# Patient Record
Sex: Female | Born: 1935
Health system: Southern US, Community
[De-identification: ages and names within clinical notes are randomized; demographics above are authoritative.]

## PROBLEM LIST (undated history)

## (undated) DIAGNOSIS — J479 Bronchiectasis, uncomplicated: Secondary | ICD-10-CM

## (undated) DIAGNOSIS — Z923 Personal history of irradiation: Secondary | ICD-10-CM

## (undated) DIAGNOSIS — D3A090 Benign carcinoid tumor of the bronchus and lung: Secondary | ICD-10-CM

## (undated) DIAGNOSIS — C50919 Malignant neoplasm of unspecified site of unspecified female breast: Secondary | ICD-10-CM

## (undated) DIAGNOSIS — R569 Unspecified convulsions: Secondary | ICD-10-CM

## (undated) DIAGNOSIS — K635 Polyp of colon: Secondary | ICD-10-CM

## (undated) DIAGNOSIS — K279 Peptic ulcer, site unspecified, unspecified as acute or chronic, without hemorrhage or perforation: Secondary | ICD-10-CM

## (undated) DIAGNOSIS — K649 Unspecified hemorrhoids: Secondary | ICD-10-CM

## (undated) DIAGNOSIS — Z9221 Personal history of antineoplastic chemotherapy: Secondary | ICD-10-CM

## (undated) DIAGNOSIS — J45909 Unspecified asthma, uncomplicated: Secondary | ICD-10-CM

## (undated) DIAGNOSIS — K219 Gastro-esophageal reflux disease without esophagitis: Secondary | ICD-10-CM

## (undated) DIAGNOSIS — I10 Essential (primary) hypertension: Secondary | ICD-10-CM

## (undated) HISTORY — DX: Unspecified asthma, uncomplicated: J45.909

## (undated) HISTORY — DX: Polyp of colon: K63.5

## (undated) HISTORY — DX: Peptic ulcer, site unspecified, unspecified as acute or chronic, without hemorrhage or perforation: K27.9

## (undated) HISTORY — PX: TOTAL VAGINAL HYSTERECTOMY: SHX2548

## (undated) HISTORY — DX: Unspecified convulsions: R56.9

## (undated) HISTORY — PX: VARICOSE VEIN SURGERY: SHX832

## (undated) HISTORY — DX: Unspecified hemorrhoids: K64.9

## (undated) HISTORY — DX: Gastro-esophageal reflux disease without esophagitis: K21.9

## (undated) HISTORY — DX: Essential (primary) hypertension: I10

## (undated) HISTORY — DX: Benign carcinoid tumor of the bronchus and lung: D3A.090

## (undated) HISTORY — DX: Malignant neoplasm of unspecified site of unspecified female breast: C50.919

## (undated) HISTORY — PX: HEMICOLECTOMY: SHX854

## (undated) HISTORY — PX: HEMORRHOID SURGERY: SHX153

---

## 1997-07-01 HISTORY — PX: THORACOTOMY/LOBECTOMY: SHX6116

## 1999-07-02 DIAGNOSIS — D3A090 Benign carcinoid tumor of the bronchus and lung: Secondary | ICD-10-CM

## 1999-07-02 HISTORY — DX: Benign carcinoid tumor of the bronchus and lung: D3A.090

## 2000-07-01 DIAGNOSIS — K635 Polyp of colon: Secondary | ICD-10-CM

## 2000-07-01 HISTORY — DX: Polyp of colon: K63.5

## 2001-07-01 DIAGNOSIS — Z923 Personal history of irradiation: Secondary | ICD-10-CM

## 2001-07-01 DIAGNOSIS — C50919 Malignant neoplasm of unspecified site of unspecified female breast: Secondary | ICD-10-CM

## 2001-07-01 HISTORY — PX: BREAST EXCISIONAL BIOPSY: SUR124

## 2001-07-01 HISTORY — PX: BREAST LUMPECTOMY: SHX2

## 2001-07-01 HISTORY — DX: Personal history of irradiation: Z92.3

## 2001-07-01 HISTORY — DX: Malignant neoplasm of unspecified site of unspecified female breast: C50.919

## 2002-07-01 DIAGNOSIS — Z9221 Personal history of antineoplastic chemotherapy: Secondary | ICD-10-CM

## 2002-07-01 HISTORY — PX: BREAST EXCISIONAL BIOPSY: SUR124

## 2002-07-01 HISTORY — PX: BREAST LUMPECTOMY: SHX2

## 2002-07-01 HISTORY — DX: Personal history of antineoplastic chemotherapy: Z92.21

## 2003-07-03 LAB — HM COLONOSCOPY: HM Colonoscopy: NORMAL

## 2013-04-11 ENCOUNTER — Ambulatory Visit: Payer: Self-pay | Admitting: Internal Medicine

## 2013-04-19 ENCOUNTER — Ambulatory Visit: Payer: Self-pay | Admitting: Physician Assistant

## 2013-04-19 LAB — URINALYSIS, COMPLETE
Bilirubin,UR: NEGATIVE
Glucose,UR: NEGATIVE mg/dL (ref 0–75)
Ketone: NEGATIVE
Ph: 6.5 (ref 4.5–8.0)
WBC UR: >30 /HPF (ref 0–5)

## 2013-05-10 ENCOUNTER — Ambulatory Visit: Payer: Self-pay | Admitting: Internal Medicine

## 2013-06-30 ENCOUNTER — Ambulatory Visit: Payer: Self-pay | Admitting: Hematology and Oncology

## 2013-07-05 ENCOUNTER — Ambulatory Visit: Payer: Self-pay | Admitting: Hematology and Oncology

## 2013-07-05 LAB — COMPREHENSIVE METABOLIC PANEL
Albumin: 3.7 g/dL (ref 3.4–5.0)
Alkaline Phosphatase: 97 U/L
Anion Gap: 9 (ref 7–16)
BUN: 15 mg/dL (ref 7–18)
Bilirubin,Total: 0.5 mg/dL (ref 0.2–1.0)
CHLORIDE: 100 mmol/L (ref 98–107)
CREATININE: 1.14 mg/dL (ref 0.60–1.30)
Calcium, Total: 9.9 mg/dL (ref 8.5–10.1)
Co2: 30 mmol/L (ref 21–32)
GFR CALC AF AMER: 50 — AB
GFR CALC NON AF AMER: 44 — AB
GLUCOSE: 118 mg/dL — AB (ref 65–99)
Osmolality: 279 (ref 275–301)
Potassium: 4.2 mmol/L (ref 3.5–5.1)
SGOT(AST): 31 U/L (ref 15–37)
SGPT (ALT): 39 U/L (ref 12–78)
SODIUM: 139 mmol/L (ref 136–145)
Total Protein: 8.1 g/dL (ref 6.4–8.2)

## 2013-07-05 LAB — CBC CANCER CENTER
BASOS ABS: 0.1 x10 3/mm (ref 0.0–0.1)
BASOS PCT: 0.9 %
Eosinophil #: 0.3 x10 3/mm (ref 0.0–0.7)
Eosinophil %: 5.9 %
HCT: 39.7 % (ref 35.0–47.0)
HGB: 12.8 g/dL (ref 12.0–16.0)
LYMPHS PCT: 31.5 %
Lymphocyte #: 1.7 x10 3/mm (ref 1.0–3.6)
MCH: 25 pg — AB (ref 26.0–34.0)
MCHC: 32.3 g/dL (ref 32.0–36.0)
MCV: 77 fL — AB (ref 80–100)
Monocyte #: 0.4 x10 3/mm (ref 0.2–0.9)
Monocyte %: 7.8 %
NEUTROS PCT: 53.9 %
Neutrophil #: 2.9 x10 3/mm (ref 1.4–6.5)
Platelet: 216 x10 3/mm (ref 150–440)
RBC: 5.15 10*6/uL (ref 3.80–5.20)
RDW: 15.1 % — AB (ref 11.5–14.5)
WBC: 5.4 x10 3/mm (ref 3.6–11.0)

## 2013-07-06 LAB — CANCER ANTIGEN 27.29: CA 27.29: 19 U/mL (ref 0.0–38.6)

## 2013-08-01 ENCOUNTER — Ambulatory Visit: Payer: Self-pay | Admitting: Hematology and Oncology

## 2013-08-09 ENCOUNTER — Ambulatory Visit: Payer: Self-pay | Admitting: Hematology and Oncology

## 2013-08-09 LAB — HM MAMMOGRAPHY: HM Mammogram: NEGATIVE

## 2013-08-29 ENCOUNTER — Ambulatory Visit: Payer: Self-pay | Admitting: Hematology and Oncology

## 2013-09-03 ENCOUNTER — Ambulatory Visit: Payer: Self-pay | Admitting: Family Medicine

## 2013-10-12 DIAGNOSIS — H1045 Other chronic allergic conjunctivitis: Secondary | ICD-10-CM | POA: Diagnosis not present

## 2013-10-13 DIAGNOSIS — K649 Unspecified hemorrhoids: Secondary | ICD-10-CM | POA: Diagnosis not present

## 2013-10-18 DIAGNOSIS — Z5181 Encounter for therapeutic drug level monitoring: Secondary | ICD-10-CM | POA: Diagnosis not present

## 2013-10-19 DIAGNOSIS — R569 Unspecified convulsions: Secondary | ICD-10-CM | POA: Diagnosis not present

## 2013-11-04 DIAGNOSIS — Z5181 Encounter for therapeutic drug level monitoring: Secondary | ICD-10-CM | POA: Diagnosis not present

## 2013-11-04 DIAGNOSIS — K644 Residual hemorrhoidal skin tags: Secondary | ICD-10-CM | POA: Diagnosis not present

## 2013-11-04 DIAGNOSIS — J45909 Unspecified asthma, uncomplicated: Secondary | ICD-10-CM | POA: Diagnosis not present

## 2013-11-04 DIAGNOSIS — Z7901 Long term (current) use of anticoagulants: Secondary | ICD-10-CM | POA: Diagnosis not present

## 2013-11-11 DIAGNOSIS — J45901 Unspecified asthma with (acute) exacerbation: Secondary | ICD-10-CM | POA: Diagnosis not present

## 2013-12-14 DIAGNOSIS — Z5181 Encounter for therapeutic drug level monitoring: Secondary | ICD-10-CM | POA: Diagnosis not present

## 2013-12-15 DIAGNOSIS — H04129 Dry eye syndrome of unspecified lacrimal gland: Secondary | ICD-10-CM | POA: Diagnosis not present

## 2013-12-23 DIAGNOSIS — J45901 Unspecified asthma with (acute) exacerbation: Secondary | ICD-10-CM | POA: Diagnosis not present

## 2013-12-23 DIAGNOSIS — J40 Bronchitis, not specified as acute or chronic: Secondary | ICD-10-CM | POA: Diagnosis not present

## 2013-12-23 DIAGNOSIS — H04129 Dry eye syndrome of unspecified lacrimal gland: Secondary | ICD-10-CM | POA: Diagnosis not present

## 2013-12-23 DIAGNOSIS — I2699 Other pulmonary embolism without acute cor pulmonale: Secondary | ICD-10-CM | POA: Diagnosis not present

## 2013-12-23 DIAGNOSIS — R059 Cough, unspecified: Secondary | ICD-10-CM | POA: Diagnosis not present

## 2013-12-23 DIAGNOSIS — J449 Chronic obstructive pulmonary disease, unspecified: Secondary | ICD-10-CM | POA: Diagnosis not present

## 2013-12-23 DIAGNOSIS — R05 Cough: Secondary | ICD-10-CM | POA: Diagnosis not present

## 2013-12-28 ENCOUNTER — Encounter: Payer: Self-pay | Admitting: Neurology

## 2013-12-28 DIAGNOSIS — M6281 Muscle weakness (generalized): Secondary | ICD-10-CM | POA: Diagnosis not present

## 2013-12-28 DIAGNOSIS — R293 Abnormal posture: Secondary | ICD-10-CM | POA: Diagnosis not present

## 2013-12-28 DIAGNOSIS — R262 Difficulty in walking, not elsewhere classified: Secondary | ICD-10-CM | POA: Diagnosis not present

## 2013-12-29 ENCOUNTER — Encounter: Payer: Self-pay | Admitting: Neurology

## 2014-01-05 DIAGNOSIS — Z5181 Encounter for therapeutic drug level monitoring: Secondary | ICD-10-CM | POA: Diagnosis not present

## 2014-01-11 DIAGNOSIS — I2699 Other pulmonary embolism without acute cor pulmonale: Secondary | ICD-10-CM | POA: Diagnosis not present

## 2014-01-11 DIAGNOSIS — I10 Essential (primary) hypertension: Secondary | ICD-10-CM | POA: Diagnosis not present

## 2014-01-11 LAB — BASIC METABOLIC PANEL
BUN: 12 mg/dL (ref 4–21)
Creatinine: 0.7 mg/dL (ref <=1.1)

## 2014-02-01 DIAGNOSIS — R05 Cough: Secondary | ICD-10-CM | POA: Diagnosis not present

## 2014-02-01 DIAGNOSIS — R9389 Abnormal findings on diagnostic imaging of other specified body structures: Secondary | ICD-10-CM | POA: Diagnosis not present

## 2014-02-01 DIAGNOSIS — J449 Chronic obstructive pulmonary disease, unspecified: Secondary | ICD-10-CM | POA: Diagnosis not present

## 2014-02-01 DIAGNOSIS — R059 Cough, unspecified: Secondary | ICD-10-CM | POA: Diagnosis not present

## 2014-02-01 DIAGNOSIS — J45909 Unspecified asthma, uncomplicated: Secondary | ICD-10-CM | POA: Diagnosis not present

## 2014-02-01 DIAGNOSIS — J310 Chronic rhinitis: Secondary | ICD-10-CM | POA: Diagnosis not present

## 2014-02-15 DIAGNOSIS — L820 Inflamed seborrheic keratosis: Secondary | ICD-10-CM | POA: Diagnosis not present

## 2014-02-22 DIAGNOSIS — H832X9 Labyrinthine dysfunction, unspecified ear: Secondary | ICD-10-CM | POA: Diagnosis not present

## 2014-03-01 DIAGNOSIS — I8390 Asymptomatic varicose veins of unspecified lower extremity: Secondary | ICD-10-CM | POA: Diagnosis not present

## 2014-03-02 DIAGNOSIS — L820 Inflamed seborrheic keratosis: Secondary | ICD-10-CM | POA: Diagnosis not present

## 2014-03-09 DIAGNOSIS — B351 Tinea unguium: Secondary | ICD-10-CM | POA: Diagnosis not present

## 2014-03-21 DIAGNOSIS — H6120 Impacted cerumen, unspecified ear: Secondary | ICD-10-CM | POA: Diagnosis not present

## 2014-03-21 DIAGNOSIS — H903 Sensorineural hearing loss, bilateral: Secondary | ICD-10-CM | POA: Diagnosis not present

## 2014-03-29 DIAGNOSIS — L820 Inflamed seborrheic keratosis: Secondary | ICD-10-CM | POA: Diagnosis not present

## 2014-03-30 ENCOUNTER — Ambulatory Visit: Payer: Self-pay

## 2014-03-30 DIAGNOSIS — I10 Essential (primary) hypertension: Secondary | ICD-10-CM | POA: Diagnosis not present

## 2014-03-30 DIAGNOSIS — Z86718 Personal history of other venous thrombosis and embolism: Secondary | ICD-10-CM | POA: Diagnosis not present

## 2014-03-30 DIAGNOSIS — J45909 Unspecified asthma, uncomplicated: Secondary | ICD-10-CM | POA: Diagnosis not present

## 2014-03-30 DIAGNOSIS — Z79899 Other long term (current) drug therapy: Secondary | ICD-10-CM | POA: Diagnosis not present

## 2014-03-30 DIAGNOSIS — R51 Headache: Secondary | ICD-10-CM | POA: Diagnosis not present

## 2014-03-30 DIAGNOSIS — R569 Unspecified convulsions: Secondary | ICD-10-CM | POA: Diagnosis not present

## 2014-03-30 DIAGNOSIS — Z7901 Long term (current) use of anticoagulants: Secondary | ICD-10-CM | POA: Diagnosis not present

## 2014-03-30 LAB — COMPREHENSIVE METABOLIC PANEL
ALBUMIN: 3.5 g/dL (ref 3.4–5.0)
ALK PHOS: 94 U/L
ANION GAP: 9 (ref 7–16)
BUN: 19 mg/dL — AB (ref 7–18)
Bilirubin,Total: 0.3 mg/dL (ref 0.2–1.0)
CALCIUM: 9.4 mg/dL (ref 8.5–10.1)
CHLORIDE: 99 mmol/L (ref 98–107)
Co2: 29 mmol/L (ref 21–32)
Creatinine: 1.26 mg/dL (ref 0.60–1.30)
EGFR (African American): 52 — ABNORMAL LOW
EGFR (Non-African Amer.): 43 — ABNORMAL LOW
GLUCOSE: 113 mg/dL — AB (ref 65–99)
Osmolality: 277 (ref 275–301)
POTASSIUM: 3.5 mmol/L (ref 3.5–5.1)
SGOT(AST): 24 U/L (ref 15–37)
SGPT (ALT): 30 U/L
SODIUM: 137 mmol/L (ref 136–145)
TOTAL PROTEIN: 8.1 g/dL (ref 6.4–8.2)

## 2014-03-30 LAB — SEDIMENTATION RATE: Erythrocyte Sed Rate: 55 mm/hr — ABNORMAL HIGH (ref 0–30)

## 2014-03-30 LAB — CBC WITH DIFFERENTIAL/PLATELET
BASOS ABS: 0 10*3/uL (ref 0.0–0.1)
BASOS PCT: 0.9 %
EOS ABS: 0.3 10*3/uL (ref 0.0–0.7)
Eosinophil %: 5.5 %
HCT: 39.7 % (ref 35.0–47.0)
HGB: 12.8 g/dL (ref 12.0–16.0)
LYMPHS ABS: 1.8 10*3/uL (ref 1.0–3.6)
Lymphocyte %: 36.7 %
MCH: 25.4 pg — AB (ref 26.0–34.0)
MCHC: 32.3 g/dL (ref 32.0–36.0)
MCV: 79 fL — ABNORMAL LOW (ref 80–100)
MONOS PCT: 8 %
Monocyte #: 0.4 x10 3/mm (ref 0.2–0.9)
Neutrophil #: 2.4 10*3/uL (ref 1.4–6.5)
Neutrophil %: 48.9 %
PLATELETS: 221 10*3/uL (ref 150–440)
RBC: 5.06 10*6/uL (ref 3.80–5.20)
RDW: 15 % — ABNORMAL HIGH (ref 11.5–14.5)
WBC: 4.9 10*3/uL (ref 3.6–11.0)

## 2014-03-31 ENCOUNTER — Ambulatory Visit: Payer: Self-pay

## 2014-03-31 DIAGNOSIS — R51 Headache: Secondary | ICD-10-CM | POA: Diagnosis not present

## 2014-03-31 DIAGNOSIS — G319 Degenerative disease of nervous system, unspecified: Secondary | ICD-10-CM | POA: Diagnosis not present

## 2014-04-12 DIAGNOSIS — G40209 Localization-related (focal) (partial) symptomatic epilepsy and epileptic syndromes with complex partial seizures, not intractable, without status epilepticus: Secondary | ICD-10-CM | POA: Diagnosis not present

## 2014-04-12 DIAGNOSIS — R262 Difficulty in walking, not elsewhere classified: Secondary | ICD-10-CM | POA: Diagnosis not present

## 2014-05-06 DIAGNOSIS — J40 Bronchitis, not specified as acute or chronic: Secondary | ICD-10-CM | POA: Diagnosis not present

## 2014-05-24 DIAGNOSIS — R05 Cough: Secondary | ICD-10-CM | POA: Diagnosis not present

## 2014-05-24 DIAGNOSIS — E6609 Other obesity due to excess calories: Secondary | ICD-10-CM | POA: Diagnosis not present

## 2014-05-24 DIAGNOSIS — J4520 Mild intermittent asthma, uncomplicated: Secondary | ICD-10-CM | POA: Diagnosis not present

## 2014-05-24 DIAGNOSIS — J8410 Pulmonary fibrosis, unspecified: Secondary | ICD-10-CM | POA: Diagnosis not present

## 2014-05-24 DIAGNOSIS — R0781 Pleurodynia: Secondary | ICD-10-CM | POA: Diagnosis not present

## 2014-06-02 DIAGNOSIS — Z23 Encounter for immunization: Secondary | ICD-10-CM | POA: Diagnosis not present

## 2014-06-08 ENCOUNTER — Encounter: Payer: Self-pay | Admitting: Internal Medicine

## 2014-06-08 DIAGNOSIS — H832X3 Labyrinthine dysfunction, bilateral: Secondary | ICD-10-CM | POA: Diagnosis not present

## 2014-06-08 DIAGNOSIS — R269 Unspecified abnormalities of gait and mobility: Secondary | ICD-10-CM | POA: Diagnosis not present

## 2014-06-08 DIAGNOSIS — R262 Difficulty in walking, not elsewhere classified: Secondary | ICD-10-CM | POA: Diagnosis not present

## 2014-06-08 DIAGNOSIS — Z9181 History of falling: Secondary | ICD-10-CM | POA: Diagnosis not present

## 2014-07-01 ENCOUNTER — Encounter: Payer: Self-pay | Admitting: Internal Medicine

## 2014-07-22 ENCOUNTER — Emergency Department: Payer: Self-pay | Admitting: Emergency Medicine

## 2014-07-22 DIAGNOSIS — M25561 Pain in right knee: Secondary | ICD-10-CM | POA: Diagnosis not present

## 2014-07-22 DIAGNOSIS — M79661 Pain in right lower leg: Secondary | ICD-10-CM | POA: Diagnosis not present

## 2014-07-22 DIAGNOSIS — M170 Bilateral primary osteoarthritis of knee: Secondary | ICD-10-CM | POA: Diagnosis not present

## 2014-07-22 DIAGNOSIS — S8001XA Contusion of right knee, initial encounter: Secondary | ICD-10-CM | POA: Diagnosis not present

## 2014-07-22 DIAGNOSIS — M1711 Unilateral primary osteoarthritis, right knee: Secondary | ICD-10-CM | POA: Diagnosis not present

## 2014-07-22 DIAGNOSIS — S8991XA Unspecified injury of right lower leg, initial encounter: Secondary | ICD-10-CM | POA: Diagnosis not present

## 2014-07-27 DIAGNOSIS — L728 Other follicular cysts of the skin and subcutaneous tissue: Secondary | ICD-10-CM | POA: Diagnosis not present

## 2014-07-27 DIAGNOSIS — L723 Sebaceous cyst: Secondary | ICD-10-CM | POA: Diagnosis not present

## 2014-07-27 DIAGNOSIS — D485 Neoplasm of uncertain behavior of skin: Secondary | ICD-10-CM | POA: Diagnosis not present

## 2014-07-27 DIAGNOSIS — L821 Other seborrheic keratosis: Secondary | ICD-10-CM | POA: Diagnosis not present

## 2014-08-11 DIAGNOSIS — G40209 Localization-related (focal) (partial) symptomatic epilepsy and epileptic syndromes with complex partial seizures, not intractable, without status epilepticus: Secondary | ICD-10-CM | POA: Diagnosis not present

## 2014-08-17 ENCOUNTER — Ambulatory Visit: Payer: Self-pay | Admitting: Hematology and Oncology

## 2014-08-17 DIAGNOSIS — G40909 Epilepsy, unspecified, not intractable, without status epilepticus: Secondary | ICD-10-CM | POA: Diagnosis not present

## 2014-08-17 DIAGNOSIS — I10 Essential (primary) hypertension: Secondary | ICD-10-CM | POA: Diagnosis not present

## 2014-08-17 DIAGNOSIS — Z86718 Personal history of other venous thrombosis and embolism: Secondary | ICD-10-CM | POA: Diagnosis not present

## 2014-08-17 DIAGNOSIS — R5383 Other fatigue: Secondary | ICD-10-CM | POA: Diagnosis not present

## 2014-08-17 DIAGNOSIS — G629 Polyneuropathy, unspecified: Secondary | ICD-10-CM | POA: Diagnosis not present

## 2014-08-17 DIAGNOSIS — M4800 Spinal stenosis, site unspecified: Secondary | ICD-10-CM | POA: Diagnosis not present

## 2014-08-17 DIAGNOSIS — Z9223 Personal history of estrogen therapy: Secondary | ICD-10-CM | POA: Diagnosis not present

## 2014-08-17 DIAGNOSIS — Z853 Personal history of malignant neoplasm of breast: Secondary | ICD-10-CM | POA: Diagnosis not present

## 2014-08-17 DIAGNOSIS — R05 Cough: Secondary | ICD-10-CM | POA: Diagnosis not present

## 2014-08-17 DIAGNOSIS — R531 Weakness: Secondary | ICD-10-CM | POA: Diagnosis not present

## 2014-08-17 DIAGNOSIS — Z85110 Personal history of malignant carcinoid tumor of bronchus and lung: Secondary | ICD-10-CM | POA: Diagnosis not present

## 2014-08-17 DIAGNOSIS — Z923 Personal history of irradiation: Secondary | ICD-10-CM | POA: Diagnosis not present

## 2014-08-17 DIAGNOSIS — Z170 Estrogen receptor positive status [ER+]: Secondary | ICD-10-CM | POA: Diagnosis not present

## 2014-08-17 DIAGNOSIS — Z87891 Personal history of nicotine dependence: Secondary | ICD-10-CM | POA: Diagnosis not present

## 2014-08-17 DIAGNOSIS — Z7901 Long term (current) use of anticoagulants: Secondary | ICD-10-CM | POA: Diagnosis not present

## 2014-08-17 DIAGNOSIS — Z86711 Personal history of pulmonary embolism: Secondary | ICD-10-CM | POA: Diagnosis not present

## 2014-08-17 DIAGNOSIS — Z79899 Other long term (current) drug therapy: Secondary | ICD-10-CM | POA: Diagnosis not present

## 2014-08-20 ENCOUNTER — Ambulatory Visit: Payer: Self-pay

## 2014-08-20 DIAGNOSIS — G40909 Epilepsy, unspecified, not intractable, without status epilepticus: Secondary | ICD-10-CM | POA: Diagnosis not present

## 2014-08-20 DIAGNOSIS — R319 Hematuria, unspecified: Secondary | ICD-10-CM | POA: Diagnosis not present

## 2014-08-20 DIAGNOSIS — N390 Urinary tract infection, site not specified: Secondary | ICD-10-CM | POA: Diagnosis not present

## 2014-08-20 DIAGNOSIS — Z79899 Other long term (current) drug therapy: Secondary | ICD-10-CM | POA: Diagnosis not present

## 2014-08-20 DIAGNOSIS — I10 Essential (primary) hypertension: Secondary | ICD-10-CM | POA: Diagnosis not present

## 2014-08-20 DIAGNOSIS — K279 Peptic ulcer, site unspecified, unspecified as acute or chronic, without hemorrhage or perforation: Secondary | ICD-10-CM | POA: Diagnosis not present

## 2014-08-30 ENCOUNTER — Ambulatory Visit: Payer: Self-pay | Admitting: Hematology and Oncology

## 2014-08-30 ENCOUNTER — Ambulatory Visit
Admit: 2014-08-30 | Disposition: A | Discharge status: Home / Self Care | Payer: Self-pay | Attending: Hematology and Oncology | Admitting: Hematology and Oncology

## 2014-08-30 DIAGNOSIS — Z1231 Encounter for screening mammogram for malignant neoplasm of breast: Secondary | ICD-10-CM | POA: Diagnosis not present

## 2014-09-08 ENCOUNTER — Ambulatory Visit: Payer: Self-pay | Admitting: Internal Medicine

## 2014-09-08 DIAGNOSIS — M25561 Pain in right knee: Secondary | ICD-10-CM | POA: Diagnosis not present

## 2014-09-08 DIAGNOSIS — M25461 Effusion, right knee: Secondary | ICD-10-CM | POA: Diagnosis not present

## 2014-09-08 DIAGNOSIS — M1711 Unilateral primary osteoarthritis, right knee: Secondary | ICD-10-CM | POA: Diagnosis not present

## 2014-10-11 ENCOUNTER — Ambulatory Visit
Admit: 2014-10-11 | Disposition: A | Discharge status: Home / Self Care | Payer: Self-pay | Attending: Family Medicine | Admitting: Family Medicine

## 2014-10-11 DIAGNOSIS — K529 Noninfective gastroenteritis and colitis, unspecified: Secondary | ICD-10-CM | POA: Diagnosis not present

## 2014-10-11 DIAGNOSIS — G40909 Epilepsy, unspecified, not intractable, without status epilepticus: Secondary | ICD-10-CM | POA: Diagnosis not present

## 2014-10-11 DIAGNOSIS — I10 Essential (primary) hypertension: Secondary | ICD-10-CM | POA: Diagnosis not present

## 2014-10-11 LAB — BASIC METABOLIC PANEL
Anion Gap: 10 (ref 7–16)
BUN: 26 mg/dL — AB
CHLORIDE: 104 mmol/L
Calcium, Total: 9.5 mg/dL
Co2: 24 mmol/L
Creatinine: 1.13 mg/dL — ABNORMAL HIGH
EGFR (Non-African Amer.): 43 — ABNORMAL LOW
GFR CALC AF AMER: 50 — AB
GLUCOSE: 145 mg/dL — AB
Potassium: 4.3 mmol/L
Sodium: 138 mmol/L

## 2014-11-08 ENCOUNTER — Encounter: Payer: Self-pay | Admitting: Internal Medicine

## 2014-11-08 DIAGNOSIS — I6789 Other cerebrovascular disease: Secondary | ICD-10-CM

## 2014-11-08 DIAGNOSIS — G40909 Epilepsy, unspecified, not intractable, without status epilepticus: Secondary | ICD-10-CM | POA: Insufficient documentation

## 2014-11-08 DIAGNOSIS — J4520 Mild intermittent asthma, uncomplicated: Secondary | ICD-10-CM | POA: Insufficient documentation

## 2014-11-08 DIAGNOSIS — I8390 Asymptomatic varicose veins of unspecified lower extremity: Secondary | ICD-10-CM | POA: Insufficient documentation

## 2014-11-08 DIAGNOSIS — M25569 Pain in unspecified knee: Secondary | ICD-10-CM | POA: Insufficient documentation

## 2014-11-08 DIAGNOSIS — Z853 Personal history of malignant neoplasm of breast: Secondary | ICD-10-CM | POA: Insufficient documentation

## 2014-11-08 DIAGNOSIS — I10 Essential (primary) hypertension: Secondary | ICD-10-CM | POA: Insufficient documentation

## 2014-11-08 DIAGNOSIS — J479 Bronchiectasis, uncomplicated: Secondary | ICD-10-CM | POA: Insufficient documentation

## 2014-11-08 DIAGNOSIS — I2699 Other pulmonary embolism without acute cor pulmonale: Secondary | ICD-10-CM | POA: Insufficient documentation

## 2014-11-08 DIAGNOSIS — R2689 Other abnormalities of gait and mobility: Secondary | ICD-10-CM | POA: Insufficient documentation

## 2014-11-08 DIAGNOSIS — Z85110 Personal history of malignant carcinoid tumor of bronchus and lung: Secondary | ICD-10-CM | POA: Insufficient documentation

## 2014-11-08 DIAGNOSIS — Z8711 Personal history of peptic ulcer disease: Secondary | ICD-10-CM | POA: Insufficient documentation

## 2014-11-11 DIAGNOSIS — J4530 Mild persistent asthma, uncomplicated: Secondary | ICD-10-CM | POA: Diagnosis not present

## 2014-11-11 DIAGNOSIS — R918 Other nonspecific abnormal finding of lung field: Secondary | ICD-10-CM | POA: Diagnosis not present

## 2014-11-11 DIAGNOSIS — K219 Gastro-esophageal reflux disease without esophagitis: Secondary | ICD-10-CM | POA: Diagnosis not present

## 2014-11-11 DIAGNOSIS — J310 Chronic rhinitis: Secondary | ICD-10-CM | POA: Diagnosis not present

## 2014-12-12 ENCOUNTER — Ambulatory Visit: Payer: Self-pay | Admitting: Internal Medicine

## 2014-12-13 ENCOUNTER — Telehealth: Payer: Self-pay

## 2014-12-13 MED ORDER — HYDROCORTISONE 1 % EX CREA: 1.0000 "application " | TOPICAL_CREAM | Freq: Two times a day (BID) | CUTANEOUS | Status: DC

## 2014-12-13 NOTE — Telephone Encounter (Signed)
Rx sent to pharmacy   

## 2014-12-13 NOTE — Telephone Encounter (Signed)
Patient needs cortisone cream for hemorrhoid to pharmacy

## 2015-01-10 DIAGNOSIS — R32 Unspecified urinary incontinence: Secondary | ICD-10-CM | POA: Diagnosis not present

## 2015-01-10 DIAGNOSIS — K649 Unspecified hemorrhoids: Secondary | ICD-10-CM | POA: Diagnosis not present

## 2015-01-10 DIAGNOSIS — R159 Full incontinence of feces: Secondary | ICD-10-CM | POA: Diagnosis not present

## 2015-01-16 ENCOUNTER — Ambulatory Visit (INDEPENDENT_AMBULATORY_CARE_PROVIDER_SITE_OTHER): Payer: Medicare Other | Admitting: Internal Medicine

## 2015-01-16 ENCOUNTER — Encounter: Payer: Self-pay | Admitting: Internal Medicine

## 2015-01-16 VITALS — BP 120/64 | HR 76 | Ht 63.5 in | Wt 175.6 lb

## 2015-01-16 DIAGNOSIS — R300 Dysuria: Secondary | ICD-10-CM | POA: Diagnosis not present

## 2015-01-16 LAB — POC URINALYSIS WITH MICROSCOPIC (NON AUTO)MANUAL RESULT
Bilirubin, UA: NEGATIVE
Blood, UA: NEGATIVE
Crystals: 0
Epithelial cells, urine per micros: 0
Glucose, UA: NEGATIVE
KETONES UA: NEGATIVE
Mucus, UA: 0
Nitrite, UA: NEGATIVE
Protein, UA: NEGATIVE
Spec Grav, UA: 1.01
UROBILINOGEN UA: 0.2
WBC CASTS UA: 15
pH, UA: 7

## 2015-01-16 MED ORDER — CEFUROXIME AXETIL 250 MG PO TABS: 250.0000 mg | ORAL_TABLET | Freq: Two times a day (BID) | ORAL | Status: DC

## 2015-01-16 NOTE — Progress Notes (Signed)
Date:  01/16/2015   Name:  Rebecca Wolf   DOB:  06-21-1925   MRN:  [id number]   Chief Complaint: Urinary Tract Infection Urinary Tract Infection  This is a new problem. The current episode started in the past 7 days. The problem occurs every urination. The problem has been unchanged. The pain is mild. There has been no fever. Associated symptoms include frequency, nausea, sweats, urgency and vomiting. Pertinent negatives include no chills, discharge, flank pain or hematuria. Treatments tried: given macrodantin by GI but it caused diarrhea. The treatment provided no relief.     Review of Systems:  Review of Systems  Constitutional: Negative for fever, chills and fatigue.  Gastrointestinal: Positive for nausea and vomiting. Negative for abdominal pain and abdominal distention.  Genitourinary: Positive for dysuria, urgency and frequency. Negative for hematuria and flank pain.    Patient Active Problem List   Diagnosis Date Noted  . Imbalance 11/08/2014  . Bronchiectasis 11/08/2014  . Essential (primary) hypertension 11/08/2014  . Personal history of malignant neoplasm of breast 11/08/2014  . History of malignant neoplasm of thoracic cavity structure 11/08/2014  . H/O peptic ulcer 11/08/2014  . Asthma, mild intermittent 11/08/2014  . PE (pulmonary embolism) 11/08/2014  . Gonalgia 11/08/2014  . Cerebral seizure 11/08/2014  . Leg varices 11/08/2014  . Complex partial epilepsy 04/12/2014    Prior to Admission medications   Medication Sig Start Date End Date Taking? Authorizing Provider  albuterol (ACCUNEB) 1.25 MG/3ML nebulizer solution Inhale 1 ampule into the lungs 4 (four) times daily as needed. 05/06/14  Yes Historical Provider, MD  albuterol (PROVENTIL HFA;VENTOLIN HFA) 108 (90 BASE) MCG/ACT inhaler Inhale 1 puff into the lungs 4 (four) times daily.   Yes Historical Provider, MD  amLODipine (NORVASC) 5 MG tablet Take 1 tablet by mouth daily. 04/04/14  Yes Historical Provider, MD   apixaban (ELIQUIS) 2.5 MG TABS tablet Take 1 tablet by mouth 2 (two) times daily. 07/18/14  Yes Historical Provider, MD  fluticasone (FLONASE) 50 MCG/ACT nasal spray Place 2 sprays into the nose daily.   Yes Historical Provider, MD  Fluticasone-Salmeterol (ADVAIR) 500-50 MCG/DOSE AEPB Inhale 1 puff into the lungs 2 (two) times daily.   Yes Historical Provider, MD  hydrocortisone cream 1 % Apply 1 application topically 2 (two) times daily. 12/13/14  Yes Glean Hess, MD  levETIRAcetam (KEPPRA) 750 MG tablet Take 1 tablet by mouth 2 (two) times daily. 06/29/13  Yes Historical Provider, MD  montelukast (SINGULAIR) 10 MG tablet Take 1 tablet by mouth daily.   Yes Historical Provider, MD  pantoprazole (PROTONIX) 40 MG tablet Take 1 tablet by mouth daily. 07/17/14  Yes Historical Provider, MD  spironolactone-hydrochlorothiazide (ALDACTAZIDE) 25-25 MG per tablet Take 1 tablet by mouth daily. 08/04/14  Yes Historical Provider, MD  Olopatadine HCl 0.6 % SOLN Place 1 spray into the nose daily.    Historical Provider, MD    Allergies  Allergen Reactions  . Levofloxacin   . Sulfa Antibiotics     Past Surgical History  Procedure Laterality Date  . Thoracotomy/lobectomy  1999    carcinoid tumor  . Hemicolectomy Right     bowel obstruction  . Total vaginal hysterectomy    . Varicose vein surgery    . Hemorrhoid surgery    . Breast lumpectomy Left 2004/2005    cancer    History  Substance Use Topics  . Smoking status: Never Smoker   . Smokeless tobacco: Not on file  . Alcohol Use: Not  on file     Medication list has been reviewed and updated.  Physical Examination:  Physical Exam  Constitutional: She appears well-developed and well-nourished. No distress.  Cardiovascular: Normal rate and regular rhythm.   Pulmonary/Chest: Effort normal and breath sounds normal. She has no wheezes.  Abdominal: Soft. There is no tenderness. There is no rebound and no guarding.    BP 120/64 mmHg   Pulse 76  Ht 5' 3.5" (1.613 m)  Wt 175 lb 9.6 oz (79.652 kg)  BMI 30.61 kg/m2  Assessment and Plan: 1. Dysuria Continue increased fluid intake Begin ceftine Await Culture for guidance if needed - POC urinalysis w microscopic (non auto) - Urine Culture - cefUROXime (CEFTIN) 250 MG tablet; Take 1 tablet (250 mg total) by mouth 2 (two) times daily with a meal.  Dispense: 14 tablet; Refill: 0   Halina Maidens, MD [facility]  01/16/2015

## 2015-01-18 ENCOUNTER — Other Ambulatory Visit: Payer: Self-pay | Admitting: Internal Medicine

## 2015-01-18 LAB — URINE CULTURE

## 2015-01-18 MED ORDER — AMPICILLIN 500 MG PO CAPS: 500.0000 mg | ORAL_CAPSULE | Freq: Four times a day (QID) | ORAL | Status: DC

## 2015-01-26 ENCOUNTER — Other Ambulatory Visit: Payer: Self-pay | Admitting: Internal Medicine

## 2015-01-30 DIAGNOSIS — R159 Full incontinence of feces: Secondary | ICD-10-CM | POA: Diagnosis not present

## 2015-02-06 ENCOUNTER — Other Ambulatory Visit: Payer: Self-pay

## 2015-02-06 MED ORDER — SPIRONOLACTONE-HCTZ 25-25 MG PO TABS: 1.0000 | ORAL_TABLET | Freq: Every day | ORAL | Status: DC

## 2015-02-13 ENCOUNTER — Telehealth: Payer: Self-pay

## 2015-02-13 NOTE — Telephone Encounter (Signed)
[name] thinks Aniesa has thrush, does not rinse after advair, can you send her something or does she need to be seen.dr

## 2015-02-14 ENCOUNTER — Other Ambulatory Visit: Payer: Self-pay | Admitting: Internal Medicine

## 2015-02-14 MED ORDER — NYSTATIN 100000 UNIT/ML MT SUSP: 5.0000 mL | Freq: Four times a day (QID) | OROMUCOSAL | Status: DC

## 2015-02-14 NOTE — Telephone Encounter (Signed)
Daughter informed.dr

## 2015-02-14 NOTE — Telephone Encounter (Signed)
I will send Rx for nystatin suspension.

## 2015-02-16 DIAGNOSIS — R159 Full incontinence of feces: Secondary | ICD-10-CM | POA: Diagnosis not present

## 2015-02-17 ENCOUNTER — Encounter: Payer: Self-pay | Admitting: Family Medicine

## 2015-02-17 ENCOUNTER — Ambulatory Visit (INDEPENDENT_AMBULATORY_CARE_PROVIDER_SITE_OTHER): Payer: Medicare Other | Admitting: Family Medicine

## 2015-02-17 ENCOUNTER — Other Ambulatory Visit: Payer: Self-pay | Admitting: Family Medicine

## 2015-02-17 ENCOUNTER — Ambulatory Visit: Payer: Self-pay

## 2015-02-17 ENCOUNTER — Ambulatory Visit
Admission: RE | Admit: 2015-02-17 | Discharge: 2015-02-17 | Disposition: A | Discharge status: Home / Self Care | Payer: Medicare Other | Source: Ambulatory Visit | Attending: Family Medicine | Admitting: Family Medicine

## 2015-02-17 VITALS — BP 130/78 | HR 80 | Ht 63.5 in | Wt 176.0 lb

## 2015-02-17 DIAGNOSIS — R600 Localized edema: Secondary | ICD-10-CM | POA: Diagnosis not present

## 2015-02-17 DIAGNOSIS — I10 Essential (primary) hypertension: Secondary | ICD-10-CM | POA: Diagnosis not present

## 2015-02-17 DIAGNOSIS — R2242 Localized swelling, mass and lump, left lower limb: Secondary | ICD-10-CM

## 2015-02-17 NOTE — Progress Notes (Signed)
Date:  02/17/2015   Name:  Rebecca Wolf   DOB:  Mar 31, 1925   MRN:  [id number]  PCP:  Halina Maidens, MD    Chief Complaint: Leg Swelling   History of Present Illness:  This is a 81 y.o. female with increased LLE edema past 2d, hx of LLE DVT in 2000 and 2002, switched from warfarin to Eliquis one year ago. Some chronic DOE, no CP, no hemoptysis.  Review of Systems:  Review of Systems  Patient Active Problem List   Diagnosis Date Noted  . Imbalance 11/08/2014  . Bronchiectasis 11/08/2014  . Essential (primary) hypertension 11/08/2014  . Personal history of malignant neoplasm of breast 11/08/2014  . History of malignant neoplasm of thoracic cavity structure 11/08/2014  . H/O peptic ulcer 11/08/2014  . Asthma, mild intermittent 11/08/2014  . PE (pulmonary embolism) 11/08/2014  . Gonalgia 11/08/2014  . Cerebral seizure 11/08/2014  . Leg varices 11/08/2014  . Complex partial epilepsy 04/12/2014    Prior to Admission medications   Medication Sig Start Date End Date Taking? Authorizing Provider  albuterol (ACCUNEB) 1.25 MG/3ML nebulizer solution Inhale 1 ampule into the lungs 4 (four) times daily as needed. 05/06/14  Yes Historical Provider, MD  albuterol (PROVENTIL HFA;VENTOLIN HFA) 108 (90 BASE) MCG/ACT inhaler Inhale 1 puff into the lungs 4 (four) times daily.   Yes Historical Provider, MD  amLODipine (NORVASC) 5 MG tablet Take 1 tablet by mouth daily. 04/04/14  Yes Historical Provider, MD  ELIQUIS 2.5 MG TABS tablet TAKE 1 TABLET BY MOUTH TWICE DAILY 01/27/15  Yes Glean Hess, MD  fluticasone [facility]) 50 MCG/ACT nasal spray Place 2 sprays into the nose daily.   Yes Historical Provider, MD  Fluticasone-Salmeterol (ADVAIR) 500-50 MCG/DOSE AEPB Inhale 1 puff into the lungs 2 (two) times daily.   Yes Historical Provider, MD  hydrocortisone cream 1 % Apply 1 application topically 2 (two) times daily. 12/13/14  Yes Glean Hess, MD  levETIRAcetam (KEPPRA) 750 MG tablet Take 1  tablet by mouth 2 (two) times daily. 06/29/13  Yes Historical Provider, MD  montelukast (SINGULAIR) 10 MG tablet Take 1 tablet by mouth daily.   Yes Historical Provider, MD  nystatin (MYCOSTATIN) 100000 UNIT/ML suspension Take 5 mLs (500,000 Units total) by mouth 4 (four) times daily. Swish, gargle and spit 4 times per day for one week. 02/14/15  Yes Glean Hess, MD  Olopatadine HCl 0.6 % SOLN Place 1 spray into the nose daily.   Yes Historical Provider, MD  pantoprazole (PROTONIX) 40 MG tablet Take 1 tablet by mouth daily. 07/17/14  Yes Historical Provider, MD  spironolactone-hydrochlorothiazide (ALDACTAZIDE) 25-25 MG per tablet Take 1 tablet by mouth daily. 02/06/15  Yes Glean Hess, MD    Allergies  Allergen Reactions  . Levofloxacin   . Sulfa Antibiotics     Past Surgical History  Procedure Laterality Date  . Thoracotomy/lobectomy  1999    carcinoid tumor  . Hemicolectomy Right     bowel obstruction  . Total vaginal hysterectomy    . Varicose vein surgery    . Hemorrhoid surgery    . Breast lumpectomy Left 2004/2005    cancer    Social History  Substance Use Topics  . Smoking status: Never Smoker   . Smokeless tobacco: None  . Alcohol Use: None    Family History  Problem Relation Age of Onset  . Diabetes Sister   . Alcoholism Father     Medication list has been reviewed and  updated.  Physical Examination: BP 130/78 mmHg  Pulse 80  Ht 5' 3.5" (1.613 m)  Wt 176 lb (79.833 kg)  BMI 30.68 kg/m2  SpO2 94%  Physical Exam  Cardiovascular: Normal rate, regular rhythm and normal heart sounds.   Pulmonary/Chest: Effort normal and breath sounds normal.  Musculoskeletal:  LLE 2+ edema with mild calf tenderness  RLE 1+ edema, no calf tenderness    Assessment and Plan:  1. Leg edema, left Doppler today given hx recurrent DVT - US Venous Img Lower Unilateral Left; Future  2. Essential (primary) hypertension Well controlled, continue current  regimen   Return if symptoms worsen or fail to improve.  Satira Anis. [facility]  02/17/2015

## 2015-04-17 ENCOUNTER — Other Ambulatory Visit: Payer: Self-pay | Admitting: Internal Medicine

## 2015-04-18 ENCOUNTER — Ambulatory Visit: Payer: Self-pay | Admitting: Internal Medicine

## 2015-04-25 ENCOUNTER — Other Ambulatory Visit: Payer: Self-pay | Admitting: Internal Medicine

## 2015-04-25 ENCOUNTER — Ambulatory Visit: Payer: Self-pay | Admitting: Internal Medicine

## 2015-05-17 DIAGNOSIS — J449 Chronic obstructive pulmonary disease, unspecified: Secondary | ICD-10-CM | POA: Diagnosis not present

## 2015-05-17 DIAGNOSIS — R05 Cough: Secondary | ICD-10-CM | POA: Diagnosis not present

## 2015-05-17 DIAGNOSIS — J45909 Unspecified asthma, uncomplicated: Secondary | ICD-10-CM | POA: Diagnosis not present

## 2015-05-17 DIAGNOSIS — J310 Chronic rhinitis: Secondary | ICD-10-CM | POA: Diagnosis not present

## 2015-06-05 ENCOUNTER — Ambulatory Visit: Payer: Self-pay | Admitting: Internal Medicine

## 2015-06-05 DIAGNOSIS — J4530 Mild persistent asthma, uncomplicated: Secondary | ICD-10-CM | POA: Diagnosis not present

## 2015-06-22 DIAGNOSIS — J4520 Mild intermittent asthma, uncomplicated: Secondary | ICD-10-CM | POA: Diagnosis not present

## 2015-06-30 ENCOUNTER — Encounter: Payer: Self-pay | Admitting: Internal Medicine

## 2015-06-30 ENCOUNTER — Ambulatory Visit (INDEPENDENT_AMBULATORY_CARE_PROVIDER_SITE_OTHER): Payer: Medicare Other | Admitting: Internal Medicine

## 2015-06-30 VITALS — BP 110/62 | HR 72 | Ht 63.5 in | Wt 173.0 lb

## 2015-06-30 DIAGNOSIS — G40209 Localization-related (focal) (partial) symptomatic epilepsy and epileptic syndromes with complex partial seizures, not intractable, without status epilepticus: Secondary | ICD-10-CM | POA: Insufficient documentation

## 2015-06-30 DIAGNOSIS — Z23 Encounter for immunization: Secondary | ICD-10-CM

## 2015-06-30 DIAGNOSIS — I10 Essential (primary) hypertension: Secondary | ICD-10-CM

## 2015-06-30 DIAGNOSIS — J479 Bronchiectasis, uncomplicated: Secondary | ICD-10-CM

## 2015-06-30 DIAGNOSIS — R21 Rash and other nonspecific skin eruption: Secondary | ICD-10-CM | POA: Diagnosis not present

## 2015-06-30 DIAGNOSIS — I2699 Other pulmonary embolism without acute cor pulmonale: Secondary | ICD-10-CM | POA: Diagnosis not present

## 2015-06-30 MED ORDER — SPIRONOLACTONE-HCTZ 25-25 MG PO TABS: 1.0000 | ORAL_TABLET | Freq: Every day | ORAL | Status: DC

## 2015-06-30 MED ORDER — NYSTATIN 100000 UNIT/GM EX CREA: 1.0000 "application " | TOPICAL_CREAM | Freq: Two times a day (BID) | CUTANEOUS | Status: DC

## 2015-06-30 NOTE — Progress Notes (Signed)
Date:  06/30/2015   Name:  Rebecca Wolf   DOB:  June 17, 1925   MRN:  [id number]   Chief Complaint: Rash; Hypertension; and Seizures Rash This is a new problem. The current episode started in the past 7 days. The problem has been gradually improving since onset. The affected locations include the torso. The rash is characterized by itchiness and redness. Pertinent negatives include no cough, fatigue or fever. Past treatments include moisturizer. The treatment provided mild relief.  Hypertension This is a chronic problem. The current episode started more than 1 year ago. The problem is unchanged. The problem is controlled. Pertinent negatives include no chest pain or palpitations.  Seizures  This is a chronic problem. The problem has been resolved (none in at least 10 years). There were more than 10 seizures. Pertinent negatives include no chest pain and no cough.  Bronchiectasis - patient is followed by Dr. Raul Del pulmonary. She recently had a pulmonary infection and a chest x-ray which was borderline abnormal. She's completed her antibiotics and feels well today. She would like to get a flu vaccine.   Review of Systems  Constitutional: Negative for fever, chills and fatigue.  Respiratory: Negative for cough, chest tightness and wheezing.   Cardiovascular: Positive for leg swelling. Negative for chest pain and palpitations.  Gastrointestinal: Positive for rectal pain (large hemorrhoid). Negative for constipation and blood in stool.  Genitourinary: Negative for hematuria.  Skin: Positive for color change and rash.  Neurological: Negative for tremors, seizures and syncope.  Psychiatric/Behavioral: Negative for dysphoric mood.    Patient Active Problem List   Diagnosis Date Noted  . Imbalance 11/08/2014  . Bronchiectasis ([place]) 11/08/2014  . Essential (primary) hypertension 11/08/2014  . Personal history of malignant neoplasm of breast 11/08/2014  . History of malignant carcinoid tumor  of bronchus and lung 11/08/2014  . H/O peptic ulcer 11/08/2014  . Asthma, mild intermittent 11/08/2014  . PE (pulmonary embolism) 11/08/2014  . Gonalgia 11/08/2014  . Leg varices 11/08/2014  . Complex partial epilepsy ([place]) 04/12/2014    Prior to Admission medications   Medication Sig Start Date End Date Taking? Authorizing Provider  albuterol (ACCUNEB) 1.25 MG/3ML nebulizer solution Inhale 1 ampule into the lungs 4 (four) times daily as needed. 05/06/14  Yes Historical Provider, MD  albuterol (PROVENTIL HFA;VENTOLIN HFA) 108 (90 BASE) MCG/ACT inhaler Inhale 1 puff into the lungs 4 (four) times daily.   Yes Historical Provider, MD  amLODipine (NORVASC) 5 MG tablet TAKE 1 TABLET BY MOUTH EVERY DAY 04/17/15  Yes Glean Hess, MD  ELIQUIS 2.5 MG TABS tablet TAKE 1 TABLET BY MOUTH TWICE DAILY 01/27/15  Yes Glean Hess, MD  fluticasone [facility]) 50 MCG/ACT nasal spray Place 2 sprays into the nose daily.   Yes Historical Provider, MD  Fluticasone-Salmeterol (ADVAIR) 500-50 MCG/DOSE AEPB Inhale 1 puff into the lungs 2 (two) times daily.   Yes Historical Provider, MD  hydrocortisone cream 1 % Apply 1 application topically 2 (two) times daily. 12/13/14  Yes Glean Hess, MD  levETIRAcetam (KEPPRA) 750 MG tablet Take 1 tablet by mouth 2 (two) times daily. 06/29/13  Yes Historical Provider, MD  montelukast (SINGULAIR) 10 MG tablet Take 1 tablet by mouth daily.   Yes Historical Provider, MD  Olopatadine HCl 0.6 % SOLN Place 1 spray into the nose daily.   Yes Historical Provider, MD  pantoprazole (PROTONIX) 40 MG tablet Take 1 tablet by mouth daily. 07/17/14  Yes Historical Provider, MD  spironolactone-hydrochlorothiazide (  ALDACTAZIDE) 25-25 MG per tablet Take 1 tablet by mouth daily. 02/06/15  Yes Glean Hess, MD    Allergies  Allergen Reactions  . Levofloxacin   . Sulfa Antibiotics     Past Surgical History  Procedure Laterality Date  . Thoracotomy/lobectomy  1999    carcinoid  tumor  . Hemicolectomy Right     bowel obstruction  . Total vaginal hysterectomy    . Varicose vein surgery    . Hemorrhoid surgery    . Breast lumpectomy Left 2004/2005    cancer    Social History  Substance Use Topics  . Smoking status: Never Smoker   . Smokeless tobacco: None  . Alcohol Use: None    Medication list has been reviewed and updated.  Physical Exam  Constitutional: She is oriented to person, place, and time. She appears well-developed and well-nourished.  Neck: Normal range of motion. Neck supple. No thyromegaly present.  Cardiovascular: Normal rate, regular rhythm and normal heart sounds.   Pulmonary/Chest: Effort normal. She has decreased breath sounds. She has no wheezes. She has no rhonchi.  Musculoskeletal: She exhibits edema. She exhibits no tenderness.  Neurological: She is alert and oriented to person, place, and time.  Skin: Skin is warm and dry. There is erythema (under right breast).  Nursing note and vitals reviewed.   BP 110/62 mmHg  Pulse 72  Ht 5' 3.5" (1.613 m)  Wt 173 lb (78.472 kg)  BMI 30.16 kg/m2  Assessment and Plan: 1. Rash Consistent with fungal infection Keep area dry and dust with powder - nystatin cream (MYCOSTATIN); Apply 1 application topically 2 (two) times daily.  Dispense: 30 g; Refill: 0  2. Essential (primary) hypertension controlled - spironolactone-hydrochlorothiazide (ALDACTAZIDE) 25-25 MG tablet; Take 1 tablet by mouth daily.  Dispense: 30 tablet; Refill: 5  3. Partial symptomatic epilepsy with complex partial seizures, not intractable, without status epilepticus ([place]) No seizures in many years Followed by Neurology  4. Bronchiectasis without complication ([place]) Recent course of antibiotics - now back to baseline  5. Need for influenza vaccination - Flu Vaccine QUAD 36+ mos IM   Halina Maidens, MD [facility]  06/30/2015

## 2015-07-17 ENCOUNTER — Ambulatory Visit: Payer: Self-pay | Admitting: Internal Medicine

## 2015-07-20 ENCOUNTER — Ambulatory Visit (INDEPENDENT_AMBULATORY_CARE_PROVIDER_SITE_OTHER): Payer: Medicare Other | Admitting: Internal Medicine

## 2015-07-20 ENCOUNTER — Encounter: Payer: Self-pay | Admitting: Internal Medicine

## 2015-07-20 VITALS — BP 124/62 | HR 72 | Ht 63.5 in | Wt 171.6 lb

## 2015-07-20 DIAGNOSIS — B359 Dermatophytosis, unspecified: Secondary | ICD-10-CM

## 2015-07-20 DIAGNOSIS — I10 Essential (primary) hypertension: Secondary | ICD-10-CM | POA: Diagnosis not present

## 2015-07-20 MED ORDER — CEPHALEXIN 500 MG PO CAPS: 500.0000 mg | ORAL_CAPSULE | Freq: Four times a day (QID) | ORAL | Status: DC

## 2015-07-20 MED ORDER — CLOTRIMAZOLE 1 % EX CREA: 1.0000 "application " | TOPICAL_CREAM | Freq: Two times a day (BID) | CUTANEOUS | Status: DC

## 2015-07-20 NOTE — Progress Notes (Signed)
Date:  07/20/2015   Name:  Rebecca Wolf   DOB:  10/27/1924   MRN:  [id number]   Chief Complaint: Rash Rash This is a chronic problem. The problem is unchanged. The affected locations include the chest (under right breast). The rash is characterized by dryness and redness. Pertinent negatives include no cough, fatigue or fever. (No itching, burning or pain) Treatments tried: nystatin. The treatment provided no relief.  Hypertension This is a chronic problem. The current episode started more than 1 year ago. The problem is unchanged. The problem is controlled. Pertinent negatives include no palpitations. Past treatments include calcium channel blockers and diuretics.     Review of Systems  Constitutional: Negative for fever and fatigue.  Respiratory: Positive for wheezing. Negative for cough.   Cardiovascular: Negative for palpitations.  Gastrointestinal: Negative for blood in stool.  Genitourinary: Positive for dysuria (briefly earlier in the week). Negative for urgency, frequency and hematuria.  Skin: Positive for color change and rash.  Neurological: Negative for dizziness, seizures and syncope.    Patient Active Problem List   Diagnosis Date Noted  . Partial symptomatic epilepsy with complex partial seizures, not intractable, without status epilepticus ([place]) 06/30/2015  . Bronchiectasis without complication ([place]) [id number]  . Imbalance 11/08/2014  . Essential (primary) hypertension 11/08/2014  . Personal history of malignant neoplasm of breast 11/08/2014  . History of malignant carcinoid tumor of bronchus and lung 11/08/2014  . H/O peptic ulcer 11/08/2014  . Asthma, mild intermittent 11/08/2014  . PE (pulmonary embolism) 11/08/2014  . Gonalgia 11/08/2014  . Leg varices 11/08/2014    Prior to Admission medications   Medication Sig Start Date End Date Taking? Authorizing Provider  albuterol (ACCUNEB) 1.25 MG/3ML nebulizer solution Inhale 1 ampule into the lungs 4 (four)  times daily as needed. 05/06/14  Yes Historical Provider, MD  albuterol (PROVENTIL HFA;VENTOLIN HFA) 108 (90 BASE) MCG/ACT inhaler Inhale 1 puff into the lungs 4 (four) times daily.   Yes Historical Provider, MD  amLODipine (NORVASC) 5 MG tablet TAKE 1 TABLET BY MOUTH EVERY DAY 04/17/15  Yes Glean Hess, MD  ELIQUIS 2.5 MG TABS tablet TAKE 1 TABLET BY MOUTH TWICE DAILY 01/27/15  Yes Glean Hess, MD  fluticasone [facility]) 50 MCG/ACT nasal spray Place 2 sprays into the nose daily.   Yes Historical Provider, MD  Fluticasone-Salmeterol (ADVAIR) 500-50 MCG/DOSE AEPB Inhale 1 puff into the lungs 2 (two) times daily.   Yes Historical Provider, MD  hydrocortisone cream 1 % Apply 1 application topically 2 (two) times daily. 12/13/14  Yes Glean Hess, MD  levETIRAcetam (KEPPRA) 750 MG tablet Take 1 tablet by mouth 2 (two) times daily. 06/29/13  Yes Historical Provider, MD  montelukast (SINGULAIR) 10 MG tablet Take 1 tablet by mouth daily.   Yes Historical Provider, MD  nystatin cream (MYCOSTATIN) Apply 1 application topically 2 (two) times daily. 06/30/15  Yes Glean Hess, MD  Olopatadine HCl 0.6 % SOLN Place 1 spray into the nose daily.   Yes Historical Provider, MD  pantoprazole (PROTONIX) 40 MG tablet Take 1 tablet by mouth daily. 07/17/14  Yes Historical Provider, MD  spironolactone-hydrochlorothiazide (ALDACTAZIDE) 25-25 MG tablet Take 1 tablet by mouth daily. 06/30/15  Yes Glean Hess, MD    Allergies  Allergen Reactions  . Levofloxacin   . Sulfa Antibiotics     Past Surgical History  Procedure Laterality Date  . Thoracotomy/lobectomy  1999    carcinoid tumor  . Hemicolectomy Right  bowel obstruction  . Total vaginal hysterectomy    . Varicose vein surgery    . Hemorrhoid surgery    . Breast lumpectomy Left 2004/2005    cancer    Social History  Substance Use Topics  . Smoking status: Never Smoker   . Smokeless tobacco: None  . Alcohol Use: None      Medication list has been reviewed and updated.   Physical Exam  Constitutional: She is oriented to person, place, and time. She appears well-developed. No distress.  HENT:  Head: Normocephalic and atraumatic.  Cardiovascular: Normal rate, regular rhythm and normal heart sounds.   Pulmonary/Chest: Effort normal. She has wheezes.  Neurological: She is alert and oriented to person, place, and time.  Skin: Skin is warm and dry. There is erythema (under right breast).    BP 124/62 mmHg  Pulse 72  Ht 5' 3.5" (1.613 m)  Wt 171 lb 9.6 oz (77.837 kg)  BMI 29.92 kg/m2  Assessment and Plan: 1. Tinea Change topical to lotrimin Keflex 500 mg qid for 7 days - clotrimazole (LOTRIMIN) 1 % cream; Apply 1 application topically 2 (two) times daily.  Dispense: 30 g; Refill: 2  2. Essential (primary) hypertension controlled   Halina Maidens, MD [facility]  07/20/2015

## 2015-07-24 ENCOUNTER — Other Ambulatory Visit: Payer: Self-pay | Admitting: Internal Medicine

## 2015-07-25 DIAGNOSIS — G40209 Localization-related (focal) (partial) symptomatic epilepsy and epileptic syndromes with complex partial seizures, not intractable, without status epilepticus: Secondary | ICD-10-CM | POA: Diagnosis not present

## 2015-08-11 ENCOUNTER — Other Ambulatory Visit: Payer: Self-pay | Admitting: Internal Medicine

## 2015-08-18 ENCOUNTER — Ambulatory Visit (INDEPENDENT_AMBULATORY_CARE_PROVIDER_SITE_OTHER): Payer: Medicare Other | Admitting: Internal Medicine

## 2015-08-18 ENCOUNTER — Encounter: Payer: Self-pay | Admitting: Internal Medicine

## 2015-08-18 VITALS — BP 122/70 | HR 64 | Ht 63.5 in | Wt 171.6 lb

## 2015-08-18 DIAGNOSIS — I8393 Asymptomatic varicose veins of bilateral lower extremities: Secondary | ICD-10-CM

## 2015-08-18 DIAGNOSIS — I10 Essential (primary) hypertension: Secondary | ICD-10-CM | POA: Diagnosis not present

## 2015-08-18 DIAGNOSIS — J479 Bronchiectasis, uncomplicated: Secondary | ICD-10-CM

## 2015-08-18 DIAGNOSIS — M25561 Pain in right knee: Secondary | ICD-10-CM

## 2015-08-18 DIAGNOSIS — M25562 Pain in left knee: Secondary | ICD-10-CM | POA: Diagnosis not present

## 2015-08-18 DIAGNOSIS — I8390 Asymptomatic varicose veins of unspecified lower extremity: Secondary | ICD-10-CM

## 2015-08-18 NOTE — Progress Notes (Signed)
Date:  08/18/2015   Name:  Rebecca Wolf   DOB:  Apr 23, 1925   MRN:  [id number]   Chief Complaint: Leg Swelling  Patient presents complaining of some swelling in her left lower leg. She says it's been there for about a day. 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 She denies it being red or hot. She also complains of buckling of the left knee when walking but is a new problem over the past several days. She has not fallen she has known osteoarthritis. When she lived in [place] she got frequent cortisone injections but has not established with orthopedics here in [place]. She is compliant with Eliquis for anti-coagulation. She has chronic bronchiectasis but no recent flares. Increase in shortness of breath or difficulty sleeping.    Review of Systems  Constitutional: Negative for fever and chills.  Respiratory: Negative for cough, chest tightness and shortness of breath.   Cardiovascular: Positive for leg swelling. Negative for chest pain and palpitations.  Musculoskeletal: Positive for myalgias, arthralgias and gait problem.  Skin: Negative for color change and wound.  Neurological: Positive for weakness. Negative for dizziness, syncope and headaches.    Patient Active Problem List   Diagnosis Date Noted  . Partial symptomatic epilepsy with complex partial seizures, not intractable, without status epilepticus ([place]) 06/30/2015  . Bronchiectasis without complication ([place]) [id number]  . Imbalance 11/08/2014  . Essential (primary) hypertension 11/08/2014  . Personal history of malignant neoplasm of breast 11/08/2014  . History of malignant carcinoid tumor of bronchus and lung 11/08/2014  . H/O peptic ulcer 11/08/2014  . Asthma, mild intermittent 11/08/2014  . PE (pulmonary embolism) 11/08/2014  . Gonalgia 11/08/2014  . Leg varices 11/08/2014    Prior to Admission medications   Medication Sig Start Date End Date Taking? Authorizing Provider  albuterol (ACCUNEB) 1.25 MG/3ML  nebulizer solution Inhale 1 ampule into the lungs 4 (four) times daily as needed. 05/06/14  Yes Historical Provider, MD  albuterol (PROVENTIL HFA;VENTOLIN HFA) 108 (90 BASE) MCG/ACT inhaler Inhale 1 puff into the lungs 4 (four) times daily.   Yes Historical Provider, MD  amLODipine (NORVASC) 5 MG tablet TAKE 1 TABLET BY MOUTH EVERY DAY 07/24/15  Yes Glean Hess, MD  cephALEXin (KEFLEX) 500 MG capsule Take 1 capsule (500 mg total) by mouth 4 (four) times daily. 07/20/15  Yes Glean Hess, MD  clotrimazole (LOTRIMIN) 1 % cream Apply 1 application topically 2 (two) times daily. 07/20/15  Yes Glean Hess, MD  ELIQUIS 2.5 MG TABS tablet TAKE 1 TABLET BY MOUTH TWICE DAILY 01/27/15  Yes Glean Hess, MD  fluticasone [facility]) 50 MCG/ACT nasal spray Place 2 sprays into the nose daily.   Yes Historical Provider, MD  Fluticasone-Salmeterol (ADVAIR) 500-50 MCG/DOSE AEPB Inhale 1 puff into the lungs 2 (two) times daily.   Yes Historical Provider, MD  hydrocortisone cream 1 % Apply 1 application topically 2 (two) times daily. 12/13/14  Yes Glean Hess, MD  levETIRAcetam (KEPPRA) 750 MG tablet Take 1 tablet by mouth 2 (two) times daily. 06/29/13  Yes Historical Provider, MD  montelukast (SINGULAIR) 10 MG tablet Take 1 tablet by mouth daily.   Yes Historical Provider, MD  nystatin cream (MYCOSTATIN) Apply 1 application topically 2 (two) times daily. 06/30/15  Yes Glean Hess, MD  Olopatadine HCl 0.6 % SOLN Place 1 spray into the nose daily.   Yes Historical Provider, MD  pantoprazole (PROTONIX) 40 MG tablet TAKE 1 TABLET  BY MOUTH TWICE DAILY 08/11/15  Yes Glean Hess, MD  spironolactone-hydrochlorothiazide (ALDACTAZIDE) 25-25 MG tablet Take 1 tablet by mouth daily. 06/30/15  Yes Glean Hess, MD    Allergies  Allergen Reactions  . Levofloxacin   . Sulfa Antibiotics     Past Surgical History  Procedure Laterality Date  . Thoracotomy/lobectomy  1999    carcinoid tumor  .  Hemicolectomy Right     bowel obstruction  . Total vaginal hysterectomy    . Varicose vein surgery    . Hemorrhoid surgery    . Breast lumpectomy Left 2004/2005    cancer    Social History  Substance Use Topics  . Smoking status: Never Smoker   . Smokeless tobacco: None  . Alcohol Use: No     Medication list has been reviewed and updated.   Physical Exam  Constitutional: She is oriented to person, place, and time. She appears well-developed and well-nourished.  Neck: Normal range of motion. Neck supple. No thyromegaly present.  Cardiovascular: Normal rate and regular rhythm.   Pulmonary/Chest: Effort normal and breath sounds normal.  Musculoskeletal: She exhibits edema (both ankles equally) and tenderness.       Right knee: She exhibits decreased range of motion. She exhibits no swelling. Tenderness found. Medial joint line and lateral joint line tenderness noted.       Left knee: She exhibits decreased range of motion and swelling. She exhibits no effusion, no ecchymosis, no deformity and no erythema. Tenderness found. Medial joint line and lateral joint line tenderness noted.  Neurological: She is alert and oriented to person, place, and time.  Nursing note and vitals reviewed.   BP 122/70 mmHg  Pulse 64  Ht 5' 3.5" (1.613 m)  Wt 171 lb 9.6 oz (77.837 kg)  BMI 29.92 kg/m2  Assessment and Plan: 1. Arthralgia of both knees Continue walker and stand by assistance Avoid brace or wrap - this would worsen edema - Ambulatory referral to Orthopedic Surgery  2. Essential (primary) hypertension controlled  3. Bronchiectasis without complication ([place]) stable  4. Leg varices No change is edema by exam; no findings to suggest DVT Continue Eliquis  Rebecca Maidens, MD [facility]  08/18/2015

## 2015-08-23 ENCOUNTER — Other Ambulatory Visit: Payer: Medicare Other

## 2015-08-23 ENCOUNTER — Ambulatory Visit: Payer: Medicare Other | Admitting: Hematology and Oncology

## 2015-08-28 DIAGNOSIS — M25562 Pain in left knee: Secondary | ICD-10-CM | POA: Diagnosis not present

## 2015-08-28 DIAGNOSIS — M25561 Pain in right knee: Secondary | ICD-10-CM | POA: Diagnosis not present

## 2015-08-28 DIAGNOSIS — M87051 Idiopathic aseptic necrosis of right femur: Secondary | ICD-10-CM | POA: Diagnosis not present

## 2015-08-28 DIAGNOSIS — M170 Bilateral primary osteoarthritis of knee: Secondary | ICD-10-CM | POA: Diagnosis not present

## 2015-08-29 ENCOUNTER — Other Ambulatory Visit: Payer: Self-pay

## 2015-08-29 ENCOUNTER — Other Ambulatory Visit: Payer: Self-pay | Admitting: Orthopedic Surgery

## 2015-08-29 DIAGNOSIS — M87051 Idiopathic aseptic necrosis of right femur: Secondary | ICD-10-CM

## 2015-09-08 ENCOUNTER — Ambulatory Visit: Payer: Self-pay | Admitting: Internal Medicine

## 2015-09-11 ENCOUNTER — Inpatient Hospital Stay: Payer: Medicare Other

## 2015-09-11 ENCOUNTER — Inpatient Hospital Stay: Payer: Medicare Other | Admitting: Hematology and Oncology

## 2015-09-11 ENCOUNTER — Other Ambulatory Visit: Payer: Medicare Other

## 2015-09-19 ENCOUNTER — Ambulatory Visit: Payer: Medicare Other

## 2015-09-22 ENCOUNTER — Other Ambulatory Visit: Payer: Self-pay

## 2015-09-22 DIAGNOSIS — C50919 Malignant neoplasm of unspecified site of unspecified female breast: Secondary | ICD-10-CM

## 2015-09-25 ENCOUNTER — Encounter: Payer: Self-pay | Admitting: Hematology and Oncology

## 2015-09-25 ENCOUNTER — Inpatient Hospital Stay: Payer: Medicare Other | Attending: Hematology and Oncology

## 2015-09-25 ENCOUNTER — Inpatient Hospital Stay (HOSPITAL_BASED_OUTPATIENT_CLINIC_OR_DEPARTMENT_OTHER): Payer: Medicare Other | Admitting: Hematology and Oncology

## 2015-09-25 ENCOUNTER — Telehealth: Payer: Self-pay | Admitting: Hematology and Oncology

## 2015-09-25 ENCOUNTER — Other Ambulatory Visit: Payer: Self-pay | Admitting: Internal Medicine

## 2015-09-25 VITALS — BP 148/85 | HR 83 | Temp 96.7°F | Resp 20 | Wt 172.0 lb

## 2015-09-25 DIAGNOSIS — Z85110 Personal history of malignant carcinoid tumor of bronchus and lung: Secondary | ICD-10-CM

## 2015-09-25 DIAGNOSIS — Z86711 Personal history of pulmonary embolism: Secondary | ICD-10-CM

## 2015-09-25 DIAGNOSIS — Z79899 Other long term (current) drug therapy: Secondary | ICD-10-CM | POA: Insufficient documentation

## 2015-09-25 DIAGNOSIS — I10 Essential (primary) hypertension: Secondary | ICD-10-CM | POA: Insufficient documentation

## 2015-09-25 DIAGNOSIS — Z86718 Personal history of other venous thrombosis and embolism: Secondary | ICD-10-CM

## 2015-09-25 DIAGNOSIS — Z8711 Personal history of peptic ulcer disease: Secondary | ICD-10-CM | POA: Diagnosis not present

## 2015-09-25 DIAGNOSIS — Z923 Personal history of irradiation: Secondary | ICD-10-CM

## 2015-09-25 DIAGNOSIS — Z7901 Long term (current) use of anticoagulants: Secondary | ICD-10-CM | POA: Diagnosis not present

## 2015-09-25 DIAGNOSIS — K219 Gastro-esophageal reflux disease without esophagitis: Secondary | ICD-10-CM | POA: Insufficient documentation

## 2015-09-25 DIAGNOSIS — C50919 Malignant neoplasm of unspecified site of unspecified female breast: Secondary | ICD-10-CM

## 2015-09-25 DIAGNOSIS — Z9223 Personal history of estrogen therapy: Secondary | ICD-10-CM | POA: Diagnosis not present

## 2015-09-25 DIAGNOSIS — D0592 Unspecified type of carcinoma in situ of left breast: Secondary | ICD-10-CM

## 2015-09-25 DIAGNOSIS — Z170 Estrogen receptor positive status [ER+]: Secondary | ICD-10-CM | POA: Insufficient documentation

## 2015-09-25 DIAGNOSIS — B369 Superficial mycosis, unspecified: Secondary | ICD-10-CM

## 2015-09-25 DIAGNOSIS — Z853 Personal history of malignant neoplasm of breast: Secondary | ICD-10-CM | POA: Diagnosis not present

## 2015-09-25 DIAGNOSIS — B372 Candidiasis of skin and nail: Secondary | ICD-10-CM

## 2015-09-25 DIAGNOSIS — I2699 Other pulmonary embolism without acute cor pulmonale: Secondary | ICD-10-CM

## 2015-09-25 DIAGNOSIS — Z780 Asymptomatic menopausal state: Secondary | ICD-10-CM

## 2015-09-25 LAB — CBC WITH DIFFERENTIAL/PLATELET
Basophils Absolute: 0.1 10*3/uL (ref 0–0.1)
Basophils Relative: 1 %
Eosinophils Absolute: 0.1 10*3/uL (ref 0–0.7)
Eosinophils Relative: 2 %
HCT: 37.8 % (ref 35.0–47.0)
Hemoglobin: 12.5 g/dL (ref 12.0–16.0)
Lymphocytes Relative: 29 %
Lymphs Abs: 2 10*3/uL (ref 1.0–3.6)
MCH: 25.7 pg — ABNORMAL LOW (ref 26.0–34.0)
MCHC: 33.2 g/dL (ref 32.0–36.0)
MCV: 77.5 fL — ABNORMAL LOW (ref 80.0–100.0)
Monocytes Absolute: 0.4 10*3/uL (ref 0.2–0.9)
Monocytes Relative: 6 %
Neutro Abs: 4.5 10*3/uL (ref 1.4–6.5)
Neutrophils Relative %: 62 %
Platelets: 221 10*3/uL (ref 150–440)
RBC: 4.87 MIL/uL (ref 3.80–5.20)
RDW: 14.7 % — ABNORMAL HIGH (ref 11.5–14.5)
WBC: 7.1 10*3/uL (ref 3.6–11.0)

## 2015-09-25 LAB — COMPREHENSIVE METABOLIC PANEL
ALT: 20 U/L (ref 14–54)
AST: 25 U/L (ref 15–41)
Albumin: 3.9 g/dL (ref 3.5–5.0)
Alkaline Phosphatase: 96 U/L (ref 38–126)
Anion gap: 7 (ref 5–15)
BUN: 19 mg/dL (ref 6–20)
CO2: 25 mmol/L (ref 22–32)
Calcium: 8.9 mg/dL (ref 8.9–10.3)
Chloride: 103 mmol/L (ref 101–111)
Creatinine, Ser: 1.14 mg/dL — ABNORMAL HIGH (ref 0.44–1.00)
GFR calc Af Amer: 48 mL/min — ABNORMAL LOW (ref >60)
GFR calc non Af Amer: 41 mL/min — ABNORMAL LOW (ref >60)
Glucose, Bld: 190 mg/dL — ABNORMAL HIGH (ref 65–99)
Potassium: 3.5 mmol/L (ref 3.5–5.1)
Sodium: 135 mmol/L (ref 135–145)
Total Bilirubin: 0.5 mg/dL (ref 0.3–1.2)
Total Protein: 8 g/dL (ref 6.5–8.1)

## 2015-09-25 MED ORDER — NYSTATIN 100000 UNIT/GM EX POWD: Freq: Three times a day (TID) | CUTANEOUS | Status: DC

## 2015-09-25 NOTE — Telephone Encounter (Signed)
The schedulers here in [place] keep a reminder folder because this is not the first time that we have been told about mammograms can't be made 1 year in advance.  The scheduler wait for a few months and then schedule it and send the sch, to the pt.  Can you do this?

## 2015-09-25 NOTE — Telephone Encounter (Signed)
  Thanks  M 

## 2015-09-25 NOTE — Progress Notes (Signed)
Patient here today for routine follow up regarding breast cancer. Patient reports rash under her breast, has tried creams but they are not working.

## 2015-09-25 NOTE — Telephone Encounter (Signed)
She scheduled a mammo for Rebecca Wolf for next week but said they can not schedule her next one that far out. A scheduling order will need to be made again in a few months. Thanks.

## 2015-09-25 NOTE — Progress Notes (Signed)
[facility] day:  09/25/2015  Chief Complaint: Rebecca Wolf is a 82 y.o. female with a history of breast cancer, carcinoid tumor, and DVT/PE who is seen for 1 year assessment.  HPI:  The patient was last seen in the medical oncology clinic on 08/17/2014.  At that time, she was seen for initial assessment by me.  Symptomatically, she had issues secondary to stenosis in her back.  She had a neuropathy in her hands and feet.  Weight was stable.  CA27.29 was 21.6 (normal).  During the interim, she has done well.  She voices no concerns.  She has had a yeast infection under her breast which hasn't cleared with Nystatin cream.  She has not had her mammogram.  Past Medical History  Diagnosis Date  . 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  . Polyp of colon 2002    bleeding polyp of right ascending colon  . Hypertension   . PUD (peptic ulcer disease)   . Asthma   . Seizures ([place])     epilepsy well controlled  . GERD (gastroesophageal reflux disease)   . Hemorrhoids   . Breast cancer ([place])     2007, ER -pos, PR- pos., Her 2 -neg.    Past Surgical History  Procedure Laterality Date  . Thoracotomy/lobectomy  1999    carcinoid tumor  . Hemicolectomy Right     bowel obstruction  . Total vaginal hysterectomy    . Varicose vein surgery    . Hemorrhoid surgery    . Breast lumpectomy Left 2004/2005    cancer    Family History  Problem Relation Age of Onset  . Diabetes Sister   . Alcoholism Father     Social History:  reports that she has never smoked. She does not have any smokeless tobacco history on file. She reports that she does not drink alcohol or use illicit drugs.  She moved from [place] to [place].  The patient is alone today.  Allergies:  Allergies  Allergen Reactions  . Levofloxacin   . Sulfa Antibiotics     Current Medications: Current Outpatient Prescriptions  Medication Sig Dispense Refill  . albuterol  (ACCUNEB) 1.25 MG/3ML nebulizer solution Inhale 1 ampule into the lungs 4 (four) times daily as needed.    Marland Kitchen albuterol (PROVENTIL HFA;VENTOLIN HFA) 108 (90 BASE) MCG/ACT inhaler Inhale 1 puff into the lungs 4 (four) times daily.    Marland Kitchen amLODipine (NORVASC) 5 MG tablet TAKE 1 TABLET BY MOUTH EVERY DAY 90 tablet 3  . clotrimazole (LOTRIMIN) 1 % cream Apply 1 application topically 2 (two) times daily. 30 g 2  . ELIQUIS 2.5 MG TABS tablet TAKE 1 TABLET BY MOUTH TWICE DAILY 60 tablet 12  . fluticasone (FLONASE) 50 MCG/ACT nasal spray Place 2 sprays into the nose daily.    . Fluticasone-Salmeterol (ADVAIR) 500-50 MCG/DOSE AEPB Inhale 1 puff into the lungs 2 (two) times daily.    . hydrocortisone cream 1 % Apply 1 application topically 2 (two) times daily. 30 g 0  . levETIRAcetam (KEPPRA) 750 MG tablet Take 1 tablet by mouth 2 (two) times daily.    . montelukast (SINGULAIR) 10 MG tablet Take 1 tablet by mouth daily.    Marland Kitchen nystatin cream (MYCOSTATIN) Apply 1 application topically 2 (two) times daily. 30 g 0  . Olopatadine HCl 0.6 % SOLN Place 1 spray into the nose daily.    . pantoprazole (PROTONIX) 40 MG tablet TAKE  1 TABLET BY MOUTH TWICE DAILY 60 tablet 5  . spironolactone-hydrochlorothiazide (ALDACTAZIDE) 25-25 MG tablet Take 1 tablet by mouth daily. 30 tablet 5  . nystatin (MYCOSTATIN) powder Apply topically 3 (three) times daily. 15 g 0   No current facility-administered medications for this visit.    Review of Systems:  GENERAL:  Feels good.  No fevers, sweats or weight loss. PERFORMANCE STATUS (ECOG):  2 HEENT:  No visual changes, runny nose, sore throat, mouth sores or tenderness. Lungs: No shortness of breath or cough.  No hemoptysis. Cardiac:  No chest pain, palpitations, orthopnea, or PND. GI:  No nausea, vomiting, diarrhea, constipation, melena or hematochezia. GU:  No urgency, frequency, dysuria, or hematuria. Musculoskeletal:  No back pain.  No joint pain.  No muscle  tenderness. Extremities:  No pain or swelling. Skin:  Rash beneath breast.  No skin changes. Neuro:  Neuropathy in hands and feet.  No headache, focal weakness, balance or coordination issues. Endocrine:  No diabetes, thyroid issues, hot flashes or night sweats. Psych:  No mood changes, depression or anxiety. Pain:  No focal pain. Review of systems:  All other systems reviewed and found to be negative.  Physical Exam: Blood pressure 148/85, pulse 83, temperature 96.7 F (35.9 C), temperature source Tympanic, resp. rate 20, weight 172 lb (78.019 kg). GENERAL:  Well developed, well nourished, sitting comfortably in the exam room in no acute distress. MENTAL STATUS:  Alert and oriented to person, place and time. HEAD:  Pearline Cables hair.  Normocephalic, atraumatic, face symmetric, no Cushingoid features. EYES:  Blue eyes.  Pupils equal round and reactive to light and accomodation.  No conjunctivitis or scleral icterus. ENT:  Oropharynx clear without lesion.  Tongue normal. Mucous membranes moist.  RESPIRATORY:  Clear to auscultation without rales, wheezes or rhonchi. CARDIOVASCULAR:  Regular rate and rhythm without murmur, rub or gallop. BREAST:  Right breast without masses, skin changes or nipple discharge.  Left breast upper inner quadrant with post operative and post radiation changes.  No skin changes or nipple discharge.  ABDOMEN:  Soft, non-tender, with active bowel sounds, and no hepatosplenomegaly.  No masses. SKIN:  Candidal yeast infection beneath breasts. EXTREMITIES: No edema, no skin discoloration or tenderness.  No palpable cords. LYMPH NODES: No palpable cervical, supraclavicular, axillary or inguinal adenopathy  NEUROLOGICAL: Unremarkable. PSYCH:  Appropriate.  Appointment on 09/25/2015  Component Date Value Ref Range Status  . WBC 09/25/2015 7.1  3.6 - 11.0 K/uL Final  . RBC 09/25/2015 4.87  3.80 - 5.20 MIL/uL Final  . Hemoglobin 09/25/2015 12.5  12.0 - 16.0 g/dL Final  . HCT  09/25/2015 37.8  35.0 - 47.0 % Final  . MCV 09/25/2015 77.5* 80.0 - 100.0 fL Final  . MCH 09/25/2015 25.7* 26.0 - 34.0 pg Final  . MCHC 09/25/2015 33.2  32.0 - 36.0 g/dL Final  . RDW 09/25/2015 14.7* 11.5 - 14.5 % Final  . Platelets 09/25/2015 221  150 - 440 K/uL Final  . Neutrophils Relative % 09/25/2015 62   Final  . Neutro Abs 09/25/2015 4.5  1.4 - 6.5 K/uL Final  . Lymphocytes Relative 09/25/2015 29   Final  . Lymphs Abs 09/25/2015 2.0  1.0 - 3.6 K/uL Final  . Monocytes Relative 09/25/2015 6   Final  . Monocytes Absolute 09/25/2015 0.4  0.2 - 0.9 K/uL Final  . Eosinophils Relative 09/25/2015 2   Final  . Eosinophils Absolute 09/25/2015 0.1  0 - 0.7 K/uL Final  . Basophils Relative 09/25/2015 1  Final  . Basophils Absolute 09/25/2015 0.1  0 - 0.1 K/uL Final  . Sodium 09/25/2015 135  135 - 145 mmol/L Final  . Potassium 09/25/2015 3.5  3.5 - 5.1 mmol/L Final  . Chloride 09/25/2015 103  101 - 111 mmol/L Final  . CO2 09/25/2015 25  22 - 32 mmol/L Final  . Glucose, Bld 09/25/2015 190* 65 - 99 mg/dL Final  . BUN 09/25/2015 19  6 - 20 mg/dL Final  . Creatinine, Ser 09/25/2015 1.14* 0.44 - 1.00 mg/dL Final  . Calcium 09/25/2015 8.9  8.9 - 10.3 mg/dL Final  . Total Protein 09/25/2015 8.0  6.5 - 8.1 g/dL Final  . Albumin 09/25/2015 3.9  3.5 - 5.0 g/dL Final  . AST 09/25/2015 25  15 - 41 U/L Final  . ALT 09/25/2015 20  14 - 54 U/L Final  . Alkaline Phosphatase 09/25/2015 96  38 - 126 U/L Final  . Total Bilirubin 09/25/2015 0.5  0.3 - 1.2 mg/dL Final  . GFR calc non Af Amer 09/25/2015 41* >60 mL/min Final  . GFR calc Af Amer 09/25/2015 48* >60 mL/min Final   Comment: (NOTE) The eGFR has been calculated using the CKD EPI equation. This calculation has not been validated in all clinical situations. eGFR's persistently <60 mL/min signify possible Chronic Kidney Disease.   . Anion gap 09/25/2015 7  5 - 15 Final  . CA 27.29 09/25/2015 25.8  0.0 - 38.6 U/mL Final   Comment: (NOTE) [facility]/ACS methodology Performed At: [facility] [address] [place], [place] [id number] Lindon Romp MD [id number]     Assessment:  Rebecca Wolf is a 82 y.o. female with a history of breast cancer, carcinoid tumor, and DVT/PE.  She was diagnosed with precancerous breast lesion (? DCIS) in 2003.  She underwent lumpectomy.  She developed invasive lobular carcinoma disease in 2007. She underwent segmental mastectomy and sentinel lymph node biopsy followed by radiation.  No lymph nodes were involved.  Tumor was ER/PR positive.  She completed 5 years of Arimidex in 08/2011. Mammogram on 08/09/2013 was negative.  CA27.29 was 25.8 (normal) on 09/25/2015.  She was diagnosed with pulmonary carcinoid in 2001 after presenting with an abnormality on chest CT.  She was asymptomatic.  She underwent resection.  She has had no evidence of recurrent disease.  She has a history of DVT x 2 and pulmonary embolism x 2.  She has been on anticoagulation for 8-10 years.  She was initially on Coumadin, but switched to Eliquis in 2016.  She is unaware of any hypercoagulable work-up.   She has a history of GI bleeding in 2002.  She is s/p partial right ascending hemicolectomy for a bleeding polyp.  She has a history of reflux and peptic ulcer disease.  Hematocrit is normal (37.8).  MCV is microcytic.  While in [place], a hemoglobin electrophoresis was performed (no result available).  She was started on oral on 08/31/2012.  She is currently not taking iron.  Symptomatically, she voices no concerns.  Exam reveals a mild fungal rash beneath her breasts.    Plan: 1. Labs today: CBC with diff, CMP, CA27.29.  2. Review records from [facility] [place], [place]). 3. Reschedule mammogram. 4. Order next year's mammogram after results back from this year's mammogram. 5. Rx:  Nystatin powder topically TID to fungal rash beneath breasts. 6. Continue Eliquis. 7. RTC in 1 year for MD  assessment, labs (CBC with diff, CMP, CA27.29), and review  of mammogram.   Lequita Asal, MD  09/25/2015, 11:45 AM

## 2015-09-26 LAB — CANCER ANTIGEN 27.29: CA 27.29: 25.8 U/mL (ref 0.0–38.6)

## 2015-09-29 DIAGNOSIS — J4520 Mild intermittent asthma, uncomplicated: Secondary | ICD-10-CM | POA: Diagnosis not present

## 2015-09-29 DIAGNOSIS — R938 Abnormal findings on diagnostic imaging of other specified body structures: Secondary | ICD-10-CM | POA: Diagnosis not present

## 2015-09-29 DIAGNOSIS — R05 Cough: Secondary | ICD-10-CM | POA: Diagnosis not present

## 2015-09-29 DIAGNOSIS — J310 Chronic rhinitis: Secondary | ICD-10-CM | POA: Diagnosis not present

## 2015-10-01 ENCOUNTER — Encounter: Payer: Self-pay | Admitting: Hematology and Oncology

## 2015-10-03 ENCOUNTER — Ambulatory Visit
Admission: RE | Admit: 2015-10-03 | Discharge: 2015-10-03 | Disposition: A | Discharge status: Home / Self Care | Payer: Medicare Other | Source: Ambulatory Visit | Attending: Hematology and Oncology | Admitting: Hematology and Oncology

## 2015-10-03 ENCOUNTER — Other Ambulatory Visit: Payer: Self-pay | Admitting: Hematology and Oncology

## 2015-10-03 DIAGNOSIS — D0592 Unspecified type of carcinoma in situ of left breast: Secondary | ICD-10-CM

## 2015-10-03 DIAGNOSIS — Z1231 Encounter for screening mammogram for malignant neoplasm of breast: Secondary | ICD-10-CM

## 2015-10-03 DIAGNOSIS — Z780 Asymptomatic menopausal state: Secondary | ICD-10-CM

## 2015-10-11 ENCOUNTER — Ambulatory Visit: Payer: Medicare Other

## 2015-10-17 DIAGNOSIS — L304 Erythema intertrigo: Secondary | ICD-10-CM | POA: Diagnosis not present

## 2015-10-17 DIAGNOSIS — L821 Other seborrheic keratosis: Secondary | ICD-10-CM | POA: Diagnosis not present

## 2015-10-19 DIAGNOSIS — H43313 Vitreous membranes and strands, bilateral: Secondary | ICD-10-CM | POA: Diagnosis not present

## 2015-10-19 DIAGNOSIS — H25013 Cortical age-related cataract, bilateral: Secondary | ICD-10-CM | POA: Diagnosis not present

## 2015-10-19 DIAGNOSIS — H2513 Age-related nuclear cataract, bilateral: Secondary | ICD-10-CM | POA: Diagnosis not present

## 2015-10-23 ENCOUNTER — Ambulatory Visit
Admission: RE | Admit: 2015-10-23 | Discharge: 2015-10-23 | Disposition: A | Discharge status: Home / Self Care | Payer: Medicare Other | Source: Ambulatory Visit | Attending: Orthopedic Surgery | Admitting: Orthopedic Surgery

## 2015-10-23 DIAGNOSIS — M25561 Pain in right knee: Secondary | ICD-10-CM | POA: Diagnosis not present

## 2015-10-23 DIAGNOSIS — M87051 Idiopathic aseptic necrosis of right femur: Secondary | ICD-10-CM

## 2015-10-23 DIAGNOSIS — S83271A Complex tear of lateral meniscus, current injury, right knee, initial encounter: Secondary | ICD-10-CM | POA: Diagnosis not present

## 2015-10-23 DIAGNOSIS — M23351 Other meniscus derangements, posterior horn of lateral meniscus, right knee: Secondary | ICD-10-CM | POA: Diagnosis not present

## 2015-10-23 DIAGNOSIS — M948X6 Other specified disorders of cartilage, lower leg: Secondary | ICD-10-CM | POA: Diagnosis not present

## 2015-10-25 ENCOUNTER — Ambulatory Visit (INDEPENDENT_AMBULATORY_CARE_PROVIDER_SITE_OTHER): Payer: Medicare Other | Admitting: Internal Medicine

## 2015-10-25 ENCOUNTER — Encounter: Payer: Self-pay | Admitting: Internal Medicine

## 2015-10-25 VITALS — BP 128/82 | HR 80 | Ht 63.5 in | Wt 176.0 lb

## 2015-10-25 DIAGNOSIS — I10 Essential (primary) hypertension: Secondary | ICD-10-CM | POA: Diagnosis not present

## 2015-10-25 DIAGNOSIS — Z87440 Personal history of urinary (tract) infections: Secondary | ICD-10-CM | POA: Diagnosis not present

## 2015-10-25 DIAGNOSIS — B351 Tinea unguium: Secondary | ICD-10-CM

## 2015-10-25 DIAGNOSIS — M25561 Pain in right knee: Secondary | ICD-10-CM | POA: Diagnosis not present

## 2015-10-25 LAB — POC URINALYSIS WITH MICROSCOPIC (NON AUTO)MANUAL RESULT
BILIRUBIN UA: NEGATIVE
Blood, UA: NEGATIVE
Crystals: 0
EPITHELIAL CELLS, URINE PER MICROSCOPY: 15
Glucose, UA: NEGATIVE
KETONES UA: NEGATIVE
Mucus, UA: 0
NITRITE UA: NEGATIVE
PH UA: 6
Protein, UA: NEGATIVE
RBC: 1 M/uL — AB (ref 4.04–5.48)
Spec Grav, UA: 1.015
UROBILINOGEN UA: 0.2
WBC CASTS UA: 2

## 2015-10-25 NOTE — Progress Notes (Signed)
Date:  10/25/2015   Name:  Rebecca Wolf   DOB:  08-03-1924   MRN:  [id number]   Chief Complaint: Urinary Tract Infection and Foot Pain Urinary Tract Infection  This is a recurrent problem. The current episode started in the past 7 days. The problem occurs intermittently. The patient is experiencing no pain. Associated symptoms include frequency. Pertinent negatives include no chills, discharge, flank pain, hematuria or urgency. She has tried increased fluids for the symptoms. The treatment provided significant (she thinks her frequency was from drinking fluids in the evening) relief.  Foot Pain This is a new problem. The current episode started in the past 7 days. Progression since onset: no pain now - part of the toenail came off the great toe. Pertinent negatives include no abdominal pain, arthralgias (no toe pain), chills or fever.  OA knee - seen by Ortho.  Had MRI of right knee.  Apparently bone on bone with possible ligament injury.  She improved after a cortisone injection.  The PA mentioned knee replacement surgery.  She is not inclined to surgery and I agree given her age.    Review of Systems  Constitutional: Negative for fever and chills.  Gastrointestinal: Negative for abdominal pain and constipation.  Genitourinary: Positive for frequency. Negative for dysuria, urgency, hematuria, flank pain and pelvic pain.  Musculoskeletal: Negative for arthralgias (no toe pain).    Patient Active Problem List   Diagnosis Date Noted  . Partial symptomatic epilepsy with complex partial seizures, not intractable, without status epilepticus ([place]) 06/30/2015  . Bronchiectasis without complication ([place]) Q000111Q  . Imbalance 11/08/2014  . Essential (primary) hypertension 11/08/2014  . Personal history of malignant neoplasm of breast 11/08/2014  . History of malignant carcinoid tumor of bronchus and lung 11/08/2014  . H/O peptic ulcer 11/08/2014  . Asthma, mild intermittent 11/08/2014  .  PE (pulmonary embolism) 11/08/2014  . Gonalgia 11/08/2014  . Leg varices 11/08/2014    Prior to Admission medications   Medication Sig Start Date End Date Taking? Authorizing Provider  albuterol (ACCUNEB) 1.25 MG/3ML nebulizer solution Inhale 1 ampule into the lungs 4 (four) times daily as needed. 05/06/14  Yes Historical Provider, MD  albuterol (PROVENTIL HFA;VENTOLIN HFA) 108 (90 BASE) MCG/ACT inhaler Inhale 1 puff into the lungs 4 (four) times daily.   Yes Historical Provider, MD  amLODipine (NORVASC) 5 MG tablet TAKE 1 TABLET BY MOUTH EVERY DAY 07/24/15  Yes Glean Hess, MD  clotrimazole (LOTRIMIN) 1 % cream Apply 1 application topically 2 (two) times daily. 07/20/15  Yes Glean Hess, MD  ELIQUIS 2.5 MG TABS tablet TAKE 1 TABLET BY MOUTH TWICE DAILY 09/25/15  Yes Glean Hess, MD  fluticasone [facility]) 50 MCG/ACT nasal spray Place 2 sprays into the nose daily.   Yes Historical Provider, MD  Fluticasone-Salmeterol (ADVAIR) 500-50 MCG/DOSE AEPB Inhale 1 puff into the lungs 2 (two) times daily.   Yes Historical Provider, MD  hydrocortisone cream 1 % Apply 1 application topically 2 (two) times daily. 12/13/14  Yes Glean Hess, MD  levETIRAcetam (KEPPRA) 750 MG tablet Take 1 tablet by mouth 2 (two) times daily. 06/29/13  Yes Historical Provider, MD  montelukast (SINGULAIR) 10 MG tablet Take 1 tablet by mouth daily.   Yes Historical Provider, MD  nystatin (MYCOSTATIN) powder Apply topically 3 (three) times daily. 09/25/15  Yes Lequita Asal, MD  nystatin cream (MYCOSTATIN) Apply 1 application topically 2 (two) times daily. 06/30/15  Yes Glean Hess, MD  Olopatadine HCl 0.6 % SOLN Place 1 spray into the nose daily.   Yes Historical Provider, MD  pantoprazole (PROTONIX) 40 MG tablet TAKE 1 TABLET BY MOUTH TWICE DAILY 08/11/15  Yes Glean Hess, MD  spironolactone-hydrochlorothiazide (ALDACTAZIDE) 25-25 MG tablet Take 1 tablet by mouth daily. 06/30/15  Yes Glean Hess,  MD    Allergies  Allergen Reactions  . Levofloxacin   . Sulfa Antibiotics     Past Surgical History  Procedure Laterality Date  . Thoracotomy/lobectomy  1999    carcinoid tumor  . Hemicolectomy Right     bowel obstruction  . Total vaginal hysterectomy    . Varicose vein surgery    . Hemorrhoid surgery    . Breast lumpectomy Left 2004/2005    cancer  . Breast excisional biopsy Left 2003    positive  . Breast excisional biopsy Left 2004    positive    Social History  Substance Use Topics  . Smoking status: Never Smoker   . Smokeless tobacco: None  . Alcohol Use: No     Medication list has been reviewed and updated.   Physical Exam  Constitutional: She is oriented to person, place, and time. She appears well-developed and well-nourished. No distress.  Cardiovascular: Normal rate, regular rhythm and normal heart sounds.   Pulmonary/Chest: Effort normal and breath sounds normal.  Abdominal: Soft. Bowel sounds are normal. There is tenderness (minmal suprapubic discomfort on the left).  Neurological: She is alert and oriented to person, place, and time.  Skin: Skin is warm and dry.     Nursing note and vitals reviewed.   BP 128/82 mmHg  Pulse 80  Ht 5' 3.5" (1.613 m)  Wt 176 lb (79.833 kg)  BMI 30.68 kg/m2  Assessment and Plan: 1. Hx: UTI (urinary tract infection) UA essentially negative Patient is no longer symptomatic so will monitor for worsening without treatment - POC urinalysis w microscopic (non auto)  2. Essential (primary) hypertension controlled  3. Onychomycosis of left great toe Pt reassured - should gently file down surface of nail to avoid snagging  4. Right knee pain I did not encourage her to consider surgery  Halina Maidens, MD [facility]  10/25/2015

## 2015-11-03 ENCOUNTER — Telehealth: Payer: Self-pay

## 2015-11-03 ENCOUNTER — Ambulatory Visit: Payer: Self-pay | Admitting: Internal Medicine

## 2015-11-03 NOTE — Telephone Encounter (Signed)
Patient daughter [name] called asking if Benifiber and Magn Cal B complex are safe with all other meds she is on. I instructed her to call pharmasist about that and advised I will let PCP know that she is taking this if ok with Pharmasist.

## 2015-11-03 NOTE — Telephone Encounter (Signed)
There should not be any problem with these supplements and her current medications.  However, I do recommend taking the supplements separate from the prescriptions.

## 2015-11-14 ENCOUNTER — Encounter: Payer: Self-pay | Admitting: Podiatry

## 2015-11-14 ENCOUNTER — Ambulatory Visit (INDEPENDENT_AMBULATORY_CARE_PROVIDER_SITE_OTHER): Payer: Medicare Other | Admitting: Podiatry

## 2015-11-14 VITALS — BP 147/91 | HR 80 | Resp 18

## 2015-11-14 DIAGNOSIS — M79674 Pain in right toe(s): Secondary | ICD-10-CM

## 2015-11-14 DIAGNOSIS — B351 Tinea unguium: Secondary | ICD-10-CM

## 2015-11-14 DIAGNOSIS — M79675 Pain in left toe(s): Secondary | ICD-10-CM | POA: Diagnosis not present

## 2015-11-14 DIAGNOSIS — L601 Onycholysis: Secondary | ICD-10-CM | POA: Diagnosis not present

## 2015-11-14 NOTE — Progress Notes (Signed)
   Subjective:    Patient ID: Rebecca Wolf, female    DOB: 1924-12-07, 82 y.o.   MRN: [id number]  HPI  82 year old female presents the office today with concerns of thick, painful, elongated toenails that she cannot trim herself trigger her left big toenail. She states a month ago she stubbed her toe and part of the nail came off however part of it has remained in his thick and painful with shoe gear and pressure. Denies any redness or drainage from the sites. No other complaints.   Review of Systems  All other systems reviewed and are negative.      Objective:   Physical Exam General: AAO x3, NAD  Dermatological: Nails are hypertrophic, dystrophic, brittle, discolored, elongated 10. Particular left hallux toenail is brittle and loose. There is tenderness nails 1-5 bilaterally. No swelling redness or drainage. No open lesions identified.  Vascular: DP/PT pulse 1/4  There is no pain with calf compression, swelling, warmth, erythema.   Neruologic: Sensation mildly decreased with Sims once the monofilament, decreased vibratory sensation.   Musculoskeletahammertoes are present. There is no pain, crepitus, or limitation noted with foot and ankle range of motion bilateral. Muscular strength 5/5 in all groups tested bilateral.  Gait: Unassisted, Nonantalgic.      Assessment & Plan:   82 year old female with symptomatic onychomycosis, left hallux onycholysis  -Treatment options discussed including all alternatives, risks, and complications -Etiology of symptoms were discussed -Nails debrided 10 without complications or bleeding. Left hallux toenails debrided of all loose nail. No bleeding occurred from the debridement.  -Daily foot inspection  -Follow up in 3 months or sooner  if any issues are to arise.   Celesta Gentile, DPM

## 2015-11-18 ENCOUNTER — Other Ambulatory Visit: Payer: Self-pay | Admitting: Internal Medicine

## 2015-12-29 ENCOUNTER — Other Ambulatory Visit: Payer: Self-pay | Admitting: Internal Medicine

## 2016-01-03 ENCOUNTER — Encounter: Payer: Self-pay | Admitting: Internal Medicine

## 2016-01-03 ENCOUNTER — Ambulatory Visit (INDEPENDENT_AMBULATORY_CARE_PROVIDER_SITE_OTHER): Payer: Medicare Other | Admitting: Internal Medicine

## 2016-01-03 VITALS — BP 130/76 | HR 68 | Ht 63.5 in | Wt 179.0 lb

## 2016-01-03 DIAGNOSIS — I8393 Asymptomatic varicose veins of bilateral lower extremities: Secondary | ICD-10-CM

## 2016-01-03 DIAGNOSIS — R140 Abdominal distension (gaseous): Secondary | ICD-10-CM | POA: Diagnosis not present

## 2016-01-03 DIAGNOSIS — I10 Essential (primary) hypertension: Secondary | ICD-10-CM

## 2016-01-03 DIAGNOSIS — I8390 Asymptomatic varicose veins of unspecified lower extremity: Secondary | ICD-10-CM

## 2016-01-03 DIAGNOSIS — N3000 Acute cystitis without hematuria: Secondary | ICD-10-CM

## 2016-01-03 NOTE — Progress Notes (Signed)
Date:  01/03/2016   Name:  Rebecca Wolf   DOB:  April 03, 1925   MRN:  [id number]   Chief Complaint: Urinary Tract Infection Urinary Tract Infection  This is a new problem. The current episode started in the past 7 days. The patient is experiencing no pain. Associated symptoms include frequency (initially at night 3-4 times, now normalized). Pertinent negatives include no chills, hematuria, nausea or vomiting.  Hypertension This is a chronic problem. The current episode started more than 1 year ago. The problem is unchanged. The problem is controlled. Pertinent negatives include no chest pain, palpitations or shortness of breath. Past treatments include calcium channel blockers and diuretics.   Post prandial bloating - not associated with nausea or diarrhea.  Eating the same except for more salads in attempt to lose weight. Has not lost weight.  Also taking a fiber supplement.  Varicose veins/edema - intermittent issues.  Left leg most recently swollen but improved overnight.  She denies increase in sodium.  Drinking water rather than soda.  No calf pain, redness or tenderness.  Review of Systems  Constitutional: Negative for fever, chills and fatigue.  Respiratory: Negative for chest tightness and shortness of breath.   Cardiovascular: Positive for leg swelling. Negative for chest pain and palpitations.  Gastrointestinal: Positive for abdominal distention. Negative for nausea, vomiting, abdominal pain, diarrhea, constipation and blood in stool.  Genitourinary: Positive for frequency (initially at night 3-4 times, now normalized). Negative for hematuria, decreased urine volume, vaginal discharge and vaginal pain.  Skin: Positive for color change (lesion on chest - tender).  Psychiatric/Behavioral: Negative for dysphoric mood. The patient is not nervous/anxious.     Patient Active Problem List   Diagnosis Date Noted  . Partial symptomatic epilepsy with complex partial seizures, not  intractable, without status epilepticus ([place]) 06/30/2015  . Bronchiectasis without complication ([place]) [id number]  . Imbalance 11/08/2014  . Essential (primary) hypertension 11/08/2014  . Personal history of malignant neoplasm of breast 11/08/2014  . History of malignant carcinoid tumor of bronchus and lung 11/08/2014  . H/O peptic ulcer 11/08/2014  . Asthma, mild intermittent 11/08/2014  . PE (pulmonary embolism) 11/08/2014  . Gonalgia 11/08/2014  . Leg varices 11/08/2014    Prior to Admission medications   Medication Sig Start Date End Date Taking? Authorizing Provider  albuterol (ACCUNEB) 1.25 MG/3ML nebulizer solution Inhale 1 ampule into the lungs 4 (four) times daily as needed. 05/06/14  Yes Historical Provider, MD  albuterol (PROVENTIL HFA;VENTOLIN HFA) 108 (90 BASE) MCG/ACT inhaler Inhale 1 puff into the lungs 4 (four) times daily.   Yes Historical Provider, MD  amLODipine (NORVASC) 5 MG tablet TAKE 1 TABLET BY MOUTH EVERY DAY 07/24/15  Yes Glean Hess, MD  ELIQUIS 2.5 MG TABS tablet TAKE 1 TABLET BY MOUTH TWICE DAILY 09/25/15  Yes Glean Hess, MD  fluticasone [facility]) 50 MCG/ACT nasal spray Place 2 sprays into the nose daily.   Yes Historical Provider, MD  Fluticasone-Salmeterol (ADVAIR) 500-50 MCG/DOSE AEPB Inhale 1 puff into the lungs 2 (two) times daily.   Yes Historical Provider, MD  hydrocortisone cream 1 % Apply 1 application topically 2 (two) times daily. 12/13/14  Yes Glean Hess, MD  levETIRAcetam (KEPPRA) 750 MG tablet Take 1 tablet by mouth 2 (two) times daily. 06/29/13  Yes Historical Provider, MD  montelukast (SINGULAIR) 10 MG tablet Take 1 tablet by mouth daily.   Yes Historical Provider, MD  nystatin (MYCOSTATIN) powder Apply topically 3 (three) times daily. 09/25/15  Yes Lequita Asal, MD  Olopatadine HCl 0.6 % SOLN Place 1 spray into the nose daily.   Yes Historical Provider, MD  pantoprazole (PROTONIX) 40 MG tablet TAKE 1 TABLET BY MOUTH TWICE DAILY  11/18/15  Yes Glean Hess, MD  spironolactone-hydrochlorothiazide (ALDACTAZIDE) 25-25 MG tablet Take 1 tablet by mouth daily. 06/30/15  Yes Glean Hess, MD    Allergies  Allergen Reactions  . Levofloxacin   . Sulfa Antibiotics     Past Surgical History  Procedure Laterality Date  . Thoracotomy/lobectomy  1999    carcinoid tumor  . Hemicolectomy Right     bowel obstruction  . Total vaginal hysterectomy    . Varicose vein surgery    . Hemorrhoid surgery    . Breast lumpectomy Left 2004/2005    cancer  . Breast excisional biopsy Left 2003    positive  . Breast excisional biopsy Left 2004    positive    Social History  Substance Use Topics  . Smoking status: Former Research scientist (life sciences)  . Smokeless tobacco: None  . Alcohol Use: No     Medication list has been reviewed and updated.   Physical Exam  Constitutional: She is oriented to person, place, and time. She appears well-developed. No distress.  HENT:  Head: Normocephalic and atraumatic.  Cardiovascular: Normal rate, regular rhythm and normal heart sounds.   Pulmonary/Chest: Effort normal and breath sounds normal. No respiratory distress. She has no wheezes.  Abdominal: Soft. There is no tenderness.  Musculoskeletal: Normal range of motion. Edema: trace edema both anterior shins.       Right lower leg: She exhibits no bony tenderness and no swelling.       Left lower leg: She exhibits no tenderness and no swelling.  Neurological: She is alert and oriented to person, place, and time.  Skin: Skin is warm and dry. Rash noted. Rash is papular.     Psychiatric: She has a normal mood and affect. Her behavior is normal. Thought content normal.  Nursing note and vitals reviewed.   BP 130/76 mmHg  Pulse 68  Ht 5' 3.5" (1.613 m)  Wt 179 lb (81.194 kg)  BMI 31.21 kg/m2  Assessment and Plan: 1. Acute cystitis without hematuria UA negative - trace WBC due to vaginal contamination Antibiotics not indicated - POC  urinalysis w microscopic (non auto)  2. Leg varices Stable; continue anticoagulation for recurrent DVT No evidence of DVT today  3. Essential (primary) hypertension controlled  4. Postprandial bloating Likely due to change in diet with increased fiber - recommend stopping fiber supplement   Halina Maidens, MD [facility]  01/03/2016

## 2016-02-06 DIAGNOSIS — L538 Other specified erythematous conditions: Secondary | ICD-10-CM | POA: Diagnosis not present

## 2016-02-06 DIAGNOSIS — L821 Other seborrheic keratosis: Secondary | ICD-10-CM | POA: Diagnosis not present

## 2016-02-06 DIAGNOSIS — S20309A Unspecified superficial injuries of unspecified front wall of thorax, initial encounter: Secondary | ICD-10-CM | POA: Diagnosis not present

## 2016-02-06 DIAGNOSIS — L820 Inflamed seborrheic keratosis: Secondary | ICD-10-CM | POA: Diagnosis not present

## 2016-02-06 DIAGNOSIS — L298 Other pruritus: Secondary | ICD-10-CM | POA: Diagnosis not present

## 2016-02-15 ENCOUNTER — Ambulatory Visit: Payer: Medicare Other | Admitting: Podiatry

## 2016-02-19 ENCOUNTER — Telehealth: Payer: Self-pay | Admitting: Hematology and Oncology

## 2016-02-19 NOTE — Telephone Encounter (Signed)
  We see patient once a year. At last visit, had no issues.  M

## 2016-02-19 NOTE — Telephone Encounter (Signed)
Called [name] to schedule mammo for pt. Was told they need to confirm that she is not having new breast issues before they schedule. Rebecca Wolf said the patient can call her directly to schedule. I lvm for pt and provided phone number and reminder that she will need mammo before appt in March, 2018. Asked her to call [facility] so that they can confirm whether or not she has had any new breast problems, and to set up appt. Thanks.

## 2016-02-20 NOTE — Telephone Encounter (Signed)
I called the pt's home.  Spoke to daughter [name] and pt did have mammogram 10/03/2015, she is due to have one next year after 10/02/16 and at the time of appt this year it was too far out to have mammogram scheduled.  Then while I was on phone with [facility] from mammography she states that the mammogram in system will have expired before the test would be done.  I went in and expanded the date of service and the date of expiration.  Melissa thinks that will fix everything and she will speak to Roselyn Reef first and then call pt with date for next year mammogram.

## 2016-02-28 ENCOUNTER — Other Ambulatory Visit: Payer: Self-pay | Admitting: Internal Medicine

## 2016-03-12 DIAGNOSIS — R05 Cough: Secondary | ICD-10-CM | POA: Diagnosis not present

## 2016-03-13 DIAGNOSIS — J4520 Mild intermittent asthma, uncomplicated: Secondary | ICD-10-CM | POA: Diagnosis not present

## 2016-03-13 DIAGNOSIS — R05 Cough: Secondary | ICD-10-CM | POA: Diagnosis not present

## 2016-04-05 ENCOUNTER — Ambulatory Visit (INDEPENDENT_AMBULATORY_CARE_PROVIDER_SITE_OTHER): Payer: Medicare Other | Admitting: Podiatry

## 2016-04-05 ENCOUNTER — Encounter: Payer: Self-pay | Admitting: Podiatry

## 2016-04-05 DIAGNOSIS — M79676 Pain in unspecified toe(s): Secondary | ICD-10-CM | POA: Diagnosis not present

## 2016-04-05 DIAGNOSIS — M4800 Spinal stenosis, site unspecified: Secondary | ICD-10-CM | POA: Diagnosis not present

## 2016-04-05 DIAGNOSIS — B351 Tinea unguium: Secondary | ICD-10-CM | POA: Diagnosis not present

## 2016-04-05 DIAGNOSIS — M79609 Pain in unspecified limb: Principal | ICD-10-CM

## 2016-04-05 DIAGNOSIS — L603 Nail dystrophy: Secondary | ICD-10-CM

## 2016-04-05 DIAGNOSIS — L608 Other nail disorders: Secondary | ICD-10-CM

## 2016-04-05 DIAGNOSIS — G609 Hereditary and idiopathic neuropathy, unspecified: Secondary | ICD-10-CM

## 2016-04-05 NOTE — Patient Instructions (Signed)
Neuropathic Pain Neuropathic pain is pain caused by damage to the nerves that are responsible for certain sensations in your body (sensory nerves). The pain can be caused by damage to:   The sensory nerves that send signals to your spinal cord and brain (peripheral nervous system).  The sensory nerves in your brain or spinal cord (central nervous system). Neuropathic pain can make you more sensitive to pain. What would be a minor sensation for most people may feel very painful if you have neuropathic pain. This is usually a long-term condition that can be difficult to treat. The type of pain can differ from person to person. It may start suddenly (acute), or it may develop slowly and last for a long time (chronic). Neuropathic pain may come and go as damaged nerves heal or may stay at the same level for years. It often causes emotional distress, loss of sleep, and a lower quality of life. CAUSES  The most common cause of damage to a sensory nerve is diabetes. Many other diseases and conditions can also cause neuropathic pain. Causes of neuropathic pain can be classified as:  Toxic. Many drugs and chemicals can cause toxic damage. The most common cause of toxic neuropathic pain is damage from drug treatment for cancer (chemotherapy).  Metabolic. This type of pain can happen when a disease causes imbalances that damage nerves. Diabetes is the most common of these diseases. Vitamin B deficiency caused by long-term alcohol abuse is another common cause.  Traumatic. Any injury that cuts, crushes, or stretches a nerve can cause damage and pain. A common example is feeling pain after losing an arm or leg (phantom limb pain).  Compression-related. If a sensory nerve gets trapped or compressed for a long period of time, the blood supply to the nerve can be cut off.  Vascular. Many blood vessel diseases can cause neuropathic pain by decreasing blood supply and oxygen to nerves.  Autoimmune. This type of  pain results from diseases in which the body's defense system mistakenly attacks sensory nerves. Examples of autoimmune diseases that can cause neuropathic pain include lupus and multiple sclerosis.  Infectious. Many types of viral infections can damage sensory nerves and cause pain. Shingles infection is a common cause of this type of pain.  Inherited. Neuropathic pain can be a symptom of many diseases that are passed down through families (genetic). SIGNS AND SYMPTOMS  The main symptom is pain. Neuropathic pain is often described as:  Burning.  Shock-like.  Stinging.  Hot or cold.  Itching. DIAGNOSIS  No single test can diagnose neuropathic pain. Your health care provider will do a physical exam and ask you about your pain. You may use a pain scale to describe how bad your pain is. You may also have tests to see if you have a high sensitivity to pain and to help find the cause and location of any sensory nerve damage. These tests may include:  Imaging studies, such as:  X-rays.  CT scan.  MRI.  Nerve conduction studies to test how well nerve signals travel through your sensory nerves (electrodiagnostic testing).  Stimulating your sensory nerves through electrodes on your skin and measuring the response in your spinal cord and brain (somatosensory evoked potentials). TREATMENT  Treatment for neuropathic pain may change over time. You may need to try different treatment options or a combination of treatments. Some options include:  Over-the-counter pain relievers.  Prescription medicines. Some medicines used to treat other conditions may also help neuropathic pain. These   include medicines to:  Control seizures (anticonvulsants).  Relieve depression (antidepressants).  Prescription-strength pain relievers (narcotics). These are usually used when other pain relievers do not help.  Transcutaneous nerve stimulation (TENS). This uses electrical currents to block painful nerve  signals. The treatment is painless.  Topical and local anesthetics. These are medicines that numb the nerves. They can be injected as a nerve block or applied to the skin.  Alternative treatments, such as:  Acupuncture.  Meditation.  Massage.  Physical therapy.  Pain management programs.  Counseling. HOME CARE INSTRUCTIONS  Learn as much as you can about your condition.  Take medicines only as directed by your health care provider.  Work closely with all your health care providers to find what works best for you.  Have a good support system at home.  Consider joining a chronic pain support group. SEEK MEDICAL CARE IF:  Your pain treatments are not helping.  You are having side effects from your medicines.  You are struggling with fatigue, mood changes, depression, or anxiety.   This information is not intended to replace advice given to you by your health care provider. Make sure you discuss any questions you have with your health care provider.   Document Released: 03/14/2004 Document Revised: 07/08/2014 Document Reviewed: 11/25/2013 Elsevier Interactive Patient Education 2016 [employer]vier Inc[employer]  

## 2016-04-06 NOTE — Progress Notes (Signed)
SUBJECTIVE Patient  presents to office today complaining of elongated, thickened nails. Pain while ambulating in shoes. Patient is unable to trim their own nails.  Patient also complains of a history of spinal stenosis with peripheral neuropathy to the bilateral lower extremities.  OBJECTIVE General Patient is awake, alert, and oriented x 3 and in no acute distress. Derm Skin is dry and supple bilateral. Negative open lesions or macerations. Remaining integument unremarkable. Nails are tender, long, thickened and dystrophic with subungual debris, consistent with onychomycosis, 1-5 bilateral. No signs of infection noted. Vasc  DP and PT pedal pulses palpable bilaterally. Temperature gradient within normal limits.  Neuro Epicritic and protective threshold sensation diminished bilaterally.  Musculoskeletal Exam No symptomatic pedal deformities noted bilateral. Muscular strength within normal limits.  ASSESSMENT 1. Onychodystrophic nails 1-5 bilateral with hyperkeratosis of nails.  2. Onychomycosis of nail due to dermatophyte bilateral 3. Pain in foot bilateral 4. Neuropathy secondary to spinal stenosis bilateral lower extremities. 5. Spinal stenosis  PLAN OF CARE 1. Patient evaluated today.  2. Instructed to maintain good pedal hygiene and foot care.  3. Mechanical debridement of nails 1-5 bilaterally performed using a nail nipper. Filed with dremel without incident.  4. Today prescription for diabetic neuropathic pain cream was dispensed through [facility] 5. Return to clinic in 3 mos.    Edrick Kins, DPM

## 2016-04-14 ENCOUNTER — Emergency Department
Admission: EM | Admit: 2016-04-14 | Discharge: 2016-04-14 | Disposition: A | Discharge status: Home / Self Care | Payer: Medicare Other | Attending: Emergency Medicine | Admitting: Emergency Medicine

## 2016-04-14 ENCOUNTER — Emergency Department: Payer: Medicare Other

## 2016-04-14 ENCOUNTER — Encounter: Payer: Self-pay | Admitting: Emergency Medicine

## 2016-04-14 DIAGNOSIS — R918 Other nonspecific abnormal finding of lung field: Secondary | ICD-10-CM

## 2016-04-14 DIAGNOSIS — R42 Dizziness and giddiness: Secondary | ICD-10-CM | POA: Diagnosis present

## 2016-04-14 DIAGNOSIS — Z79899 Other long term (current) drug therapy: Secondary | ICD-10-CM | POA: Diagnosis not present

## 2016-04-14 DIAGNOSIS — R079 Chest pain, unspecified: Secondary | ICD-10-CM | POA: Diagnosis not present

## 2016-04-14 DIAGNOSIS — S299XXA Unspecified injury of thorax, initial encounter: Secondary | ICD-10-CM | POA: Diagnosis not present

## 2016-04-14 DIAGNOSIS — R911 Solitary pulmonary nodule: Secondary | ICD-10-CM | POA: Insufficient documentation

## 2016-04-14 DIAGNOSIS — S199XXA Unspecified injury of neck, initial encounter: Secondary | ICD-10-CM | POA: Diagnosis not present

## 2016-04-14 DIAGNOSIS — I10 Essential (primary) hypertension: Secondary | ICD-10-CM | POA: Diagnosis not present

## 2016-04-14 DIAGNOSIS — J4520 Mild intermittent asthma, uncomplicated: Secondary | ICD-10-CM | POA: Insufficient documentation

## 2016-04-14 DIAGNOSIS — Z85118 Personal history of other malignant neoplasm of bronchus and lung: Secondary | ICD-10-CM | POA: Insufficient documentation

## 2016-04-14 DIAGNOSIS — Z853 Personal history of malignant neoplasm of breast: Secondary | ICD-10-CM | POA: Diagnosis not present

## 2016-04-14 DIAGNOSIS — Z87891 Personal history of nicotine dependence: Secondary | ICD-10-CM | POA: Diagnosis not present

## 2016-04-14 DIAGNOSIS — S0990XA Unspecified injury of head, initial encounter: Secondary | ICD-10-CM | POA: Diagnosis not present

## 2016-04-14 DIAGNOSIS — I2699 Other pulmonary embolism without acute cor pulmonale: Secondary | ICD-10-CM | POA: Diagnosis not present

## 2016-04-14 LAB — BASIC METABOLIC PANEL
ANION GAP: 8 (ref 5–15)
BUN: 24 mg/dL — ABNORMAL HIGH (ref 6–20)
CHLORIDE: 105 mmol/L (ref 101–111)
CO2: 25 mmol/L (ref 22–32)
Calcium: 9.3 mg/dL (ref 8.9–10.3)
Creatinine, Ser: 1.01 mg/dL — ABNORMAL HIGH (ref 0.44–1.00)
GFR calc Af Amer: 55 mL/min — ABNORMAL LOW (ref >60)
GFR, EST NON AFRICAN AMERICAN: 48 mL/min — AB (ref >60)
Glucose, Bld: 139 mg/dL — ABNORMAL HIGH (ref 65–99)
POTASSIUM: 3.7 mmol/L (ref 3.5–5.1)
SODIUM: 138 mmol/L (ref 135–145)

## 2016-04-14 LAB — CBC
HEMATOCRIT: 38 % (ref 35.0–47.0)
HEMOGLOBIN: 12.4 g/dL (ref 12.0–16.0)
MCH: 25.4 pg — ABNORMAL LOW (ref 26.0–34.0)
MCHC: 32.5 g/dL (ref 32.0–36.0)
MCV: 78.2 fL — ABNORMAL LOW (ref 80.0–100.0)
Platelets: 207 10*3/uL (ref 150–440)
RBC: 4.86 MIL/uL (ref 3.80–5.20)
RDW: 14.6 % — ABNORMAL HIGH (ref 11.5–14.5)
WBC: 6.5 10*3/uL (ref 3.6–11.0)

## 2016-04-14 LAB — TROPONIN I: Troponin I: <0.03 ng/mL (ref <0.03)

## 2016-04-14 MED ORDER — ASPIRIN 81 MG PO CHEW
324.0000 mg | CHEWABLE_TABLET | Freq: Once | ORAL | Status: AC
  Administered 2016-04-14: 324 mg via ORAL
  Filled 2016-04-14: qty 4

## 2016-04-14 MED ORDER — SODIUM CHLORIDE 0.9 % IV BOLUS (SEPSIS)
500.0000 mL | Freq: Once | INTRAVENOUS | Status: AC
  Administered 2016-04-14: 500 mL via INTRAVENOUS

## 2016-04-14 MED ORDER — IOPAMIDOL (ISOVUE-370) INJECTION 76%
75.0000 mL | Freq: Once | INTRAVENOUS | Status: AC | PRN
  Administered 2016-04-14: 75 mL via INTRAVENOUS

## 2016-04-14 NOTE — ED Notes (Signed)
Patient transported to CT 

## 2016-04-14 NOTE — Discharge Instructions (Signed)
Your CT scan showed multiple lung nodules and enlarged lymph nodes which could be consistent with cancer. Therefore it is imperative that you follow up with your pulmonologist as soon as you can for further evaluation of these findings. Make sure to call Dr. Etta Quill office first thing in the morning for a close follow up. In the meantime return to the emergency room if you have any new episodes of chest pain, palpitations, shortness of breath, or if you feel like her going to pass out.

## 2016-04-14 NOTE — ED Triage Notes (Signed)
Patient to ER for c/o chest pain. Patient states approx 45 mins ago, she began having left arm pain that then radiated to left chest. Patient reports having diaphoresis, nausea, and dizziness at time of incident. Patient states chest pain has resolved but still "feels woozy". Patient reports fall a couple of days ago as well as fall today. Did hit head today. No visual injury noted.

## 2016-04-14 NOTE — ED Notes (Signed)
RN went in to start IV for CT angio, patient daughter request to speak with MD before RN starts IV, daughter has concerns about patient getting IV dye and why she needs to have the scan done. RN informed patient and daughter that she would have MD come back in and speak with them about the test.  Dr. Alfred Levins informed and at bedside with patient and daughter.

## 2016-04-14 NOTE — ED Provider Notes (Signed)
[facility] Emergency Department Provider Note  ____________________________________________  Time seen: Approximately 4:45 PM  I have reviewed the triage vital signs and the nursing notes.   HISTORY  Chief Complaint Chest Pain   HPI Rebecca Wolf is a 82 y.o. female with a history of seizures, breast cancer status post lumpectomy and radiation, carcinoid tumor status post resection, GERD, hypertension, and peptic ulcer disease who presents for evaluation of chest pain. Patient reports sudden onset of left arm achiness at 1PM that then moved moved on to her chest associted with the dizziness and nausea. Patient reports that she felt awful for about 20 minutes and symptoms resolved. Never had anything like this before. No personal or family history of ischemic heart disease. Former smoker. Patient compliant with blood thinners for prior history of PE and DVT. Patient has a history of breast cancer status post lumpectomy and radiation in 2003 and 2004 and also carcinoid tumor of the lung status post resection in 2001. She has no symptoms at this time. No fever or chills, no coughing. Patient also reports that she hit her head today while coming out of the bathroom. She was trying to get her walker when she turned her head and hit it on the wall. No LOC, no fall.  Past Medical History:  Diagnosis Date  . Asthma   . Breast cancer ([place])    2003 and 2004, radiation and chemo for 2004 breast cancer  . 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  . GERD (gastroesophageal reflux disease)   . Hemorrhoids   . Hypertension   . Polyp of colon 2002   bleeding polyp of right ascending colon  . PUD (peptic ulcer disease)   . Seizures ([place])    epilepsy well controlled    Patient Active Problem List   Diagnosis Date Noted  . Partial symptomatic epilepsy with complex partial seizures, not intractable, without status epilepticus ([place]) 06/30/2015  . Bronchiectasis  without complication ([place]) [id number]  . Imbalance 11/08/2014  . Essential (primary) hypertension 11/08/2014  . Personal history of malignant neoplasm of breast 11/08/2014  . History of malignant carcinoid tumor of bronchus and lung 11/08/2014  . H/O peptic ulcer 11/08/2014  . Asthma, mild intermittent 11/08/2014  . PE (pulmonary embolism) 11/08/2014  . Gonalgia 11/08/2014  . Leg varices 11/08/2014    Past Surgical History:  Procedure Laterality Date  . BREAST EXCISIONAL BIOPSY Left 2003   positive  . BREAST EXCISIONAL BIOPSY Left 2004   positive  . BREAST LUMPECTOMY Left 2004/2005   cancer  . HEMICOLECTOMY Right    bowel obstruction  . HEMORRHOID SURGERY    . THORACOTOMY/LOBECTOMY  1999   carcinoid tumor  . TOTAL VAGINAL HYSTERECTOMY    . VARICOSE VEIN SURGERY      Prior to Admission medications   Medication Sig Start Date End Date Taking? Authorizing Provider  albuterol (ACCUNEB) 1.25 MG/3ML nebulizer solution Inhale 1 ampule into the lungs 4 (four) times daily as needed. 05/06/14   Historical Provider, MD  albuterol (PROVENTIL HFA;VENTOLIN HFA) 108 (90 BASE) MCG/ACT inhaler Inhale 1 puff into the lungs 4 (four) times daily.    Historical Provider, MD  amLODipine (NORVASC) 5 MG tablet TAKE 1 TABLET BY MOUTH EVERY DAY 07/24/15   Glean Hess, MD  ELIQUIS 2.5 MG TABS tablet TAKE 1 TABLET BY MOUTH TWICE DAILY 09/25/15   Glean Hess, MD  fluticasone [facility]) 50 MCG/ACT nasal spray Place 2 sprays into the  nose daily.    Historical Provider, MD  Fluticasone-Salmeterol (ADVAIR) 500-50 MCG/DOSE AEPB Inhale 1 puff into the lungs 2 (two) times daily.    Historical Provider, MD  hydrocortisone cream 1 % Apply 1 application topically 2 (two) times daily. 12/13/14   Glean Hess, MD  levETIRAcetam (KEPPRA) 750 MG tablet Take 1 tablet by mouth 2 (two) times daily. 06/29/13   Historical Provider, MD  montelukast (SINGULAIR) 10 MG tablet Take 1 tablet by mouth daily.    Historical  Provider, MD  nystatin (MYCOSTATIN) powder Apply topically 3 (three) times daily. 09/25/15   Lequita Asal, MD  Olopatadine HCl 0.6 % SOLN Place 1 spray into the nose daily.    Historical Provider, MD  pantoprazole (PROTONIX) 40 MG tablet TAKE 1 TABLET BY MOUTH TWICE DAILY 11/18/15   Glean Hess, MD  spironolactone-hydrochlorothiazide (ALDACTAZIDE) 25-25 MG tablet Take 1 tablet by mouth daily. 06/30/15   Glean Hess, MD  spironolactone-hydrochlorothiazide (ALDACTAZIDE) 25-25 MG tablet TAKE 1 TABLET BY MOUTH DAILY 02/28/16   Glean Hess, MD    Allergies Levofloxacin and Sulfa antibiotics  Family History  Problem Relation Age of Onset  . Alcoholism Father   . Diabetes Sister     Social History Social History  Substance Use Topics  . Smoking status: Former Research scientist (life sciences)  . Smokeless tobacco: Never Used  . Alcohol use No    Review of Systems  Constitutional: Negative for fever.+ Dizziness Eyes: Negative for visual changes. ENT: Negative for sore throat. Cardiovascular: + chest pain. Respiratory: Negative for shortness of breath. Gastrointestinal: Negative for abdominal pain, vomiting or diarrhea. + nausea Genitourinary: Negative for dysuria. Musculoskeletal: Negative for back pain. Skin: Negative for rash. Neurological: Negative for headaches, weakness or numbness.  ____________________________________________   PHYSICAL EXAM:  VITAL SIGNS: ED Triage Vitals  Enc Vitals Group     BP 04/14/16 1329 129/63     Pulse Rate 04/14/16 1329 67     Resp 04/14/16 1329 18     Temp 04/14/16 1329 97.6 F (36.4 C)     Temp Source 04/14/16 1329 Oral     SpO2 04/14/16 1329 96 %     Weight 04/14/16 1330 182 lb (82.6 kg)     Height 04/14/16 1330 5\' 3"  (1.6 m)     Head Circumference --      Peak Flow --      Pain Score --      Pain Loc --      Pain Edu? --      Excl. in [place]? --     Constitutional: Alert and oriented. Well appearing and in no apparent distress. HEENT:       Head: Normocephalic and atraumatic.         Eyes: Conjunctivae are normal. Sclera is non-icteric. EOMI. PERRL      Mouth/Throat: Mucous membranes are moist.       Neck: Supple with no signs of meningismus. Cardiovascular: Regular rate and rhythm. No murmurs, gallops, or rubs. 2+ symmetrical distal pulses are present in all extremities. No JVD. BP L arm 117/64 and R arm 131/73 Respiratory: Normal respiratory effort. Lungs are clear to auscultation bilaterally. No wheezes, crackles, or rhonchi.  Gastrointestinal: Soft, non tender, and non distended with positive bowel sounds. No rebound or guarding. Musculoskeletal: Nontender with normal range of motion in all extremities. No edema, cyanosis, or erythema of extremities. Neurologic: Normal speech and language. Face is symmetric. Moving all extremities. No gross focal neurologic deficits  are appreciated. Skin: Skin is warm, dry and intact. No rash noted. Psychiatric: Mood and affect are normal. Speech and behavior are normal.  ____________________________________________   LABS (all labs ordered are listed, but only abnormal results are displayed)  Labs Reviewed  BASIC METABOLIC PANEL - Abnormal; Notable for the following:       Result Value   Glucose, Bld 139 (*)    BUN 24 (*)    Creatinine, Ser 1.01 (*)    GFR calc non Af Amer 48 (*)    GFR calc Af Amer 55 (*)    All other components within normal limits  CBC - Abnormal; Notable for the following:    MCV 78.2 (*)    MCH 25.4 (*)    RDW 14.6 (*)    All other components within normal limits  TROPONIN I  TROPONIN I   ____________________________________________  EKG  ED ECG REPORT I, Rudene Re, the attending physician, personally viewed and interpreted this ECG.  Normal sinus rhythm, first-degree AV block, rate of 66, normal QRS and QTc intervals, left axis deviation, no ST elevations or depressions. No prior for  comparison. ____________________________________________  RADIOLOGY  CXR:  1. Ill-defined right middle lobe airspace disease raise concern for early infection. 2. Emphysema. 3. Atherosclerosis of the thoracic aorta  CTA chest: Multiple bilateral pulmonary nodules the largest is in the right upper lobe measuring approximately 0.8 x 0.5 cm with associated mediastinal and hilar lymphadenopathy, the largest lymph node approximately 3.2 x 2 x 3.1 cm in the right upper peritracheal region.  Bilateral upper lobe and right middle lobe subpleural areas of scarring with left upper lobe bronchiectasis. Minimal airspace disease inferomedially in the right lower lobe.  No large central pulmonary embolus.  Coronary arteriosclerosis.  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 ____________________________________________   PROCEDURES  Procedure(s) performed: None Procedures Critical Care performed:  None ____________________________________________   INITIAL IMPRESSION / ASSESSMENT AND PLAN / ED COURSE   82 y.o. female with a history of seizures, breast cancer status post lumpectomy and radiation, carcinoid tumor status post resection, GERD, hypertension, and peptic ulcer disease who presents for evaluation of short lived episode of left arm pain radiating to the chest associated with dizziness and nausea. Patient is pain-free at this time. History is concerning for ACS. Initial EKG with no deficits of ischemia. Patient does have a symmetric upper extremity bilateral blood pressure and also history of PE. We'll pursue CT of the chest to rule out dissection and PE. First troponin is negative. We'll watch patient for cardiac telemetry.  Clinical Course  Comment By Time  CT showing multiple pulmonary nodules and lymphadenopathy. Discussed these findings with patient and her daughter and recommended close f/u with patient pulmonologist for further evaluation. I discussed  with them that these could consistent with malignancy and further work up needs to be done. No evidence of PE or dissection or PNA. First troponin negative. I discussed with patient that I was really concerned that her presentation was due to ACS and recommended that she be admitted for further evaluation including a stress test, echocardiogram, and or cardiology consult. Patient is adamant that she does not want to stay. She is a patient of Dr. Clayborn Bigness and wishes to go home and have the evaluation done as outpatient. Her daughter is comfortable with the plan and she lives with the patient and will bring patient back if she develops new chest pain. I explained to patient  and her daughter the risks of going home including heart attack and death and they both understand the risks. 2nd troponin is due (6 hours from when patient was chest pain free). I spoke with Dr. Saralyn Pilar on call for Dr. Clayborn Bigness who is okay with patient going home and recommended her calling the office Monday morning for close follow up. Patient and daughter are comfortable with this plan. Will dc if 2nd troponin is negative. No further episodes of CP here. Rudene Re, MD 10/15 1922    Pertinent labs & imaging results that were available during my care of the patient were reviewed by me and considered in my medical decision making (see chart for details).    ____________________________________________   FINAL CLINICAL IMPRESSION(S) / ED DIAGNOSES  Final diagnoses:  Chest pain, unspecified type  Pulmonary nodules      NEW MEDICATIONS STARTED DURING THIS VISIT:  New Prescriptions   No medications on file     Note:  This document was prepared using Dragon voice recognition software and may include unintentional dictation errors.    Rudene Re, MD 04/14/16 2008

## 2016-04-16 DIAGNOSIS — Z86718 Personal history of other venous thrombosis and embolism: Secondary | ICD-10-CM | POA: Diagnosis not present

## 2016-04-16 DIAGNOSIS — R0602 Shortness of breath: Secondary | ICD-10-CM | POA: Diagnosis not present

## 2016-04-16 DIAGNOSIS — K219 Gastro-esophageal reflux disease without esophagitis: Secondary | ICD-10-CM | POA: Diagnosis not present

## 2016-04-16 DIAGNOSIS — R911 Solitary pulmonary nodule: Secondary | ICD-10-CM | POA: Diagnosis not present

## 2016-04-16 DIAGNOSIS — R011 Cardiac murmur, unspecified: Secondary | ICD-10-CM | POA: Diagnosis not present

## 2016-04-16 DIAGNOSIS — E669 Obesity, unspecified: Secondary | ICD-10-CM | POA: Diagnosis not present

## 2016-04-16 DIAGNOSIS — I208 Other forms of angina pectoris: Secondary | ICD-10-CM | POA: Diagnosis not present

## 2016-04-16 DIAGNOSIS — R601 Generalized edema: Secondary | ICD-10-CM | POA: Diagnosis not present

## 2016-04-16 DIAGNOSIS — G40309 Generalized idiopathic epilepsy and epileptic syndromes, not intractable, without status epilepticus: Secondary | ICD-10-CM | POA: Diagnosis not present

## 2016-04-16 DIAGNOSIS — R9431 Abnormal electrocardiogram [ECG] [EKG]: Secondary | ICD-10-CM | POA: Diagnosis not present

## 2016-04-17 ENCOUNTER — Emergency Department: Payer: Medicare Other

## 2016-04-17 ENCOUNTER — Encounter: Payer: Self-pay | Admitting: *Deleted

## 2016-04-17 ENCOUNTER — Observation Stay
Admission: EM | Admit: 2016-04-17 | Discharge: 2016-04-18 | Disposition: A | Discharge status: Home / Self Care | Payer: Medicare Other | Attending: Internal Medicine | Admitting: Internal Medicine

## 2016-04-17 DIAGNOSIS — M79602 Pain in left arm: Secondary | ICD-10-CM | POA: Insufficient documentation

## 2016-04-17 DIAGNOSIS — Z86711 Personal history of pulmonary embolism: Secondary | ICD-10-CM | POA: Insufficient documentation

## 2016-04-17 DIAGNOSIS — Z7951 Long term (current) use of inhaled steroids: Secondary | ICD-10-CM | POA: Diagnosis not present

## 2016-04-17 DIAGNOSIS — Z85110 Personal history of malignant carcinoid tumor of bronchus and lung: Secondary | ICD-10-CM | POA: Diagnosis not present

## 2016-04-17 DIAGNOSIS — Z923 Personal history of irradiation: Secondary | ICD-10-CM | POA: Insufficient documentation

## 2016-04-17 DIAGNOSIS — E860 Dehydration: Secondary | ICD-10-CM | POA: Insufficient documentation

## 2016-04-17 DIAGNOSIS — Z79899 Other long term (current) drug therapy: Secondary | ICD-10-CM | POA: Insufficient documentation

## 2016-04-17 DIAGNOSIS — Z8711 Personal history of peptic ulcer disease: Secondary | ICD-10-CM | POA: Diagnosis not present

## 2016-04-17 DIAGNOSIS — I200 Unstable angina: Secondary | ICD-10-CM | POA: Diagnosis not present

## 2016-04-17 DIAGNOSIS — M25512 Pain in left shoulder: Secondary | ICD-10-CM | POA: Diagnosis not present

## 2016-04-17 DIAGNOSIS — R0789 Other chest pain: Secondary | ICD-10-CM

## 2016-04-17 DIAGNOSIS — Z853 Personal history of malignant neoplasm of breast: Secondary | ICD-10-CM | POA: Diagnosis not present

## 2016-04-17 DIAGNOSIS — J4520 Mild intermittent asthma, uncomplicated: Secondary | ICD-10-CM | POA: Insufficient documentation

## 2016-04-17 DIAGNOSIS — J984 Other disorders of lung: Secondary | ICD-10-CM | POA: Diagnosis not present

## 2016-04-17 DIAGNOSIS — I10 Essential (primary) hypertension: Secondary | ICD-10-CM | POA: Insufficient documentation

## 2016-04-17 DIAGNOSIS — G40209 Localization-related (focal) (partial) symptomatic epilepsy and epileptic syndromes with complex partial seizures, not intractable, without status epilepticus: Secondary | ICD-10-CM | POA: Insufficient documentation

## 2016-04-17 DIAGNOSIS — R079 Chest pain, unspecified: Principal | ICD-10-CM | POA: Diagnosis present

## 2016-04-17 DIAGNOSIS — Z7901 Long term (current) use of anticoagulants: Secondary | ICD-10-CM | POA: Insufficient documentation

## 2016-04-17 DIAGNOSIS — Z87891 Personal history of nicotine dependence: Secondary | ICD-10-CM | POA: Diagnosis not present

## 2016-04-17 DIAGNOSIS — K219 Gastro-esophageal reflux disease without esophagitis: Secondary | ICD-10-CM | POA: Insufficient documentation

## 2016-04-17 LAB — BASIC METABOLIC PANEL
Anion gap: 7 (ref 5–15)
BUN: 23 mg/dL — AB (ref 6–20)
CO2: 28 mmol/L (ref 22–32)
CREATININE: 1.05 mg/dL — AB (ref 0.44–1.00)
Calcium: 9.5 mg/dL (ref 8.9–10.3)
Chloride: 104 mmol/L (ref 101–111)
GFR calc Af Amer: 53 mL/min — ABNORMAL LOW (ref >60)
GFR, EST NON AFRICAN AMERICAN: 46 mL/min — AB (ref >60)
GLUCOSE: 135 mg/dL — AB (ref 65–99)
POTASSIUM: 4.4 mmol/L (ref 3.5–5.1)
Sodium: 139 mmol/L (ref 135–145)

## 2016-04-17 LAB — CBC
HCT: 39 % (ref 35.0–47.0)
Hemoglobin: 12.6 g/dL (ref 12.0–16.0)
MCH: 25.4 pg — ABNORMAL LOW (ref 26.0–34.0)
MCHC: 32.4 g/dL (ref 32.0–36.0)
MCV: 78.4 fL — AB (ref 80.0–100.0)
PLATELETS: 223 10*3/uL (ref 150–440)
RBC: 4.98 MIL/uL (ref 3.80–5.20)
RDW: 14.8 % — ABNORMAL HIGH (ref 11.5–14.5)
WBC: 7 10*3/uL (ref 3.6–11.0)

## 2016-04-17 LAB — TROPONIN I: Troponin I: <0.03 ng/mL (ref <0.03)

## 2016-04-17 MED ORDER — APIXABAN 2.5 MG PO TABS
2.5000 mg | ORAL_TABLET | Freq: Two times a day (BID) | ORAL | Status: DC
  Administered 2016-04-17: 2.5 mg via ORAL
  Filled 2016-04-17: qty 1

## 2016-04-17 MED ORDER — LEVETIRACETAM 750 MG PO TABS
750.0000 mg | ORAL_TABLET | Freq: Two times a day (BID) | ORAL | Status: DC
  Administered 2016-04-17 – 2016-04-18 (×2): 750 mg via ORAL
  Filled 2016-04-17 (×3): qty 1

## 2016-04-17 MED ORDER — MOMETASONE FURO-FORMOTEROL FUM 200-5 MCG/ACT IN AERO
2.0000 | INHALATION_SPRAY | Freq: Two times a day (BID) | RESPIRATORY_TRACT | Status: DC
  Administered 2016-04-17: 2 via RESPIRATORY_TRACT
  Filled 2016-04-17: qty 8.8

## 2016-04-17 MED ORDER — ASPIRIN EC 325 MG PO TBEC
325.0000 mg | DELAYED_RELEASE_TABLET | Freq: Every day | ORAL | Status: DC
  Administered 2016-04-18: 325 mg via ORAL
  Filled 2016-04-17: qty 1

## 2016-04-17 MED ORDER — ALBUTEROL SULFATE (2.5 MG/3ML) 0.083% IN NEBU
2.5000 mg | INHALATION_SOLUTION | Freq: Four times a day (QID) | RESPIRATORY_TRACT | Status: DC
  Filled 2016-04-17: qty 3

## 2016-04-17 MED ORDER — FLUTICASONE PROPIONATE 50 MCG/ACT NA SUSP
2.0000 | Freq: Every day | NASAL | Status: DC
  Administered 2016-04-18: 2 via NASAL
  Filled 2016-04-17: qty 16

## 2016-04-17 MED ORDER — ACETAMINOPHEN 325 MG PO TABS
650.0000 mg | ORAL_TABLET | ORAL | Status: DC | PRN
  Administered 2016-04-18: 650 mg via ORAL
  Filled 2016-04-17: qty 2

## 2016-04-17 MED ORDER — ALBUTEROL SULFATE (2.5 MG/3ML) 0.083% IN NEBU: 2.5000 mg | INHALATION_SOLUTION | Freq: Four times a day (QID) | RESPIRATORY_TRACT | Status: DC

## 2016-04-17 MED ORDER — GI COCKTAIL ~~LOC~~: 30.0000 mL | Freq: Four times a day (QID) | ORAL | Status: DC | PRN

## 2016-04-17 MED ORDER — MORPHINE SULFATE (PF) 2 MG/ML IV SOLN: 2.0000 mg | INTRAVENOUS | Status: DC | PRN

## 2016-04-17 MED ORDER — ONDANSETRON HCL 4 MG/2ML IJ SOLN: 4.0000 mg | Freq: Four times a day (QID) | INTRAMUSCULAR | Status: DC | PRN

## 2016-04-17 MED ORDER — ASPIRIN 81 MG PO CHEW
324.0000 mg | CHEWABLE_TABLET | Freq: Once | ORAL | Status: AC
  Administered 2016-04-17: 324 mg via ORAL
  Filled 2016-04-17: qty 4

## 2016-04-17 MED ORDER — MONTELUKAST SODIUM 10 MG PO TABS
10.0000 mg | ORAL_TABLET | Freq: Every day | ORAL | Status: DC
  Administered 2016-04-18: 10 mg via ORAL
  Filled 2016-04-17: qty 1

## 2016-04-17 MED ORDER — PANTOPRAZOLE SODIUM 40 MG PO TBEC
40.0000 mg | DELAYED_RELEASE_TABLET | Freq: Two times a day (BID) | ORAL | Status: DC
  Administered 2016-04-17 – 2016-04-18 (×2): 40 mg via ORAL
  Filled 2016-04-17 (×2): qty 1

## 2016-04-17 MED ORDER — METOPROLOL TARTRATE 25 MG PO TABS
12.5000 mg | ORAL_TABLET | Freq: Two times a day (BID) | ORAL | Status: DC
  Administered 2016-04-18: 12.5 mg via ORAL
  Filled 2016-04-17 (×2): qty 1

## 2016-04-17 MED ORDER — ALPRAZOLAM 0.5 MG PO TABS: 0.2500 mg | ORAL_TABLET | Freq: Two times a day (BID) | ORAL | Status: DC | PRN

## 2016-04-17 NOTE — ED Triage Notes (Signed)
States left arm pain this AM when she woke up this AM, states she felt dizzy and nauseas, states she was seen here Saturday with same symptoms, pt awake and alert in no acute distress

## 2016-04-17 NOTE — H&P (Signed)
[facility] [place] NAME: Rebecca Wolf    MR#:  [id number]  DATE OF BIRTH:  05-Jan-1925  DATE OF ADMISSION:  04/17/2016  PRIMARY CARE PHYSICIAN: Halina Maidens, MD   REQUESTING/REFERRING PHYSICIAN: Rudene Re, MD  CHIEF COMPLAINT:   Chief Complaint  Patient presents with  . Arm Pain  . Nausea   Left shoulder pain and nausea. HISTORY OF PRESENT ILLNESS:  Rebecca Wolf  is a 82 y.o. female with a known history of Hypertension, PE, PUD, GERD, breast cancer and seizure disorder. The patient to present to the ED with the left shoulder pain, which happened at 10:30 AM and is much worse then 3 days ago. The patient came to the ED for the similar symptoms 3 days ago. She was recommended to get a cardiac workup but the patient refused. She got CAT scan of chest which was negative. As per Dr. Alfred Levins,  Dr. Henreitta Cea saw the patient has unstable angina and scheduled for stress test as outpatient. The patient also complains of nausea but no vomiting, palpitation or diaphoresis. She was treated with 1 dose of aspirin in the ED.   PAST MEDICAL HISTORY:   Past Medical History:  Diagnosis Date  . Asthma   . Breast cancer ([place])    2003 and 2004, radiation and chemo for 2004 breast cancer  . 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  . GERD (gastroesophageal reflux disease)   . Hemorrhoids   . Hypertension   . Polyp of colon 2002   bleeding polyp of right ascending colon  . PUD (peptic ulcer disease)   . Seizures ([place])    epilepsy well controlled    PAST SURGICAL HISTORY:   Past Surgical History:  Procedure Laterality Date  . BREAST EXCISIONAL BIOPSY Left 2003   positive  . BREAST EXCISIONAL BIOPSY Left 2004   positive  . BREAST LUMPECTOMY Left 2004/2005   cancer  . HEMICOLECTOMY Right    bowel obstruction  . HEMORRHOID SURGERY    . THORACOTOMY/LOBECTOMY  1999   carcinoid tumor  . TOTAL VAGINAL HYSTERECTOMY    . VARICOSE  VEIN SURGERY      SOCIAL HISTORY:   Social History  Substance Use Topics  . Smoking status: Former Research scientist (life sciences)  . Smokeless tobacco: Never Used  . Alcohol use No    FAMILY HISTORY:   Family History  Problem Relation Age of Onset  . Alcoholism Father   . Diabetes Sister     DRUG ALLERGIES:   Allergies  Allergen Reactions  . Levofloxacin   . Sulfa Antibiotics     REVIEW OF SYSTEMS:   Review of Systems  Constitutional: Negative for chills, fever and malaise/fatigue.  HENT: Negative for sore throat.   Eyes: Negative for blurred vision and double vision.  Respiratory: Negative for cough, shortness of breath, wheezing and stridor.   Cardiovascular: Positive for leg swelling. Negative for chest pain and palpitations.  Gastrointestinal: Positive for nausea. Negative for abdominal pain, blood in stool, melena and vomiting.  Genitourinary: Negative for dysuria, frequency, hematuria and urgency.  Musculoskeletal: Positive for joint pain.       Left shoulder pain.  Neurological: Negative for dizziness, focal weakness, seizures, loss of consciousness and headaches.  Psychiatric/Behavioral: Negative for depression. The patient is not nervous/anxious.     MEDICATIONS AT HOME:   Prior to Admission medications   Medication Sig Start Date End Date Taking? Authorizing Provider  albuterol (ACCUNEB) 1.25 MG/3ML  nebulizer solution Inhale 1 ampule into the lungs 4 (four) times daily as needed. 05/06/14  Yes Historical Provider, MD  albuterol (PROVENTIL HFA;VENTOLIN HFA) 108 (90 BASE) MCG/ACT inhaler Inhale 1 puff into the lungs 4 (four) times daily.   Yes Historical Provider, MD  amLODipine (NORVASC) 5 MG tablet TAKE 1 TABLET BY MOUTH EVERY DAY 07/24/15  Yes Glean Hess, MD  ELIQUIS 2.5 MG TABS tablet TAKE 1 TABLET BY MOUTH TWICE DAILY 09/25/15  Yes Glean Hess, MD  fluticasone [facility]) 50 MCG/ACT nasal spray Place 2 sprays into the nose daily.   Yes Historical Provider, MD    Fluticasone-Salmeterol (ADVAIR) 500-50 MCG/DOSE AEPB Inhale 1 puff into the lungs 2 (two) times daily.   Yes Historical Provider, MD  levETIRAcetam (KEPPRA) 750 MG tablet Take 1 tablet by mouth 2 (two) times daily. 06/29/13  Yes Historical Provider, MD  montelukast (SINGULAIR) 10 MG tablet Take 1 tablet by mouth daily.   Yes Historical Provider, MD  nystatin (MYCOSTATIN) powder Apply topically 3 (three) times daily. 09/25/15  Yes Lequita Asal, MD  pantoprazole (PROTONIX) 40 MG tablet TAKE 1 TABLET BY MOUTH TWICE DAILY 11/18/15  Yes Glean Hess, MD  spironolactone-hydrochlorothiazide (ALDACTAZIDE) 25-25 MG tablet Take 1 tablet by mouth daily. 06/30/15  Yes Glean Hess, MD  hydrocortisone cream 1 % Apply 1 application topically 2 (two) times daily. Patient not taking: Reported on 04/17/2016 12/13/14   Glean Hess, MD      VITAL SIGNS:  Blood pressure (!) 124/56, pulse 64, temperature 98.1 F (36.7 C), temperature source Oral, resp. rate 17, height 5\' 3"  (1.6 m), weight 182 lb (82.6 kg), SpO2 96 %.  PHYSICAL EXAMINATION:  Physical Exam  GENERAL:  82 y.o.-year-old patient lying in the bed with no acute distress.  EYES: Pupils equal, round, reactive to light and accommodation. No scleral icterus. Extraocular muscles intact.  HEENT: Head atraumatic, normocephalic. Oropharynx and nasopharynx clear.  NECK:  Supple, no jugular venous distention. No thyroid enlargement, no tenderness.  LUNGS: Normal breath sounds bilaterally, no wheezing, rales,rhonchi or crepitation. No use of accessory muscles of respiration.  CARDIOVASCULAR: S1, S2 normal. No murmurs, rubs, or gallops.  ABDOMEN: Soft, nontender, nondistended. Bowel sounds present. No organomegaly or mass.  EXTREMITIES:  Bilateral lower extremity edema, no cyanosis, or clubbing.  no tenderness around the left shoulder.  NEUROLOGIC: Cranial nerves II through XII are intact. Muscle strength 5/5 in all extremities. Sensation  intact. Gait not checked.  PSYCHIATRIC: The patient is alert and oriented x 3.  SKIN: No obvious rash, lesion, or ulcer.   LABORATORY PANEL:   CBC  Recent Labs Lab 04/17/16 1219  WBC 7.0  HGB 12.6  HCT 39.0  PLT 223   ------------------------------------------------------------------------------------------------------------------  Chemistries   Recent Labs Lab 04/17/16 1219  NA 139  K 4.4  CL 104  CO2 28  GLUCOSE 135*  BUN 23*  CREATININE 1.05*  CALCIUM 9.5   ------------------------------------------------------------------------------------------------------------------  Cardiac Enzymes  Recent Labs Lab 04/17/16 1219  TROPONINI <0.03   ------------------------------------------------------------------------------------------------------------------  RADIOLOGY:  Dg Chest 2 View  Result Date: 04/17/2016 CLINICAL DATA:  Dizziness and nausea.  Former smoker. EXAM: CHEST  2 VIEW COMPARISON:  04/14/2016 chest CT FINDINGS: Right upper paramediastinal density along the wedge resection and postoperative findings in the right upper lobe. Stable blunting of the right lateral costophrenic angle with adjacent scarring. Scarring in the left upper lobe as on recent chest CT. The small pulmonary nodules shown on chest CT  are not well seen on conventional radiography. Atherosclerotic aortic arch. IMPRESSION: 1. Similar appearance of pleural thickening at the right lateral costophrenic angle along with right paramediastinal density along with postoperative findings, not appreciably changed from prior chest CT of 04/14/2016. There is some continued scarring in the left upper lobe and atherosclerotic calcification of the aortic arch. Electronically Signed   By: Van Clines M.D.   On: 04/17/2016 12:52      IMPRESSION AND PLAN:   Left shoulder pain, With possible angina. The patient will be placed for observation. Continued telemetry monitor, follow-up troponin level,  continue aspirin and eliquis, and stress test tomorrow and cardiology consult. Start low-dose Lopressor.  Dehydration. Hold diuretics and the follow-up BMP.   History of PE. Continue Eliquis.   hypertension. Hold hypertension medication except and Lopressor due to low side blood pressure.   Seizure disorder. Continue home medication.  All the records are reviewed and case discussed with ED provider. Management plans discussed with the patient, family and they are in agreement.  CODE STATUS: Full code  TOTAL TIME TAKING CARE OF THIS PATIENT: 50 minutes.    Demetrios Loll M.D on 04/17/2016 at 4:18 PM  Between 7am to 6pm - Pager - [phone]  After 6pm go to [email or web address] - Proofreader  [employer] [facility]  Office  [phone]  CC: Primary care physician; Halina Maidens, MD   Note: This dictation was prepared with Dragon dictation along with smaller phrase technology. Any transcriptional errors that result from this process are unintentional.

## 2016-04-17 NOTE — ED Provider Notes (Signed)
[facility] Provider Note  ____________________________________________  Time seen: Approximately 3:03 PM  I have reviewed the triage vital signs and the nursing notes.   HISTORY  Chief Complaint Arm Pain and Nausea   HPI Rebecca Wolf is a 82 y.o. female with a history of seizures, breast cancer status post lumpectomy and radiation, carcinoid tumor status post resection, GERD, hypertension, and peptic ulcer disease who presents for evaluation of left arm pain. Patient seen here 3 days ago with similar complaints at that time she was seen by me and I recommended admission for cardiac workup but patient refused. She underwent a CTA of her chest and 2 troponins that were negative. This morning at 10:30 she was getting up from her bed and she developed severe L arm dull pain associated with dizziness and nausea. Patient reports that she walked to the bathroom and then to the living room. She sat on the couch and then call her daughter for help. She reports that the pain lasted 15-20 minutes and he was much worse than the episode she had 3 days ago. She denies syncope, shortness of breath, vomiting, abdominal pain, back pain. She denies any further episodes of arm or chest pain.  Past Medical History:  Diagnosis Date  . Asthma   . Breast cancer ([facility])    2003 and 2004, radiation and chemo for 2004 breast cancer  . 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  . GERD (gastroesophageal reflux disease)   . Hemorrhoids   . Hypertension   . Polyp of colon 2002   bleeding polyp of right ascending colon  . PUD (peptic ulcer disease)   . Seizures ([place])    epilepsy well controlled    Patient Active Problem List   Diagnosis Date Noted  . Partial symptomatic epilepsy with complex partial seizures, not intractable, without status epilepticus ([facility]) 06/30/2015  . Bronchiectasis without complication ([place]) [id number]  . Imbalance 11/08/2014  .  Essential (primary) hypertension 11/08/2014  . Personal history of malignant neoplasm of breast 11/08/2014  . History of malignant carcinoid tumor of bronchus and lung 11/08/2014  . H/O peptic ulcer 11/08/2014  . Asthma, mild intermittent 11/08/2014  . PE (pulmonary embolism) 11/08/2014  . Gonalgia 11/08/2014  . Leg varices 11/08/2014    Past Surgical History:  Procedure Laterality Date  . BREAST EXCISIONAL BIOPSY Left 2003   positive  . BREAST EXCISIONAL BIOPSY Left 2004   positive  . BREAST LUMPECTOMY Left 2004/2005   cancer  . HEMICOLECTOMY Right    bowel obstruction  . HEMORRHOID SURGERY    . THORACOTOMY/LOBECTOMY  1999   carcinoid tumor  . TOTAL VAGINAL HYSTERECTOMY    . VARICOSE VEIN SURGERY      Prior to Admission medications   Medication Sig Start Date End Date Taking? Authorizing Provider  albuterol (ACCUNEB) 1.25 MG/3ML nebulizer solution Inhale 1 ampule into the lungs 4 (four) times daily as needed. 05/06/14   Historical Provider, MD  albuterol (PROVENTIL HFA;VENTOLIN HFA) 108 (90 BASE) MCG/ACT inhaler Inhale 1 puff into the lungs 4 (four) times daily.    Historical Provider, MD  amLODipine (NORVASC) 5 MG tablet TAKE 1 TABLET BY MOUTH EVERY DAY 07/24/15   Glean Hess, MD  ELIQUIS 2.5 MG TABS tablet TAKE 1 TABLET BY MOUTH TWICE DAILY 09/25/15   Glean Hess, MD  fluticasone [facility]) 50 MCG/ACT nasal spray Place 2 sprays into the nose daily.    Historical Provider, MD  Fluticasone-Salmeterol (ADVAIR) 500-50 MCG/DOSE AEPB Inhale 1 puff into the lungs 2 (two) times daily.    Historical Provider, MD  hydrocortisone cream 1 % Apply 1 application topically 2 (two) times daily. 12/13/14   Glean Hess, MD  levETIRAcetam (KEPPRA) 750 MG tablet Take 1 tablet by mouth 2 (two) times daily. 06/29/13   Historical Provider, MD  montelukast (SINGULAIR) 10 MG tablet Take 1 tablet by mouth daily.    Historical Provider, MD  nystatin (MYCOSTATIN) powder Apply topically 3  (three) times daily. 09/25/15   Lequita Asal, MD  Olopatadine HCl 0.6 % SOLN Place 1 spray into the nose daily.    Historical Provider, MD  pantoprazole (PROTONIX) 40 MG tablet TAKE 1 TABLET BY MOUTH TWICE DAILY 11/18/15   Glean Hess, MD  spironolactone-hydrochlorothiazide (ALDACTAZIDE) 25-25 MG tablet Take 1 tablet by mouth daily. 06/30/15   Glean Hess, MD  spironolactone-hydrochlorothiazide (ALDACTAZIDE) 25-25 MG tablet TAKE 1 TABLET BY MOUTH DAILY 02/28/16   Glean Hess, MD    Allergies Levofloxacin and Sulfa antibiotics  Family History  Problem Relation Age of Onset  . Alcoholism Father   . Diabetes Sister     Social History Social History  Substance Use Topics  . Smoking status: Former Research scientist (life sciences)  . Smokeless tobacco: Never Used  . Alcohol use No    Review of Systems  Constitutional: Negative for fever. + dizziness Eyes: Negative for visual changes. ENT: Negative for sore throat. Cardiovascular: Negative for chest pain. Respiratory: Negative for shortness of breath. Gastrointestinal: Negative for abdominal pain, vomiting or diarrhea. + nausea Genitourinary: Negative for dysuria. Musculoskeletal: Negative for back pain. + L arm pain Skin: Negative for rash. Neurological: Negative for headaches, weakness or numbness.  ____________________________________________   PHYSICAL EXAM:  VITAL SIGNS: ED Triage Vitals  Enc Vitals Group     BP 04/17/16 1216 118/60     Pulse Rate 04/17/16 1216 68     Resp 04/17/16 1216 18     Temp 04/17/16 1216 98.1 F (36.7 C)     Temp Source 04/17/16 1216 Oral     SpO2 04/17/16 1216 96 %     Weight 04/17/16 1217 182 lb (82.6 kg)     Height 04/17/16 1217 5\' 3"  (1.6 m)     Head Circumference --      Peak Flow --      Pain Score 04/17/16 1217 3     Pain Loc --      Pain Edu? --      Excl. in [place]? --     Constitutional: Alert and oriented. Well appearing and in no apparent distress. HEENT:      Head: Normocephalic  and atraumatic.         Eyes: Conjunctivae are normal. Sclera is non-icteric. EOMI. PERRL      Mouth/Throat: Mucous membranes are moist.       Neck: Supple with no signs of meningismus. Cardiovascular: Regular rate and rhythm. No murmurs, gallops, or rubs. 2+ symmetrical distal pulses are present in all extremities. No JVD. Respiratory: Normal respiratory effort. Lungs are clear to auscultation bilaterally. No wheezes, crackles, or rhonchi.  Gastrointestinal: Soft, non tender, and non distended with positive bowel sounds. No rebound or guarding. Musculoskeletal: Nontender with normal range of motion in all extremities. No edema, cyanosis, or erythema of extremities. Neurologic: Normal speech and language. Face is symmetric. Moving all extremities. No gross focal neurologic deficits are appreciated. Skin: Skin is warm, dry and intact. No  rash noted. Psychiatric: Mood and affect are normal. Speech and behavior are normal.  ____________________________________________   LABS (all labs ordered are listed, but only abnormal results are displayed)  Labs Reviewed  BASIC METABOLIC PANEL - Abnormal; Notable for the following:       Result Value   Glucose, Bld 135 (*)    BUN 23 (*)    Creatinine, Ser 1.05 (*)    GFR calc non Af Amer 46 (*)    GFR calc Af Amer 53 (*)    All other components within normal limits  CBC - Abnormal; Notable for the following:    MCV 78.4 (*)    MCH 25.4 (*)    RDW 14.8 (*)    All other components within normal limits  TROPONIN I   ____________________________________________  EKG  ED ECG REPORT I, Rudene Re, the attending physician, personally viewed and interpreted this ECG.  Normal sinus rhythm, rate of 60, first-degree AV block, normal QRS and QTc intervals, left axis deviation, no ST elevations or depressions. Unchanged from prior. ____________________________________________  RADIOLOGY  CXR:  Similar appearance of pleural thickening at the  right lateral costophrenic angle along with right paramediastinal density along with postoperative findings, not appreciably changed from prior chest CT of 04/14/2016. There is some continued scarring in the left upper lobe and atherosclerotic calcification of the aortic arch. ____________________________________________   PROCEDURES  Procedure(s) performed: None Procedures Critical Care performed:  None ____________________________________________   INITIAL IMPRESSION / ASSESSMENT AND PLAN / ED COURSE  82 y.o. female with a history of seizures, breast cancer status post lumpectomy and radiation, carcinoid tumor status post resection, GERD, hypertension, and peptic ulcer disease who presents for evaluation of one episode of left arm dull pain associated with dizziness and nausea. Patient seen here 3 days ago with similar symptoms. Had 2 troponins that were negative and a CT of the chest. I recommend admission at that time for cardiac evaluation however patient refused. She returns again with a more severe episode. Her EKG is nonischemic. Her first troponin is negative. She is currently pain-free. We'll discuss with the hospitalist for admission.  Clinical Course    Pertinent labs & imaging results that were available during my care of the patient were reviewed by me and considered in my medical decision making (see chart for details).    ____________________________________________   FINAL CLINICAL IMPRESSION(S) / ED DIAGNOSES  Final diagnoses:  Unstable angina (HCC)      NEW MEDICATIONS STARTED DURING THIS VISIT:  New Prescriptions   No medications on file     Note:  This document was prepared using Dragon voice recognition software and may include unintentional dictation errors.    Rudene Re, MD 04/17/16 [phone]

## 2016-04-17 NOTE — ED Notes (Signed)
Dr. Bridgett Larsson in room to assess patient.  Will continue to monitor.

## 2016-04-18 DIAGNOSIS — I208 Other forms of angina pectoris: Secondary | ICD-10-CM | POA: Diagnosis not present

## 2016-04-18 DIAGNOSIS — M25512 Pain in left shoulder: Secondary | ICD-10-CM | POA: Diagnosis not present

## 2016-04-18 DIAGNOSIS — R079 Chest pain, unspecified: Secondary | ICD-10-CM | POA: Diagnosis not present

## 2016-04-18 DIAGNOSIS — E860 Dehydration: Secondary | ICD-10-CM | POA: Diagnosis not present

## 2016-04-18 DIAGNOSIS — I10 Essential (primary) hypertension: Secondary | ICD-10-CM | POA: Diagnosis not present

## 2016-04-18 LAB — NM MYOCAR MULTI W/SPECT W/WALL MOTION / EF
CHL CUP RESTING HR STRESS: 66 {beats}/min
CSEPED: 1 min
CSEPEW: 1 METS
CSEPHR: 71 %
Exercise duration (sec): 0 s
LV sys vol: 17 mL
LVDIAVOL: 50 mL (ref 46–106)
MPHR: 131 {beats}/min
Peak HR: 94 {beats}/min
SDS: 2
SRS: 4
SSS: 4
TID: 0.89

## 2016-04-18 MED ORDER — REGADENOSON 0.4 MG/5ML IV SOLN
0.4000 mg | Freq: Once | INTRAVENOUS | Status: AC
  Administered 2016-04-18: 0.4 mg via INTRAVENOUS

## 2016-04-18 MED ORDER — ALBUTEROL SULFATE (2.5 MG/3ML) 0.083% IN NEBU: 2.5000 mg | INHALATION_SOLUTION | RESPIRATORY_TRACT | Status: DC | PRN

## 2016-04-18 MED ORDER — APIXABAN 2.5 MG PO TABS: 2.5000 mg | ORAL_TABLET | Freq: Two times a day (BID) | ORAL | Status: DC

## 2016-04-18 MED ORDER — TECHNETIUM TC 99M TETROFOSMIN IV KIT
13.5980 | PACK | Freq: Once | INTRAVENOUS | Status: AC | PRN
  Administered 2016-04-18: 13.598 via INTRAVENOUS

## 2016-04-18 MED ORDER — TECHNETIUM TC 99M TETROFOSMIN IV KIT
33.0000 | PACK | Freq: Once | INTRAVENOUS | Status: AC | PRN
  Administered 2016-04-18: 28.933 via INTRAVENOUS

## 2016-04-18 NOTE — Progress Notes (Signed)
Educated pt on agrastat infusion; call light and phone within reach

## 2016-04-18 NOTE — Discharge Summary (Signed)
[facility] [place] NAME: Rebecca Wolf    MR#:  [id number]  DATE OF BIRTH:  Mar 08, 1925  DATE OF ADMISSION:  04/17/2016 ADMITTING PHYSICIAN: Demetrios Loll, MD  DATE OF DISCHARGE: 04/18/2016  PRIMARY CARE PHYSICIAN: Halina Maidens, MD    ADMISSION DIAGNOSIS:  Unstable angina ([place]) [I20.0] Chest pain [R07.9]  DISCHARGE DIAGNOSIS:  Active Problems:   Chest pain   NM stress test is negative.  SECONDARY DIAGNOSIS:   Past Medical History:  Diagnosis Date  . Asthma   . Breast cancer ([facility])    2003 and 2004, radiation and chemo for 2004 breast cancer  . 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  . GERD (gastroesophageal reflux disease)   . Hemorrhoids   . Hypertension   . Polyp of colon 2002   bleeding polyp of right ascending colon  . PUD (peptic ulcer disease)   . Seizures ([place])    epilepsy well controlled    HOSPITAL COURSE:   Came to hospital with second time in last 1 week for having chest and left arm pain. She was already scheduled to have stress test as outpatient since last evening to 4 days ago. Seen by cardiologist and nuclear stress test done in the hospital which came out negative for any ischemic changes so cardiologist suggested to discharge without any medication changes. Otherwise patient remained stable.  DISCHARGE CONDITIONS:   Stable  CONSULTS OBTAINED:  Treatment Team:  Teodoro Spray, MD Yolonda Kida, MD  DRUG ALLERGIES:   Allergies  Allergen Reactions  . Levofloxacin   . Sulfa Antibiotics     DISCHARGE MEDICATIONS:   Current Discharge Medication List    CONTINUE these medications which have NOT CHANGED   Details  albuterol (ACCUNEB) 1.25 MG/3ML nebulizer solution Inhale 1 ampule into the lungs 4 (four) times daily as needed.    albuterol (PROVENTIL HFA;VENTOLIN HFA) 108 (90 BASE) MCG/ACT inhaler Inhale 1 puff into the lungs 4 (four) times daily.    amLODipine (NORVASC) 5 MG  tablet TAKE 1 TABLET BY MOUTH EVERY DAY Qty: 90 tablet, Refills: 3    ELIQUIS 2.5 MG TABS tablet TAKE 1 TABLET BY MOUTH TWICE DAILY Qty: 60 tablet, Refills: 12    fluticasone (FLONASE) 50 MCG/ACT nasal spray Place 2 sprays into the nose daily.    Fluticasone-Salmeterol (ADVAIR) 500-50 MCG/DOSE AEPB Inhale 1 puff into the lungs 2 (two) times daily.    levETIRAcetam (KEPPRA) 750 MG tablet Take 1 tablet by mouth 2 (two) times daily.    montelukast (SINGULAIR) 10 MG tablet Take 1 tablet by mouth daily.    nystatin (MYCOSTATIN) powder Apply topically 3 (three) times daily. Qty: 15 g, Refills: 0   Associated Diagnoses: Personal history of malignant neoplasm of breast    pantoprazole (PROTONIX) 40 MG tablet TAKE 1 TABLET BY MOUTH TWICE DAILY Qty: 60 tablet, Refills: 12    spironolactone-hydrochlorothiazide (ALDACTAZIDE) 25-25 MG tablet Take 1 tablet by mouth daily. Qty: 30 tablet, Refills: 5   Associated Diagnoses: Essential (primary) hypertension    hydrocortisone cream 1 % Apply 1 application topically 2 (two) times daily. Qty: 30 g, Refills: 0         DISCHARGE INSTRUCTIONS:    Follow with PMD in 2 weeks.  If you experience worsening of your admission symptoms, develop shortness of breath, life threatening emergency, suicidal or homicidal thoughts you must seek medical attention immediately by calling 911 or calling your MD immediately  if symptoms less severe.  You Must read complete instructions/literature along with all the possible adverse reactions/side effects for all the Medicines you take and that have been prescribed to you. Take any new Medicines after you have completely understood and accept all the possible adverse reactions/side effects.   Please note  You were cared for by a hospitalist during your hospital stay. If you have any questions about your discharge medications or the care you received while you were in the hospital after you are discharged, you can  call the unit and asked to speak with the hospitalist on call if the hospitalist that took care of you is not available. Once you are discharged, your primary care physician will handle any further medical issues. Please note that NO REFILLS for any discharge medications will be authorized once you are discharged, as it is imperative that you return to your primary care physician (or establish a relationship with a primary care physician if you do not have one) for your aftercare needs so that they can reassess your need for medications and monitor your lab values.    Today   CHIEF COMPLAINT:   Chief Complaint  Patient presents with  . Arm Pain  . Nausea    HISTORY OF PRESENT ILLNESS:  Rebecca Wolf  is a 82 y.o. female with a known history of Hypertension, PE, PUD, GERD, breast cancer and seizure disorder. The patient to present to the ED with the left shoulder pain, which happened at 10:30 AM and is much worse then 3 days ago. The patient came to the ED for the similar symptoms 3 days ago. She was recommended to get a cardiac workup but the patient refused. She got CAT scan of chest which was negative. As per Dr. Alfred Levins,  Dr. Henreitta Cea saw the patient has unstable angina and scheduled for stress test as outpatient. The patient also complains of nausea but no vomiting, palpitation or diaphoresis. She was treated with 1 dose of aspirin in the ED.   VITAL SIGNS:  Blood pressure 136/67, pulse 66, temperature 97.3 F (36.3 C), resp. rate 17, height 5\' 3"  (1.6 m), weight 83.6 kg (184 lb 4.8 oz), SpO2 100 %.  I/O:   Intake/Output Summary (Last 24 hours) at 04/18/16 1700 Last data filed at 04/18/16 1400  Gross per 24 hour  Intake              360 ml  Output                0 ml  Net              360 ml    PHYSICAL EXAMINATION:  GENERAL:  82 y.o.-year-old patient lying in the bed with no acute distress.  EYES: Pupils equal, round, reactive to light and accommodation. No scleral icterus.  Extraocular muscles intact.  HEENT: Head atraumatic, normocephalic. Oropharynx and nasopharynx clear.  NECK:  Supple, no jugular venous distention. No thyroid enlargement, no tenderness.  LUNGS: Normal breath sounds bilaterally, no wheezing, rales,rhonchi or crepitation. No use of accessory muscles of respiration.  CARDIOVASCULAR: S1, S2 normal. No murmurs, rubs, or gallops.  ABDOMEN: Soft, non-tender, non-distended. Bowel sounds present. No organomegaly or mass.  EXTREMITIES: No pedal edema, cyanosis, or clubbing.  NEUROLOGIC: Cranial nerves II through XII are intact. Muscle strength 5/5 in all extremities. Sensation intact. Gait not checked.  PSYCHIATRIC: The patient is alert and oriented x 3.  SKIN: No obvious rash, lesion, or ulcer.   DATA  REVIEW:   CBC  Recent Labs Lab 04/17/16 1219  WBC 7.0  HGB 12.6  HCT 39.0  PLT 223    Chemistries   Recent Labs Lab 04/17/16 1219  NA 139  K 4.4  CL 104  CO2 28  GLUCOSE 135*  BUN 23*  CREATININE 1.05*  CALCIUM 9.5    Cardiac Enzymes  Recent Labs Lab 04/17/16 1729  TROPONINI <0.03    Microbiology Results  Results for orders placed or performed in visit on 01/16/15  Urine Culture     Status: Abnormal   Collection Time: 01/16/15 12:00 AM  Result Value Ref Range Status   Urine Culture, Routine Final report (A)  Final   Urine Culture result 1 Proteus mirabilis (A)  Final    Comment: Greater than 100,000 colony forming units per mL   ANTIMICROBIAL SUSCEPTIBILITY Comment  Final    Comment:       ** S = Susceptible; I = Intermediate; R = Resistant **                    P = Positive; N = Negative             MICS are expressed in micrograms per mL    Antibiotic                 RSLT#1    RSLT#2    RSLT#3    RSLT#4 Amoxicillin/Clavulanic Acid    S Ampicillin                     S Cefepime                       S Ceftriaxone                    S Cefuroxime                     S Cephalothin                     S Ciprofloxacin                  S Ertapenem                      S Gentamicin                     S Levofloxacin                   S Nitrofurantoin                 R Piperacillin                   S Tetracycline                   R Tobramycin                     S Trimethoprim/Sulfa             S     RADIOLOGY:  Dg Chest 2 View  Result Date: 04/17/2016 CLINICAL DATA:  Dizziness and nausea.  Former smoker. EXAM: CHEST  2 VIEW COMPARISON:  04/14/2016 chest CT FINDINGS: Right upper paramediastinal density along the wedge resection and postoperative findings in the right upper lobe. Stable blunting of the right lateral costophrenic angle  with adjacent scarring. Scarring in the left upper lobe as on recent chest CT. The small pulmonary nodules shown on chest CT are not well seen on conventional radiography. Atherosclerotic aortic arch. IMPRESSION: 1. Similar appearance of pleural thickening at the right lateral costophrenic angle along with right paramediastinal density along with postoperative findings, not appreciably changed from prior chest CT of 04/14/2016. There is some continued scarring in the left upper lobe and atherosclerotic calcification of the aortic arch. Electronically Signed   By: Van Clines M.D.   On: 04/17/2016 12:52    EKG:   Orders placed or performed during the hospital encounter of 04/17/16  . ED EKG within 10 minutes  . ED EKG within 10 minutes  . EKG 12-Lead  . EKG 12-Lead      Management plans discussed with the patient, family and they are in agreement.  CODE STATUS: full.    Code Status Orders        Start     Ordered   04/17/16 1701  Full code  Continuous     04/17/16 1700    Code Status History    Date Active Date Inactive Code Status Order ID Comments User Context   This patient has a current code status but no historical code status.      TOTAL TIME TAKING CARE OF THIS PATIENT: 35 minutes.    Vaughan Basta M.D on  04/18/2016 at 5:00 PM  Between 7am to 6pm - Pager - [phone]  After 6pm go to [email or web address] - password [email or web address] [facility]  Office  [phone]  CC: Primary care physician; Halina Maidens, MD   Note: This dictation was prepared with Dragon dictation along with smaller phrase technology. Any transcriptional errors that result from this process are unintentional.

## 2016-04-18 NOTE — Care Management Obs Status (Signed)
MEDICARE OBSERVATION STATUS NOTIFICATION   Patient Details  Name: Rebecca Wolf MRN: [id number] Date of Birth: 04/03/1925   Medicare Observation Status Notification Given:  Yes    Beau Fanny, RN 04/18/2016, 8:44 AM

## 2016-04-18 NOTE — Progress Notes (Signed)
Pt. Has refused all nebulizer treatments. She states she rarely uses her albuterol inhaler at home. I suggested to her that the treatments could be changed to prn and if she were to feel SOB or wheezy she could have a treatment. She agreed to this.

## 2016-04-18 NOTE — Progress Notes (Signed)
Functional study showed noed ischemia. Noevidence of acs. WIll resume eliquis. OK for discharge from cardiac standpoint.

## 2016-04-18 NOTE — Progress Notes (Signed)
Back from nuclear medicine 

## 2016-04-18 NOTE — Progress Notes (Signed)
Pt discharged to home via wc.  Instructions  given to pt.  Questions answered.  No distress.  

## 2016-04-18 NOTE — Progress Notes (Signed)
To nuclear medicine via bed 

## 2016-04-18 NOTE — Progress Notes (Signed)
Consent for stress test signed by patient's daughter and placed on chart.

## 2016-04-18 NOTE — Consult Note (Signed)
Rebecca Wolf  CARDIOLOGY CONSULT NOTE  Patient ID: CHAPEL POKORNY MRN: [id number] DOB/AGE: 1925-04-25 82 y.o.  Admit date: 04/17/2016 Referring Physician Dr. Anselm Jungling Primary Physician Dr. Army Melia Primary Cardiologist Dr. Clayborn Bigness Reason for Consultation chest pain  HPI: 82 year old female with no cardiac cardiac history who was admitted with chest pain. Patient has a history of seizure disorder, breast carcinoma status post lumpectomy and radiation, carcinoid tumor status post resection, hypertension, history of pe on apixaban, peptic ulcer disease who began developing episodes of left arm aching and chest pain associated with dizziness and nausea and sensation of do She never had this previously. She was present for approximately 20 minutes and she presented to the emergency room where evaluation was unremarkable.EKG at that time was normal sinus rhythm with no ischemia. She also suffered a mechanical fall in her bathroom causing mild head trauma. She had a head ct at that time which was unremarkable. There was a 1.9 x 3.3 cm soft tissue mass in the medial pleural in the right apex of the lung. CT angio of the chest showed no pulmonary embolus with miltiple pulmonary nodules with associated mediastinal and hilar lymphadenopathy, largest of which was 3.2 x 2 x 3.1 cm in the right upper peritracheal region. There was a 2 cm x 0.5 cm aortic arch aneurysm. She deferred consideration for admission at that time and presented to Dr. Marianna Payment office in follow up ans was scheduled for nuclear stress test. She had recurrent pain in her left shoulder without chest pain yesterday and returned to the er. She again has ruled out for an mi and ekg was unchanged. She has been doing well since admission but had a brief episode of pain early this am. She has had no history of exertional pain and has not had a seizure like activity.  Review of Systems  Constitutional: Negative.    HENT: Negative.   Eyes: Negative.   Respiratory: Negative.   Cardiovascular: Positive for chest pain.  Gastrointestinal: Positive for nausea.  Genitourinary: Negative.   Musculoskeletal: Positive for joint pain.  Skin: Negative.   Neurological: Positive for dizziness.  Endo/Heme/Allergies: Negative.   Psychiatric/Behavioral: Negative.     Past Medical History:  Diagnosis Date  . Asthma   . Breast cancer ([place])    2003 and 2004, radiation and chemo for 2004 breast cancer  . 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  . GERD (gastroesophageal reflux disease)   . Hemorrhoids   . Hypertension   . Polyp of colon 2002   bleeding polyp of right ascending colon  . PUD (peptic ulcer disease)   . Seizures ([place])    epilepsy well controlled    Family History  Problem Relation Age of Onset  . Alcoholism Father   . Diabetes Sister     Social History   Social History  . Marital status: Married    Spouse name: N/A  . Number of children: N/A  . Years of education: N/A   Occupational History  . Not on file.   Social History Main Topics  . Smoking status: Former Research scientist (life sciences)  . Smokeless tobacco: Never Used  . Alcohol use No  . Drug use: No  . Sexual activity: Not on file   Other Topics Concern  . Not on file   Social History Narrative  . No narrative on file    Past Surgical History:  Procedure Laterality Date  . BREAST EXCISIONAL BIOPSY Left 2003  positive  . BREAST EXCISIONAL BIOPSY Left 2004   positive  . BREAST LUMPECTOMY Left 2004/2005   cancer  . HEMICOLECTOMY Right    bowel obstruction  . HEMORRHOID SURGERY    . THORACOTOMY/LOBECTOMY  1999   carcinoid tumor  . TOTAL VAGINAL HYSTERECTOMY    . VARICOSE VEIN SURGERY       Prescriptions Prior to Admission  Medication Sig Dispense Refill Last Dose  . albuterol (ACCUNEB) 1.25 MG/3ML nebulizer solution Inhale 1 ampule into the lungs 4 (four) times daily as needed.   prn at prn  . albuterol (PROVENTIL  HFA;VENTOLIN HFA) 108 (90 BASE) MCG/ACT inhaler Inhale 1 puff into the lungs 4 (four) times daily.   prn at prn  . amLODipine (NORVASC) 5 MG tablet TAKE 1 TABLET BY MOUTH EVERY DAY 90 tablet 3 04/16/2016 at Unknown time  . ELIQUIS 2.5 MG TABS tablet TAKE 1 TABLET BY MOUTH TWICE DAILY 60 tablet 12 04/16/2016 at 2000  . fluticasone (FLONASE) 50 MCG/ACT nasal spray Place 2 sprays into the nose daily.   prn at prn  . Fluticasone-Salmeterol (ADVAIR) 500-50 MCG/DOSE AEPB Inhale 1 puff into the lungs 2 (two) times daily.   04/16/2016 at 2000  . levETIRAcetam (KEPPRA) 750 MG tablet Take 1 tablet by mouth 2 (two) times daily.   04/16/2016 at 2000  . montelukast (SINGULAIR) 10 MG tablet Take 1 tablet by mouth daily.   04/16/2016 at Unknown time  . nystatin (MYCOSTATIN) powder Apply topically 3 (three) times daily. 15 g 0 prn at prn  . pantoprazole (PROTONIX) 40 MG tablet TAKE 1 TABLET BY MOUTH TWICE DAILY 60 tablet 12 04/16/2016 at Unknown time  . spironolactone-hydrochlorothiazide (ALDACTAZIDE) 25-25 MG tablet Take 1 tablet by mouth daily. 30 tablet 5 04/16/2016 at Unknown time  . hydrocortisone cream 1 % Apply 1 application topically 2 (two) times daily. (Patient not taking: Reported on 04/17/2016) 30 g 0 Not Taking at Unknown time    Physical Exam: Blood pressure (!) 115/57, pulse 67, temperature 97.5 F (36.4 C), temperature source Oral, resp. rate 14, height 5\' 3"  (1.6 m), weight 83.6 kg (184 lb 4.8 oz), SpO2 97 %.   Wt Readings from Last 1 Encounters:  04/17/16 83.6 kg (184 lb 4.8 oz)     General appearance: alert and cooperative Head: Normocephalic, without obvious abnormality, atraumatic Resp: clear to auscultation bilaterally Chest wall: no tenderness Cardio: regular rate and rhythm GI: soft, non-tender; bowel sounds normal; no masses,  no organomegaly Extremities: extremities normal, atraumatic, no cyanosis or edema Pulses: 2+ and symmetric Neurologic: Grossly normal  Labs:   Lab  Results  Component Value Date   WBC 7.0 04/17/2016   HGB 12.6 04/17/2016   HCT 39.0 04/17/2016   MCV 78.4 (L) 04/17/2016   PLT 223 04/17/2016    Recent Labs Lab 04/17/16 1219  NA 139  K 4.4  CL 104  CO2 28  BUN 23*  CREATININE 1.05*  CALCIUM 9.5  GLUCOSE 135*   Lab Results  Component Value Date   TROPONINI <0.03 04/17/2016       EKG: nsr with no ischemia  ASSESSMENT AND PLAN:  Pt with history of seiizure disorder and hypertension who was admitted with recurrent chest and left arm pain. Has ruled out for an mi. EKG unremarkable. Pain has typical and atypical features.  Also has nodularity in her right lung. Etiology unclear. Will proceed with functional study to determine if ischemia may be playing a role. Further recs after functional  study. Continue with asa, metoprolol 12.5 bid. Will hold apixaban for now until functional study is completed to determine if invasive cardiac evaluation may be necessary. Will add lovenox if functional study is abnormal.  Signed: Teodoro Spray MD, [facility] 04/18/2016, 7:35 AM

## 2016-04-18 NOTE — Progress Notes (Signed)
Pt called out to complain of recurrence of left arm pain radiating to the chest area; given 650 mg acetaminophen 1 hour prior; refused ordered PRN morphine; repositioned pt and gave her two extra pillows. Pt stated pain was at worst a 5 or 6 and subsides.

## 2016-04-18 NOTE — Discharge Instructions (Signed)
Patient Discharge   Rebecca Wolf / [id number] DOB: 07/17/1924   Admitted 04/17/2016 Discharged: 04/18/2016    Scheduled Meds:  albuterol  2.5 mg Inhalation QID   apixaban  2.5 mg Oral BID   aspirin EC  325 mg Oral Daily   fluticasone  2 spray Each Nare Daily   levETIRAcetam  750 mg Oral BID   metoprolol tartrate  12.5 mg Oral BID   mometasone-formoterol  2 puff Inhalation BID   montelukast  10 mg Oral Daily   pantoprazole  40 mg Oral BID   Continuous Infusions:  PRN Meds:acetaminophen, ALPRAZolam, gi cocktail, morphine injection, ondansetron (ZOFRAN) IV Personal Items   Please collect all clothing which belongs to you from your nurse. Please collect any valuables you stored during your stay from the front desk, and please remember all of your personal items, such as dentures, canes, and eyeglasses.

## 2016-04-19 ENCOUNTER — Other Ambulatory Visit: Payer: Self-pay | Admitting: Specialist

## 2016-04-19 DIAGNOSIS — J479 Bronchiectasis, uncomplicated: Secondary | ICD-10-CM | POA: Diagnosis not present

## 2016-04-19 DIAGNOSIS — R918 Other nonspecific abnormal finding of lung field: Secondary | ICD-10-CM

## 2016-04-19 DIAGNOSIS — J4520 Mild intermittent asthma, uncomplicated: Secondary | ICD-10-CM | POA: Diagnosis not present

## 2016-04-26 DIAGNOSIS — E669 Obesity, unspecified: Secondary | ICD-10-CM | POA: Diagnosis not present

## 2016-04-26 DIAGNOSIS — S4992XA Unspecified injury of left shoulder and upper arm, initial encounter: Secondary | ICD-10-CM | POA: Diagnosis not present

## 2016-04-26 DIAGNOSIS — M79602 Pain in left arm: Secondary | ICD-10-CM | POA: Diagnosis not present

## 2016-04-26 DIAGNOSIS — G40209 Localization-related (focal) (partial) symptomatic epilepsy and epileptic syndromes with complex partial seizures, not intractable, without status epilepticus: Secondary | ICD-10-CM | POA: Diagnosis not present

## 2016-05-01 ENCOUNTER — Ambulatory Visit: Payer: Self-pay | Admitting: Internal Medicine

## 2016-05-03 ENCOUNTER — Encounter: Payer: Self-pay | Admitting: Internal Medicine

## 2016-05-03 ENCOUNTER — Ambulatory Visit (INDEPENDENT_AMBULATORY_CARE_PROVIDER_SITE_OTHER): Payer: Medicare Other | Admitting: Internal Medicine

## 2016-05-03 VITALS — BP 120/78 | HR 64 | Resp 16 | Ht 63.0 in | Wt 184.0 lb

## 2016-05-03 DIAGNOSIS — R2689 Other abnormalities of gait and mobility: Secondary | ICD-10-CM | POA: Diagnosis not present

## 2016-05-03 DIAGNOSIS — Z23 Encounter for immunization: Secondary | ICD-10-CM | POA: Diagnosis not present

## 2016-05-03 DIAGNOSIS — I200 Unstable angina: Secondary | ICD-10-CM | POA: Diagnosis not present

## 2016-05-03 DIAGNOSIS — I10 Essential (primary) hypertension: Secondary | ICD-10-CM | POA: Diagnosis not present

## 2016-05-03 DIAGNOSIS — M79602 Pain in left arm: Secondary | ICD-10-CM

## 2016-05-03 DIAGNOSIS — R918 Other nonspecific abnormal finding of lung field: Secondary | ICD-10-CM | POA: Diagnosis not present

## 2016-05-03 NOTE — Progress Notes (Signed)
Date:  05/03/2016   Name:  Rebecca Wolf   DOB:  1925-05-25   MRN:  [id number]   Chief Complaint: Fall (Pain all over- had fall 3 weeks ago and has still had pain. See notes from [facility]. Neurology visit Tue.  )  Patient is here with her daughter to discuss left arm pain. About 3 weeks ago she slid down from the side of her bed and caught herself with her arms on the floor. She did not hit her head or have any obvious injury at that time. The next day she was walking and lost her balance at the door frame striking the side of her head. She went to the emergency room and had a CT scan which was normal and she was sent home. Several days later she began to have left arm pain that was not particularly associated with movement breathing or exertion. She was seen in the ER and admitted to rule out cardiac source. Myoview stress test was reported normal. CT scans were done to rule out aneurysm and pulmonary embolism. Plain film of her left arm was normal. The daughter reports ibuprofen heat and Tylenol all relieve her arm pain temporarily. Patient states is no particular pattern to the pain in her arm, she points to the muscles of the left mid arm both the biceps muscle and triceps muscle. She does depend on her arms for most of her mobility getting up from the toilet or chair and to ambulate with a Rollator. Daughter is concerned that there might be something missed by the [facility] and wonders whether she should take the risk and having a cardiac catheterization. She is also concerned about nausea that accompanies the arm pain but according to the patient resolves once the arm pain has responded to treatment. In addition to her hospital stay, the patient has been seen in follow-up by her pulmonologist and has a follow-up CT of the chest scheduled to follow nodules. Next also been seen by her neurologist who did not feel that this was a neurological condition. She has an upcoming appointment with  physiatry.  Review of Systems  Constitutional: Negative for chills, fatigue and fever.  Respiratory: Negative for chest tightness and shortness of breath.   Cardiovascular: Positive for leg swelling. Negative for chest pain.  Gastrointestinal: Positive for nausea. Negative for abdominal pain, diarrhea and vomiting.  Musculoskeletal: Positive for arthralgias, gait problem and myalgias.    Patient Active Problem List   Diagnosis Date Noted  . Obesity (BMI 30-39.9) 04/26/2016  . Multiple lung nodules 04/19/2016  . Chest pain 04/17/2016  . Partial symptomatic epilepsy with complex partial seizures, not intractable, without status epilepticus ([place]) 06/30/2015  . Bronchiectasis without complication ([place]) [id number]  . Imbalance 11/08/2014  . Essential (primary) hypertension 11/08/2014  . Personal history of malignant neoplasm of breast 11/08/2014  . History of malignant carcinoid tumor of bronchus and lung 11/08/2014  . H/O peptic ulcer 11/08/2014  . Asthma, mild intermittent 11/08/2014  . PE (pulmonary embolism) 11/08/2014  . Gonalgia 11/08/2014  . Leg varices 11/08/2014    Prior to Admission medications   Medication Sig Start Date End Date Taking? Authorizing Provider  albuterol (ACCUNEB) 1.25 MG/3ML nebulizer solution Inhale 1 ampule into the lungs 4 (four) times daily as needed. 05/06/14  Yes Historical Provider, MD  albuterol (PROVENTIL HFA;VENTOLIN HFA) 108 (90 BASE) MCG/ACT inhaler Inhale 1 puff into the lungs 4 (four) times daily.   Yes Historical Provider, MD  amLODipine (NORVASC)  5 MG tablet TAKE 1 TABLET BY MOUTH EVERY DAY 07/24/15  Yes Glean Hess, MD  ELIQUIS 2.5 MG TABS tablet TAKE 1 TABLET BY MOUTH TWICE DAILY 09/25/15  Yes Glean Hess, MD  fluticasone [facility]) 50 MCG/ACT nasal spray Place 2 sprays into the nose daily.   Yes Historical Provider, MD  Fluticasone-Salmeterol (ADVAIR) 500-50 MCG/DOSE AEPB Inhale 1 puff into the lungs 2 (two) times daily.   Yes  Historical Provider, MD  hydrocortisone cream 1 % Apply 1 application topically 2 (two) times daily. 12/13/14  Yes Glean Hess, MD  Ibuprofen 200 MG CAPS Take by mouth.   Yes Historical Provider, MD  levETIRAcetam (KEPPRA) 750 MG tablet Take 1 tablet by mouth 2 (two) times daily. 06/29/13  Yes Historical Provider, MD  montelukast (SINGULAIR) 10 MG tablet Take 1 tablet by mouth daily.   Yes Historical Provider, MD  NONFORMULARY OR COMPOUNDED ITEM Peripheral Neuropathy Cream-[facility] 2 refills   Yes Historical Provider, MD  nystatin (MYCOSTATIN) powder Apply topically 3 (three) times daily. 09/25/15  Yes Lequita Asal, MD  pantoprazole (PROTONIX) 40 MG tablet TAKE 1 TABLET BY MOUTH TWICE DAILY 11/18/15  Yes Glean Hess, MD  spironolactone-hydrochlorothiazide (ALDACTAZIDE) 25-25 MG tablet Take 1 tablet by mouth daily. 06/30/15  Yes Glean Hess, MD    Allergies  Allergen Reactions  . Levofloxacin   . Sulfa Antibiotics     Past Surgical History:  Procedure Laterality Date  . BREAST EXCISIONAL BIOPSY Left 2003   positive  . BREAST EXCISIONAL BIOPSY Left 2004   positive  . BREAST LUMPECTOMY Left 2004/2005   cancer  . HEMICOLECTOMY Right    bowel obstruction  . HEMORRHOID SURGERY    . THORACOTOMY/LOBECTOMY  1999   carcinoid tumor  . TOTAL VAGINAL HYSTERECTOMY    . VARICOSE VEIN SURGERY      Social History  Substance Use Topics  . Smoking status: Former Research scientist (life sciences)  . Smokeless tobacco: Never Used  . Alcohol use No     Medication list has been reviewed and updated.   Physical Exam  Constitutional: She is oriented to person, place, and time. She appears well-developed. No distress.  HENT:  Head: Normocephalic and atraumatic.  Neck: No thyromegaly present.  Cardiovascular: Normal rate, regular rhythm and normal heart sounds.   Pulmonary/Chest: Effort normal and breath sounds normal. No respiratory distress. She has no wheezes.  Musculoskeletal: Normal  range of motion.       Left shoulder: She exhibits normal range of motion (pain in triceps muscle with internal rotation) and no tenderness.       Left elbow: Normal.       Left wrist: Normal.  Tender to palpitation over left biceps and triceps muscles mid portion.  Lymphadenopathy:    She has no cervical adenopathy.  Neurological: She is alert and oriented to person, place, and time.  Skin: Skin is warm and dry. No rash noted.  Psychiatric: She has a normal mood and affect. Her behavior is normal. Thought content normal.  Nursing note and vitals reviewed.   BP 120/78   Pulse 64   Resp 16   Ht 5\' 3"  (1.6 m)   Wt 184 lb (83.5 kg) Comment: last weight noted  SpO2 98%   BMI 32.59 kg/m   Assessment and Plan: 1. Pain of left upper extremity Appears to be muscular - continue advil, tylenol and heat  2. Need for influenza vaccination - Flu Vaccine QUAD 36+ mos IM  3. Multiple lung nodules Followed by Pulmonary and recently stable  4. Essential (primary) hypertension controlled  5. Imbalance Continue assistance at home; rollator for ambulation   Halina Maidens, MD [facility]  05/03/2016

## 2016-05-07 ENCOUNTER — Telehealth: Payer: Self-pay

## 2016-05-07 ENCOUNTER — Other Ambulatory Visit: Payer: Self-pay | Admitting: Internal Medicine

## 2016-05-07 DIAGNOSIS — R2689 Other abnormalities of gait and mobility: Secondary | ICD-10-CM

## 2016-05-07 NOTE — Telephone Encounter (Signed)
Would like referral to PT in [place] area.

## 2016-05-07 NOTE — Telephone Encounter (Signed)
Referral sent to PT for scheduling.

## 2016-05-17 ENCOUNTER — Other Ambulatory Visit: Payer: Self-pay | Admitting: Internal Medicine

## 2016-05-17 ENCOUNTER — Telehealth: Payer: Self-pay

## 2016-05-17 ENCOUNTER — Encounter: Payer: Self-pay | Admitting: Internal Medicine

## 2016-05-17 DIAGNOSIS — K649 Unspecified hemorrhoids: Secondary | ICD-10-CM | POA: Insufficient documentation

## 2016-05-17 MED ORDER — HYDROCORTISONE 1 % EX CREA: 1.0000 "application " | TOPICAL_CREAM | Freq: Two times a day (BID) | CUTANEOUS | 0 refills | Status: DC

## 2016-05-17 NOTE — Telephone Encounter (Signed)
Sounds like she has hemorrhoids and can use the hydrocortisone cream.  If they are still a problem, she would need to see General Surgery, not GI.

## 2016-05-17 NOTE — Telephone Encounter (Signed)
Spoke to daughter who says Dr.Oh retired and all the docs there are NP or PA she wants  New ref to Dr. Allen Norris if possible

## 2016-05-17 NOTE — Telephone Encounter (Signed)
Can patient use Hydrocortizone for Anal Prolapse?  Advise to call GI Dr Candace Cruise at [facility].

## 2016-05-31 DIAGNOSIS — I208 Other forms of angina pectoris: Secondary | ICD-10-CM | POA: Diagnosis not present

## 2016-05-31 DIAGNOSIS — R601 Generalized edema: Secondary | ICD-10-CM | POA: Diagnosis not present

## 2016-05-31 DIAGNOSIS — R0602 Shortness of breath: Secondary | ICD-10-CM | POA: Diagnosis not present

## 2016-05-31 DIAGNOSIS — R011 Cardiac murmur, unspecified: Secondary | ICD-10-CM | POA: Diagnosis not present

## 2016-06-05 ENCOUNTER — Ambulatory Visit: Payer: Medicare Other | Admitting: Physical Therapy

## 2016-06-10 ENCOUNTER — Ambulatory Visit: Payer: Medicare Other | Admitting: Physical Therapy

## 2016-06-11 DIAGNOSIS — M4802 Spinal stenosis, cervical region: Secondary | ICD-10-CM | POA: Diagnosis not present

## 2016-06-11 DIAGNOSIS — M5030 Other cervical disc degeneration, unspecified cervical region: Secondary | ICD-10-CM | POA: Diagnosis not present

## 2016-06-11 DIAGNOSIS — M5412 Radiculopathy, cervical region: Secondary | ICD-10-CM | POA: Diagnosis not present

## 2016-06-17 ENCOUNTER — Encounter: Payer: Medicare Other | Admitting: Physical Therapy

## 2016-06-17 ENCOUNTER — Ambulatory Visit: Payer: Medicare Other | Attending: Internal Medicine

## 2016-06-17 DIAGNOSIS — Z9181 History of falling: Secondary | ICD-10-CM | POA: Diagnosis not present

## 2016-06-17 DIAGNOSIS — M6281 Muscle weakness (generalized): Secondary | ICD-10-CM | POA: Diagnosis not present

## 2016-06-17 DIAGNOSIS — R2681 Unsteadiness on feet: Secondary | ICD-10-CM

## 2016-06-17 NOTE — Therapy (Signed)
[facility] [address], [place], [address] Phone: [phone]   Fax:  [phone]  Physical Therapy Evaluation  Patient Details  Name: Rebecca Wolf MRN: [id number] Date of Birth: December 31, 1924 Referring Provider: Oneita Kras  Encounter Date: 06/17/2016      PT End of Session - 06/17/16 1421    Visit Number 1   Number of Visits 17   Date for PT Re-Evaluation Aug 30, 2016   Authorization Type g codes 1/10   PT Start Time 1300   PT Stop Time 1400   PT Time Calculation (min) 60 min   Equipment Utilized During Treatment Gait belt   Activity Tolerance Patient tolerated treatment well   Behavior During Therapy [facility] for tasks assessed/performed      Past Medical History:  Diagnosis Date  . Asthma   . Breast cancer ([place])    2003 and 2004, radiation and chemo for 2004 breast cancer  . 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  . GERD (gastroesophageal reflux disease)   . Hemorrhoids   . Hypertension   . Polyp of colon 2002   bleeding polyp of right ascending colon  . PUD (peptic ulcer disease)   . Seizures ([place])    epilepsy well controlled    Past Surgical History:  Procedure Laterality Date  . BREAST EXCISIONAL BIOPSY Left 2003   positive  . BREAST EXCISIONAL BIOPSY Left 2004   positive  . BREAST LUMPECTOMY Left 2004/2005   cancer  . HEMICOLECTOMY Right    bowel obstruction  . HEMORRHOID SURGERY    . THORACOTOMY/LOBECTOMY  1999   carcinoid tumor  . TOTAL VAGINAL HYSTERECTOMY    . VARICOSE VEIN SURGERY      There were no vitals filed for this visit.       Subjective Assessment - 06/17/16 1302    Subjective "I have a hard time getting up from chairs and my balance is bad."   Patient is accompained by: Family member  Daughter   Pertinent History Pt reports that she has difficulty performing sit to stand from chairs and trouble with her balance. She also reports that her knees have  been "locking" up on her over the last couple weeks. She has been using a rollator for approximately 10 years to help with her balance. She has had approximatlely 3 falls over the last 12 months. All of the falls have occurred when she was not using her walker.    Limitations Walking   Patient Stated Goals Improve balance and strength with transfers   Currently in Pain? Yes  Not related to current episode of care   Pain Score 8    Pain Location Knee   Pain Orientation Right;Left   Pain Descriptors / Indicators Sore   Pain Type Chronic pain   Pain Onset More than a month ago   Pain Frequency Constant            [facility] PT Assessment - 06/17/16 1308      Assessment   Medical Diagnosis Imbalance   Referring Provider Oneita Kras   Onset Date/Surgical Date 06/17/14  Approximate   Hand Dominance Right   Next MD Visit 06/21/16 with Dr. Clayborn Bigness   Prior Therapy Yes, in [place] approximately 1.5 years ago per family     Precautions   Precautions Fall     Restrictions   Weight Bearing Restrictions No     Balance Screen   Has the patient  fallen in the past 6 months Yes   How many times? 3  over the last 12 months   Has the patient had a decrease in activity level because of a fear of falling?  Yes   Is the patient reluctant to leave their home because of a fear of falling?  Yes     [place] Private residence   Living Arrangements Children   Available Help at Discharge Family   Type of [place] to enter   Entrance Stairs-Number of Steps [place] One level   [place] - 4 wheels;Bedside commode;Other (comment)  tub shower   Additional Comments Bathes at sink     Prior Function   Level of Independence Independent with basic ADLs   Vocation Retired   Leisure Likes to watch Netflix     Cognition   Overall Cognitive Status Within Functional Limits for tasks assessed      Observation/Other Assessments   Other Surveys  Other Surveys   Activities of Balance Confidence Scale (ABC Scale)  43.75%   Lower Extremity Functional Scale  15/80     Sensation   Additional Comments Pt reports history of neuropathy in bilateral feet and hands     Posture/Postural Control   Posture Comments Slumped posture with rounded shoulders and forward head     ROM / Strength   AROM / PROM / Strength Strength     Strength   Overall Strength Comments General deconditioning with MMT of bilateral UE/LE of grossly 4- to 4/5 for hip flexion, knee flex/ext, ankle DF, shoulder flexion, elbow flex/ext and grip strength. Pt reports pain with knee extension strength testing. Functional weakness noted with inability to perform sit to stand from chair without heavy UE support and considerable imalance     Ambulation/Gait   Gait Comments Pt ambulates with rollator and wide BOS. Decreased gait speed     Standardized Balance Assessment   Standardized Balance Assessment Five Times Sit to Stand;Timed Up and Go Test;10 meter walk test;Berg Balance Test   Five times sit to stand comments  40 seconds  with hands. Unable to perform sit to stand without UE suppor   10 Meter Walk 14.77 seconds=0.68 m/s  with rollator     Berg Balance Test   Sit to Stand Needs minimal aid to stand or to stabilize   Standing Unsupported Able to stand 30 seconds unsupported   Sitting with Back Unsupported but Feet Supported on Floor or Stool Able to sit safely and securely 2 minutes   Stand to Sit Uses backs of legs against chair to control descent   Transfers Needs one person to assist   Standing Unsupported with Eyes Closed Able to stand 3 seconds   Standing Ubsupported with Feet Together Needs help to attain position but able to stand for 30 seconds with feet together   From Standing, Reach Forward with Outstretched Arm Reaches forward but needs supervision   From Standing Position, Pick up Object from Floor Able  to pick up shoe, needs supervision   From Standing Position, Turn to Look Behind Over each Shoulder Needs assist to keep from losing balance and falling   Turn 360 Degrees Needs assistance while turning   Standing Unsupported, Alternately Place Feet on Step/Stool Needs assistance to keep from falling or unable to try   Standing Unsupported, One Foot in [employer] balance while stepping  or standing   Standing on One Leg Unable to try or needs assist to prevent fall   Total Score 17     Timed Up and Go Test   TUG Normal TUG   Normal TUG (seconds) 22.7       THER-EX Pt instructed in HEP and perform glut sets, hip flexion marches, seated adduction pillow squeezes, and seated hip abduction/clams with therapist. Yellow and red therabands provided. Pt instructed to perform each exercise 3 x 10, three times a day. Encouraged to taper reps/sets according to tolerance and fatigue. Prefer increased frequency throughout the day over intensity with less frequency.                      PT Education - 06/17/16 1420    Education provided Yes   Education Details HEP and plan of care   Person(s) Educated Patient   Methods Explanation;Demonstration;Verbal cues;Handout   Comprehension Verbalized understanding;Returned demonstration;Verbal cues required             PT Long Term Goals - 06/17/16 1429      PT LONG TERM GOAL #1   Title Pt will be independent with HEP in order to improve strength and balance in order to decrease fall risk and improve function at home and work   Time 8   Period Weeks   Status New     PT LONG TERM GOAL #2   Title Pt will improve ABC by at least 13% in order to demonstrate clinically significant improvement in balance confidence.    Baseline 06/17/16: 43.75%   Time 8   Period Weeks   Status New     PT LONG TERM GOAL #3   Title Pt will improve BERG by at least 3 points in order to demonstrate clinically significant improvement in balance.      Baseline 06/17/16: 17/56   Time 8   Status New     PT LONG TERM GOAL #4   Title Pt will decrease TUG by at least seconds in order to demonstrate decreased fall risk    Baseline 06/17/16: 22.7 seconds   Time 8   Period Weeks     PT LONG TERM GOAL #5   Title Pt will decrease 5TSTS by at least 3 seconds in order to demonstrate clinically significant improvement in LE strength   Baseline 06/17/16: 40 seconds   Time 8   Period Weeks   Status New               Plan - 06/17/16 1421    Clinical Impression Statement Pt is a pleasant 82 yo female referred for imbalance. PT evaluation reveals decreased LE strength and impaired balance. Pt unable to perform sit to stand without heavy UE assist. Imbalance and high fall risk indicated by BERG: 17/56, TUG: 22.7 seconds, Five Time Sit to Stand: 40 seconds, and 23m gait speed of 0.68 m/s. Pt with low balance confidence as indicated by 43.75% on ABC Scale. Decreased LE function noted by LEFS of 15/80. Pt will benefit from skilled PT services to address deficits in LE strength and balance in order to improve function at decrease risk for future falls.    Rehab Potential Fair   Clinical Impairments Affecting Rehab Potential Positive: motivation; Negative: age, chronicity, bilateral knee pain   PT Frequency 2x / week   PT Duration 8 weeks   PT Treatment/Interventions ADLs/Self Care Home Management;Aquatic Therapy;Canalith Repostioning;Cryotherapy;Electrical Stimulation;Iontophoresis 4mg /ml Dexamethasone;Moist Heat;Traction;Ultrasound;DME Instruction;Gait training;Stair training;Functional mobility  training;Therapeutic activities;Therapeutic exercise;Balance training;Neuromuscular re-education;Patient/family education;Wheelchair mobility training;Manual techniques;Passive range of motion;Vestibular   PT Next Visit Plan Progress balance and strengthening. Plan for transition to main clinic in order to utilize parallel bars   PT Home Exercise Plan  Seated/supine glut sets, seated adduction squeezes, seated resisted abduction and/or clams, seated/supine hip flexion marching   Consulted and Agree with Plan of Care Patient;Family member/caregiver   Family Member Consulted Daughter      Patient will benefit from skilled therapeutic intervention in order to improve the following deficits and impairments:  Abnormal gait, Decreased balance, Decreased strength, Difficulty walking, Obesity  Visit Diagnosis: Unsteadiness on feet - Plan: PT plan of care cert/re-cert  Muscle weakness (generalized) - Plan: PT plan of care cert/re-cert  History of falling - Plan: PT plan of care cert/re-cert      G-Codes - [id number]    Functional Assessment Tool Used clinical judgement, 2m gait speed, 5TSTS, BERG, TUG, ABC, LEFS   Functional Limitation Mobility: Walking and moving around   Mobility: Walking and Moving Around Current Status [id number]) At least 60 percent but less than 80 percent impaired, limited or restricted   Mobility: Walking and Moving Around Goal Status [phone]) At least 40 percent but less than 60 percent impaired, limited or restricted       Problem List Patient Active Problem List   Diagnosis Date Noted  . Hemorrhoids 05/17/2016  . Obesity (BMI 30-39.9) 04/26/2016  . Multiple lung nodules 04/19/2016  . Chest pain 04/17/2016  . Partial symptomatic epilepsy with complex partial seizures, not intractable, without status epilepticus ([place]) 06/30/2015  . Bronchiectasis without complication ([place]) [id number]  . Imbalance 11/08/2014  . Essential (primary) hypertension 11/08/2014  . Personal history of malignant neoplasm of breast 11/08/2014  . History of malignant carcinoid tumor of bronchus and lung 11/08/2014  . H/O peptic ulcer 11/08/2014  . Asthma, mild intermittent 11/08/2014  . PE (pulmonary embolism) 11/08/2014  . Gonalgia 11/08/2014  . Leg varices 11/08/2014   Phillips Grout PT, DPT   Huprich,Jason 06/17/2016, 4:54  PM  [facility] [address], [place], [address] Phone: [phone]   Fax:  [phone]  Name: TORRY DONA MRN: [id number] Date of Birth: 07/10/1924

## 2016-06-17 NOTE — Patient Instructions (Signed)
Gluteal Sets    Squeeze pelvic floor and hold. Tighten bottom. Hold for _3__ seconds. Relax for _3__seconds. Repeat _10__ times. Relax 1 minute and then perform 2 more sets Do _3__ times a day.  Hip (Front)    Begin sitting tall, both feet flat on floor. Inhale, then exhale while lifting knee as high as is comfortable, keeping upper body straight and still. Slowly return to starting position. Repeat __10__ times each leg. Do __3__ sets per session. Do _3___ sessions per day.  Adduction: Hip - Knees Together (Sitting)    Sit with towel roll between knees. Push knees together. Hold for _3__ seconds. Rest for _3__ seconds. Repeat _10__ times. Relax and then repeat 2 more times (3 sets/session). Do _3__ times a day.  ABDUCTION: Sitting - Resistance Band (Active)    Sit with feet flat. Lift right leg slightly and, against resistance band, draw it out to side. Bring it back to the middle and repeat with the L side. Perform 3 sets of 10 repetitions. Perform _3__ sessions per day.

## 2016-06-20 ENCOUNTER — Ambulatory Visit: Payer: Medicare Other | Admitting: Physical Therapy

## 2016-06-20 ENCOUNTER — Other Ambulatory Visit: Payer: Self-pay | Admitting: Internal Medicine

## 2016-06-20 DIAGNOSIS — R197 Diarrhea, unspecified: Secondary | ICD-10-CM

## 2016-06-20 DIAGNOSIS — M6281 Muscle weakness (generalized): Secondary | ICD-10-CM

## 2016-06-20 DIAGNOSIS — R2681 Unsteadiness on feet: Secondary | ICD-10-CM | POA: Diagnosis not present

## 2016-06-20 DIAGNOSIS — Z9181 History of falling: Secondary | ICD-10-CM | POA: Diagnosis not present

## 2016-06-20 NOTE — Therapy (Signed)
[facility] [address] [place], [address] Phone: [phone]   Fax:  [phone]  Physical Therapy Treatment  Patient Details  Name: Rebecca Wolf MRN: [id number] Date of Birth: February 13, 1925 Referring Provider: Oneita Kras  Encounter Date: 06/20/2016      PT End of Session - 06/20/16 1356    Visit Number 2   Number of Visits 17   Date for PT Re-Evaluation 09/10/16   Authorization Type g codes 1/10   PT Start Time [id number]   PT Stop Time 1421   PT Time Calculation (min) 27 min   Equipment Utilized During Treatment Gait belt   Activity Tolerance Patient tolerated treatment well   Behavior During Therapy [facility] for tasks assessed/performed      Past Medical History:  Diagnosis Date  . Asthma   . Breast cancer ([place])    2003 and 2004, radiation and chemo for 2004 breast cancer  . 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  . GERD (gastroesophageal reflux disease)   . Hemorrhoids   . Hypertension   . Polyp of colon 2002   bleeding polyp of right ascending colon  . PUD (peptic ulcer disease)   . Seizures ([place])    epilepsy well controlled    Past Surgical History:  Procedure Laterality Date  . BREAST EXCISIONAL BIOPSY Left 2003   positive  . BREAST EXCISIONAL BIOPSY Left 2004   positive  . BREAST LUMPECTOMY Left 2004/2005   cancer  . HEMICOLECTOMY Right    bowel obstruction  . HEMORRHOID SURGERY    . THORACOTOMY/LOBECTOMY  1999   carcinoid tumor  . TOTAL VAGINAL HYSTERECTOMY    . VARICOSE VEIN SURGERY      There were no vitals filed for this visit.      Subjective Assessment - 06/20/16 1356    Subjective Pt reports no falls since previous session.   Patient is accompained by: Family member  Daughter   Pertinent History Pt reports that she has difficulty performing sit to stand from chairs and trouble with her balance. She also reports that her knees have been "locking" up on her  over the last couple weeks. She has been using a rollator for approximately 10 years to help with her balance. She has had approximatlely 3 falls over the last 12 months. All of the falls have occurred when she was not using her walker.    Limitations Walking   Patient Stated Goals Improve balance and strength with transfers   Currently in Pain? Yes   Pain Score 5    Pain Location Knee   Pain Orientation Right;Left   Pain Onset More than a month ago              Objective: Seated marching 3x10 each side, noted SOB with this so began monitoring O2. With this exercise O2 improved from 92 to 97%. Pt reported fatigue.  Following rest break performed 3x10 seated abduction with RTB, with cuing for 3 sec. Holds.   Ball squeezes for adductor activation 3x10 with 3 sec. Holds.  Following this exercise pt requested short rest break.  Amb in clinic 5x20'. Pt reported mild dizziness following this. Discontinued PT at this time as dizziness did not abate. O2 maintained at appropriate level throughout session.                        PT Long Term Goals - 06/17/16 1429  PT LONG TERM GOAL #1   Title Pt will be independent with HEP in order to improve strength and balance in order to decrease fall risk and improve function at home and work   Time 8   Period Weeks   Status New     PT LONG TERM GOAL #2   Title Pt will improve ABC by at least 13% in order to demonstrate clinically significant improvement in balance confidence.    Baseline 06/17/16: 43.75%   Time 8   Period Weeks   Status New     PT LONG TERM GOAL #3   Title Pt will improve BERG by at least 3 points in order to demonstrate clinically significant improvement in balance.     Baseline 06/17/16: 17/56   Time 8   Status New     PT LONG TERM GOAL #4   Title Pt will decrease TUG by at least seconds in order to demonstrate decreased fall risk    Baseline 06/17/16: 22.7 seconds   Time 8   Period Weeks      PT LONG TERM GOAL #5   Title Pt will decrease 5TSTS by at least 3 seconds in order to demonstrate clinically significant improvement in LE strength   Baseline 06/17/16: 40 seconds   Time 8   Period Weeks   Status New               Plan - 06/20/16 1421    Clinical Impression Statement Pt had difficulty tolerating routine with notable SOB. Monitored O2 throughout session which initially was at 92, with all activities other than gait maintained at appropriate 93-97, with gait dropped to 90 but no lower. pt demonstrated extremely poor endurance/conditioning.   Rehab Potential Fair   Clinical Impairments Affecting Rehab Potential Positive: motivation; Negative: age, chronicity, bilateral knee pain   PT Frequency 2x / week   PT Duration 8 weeks   PT Treatment/Interventions ADLs/Self Care Home Management;Aquatic Therapy;Canalith Repostioning;Cryotherapy;Electrical Stimulation;Iontophoresis 4mg /ml Dexamethasone;Moist Heat;Traction;Ultrasound;DME Instruction;Gait training;Stair training;Functional mobility training;Therapeutic activities;Therapeutic exercise;Balance training;Neuromuscular re-education;Patient/family education;Wheelchair mobility training;Manual techniques;Passive range of motion;Vestibular   PT Next Visit Plan Progress balance and strengthening. Plan for transition to main clinic in order to utilize parallel bars   PT Home Exercise Plan Seated/supine glut sets, seated adduction squeezes, seated resisted abduction and/or clams, seated/supine hip flexion marching   Consulted and Agree with Plan of Care Patient;Family member/caregiver   Family Member Consulted Daughter      Patient will benefit from skilled therapeutic intervention in order to improve the following deficits and impairments:  Abnormal gait, Decreased balance, Decreased strength, Difficulty walking, Obesity  Visit Diagnosis: Unsteadiness on feet  Muscle weakness (generalized)     Problem List Patient  Active Problem List   Diagnosis Date Noted  . Hemorrhoids 05/17/2016  . Obesity (BMI 30-39.9) 04/26/2016  . Multiple lung nodules 04/19/2016  . Chest pain 04/17/2016  . Partial symptomatic epilepsy with complex partial seizures, not intractable, without status epilepticus ([place]) 06/30/2015  . Bronchiectasis without complication ([place]) [id number]  . Imbalance 11/08/2014  . Essential (primary) hypertension 11/08/2014  . Personal history of malignant neoplasm of breast 11/08/2014  . History of malignant carcinoid tumor of bronchus and lung 11/08/2014  . H/O peptic ulcer 11/08/2014  . Asthma, mild intermittent 11/08/2014  . PE (pulmonary embolism) 11/08/2014  . Gonalgia 11/08/2014  . Leg varices 11/08/2014    Byren Pankow PT DPT 06/20/2016, 4:06 PM  [facility] [address] [place], [address] Phone: [phone]   Fax:  [phone]  Name: TIYANNA ALERS MRN: [id number] Date of Birth: 1924/07/21

## 2016-06-21 DIAGNOSIS — I208 Other forms of angina pectoris: Secondary | ICD-10-CM | POA: Diagnosis not present

## 2016-06-21 DIAGNOSIS — K219 Gastro-esophageal reflux disease without esophagitis: Secondary | ICD-10-CM | POA: Diagnosis not present

## 2016-06-21 DIAGNOSIS — Z86718 Personal history of other venous thrombosis and embolism: Secondary | ICD-10-CM | POA: Diagnosis not present

## 2016-06-21 DIAGNOSIS — R0602 Shortness of breath: Secondary | ICD-10-CM | POA: Diagnosis not present

## 2016-06-21 DIAGNOSIS — R9431 Abnormal electrocardiogram [ECG] [EKG]: Secondary | ICD-10-CM | POA: Diagnosis not present

## 2016-06-21 DIAGNOSIS — R601 Generalized edema: Secondary | ICD-10-CM | POA: Diagnosis not present

## 2016-06-21 DIAGNOSIS — R531 Weakness: Secondary | ICD-10-CM | POA: Diagnosis not present

## 2016-06-21 DIAGNOSIS — R011 Cardiac murmur, unspecified: Secondary | ICD-10-CM | POA: Diagnosis not present

## 2016-06-21 DIAGNOSIS — G40309 Generalized idiopathic epilepsy and epileptic syndromes, not intractable, without status epilepticus: Secondary | ICD-10-CM | POA: Diagnosis not present

## 2016-06-21 DIAGNOSIS — E669 Obesity, unspecified: Secondary | ICD-10-CM | POA: Diagnosis not present

## 2016-06-25 ENCOUNTER — Other Ambulatory Visit: Payer: Self-pay | Admitting: Internal Medicine

## 2016-06-25 ENCOUNTER — Emergency Department: Payer: Medicare Other

## 2016-06-25 ENCOUNTER — Encounter: Payer: Self-pay | Admitting: Emergency Medicine

## 2016-06-25 ENCOUNTER — Emergency Department
Admission: EM | Admit: 2016-06-25 | Discharge: 2016-06-25 | Disposition: A | Discharge status: Home / Self Care | Payer: Medicare Other | Attending: Student in an Organized Health Care Education/Training Program | Admitting: Student in an Organized Health Care Education/Training Program

## 2016-06-25 DIAGNOSIS — Z87891 Personal history of nicotine dependence: Secondary | ICD-10-CM | POA: Diagnosis not present

## 2016-06-25 DIAGNOSIS — J09X2 Influenza due to identified novel influenza A virus with other respiratory manifestations: Secondary | ICD-10-CM | POA: Diagnosis not present

## 2016-06-25 DIAGNOSIS — R531 Weakness: Secondary | ICD-10-CM | POA: Diagnosis not present

## 2016-06-25 DIAGNOSIS — Z79899 Other long term (current) drug therapy: Secondary | ICD-10-CM | POA: Diagnosis not present

## 2016-06-25 DIAGNOSIS — J45909 Unspecified asthma, uncomplicated: Secondary | ICD-10-CM | POA: Insufficient documentation

## 2016-06-25 DIAGNOSIS — Z853 Personal history of malignant neoplasm of breast: Secondary | ICD-10-CM | POA: Insufficient documentation

## 2016-06-25 DIAGNOSIS — R6889 Other general symptoms and signs: Secondary | ICD-10-CM | POA: Diagnosis not present

## 2016-06-25 DIAGNOSIS — J101 Influenza due to other identified influenza virus with other respiratory manifestations: Secondary | ICD-10-CM

## 2016-06-25 DIAGNOSIS — R05 Cough: Secondary | ICD-10-CM | POA: Diagnosis not present

## 2016-06-25 DIAGNOSIS — I10 Essential (primary) hypertension: Secondary | ICD-10-CM | POA: Insufficient documentation

## 2016-06-25 LAB — INFLUENZA PANEL BY PCR (TYPE A & B)
Influenza A By PCR: POSITIVE — AB
Influenza B By PCR: NEGATIVE

## 2016-06-25 MED ORDER — HYDROCOD POLST-CPM POLST ER 10-8 MG/5ML PO SUER: 5.0000 mL | Freq: Every evening | ORAL | 0 refills | Status: DC | PRN

## 2016-06-25 MED ORDER — BENZONATATE 100 MG PO CAPS: 100.0000 mg | ORAL_CAPSULE | Freq: Three times a day (TID) | ORAL | 0 refills | Status: DC | PRN

## 2016-06-25 MED ORDER — OSELTAMIVIR PHOSPHATE 75 MG PO CAPS: 75.0000 mg | ORAL_CAPSULE | Freq: Two times a day (BID) | ORAL | 0 refills | Status: AC

## 2016-06-25 NOTE — ED Notes (Signed)
Pt has been coughing overnight, family members have had the flu per daugther, pt denies fever

## 2016-06-25 NOTE — ED Provider Notes (Signed)
[facility] Emergency Department Provider Note   ____________________________________________   First MD Initiated Contact with Patient 06/25/16 1417     (approximate)  I have reviewed the triage vital signs and the nursing notes.   HISTORY  Chief Complaint Cough    HPI Rebecca Wolf is a 82 y.o. female patient complaining of cough, fever, and chills for 2 days. Daughter stated multiple. Were in the household have tested positive for fluid in the past week. Patient denies nausea vomiting or diarrhea. Patient has taken a flu shot this season. Patient denies pain. No palliative measures for her complaint.   Past Medical History:  Diagnosis Date  . Asthma   . Breast cancer ([place])    2003 and 2004, radiation and chemo for 2004 breast cancer  . 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  . GERD (gastroesophageal reflux disease)   . Hemorrhoids   . Hypertension   . Polyp of colon 2002   bleeding polyp of right ascending colon  . PUD (peptic ulcer disease)   . Seizures ([place])    epilepsy well controlled    Patient Active Problem List   Diagnosis Date Noted  . Hemorrhoids 05/17/2016  . Obesity (BMI 30-39.9) 04/26/2016  . Multiple lung nodules 04/19/2016  . Chest pain 04/17/2016  . Partial symptomatic epilepsy with complex partial seizures, not intractable, without status epilepticus ([place]) 06/30/2015  . Bronchiectasis without complication ([place]) [id number]  . Imbalance 11/08/2014  . Essential (primary) hypertension 11/08/2014  . Personal history of malignant neoplasm of breast 11/08/2014  . History of malignant carcinoid tumor of bronchus and lung 11/08/2014  . H/O peptic ulcer 11/08/2014  . Asthma, mild intermittent 11/08/2014  . PE (pulmonary embolism) 11/08/2014  . Gonalgia 11/08/2014  . Leg varices 11/08/2014    Past Surgical History:  Procedure Laterality Date  . BREAST EXCISIONAL BIOPSY Left 2003   positive  . BREAST EXCISIONAL  BIOPSY Left 2004   positive  . BREAST LUMPECTOMY Left 2004/2005   cancer  . HEMICOLECTOMY Right    bowel obstruction  . HEMORRHOID SURGERY    . THORACOTOMY/LOBECTOMY  1999   carcinoid tumor  . TOTAL VAGINAL HYSTERECTOMY    . VARICOSE VEIN SURGERY      Prior to Admission medications   Medication Sig Start Date End Date Taking? Authorizing Provider  acetaminophen (TYLENOL) 500 MG tablet Take 500 mg by mouth every 6 (six) hours as needed.    Historical Provider, MD  albuterol (ACCUNEB) 1.25 MG/3ML nebulizer solution Inhale 1 ampule into the lungs 4 (four) times daily as needed. 05/06/14   Historical Provider, MD  albuterol (PROVENTIL HFA;VENTOLIN HFA) 108 (90 BASE) MCG/ACT inhaler Inhale 1 puff into the lungs 4 (four) times daily.    Historical Provider, MD  amLODipine (NORVASC) 5 MG tablet TAKE 1 TABLET BY MOUTH EVERY DAY 07/24/15   Glean Hess, MD  benzonatate (TESSALON PERLES) 100 MG capsule Take 1 capsule (100 mg total) by mouth 3 (three) times daily as needed for cough. 06/25/16   Sable Feil, PA-C  chlorpheniramine-HYDROcodone [facility] ER) 10-8 MG/5ML SUER Take 5 mLs by mouth at bedtime as needed for cough. 06/25/16   Sable Feil, PA-C  ELIQUIS 2.5 MG TABS tablet TAKE 1 TABLET BY MOUTH TWICE DAILY 09/25/15   Glean Hess, MD  fluticasone [facility]) 50 MCG/ACT nasal spray Place 2 sprays into the nose daily.    Historical Provider, MD  Fluticasone-Salmeterol (ADVAIR) 500-50 MCG/DOSE AEPB  Inhale 1 puff into the lungs 2 (two) times daily.    Historical Provider, MD  hydrocortisone cream 1 % Apply 1 application topically 2 (two) times daily. 05/17/16   Glean Hess, MD  Ibuprofen 200 MG CAPS Take by mouth.    Historical Provider, MD  levETIRAcetam (KEPPRA) 750 MG tablet Take 1 tablet by mouth 2 (two) times daily. 06/29/13   Historical Provider, MD  montelukast (SINGULAIR) 10 MG tablet Take 1 tablet by mouth daily.    Historical Provider, MD  NONFORMULARY OR  COMPOUNDED ITEM Peripheral Neuropathy [facility] 2 refills    Historical Provider, MD  nystatin (MYCOSTATIN) powder Apply topically 3 (three) times daily. 09/25/15   Lequita Asal, MD  oseltamivir (TAMIFLU) 75 MG capsule Take 1 capsule (75 mg total) by mouth 2 (two) times daily. 06/25/16 06/30/16  Sable Feil, PA-C  pantoprazole (PROTONIX) 40 MG tablet TAKE 1 TABLET BY MOUTH TWICE DAILY 11/18/15   Glean Hess, MD  spironolactone-hydrochlorothiazide (ALDACTAZIDE) 25-25 MG tablet Take 1 tablet by mouth daily. 06/30/15   Glean Hess, MD    Allergies Levofloxacin and Sulfa antibiotics  Family History  Problem Relation Age of Onset  . Alcoholism Father   . Diabetes Sister     Social History Social History  Substance Use Topics  . Smoking status: Former Research scientist (life sciences)  . Smokeless tobacco: Never Used  . Alcohol use No    Review of Systems Constitutional: No fever/chills Eyes: No visual changes. ENT: No sore throat. Cardiovascular: Denies chest pain. Respiratory: Denies shortness of breath. Gastrointestinal: No abdominal pain.  No nausea, no vomiting.  No diarrhea.  No constipation. Genitourinary: Negative for dysuria. Musculoskeletal: Negative for back pain. Skin: Negative for rash. Neurological: Negative for headaches, focal weakness or numbness. Endocrine:Hypertension  ____________________________________________   PHYSICAL EXAM:  VITAL SIGNS: ED Triage Vitals  Enc Vitals Group     BP 06/25/16 1336 125/65     Pulse Rate 06/25/16 1336 (!) 112     Resp 06/25/16 1336 17     Temp 06/25/16 1336 100.1 F (37.8 C)     Temp Source 06/25/16 1336 Oral     SpO2 06/25/16 1336 97 %     Weight 06/25/16 1335 185 lb (83.9 kg)     Height 06/25/16 1335 5\' 4"  (1.626 m)     Head Circumference --      Peak Flow --      Pain Score 06/25/16 1335 0     Pain Loc --      Pain Edu? --      Excl. in [place]? --     Constitutional: Alert and oriented. Well appearing and  in no acute distress. Eyes: Conjunctivae are normal. PERRL. EOMI. Head: Atraumatic. Nose: No congestion/rhinnorhea. Mouth/Throat: Mucous membranes are moist.  Oropharynx non-erythematous. Neck: No stridor.  No cervical spine tenderness to palpation. Hematological/Lymphatic/Immunilogical: No cervical lymphadenopathy. Cardiovascular: Normal rate, regular rhythm. Grossly normal heart sounds.  Good peripheral circulation. tachycardic  Respiratory: Normal respiratory effort.  No retractions. Lungs CTAB. Gastrointestinal: Soft and nontender. No distention. No abdominal bruits. No CVA tenderness. Musculoskeletal: No lower extremity tenderness nor edema.  No joint effusions. Neurologic:  Normal speech and language. No gross focal neurologic deficits are appreciated. No gait instability. Skin:  Skin is warm, dry and intact. No rash noted. Psychiatric: Mood and affect are normal. Speech and behavior are normal.  ____________________________________________   LABS (all labs ordered are listed, but only abnormal results are displayed)  Labs  Reviewed  INFLUENZA PANEL BY PCR (TYPE A & B, H1N1) - Abnormal; Notable for the following:       Result Value   Influenza A By PCR POSITIVE (*)    All other components within normal limits   __Positive influenza A __________________________________________  EKG   ____________________________________________  RADIOLOGYno acute findings on chest x-ray_________________________________________   PROCEDURES  Procedure(s) performed: None  Procedures  Critical Care performed: No  ____________________________________________   INITIAL IMPRESSION / ASSESSMENT AND PLAN / ED COURSE  Pertinent labs & imaging results that were available during my care of the patient were reviewed by me and considered in my medical decision making (see chart for details). Influenza. Patient given discharge Instructions. Patient given a prescription for Tamiflu, Tussionex  to take only at night, and Tessalon Perles. Advised Tylenol for fever. Clinical Course    discussed positive influenza A results with patient and family member. Discussed negative chest x-ray with patient and family members.    ____________________________________________   FINAL CLINICAL IMPRESSION(S) / ED DIAGNOSES  Final diagnoses:  Influenza A      NEW MEDICATIONS STARTED DURING THIS VISIT:  New Prescriptions   BENZONATATE (TESSALON PERLES) 100 MG CAPSULE    Take 1 capsule (100 mg total) by mouth 3 (three) times daily as needed for cough.   CHLORPHENIRAMINE-HYDROCODONE (TUSSIONEX PENNKINETIC ER) 10-8 MG/5ML SUER    Take 5 mLs by mouth at bedtime as needed for cough.   OSELTAMIVIR (TAMIFLU) 75 MG CAPSULE    Take 1 capsule (75 mg total) by mouth 2 (two) times daily.     Note:  This document was prepared using Dragon voice recognition software and may include unintentional dictation errors.    Sable Feil, PA-C 06/25/16 1517    Lisa Roca, MD 06/25/16 [phone]

## 2016-06-25 NOTE — ED Triage Notes (Signed)
Pt here with cough and chills, reports multiple people in household have tested positive for the flu.

## 2016-06-27 ENCOUNTER — Encounter: Payer: Medicare Other | Admitting: Physical Therapy

## 2016-06-28 ENCOUNTER — Encounter: Payer: Medicare Other | Admitting: Physical Therapy

## 2016-07-02 ENCOUNTER — Other Ambulatory Visit: Payer: Self-pay | Admitting: Internal Medicine

## 2016-07-03 ENCOUNTER — Encounter: Payer: Medicare Other | Admitting: Physical Therapy

## 2016-07-03 ENCOUNTER — Ambulatory Visit (INDEPENDENT_AMBULATORY_CARE_PROVIDER_SITE_OTHER): Payer: Medicare Other | Admitting: Gastroenterology

## 2016-07-03 ENCOUNTER — Other Ambulatory Visit: Payer: Self-pay | Admitting: *Deleted

## 2016-07-03 ENCOUNTER — Ambulatory Visit: Payer: Medicare Other

## 2016-07-03 ENCOUNTER — Encounter: Payer: Self-pay | Admitting: Gastroenterology

## 2016-07-03 VITALS — BP 134/83 | HR 85 | Temp 97.5°F | Resp 16 | Ht 63.0 in | Wt 178.6 lb

## 2016-07-03 DIAGNOSIS — R109 Unspecified abdominal pain: Secondary | ICD-10-CM

## 2016-07-03 DIAGNOSIS — R197 Diarrhea, unspecified: Secondary | ICD-10-CM | POA: Diagnosis not present

## 2016-07-03 MED ORDER — SUCRALFATE 1 G PO TABS: 1.0000 g | ORAL_TABLET | Freq: Three times a day (TID) | ORAL | 1 refills | Status: DC

## 2016-07-03 NOTE — Progress Notes (Signed)
Gastroenterology Consultation  Referring Provider:     Glean Hess, MD Primary Care Physician:  Halina Maidens, MD Primary Gastroenterologist:  Dr. Jonathon Bellows  Reason for Consultation:     Diarrhea         HPI:   Rebecca Wolf is a 83 y.o. y/o female referred for consultation & management  by Dr. Halina Maidens, MD.   She has been referred for diarrhea. She was recently seen at the ER on 06/25/16 for cough, fever,chills of 2 days. She tested positive for influenza A, given a script for tamiflu. She was seen by Dr Candace Cruise previously in 2016 for hemorroids and anal pressure.  She has had a carcinoid tumor excised from her lung in 1998 .   Diarrhea :  Onset: few months    Number of bowel movements a day : every 3 hours, not progressed , every time she eats    Color : brown  Consistency:  Mashed potato Present status: ongoing    Shape of stool:  No shape    Weight loss:  Stable  Prior colonoscopy:  Many years back  In [place]- recalls was normal but in year 2000 had a polyp   Artificial sugars/sodas/chewing gum:  Rarely splenda,    Bloating:  No   Gas:  Some gas Antibiotic use:  1 month used z pack .   Abdominal pain: Onset: since last week on and off  Site :middle of her abdomen ,  Radiation: localized, on and off , occurs 2 -3 times , each episode lasts 10 minutes  Nature of pain: unable to describe  Aggravating factors: nothing  Relieving factors :better with ginger ale  Weight loss: no  NSAID use: ibuprofen - 2-3 weeks back - 1 dose  PPI use : protonix- daily twice  Gall bladder surgery: intact    Past Medical History:  Diagnosis Date  . Asthma   . Breast cancer ([place])    2003 and 2004, radiation and chemo for 2004 breast cancer  . 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  . GERD (gastroesophageal reflux disease)   . Hemorrhoids   . Hypertension   . Polyp of colon 2002   bleeding polyp of right ascending colon  . PUD (peptic ulcer disease)   .  Seizures ([place])    epilepsy well controlled    Past Surgical History:  Procedure Laterality Date  . BREAST EXCISIONAL BIOPSY Left 2003   positive  . BREAST EXCISIONAL BIOPSY Left 2004   positive  . BREAST LUMPECTOMY Left 2004/2005   cancer  . HEMICOLECTOMY Right    bowel obstruction  . HEMORRHOID SURGERY    . THORACOTOMY/LOBECTOMY  1999   carcinoid tumor  . TOTAL VAGINAL HYSTERECTOMY    . VARICOSE VEIN SURGERY      Prior to Admission medications   Medication Sig Start Date End Date Taking? Authorizing Provider  acetaminophen (TYLENOL) 500 MG tablet Take 500 mg by mouth every 6 (six) hours as needed.   Yes Historical Provider, MD  albuterol (ACCUNEB) 1.25 MG/3ML nebulizer solution Inhale 1 ampule into the lungs 4 (four) times daily as needed. 05/06/14  Yes Historical Provider, MD  albuterol (PROVENTIL HFA;VENTOLIN HFA) 108 (90 BASE) MCG/ACT inhaler Inhale 1 puff into the lungs 4 (four) times daily.   Yes Historical Provider, MD  albuterol (PROVENTIL) (2.5 MG/3ML) 0.083% nebulizer solution USE 1 VIAL VIA NEBULIZER EVERY 6 HOURS AS NEEDED FOR WHEEZING 06/26/16  Yes Glean Hess,  MD  amLODipine (NORVASC) 5 MG tablet TAKE 1 TABLET BY MOUTH EVERY DAY 07/24/15  Yes Glean Hess, MD  ELIQUIS 2.5 MG TABS tablet TAKE 1 TABLET BY MOUTH TWICE DAILY 09/25/15  Yes Glean Hess, MD  fluticasone [facility]) 50 MCG/ACT nasal spray Place 2 sprays into the nose daily.   Yes Historical Provider, MD  Fluticasone-Salmeterol (ADVAIR) 500-50 MCG/DOSE AEPB Inhale 1 puff into the lungs 2 (two) times daily.   Yes Historical Provider, MD  hydrocortisone cream 1 % Apply 1 application topically 2 (two) times daily. 05/17/16  Yes Glean Hess, MD  Ibuprofen 200 MG CAPS Take by mouth.   Yes Historical Provider, MD  levETIRAcetam (KEPPRA) 750 MG tablet Take 1 tablet by mouth 2 (two) times daily. 06/29/13  Yes Historical Provider, MD  montelukast (SINGULAIR) 10 MG tablet Take 1 tablet by mouth daily.   Yes  Historical Provider, MD  NONFORMULARY OR COMPOUNDED ITEM Peripheral Neuropathy [facility] 2 refills   Yes Historical Provider, MD  nystatin (MYCOSTATIN) powder Apply topically 3 (three) times daily. 09/25/15  Yes Lequita Asal, MD  pantoprazole (PROTONIX) 40 MG tablet TAKE 1 TABLET BY MOUTH TWICE DAILY 11/18/15  Yes Glean Hess, MD  spironolactone-hydrochlorothiazide (ALDACTAZIDE) 25-25 MG tablet TAKE 1 TABLET BY MOUTH DAILY 07/02/16  Yes Glean Hess, MD    Family History  Problem Relation Age of Onset  . Alcoholism Father   . Diabetes Sister      Social History  Substance Use Topics  . Smoking status: Former Research scientist (life sciences)  . Smokeless tobacco: Never Used  . Alcohol use No    Allergies as of 07/03/2016 - Review Complete 07/03/2016  Allergen Reaction Noted  . Levofloxacin  11/08/2014  . Sulfa antibiotics  11/08/2014    Review of Systems:    All systems reviewed and negative except where noted in HPI.   Physical Exam:  BP 134/83   Pulse 85   Temp 97.5 F (36.4 C)   Resp 16   Ht 5\' 3"  (1.6 m)   Wt 178 lb 9.6 oz (81 kg)   SpO2 96%   BMI 31.64 kg/m  No LMP recorded. Patient has had a hysterectomy. Psych:  Alert and cooperative. Normal mood and affect. General:   Alert,  Well-developed, well-nourished, pleasant and cooperative in NAD Head:  Normocephalic and atraumatic. Nose:  No deformity, discharge, or lesions. Mouth:  No deformity or lesions,oropharynx pink & moist. Neck:  Supple; no masses or thyromegaly. Lungs:  Respirations even and unlabored.  Clear throughout to auscultation.   No wheezes, crackles, or rhonchi. No acute distress. Heart:  Regular rate and rhythm; no murmurs, clicks, rubs, or gallops. Abdomen:  Normal bowel sounds.  No bruits.  Soft, non-tender and non-distended without masses, hepatosplenomegaly or hernias noted.  No guarding or rebound tenderness.    Psych:  Alert and cooperative. Normal mood and affect.  Imaging Studies: Dg  Chest 2 View  Result Date: 06/25/2016 CLINICAL DATA:  Cough and chills.  History of breast carcinoma EXAM: CHEST  2 VIEW COMPARISON:  April 17, 2016 FINDINGS: Scarring in the medial left upper lobe is stable. Postoperative change in the right perihilar and right paratracheal regions is stable. There is no edema or consolidation. Heart size and pulmonary vascularity are normal. There is atherosclerotic calcification in the aorta. There is no evident adenopathy. No blastic or lytic bone lesions. IMPRESSION: Postoperative change on the right. Scarring left upper lobe. No edema or consolidation. Stable cardiac  silhouette. There is aortic atherosclerosis. Electronically Signed   By: Lowella Grip III M.D.   On: 06/25/2016 15:10    Assessment and Plan:   Rebecca Wolf is a 83 y.o. y/o female has been referred for diarrhea that is long standing . I will rule  Infection with stool tests. Since the abdominal pain began after recent flu like illness, it may represent a post infectious IBS- will wait and see how she does for 2 week .if worsens will need abdominal imaging. I did briefly talk about a colonoscopy which is is very clear that she does not wish to have at this time.   Follow up in 2 weeks.   Dr Jonathon Bellows MD

## 2016-07-05 ENCOUNTER — Other Ambulatory Visit
Admission: RE | Admit: 2016-07-05 | Discharge: 2016-07-05 | Disposition: A | Discharge status: Home / Self Care | Payer: Medicare Other | Source: Ambulatory Visit | Attending: Gastroenterology | Admitting: Gastroenterology

## 2016-07-05 DIAGNOSIS — J4520 Mild intermittent asthma, uncomplicated: Secondary | ICD-10-CM | POA: Diagnosis not present

## 2016-07-05 DIAGNOSIS — R918 Other nonspecific abnormal finding of lung field: Secondary | ICD-10-CM | POA: Diagnosis not present

## 2016-07-05 DIAGNOSIS — R195 Other fecal abnormalities: Secondary | ICD-10-CM | POA: Diagnosis not present

## 2016-07-05 DIAGNOSIS — R197 Diarrhea, unspecified: Secondary | ICD-10-CM | POA: Diagnosis not present

## 2016-07-05 DIAGNOSIS — E669 Obesity, unspecified: Secondary | ICD-10-CM | POA: Diagnosis not present

## 2016-07-05 LAB — GASTROINTESTINAL PANEL BY PCR, STOOL (REPLACES STOOL CULTURE)

## 2016-07-05 LAB — C DIFFICILE QUICK SCREEN W PCR REFLEX
C DIFFICILE (CDIFF) INTERP: NOT DETECTED
C Diff antigen: NEGATIVE
C Diff toxin: NEGATIVE

## 2016-07-08 ENCOUNTER — Ambulatory Visit: Payer: Medicare Other | Attending: Internal Medicine

## 2016-07-08 ENCOUNTER — Encounter: Payer: Medicare Other | Admitting: Physical Therapy

## 2016-07-08 DIAGNOSIS — M6281 Muscle weakness (generalized): Secondary | ICD-10-CM | POA: Diagnosis not present

## 2016-07-08 DIAGNOSIS — R2681 Unsteadiness on feet: Secondary | ICD-10-CM | POA: Diagnosis not present

## 2016-07-08 DIAGNOSIS — Z9181 History of falling: Secondary | ICD-10-CM | POA: Diagnosis not present

## 2016-07-08 DIAGNOSIS — R262 Difficulty in walking, not elsewhere classified: Secondary | ICD-10-CM | POA: Diagnosis not present

## 2016-07-08 NOTE — Therapy (Signed)
[place] MAIN [facility] SERVICES [address] [place], [place], [address] Phone: [phone]   Fax:  [phone]  Physical Therapy Treatment  Patient Details  Name: Rebecca Wolf MRN: [id number] Date of Birth: Nov 14, 1924 Referring Provider: Oneita Kras  Encounter Date: 07/08/2016      PT End of Session - 07/08/16 [id number]    Visit Number 3   Number of Visits 17   Date for PT Re-Evaluation 08/18/16   Authorization Type g codes 2022-09-14   PT Start Time [id number]   PT Stop Time 1730   PT Time Calculation (min) 45 min   Equipment Utilized During Treatment Gait belt   Activity Tolerance Patient tolerated treatment well   Behavior During Therapy [facility] for tasks assessed/performed      Past Medical History:  Diagnosis Date  . Asthma   . Breast cancer ([place])    2003 and 2004, radiation and chemo for 2004 breast cancer  . 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  . GERD (gastroesophageal reflux disease)   . Hemorrhoids   . Hypertension   . Polyp of colon 2002   bleeding polyp of right ascending colon  . PUD (peptic ulcer disease)   . Seizures ([place])    epilepsy well controlled    Past Surgical History:  Procedure Laterality Date  . BREAST EXCISIONAL BIOPSY Left 2003   positive  . BREAST EXCISIONAL BIOPSY Left 2004   positive  . BREAST LUMPECTOMY Left 2004/2005   cancer  . HEMICOLECTOMY Right    bowel obstruction  . HEMORRHOID SURGERY    . THORACOTOMY/LOBECTOMY  1999   carcinoid tumor  . TOTAL VAGINAL HYSTERECTOMY    . VARICOSE VEIN SURGERY      There were no vitals filed for this visit.      Subjective Assessment - 07/08/16 1653    Subjective Patient reports she feels more weak after having the flu. States no falls since the previous visit.    Patient is accompained by: Family member  Daughter   Pertinent History Pt reports that she has difficulty performing sit to stand from chairs and trouble with her balance. She also reports  that her knees have been "locking" up on her over the last couple weeks. She has been using a rollator for approximately 10 years to help with her balance. She has had approximatlely 3 falls over the last 12 months. All of the falls have occurred when she was not using her walker.    Limitations Walking   Patient Stated Goals Improve balance and strength with transfers   Currently in Pain? No/denies   Pain Onset More than a month ago      TREATMENT:  Sit to stands - 4 x 5 with cueing on speed and joint positioning  Seated ball squeezes/glute squeezes in sitting - 2 x 10 4#  Seated marches with GTB - 2 x 10 B  Seated abduction with GTB - 2 x 10 Manual leg press in sitting - 2 x 10 B Marching in standing - 2 x 10 B  Ball roll outs in sitting - x 1.5 min each       PT Education - 07/08/16 1737    Education provided Yes   Education Details Form and technique throughout session   Person(s) Educated Patient   Methods Explanation;Demonstration   Comprehension Verbalized understanding;Returned demonstration             PT Long Term Goals - 06/17/16  [place] #1   Title Pt will be independent with HEP in order to improve strength and balance in order to decrease fall risk and improve function at home and work   Time 8   Period Weeks   Status New     PT LONG TERM GOAL #2   Title Pt will improve ABC by at least 13% in order to demonstrate clinically significant improvement in balance confidence.    Baseline 06/17/16: 43.75%   Time 8   Period Weeks   Status New     PT LONG TERM GOAL #3   Title Pt will improve BERG by at least 3 points in order to demonstrate clinically significant improvement in balance.     Baseline 06/17/16: 17/56   Time 8   Status New     PT LONG TERM GOAL #4   Title Pt will decrease TUG by at least seconds in order to demonstrate decreased fall risk    Baseline 06/17/16: 22.7 seconds   Time 8   Period Weeks     PT LONG TERM GOAL  #5   Title Pt will decrease 5TSTS by at least 3 seconds in order to demonstrate clinically significant improvement in LE strength   Baseline 06/17/16: 40 seconds   Time 8   Period Weeks   Status New               Plan - 07/08/16 1739    Clinical Impression Statement Pt tolerated treatment session with no increase in symptoms throughout. Patient reports increased fatigue at end of session indicating decreased muscular endurance and strength and patient will benefit from further skilled therapy to return to prior level of function.    Rehab Potential Fair   Clinical Impairments Affecting Rehab Potential Positive: motivation; Negative: age, chronicity, bilateral knee pain   PT Frequency 2x / week   PT Duration 8 weeks   PT Treatment/Interventions ADLs/Self Care Home Management;Aquatic Therapy;Canalith Repostioning;Cryotherapy;Electrical Stimulation;Iontophoresis 4mg /ml Dexamethasone;Moist Heat;Traction;Ultrasound;DME Instruction;Gait training;Stair training;Functional mobility training;Therapeutic activities;Therapeutic exercise;Balance training;Neuromuscular re-education;Patient/family education;Wheelchair mobility training;Manual techniques;Passive range of motion;Vestibular   PT Next Visit Plan Progress balance and strengthening. Plan for transition to main clinic in order to utilize parallel bars   PT Home Exercise Plan Seated/supine glut sets, seated adduction squeezes, seated resisted abduction and/or clams, seated/supine hip flexion marching   Consulted and Agree with Plan of Care Patient;Family member/caregiver   Family Member Consulted Daughter      Patient will benefit from skilled therapeutic intervention in order to improve the following deficits and impairments:  Abnormal gait, Decreased balance, Decreased strength, Difficulty walking, Obesity  Visit Diagnosis: Unsteadiness on feet  Muscle weakness (generalized)  History of falling     Problem List Patient Active  Problem List   Diagnosis Date Noted  . Hemorrhoids 05/17/2016  . Obesity (BMI 30-39.9) 04/26/2016  . Multiple lung nodules 04/19/2016  . Chest pain 04/17/2016  . Partial symptomatic epilepsy with complex partial seizures, not intractable, without status epilepticus ([place]) 06/30/2015  . Bronchiectasis without complication ([place]) [id number]  . Imbalance 11/08/2014  . Essential (primary) hypertension 11/08/2014  . Personal history of malignant neoplasm of breast 11/08/2014  . History of malignant carcinoid tumor of bronchus and lung 11/08/2014  . H/O peptic ulcer 11/08/2014  . Asthma, mild intermittent 11/08/2014  . PE (pulmonary embolism) 11/08/2014  . Gonalgia 11/08/2014  . Leg varices 11/08/2014    Blythe Stanford, PT DPT 07/08/2016, 5:44 PM  [facility]  [place] [facility] SERVICES [address] [place], [place], [address] Phone: [phone]   Fax:  [phone]  Name: REYA SCHWEDER MRN: [id number] Date of Birth: Mar 16, 1925

## 2016-07-09 ENCOUNTER — Encounter: Payer: Medicare Other | Admitting: Physical Therapy

## 2016-07-09 ENCOUNTER — Ambulatory Visit: Payer: Medicare Other | Admitting: Podiatry

## 2016-07-10 ENCOUNTER — Ambulatory Visit: Payer: Medicare Other

## 2016-07-11 ENCOUNTER — Encounter: Payer: Medicare Other | Admitting: Physical Therapy

## 2016-07-15 ENCOUNTER — Encounter: Payer: Medicare Other | Admitting: Physical Therapy

## 2016-07-15 ENCOUNTER — Other Ambulatory Visit: Payer: Self-pay | Admitting: *Deleted

## 2016-07-15 ENCOUNTER — Ambulatory Visit: Payer: Medicare Other

## 2016-07-15 DIAGNOSIS — Z9181 History of falling: Secondary | ICD-10-CM

## 2016-07-15 DIAGNOSIS — R262 Difficulty in walking, not elsewhere classified: Secondary | ICD-10-CM | POA: Diagnosis not present

## 2016-07-15 DIAGNOSIS — R2681 Unsteadiness on feet: Secondary | ICD-10-CM

## 2016-07-15 DIAGNOSIS — M6281 Muscle weakness (generalized): Secondary | ICD-10-CM | POA: Diagnosis not present

## 2016-07-15 NOTE — Therapy (Signed)
[place] MAIN [facility] SERVICES [address] [place], [place], [address] Phone: [phone]   Fax:  [phone]  Physical Therapy Treatment  Patient Details  Name: Rebecca Wolf MRN: [id number] Date of Birth: 29-Apr-1925 Referring Provider: Oneita Kras  Encounter Date: 07/15/2016      PT End of Session - 07/15/16 1455    Visit Number 4   Number of Visits 17   Date for PT Re-Evaluation 08/28/2016   Authorization Type g codes 10/25/22   PT Start Time 1440   PT Stop Time 1515   PT Time Calculation (min) 35 min   Equipment Utilized During Treatment Gait belt   Activity Tolerance Patient tolerated treatment well   Behavior During Therapy [facility] for tasks assessed/performed      Past Medical History:  Diagnosis Date  . Asthma   . Breast cancer ([place])    2003 and 2004, radiation and chemo for 2004 breast cancer  . 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  . GERD (gastroesophageal reflux disease)   . Hemorrhoids   . Hypertension   . Polyp of colon 2002   bleeding polyp of right ascending colon  . PUD (peptic ulcer disease)   . Seizures ([place])    epilepsy well controlled    Past Surgical History:  Procedure Laterality Date  . BREAST EXCISIONAL BIOPSY Left 2003   positive  . BREAST EXCISIONAL BIOPSY Left 2004   positive  . BREAST LUMPECTOMY Left 2004/2005   cancer  . HEMICOLECTOMY Right    bowel obstruction  . HEMORRHOID SURGERY    . THORACOTOMY/LOBECTOMY  1999   carcinoid tumor  . TOTAL VAGINAL HYSTERECTOMY    . VARICOSE VEIN SURGERY      There were no vitals filed for this visit.      Subjective Assessment - 07/15/16 1448    Subjective Patients reports she's beginning to feel stronger since the previous visit. Patient no falls since the previous visit.    Patient is accompained by: --  Daughter   Pertinent History Pt reports that she has difficulty performing sit to stand from chairs and trouble with her balance. She  also reports that her knees have been "locking" up on her over the last couple weeks. She has been using a rollator for approximately 10 years to help with her balance. She has had approximatlely 3 falls over the last 12 months. All of the falls have occurred when she was not using her walker.    Limitations Walking   Patient Stated Goals Improve balance and strength with transfers   Currently in Pain? Yes   Pain Location Knee   Pain Orientation Right;Left   Pain Descriptors / Indicators Sore   Pain Type Chronic pain   Pain Onset More than a month ago      TREATMENT:   Nustep - resistance level: 0 for 6 min cueing on speed and performance Ball roll outs in sitting - x 1.5 min each Seated ball squeezes/glute squeezes in sitting - 2 x 10 4#  Manual leg press in sitting - 2 x 10 B Seated marches with GTB - 2 x 10 B  Seated abduction with GTB - 2 x 10 Sit to stands - 2 x 5 with cueing on speed and joint positioning         PT Education - 07/15/16 1455    Education provided Yes   Education Details Reviewed HEP for home   Person(s) Educated Patient  Methods Explanation;Demonstration   Comprehension Verbalized understanding;Returned demonstration             PT Long Term Goals - 06/17/16 1429      PT LONG TERM GOAL #1   Title Pt will be independent with HEP in order to improve strength and balance in order to decrease fall risk and improve function at home and work   Time 8   Period Weeks   Status New     PT LONG TERM GOAL #2   Title Pt will improve ABC by at least 13% in order to demonstrate clinically significant improvement in balance confidence.    Baseline 06/17/16: 43.75%   Time 8   Period Weeks   Status New     PT LONG TERM GOAL #3   Title Pt will improve BERG by at least 3 points in order to demonstrate clinically significant improvement in balance.     Baseline 06/17/16: 17/56   Time 8   Status New     PT LONG TERM GOAL #4   Title Pt will decrease TUG  by at least seconds in order to demonstrate decreased fall risk    Baseline 06/17/16: 22.7 seconds   Time 8   Period Weeks     PT LONG TERM GOAL #5   Title Pt will decrease 5TSTS by at least 3 seconds in order to demonstrate clinically significant improvement in LE strength   Baseline 06/17/16: 40 seconds   Time 8   Period Weeks   Status New               Plan - 07/15/16 1516    Clinical Impression Statement Continued to focus on performing strengthening and endurance based exercises to return to prior level of function. Patient demonstrates decrease in strength and endurance requiring frequent sitting rest breaks throughout treatment session and patient will benefit from further skilled therapy to return to prior level of function.    Rehab Potential Fair   Clinical Impairments Affecting Rehab Potential Positive: motivation; Negative: age, chronicity, bilateral knee pain   PT Frequency 2x / week   PT Duration 8 weeks   PT Treatment/Interventions ADLs/Self Care Home Management;Aquatic Therapy;Canalith Repostioning;Cryotherapy;Electrical Stimulation;Iontophoresis 4mg /ml Dexamethasone;Moist Heat;Traction;Ultrasound;DME Instruction;Gait training;Stair training;Functional mobility training;Therapeutic activities;Therapeutic exercise;Balance training;Neuromuscular re-education;Patient/family education;Wheelchair mobility training;Manual techniques;Passive range of motion;Vestibular   PT Next Visit Plan Progress balance and strengthening. Plan for transition to main clinic in order to utilize parallel bars   PT Home Exercise Plan Seated/supine glut sets, seated adduction squeezes, seated resisted abduction and/or clams, seated/supine hip flexion marching   Consulted and Agree with Plan of Care Patient;Family member/caregiver   Family Member Consulted Daughter      Patient will benefit from skilled therapeutic intervention in order to improve the following deficits and impairments:   Abnormal gait, Decreased balance, Decreased strength, Difficulty walking, Obesity  Visit Diagnosis: Unsteadiness on feet  Muscle weakness (generalized)  History of falling     Problem List Patient Active Problem List   Diagnosis Date Noted  . Hemorrhoids 05/17/2016  . Obesity (BMI 30-39.9) 04/26/2016  . Multiple lung nodules 04/19/2016  . Chest pain 04/17/2016  . Partial symptomatic epilepsy with complex partial seizures, not intractable, without status epilepticus ([place]) 06/30/2015  . Bronchiectasis without complication ([place]) [id number]  . Imbalance 11/08/2014  . Essential (primary) hypertension 11/08/2014  . Personal history of malignant neoplasm of breast 11/08/2014  . History of malignant carcinoid tumor of bronchus and lung 11/08/2014  . H/O peptic ulcer  11/08/2014  . Asthma, mild intermittent 11/08/2014  . PE (pulmonary embolism) 11/08/2014  . Gonalgia 11/08/2014  . Leg varices 11/08/2014    Blythe Stanford, PT DPT 07/15/2016, 3:22 PM  [place] MAIN [facility] SERVICES [address] [place], [place], [address] Phone: [phone]   Fax:  [phone]  Name: Rebecca Wolf MRN: [id number] Date of Birth: March 02, 1925

## 2016-07-17 ENCOUNTER — Ambulatory Visit: Payer: Medicare Other

## 2016-07-18 ENCOUNTER — Ambulatory Visit: Payer: Medicare Other

## 2016-07-18 ENCOUNTER — Encounter: Payer: Medicare Other | Admitting: Physical Therapy

## 2016-07-19 ENCOUNTER — Ambulatory Visit: Payer: Medicare Other | Admitting: Podiatry

## 2016-07-22 ENCOUNTER — Ambulatory Visit (INDEPENDENT_AMBULATORY_CARE_PROVIDER_SITE_OTHER): Payer: Medicare Other | Admitting: Gastroenterology

## 2016-07-22 ENCOUNTER — Encounter: Payer: Medicare Other | Admitting: Physical Therapy

## 2016-07-22 ENCOUNTER — Ambulatory Visit: Payer: Medicare Other

## 2016-07-22 ENCOUNTER — Encounter: Payer: Self-pay | Admitting: Gastroenterology

## 2016-07-22 VITALS — BP 122/82 | HR 80 | Temp 98.0°F | Ht 63.0 in | Wt 182.0 lb

## 2016-07-22 DIAGNOSIS — R2681 Unsteadiness on feet: Secondary | ICD-10-CM | POA: Diagnosis not present

## 2016-07-22 DIAGNOSIS — R109 Unspecified abdominal pain: Secondary | ICD-10-CM

## 2016-07-22 DIAGNOSIS — R262 Difficulty in walking, not elsewhere classified: Secondary | ICD-10-CM | POA: Diagnosis not present

## 2016-07-22 DIAGNOSIS — Z9181 History of falling: Secondary | ICD-10-CM | POA: Diagnosis not present

## 2016-07-22 DIAGNOSIS — M6281 Muscle weakness (generalized): Secondary | ICD-10-CM

## 2016-07-22 NOTE — Patient Instructions (Signed)
Follow up as Needed if symptoms return or worsen.

## 2016-07-22 NOTE — Progress Notes (Signed)
Primary Care Physician: Halina Maidens, MD  Primary Gastroenterologist:  Dr. Jonathon Bellows   Chief Complaint  Patient presents with  . Follow-up   She is here today to follow up to her initial visit on 07/03/16 when she was seen for diarrhea since a few months and abdominal pain of 1 week duration. I felt that the abdominal pain was a post infectious IBS like syndrome.   Interval history 07/03/16-07/22/16  Stool testing for C diff and GI PCR was negative.   No abdominal pain or diarrhea. All resolved.  Current Outpatient Prescriptions  Medication Sig Dispense Refill  . acetaminophen (TYLENOL) 500 MG tablet Take 500 mg by mouth every 6 (six) hours as needed.    Marland Kitchen albuterol (ACCUNEB) 1.25 MG/3ML nebulizer solution Inhale 1 ampule into the lungs 4 (four) times daily as needed.    Marland Kitchen albuterol (PROVENTIL HFA;VENTOLIN HFA) 108 (90 BASE) MCG/ACT inhaler Inhale 1 puff into the lungs 4 (four) times daily.    Marland Kitchen albuterol (PROVENTIL) (2.5 MG/3ML) 0.083% nebulizer solution USE 1 VIAL VIA NEBULIZER EVERY 6 HOURS AS NEEDED FOR WHEEZING 75 mL 5  . amLODipine (NORVASC) 5 MG tablet TAKE 1 TABLET BY MOUTH EVERY DAY 90 tablet 3  . ELIQUIS 2.5 MG TABS tablet TAKE 1 TABLET BY MOUTH TWICE DAILY 60 tablet 12  . fluticasone (FLONASE) 50 MCG/ACT nasal spray Place 2 sprays into the nose daily.    . Fluticasone-Salmeterol (ADVAIR) 500-50 MCG/DOSE AEPB Inhale 1 puff into the lungs 2 (two) times daily.    . hydrocortisone cream 1 % Apply 1 application topically 2 (two) times daily. 30 g 0  . Ibuprofen 200 MG CAPS Take by mouth.    . levETIRAcetam (KEPPRA) 750 MG tablet Take 1 tablet by mouth 2 (two) times daily.    . montelukast (SINGULAIR) 10 MG tablet Take 1 tablet by mouth daily.    . NONFORMULARY OR COMPOUNDED ITEM Peripheral Neuropathy Cream-[facility] 2 refills    . nystatin (MYCOSTATIN) powder Apply topically 3 (three) times daily. 15 g 0  . pantoprazole (PROTONIX) 40 MG tablet TAKE 1 TABLET BY MOUTH  TWICE DAILY 60 tablet 12  . spironolactone-hydrochlorothiazide (ALDACTAZIDE) 25-25 MG tablet TAKE 1 TABLET BY MOUTH DAILY 90 tablet 1  . sucralfate (CARAFATE) 1 g tablet Take 1 tablet (1 g total) by mouth 4 (four) times daily -  with meals and at bedtime. 90 tablet 1  . sucralfate (CARAFATE) 1 g tablet TK 1 T PO QID WITH MEALS AND HS     No current facility-administered medications for this visit.     Allergies as of 07/22/2016 - Review Complete 07/22/2016  Allergen Reaction Noted  . Levofloxacin  11/08/2014  . Sulfa antibiotics  11/08/2014    ROS:  General: Negative for anorexia, weight loss, fever, chills, fatigue, weakness. ENT: Negative for hoarseness, difficulty swallowing , nasal congestion. CV: Negative for chest pain, angina, palpitations, dyspnea on exertion, peripheral edema.  Respiratory: Negative for dyspnea at rest, dyspnea on exertion, cough, sputum, wheezing.  GI: See history of present illness. GU:  Negative for dysuria, hematuria, urinary incontinence, urinary frequency, nocturnal urination.  Endo: Negative for unusual weight change.    Physical Examination:   Pulse 80   Temp 98 F (36.7 C)   Ht 5\' 3"  (1.6 m)   Wt 182 lb (82.6 kg)   BMI 32.24 kg/m   General: Well-nourished, well-developed in no acute distress.  Eyes: No icterus. Conjunctivae pink. Mouth: Oropharyngeal mucosa moist and  pink , no lesions erythema or exudate. Lungs: Clear to auscultation bilaterally. Non-labored. Heart: Regular rate and rhythm, no murmurs rubs or gallops.  Abdomen: Bowel sounds are normal, nontender, nondistended, no hepatosplenomegaly or masses, no abdominal bruits or hernia , no rebound or guarding.   Extremities: No lower extremity edema. No clubbing or deformities. Neuro: Alert and oriented x 3.  Grossly intact. Skin: Warm and dry, no jaundice.   Psych: Alert and cooperative, normal mood and affect.   Imaging Studies: Dg Chest 2 View  Result Date:  06/25/2016 CLINICAL DATA:  Cough and chills.  History of breast carcinoma EXAM: CHEST  2 VIEW COMPARISON:  April 17, 2016 FINDINGS: Scarring in the medial left upper lobe is stable. Postoperative change in the right perihilar and right paratracheal regions is stable. There is no edema or consolidation. Heart size and pulmonary vascularity are normal. There is atherosclerotic calcification in the aorta. There is no evident adenopathy. No blastic or lytic bone lesions. IMPRESSION: Postoperative change on the right. Scarring left upper lobe. No edema or consolidation. Stable cardiac silhouette. There is aortic atherosclerosis. Electronically Signed   By: Lowella Grip III M.D.   On: 06/25/2016 15:10    Assessment and Plan:   Rebecca Wolf 83 y.o. female here today to follow up for abdominal pain and diarrhea. Since last visit it has all resolved and it appears her symptoms were consistent with a post infectious IBS.   F/u as needed.    Dr Jonathon Bellows  Gastroenterology/Hepatology Pager: [phone]

## 2016-07-22 NOTE — Therapy (Signed)
[place] MAIN [facility] SERVICES [address], [place], [address] Phone: [phone]   Fax:  [phone]  Physical Therapy Treatment  Patient Details  Name: Rebecca Wolf MRN: [id number] Date of Birth: Dec 02, 1924 Referring Provider: Oneita Kras  Encounter Date: 07/22/2016      PT End of Session - 07/22/16 1441    Visit Number 5   Number of Visits 17   Date for PT Re-Evaluation 08-25-2016   Authorization Type g codes 5/10   PT Start Time 1430   PT Stop Time 1515   PT Time Calculation (min) 45 min   Equipment Utilized During Treatment Gait belt   Activity Tolerance Patient tolerated treatment well   Behavior During Therapy [facility] for tasks assessed/performed      Past Medical History:  Diagnosis Date  . Asthma   . Breast cancer ([place])    2003 and 2004, radiation and chemo for 2004 breast cancer  . 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  . GERD (gastroesophageal reflux disease)   . Hemorrhoids   . Hypertension   . Polyp of colon 2002   bleeding polyp of right ascending colon  . PUD (peptic ulcer disease)   . Seizures ([place])    epilepsy well controlled    Past Surgical History:  Procedure Laterality Date  . BREAST EXCISIONAL BIOPSY Left 2003   positive  . BREAST EXCISIONAL BIOPSY Left 2004   positive  . BREAST LUMPECTOMY Left 2004/2005   cancer  . HEMICOLECTOMY Right    bowel obstruction  . HEMORRHOID SURGERY    . THORACOTOMY/LOBECTOMY  1999   carcinoid tumor  . TOTAL VAGINAL HYSTERECTOMY    . VARICOSE VEIN SURGERY      There were no vitals filed for this visit.      Subjective Assessment - 07/22/16 1438    Subjective Patient reports minimal soreness after the previous visit which lasted ~24hours. Patient denies any falls since the previous visit.    Patient is accompained by: --  Daughter   Pertinent History Pt reports that she has difficulty performing sit to stand from chairs and trouble with her  balance. She also reports that her knees have been "locking" up on her over the last couple weeks. She has been using a rollator for approximately 10 years to help with her balance. She has had approximatlely 3 falls over the last 12 months. All of the falls have occurred when she was not using her walker.    Limitations Walking   Patient Stated Goals Improve balance and strength with transfers   Currently in Pain? Yes   Pain Score 6    Pain Location Knee   Pain Orientation Right;Left   Pain Descriptors / Indicators Sore   Pain Type Chronic pain   Pain Onset More than a month ago       TREATMENT: Nustep - resistance level: 2 for 6 min cueing on speed and performance Ball roll outs in sitting - x 1.5 min each Seated ball squeezes/glute squeezes in sitting - 2 x 10 4#  Manual leg press in sitting - 3 x 10 B Sit to stands - 2 x 5 with cueing on speed and joint positioning Seated marches with GTB - 2 x 20 B  Seated abduction with GTB - 2 x 30 Standing forward step ups onto airex - 2 x 8 B       PT Education - 07/22/16 1441    Education  provided Yes   Education Details form and technique througout session   Person(s) Educated Patient   Methods Explanation;Demonstration   Comprehension Verbalized understanding;Returned demonstration             PT Long Term Goals - 06/17/16 1429      PT LONG TERM GOAL #1   Title Pt will be independent with HEP in order to improve strength and balance in order to decrease fall risk and improve function at home and work   Time 8   Period Weeks   Status New     PT LONG TERM GOAL #2   Title Pt will improve ABC by at least 13% in order to demonstrate clinically significant improvement in balance confidence.    Baseline 06/17/16: 43.75%   Time 8   Period Weeks   Status New     PT LONG TERM GOAL #3   Title Pt will improve BERG by at least 3 points in order to demonstrate clinically significant improvement in balance.     Baseline 06/17/16:  17/56   Time 8   Status New     PT LONG TERM GOAL #4   Title Pt will decrease TUG by at least seconds in order to demonstrate decreased fall risk    Baseline 06/17/16: 22.7 seconds   Time 8   Period Weeks     PT LONG TERM GOAL #5   Title Pt will decrease 5TSTS by at least 3 seconds in order to demonstrate clinically significant improvement in LE strength   Baseline 06/17/16: 40 seconds   Time 8   Period Weeks   Status New               Plan - 07/22/16 1502    Clinical Impression Statement Patient demonstrates improved hip/LE strength compared to previous visits with ability to perform more repetitions before fatigue. Although patient is improving she  continues to demonstrate difficulty walking and difficulty with balance; patient will benefit from further skilled therapy to return to prior level of function.    Rehab Potential Fair   Clinical Impairments Affecting Rehab Potential Positive: motivation; Negative: age, chronicity, bilateral knee pain   PT Frequency 2x / week   PT Duration 8 weeks   PT Treatment/Interventions ADLs/Self Care Home Management;Aquatic Therapy;Canalith Repostioning;Cryotherapy;Electrical Stimulation;Iontophoresis 4mg /ml Dexamethasone;Moist Heat;Traction;Ultrasound;DME Instruction;Gait training;Stair training;Functional mobility training;Therapeutic activities;Therapeutic exercise;Balance training;Neuromuscular re-education;Patient/family education;Wheelchair mobility training;Manual techniques;Passive range of motion;Vestibular   PT Next Visit Plan Progress balance and strengthening. Plan for transition to main clinic in order to utilize parallel bars   PT Home Exercise Plan Seated/supine glut sets, seated adduction squeezes, seated resisted abduction and/or clams, seated/supine hip flexion marching   Consulted and Agree with Plan of Care Patient;Family member/caregiver   Family Member Consulted Daughter      Patient will benefit from skilled  therapeutic intervention in order to improve the following deficits and impairments:  Abnormal gait, Decreased balance, Decreased strength, Difficulty walking, Obesity  Visit Diagnosis: Unsteadiness on feet  Muscle weakness (generalized)  Difficulty in walking, not elsewhere classified  History of falling     Problem List Patient Active Problem List   Diagnosis Date Noted  . Hemorrhoids 05/17/2016  . Obesity (BMI 30-39.9) 04/26/2016  . Multiple lung nodules 04/19/2016  . Chest pain 04/17/2016  . Partial symptomatic epilepsy with complex partial seizures, not intractable, without status epilepticus ([place]) 06/30/2015  . Bronchiectasis without complication ([place]) [id number]  . Imbalance 11/08/2014  . Essential (primary) hypertension 11/08/2014  . Personal  history of malignant neoplasm of breast 11/08/2014  . History of malignant carcinoid tumor of bronchus and lung 11/08/2014  . H/O peptic ulcer 11/08/2014  . Asthma, mild intermittent 11/08/2014  . PE (pulmonary embolism) 11/08/2014  . Gonalgia 11/08/2014  . Leg varices 11/08/2014    Blythe Stanford, PT DPT 07/22/2016, 3:21 PM  [place] MAIN [facility] SERVICES [address] [place], [place], [address] Phone: [phone]   Fax:  [phone]  Name: Rebecca Wolf MRN: [id number] Date of Birth: 1924-07-08

## 2016-07-23 ENCOUNTER — Ambulatory Visit
Admission: RE | Admit: 2016-07-23 | Discharge: 2016-07-23 | Disposition: A | Discharge status: Home / Self Care | Payer: Medicare Other | Source: Ambulatory Visit | Attending: Specialist | Admitting: Specialist

## 2016-07-23 DIAGNOSIS — J479 Bronchiectasis, uncomplicated: Secondary | ICD-10-CM | POA: Insufficient documentation

## 2016-07-23 DIAGNOSIS — R918 Other nonspecific abnormal finding of lung field: Secondary | ICD-10-CM | POA: Insufficient documentation

## 2016-07-24 ENCOUNTER — Other Ambulatory Visit: Payer: Self-pay

## 2016-07-25 ENCOUNTER — Ambulatory Visit: Payer: Medicare Other

## 2016-07-25 ENCOUNTER — Encounter: Payer: Medicare Other | Admitting: Physical Therapy

## 2016-07-25 DIAGNOSIS — Z9181 History of falling: Secondary | ICD-10-CM

## 2016-07-25 DIAGNOSIS — M6281 Muscle weakness (generalized): Secondary | ICD-10-CM

## 2016-07-25 DIAGNOSIS — R2681 Unsteadiness on feet: Secondary | ICD-10-CM

## 2016-07-25 DIAGNOSIS — R262 Difficulty in walking, not elsewhere classified: Secondary | ICD-10-CM

## 2016-07-25 NOTE — Therapy (Signed)
[place] MAIN [facility] SERVICES [address] [place], [place], [address] Phone: [phone]   Fax:  [phone]  Physical Therapy Treatment  Patient Details  Name: Rebecca Wolf MRN: [id number] Date of Birth: 18-Apr-1925 Referring Provider: Oneita Kras  Encounter Date: 07/25/2016      PT End of Session - 07/25/16 1412    Visit Number 6   Number of Visits 17   Date for PT Re-Evaluation Aug 23, 2016   Authorization Type g codes 6/10   PT Start Time [id number]   PT Stop Time 1430   PT Time Calculation (min) 45 min   Equipment Utilized During Treatment Gait belt   Activity Tolerance Patient tolerated treatment well   Behavior During Therapy [facility] for tasks assessed/performed      Past Medical History:  Diagnosis Date  . Asthma   . Breast cancer ([place])    2003 and 2004, radiation and chemo for 2004 breast cancer  . 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  . GERD (gastroesophageal reflux disease)   . Hemorrhoids   . Hypertension   . Polyp of colon 2002   bleeding polyp of right ascending colon  . PUD (peptic ulcer disease)   . Seizures ([place])    epilepsy well controlled    Past Surgical History:  Procedure Laterality Date  . BREAST EXCISIONAL BIOPSY Left 2003   positive  . BREAST EXCISIONAL BIOPSY Left 2004   positive  . BREAST LUMPECTOMY Left 2004/2005   cancer  . HEMICOLECTOMY Right    bowel obstruction  . HEMORRHOID SURGERY    . THORACOTOMY/LOBECTOMY  1999   carcinoid tumor  . TOTAL VAGINAL HYSTERECTOMY    . VARICOSE VEIN SURGERY      There were no vitals filed for this visit.      Subjective Assessment - 07/25/16 1355    Subjective Patient reports singificant soreness after the previous visit. Patient denies any falls; patient's daughter reports pt is getting much stronger.    Patient is accompained by: --  Daughter   Pertinent History Pt reports that she has difficulty performing sit to stand from chairs and trouble  with her balance. She also reports that her knees have been "locking" up on her over the last couple weeks. She has been using a rollator for approximately 10 years to help with her balance. She has had approximatlely 3 falls over the last 12 months. All of the falls have occurred when she was not using her walker.    Limitations Walking   Patient Stated Goals Improve balance and strength with transfers   Currently in Pain? Yes   Pain Score 6    Pain Location Knee   Pain Orientation Right;Left   Pain Descriptors / Indicators Sore   Pain Onset More than a month ago   Pain Frequency Constant      TREATMENT: Nustep - resistance level: 2 for 6 min cueing on speed and performance LAQ in sitting - x 20 with cueing on speed and performing throughout full AROM Sitting hamstring curls against GTB - 2 x 10 B Seated plantarflexion against GTB - 2 x 20 B  Seated marches with GTB - 2 x 20 B  Seated abduction with GTB - 2 x 30 Seated ball squeezes/glute squeezes in sitting - x20 4#  Manual leg press in sitting - 3 x 10 B Supine bridges throughout shorted AROM - 3 x 10 with VC on activation of gluteal musculature  Hip  abduction in supine - x 10        PT Education - 07/25/16 1357    Education provided Yes   Education Details Educated on exercise intensity and moderating home program to soreness intensity   Person(s) Educated Patient   Methods Explanation;Demonstration   Comprehension Verbalized understanding;Returned demonstration             PT Long Term Goals - 06/17/16 1429      PT LONG TERM GOAL #1   Title Pt will be independent with HEP in order to improve strength and balance in order to decrease fall risk and improve function at home and work   Time 8   Period Weeks   Status New     PT LONG TERM GOAL #2   Title Pt will improve ABC by at least 13% in order to demonstrate clinically significant improvement in balance confidence.    Baseline 06/17/16: 43.75%   Time 8    Period Weeks   Status New     PT LONG TERM GOAL #3   Title Pt will improve BERG by at least 3 points in order to demonstrate clinically significant improvement in balance.     Baseline 06/17/16: 17/56   Time 8   Status New     PT LONG TERM GOAL #4   Title Pt will decrease TUG by at least seconds in order to demonstrate decreased fall risk    Baseline 06/17/16: 22.7 seconds   Time 8   Period Weeks     PT LONG TERM GOAL #5   Title Pt will decrease 5TSTS by at least 3 seconds in order to demonstrate clinically significant improvement in LE strength   Baseline 06/17/16: 40 seconds   Time 8   Period Weeks   Status New               Plan - 07/25/16 1413    Clinical Impression Statement Performed more sitting and supine exercises today secondary to patient reporting increased soreness after previous treatment session. Will progress to further standing exercises in future treatment sessions. Patient continues to demonstrate decreased strength and endurance and will benefit from further skilled therapy to return to prior level of function.    Rehab Potential Fair   Clinical Impairments Affecting Rehab Potential Positive: motivation; Negative: age, chronicity, bilateral knee pain   PT Frequency 2x / week   PT Duration 8 weeks   PT Treatment/Interventions ADLs/Self Care Home Management;Aquatic Therapy;Canalith Repostioning;Cryotherapy;Electrical Stimulation;Iontophoresis 4mg /ml Dexamethasone;Moist Heat;Traction;Ultrasound;DME Instruction;Gait training;Stair training;Functional mobility training;Therapeutic activities;Therapeutic exercise;Balance training;Neuromuscular re-education;Patient/family education;Wheelchair mobility training;Manual techniques;Passive range of motion;Vestibular   PT Next Visit Plan Progress balance and strengthening. Plan for transition to main clinic in order to utilize parallel bars   PT Home Exercise Plan Seated/supine glut sets, seated adduction squeezes,  seated resisted abduction and/or clams, seated/supine hip flexion marching   Consulted and Agree with Plan of Care Patient;Family member/caregiver   Family Member Consulted Daughter      Patient will benefit from skilled therapeutic intervention in order to improve the following deficits and impairments:  Abnormal gait, Decreased balance, Decreased strength, Difficulty walking, Obesity  Visit Diagnosis: Muscle weakness (generalized)  Difficulty in walking, not elsewhere classified  Unsteadiness on feet  History of falling     Problem List Patient Active Problem List   Diagnosis Date Noted  . Hemorrhoids 05/17/2016  . Obesity (BMI 30-39.9) 04/26/2016  . Multiple lung nodules 04/19/2016  . Chest pain 04/17/2016  . Partial symptomatic epilepsy  with complex partial seizures, not intractable, without status epilepticus ([place]) 06/30/2015  . Bronchiectasis without complication ([place]) [id number]  . Imbalance 11/08/2014  . Essential (primary) hypertension 11/08/2014  . Personal history of malignant neoplasm of breast 11/08/2014  . History of malignant carcinoid tumor of bronchus and lung 11/08/2014  . H/O peptic ulcer 11/08/2014  . Asthma, mild intermittent 11/08/2014  . PE (pulmonary embolism) 11/08/2014  . Gonalgia 11/08/2014  . Leg varices 11/08/2014    Blythe Stanford, PT DPT 07/25/2016, 2:29 PM  [place] MAIN [facility] SERVICES [address] [place], [place], [address] Phone: [phone]   Fax:  [phone]  Name: CHINMAYI MULLIS MRN: [id number] Date of Birth: 06/25/1925

## 2016-07-26 ENCOUNTER — Ambulatory Visit (INDEPENDENT_AMBULATORY_CARE_PROVIDER_SITE_OTHER): Payer: Medicare Other | Admitting: Podiatry

## 2016-07-26 DIAGNOSIS — B351 Tinea unguium: Secondary | ICD-10-CM

## 2016-07-26 DIAGNOSIS — L603 Nail dystrophy: Secondary | ICD-10-CM

## 2016-07-26 DIAGNOSIS — L608 Other nail disorders: Secondary | ICD-10-CM

## 2016-07-26 DIAGNOSIS — M79609 Pain in unspecified limb: Secondary | ICD-10-CM

## 2016-07-29 ENCOUNTER — Ambulatory Visit: Payer: Medicare Other

## 2016-07-29 DIAGNOSIS — R262 Difficulty in walking, not elsewhere classified: Secondary | ICD-10-CM

## 2016-07-29 DIAGNOSIS — M6281 Muscle weakness (generalized): Secondary | ICD-10-CM

## 2016-07-29 DIAGNOSIS — Z9181 History of falling: Secondary | ICD-10-CM

## 2016-07-29 DIAGNOSIS — R2681 Unsteadiness on feet: Secondary | ICD-10-CM | POA: Diagnosis not present

## 2016-07-29 NOTE — Therapy (Signed)
[place] MAIN [facility] SERVICES [address] [place], [place], [address] Phone: [phone]   Fax:  [phone]  Physical Therapy Treatment  Patient Details  Name: Rebecca Wolf MRN: [id number] Date of Birth: 1925-04-15 Referring Provider: Oneita Kras  Encounter Date: 07/29/2016      PT End of Session - 07/29/16 1458    Visit Number 7   Number of Visits 17   Date for PT Re-Evaluation Aug 24, 2016   Authorization Type g codes 7/10   PT Start Time [id number]   PT Stop Time 1515   PT Time Calculation (min) 30 min   Equipment Utilized During Treatment Gait belt   Activity Tolerance Patient tolerated treatment well   Behavior During Therapy [facility] for tasks assessed/performed      Past Medical History:  Diagnosis Date  . Asthma   . Breast cancer ([place])    2003 and 2004, radiation and chemo for 2004 breast cancer  . 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  . GERD (gastroesophageal reflux disease)   . Hemorrhoids   . Hypertension   . Polyp of colon 2002   bleeding polyp of right ascending colon  . PUD (peptic ulcer disease)   . Seizures ([place])    epilepsy well controlled    Past Surgical History:  Procedure Laterality Date  . BREAST EXCISIONAL BIOPSY Left 2003   positive  . BREAST EXCISIONAL BIOPSY Left 2004   positive  . BREAST LUMPECTOMY Left 2004/2005   cancer  . HEMICOLECTOMY Right    bowel obstruction  . HEMORRHOID SURGERY    . THORACOTOMY/LOBECTOMY  1999   carcinoid tumor  . TOTAL VAGINAL HYSTERECTOMY    . VARICOSE VEIN SURGERY      There were no vitals filed for this visit.      Subjective Assessment - 07/29/16 1448    Subjective Patient reports her legs are feeling stronger reporting she can perform sit to stand with less effort.    Patient is accompained by: --  Daughter   Pertinent History Pt reports that she has difficulty performing sit to stand from chairs and trouble with her balance. She also reports that  her knees have been "locking" up on her over the last couple weeks. She has been using a rollator for approximately 10 years to help with her balance. She has had approximatlely 3 falls over the last 12 months. All of the falls have occurred when she was not using her walker.    Limitations Walking   Patient Stated Goals Improve balance and strength with transfers   Currently in Pain? Yes   Pain Score 2    Pain Location Knee   Pain Orientation Right;Left   Pain Descriptors / Indicators Sore   Pain Type Chronic pain   Pain Onset More than a month ago   Pain Frequency Constant        TREATMENT: Nustep - resistance level: 2 for 6 min cueing on speed and performance Sit to stands - 4 x 5  Step ups onto 6" step - x10  Hip adductions with glute activation with 4# ball - 2 x 10  Side stepping onto blue airex pad - 2 x5       PT Education - 07/29/16 1456    Education provided Yes   Education Details form/technique with exercise    Person(s) Educated Patient   Methods Explanation;Demonstration   Comprehension Verbalized understanding;Returned demonstration  PT Long Term Goals - 06/17/16 1429      PT LONG TERM GOAL #1   Title Pt will be independent with HEP in order to improve strength and balance in order to decrease fall risk and improve function at home and work   Time 8   Period Weeks   Status New     PT LONG TERM GOAL #2   Title Pt will improve ABC by at least 13% in order to demonstrate clinically significant improvement in balance confidence.    Baseline 06/17/16: 43.75%   Time 8   Period Weeks   Status New     PT LONG TERM GOAL #3   Title Pt will improve BERG by at least 3 points in order to demonstrate clinically significant improvement in balance.     Baseline 06/17/16: 17/56   Time 8   Status New     PT LONG TERM GOAL #4   Title Pt will decrease TUG by at least seconds in order to demonstrate decreased fall risk    Baseline 06/17/16: 22.7  seconds   Time 8   Period Weeks     PT LONG TERM GOAL #5   Title Pt will decrease 5TSTS by at least 3 seconds in order to demonstrate clinically significant improvement in LE strength   Baseline 06/17/16: 40 seconds   Time 8   Period Weeks   Status New               Plan - 07/29/16 1631    Clinical Impression Statement Performed shortened session today secondary to patient arriving 15 min late for her appointment. Focused on performing quadriceps and gluteal strengthening to improve ability to transfer from sitting to standing today and patient demonstrates less use of UE to perform exercises and will benefit from further skilled therapy to return to prior level of function.    Rehab Potential Fair   Clinical Impairments Affecting Rehab Potential Positive: motivation; Negative: age, chronicity, bilateral knee pain   PT Frequency 2x / week   PT Duration 8 weeks   PT Treatment/Interventions ADLs/Self Care Home Management;Aquatic Therapy;Canalith Repostioning;Cryotherapy;Electrical Stimulation;Iontophoresis 4mg /ml Dexamethasone;Moist Heat;Traction;Ultrasound;DME Instruction;Gait training;Stair training;Functional mobility training;Therapeutic activities;Therapeutic exercise;Balance training;Neuromuscular re-education;Patient/family education;Wheelchair mobility training;Manual techniques;Passive range of motion;Vestibular   PT Next Visit Plan Progress balance and strengthening. Plan for transition to main clinic in order to utilize parallel bars   PT Home Exercise Plan Seated/supine glut sets, seated adduction squeezes, seated resisted abduction and/or clams, seated/supine hip flexion marching   Consulted and Agree with Plan of Care Patient;Family member/caregiver   Family Member Consulted Daughter      Patient will benefit from skilled therapeutic intervention in order to improve the following deficits and impairments:  Abnormal gait, Decreased balance, Decreased strength, Difficulty  walking, Obesity  Visit Diagnosis: Muscle weakness (generalized)  Difficulty in walking, not elsewhere classified  Unsteadiness on feet  History of falling     Problem List Patient Active Problem List   Diagnosis Date Noted  . Hemorrhoids 05/17/2016  . Obesity (BMI 30-39.9) 04/26/2016  . Multiple lung nodules 04/19/2016  . Chest pain 04/17/2016  . Partial symptomatic epilepsy with complex partial seizures, not intractable, without status epilepticus ([place]) 06/30/2015  . Bronchiectasis without complication ([place]) [id number]  . Imbalance 11/08/2014  . Essential (primary) hypertension 11/08/2014  . Personal history of malignant neoplasm of breast 11/08/2014  . History of malignant carcinoid tumor of bronchus and lung 11/08/2014  . H/O peptic ulcer 11/08/2014  . Asthma, mild  intermittent 11/08/2014  . PE (pulmonary embolism) 11/08/2014  . Gonalgia 11/08/2014  . Leg varices 11/08/2014    Blythe Stanford, PT DPT 07/29/2016, 4:41 PM  [place] MAIN [facility] SERVICES [address] [place], [place], [address] Phone: [phone]   Fax:  [phone]  Name: Rebecca Wolf MRN: [id number] Date of Birth: [birth year]

## 2016-07-31 ENCOUNTER — Other Ambulatory Visit: Payer: Self-pay | Admitting: Specialist

## 2016-07-31 ENCOUNTER — Ambulatory Visit: Payer: Medicare Other

## 2016-07-31 ENCOUNTER — Other Ambulatory Visit: Payer: Self-pay | Admitting: Internal Medicine

## 2016-07-31 DIAGNOSIS — R918 Other nonspecific abnormal finding of lung field: Secondary | ICD-10-CM

## 2016-08-01 ENCOUNTER — Ambulatory Visit: Payer: Medicare Other | Attending: Internal Medicine

## 2016-08-01 DIAGNOSIS — R2681 Unsteadiness on feet: Secondary | ICD-10-CM

## 2016-08-01 DIAGNOSIS — R262 Difficulty in walking, not elsewhere classified: Secondary | ICD-10-CM | POA: Diagnosis not present

## 2016-08-01 DIAGNOSIS — Z9181 History of falling: Secondary | ICD-10-CM | POA: Diagnosis not present

## 2016-08-01 DIAGNOSIS — M6281 Muscle weakness (generalized): Secondary | ICD-10-CM | POA: Diagnosis not present

## 2016-08-01 NOTE — Therapy (Signed)
[place] MAIN [facility] SERVICES [address] [place], [place], [address] Phone: [phone]   Fax:  [phone]  Physical Therapy Treatment  Patient Details  Name: Rebecca Wolf MRN: [id number] Date of Birth: 1924/08/17 Referring Provider: Oneita Kras  Encounter Date: 08/01/2016      PT End of Session - 08/01/16 1339    Visit Number 8   Number of Visits 17   Date for PT Re-Evaluation 08/24/2016   Authorization Type g codes 8/10   PT Start Time 1302   PT Stop Time 1345   PT Time Calculation (min) 43 min   Equipment Utilized During Treatment Gait belt   Activity Tolerance Patient tolerated treatment well   Behavior During Therapy [facility] for tasks assessed/performed      Past Medical History:  Diagnosis Date  . Asthma   . Breast cancer ([place])    2003 and 2004, radiation and chemo for 2004 breast cancer  . 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  . GERD (gastroesophageal reflux disease)   . Hemorrhoids   . Hypertension   . Polyp of colon 2002   bleeding polyp of right ascending colon  . PUD (peptic ulcer disease)   . Seizures ([place])    epilepsy well controlled    Past Surgical History:  Procedure Laterality Date  . BREAST EXCISIONAL BIOPSY Left 2003   positive  . BREAST EXCISIONAL BIOPSY Left 2004   positive  . BREAST LUMPECTOMY Left 2004/2005   cancer  . HEMICOLECTOMY Right    bowel obstruction  . HEMORRHOID SURGERY    . THORACOTOMY/LOBECTOMY  1999   carcinoid tumor  . TOTAL VAGINAL HYSTERECTOMY    . VARICOSE VEIN SURGERY      There were no vitals filed for this visit.      Subjective Assessment - 08/01/16 1318    Subjective Patient reports increased knee soreness today. States she is doing alright otherwise.    Patient is accompained by: --  Daughter   Pertinent History Pt reports that she has difficulty performing sit to stand from chairs and trouble with her balance. She also reports that her knees have been  "locking" up on her over the last couple weeks. She has been using a rollator for approximately 10 years to help with her balance. She has had approximatlely 3 falls over the last 12 months. All of the falls have occurred when she was not using her walker.    Limitations Walking   Patient Stated Goals Improve balance and strength with transfers   Currently in Pain? Yes   Pain Score 7    Pain Location Knee   Pain Orientation Right;Left   Pain Descriptors / Indicators Sore   Pain Type Chronic pain   Pain Onset More than a month ago         TREATMENT: Nustep - resistance level: 5 for 6 min cueing on speed and performance Sit to stands - 2 x 5  Hip adduction/glute squeezes against yellow ball (4# ball) - 2 x 20  Stepping over half foam with UE support -  x 10 B  Side stepping over half foam with UE support - x 10 B Hip abduction while standing on airex with UE support - x10 Marching on airex blue pad - x 10 Feet together & feet apart balancing on blue airex pad - 10sec x 3 Feet apart with widened tandem forward/backward weight shifts - x10  PT Education - 08/01/16 1334    Education provided Yes   Education Details reviewed form/technique throughout treatment session   Person(s) Educated Patient   Methods Explanation;Demonstration   Comprehension Verbalized understanding;Returned demonstration             PT Long Term Goals - 06/17/16 1429      PT LONG TERM GOAL #1   Title Pt will be independent with HEP in order to improve strength and balance in order to decrease fall risk and improve function at home and work   Time 8   Period Weeks   Status New     PT LONG TERM GOAL #2   Title Pt will improve ABC by at least 13% in order to demonstrate clinically significant improvement in balance confidence.    Baseline 06/17/16: 43.75%   Time 8   Period Weeks   Status New     PT LONG TERM GOAL #3   Title Pt will improve BERG by at least 3 points in order to  demonstrate clinically significant improvement in balance.     Baseline 06/17/16: 17/56   Time 8   Status New     PT LONG TERM GOAL #4   Title Pt will decrease TUG by at least seconds in order to demonstrate decreased fall risk    Baseline 06/17/16: 22.7 seconds   Time 8   Period Weeks     PT LONG TERM GOAL #5   Title Pt will decrease 5TSTS by at least 3 seconds in order to demonstrate clinically significant improvement in LE strength   Baseline 06/17/16: 40 seconds   Time 8   Period Weeks   Status New               Plan - 08/01/16 1340    Clinical Impression Statement Performed more weight bearing exercises today to improve muscular strength and endurance in the lower extremities. Patient requires frequent sitting rest breaks throughout the session secondary to weakness. Patient will benefit from further skilled therapy focused on improving limitations to decrease fall risk.    Rehab Potential Fair   Clinical Impairments Affecting Rehab Potential Positive: motivation; Negative: age, chronicity, bilateral knee pain   PT Frequency 2x / week   PT Duration 8 weeks   PT Treatment/Interventions ADLs/Self Care Home Management;Aquatic Therapy;Canalith Repostioning;Cryotherapy;Electrical Stimulation;Iontophoresis 4mg /ml Dexamethasone;Moist Heat;Traction;Ultrasound;DME Instruction;Gait training;Stair training;Functional mobility training;Therapeutic activities;Therapeutic exercise;Balance training;Neuromuscular re-education;Patient/family education;Wheelchair mobility training;Manual techniques;Passive range of motion;Vestibular   PT Next Visit Plan Progress balance and strengthening. Plan for transition to main clinic in order to utilize parallel bars   PT Home Exercise Plan Seated/supine glut sets, seated adduction squeezes, seated resisted abduction and/or clams, seated/supine hip flexion marching   Consulted and Agree with Plan of Care Patient;Family member/caregiver   Family Member  Consulted Daughter      Patient will benefit from skilled therapeutic intervention in order to improve the following deficits and impairments:  Abnormal gait, Decreased balance, Decreased strength, Difficulty walking, Obesity  Visit Diagnosis: Muscle weakness (generalized)  Difficulty in walking, not elsewhere classified  Unsteadiness on feet  History of falling     Problem List Patient Active Problem List   Diagnosis Date Noted  . Hemorrhoids 05/17/2016  . Obesity (BMI 30-39.9) 04/26/2016  . Multiple lung nodules 04/19/2016  . Chest pain 04/17/2016  . Partial symptomatic epilepsy with complex partial seizures, not intractable, without status epilepticus ([place]) 06/30/2015  . Bronchiectasis without complication ([place]) [id number]  . Imbalance 11/08/2014  . Essential (  primary) hypertension 11/08/2014  . Personal history of malignant neoplasm of breast 11/08/2014  . History of malignant carcinoid tumor of bronchus and lung 11/08/2014  . H/O peptic ulcer 11/08/2014  . Asthma, mild intermittent 11/08/2014  . PE (pulmonary embolism) 11/08/2014  . Gonalgia 11/08/2014  . Leg varices 11/08/2014    Blythe Stanford, PT DPT 08/01/2016, 1:46 PM  [place] MAIN [facility] SERVICES [address] [place], [place], [address] Phone: [phone]   Fax:  [phone]  Name: VIENNA FILIPOVIC MRN: [id number] Date of Birth: 1925/03/03

## 2016-08-04 NOTE — Progress Notes (Signed)
   SUBJECTIVE Patient  presents to office today complaining of elongated, thickened nails. Pain while ambulating in shoes. Patient is unable to trim their own nails.   OBJECTIVE General Patient is awake, alert, and oriented x 3 and in no acute distress. Derm Skin is dry and supple bilateral. Negative open lesions or macerations. Remaining integument unremarkable. Nails are tender, long, thickened and dystrophic with subungual debris, consistent with onychomycosis, 1-5 bilateral. No signs of infection noted. Vasc  DP and PT pedal pulses palpable bilaterally. Temperature gradient within normal limits.  Neuro Epicritic and protective threshold sensation diminished bilaterally.  Musculoskeletal Exam No symptomatic pedal deformities noted bilateral. Muscular strength within normal limits.  ASSESSMENT 1. Onychodystrophic nails 1-5 bilateral with hyperkeratosis of nails.  2. Onychomycosis of nail due to dermatophyte bilateral 3. Pain in foot bilateral  PLAN OF CARE 1. Patient evaluated today.  2. Instructed to maintain good pedal hygiene and foot care.  3. Mechanical debridement of nails 1-5 bilaterally performed using a nail nipper. Filed with dremel without incident.  4. Return to clinic in 3 mos.    Inika Bellanger M. Shanan Fitzpatrick, DPM [facility]  Dr. Syana Degraffenreid M. Delani Kohli, DPM    [address]                                        [place], [place] [address]                Office [phone]  Fax [phone]      

## 2016-08-06 ENCOUNTER — Ambulatory Visit: Payer: Medicare Other

## 2016-08-06 DIAGNOSIS — R2681 Unsteadiness on feet: Secondary | ICD-10-CM

## 2016-08-06 DIAGNOSIS — R262 Difficulty in walking, not elsewhere classified: Secondary | ICD-10-CM | POA: Diagnosis not present

## 2016-08-06 DIAGNOSIS — M6281 Muscle weakness (generalized): Secondary | ICD-10-CM | POA: Diagnosis not present

## 2016-08-06 DIAGNOSIS — Z9181 History of falling: Secondary | ICD-10-CM

## 2016-08-06 NOTE — Therapy (Signed)
[place] MAIN [facility] SERVICES [address] [place], [place], [address] Phone: [phone]   Fax:  [phone]  Physical Therapy Treatment  Patient Details  Name: Rebecca Wolf MRN: [id number] Date of Birth: 10-Oct-1924 Referring Provider: Oneita Kras  Encounter Date: 08/06/2016      PT End of Session - 08/06/16 1322    Visit Number 9   Number of Visits 17   Date for PT Re-Evaluation 09-03-2016   Authorization Type g codes 2023/04/02   PT Start Time 21-Oct-1314   PT Stop Time 1355   PT Time Calculation (min) 39 min   Equipment Utilized During Treatment Gait belt   Activity Tolerance Patient tolerated treatment well   Behavior During Therapy [facility] for tasks assessed/performed      Past Medical History:  Diagnosis Date  . Asthma   . Breast cancer ([place])    October 20, 2001 and October 21, 2002, radiation and chemo for 10/21/02 breast cancer  . Carcinoid tumor determined by biopsy of lung 1999-10-21  . GERD (gastroesophageal reflux disease)   . Hemorrhoids   . Hypertension   . Polyp of colon Oct 20, 2000   bleeding polyp of right ascending colon  . PUD (peptic ulcer disease)   . Seizures ([place])    epilepsy well controlled    Past Surgical History:  Procedure Laterality Date  . BREAST EXCISIONAL BIOPSY Left 10/20/2001   positive  . BREAST EXCISIONAL BIOPSY Left 10-21-2002   positive  . BREAST LUMPECTOMY Left 2004/2005   cancer  . HEMICOLECTOMY Right    bowel obstruction  . HEMORRHOID SURGERY    . THORACOTOMY/LOBECTOMY  1999   carcinoid tumor  . TOTAL VAGINAL HYSTERECTOMY    . VARICOSE VEIN SURGERY      There were no vitals filed for this visit.      Subjective Assessment - 08/06/16 1320    Subjective Patient reports her legs feel sore in the morning. Patient states no changes otherwise.    Patient is accompained by: --  Daughter   Pertinent History Pt reports that she has difficulty performing sit to stand from chairs and trouble with her balance. She also reports that her knees have  been "locking" up on her over the last couple weeks. She has been using a rollator for approximately 10 years to help with her balance. She has had approximatlely 3 falls over the last 12 months. All of the falls have occurred when she was not using her walker.    Limitations Walking   Patient Stated Goals Improve balance and strength with transfers   Currently in Pain? Yes   Pain Score 7    Pain Location Knee   Pain Orientation Right;Left   Pain Descriptors / Indicators Sore   Pain Type Chronic pain   Pain Onset More than a month ago      TREATMENT: Nustep - resistance level: 4 for 5.5 min cueing on speed and performance Sit to stands - 2 x 7 with sitting on an airex Ambulation with SPC for 61ft; ambulation with quad cane for 19ft; patient required cueing on stride length and speed of movement Seated hip abduction with GTB - x 25 Hip adduction/glute squeezes against yellow ball (4# ball) - 2 x 20  Seated ball roll outs with 4# ball -  2 min B        PT Education - 08/06/16 1321    Education provided Yes   Education Details Reviewed balancing techniques throughout session   Person(s)  Educated Patient   Methods Explanation;Demonstration   Comprehension Verbalized understanding;Returned demonstration             PT Long Term Goals - 06/17/16 1429      PT LONG TERM GOAL #1   Title Pt will be independent with HEP in order to improve strength and balance in order to decrease fall risk and improve function at home and work   Time 8   Period Weeks   Status New     PT LONG TERM GOAL #2   Title Pt will improve ABC by at least 13% in order to demonstrate clinically significant improvement in balance confidence.    Baseline 06/17/16: 43.75%   Time 8   Period Weeks   Status New     PT LONG TERM GOAL #3   Title Pt will improve BERG by at least 3 points in order to demonstrate clinically significant improvement in balance.     Baseline 06/17/16: 17/56   Time 8   Status New      PT LONG TERM GOAL #4   Title Pt will decrease TUG by at least seconds in order to demonstrate decreased fall risk    Baseline 06/17/16: 22.7 seconds   Time 8   Period Weeks     PT LONG TERM GOAL #5   Title Pt will decrease 5TSTS by at least 3 seconds in order to demonstrate clinically significant improvement in LE strength   Baseline 06/17/16: 40 seconds   Time 8   Period Weeks   Status New               Plan - 08/06/16 1406    Clinical Impression Statement Patient c/o increased B knee pain today which is exaccerbated with standing. Performed exercises to help decrease knee pain and improve LE strength. Patient continues to demonstrate decreased LE strength and patient will benefit from further skilled therapy to return to prior level of function.    Rehab Potential Fair   Clinical Impairments Affecting Rehab Potential Positive: motivation; Negative: age, chronicity, bilateral knee pain   PT Frequency 2x / week   PT Duration 8 weeks   PT Treatment/Interventions ADLs/Self Care Home Management;Aquatic Therapy;Canalith Repostioning;Cryotherapy;Electrical Stimulation;Iontophoresis 4mg /ml Dexamethasone;Moist Heat;Traction;Ultrasound;DME Instruction;Gait training;Stair training;Functional mobility training;Therapeutic activities;Therapeutic exercise;Balance training;Neuromuscular re-education;Patient/family education;Wheelchair mobility training;Manual techniques;Passive range of motion;Vestibular   PT Next Visit Plan Progress balance and strengthening. Plan for transition to main clinic in order to utilize parallel bars   PT Home Exercise Plan Seated/supine glut sets, seated adduction squeezes, seated resisted abduction and/or clams, seated/supine hip flexion marching   Consulted and Agree with Plan of Care Patient;Family member/caregiver   Family Member Consulted Daughter      Patient will benefit from skilled therapeutic intervention in order to improve the following deficits  and impairments:  Abnormal gait, Decreased balance, Decreased strength, Difficulty walking, Obesity  Visit Diagnosis: Muscle weakness (generalized)  Difficulty in walking, not elsewhere classified  Unsteadiness on feet  History of falling     Problem List Patient Active Problem List   Diagnosis Date Noted  . Hemorrhoids 05/17/2016  . Obesity (BMI 30-39.9) 04/26/2016  . Multiple lung nodules 04/19/2016  . Chest pain 04/17/2016  . Partial symptomatic epilepsy with complex partial seizures, not intractable, without status epilepticus ([place]) 06/30/2015  . Bronchiectasis without complication ([place]) [id number]  . Imbalance 11/08/2014  . Essential (primary) hypertension 11/08/2014  . Personal history of malignant neoplasm of breast 11/08/2014  . History of malignant carcinoid tumor of  bronchus and lung 11/08/2014  . H/O peptic ulcer 11/08/2014  . Asthma, mild intermittent 11/08/2014  . PE (pulmonary embolism) 11/08/2014  . Gonalgia 11/08/2014  . Leg varices 11/08/2014    Blythe Stanford, PT DPT 08/06/2016, 2:23 PM  [place] MAIN [facility] SERVICES [address] [place], [place], [address] Phone: [phone]   Fax:  [phone]  Name: KAYLANII FAHERTY MRN: [id number] Date of Birth: [birth year]

## 2016-08-12 ENCOUNTER — Ambulatory Visit: Payer: Medicare Other

## 2016-08-14 DIAGNOSIS — H1033 Unspecified acute conjunctivitis, bilateral: Secondary | ICD-10-CM | POA: Diagnosis not present

## 2016-08-15 ENCOUNTER — Ambulatory Visit: Payer: Medicare Other

## 2016-08-15 DIAGNOSIS — R2681 Unsteadiness on feet: Secondary | ICD-10-CM

## 2016-08-15 DIAGNOSIS — R262 Difficulty in walking, not elsewhere classified: Secondary | ICD-10-CM

## 2016-08-15 DIAGNOSIS — Z9181 History of falling: Secondary | ICD-10-CM | POA: Diagnosis not present

## 2016-08-15 DIAGNOSIS — M6281 Muscle weakness (generalized): Secondary | ICD-10-CM | POA: Diagnosis not present

## 2016-08-15 NOTE — Therapy (Signed)
[place] MAIN [facility] SERVICES [address] [place], [place], [address] Phone: [phone]   Fax:  [phone]  Physical Therapy Treatment  Patient Details  Name: Rebecca Wolf MRN: [id number] Date of Birth: 1925/01/20 Referring Provider: Oneita Kras  Encounter Date: 08/15/2016      PT End of Session - 08/15/16 1450    Visit Number 10   Number of Visits 17   Date for PT Re-Evaluation 10-19-2016   Authorization Type g codes 10/10   PT Start Time 1440   PT Stop Time 1515   PT Time Calculation (min) 35 min   Equipment Utilized During Treatment Gait belt   Activity Tolerance Patient tolerated treatment well   Behavior During Therapy [facility] for tasks assessed/performed      Past Medical History:  Diagnosis Date  . Asthma   . Breast cancer ([place])    2003 and 2004, radiation and chemo for 2004 breast cancer  . 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  . GERD (gastroesophageal reflux disease)   . Hemorrhoids   . Hypertension   . Polyp of colon 2002   bleeding polyp of right ascending colon  . PUD (peptic ulcer disease)   . Seizures ([place])    epilepsy well controlled    Past Surgical History:  Procedure Laterality Date  . BREAST EXCISIONAL BIOPSY Left 2003   positive  . BREAST EXCISIONAL BIOPSY Left 2004   positive  . BREAST LUMPECTOMY Left 2004/2005   cancer  . HEMICOLECTOMY Right    bowel obstruction  . HEMORRHOID SURGERY    . THORACOTOMY/LOBECTOMY  1999   carcinoid tumor  . TOTAL VAGINAL HYSTERECTOMY    . VARICOSE VEIN SURGERY      There were no vitals filed for this visit.      Subjective Assessment - 08/15/16 1446    Subjective Patient reports her legs feel sore stating they feel strong but very sore.   Patient is accompained by: --  Daughter   Pertinent History Pt reports that she has difficulty performing sit to stand from chairs and trouble with her balance. She also reports that her knees have been  "locking" up on her over the last couple weeks. She has been using a rollator for approximately 10 years to help with her balance. She has had approximatlely 3 falls over the last 12 months. All of the falls have occurred when she was not using her walker.    Limitations Walking   Patient Stated Goals Improve balance and strength with transfers   Currently in Pain? Yes   Pain Score 9    Pain Location Leg   Pain Orientation Right;Left   Pain Descriptors / Indicators Sore   Pain Type Chronic pain   Pain Onset More than a month ago   Pain Frequency Constant            [facility] PT Assessment - 08/15/16 1459      Observation/Other Assessments   Activities of Balance Confidence Scale (ABC Scale)  55%     Standardized Balance Assessment   Five times sit to stand comments  18sec   10 Meter Walk .71sec     Berg Balance Test   Sit to Stand Able to stand using hands after several tries   Standing Unsupported Able to stand 2 minutes with supervision   Sitting with Back Unsupported but Feet Supported on Floor or Stool Able to sit safely and securely 2 minutes  Stand to Sit Controls descent by using hands   Transfers Able to transfer with verbal cueing and /or supervision   Standing Unsupported with Eyes Closed Able to stand 3 seconds   Standing Ubsupported with Feet Together Needs help to attain position but able to stand for 30 seconds with feet together   From Standing, Reach Forward with Outstretched Arm Reaches forward but needs supervision   From Standing Position, Pick up Object from Floor Able to pick up shoe, needs supervision   From Standing Position, Turn to Look Behind Over each Shoulder Needs supervision when turning   Turn 360 Degrees Able to turn 360 degrees safely but slowly   Standing Unsupported, Alternately Place Feet on Step/Stool Needs assistance to keep from falling or unable to try   Standing Unsupported, One Foot in [employer] balance while stepping or standing    Standing on One Leg Unable to try or needs assist to prevent fall   Total Score 24     Timed Up and Go Test   Normal TUG (seconds) 20.2        TREATMENT:  Sit to stands - 2 x 10 with cueing on foot positioning and speed Nustep level 3 for 6 min for cueing on speed of movement Standing Hip Abduction with focus on maintaining proper trunk positioning - x 15 B LEs Ambulation - 4 x 23ft with cueing on speed and performance of exercise Feet together balance with EO/EC - 30sec x 2 with patient requiring intermittent HHA when performing EC 360 turns in standing for balance        PT Education - 08/15/16 1449    Education provided Yes   Education Details Form/technique with exercise performance   Person(s) Educated Patient   Methods Explanation;Demonstration   Comprehension Verbalized understanding;Returned demonstration             PT Long Term Goals - 08/15/16 1531      PT LONG TERM GOAL #1   Title Pt will be independent with HEP in order to improve strength and balance in order to decrease fall risk and improve function at home and work   Baseline 08/15/16: Moderate cueing on form/technique   Time 6   Period Weeks   Status On-going     PT LONG TERM GOAL #2   Title Pt will improve ABC by at least 13% in order to demonstrate clinically significant improvement in balance confidence.    Baseline 06/17/16: 43.75% 08/15/16: 55%   Time 6   Period Weeks   Status On-going     PT LONG TERM GOAL #3   Title Pt will improve BERG to >30 points in order to demonstrate clinically significant improvement in balance.     Baseline 06/17/16: 17/56; 08/15/16: 24/56   Time 6   Period Weeks   Status On-going     PT LONG TERM GOAL #4   Title Pt will decrease TUG by at least seconds in order to demonstrate decreased fall risk    Baseline 06/17/16: 22.7 seconds; 08/15/16: 20.3sec   Time 6   Period Weeks   Status On-going     PT LONG TERM GOAL #5   Title Pt will decrease 5TSTS to <16  seconds in a standard chair in order to demonstrate clinically significant improvement in LE strength   Baseline 06/17/16: 40 seconds; 08/15/2016: 18sec from raised table   Time 8   Period Weeks   Status On-going  Plan - 08/20/2016 1527    Clinical Impression Statement Patient demonstrates significant improvement in 5xsts, ABC and BERG outcome measures indicating improvement in fall risk and LE functional strength. Although patient is improving, he continues to demonstrate decreased LE strength/endurance and dynamic/static balance as indicated by decreased  scores on functional outcomes and patient will benefit from further skilled therapy to return to prior level of function.    Rehab Potential Fair   Clinical Impairments Affecting Rehab Potential Positive: motivation; Negative: age, chronicity, bilateral knee pain   PT Frequency 2x / week   PT Duration 8 weeks   PT Treatment/Interventions ADLs/Self Care Home Management;Aquatic Therapy;Canalith Repostioning;Cryotherapy;Electrical Stimulation;Iontophoresis 4mg /ml Dexamethasone;Moist Heat;Traction;Ultrasound;DME Instruction;Gait training;Stair training;Functional mobility training;Therapeutic activities;Therapeutic exercise;Balance training;Neuromuscular re-education;Patient/family education;Wheelchair mobility training;Manual techniques;Passive range of motion;Vestibular   PT Next Visit Plan Progress balance and strengthening. Plan for transition to main clinic in order to utilize parallel bars   PT Home Exercise Plan Seated/supine glut sets, seated adduction squeezes, seated resisted abduction and/or clams, seated/supine hip flexion marching   Consulted and Agree with Plan of Care Patient;Family member/caregiver   Family Member Consulted Daughter      Patient will benefit from skilled therapeutic intervention in order to improve the following deficits and impairments:  Abnormal gait, Decreased balance, Decreased strength,  Difficulty walking, Obesity  Visit Diagnosis: Muscle weakness (generalized)  Difficulty in walking, not elsewhere classified  Unsteadiness on feet       G-Codes - Aug 20, 2016 1536    Functional Assessment Tool Used clinical judgement, 75m gait speed, 5TSTS, BERG, TUG, ABC, LEFS   Functional Limitation Mobility: Walking and moving around   Mobility: Walking and Moving Around Current Status [phone]) At least 40 percent but less than 60 percent impaired, limited or restricted   Mobility: Walking and Moving Around Goal Status [phone]) At least 40 percent but less than 60 percent impaired, limited or restricted      Problem List Patient Active Problem List   Diagnosis Date Noted  . Hemorrhoids 05/17/2016  . Obesity (BMI 30-39.9) 04/26/2016  . Multiple lung nodules 04/19/2016  . Chest pain 04/17/2016  . Partial symptomatic epilepsy with complex partial seizures, not intractable, without status epilepticus ([place]) 06/30/2015  . Bronchiectasis without complication ([place]) [id number]  . Imbalance 11/08/2014  . Essential (primary) hypertension 11/08/2014  . Personal history of malignant neoplasm of breast 11/08/2014  . History of malignant carcinoid tumor of bronchus and lung 11/08/2014  . H/O peptic ulcer 11/08/2014  . Asthma, mild intermittent 11/08/2014  . PE (pulmonary embolism) 11/08/2014  . Gonalgia 11/08/2014  . Leg varices 11/08/2014    Blythe Stanford, PT DPT 2016/08/20, 3:42 PM  [place] MAIN [facility] SERVICES [address] [place], [place], [address] Phone: [phone]   Fax:  [phone]  Name: Rebecca Wolf MRN: [id number] Date of Birth: 1924-07-07

## 2016-08-20 ENCOUNTER — Other Ambulatory Visit: Payer: Self-pay | Admitting: Gastroenterology

## 2016-08-20 ENCOUNTER — Ambulatory Visit: Payer: Medicare Other

## 2016-08-20 ENCOUNTER — Other Ambulatory Visit: Payer: Self-pay

## 2016-08-20 MED ORDER — SUCRALFATE 1 G PO TABS: 1.0000 g | ORAL_TABLET | Freq: Three times a day (TID) | ORAL | 2 refills | Status: DC

## 2016-08-27 ENCOUNTER — Ambulatory Visit: Payer: Medicare Other

## 2016-08-27 DIAGNOSIS — R2681 Unsteadiness on feet: Secondary | ICD-10-CM | POA: Diagnosis not present

## 2016-08-27 DIAGNOSIS — M6281 Muscle weakness (generalized): Secondary | ICD-10-CM | POA: Diagnosis not present

## 2016-08-27 DIAGNOSIS — R262 Difficulty in walking, not elsewhere classified: Secondary | ICD-10-CM

## 2016-08-27 DIAGNOSIS — Z9181 History of falling: Secondary | ICD-10-CM | POA: Diagnosis not present

## 2016-08-27 NOTE — Therapy (Signed)
[place] MAIN [facility] SERVICES [address] [place], [place], [address] Phone: [phone]   Fax:  [phone]  Physical Therapy Treatment  Patient Details  Name: Rebecca Wolf MRN: [id number] Date of Birth: [birth year] Referring Provider: Oneita Kras  Encounter Date: 08/27/2016      PT End of Session - 08/27/16 1551    Visit Number 11   Number of Visits 17   Date for PT Re-Evaluation 09/30/16   Authorization Type g codes 1/10   PT Start Time 1515   PT Stop Time 1600   PT Time Calculation (min) 45 min   Equipment Utilized During Treatment Gait belt   Activity Tolerance Patient tolerated treatment well   Behavior During Therapy [facility] for tasks assessed/performed      Past Medical History:  Diagnosis Date  . Asthma   . Breast cancer ([place])    2003 and 2004, radiation and chemo for 2004 breast cancer  . 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  . GERD (gastroesophageal reflux disease)   . Hemorrhoids   . Hypertension   . Polyp of colon 2002   bleeding polyp of right ascending colon  . PUD (peptic ulcer disease)   . Seizures ([place])    epilepsy well controlled    Past Surgical History:  Procedure Laterality Date  . BREAST EXCISIONAL BIOPSY Left 2003   positive  . BREAST EXCISIONAL BIOPSY Left 2004   positive  . BREAST LUMPECTOMY Left 2004/2005   cancer  . HEMICOLECTOMY Right    bowel obstruction  . HEMORRHOID SURGERY    . THORACOTOMY/LOBECTOMY  1999   carcinoid tumor  . TOTAL VAGINAL HYSTERECTOMY    . VARICOSE VEIN SURGERY      There were no vitals filed for this visit.      Subjective Assessment - 08/27/16 1541    Subjective Patient reports her legs are less sore today states she is feeling stronger since her last visit.    Patient is accompained by: --  Daughter   Pertinent History Pt reports that she has difficulty performing sit to stand from chairs and trouble with her balance. She also reports that her  knees have been "locking" up on her over the last couple weeks. She has been using a rollator for approximately 10 years to help with her balance. She has had approximatlely 3 falls over the last 12 months. All of the falls have occurred when she was not using her walker.    Limitations Walking   Patient Stated Goals Improve balance and strength with transfers   Currently in Pain? Yes   Pain Score 3    Pain Location Leg   Pain Orientation Right;Left   Pain Descriptors / Indicators Sore   Pain Type Chronic pain   Pain Onset More than a month ago       TREATMENT:  Leg press 105# --3 x 10 with cueing on speed and joint positioning Nustep level 3 for 6 min for cueing on speed of movement Side stepping up and over blue airex - x 8 with UE use for support Forward step ups onto airex pad - x10 B Standing marches with UE support - 2 x 15 Standing Hip Abduction with focus on maintaining proper trunk positioning -2 x 15 B LEs Feet apart balance with EO on blue half foam roller - 1 min       PT Education - 08/27/16 1550    Education provided Yes  Education Details form/technique with exercises   Person(s) Educated Patient   Methods Explanation;Demonstration   Comprehension Verbalized understanding;Returned demonstration             PT Long Term Goals - 08/15/16 1531      PT LONG TERM GOAL #1   Title Pt will be independent with HEP in order to improve strength and balance in order to decrease fall risk and improve function at home and work   Baseline 08/15/16: Moderate cueing on form/technique   Time 6   Period Weeks   Status On-going     PT LONG TERM GOAL #2   Title Pt will improve ABC by at least 13% in order to demonstrate clinically significant improvement in balance confidence.    Baseline 06/17/16: 43.75% 08/15/16: 55%   Time 6   Period Weeks   Status On-going     PT LONG TERM GOAL #3   Title Pt will improve BERG to >30 points in order to demonstrate clinically  significant improvement in balance.     Baseline 06/17/16: 17/56; 08/15/16: 24/56   Time 6   Period Weeks   Status On-going     PT LONG TERM GOAL #4   Title Pt will decrease TUG by at least seconds in order to demonstrate decreased fall risk    Baseline 06/17/16: 22.7 seconds; 08/15/16: 20.3sec   Time 6   Period Weeks   Status On-going     PT LONG TERM GOAL #5   Title Pt will decrease 5TSTS to <16 seconds in a standard chair in order to demonstrate clinically significant improvement in LE strength   Baseline 06/17/16: 40 seconds; 08/15/2016: 18sec from raised table   Time 8   Period Weeks   Status On-going               Plan - 08/27/16 1554    Clinical Impression Statement Patient demonstrates improvement in LE strength today requiring less sitting rest breaks throughout session. Patient continues to demonstrate increased postural sway with exercise and patient will benefit from further skilled therapy to return to prior level of function to return to prior level of function.     Rehab Potential Fair   Clinical Impairments Affecting Rehab Potential Positive: motivation; Negative: age, chronicity, bilateral knee pain   PT Frequency 2x / week   PT Duration 8 weeks   PT Treatment/Interventions ADLs/Self Care Home Management;Aquatic Therapy;Canalith Repostioning;Cryotherapy;Electrical Stimulation;Iontophoresis 4mg /ml Dexamethasone;Moist Heat;Traction;Ultrasound;DME Instruction;Gait training;Stair training;Functional mobility training;Therapeutic activities;Therapeutic exercise;Balance training;Neuromuscular re-education;Patient/family education;Wheelchair mobility training;Manual techniques;Passive range of motion;Vestibular   PT Next Visit Plan Progress balance and strengthening. Plan for transition to main clinic in order to utilize parallel bars   PT Home Exercise Plan Seated/supine glut sets, seated adduction squeezes, seated resisted abduction and/or clams, seated/supine hip  flexion marching   Consulted and Agree with Plan of Care Patient;Family member/caregiver   Family Member Consulted Daughter      Patient will benefit from skilled therapeutic intervention in order to improve the following deficits and impairments:  Abnormal gait, Decreased balance, Decreased strength, Difficulty walking, Obesity  Visit Diagnosis: Difficulty in walking, not elsewhere classified  Muscle weakness (generalized)  Unsteadiness on feet  History of falling     Problem List Patient Active Problem List   Diagnosis Date Noted  . Hemorrhoids 05/17/2016  . Obesity (BMI 30-39.9) 04/26/2016  . Multiple lung nodules 04/19/2016  . Chest pain 04/17/2016  . Partial symptomatic epilepsy with complex partial seizures, not intractable, without status epilepticus ([place])  06/30/2015  . Bronchiectasis without complication ([place]) [id number]  . Imbalance 11/08/2014  . Essential (primary) hypertension 11/08/2014  . Personal history of malignant neoplasm of breast 11/08/2014  . History of malignant carcinoid tumor of bronchus and lung 11/08/2014  . H/O peptic ulcer 11/08/2014  . Asthma, mild intermittent 11/08/2014  . PE (pulmonary embolism) 11/08/2014  . Gonalgia 11/08/2014  . Leg varices 11/08/2014    Blythe Stanford, PT DPT 08/27/2016, 4:02 PM  [place] MAIN [facility] SERVICES [address] [place], [place], [address] Phone: [phone]   Fax:  [phone]  Name: AZAYLEE KIJEK MRN: [id number] Date of Birth: Mar 08, 1925

## 2016-08-29 ENCOUNTER — Ambulatory Visit: Payer: Medicare Other | Attending: Internal Medicine

## 2016-08-29 DIAGNOSIS — R262 Difficulty in walking, not elsewhere classified: Secondary | ICD-10-CM | POA: Diagnosis not present

## 2016-08-29 DIAGNOSIS — Z9181 History of falling: Secondary | ICD-10-CM | POA: Insufficient documentation

## 2016-08-29 DIAGNOSIS — R2681 Unsteadiness on feet: Secondary | ICD-10-CM | POA: Diagnosis not present

## 2016-08-29 DIAGNOSIS — M6281 Muscle weakness (generalized): Secondary | ICD-10-CM | POA: Insufficient documentation

## 2016-08-29 NOTE — Therapy (Signed)
[place] MAIN [facility] SERVICES [address] [place], [place], [address] Phone: [phone]   Fax:  [phone]  Physical Therapy Treatment  Patient Details  Name: Rebecca Wolf MRN: [id number] Date of Birth: 1925-03-15 Referring Provider: Oneita Kras  Encounter Date: 08/29/2016      PT End of Session - 08/29/16 1643    Visit Number 12   Number of Visits 17   Date for PT Re-Evaluation 2016-10-04   Authorization Type g codes Aug 18, 2022   PT Start Time 1529   PT Stop Time 1600   PT Time Calculation (min) 31 min   Activity Tolerance Patient tolerated treatment well   Behavior During Therapy [facility] for tasks assessed/performed      Past Medical History:  Diagnosis Date  . Asthma   . Breast cancer ([place])    2003 and 2004, radiation and chemo for 2004 breast cancer  . 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  . GERD (gastroesophageal reflux disease)   . Hemorrhoids   . Hypertension   . Polyp of colon 2002   bleeding polyp of right ascending colon  . PUD (peptic ulcer disease)   . Seizures ([place])    epilepsy well controlled    Past Surgical History:  Procedure Laterality Date  . BREAST EXCISIONAL BIOPSY Left 2003   positive  . BREAST EXCISIONAL BIOPSY Left 2004   positive  . BREAST LUMPECTOMY Left 2004/2005   cancer  . HEMICOLECTOMY Right    bowel obstruction  . HEMORRHOID SURGERY    . THORACOTOMY/LOBECTOMY  1999   carcinoid tumor  . TOTAL VAGINAL HYSTERECTOMY    . VARICOSE VEIN SURGERY      There were no vitals filed for this visit.      Subjective Assessment - 08/29/16 1633    Subjective Patient returns and reports increased L LE pain extending from her anterior knee down into her medial aspect of her ankle. Patient reports insidious onset when waking on Wednesday morning.   Patient is accompained by: --  Daughter   Pertinent History Pt reports that she has difficulty performing sit to stand from chairs and trouble with  her balance. She also reports that her knees have been "locking" up on her over the last couple weeks. She has been using a rollator for approximately 10 years to help with her balance. She has had approximatlely 3 falls over the last 12 months. All of the falls have occurred when she was not using her walker.    Limitations Walking   Patient Stated Goals Improve balance and strength with transfers   Currently in Pain? Yes   Pain Score 8    Pain Location Leg  Anterior knee extending into medial ankle   Pain Orientation Left   Pain Descriptors / Indicators Sore   Pain Type Acute pain   Pain Onset More than a month ago      TREATMENT: Manual therapy: Light STM along medial aspect of the L knee, lower leg, and ankle to decrease increased spasms and decrease pain. Effleurage performed on the L lower extremity to decrease pain and spasms extending into the knee.  Therapeutic Exercise: Inversion/eversion on the left LE -- 3 x 20 reppetions to improve mobility and decrease pain LAQ in sitting on the left LE -- x20 to improve LE mobility and decrease pain Ankle pumps in sitting -- x20 in sitting with cueing on performing throughout full ROM Resisted Inv on L LE -- 3  sec hold x 5 -- increased pain noted during motion  Modalities: With patient positioned in sitting 1 large ice pack placed along medial aspect of the knee and onto the L lower leg for 6 min. Checked skin and no adverse signs noted after ice pack is removed. Patient reports decreased pain after ice is placed.          PT Education - 08/29/16 1642    Education provided Yes   Education Details Educated to use ice at home to improve increased pain   Person(s) Educated Patient   Methods Explanation;Demonstration   Comprehension Verbalized understanding;Returned demonstration             PT Long Term Goals - 08/15/16 1531      PT LONG TERM GOAL #1   Title Pt will be independent with HEP in order to improve strength and  balance in order to decrease fall risk and improve function at home and work   Baseline 08/15/16: Moderate cueing on form/technique   Time 6   Period Weeks   Status On-going     PT LONG TERM GOAL #2   Title Pt will improve ABC by at least 13% in order to demonstrate clinically significant improvement in balance confidence.    Baseline 06/17/16: 43.75% 08/15/16: 55%   Time 6   Period Weeks   Status On-going     PT LONG TERM GOAL #3   Title Pt will improve BERG to >30 points in order to demonstrate clinically significant improvement in balance.     Baseline 06/17/16: 17/56; 08/15/16: 24/56   Time 6   Period Weeks   Status On-going     PT LONG TERM GOAL #4   Title Pt will decrease TUG by at least seconds in order to demonstrate decreased fall risk    Baseline 06/17/16: 22.7 seconds; 08/15/16: 20.3sec   Time 6   Period Weeks   Status On-going     PT LONG TERM GOAL #5   Title Pt will decrease 5TSTS to <16 seconds in a standard chair in order to demonstrate clinically significant improvement in LE strength   Baseline 06/17/16: 40 seconds; 08/15/2016: 18sec from raised table   Time 8   Period Weeks   Status On-going               Plan - 08/29/16 1643    Clinical Impression Statement Patient demonstrates increased L LE pain along anterior aspect of the knee extending down into the medial ankle. Patient describes pain as a "soreness" and denies after sharp pain; there is no tenderness along the calf and no noteable swelling. Patient's L LE is tender to touch around the knee and along the medial aspect of the lower leg. Patient describes increased pain when performing ankle inversion and denies pain with any knee extension movement. Educated patient to ice her L LE around the areas which are sore and to monitor symptoms over the weekend.     Rehab Potential Fair   Clinical Impairments Affecting Rehab Potential Positive: motivation; Negative: age, chronicity, bilateral knee pain   PT  Frequency 2x / week   PT Duration 8 weeks   PT Treatment/Interventions ADLs/Self Care Home Management;Aquatic Therapy;Canalith Repostioning;Cryotherapy;Electrical Stimulation;Iontophoresis 4mg /ml Dexamethasone;Moist Heat;Traction;Ultrasound;DME Instruction;Gait training;Stair training;Functional mobility training;Therapeutic activities;Therapeutic exercise;Balance training;Neuromuscular re-education;Patient/family education;Wheelchair mobility training;Manual techniques;Passive range of motion;Vestibular   PT Next Visit Plan Progress balance and strengthening. Plan for transition to main clinic in order to utilize parallel bars   PT Home Exercise  Plan Seated/supine glut sets, seated adduction squeezes, seated resisted abduction and/or clams, seated/supine hip flexion marching   Consulted and Agree with Plan of Care Patient;Family member/caregiver   Family Member Consulted Daughter      Patient will benefit from skilled therapeutic intervention in order to improve the following deficits and impairments:  Abnormal gait, Decreased balance, Decreased strength, Difficulty walking, Obesity  Visit Diagnosis: Difficulty in walking, not elsewhere classified  Muscle weakness (generalized)  Unsteadiness on feet     Problem List Patient Active Problem List   Diagnosis Date Noted  . Hemorrhoids 05/17/2016  . Obesity (BMI 30-39.9) 04/26/2016  . Multiple lung nodules 04/19/2016  . Chest pain 04/17/2016  . Partial symptomatic epilepsy with complex partial seizures, not intractable, without status epilepticus ([place]) 06/30/2015  . Bronchiectasis without complication ([place]) [id number]  . Imbalance 11/08/2014  . Essential (primary) hypertension 11/08/2014  . Personal history of malignant neoplasm of breast 11/08/2014  . History of malignant carcinoid tumor of bronchus and lung 11/08/2014  . H/O peptic ulcer 11/08/2014  . Asthma, mild intermittent 11/08/2014  . PE (pulmonary embolism) 11/08/2014  .  Gonalgia 11/08/2014  . Leg varices 11/08/2014    Blythe Stanford, PT DPT 08/29/2016, 4:52 PM  [place] MAIN [facility] SERVICES [address] [place], [place], [address] Phone: [phone]   Fax:  [phone]  Name: MARQUELLE AMON MRN: [id number] Date of Birth: Feb 06, 1925

## 2016-09-03 ENCOUNTER — Ambulatory Visit: Payer: Medicare Other

## 2016-09-03 DIAGNOSIS — R2681 Unsteadiness on feet: Secondary | ICD-10-CM

## 2016-09-03 DIAGNOSIS — R262 Difficulty in walking, not elsewhere classified: Secondary | ICD-10-CM

## 2016-09-03 DIAGNOSIS — M6281 Muscle weakness (generalized): Secondary | ICD-10-CM

## 2016-09-03 DIAGNOSIS — Z9181 History of falling: Secondary | ICD-10-CM | POA: Diagnosis not present

## 2016-09-03 NOTE — Therapy (Signed)
[place] MAIN [facility] SERVICES [address] [place], [place], [address] Phone: [phone]   Fax:  [phone]  Physical Therapy Treatment  Patient Details  Name: Rebecca Wolf MRN: [id number] Date of Birth: 05-26-1925 Referring Provider: Oneita Kras  Encounter Date: 09/03/2016      PT End of Session - 09/03/16 1412    Visit Number 13   Number of Visits 17   Date for PT Re-Evaluation 10-25-16   Authorization Type g codes 07-Oct-2022   PT Start Time 1305/10/27   PT Stop Time 1346   PT Time Calculation (min) 39 min   Activity Tolerance Patient tolerated treatment well   Behavior During Therapy [facility] for tasks assessed/performed      Past Medical History:  Diagnosis Date  . Asthma   . Breast cancer ([place])    2001-10-27 and 2002/10/28, radiation and chemo for October 28, 2002 breast cancer  . Carcinoid tumor determined by biopsy of lung 10-28-1999  . GERD (gastroesophageal reflux disease)   . Hemorrhoids   . Hypertension   . Polyp of colon 27-Oct-2000   bleeding polyp of right ascending colon  . PUD (peptic ulcer disease)   . Seizures ([place])    epilepsy well controlled    Past Surgical History:  Procedure Laterality Date  . BREAST EXCISIONAL BIOPSY Left October 27, 2001   positive  . BREAST EXCISIONAL BIOPSY Left 2002-10-28   positive  . BREAST LUMPECTOMY Left 2004/2005   cancer  . HEMICOLECTOMY Right    bowel obstruction  . HEMORRHOID SURGERY    . THORACOTOMY/LOBECTOMY  1999   carcinoid tumor  . TOTAL VAGINAL HYSTERECTOMY    . VARICOSE VEIN SURGERY      There were no vitals filed for this visit.      Subjective Assessment - 09/03/16 1351    Subjective Patient reports her leg pain as much improved over the past two days.    Patient is accompained by: --  Daughter   Pertinent History Pt reports that she has difficulty performing sit to stand from chairs and trouble with her balance. She also reports that her knees have been "locking" up on her over the last couple weeks. She has been  using a rollator for approximately 10 years to help with her balance. She has had approximatlely 3 falls over the last 12 months. All of the falls have occurred when she was not using her walker.    Limitations Walking   Patient Stated Goals Improve balance and strength with transfers   Currently in Pain? Yes   Pain Score 2    Pain Location Leg   Pain Orientation Left   Pain Descriptors / Indicators Sore   Pain Type Acute pain   Pain Onset More than a month ago   Pain Frequency Constant         TREATMENT:  Sit to stand - x 5 with mina from PT for forward trunk lean (Performed from higher height table) Hip adduction with 4# weight - x20 Nustep with cueing on speed and to perform throughout full AROM - 5min to decrease knee pain LAQ with SLR at end range in sitting - 2 x 15 B to improve LE strength Hamstring Curls in sitting with GTB - 2 x 10 B  Patient demonstrates less knee pain at the end of therapy.        PT Education - 09/03/16 1412    Education provided Yes   Education Details Educated to continue ambulating at  home    Person(s) Educated Patient   Methods Explanation;Demonstration   Comprehension Verbalized understanding;Returned demonstration             PT Long Term Goals - 08/15/16 1531      PT LONG TERM GOAL #1   Title Pt will be independent with HEP in order to improve strength and balance in order to decrease fall risk and improve function at home and work   Baseline 08/15/16: Moderate cueing on form/technique   Time 6   Period Weeks   Status On-going     PT LONG TERM GOAL #2   Title Pt will improve ABC by at least 13% in order to demonstrate clinically significant improvement in balance confidence.    Baseline 06/17/16: 43.75% 08/15/16: 55%   Time 6   Period Weeks   Status On-going     PT LONG TERM GOAL #3   Title Pt will improve BERG to >30 points in order to demonstrate clinically significant improvement in balance.     Baseline 06/17/16: 17/56;  08/15/16: 24/56   Time 6   Period Weeks   Status On-going     PT LONG TERM GOAL #4   Title Pt will decrease TUG by at least seconds in order to demonstrate decreased fall risk    Baseline 06/17/16: 22.7 seconds; 08/15/16: 20.3sec   Time 6   Period Weeks   Status On-going     PT LONG TERM GOAL #5   Title Pt will decrease 5TSTS to <16 seconds in a standard chair in order to demonstrate clinically significant improvement in LE strength   Baseline 06/17/16: 40 seconds; 08/15/2016: 18sec from raised table   Time 8   Period Weeks   Status On-going               Plan - 09/03/16 1414    Clinical Impression Statement Patient demonstrates decreased pain in the L LE today and performed more strengthening exercise secondary to improvement in LE pain. Patient demonstrates increased B knee pain upon returning to the clinic with improved after performed knee ROM exercises indicating improved motor coordination. Patient will benefit from further skillled therapy to return to prior level of function.    Rehab Potential Fair   Clinical Impairments Affecting Rehab Potential Positive: motivation; Negative: age, chronicity, bilateral knee pain   PT Frequency 2x / week   PT Duration 8 weeks   PT Treatment/Interventions ADLs/Self Care Home Management;Aquatic Therapy;Canalith Repostioning;Cryotherapy;Electrical Stimulation;Iontophoresis 4mg /ml Dexamethasone;Moist Heat;Traction;Ultrasound;DME Instruction;Gait training;Stair training;Functional mobility training;Therapeutic activities;Therapeutic exercise;Balance training;Neuromuscular re-education;Patient/family education;Wheelchair mobility training;Manual techniques;Passive range of motion;Vestibular   PT Next Visit Plan Progress balance and strengthening. Plan for transition to main clinic in order to utilize parallel bars   PT Home Exercise Plan Seated/supine glut sets, seated adduction squeezes, seated resisted abduction and/or clams, seated/supine hip  flexion marching   Consulted and Agree with Plan of Care Patient;Family member/caregiver   Family Member Consulted Daughter      Patient will benefit from skilled therapeutic intervention in order to improve the following deficits and impairments:  Abnormal gait, Decreased balance, Decreased strength, Difficulty walking, Obesity  Visit Diagnosis: Difficulty in walking, not elsewhere classified  Muscle weakness (generalized)  Unsteadiness on feet     Problem List Patient Active Problem List   Diagnosis Date Noted  . Hemorrhoids 05/17/2016  . Obesity (BMI 30-39.9) 04/26/2016  . Multiple lung nodules 04/19/2016  . Chest pain 04/17/2016  . Partial symptomatic epilepsy with complex partial seizures, not intractable, without  status epilepticus ([place]) 06/30/2015  . Bronchiectasis without complication ([place]) [id number]  . Imbalance 11/08/2014  . Essential (primary) hypertension 11/08/2014  . Personal history of malignant neoplasm of breast 11/08/2014  . History of malignant carcinoid tumor of bronchus and lung 11/08/2014  . H/O peptic ulcer 11/08/2014  . Asthma, mild intermittent 11/08/2014  . PE (pulmonary embolism) 11/08/2014  . Gonalgia 11/08/2014  . Leg varices 11/08/2014    Blythe Stanford, PT DPT 09/03/2016, 2:19 PM  [place] MAIN [facility] SERVICES [address] [place], [place], [address] Phone: [phone]   Fax:  [phone]  Name: Rebecca Wolf MRN: [id number] Date of Birth: 1924/11/20

## 2016-09-10 ENCOUNTER — Other Ambulatory Visit: Payer: Self-pay | Admitting: Hematology and Oncology

## 2016-09-10 ENCOUNTER — Ambulatory Visit: Payer: Medicare Other

## 2016-09-10 DIAGNOSIS — R2681 Unsteadiness on feet: Secondary | ICD-10-CM | POA: Diagnosis not present

## 2016-09-10 DIAGNOSIS — Z1231 Encounter for screening mammogram for malignant neoplasm of breast: Secondary | ICD-10-CM

## 2016-09-10 DIAGNOSIS — R262 Difficulty in walking, not elsewhere classified: Secondary | ICD-10-CM | POA: Diagnosis not present

## 2016-09-10 DIAGNOSIS — M6281 Muscle weakness (generalized): Secondary | ICD-10-CM | POA: Diagnosis not present

## 2016-09-10 DIAGNOSIS — Z9181 History of falling: Secondary | ICD-10-CM | POA: Diagnosis not present

## 2016-09-10 NOTE — Therapy (Signed)
[place] MAIN [facility] SERVICES [address] [place], [place], [address] Phone: [phone]   Fax:  [phone]  Physical Therapy Treatment  Patient Details  Name: SONIYA ASHRAF MRN: [id number] Date of Birth: 1925-05-13 Referring Provider: Oneita Kras  Encounter Date: 09/10/2016      PT End of Session - 09/10/16 1110    Visit Number 14   Number of Visits 17   Date for PT Re-Evaluation 10/24/2016   Authorization Type g codes 11/06/2022   PT Start Time Oct 26, 1039   PT Stop Time 1115   PT Time Calculation (min) 34 min   Activity Tolerance Patient tolerated treatment well   Behavior During Therapy [facility] for tasks assessed/performed      Past Medical History:  Diagnosis Date  . Asthma   . Breast cancer ([place])    October 25, 2001 and 10-26-02, radiation and chemo for 10-26-02 breast cancer  . Carcinoid tumor determined by biopsy of lung 10-26-99  . GERD (gastroesophageal reflux disease)   . Hemorrhoids   . Hypertension   . Polyp of colon 2000/10/25   bleeding polyp of right ascending colon  . PUD (peptic ulcer disease)   . Seizures ([place])    epilepsy well controlled    Past Surgical History:  Procedure Laterality Date  . BREAST EXCISIONAL BIOPSY Left 2001/10/25   positive  . BREAST EXCISIONAL BIOPSY Left October 26, 2002   positive  . BREAST LUMPECTOMY Left 2004/2005   cancer  . HEMICOLECTOMY Right    bowel obstruction  . HEMORRHOID SURGERY    . THORACOTOMY/LOBECTOMY  1999   carcinoid tumor  . TOTAL VAGINAL HYSTERECTOMY    . VARICOSE VEIN SURGERY      There were no vitals filed for this visit.      Subjective Assessment - 09/10/16 1059    Subjective Patient reports decreased leg and knee pain today upon entering the clinic. States she has not been walking at home.    Patient is accompained by: --  Daughter   Pertinent History Pt reports that she has difficulty performing sit to stand from chairs and trouble with her balance. She also reports that her knees have been "locking" up on  her over the last couple weeks. She has been using a rollator for approximately 10 years to help with her balance. She has had approximatlely 3 falls over the last 12 months. All of the falls have occurred when she was not using her walker.    Limitations Walking   Patient Stated Goals Improve balance and strength with transfers   Currently in Pain? No/denies   Pain Onset More than a month ago      TREATMENT:  Nustep with cueing on speed and to perform throughout full AROM - 91min to decrease knee pain level 3 Sit to stand - x 10 with mina from PT for forward trunk lean (Performed from higher height table after 4 reps secondary to increased knee) Ball roll outs in sitting for 4# ball under foot - 2 x 20 B Amb forward with walker - 14ft with cueing on speed and improving heel strike Hip adduction with 4# weight - x20 *Patient demonstrates no pain by the end of therapy      PT Education - 09/10/16 1101    Education provided Yes   Education Details Form/technique with exercise   Person(s) Educated Patient   Methods Explanation;Demonstration   Comprehension Verbalized understanding;Returned demonstration  PT Long Term Goals - 08/15/16 1531      PT LONG TERM GOAL #1   Title Pt will be independent with HEP in order to improve strength and balance in order to decrease fall risk and improve function at home and work   Baseline 08/15/16: Moderate cueing on form/technique   Time 6   Period Weeks   Status On-going     PT LONG TERM GOAL #2   Title Pt will improve ABC by at least 13% in order to demonstrate clinically significant improvement in balance confidence.    Baseline 06/17/16: 43.75% 08/15/16: 55%   Time 6   Period Weeks   Status On-going     PT LONG TERM GOAL #3   Title Pt will improve BERG to >30 points in order to demonstrate clinically significant improvement in balance.     Baseline 06/17/16: 17/56; 08/15/16: 24/56   Time 6   Period Weeks   Status  On-going     PT LONG TERM GOAL #4   Title Pt will decrease TUG by at least seconds in order to demonstrate decreased fall risk    Baseline 06/17/16: 22.7 seconds; 08/15/16: 20.3sec   Time 6   Period Weeks   Status On-going     PT LONG TERM GOAL #5   Title Pt will decrease 5TSTS to <16 seconds in a standard chair in order to demonstrate clinically significant improvement in LE strength   Baseline 06/17/16: 40 seconds; 08/15/2016: 18sec from raised table   Time 8   Period Weeks   Status On-going               Plan - 09/10/16 1151    Clinical Impression Statement Patient demonstrates decreased leg/knee pain today thus we addressed more standing exercises to improve LE strengthening. Patient required frequent sitting rest breaks to perform exercises and patient will benefit from further skilled therapy to return to prior level of function.    Rehab Potential Fair   Clinical Impairments Affecting Rehab Potential Positive: motivation; Negative: age, chronicity, bilateral knee pain   PT Frequency 2x / week   PT Duration 8 weeks   PT Treatment/Interventions ADLs/Self Care Home Management;Aquatic Therapy;Canalith Repostioning;Cryotherapy;Electrical Stimulation;Iontophoresis 4mg /ml Dexamethasone;Moist Heat;Traction;Ultrasound;DME Instruction;Gait training;Stair training;Functional mobility training;Therapeutic activities;Therapeutic exercise;Balance training;Neuromuscular re-education;Patient/family education;Wheelchair mobility training;Manual techniques;Passive range of motion;Vestibular   PT Next Visit Plan Progress balance and strengthening. Plan for transition to main clinic in order to utilize parallel bars   PT Home Exercise Plan Seated/supine glut sets, seated adduction squeezes, seated resisted abduction and/or clams, seated/supine hip flexion marching   Consulted and Agree with Plan of Care Patient;Family member/caregiver   Family Member Consulted Daughter      Patient will  benefit from skilled therapeutic intervention in order to improve the following deficits and impairments:  Abnormal gait, Decreased balance, Decreased strength, Difficulty walking, Obesity  Visit Diagnosis: Difficulty in walking, not elsewhere classified  Muscle weakness (generalized)  Unsteadiness on feet     Problem List Patient Active Problem List   Diagnosis Date Noted  . Hemorrhoids 05/17/2016  . Obesity (BMI 30-39.9) 04/26/2016  . Multiple lung nodules 04/19/2016  . Chest pain 04/17/2016  . Partial symptomatic epilepsy with complex partial seizures, not intractable, without status epilepticus ([place]) 06/30/2015  . Bronchiectasis without complication ([place]) 78/46/9629  . Imbalance 11/08/2014  . Essential (primary) hypertension 11/08/2014  . Personal history of malignant neoplasm of breast 11/08/2014  . History of malignant carcinoid tumor of bronchus and lung 11/08/2014  .  H/O peptic ulcer 11/08/2014  . Asthma, mild intermittent 11/08/2014  . PE (pulmonary embolism) 11/08/2014  . Gonalgia 11/08/2014  . Leg varices 11/08/2014    Blythe Stanford, PT DPT 09/10/2016, 12:08 PM  [place] MAIN [facility] SERVICES [address] [place], [place], [address] Phone: [phone]   Fax:  [phone]  Name: WENONAH MILO MRN: [id number] Date of Birth: 09-May-1925

## 2016-09-11 DIAGNOSIS — J449 Chronic obstructive pulmonary disease, unspecified: Secondary | ICD-10-CM | POA: Diagnosis not present

## 2016-09-11 DIAGNOSIS — R918 Other nonspecific abnormal finding of lung field: Secondary | ICD-10-CM | POA: Diagnosis not present

## 2016-09-11 DIAGNOSIS — E669 Obesity, unspecified: Secondary | ICD-10-CM | POA: Diagnosis not present

## 2016-09-13 ENCOUNTER — Ambulatory Visit: Payer: Medicare Other

## 2016-09-13 DIAGNOSIS — Z9181 History of falling: Secondary | ICD-10-CM

## 2016-09-13 DIAGNOSIS — R2681 Unsteadiness on feet: Secondary | ICD-10-CM | POA: Diagnosis not present

## 2016-09-13 DIAGNOSIS — R262 Difficulty in walking, not elsewhere classified: Secondary | ICD-10-CM | POA: Diagnosis not present

## 2016-09-13 DIAGNOSIS — M6281 Muscle weakness (generalized): Secondary | ICD-10-CM | POA: Diagnosis not present

## 2016-09-13 NOTE — Therapy (Signed)
[place] MAIN [facility] SERVICES [address] [place], [place], [address] Phone: [phone]   Fax:  [phone]  Physical Therapy Treatment  Patient Details  Name: Rebecca Wolf MRN: [id number] Date of Birth: 02/16/1925 Referring Provider: Oneita Kras  Encounter Date: 09/13/2016      PT End of Session - 09/13/16 1014    Visit Number 15   Number of Visits 17   Date for PT Re-Evaluation 10/26/2016   Authorization Type g codes 5/10   PT Start Time 0945   PT Stop Time 1015   PT Time Calculation (min) 30 min   Activity Tolerance Patient tolerated treatment well   Behavior During Therapy [facility] for tasks assessed/performed      Past Medical History:  Diagnosis Date  . Asthma   . Breast cancer ([place])    2003 and 2004, radiation and chemo for 2004 breast cancer  . 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  . GERD (gastroesophageal reflux disease)   . Hemorrhoids   . Hypertension   . Polyp of colon 2002   bleeding polyp of right ascending colon  . PUD (peptic ulcer disease)   . Seizures ([place])    epilepsy well controlled    Past Surgical History:  Procedure Laterality Date  . BREAST EXCISIONAL BIOPSY Left 2003   positive  . BREAST EXCISIONAL BIOPSY Left 2004   positive  . BREAST LUMPECTOMY Left 2004/2005   cancer  . HEMICOLECTOMY Right    bowel obstruction  . HEMORRHOID SURGERY    . THORACOTOMY/LOBECTOMY  1999   carcinoid tumor  . TOTAL VAGINAL HYSTERECTOMY    . VARICOSE VEIN SURGERY      There were no vitals filed for this visit.      Subjective Assessment - 09/13/16 1000    Subjective Patient reports no increased knee pain upon returning to the clinic today. Patient reports no major changes since the previous visit.    Patient is accompained by: --  Daughter   Pertinent History Pt reports that she has difficulty performing sit to stand from chairs and trouble with her balance. She also reports that her knees have  been "locking" up on her over the last couple weeks. She has been using a rollator for approximately 10 years to help with her balance. She has had approximatlely 3 falls over the last 12 months. All of the falls have occurred when she was not using her walker.    Limitations Walking   Patient Stated Goals Improve balance and strength with transfers   Currently in Pain? No/denies   Pain Onset More than a month ago      TREATMENT:  Nustep with cueing on speed and to perform throughout full AROM - 69min to decrease knee pain level 3 Step ups onto half foam roller - x10 B  Step up and over half foam roller - x10 bilaterally  Hip abduction in standing with UE support - x 10 B Hip adduction/glute squeeze with 4# weight - x20 Side Stepping with UE support - X5 in  bars Standing marches on airex pad - x20 B  *Patient demonstrates no pain by the end of therapy      PT Education - 09/13/16 1014    Education provided Yes   Education Details Form/technique with exercise   Person(s) Educated Patient   Methods Explanation;Demonstration   Comprehension Verbalized understanding;Returned demonstration             PT  Long Term Goals - 08/15/16 1531      PT LONG TERM GOAL #1   Title Pt will be independent with HEP in order to improve strength and balance in order to decrease fall risk and improve function at home and work   Baseline 08/15/16: Moderate cueing on form/technique   Time 6   Period Weeks   Status On-going     PT LONG TERM GOAL #2   Title Pt will improve ABC by at least 13% in order to demonstrate clinically significant improvement in balance confidence.    Baseline 06/17/16: 43.75% 08/15/16: 55%   Time 6   Period Weeks   Status On-going     PT LONG TERM GOAL #3   Title Pt will improve BERG to >30 points in order to demonstrate clinically significant improvement in balance.     Baseline 06/17/16: 17/56; 08/15/16: 24/56   Time 6   Period Weeks   Status On-going     PT  LONG TERM GOAL #4   Title Pt will decrease TUG by at least seconds in order to demonstrate decreased fall risk    Baseline 06/17/16: 22.7 seconds; 08/15/16: 20.3sec   Time 6   Period Weeks   Status On-going     PT LONG TERM GOAL #5   Title Pt will decrease 5TSTS to <16 seconds in a standard chair in order to demonstrate clinically significant improvement in LE strength   Baseline 06/17/16: 40 seconds; 08/15/2016: 18sec from raised table   Time 8   Period Weeks   Status On-going               Plan - 09/13/16 1015    Clinical Impression Statement Performed increased standing balance exercises today to improve standing tolerance and LE strength. Patient demonstrates increased LE weakness today as indicated by increased LE fatigue at end of therapy and patient will benefit from further skilled therapy focused on improving LE strength and standing tolerance. Patient will benefit from further skilled therapy to decrease fall risk .   Rehab Potential Fair   Clinical Impairments Affecting Rehab Potential Positive: motivation; Negative: age, chronicity, bilateral knee pain   PT Frequency 2x / week   PT Duration 8 weeks   PT Treatment/Interventions ADLs/Self Care Home Management;Aquatic Therapy;Canalith Repostioning;Cryotherapy;Electrical Stimulation;Iontophoresis 4mg /ml Dexamethasone;Moist Heat;Traction;Ultrasound;DME Instruction;Gait training;Stair training;Functional mobility training;Therapeutic activities;Therapeutic exercise;Balance training;Neuromuscular re-education;Patient/family education;Wheelchair mobility training;Manual techniques;Passive range of motion;Vestibular   PT Next Visit Plan Progress balance and strengthening. Plan for transition to main clinic in order to utilize parallel bars   PT Home Exercise Plan Seated/supine glut sets, seated adduction squeezes, seated resisted abduction and/or clams, seated/supine hip flexion marching   Consulted and Agree with Plan of Care  Patient;Family member/caregiver   Family Member Consulted Daughter      Patient will benefit from skilled therapeutic intervention in order to improve the following deficits and impairments:  Abnormal gait, Decreased balance, Decreased strength, Difficulty walking, Obesity  Visit Diagnosis: Difficulty in walking, not elsewhere classified  Muscle weakness (generalized)  Unsteadiness on feet  History of falling     Problem List Patient Active Problem List   Diagnosis Date Noted  . Hemorrhoids 05/17/2016  . Obesity (BMI 30-39.9) 04/26/2016  . Multiple lung nodules 04/19/2016  . Chest pain 04/17/2016  . Partial symptomatic epilepsy with complex partial seizures, not intractable, without status epilepticus ([place]) 06/30/2015  . Bronchiectasis without complication ([place]) 73/53/2992  . Imbalance 11/08/2014  . Essential (primary) hypertension 11/08/2014  . Personal history of  malignant neoplasm of breast 11/08/2014  . History of malignant carcinoid tumor of bronchus and lung 11/08/2014  . H/O peptic ulcer 11/08/2014  . Asthma, mild intermittent 11/08/2014  . PE (pulmonary embolism) 11/08/2014  . Gonalgia 11/08/2014  . Leg varices 11/08/2014    Blythe Stanford, PT DPT 09/13/2016, 10:20 AM  [place] MAIN [facility] SERVICES [address] [place], [place], [address] Phone: [phone]   Fax:  [phone]  Name: Rebecca Wolf MRN: [id number] Date of Birth: 31-Oct-1924

## 2016-09-16 ENCOUNTER — Ambulatory Visit: Payer: Medicare Other

## 2016-09-16 DIAGNOSIS — Z9181 History of falling: Secondary | ICD-10-CM | POA: Diagnosis not present

## 2016-09-16 DIAGNOSIS — M6281 Muscle weakness (generalized): Secondary | ICD-10-CM | POA: Diagnosis not present

## 2016-09-16 DIAGNOSIS — R262 Difficulty in walking, not elsewhere classified: Secondary | ICD-10-CM | POA: Diagnosis not present

## 2016-09-16 DIAGNOSIS — R2681 Unsteadiness on feet: Secondary | ICD-10-CM

## 2016-09-16 NOTE — Therapy (Signed)
[place] MAIN [facility] SERVICES [address] [place], [place], [address] Phone: [phone]   Fax:  [phone]  Physical Therapy Treatment  Patient Details  Name: Rebecca Wolf MRN: [id number] Date of Birth: [birth year] Referring Provider: Oneita Kras  Encounter Date: 09/16/2016      PT End of Session - 09/16/16 1318    Visit Number 16   Number of Visits 17   Date for PT Re-Evaluation 2016-10-10   Authorization Type g codes 6/10   PT Start Time 1305/10/11   PT Stop Time 1345   PT Time Calculation (min) 38 min   Activity Tolerance Patient tolerated treatment well   Behavior During Therapy [facility] for tasks assessed/performed      Past Medical History:  Diagnosis Date  . Asthma   . Breast cancer ([place])    2001/10/11 and 2002/10/12, radiation and chemo for 10/12/2002 breast cancer  . Carcinoid tumor determined by biopsy of lung October 12, 1999  . GERD (gastroesophageal reflux disease)   . Hemorrhoids   . Hypertension   . Polyp of colon 10/11/2000   bleeding polyp of right ascending colon  . PUD (peptic ulcer disease)   . Seizures ([place])    epilepsy well controlled    Past Surgical History:  Procedure Laterality Date  . BREAST EXCISIONAL BIOPSY Left 2001-10-11   positive  . BREAST EXCISIONAL BIOPSY Left October 12, 2002   positive  . BREAST LUMPECTOMY Left 2004/2005   cancer  . HEMICOLECTOMY Right    bowel obstruction  . HEMORRHOID SURGERY    . THORACOTOMY/LOBECTOMY  1999   carcinoid tumor  . TOTAL VAGINAL HYSTERECTOMY    . VARICOSE VEIN SURGERY      There were no vitals filed for this visit.      Subjective Assessment - 09/16/16 1313    Subjective Patient reports minimal sorness in her knees and legs today.    Patient is accompained by: --  Daughter   Pertinent History Pt reports that she has difficulty performing sit to stand from chairs and trouble with her balance. She also reports that her knees have been "locking" up on her over the last couple weeks. She has been using a  rollator for approximately 10 years to help with her balance. She has had approximatlely 3 falls over the last 12 months. All of the falls have occurred when she was not using her walker.    Limitations Walking   Patient Stated Goals Improve balance and strength with transfers   Currently in Pain? No/denies   Pain Onset More than a month ago      TREATMENT:  Hip adduction/glute squeeze with 4# weight - x20 Feet apart balance on purple airex pad - 6 x 10 sec with intermittent UE support Side Stepping with intermittent UE support with purple airex - X7 up and over in  bars Nustep with cueing on speed and to perform throughout full AROM - 71min; level 4; 88min: level 3 Step ups onto 6" step with UE support - x5 B  Walking ambulation with cueing on taking larger steps - 74ft, x 129ft        PT Education - 09/16/16 1317    Education provided Yes   Education Details form/technique with exercise performance   Person(s) Educated Patient   Methods Explanation;Demonstration   Comprehension Verbalized understanding;Returned demonstration             PT Long Term Goals - 08/15/16 1531      PT  LONG TERM GOAL #1   Title Pt will be independent with HEP in order to improve strength and balance in order to decrease fall risk and improve function at home and work   Baseline 08/15/16: Moderate cueing on form/technique   Time 6   Period Weeks   Status On-going     PT LONG TERM GOAL #2   Title Pt will improve ABC by at least 13% in order to demonstrate clinically significant improvement in balance confidence.    Baseline 06/17/16: 43.75% 08/15/16: 55%   Time 6   Period Weeks   Status On-going     PT LONG TERM GOAL #3   Title Pt will improve BERG to >30 points in order to demonstrate clinically significant improvement in balance.     Baseline 06/17/16: 17/56; 08/15/16: 24/56   Time 6   Period Weeks   Status On-going     PT LONG TERM GOAL #4   Title Pt will decrease TUG by at least  seconds in order to demonstrate decreased fall risk    Baseline 06/17/16: 22.7 seconds; 08/15/16: 20.3sec   Time 6   Period Weeks   Status On-going     PT LONG TERM GOAL #5   Title Pt will decrease 5TSTS to <16 seconds in a standard chair in order to demonstrate clinically significant improvement in LE strength   Baseline 06/17/16: 40 seconds; 08/15/2016: 18sec from raised table   Time 8   Period Weeks   Status On-going               Plan - 09/16/16 1327    Clinical Impression Statement Patient demonstrates decreased standing tolerance and balance today requiring frequent sitting breaks throughout session and greater UE support to perform. This is most likely due to increased delayed onset muscle soreness and patient will benefit from further skilled therapy focused on improving LE strength and balance to return to prior level of function.    Rehab Potential Fair   Clinical Impairments Affecting Rehab Potential Positive: motivation; Negative: age, chronicity, bilateral knee pain   PT Frequency 2x / week   PT Duration 8 weeks   PT Treatment/Interventions ADLs/Self Care Home Management;Aquatic Therapy;Canalith Repostioning;Cryotherapy;Electrical Stimulation;Iontophoresis 4mg /ml Dexamethasone;Moist Heat;Traction;Ultrasound;DME Instruction;Gait training;Stair training;Functional mobility training;Therapeutic activities;Therapeutic exercise;Balance training;Neuromuscular re-education;Patient/family education;Wheelchair mobility training;Manual techniques;Passive range of motion;Vestibular   PT Next Visit Plan Progress balance and strengthening. Plan for transition to main clinic in order to utilize parallel bars   PT Home Exercise Plan Seated/supine glut sets, seated adduction squeezes, seated resisted abduction and/or clams, seated/supine hip flexion marching   Consulted and Agree with Plan of Care Patient;Family member/caregiver   Family Member Consulted Daughter      Patient will  benefit from skilled therapeutic intervention in order to improve the following deficits and impairments:  Abnormal gait, Decreased balance, Decreased strength, Difficulty walking, Obesity  Visit Diagnosis: Difficulty in walking, not elsewhere classified  Muscle weakness (generalized)  Unsteadiness on feet  History of falling     Problem List Patient Active Problem List   Diagnosis Date Noted  . Hemorrhoids 05/17/2016  . Obesity (BMI 30-39.9) 04/26/2016  . Multiple lung nodules 04/19/2016  . Chest pain 04/17/2016  . Partial symptomatic epilepsy with complex partial seizures, not intractable, without status epilepticus ([place]) 06/30/2015  . Bronchiectasis without complication ([place]) 40/34/7425  . Imbalance 11/08/2014  . Essential (primary) hypertension 11/08/2014  . Personal history of malignant neoplasm of breast 11/08/2014  . History of malignant carcinoid tumor of bronchus and  lung 11/08/2014  . H/O peptic ulcer 11/08/2014  . Asthma, mild intermittent 11/08/2014  . PE (pulmonary embolism) 11/08/2014  . Gonalgia 11/08/2014  . Leg varices 11/08/2014    Blythe Stanford, PT DPT 09/16/2016, 1:39 PM  [place] MAIN [facility] SERVICES [address] [place], [place], [address] Phone: [phone]   Fax:  [phone]  Name: KATHIE POSA MRN: [id number] Date of Birth: Jun 18, 1925

## 2016-09-18 ENCOUNTER — Ambulatory Visit: Payer: Medicare Other | Attending: Internal Medicine

## 2016-09-18 DIAGNOSIS — R2681 Unsteadiness on feet: Secondary | ICD-10-CM | POA: Diagnosis not present

## 2016-09-18 DIAGNOSIS — M6281 Muscle weakness (generalized): Secondary | ICD-10-CM | POA: Diagnosis not present

## 2016-09-18 DIAGNOSIS — R262 Difficulty in walking, not elsewhere classified: Secondary | ICD-10-CM | POA: Diagnosis not present

## 2016-09-18 NOTE — Therapy (Signed)
[place] MAIN [facility] SERVICES [address] [place], [place], [address] Phone: [phone]   Fax:  [phone]  Physical Therapy Treatment  Patient Details  Name: Rebecca Wolf MRN: [id number] Date of Birth: [birth year] Referring Provider: Oneita Kras  Encounter Date: 09/18/2016      PT End of Session - 09/18/16 1332    Visit Number 17   Number of Visits 24   Date for PT Re-Evaluation 15-Oct-2016   Authorization Type g codes 7/10   PT Start Time 1311   PT Stop Time 1345   PT Time Calculation (min) 34 min   Activity Tolerance Patient tolerated treatment well   Behavior During Therapy [facility] for tasks assessed/performed      Past Medical History:  Diagnosis Date  . Asthma   . Breast cancer ([place])    2003 and 2004, radiation and chemo for 2004 breast cancer  . 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  . GERD (gastroesophageal reflux disease)   . Hemorrhoids   . Hypertension   . Polyp of colon 2002   bleeding polyp of right ascending colon  . PUD (peptic ulcer disease)   . Seizures ([place])    epilepsy well controlled    Past Surgical History:  Procedure Laterality Date  . BREAST EXCISIONAL BIOPSY Left 2003   positive  . BREAST EXCISIONAL BIOPSY Left 2004   positive  . BREAST LUMPECTOMY Left 2004/2005   cancer  . HEMICOLECTOMY Right    bowel obstruction  . HEMORRHOID SURGERY    . THORACOTOMY/LOBECTOMY  1999   carcinoid tumor  . TOTAL VAGINAL HYSTERECTOMY    . VARICOSE VEIN SURGERY      There were no vitals filed for this visit.      Subjective Assessment - 09/18/16 1331    Subjective Patient reports no major changes since the last visit and reports she doing well today.    Patient is accompained by: --  Daughter   Pertinent History Pt reports that she has difficulty performing sit to stand from chairs and trouble with her balance. She also reports that her knees have been "locking" up on her over the last couple  weeks. She has been using a rollator for approximately 10 years to help with her balance. She has had approximatlely 3 falls over the last 12 months. All of the falls have occurred when she was not using her walker.    Limitations Walking   Patient Stated Goals Improve balance and strength with transfers   Currently in Pain? No/denies   Pain Onset More than a month ago      TREATMENT:  Hip adduction, abduction against manual resistance - 3 sec hold x 10 each direction Nustep with cueing on speed and to perform throughout full AROM - 43min; level 4;  Sit to stands from raised table - 2 x 10  Hamstring curls with GTB - x15 in sitting Weaving in and out of cones - x20 feet (6 cones) x 2  Walking ambulation with cueing on taking larger steps - 50ft       PT Education - 09/18/16 1332    Education provided Yes   Education Details form/technique with exercises   Person(s) Educated Patient   Methods Explanation;Demonstration   Comprehension Verbalized understanding;Returned demonstration             PT Long Term Goals - 08/15/16 1531      PT LONG TERM GOAL #1   Title  Pt will be independent with HEP in order to improve strength and balance in order to decrease fall risk and improve function at home and work   Baseline 08/15/16: Moderate cueing on form/technique   Time 6   Period Weeks   Status On-going     PT LONG TERM GOAL #2   Title Pt will improve ABC by at least 13% in order to demonstrate clinically significant improvement in balance confidence.    Baseline 06/17/16: 43.75% 08/15/16: 55%   Time 6   Period Weeks   Status On-going     PT LONG TERM GOAL #3   Title Pt will improve BERG to >30 points in order to demonstrate clinically significant improvement in balance.     Baseline 06/17/16: 17/56; 08/15/16: 24/56   Time 6   Period Weeks   Status On-going     PT LONG TERM GOAL #4   Title Pt will decrease TUG by at least seconds in order to demonstrate decreased fall risk     Baseline 06/17/16: 22.7 seconds; 08/15/16: 20.3sec   Time 6   Period Weeks   Status On-going     PT LONG TERM GOAL #5   Title Pt will decrease 5TSTS to <16 seconds in a standard chair in order to demonstrate clinically significant improvement in LE strength   Baseline 06/17/16: 40 seconds; 08/15/2016: 18sec from raised table   Time 8   Period Weeks   Status On-going               Plan - 09/18/16 1404    Clinical Impression Statement Patient demonstrates decreased standing tolerance but is able to perform greater amount of time on her feet compared to previous visits indicating functional carryover over visits. Although patient is improving, she continues to demonstrate decreased tolerance and balance and will benefit from further skilled therapy to return to prior level of function.    Rehab Potential Fair   Clinical Impairments Affecting Rehab Potential Positive: motivation; Negative: age, chronicity, bilateral knee pain   PT Frequency 2x / week   PT Duration 8 weeks   PT Treatment/Interventions ADLs/Self Care Home Management;Aquatic Therapy;Canalith Repostioning;Cryotherapy;Electrical Stimulation;Iontophoresis 4mg /ml Dexamethasone;Moist Heat;Traction;Ultrasound;DME Instruction;Gait training;Stair training;Functional mobility training;Therapeutic activities;Therapeutic exercise;Balance training;Neuromuscular re-education;Patient/family education;Wheelchair mobility training;Manual techniques;Passive range of motion;Vestibular   PT Next Visit Plan Progress balance and strengthening. Plan for transition to main clinic in order to utilize parallel bars   PT Home Exercise Plan Seated/supine glut sets, seated adduction squeezes, seated resisted abduction and/or clams, seated/supine hip flexion marching   Consulted and Agree with Plan of Care Patient;Family member/caregiver   Family Member Consulted Daughter      Patient will benefit from skilled therapeutic intervention in order to  improve the following deficits and impairments:  Abnormal gait, Decreased balance, Decreased strength, Difficulty walking, Obesity  Visit Diagnosis: Difficulty in walking, not elsewhere classified  Muscle weakness (generalized)  Unsteadiness on feet     Problem List Patient Active Problem List   Diagnosis Date Noted  . Hemorrhoids 05/17/2016  . Obesity (BMI 30-39.9) 04/26/2016  . Multiple lung nodules 04/19/2016  . Chest pain 04/17/2016  . Partial symptomatic epilepsy with complex partial seizures, not intractable, without status epilepticus ([place]) 06/30/2015  . Bronchiectasis without complication ([place]) 14/43/1540  . Imbalance 11/08/2014  . Essential (primary) hypertension 11/08/2014  . Personal history of malignant neoplasm of breast 11/08/2014  . History of malignant carcinoid tumor of bronchus and lung 11/08/2014  . H/O peptic ulcer 11/08/2014  . Asthma, mild  intermittent 11/08/2014  . PE (pulmonary embolism) 11/08/2014  . Gonalgia 11/08/2014  . Leg varices 11/08/2014    Blythe Stanford, PT DPT 09/18/2016, 2:07 PM  [place] MAIN [facility] SERVICES [address] [place], [place], [address] Phone: [phone]   Fax:  [phone]  Name: Rebecca Wolf MRN: [id number] Date of Birth: 02/05/1925

## 2016-09-23 ENCOUNTER — Ambulatory Visit: Payer: Medicare Other

## 2016-09-24 ENCOUNTER — Inpatient Hospital Stay (HOSPITAL_BASED_OUTPATIENT_CLINIC_OR_DEPARTMENT_OTHER): Payer: Medicare Other | Admitting: Hematology and Oncology

## 2016-09-24 ENCOUNTER — Inpatient Hospital Stay: Payer: Medicare Other | Attending: Hematology and Oncology

## 2016-09-24 ENCOUNTER — Encounter: Payer: Self-pay | Admitting: Hematology and Oncology

## 2016-09-24 VITALS — BP 142/83 | HR 77 | Temp 94.9°F | Resp 18 | Wt 181.3 lb

## 2016-09-24 DIAGNOSIS — I10 Essential (primary) hypertension: Secondary | ICD-10-CM | POA: Diagnosis not present

## 2016-09-24 DIAGNOSIS — M4800 Spinal stenosis, site unspecified: Secondary | ICD-10-CM | POA: Insufficient documentation

## 2016-09-24 DIAGNOSIS — Z85110 Personal history of malignant carcinoid tumor of bronchus and lung: Secondary | ICD-10-CM

## 2016-09-24 DIAGNOSIS — Z170 Estrogen receptor positive status [ER+]: Secondary | ICD-10-CM

## 2016-09-24 DIAGNOSIS — Z7901 Long term (current) use of anticoagulants: Secondary | ICD-10-CM | POA: Diagnosis not present

## 2016-09-24 DIAGNOSIS — Z86711 Personal history of pulmonary embolism: Secondary | ICD-10-CM

## 2016-09-24 DIAGNOSIS — G629 Polyneuropathy, unspecified: Secondary | ICD-10-CM | POA: Diagnosis not present

## 2016-09-24 DIAGNOSIS — Z86718 Personal history of other venous thrombosis and embolism: Secondary | ICD-10-CM | POA: Insufficient documentation

## 2016-09-24 DIAGNOSIS — R718 Other abnormality of red blood cells: Secondary | ICD-10-CM

## 2016-09-24 DIAGNOSIS — L905 Scar conditions and fibrosis of skin: Secondary | ICD-10-CM | POA: Insufficient documentation

## 2016-09-24 DIAGNOSIS — Z9223 Personal history of estrogen therapy: Secondary | ICD-10-CM

## 2016-09-24 DIAGNOSIS — Z87891 Personal history of nicotine dependence: Secondary | ICD-10-CM

## 2016-09-24 DIAGNOSIS — Z8719 Personal history of other diseases of the digestive system: Secondary | ICD-10-CM

## 2016-09-24 DIAGNOSIS — Z780 Asymptomatic menopausal state: Secondary | ICD-10-CM

## 2016-09-24 DIAGNOSIS — Z853 Personal history of malignant neoplasm of breast: Secondary | ICD-10-CM | POA: Insufficient documentation

## 2016-09-24 DIAGNOSIS — Z8711 Personal history of peptic ulcer disease: Secondary | ICD-10-CM | POA: Diagnosis not present

## 2016-09-24 DIAGNOSIS — Z86010 Personal history of colonic polyps: Secondary | ICD-10-CM | POA: Diagnosis not present

## 2016-09-24 DIAGNOSIS — Z923 Personal history of irradiation: Secondary | ICD-10-CM

## 2016-09-24 DIAGNOSIS — K219 Gastro-esophageal reflux disease without esophagitis: Secondary | ICD-10-CM | POA: Diagnosis not present

## 2016-09-24 DIAGNOSIS — Z902 Acquired absence of lung [part of]: Secondary | ICD-10-CM | POA: Insufficient documentation

## 2016-09-24 DIAGNOSIS — Z79899 Other long term (current) drug therapy: Secondary | ICD-10-CM | POA: Diagnosis not present

## 2016-09-24 DIAGNOSIS — D0592 Unspecified type of carcinoma in situ of left breast: Secondary | ICD-10-CM

## 2016-09-24 DIAGNOSIS — C7A090 Malignant carcinoid tumor of the bronchus and lung: Secondary | ICD-10-CM

## 2016-09-24 DIAGNOSIS — Y842 Radiological procedure and radiotherapy as the cause of abnormal reaction of the patient, or of later complication, without mention of misadventure at the time of the procedure: Secondary | ICD-10-CM | POA: Insufficient documentation

## 2016-09-24 LAB — COMPREHENSIVE METABOLIC PANEL
ALT: 24 U/L (ref 14–54)
AST: 30 U/L (ref 15–41)
Albumin: 3.8 g/dL (ref 3.5–5.0)
Alkaline Phosphatase: 77 U/L (ref 38–126)
Anion gap: 7 (ref 5–15)
BUN: 22 mg/dL — ABNORMAL HIGH (ref 6–20)
CO2: 27 mmol/L (ref 22–32)
Calcium: 9.5 mg/dL (ref 8.9–10.3)
Chloride: 102 mmol/L (ref 101–111)
Creatinine, Ser: 1.13 mg/dL — ABNORMAL HIGH (ref 0.44–1.00)
GFR calc Af Amer: 48 mL/min — ABNORMAL LOW (ref >60)
GFR calc non Af Amer: 41 mL/min — ABNORMAL LOW (ref >60)
Glucose, Bld: 155 mg/dL — ABNORMAL HIGH (ref 65–99)
Potassium: 3.8 mmol/L (ref 3.5–5.1)
Sodium: 136 mmol/L (ref 135–145)
Total Bilirubin: 0.5 mg/dL (ref 0.3–1.2)
Total Protein: 8.3 g/dL — ABNORMAL HIGH (ref 6.5–8.1)

## 2016-09-24 LAB — IRON AND TIBC
Iron: 48 ug/dL (ref 28–170)
Saturation Ratios: 12 % (ref 10.4–31.8)
TIBC: 394 ug/dL (ref 250–450)
UIBC: 346 ug/dL

## 2016-09-24 LAB — CBC WITH DIFFERENTIAL/PLATELET
Basophils Absolute: 0 10*3/uL (ref 0–0.1)
Basophils Relative: 0 %
Eosinophils Absolute: 0.4 10*3/uL (ref 0–0.7)
Eosinophils Relative: 5 %
HCT: 36.6 % (ref 35.0–47.0)
Hemoglobin: 12.2 g/dL (ref 12.0–16.0)
Lymphocytes Relative: 33 %
Lymphs Abs: 2.6 10*3/uL (ref 1.0–3.6)
MCH: 25.3 pg — ABNORMAL LOW (ref 26.0–34.0)
MCHC: 33.2 g/dL (ref 32.0–36.0)
MCV: 76.1 fL — ABNORMAL LOW (ref 80.0–100.0)
Monocytes Absolute: 0.5 10*3/uL (ref 0.2–0.9)
Monocytes Relative: 7 %
Neutro Abs: 4.3 10*3/uL (ref 1.4–6.5)
Neutrophils Relative %: 55 %
Platelets: 220 10*3/uL (ref 150–440)
RBC: 4.81 MIL/uL (ref 3.80–5.20)
RDW: 14.9 % — ABNORMAL HIGH (ref 11.5–14.5)
WBC: 7.8 10*3/uL (ref 3.6–11.0)

## 2016-09-24 LAB — FERRITIN: Ferritin: 43 ng/mL (ref 11–307)

## 2016-09-24 NOTE — Progress Notes (Signed)
[facility] day:  09/24/2016   Chief Complaint: Rebecca Wolf is a 83 y.o. female with a history of breast cancer, carcinoid tumor, and DVT/PE who is seen for 1 year assessment.  HPI:  The patient was last seen in the medical oncology clinic on 09/25/2015.  At that time, she was seen for initial assessment by me.  Symptomatically, she had issues secondary to spinal stenosis in her back.  She had a neuropathy in her hands and feet.  Weight was stable.  CA27.29 was 21.6 (normal).  During the interim, she has been "okay".  She has some chronic shortness of breath for which she is followed by Dr. Raul Del. She has gained 9 pounds.  She is eating well. She is going to physical therapy for her spinal stenosis.  She describes her legs being sore after exercise.  She denies any breast concerns.  She denies any hot flashes, flushing or diarrhea.   Past Medical History:  Diagnosis Date  . Asthma   . Breast cancer ([place])    2003 and 2004, radiation and chemo for 2004 breast cancer  . 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  . GERD (gastroesophageal reflux disease)   . Hemorrhoids   . Hypertension   . Polyp of colon 2002   bleeding polyp of right ascending colon  . PUD (peptic ulcer disease)   . Seizures ([place])    epilepsy well controlled    Past Surgical History:  Procedure Laterality Date  . BREAST EXCISIONAL BIOPSY Left 2003   positive  . BREAST EXCISIONAL BIOPSY Left 2004   positive  . BREAST LUMPECTOMY Left 2004/2005   cancer  . HEMICOLECTOMY Right    bowel obstruction  . HEMORRHOID SURGERY    . THORACOTOMY/LOBECTOMY  1999   carcinoid tumor  . TOTAL VAGINAL HYSTERECTOMY    . VARICOSE VEIN SURGERY      Family History  Problem Relation Age of Onset  . Alcoholism Father   . Diabetes Sister     Social History:  reports that she has quit smoking. She has never used smokeless tobacco. She reports that she does not drink alcohol  or use drugs.  She moved from [place] to [place].  The patient is accompanied by her daughter, [name], today.  Allergies:  Allergies  Allergen Reactions  . Levofloxacin   . Sulfa Antibiotics     Current Medications: Current Outpatient Prescriptions  Medication Sig Dispense Refill  . acetaminophen (TYLENOL) 500 MG tablet Take 500 mg by mouth every 6 (six) hours as needed.    Marland Kitchen albuterol (ACCUNEB) 1.25 MG/3ML nebulizer solution Inhale 1 ampule into the lungs 4 (four) times daily as needed.    Marland Kitchen albuterol (PROVENTIL HFA;VENTOLIN HFA) 108 (90 BASE) MCG/ACT inhaler Inhale 1 puff into the lungs 4 (four) times daily.    Marland Kitchen albuterol (PROVENTIL) (2.5 MG/3ML) 0.083% nebulizer solution USE 1 VIAL VIA NEBULIZER EVERY 6 HOURS AS NEEDED FOR WHEEZING 75 mL 5  . amLODipine (NORVASC) 5 MG tablet TAKE 1 TABLET BY MOUTH EVERY DAY 90 tablet 0  . ELIQUIS 2.5 MG TABS tablet TAKE 1 TABLET BY MOUTH TWICE DAILY 60 tablet 12  . fluticasone (FLONASE) 50 MCG/ACT nasal spray Place 2 sprays into the nose daily.    . Fluticasone-Salmeterol (ADVAIR) 500-50 MCG/DOSE AEPB Inhale 1 puff into the lungs 2 (two) times daily.    . hydrocortisone cream 1 % Apply 1 application topically 2 (two) times daily.  30 g 0  . Ibuprofen 200 MG CAPS Take by mouth.    . levETIRAcetam (KEPPRA) 750 MG tablet Take 1 tablet by mouth 2 (two) times daily.    . montelukast (SINGULAIR) 10 MG tablet Take 1 tablet by mouth daily.    Marland Kitchen nystatin (MYCOSTATIN) powder Apply topically 3 (three) times daily. 15 g 0  . pantoprazole (PROTONIX) 40 MG tablet TAKE 1 TABLET BY MOUTH TWICE DAILY 60 tablet 12  . spironolactone-hydrochlorothiazide (ALDACTAZIDE) 25-25 MG tablet TAKE 1 TABLET BY MOUTH DAILY 90 tablet 1  . sucralfate (CARAFATE) 1 g tablet Take 1 tablet (1 g total) by mouth 4 (four) times daily -  with meals and at bedtime. 90 tablet 2  . NONFORMULARY OR COMPOUNDED ITEM Peripheral Neuropathy [facility] 2 refills    . sucralfate  (CARAFATE) 1 g tablet TK 1 T PO QID WITH MEALS AND HS     No current facility-administered medications for this visit.     Review of Systems:  GENERAL:  Feels "ok".  No fevers or sweats.  Weight up 8 pounds. PERFORMANCE STATUS (ECOG):  2 HEENT:  No visual changes, runny nose, sore throat, mouth sores or tenderness. Lungs: Chronic shortness of breath (followed by pulmonary).  No cough.  No hemoptysis. Cardiac:  No chest pain, palpitations, orthopnea, or PND. GI:  Eating well.  No nausea, vomiting, diarrhea, constipation, melena or hematochezia. GU:  No urgency, frequency, dysuria, or hematuria. Musculoskeletal:  No back pain.  No joint pain.  No muscle tenderness. Extremities:  No pain or swelling. Skin:  No skin changes. Neuro:  Neuropathy in hands and feet.  No headache, focal weakness, balance or coordination issues. Endocrine:  No diabetes, thyroid issues, hot flashes or night sweats. Psych:  No mood changes, depression or anxiety. Pain:  No focal pain. Review of systems:  All other systems reviewed and found to be negative.  Physical Exam: Blood pressure (!) 142/83, pulse 77, temperature (!) 94.9 F (34.9 C), temperature source Tympanic, resp. rate 18, weight 181 lb 5 oz (82.2 kg). GENERAL:  Well developed, well nourished, woman sitting comfortably in a wheelchair in the exam room in no acute distress. MENTAL STATUS:  Alert and oriented to person, place and time. HEAD:  Pearline Cables hair.  Normocephalic, atraumatic, face symmetric, no Cushingoid features. EYES:  Blue eyes.  Pupils equal round and reactive to light and accomodation.  No conjunctivitis or scleral icterus. ENT:  Oropharynx clear without lesion.  Tongue normal. Mucous membranes moist.  RESPIRATORY:  Clear to auscultation without rales, wheezes or rhonchi. CARDIOVASCULAR:  Regular rate and rhythm without murmur, rub or gallop. BREAST:  Right breast without masses, skin changes or nipple discharge.  Left breast upper inner  quadrant with post operative scarring with post radiation changes.  No skin changes or nipple discharge.  ABDOMEN:  Soft, non-tender, with active bowel sounds, and no hepatosplenomegaly.  No masses. SKIN:  No rashes or skin changes. EXTREMITIES: No edema, no skin discoloration or tenderness.  No palpable cords. LYMPH NODES: No palpable cervical, supraclavicular, axillary or inguinal adenopathy  NEUROLOGICAL: Unremarkable. PSYCH:  Appropriate.   Appointment on 09/24/2016  Component Date Value Ref Range Status  . WBC 09/24/2016 7.8  3.6 - 11.0 K/uL Final  . RBC 09/24/2016 4.81  3.80 - 5.20 MIL/uL Final  . Hemoglobin 09/24/2016 12.2  12.0 - 16.0 g/dL Final  . HCT 09/24/2016 36.6  35.0 - 47.0 % Final  . MCV 09/24/2016 76.1* 80.0 - 100.0 fL Final  .  MCH 09/24/2016 25.3* 26.0 - 34.0 pg Final  . MCHC 09/24/2016 33.2  32.0 - 36.0 g/dL Final  . RDW 09/24/2016 14.9* 11.5 - 14.5 % Final  . Platelets 09/24/2016 220  150 - 440 K/uL Final  . Neutrophils Relative % 09/24/2016 55  % Final  . Neutro Abs 09/24/2016 4.3  1.4 - 6.5 K/uL Final  . Lymphocytes Relative 09/24/2016 33  % Final  . Lymphs Abs 09/24/2016 2.6  1.0 - 3.6 K/uL Final  . Monocytes Relative 09/24/2016 7  % Final  . Monocytes Absolute 09/24/2016 0.5  0.2 - 0.9 K/uL Final  . Eosinophils Relative 09/24/2016 5  % Final  . Eosinophils Absolute 09/24/2016 0.4  0 - 0.7 K/uL Final  . Basophils Relative 09/24/2016 0  % Final  . Basophils Absolute 09/24/2016 0.0  0 - 0.1 K/uL Final    Assessment:  Rebecca Wolf is a 83 y.o. female with a history of breast cancer, carcinoid tumor, and DVT/PE.  She was diagnosed with precancerous breast lesion (? DCIS) in 2003.  She underwent lumpectomy.  She developed invasive lobular carcinoma in 2007. She underwent segmental mastectomy and sentinel lymph node biopsy followed by radiation.  No lymph nodes were involved.  Tumor was ER/PR positive.  She completed 5 years of Arimidex in 08/2011. Mammogram on  08/09/2013 was negative.  CA27.29 was 25.8 (normal) on 09/25/2015.  She was diagnosed with pulmonary carcinoid in 2001 after presenting with an abnormality on chest CT.  She was asymptomatic.  She underwent resection.  She has had no evidence of recurrent disease.  She has a history of DVT x 2 and pulmonary embolism x 2.  She has been on anticoagulation for 8-10 years.  She was initially on Coumadin, but switched to Eliquis in 2016.  She is unaware of any hypercoagulable work-up.   She has a history of GI bleeding in 2002.  She is s/p partial right ascending hemicolectomy for a bleeding polyp.  She has a history of reflux and peptic ulcer disease.  Hematocrit is normal (37.8).  MCV is microcytic.  While in [place], a hemoglobin electrophoresis was performed (no result available).  She was started on oral on 08/31/2012.  She is currently not taking iron.  Symptomatically, she is doing well.  Exam is stable.  Hematocrit is normal.  MVC is slightly low.  Plan: 1. Labs today: CBC with diff, CMP, CA27.29. 2. Mammogram on 10/16/2016. 3. Order next year's mammogram after results back from this year's mammogram. 4. Continue Eliquis. 5. RTC in 1 year for MD assessment, labs (CBC with diff, CMP, CA27.29), and review of mammogram.   Lequita Asal, MD  09/24/2016, 11:58 AM

## 2016-09-24 NOTE — Progress Notes (Signed)
Patient offers no complaints today. Patient has been taking PT for her legs.  States she has a f/u with her PCP tomorrow.

## 2016-09-25 DIAGNOSIS — M5136 Other intervertebral disc degeneration, lumbar region: Secondary | ICD-10-CM | POA: Diagnosis not present

## 2016-09-25 DIAGNOSIS — M170 Bilateral primary osteoarthritis of knee: Secondary | ICD-10-CM | POA: Diagnosis not present

## 2016-09-25 DIAGNOSIS — M5416 Radiculopathy, lumbar region: Secondary | ICD-10-CM | POA: Diagnosis not present

## 2016-09-25 LAB — CANCER ANTIGEN 27.29: CA 27.29: 20.7 U/mL (ref 0.0–38.6)

## 2016-10-01 ENCOUNTER — Ambulatory Visit: Payer: Medicare Other | Attending: Internal Medicine

## 2016-10-01 DIAGNOSIS — M6281 Muscle weakness (generalized): Secondary | ICD-10-CM

## 2016-10-01 DIAGNOSIS — Z9181 History of falling: Secondary | ICD-10-CM | POA: Diagnosis not present

## 2016-10-01 DIAGNOSIS — R262 Difficulty in walking, not elsewhere classified: Secondary | ICD-10-CM | POA: Diagnosis not present

## 2016-10-01 DIAGNOSIS — R2681 Unsteadiness on feet: Secondary | ICD-10-CM

## 2016-10-01 NOTE — Therapy (Signed)
[place] MAIN [facility] SERVICES [address] [place], [place], [address] Phone: [phone]   Fax:  [phone]  Physical Therapy Treatment  Patient Details  Name: Rebecca Wolf MRN: [id number] Date of Birth: 04/24/1925 Referring Provider: Oneita Kras  Encounter Date: 10/01/2016      PT End of Session - 10/01/16 1113    Visit Number 18   Number of Visits 30   Date for PT Re-Evaluation 2016-11-17   Authorization Type g codes 8/10   PT Start Time 1103-10-17   PT Stop Time 1145   PT Time Calculation (min) 40 min   Equipment Utilized During Treatment Gait belt   Activity Tolerance Patient tolerated treatment well   Behavior During Therapy [facility] for tasks assessed/performed      Past Medical History:  Diagnosis Date  . Asthma   . Breast cancer ([place])    10/16/01 and October 17, 2002, radiation and chemo for Oct 17, 2002 breast cancer  . Carcinoid tumor determined by biopsy of lung 10-17-99  . GERD (gastroesophageal reflux disease)   . Hemorrhoids   . Hypertension   . Polyp of colon 10-16-2000   bleeding polyp of right ascending colon  . PUD (peptic ulcer disease)   . Seizures ([place])    epilepsy well controlled    Past Surgical History:  Procedure Laterality Date  . BREAST EXCISIONAL BIOPSY Left 10-16-2001   positive  . BREAST EXCISIONAL BIOPSY Left 17-Oct-2002   positive  . BREAST LUMPECTOMY Left 2004/2005   cancer  . HEMICOLECTOMY Right    bowel obstruction  . HEMORRHOID SURGERY    . THORACOTOMY/LOBECTOMY  1999   carcinoid tumor  . TOTAL VAGINAL HYSTERECTOMY    . VARICOSE VEIN SURGERY      There were no vitals filed for this visit.      Subjective Assessment - 10/01/16 1112    Subjective Patient reports she received 2 cortizone injections in both her knees and states she would like to perform exercises in the  bars.    Patient is accompained by: --  Daughter   Pertinent History Pt reports that she has difficulty performing sit to stand from chairs and trouble with her  balance. She also reports that her knees have been "locking" up on her over the last couple weeks. She has been using a rollator for approximately 10 years to help with her balance. She has had approximatlely 3 falls over the last 12 months. All of the falls have occurred when she was not using her walker.    Limitations Walking   Patient Stated Goals Improve balance and strength with transfers   Currently in Pain? No/denies   Pain Onset More than a month ago      TREATMENT:   Nustep with cueing on speed and to perform throughout full AROM - 60min; level 2  Feet together balance on airex pad - 30 sec x 4  Step taps from purple airex pad to 8" step with UE support - x20 Hip abduction in standing - x 10 Hip extension in standing - x 10 Step ups from  airex pad onto 6" step - x 10 B Marches in standing on purple airex pad - x10 B Seated ball/glue squeezes - x 10  Seated plantar flexion - x 10  LAQ in sitting - x 10  Bilateral dorsiflexion in sitting - x10  Seated hip abduction without resistance - x 10 Seated marches - x 20  Seated ball roll outs in sitting -  x10  Resisted Leg press in sitting -x 10        PT Education - 10/01/16 1113    Education provided Yes   Education Details form technique with exercise   Person(s) Educated Patient   Methods Explanation;Demonstration   Comprehension Verbalized understanding;Returned demonstration             PT Long Term Goals - 10/01/16 1154      PT LONG TERM GOAL #1   Title Pt will be independent with HEP in order to improve strength and balance in order to decrease fall risk and improve function at home and work   Baseline 08/15/16: Moderate cueing on form/technique   Time 6   Period Weeks   Status On-going     PT LONG TERM GOAL #2   Title Pt will improve ABC by at least 13% in order to demonstrate clinically significant improvement in balance confidence.    Baseline 06/17/16: 43.75% 08/15/16: 55%   Time 6   Period Weeks    Status On-going     PT LONG TERM GOAL #3   Title Pt will improve BERG to >30 points in order to demonstrate clinically significant improvement in balance.     Baseline 06/17/16: 17/56; 08/15/16: 24/56   Time 6   Period Weeks   Status On-going     PT LONG TERM GOAL #4   Title Pt will decrease TUG by at least seconds in order to demonstrate decreased fall risk    Baseline 06/17/16: 22.7 seconds; 08/15/16: 20.3sec   Time 6   Period Weeks   Status On-going     PT LONG TERM GOAL #5   Title Pt will decrease 5TSTS to <16 seconds in a standard chair in order to demonstrate clinically significant improvement in LE strength   Baseline 06/17/16: 40 seconds; 08/15/2016: 18sec from raised table   Time 8   Period Weeks   Status On-going               Plan - 10/01/16 1151    Clinical Impression Statement Focused on improving LE strength and balance today as patient demonstrates difficulties to standing tolerance and walking ability. Focused on improving LE strength without increasing pain and patient demonstrates decreased soreness at end of session compared to previous visits. Did not update goals today secondary to patient perference to perform exercises in  bars today, educated patient on performing update of goals on next visit. Patient will benefit from further skilled therapy focused on improving strength and balance to return to prior level of function.    Rehab Potential Fair   Clinical Impairments Affecting Rehab Potential Positive: motivation; Negative: age, chronicity, bilateral knee pain   PT Frequency 2x / week   PT Duration 8 weeks   PT Treatment/Interventions ADLs/Self Care Home Management;Aquatic Therapy;Canalith Repostioning;Cryotherapy;Electrical Stimulation;Iontophoresis 4mg /ml Dexamethasone;Moist Heat;Traction;Ultrasound;DME Instruction;Gait training;Stair training;Functional mobility training;Therapeutic activities;Therapeutic exercise;Balance training;Neuromuscular  re-education;Patient/family education;Wheelchair mobility training;Manual techniques;Passive range of motion;Vestibular   PT Next Visit Plan Progress balance and strengthening. Plan for transition to main clinic in order to utilize parallel bars   PT Home Exercise Plan Seated/supine glut sets, seated adduction squeezes, seated resisted abduction and/or clams, seated/supine hip flexion marching   Consulted and Agree with Plan of Care Patient;Family member/caregiver   Family Member Consulted Daughter      Patient will benefit from skilled therapeutic intervention in order to improve the following deficits and impairments:  Abnormal gait, Decreased balance, Decreased strength, Difficulty walking, Obesity  Visit Diagnosis:  Difficulty in walking, not elsewhere classified  Muscle weakness (generalized)  Unsteadiness on feet     Problem List Patient Active Problem List   Diagnosis Date Noted  . Hemorrhoids 05/17/2016  . Obesity (BMI 30-39.9) 04/26/2016  . Multiple lung nodules 04/19/2016  . Chest pain 04/17/2016  . Partial symptomatic epilepsy with complex partial seizures, not intractable, without status epilepticus ([place]) 06/30/2015  . Bronchiectasis without complication ([place]) 88/75/7972  . Imbalance 11/08/2014  . Essential (primary) hypertension 11/08/2014  . Personal history of malignant neoplasm of breast 11/08/2014  . History of malignant carcinoid tumor of bronchus and lung 11/08/2014  . H/O peptic ulcer 11/08/2014  . Asthma, mild intermittent 11/08/2014  . PE (pulmonary embolism) 11/08/2014  . Gonalgia 11/08/2014  . Leg varices 11/08/2014    Blythe Stanford, PT DPT  10/01/2016, 11:55 AM  [place] MAIN [facility] SERVICES [address] [place], [place], [address] Phone: [phone]   Fax:  [phone]  Name: LIBBEY DUCE MRN: [id number] Date of Birth: 1924-10-13

## 2016-10-03 ENCOUNTER — Ambulatory Visit: Payer: Medicare Other

## 2016-10-03 DIAGNOSIS — Z9181 History of falling: Secondary | ICD-10-CM | POA: Diagnosis not present

## 2016-10-03 DIAGNOSIS — R2681 Unsteadiness on feet: Secondary | ICD-10-CM | POA: Diagnosis not present

## 2016-10-03 DIAGNOSIS — R262 Difficulty in walking, not elsewhere classified: Secondary | ICD-10-CM | POA: Diagnosis not present

## 2016-10-03 DIAGNOSIS — M6281 Muscle weakness (generalized): Secondary | ICD-10-CM | POA: Diagnosis not present

## 2016-10-03 NOTE — Therapy (Signed)
[place] MAIN [facility] SERVICES [address] [place], [place], [address] Phone: [phone]   Fax:  [phone]  Physical Therapy Treatment  Patient Details  Name: Rebecca Wolf MRN: [id number] Date of Birth: 1925/03/17 Referring Provider: Oneita Kras  Encounter Date: 10/03/2016      PT End of Session - 10/03/16 1154    Visit Number 19   Number of Visits 30   Date for PT Re-Evaluation 11-13-2016   Authorization Type g codes Mar 26, 2023   PT Start Time 1110   PT Stop Time 1143   PT Time Calculation (min) 33 min   Equipment Utilized During Treatment Gait belt   Activity Tolerance Patient tolerated treatment well   Behavior During Therapy [facility] for tasks assessed/performed      Past Medical History:  Diagnosis Date  . Asthma   . Breast cancer ([place])    2003 and 2004, radiation and chemo for 2004 breast cancer  . 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  . GERD (gastroesophageal reflux disease)   . Hemorrhoids   . Hypertension   . Polyp of colon 2002   bleeding polyp of right ascending colon  . PUD (peptic ulcer disease)   . Seizures ([place])    epilepsy well controlled    Past Surgical History:  Procedure Laterality Date  . BREAST EXCISIONAL BIOPSY Left 2003   positive  . BREAST EXCISIONAL BIOPSY Left 2004   positive  . BREAST LUMPECTOMY Left 2004/2005   cancer  . HEMICOLECTOMY Right    bowel obstruction  . HEMORRHOID SURGERY    . THORACOTOMY/LOBECTOMY  1999   carcinoid tumor  . TOTAL VAGINAL HYSTERECTOMY    . VARICOSE VEIN SURGERY      There were no vitals filed for this visit.      Subjective Assessment - 10/03/16 1114    Subjective Patient reports she is doing well and has not had any major changes.   Patient is accompained by: --  Daughter   Pertinent History Pt reports that she has difficulty performing sit to stand from chairs and trouble with her balance. She also reports that her knees have been "locking" up on  her over the last couple weeks. She has been using a rollator for approximately 10 years to help with her balance. She has had approximatlely 3 falls over the last 12 months. All of the falls have occurred when she was not using her walker.    Limitations Walking   Patient Stated Goals Improve balance and strength with transfers   Currently in Pain? No/denies   Pain Onset More than a month ago      TREATMENT:    Nustep with cueing on speed and to perform throughout full AROM - 32min; level 3 Forward/Backward stepping in the  bars - x10 with focus on heel strike Side stepping in  bars - x 7 with UE support Feet together balance on airex pad - 45 sec x 2 Seated marches -  x 10; x10 2.5# on B ankles LAQ in sitting - x10 ; x 10 with 2.5# on B ankles Seated plantar flexion - 2x 10 with 2.5# on B ankles Bilateral dorsiflexion in sitting - 2x10 with 2.5# on B ankles Seated ball roll outs in sitting - x10  with 4# ball with 2.5# on B ankles Seated hip adduction with yellow ball 4# -- x 10  Standing heel raises in  bars - 2x 10 Marches in standing- 2x10  B Resisted Leg press in sitting -x 10  Hip abduction in standing - x 10       PT Education - 10/03/16 1155    Education provided Yes   Education Details Educated on rest and performance on Exercises at home   Person(s) Educated Patient   Methods Explanation;Demonstration   Comprehension Verbalized understanding;Returned demonstration             PT Long Term Goals - 10/01/16 1154      PT LONG TERM GOAL #1   Title Pt will be independent with HEP in order to improve strength and balance in order to decrease fall risk and improve function at home and work   Baseline 08/15/16: Moderate cueing on form/technique   Time 6   Period Weeks   Status On-going     PT LONG TERM GOAL #2   Title Pt will improve ABC by at least 13% in order to demonstrate clinically significant improvement in balance confidence.    Baseline 06/17/16:  43.75% 08/15/16: 55%   Time 6   Period Weeks   Status On-going     PT LONG TERM GOAL #3   Title Pt will improve BERG to >30 points in order to demonstrate clinically significant improvement in balance.     Baseline 06/17/16: 17/56; 08/15/16: 24/56   Time 6   Period Weeks   Status On-going     PT LONG TERM GOAL #4   Title Pt will decrease TUG by at least seconds in order to demonstrate decreased fall risk    Baseline 06/17/16: 22.7 seconds; 08/15/16: 20.3sec   Time 6   Period Weeks   Status On-going     PT LONG TERM GOAL #5   Title Pt will decrease 5TSTS to <16 seconds in a standard chair in order to demonstrate clinically significant improvement in LE strength   Baseline 06/17/16: 40 seconds; 08/15/2016: 18sec from raised table   Time 8   Period Weeks   Status On-going               Plan - 10/03/16 1155    Clinical Impression Statement Patient performed greater repetitions of exercises today without increase in pain indicating functional carryover between visits. Patient continues to demonstrate decreased LE strength and balance and will benefit from further skilled therapy to return to prior level of function.    Rehab Potential Fair   Clinical Impairments Affecting Rehab Potential Positive: motivation; Negative: age, chronicity, bilateral knee pain   PT Frequency 2x / week   PT Duration 8 weeks   PT Treatment/Interventions ADLs/Self Care Home Management;Aquatic Therapy;Canalith Repostioning;Cryotherapy;Electrical Stimulation;Iontophoresis 4mg /ml Dexamethasone;Moist Heat;Traction;Ultrasound;DME Instruction;Gait training;Stair training;Functional mobility training;Therapeutic activities;Therapeutic exercise;Balance training;Neuromuscular re-education;Patient/family education;Wheelchair mobility training;Manual techniques;Passive range of motion;Vestibular   PT Next Visit Plan Progress balance and strengthening. Plan for transition to main clinic in order to utilize parallel bars    PT Home Exercise Plan Seated/supine glut sets, seated adduction squeezes, seated resisted abduction and/or clams, seated/supine hip flexion marching   Consulted and Agree with Plan of Care Patient;Family member/caregiver   Family Member Consulted Daughter      Patient will benefit from skilled therapeutic intervention in order to improve the following deficits and impairments:  Abnormal gait, Decreased balance, Decreased strength, Difficulty walking, Obesity  Visit Diagnosis: Difficulty in walking, not elsewhere classified  Muscle weakness (generalized)  Unsteadiness on feet     Problem List Patient Active Problem List   Diagnosis Date Noted  . Hemorrhoids 05/17/2016  .  Obesity (BMI 30-39.9) 04/26/2016  . Multiple lung nodules 04/19/2016  . Chest pain 04/17/2016  . Partial symptomatic epilepsy with complex partial seizures, not intractable, without status epilepticus ([place]) 06/30/2015  . Bronchiectasis without complication ([place]) 09/38/1829  . Imbalance 11/08/2014  . Essential (primary) hypertension 11/08/2014  . Personal history of malignant neoplasm of breast 11/08/2014  . History of malignant carcinoid tumor of bronchus and lung 11/08/2014  . H/O peptic ulcer 11/08/2014  . Asthma, mild intermittent 11/08/2014  . PE (pulmonary embolism) 11/08/2014  . Gonalgia 11/08/2014  . Leg varices 11/08/2014    Blythe Stanford, PT DPT 10/03/2016, 12:06 PM  [place] MAIN [facility] SERVICES [address] [place], [place], [address] Phone: [phone]   Fax:  [phone]  Name: Rebecca Wolf MRN: [id number] Date of Birth: 1925/02/06

## 2016-10-07 ENCOUNTER — Ambulatory Visit: Payer: Medicare Other

## 2016-10-07 DIAGNOSIS — R262 Difficulty in walking, not elsewhere classified: Secondary | ICD-10-CM | POA: Diagnosis not present

## 2016-10-07 DIAGNOSIS — R2681 Unsteadiness on feet: Secondary | ICD-10-CM | POA: Diagnosis not present

## 2016-10-07 DIAGNOSIS — Z9181 History of falling: Secondary | ICD-10-CM

## 2016-10-07 DIAGNOSIS — M6281 Muscle weakness (generalized): Secondary | ICD-10-CM | POA: Diagnosis not present

## 2016-10-07 NOTE — Therapy (Signed)
[place] MAIN [facility] SERVICES [address] [place], [place], [address] Phone: [phone]   Fax:  [phone]  Physical Therapy Treatment  Patient Details  Name: Rebecca Wolf MRN: [id number] Date of Birth: 04-05-1925 Referring Provider: Oneita Kras  Encounter Date: 10/07/2016      PT End of Session - 10/07/16 1136    Visit Number 20   Number of Visits 30   Date for PT Re-Evaluation 2016-11-11   Authorization Type g codes 10/10   PT Start Time 1106   PT Stop Time 1145   PT Time Calculation (min) 39 min   Equipment Utilized During Treatment Gait belt   Activity Tolerance Patient tolerated treatment well   Behavior During Therapy [facility] for tasks assessed/performed      Past Medical History:  Diagnosis Date  . Asthma   . Breast cancer ([place])    2003 and 2004, radiation and chemo for 2004 breast cancer  . 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  . GERD (gastroesophageal reflux disease)   . Hemorrhoids   . Hypertension   . Polyp of colon 2002   bleeding polyp of right ascending colon  . PUD (peptic ulcer disease)   . Seizures ([place])    epilepsy well controlled    Past Surgical History:  Procedure Laterality Date  . BREAST EXCISIONAL BIOPSY Left 2003   positive  . BREAST EXCISIONAL BIOPSY Left 2004   positive  . BREAST LUMPECTOMY Left 2004/2005   cancer  . HEMICOLECTOMY Right    bowel obstruction  . HEMORRHOID SURGERY    . THORACOTOMY/LOBECTOMY  1999   carcinoid tumor  . TOTAL VAGINAL HYSTERECTOMY    . VARICOSE VEIN SURGERY      There were no vitals filed for this visit.      Subjective Assessment - 10/07/16 1133    Subjective Patient reports shes able to walk around her house wihtout use of an AD and states she feels a lot stonger since the beginning of therapy. Patient states she continues to have blance difficulties and is unable to stand for long periods of time.    Patient is accompained by: --  Daughter   Pertinent History Pt reports that she has difficulty performing sit to stand from chairs and trouble with her balance. She also reports that her knees have been "locking" up on her over the last couple weeks. She has been using a rollator for approximately 10 years to help with her balance. She has had approximatlely 3 falls over the last 12 months. All of the falls have occurred when she was not using her walker.    Limitations Walking   Patient Stated Goals Improve balance and strength with transfers   Currently in Pain? No/denies   Pain Onset More than a month ago            [facility] PT Assessment - 10/07/16 0001      Standardized Balance Assessment   Five times sit to stand comments  12sec  From raised table     Berg Balance Test   Sit to Stand Able to stand  independently using hands   Standing Unsupported Able to stand 2 minutes with supervision   Sitting with Back Unsupported but Feet Supported on Floor or Stool Able to sit safely and securely 2 minutes   Stand to Sit Controls descent by using hands   Transfers Able to transfer safely, definite need of hands   Standing Unsupported  with Eyes Closed Able to stand 10 seconds with supervision   Standing Ubsupported with Feet Together Able to place feet together independently and stand for 1 minute with supervision   From Standing, Reach Forward with Outstretched Arm Can reach forward >5 cm safely (2")   From Standing Position, Pick up Object from [place] to pick up shoe, needs supervision   From Standing Position, Turn to Look Behind Over each Shoulder Turn sideways only but maintains balance   Turn 360 Degrees Able to turn 360 degrees safely but slowly   Standing Unsupported, Alternately Place Feet on Step/Stool Needs assistance to keep from falling or unable to try   Standing Unsupported, One Foot in [employer] balance while stepping or standing   Standing on One Leg Unable to try or needs assist to prevent fall   Total Score 31      Timed Up and Go Test   Normal TUG (seconds) 17.83  w/o use of FWW        TREATMENT:    Nustep with cueing on speed and to perform throughout full AROM - 15min; level 4 LAQ in sitting -x 10 with 5# on B ankles Resisted Leg press in sitting -x 10  Standing heel raises in  bars - x 10 Standing Marches with hand held support from PT - x 15 B Standing turns for balance and support without UE support - x 10 B Standing leaning forward for distance - 5 x 5 sec holds   Observation: TUG: 17.83; ABC: 56.9%; 5xSTS from raised table: 12 sec        PT Education - 10/07/16 1136    Education provided Yes   Education Details Form and technique with exercise   Person(s) Educated Patient   Methods Explanation;Demonstration   Comprehension Verbalized understanding;Returned demonstration             PT Long Term Goals - 10/07/16 1117      PT LONG TERM GOAL #1   Title Pt will be independent with HEP in order to improve strength and balance in order to decrease fall risk and improve function at home and work   Baseline 08/15/16: Moderate cueing on form/technique 10/07/16: Requires min cueing on technique and form   Time 6   Period Weeks   Status On-going     PT LONG TERM GOAL #2   Title Pt will improve ABC by at least 13% in order to demonstrate clinically significant improvement in balance confidence.    Baseline 06/17/16: 43.75% 08/15/16: 55% 10/07/16: 56.9%   Time 6   Period Weeks   Status Achieved     PT LONG TERM GOAL #3   Title Pt will improve BERG to >30 points in order to demonstrate clinically significant improvement in balance.     Baseline 06/17/16: 17/56; 08/15/16: 24/56   Time 6   Period Weeks   Status On-going     PT LONG TERM GOAL #4   Title Pt will decrease TUG by at least 10 seconds in order to demonstrate decreased fall risk    Baseline 06/17/16: 22.7 seconds; 08/15/16: 20.3sec   Time 6   Period Weeks   Status On-going     PT LONG TERM GOAL #5   Title Pt  will decrease 5TSTS to <16 seconds in a standard chair in order to demonstrate clinically significant improvement in LE strength   Baseline 06/17/16: 40 seconds; 08/15/2016: 18sec from raised table 10/07/16: 12 sec   Time 8  Period Weeks   Status On-going               Plan - 10/23/2016 1257    Clinical Impression Statement Patient demonstrates improvement in all long terms goals indicating functional improvement in standing balance and walking. Although patient is improving, she continues to demonstrate decreased balance as indicated by decreased BERG, TUG, and ABC. Patient will benefit from further skilled therapy to return to prior level of function.    Rehab Potential Fair   Clinical Impairments Affecting Rehab Potential Positive: motivation; Negative: age, chronicity, bilateral knee pain   PT Frequency 2x / week   PT Duration 8 weeks   PT Treatment/Interventions ADLs/Self Care Home Management;Aquatic Therapy;Canalith Repostioning;Cryotherapy;Electrical Stimulation;Iontophoresis 4mg /ml Dexamethasone;Moist Heat;Traction;Ultrasound;DME Instruction;Gait training;Stair training;Functional mobility training;Therapeutic activities;Therapeutic exercise;Balance training;Neuromuscular re-education;Patient/family education;Wheelchair mobility training;Manual techniques;Passive range of motion;Vestibular   PT Next Visit Plan Progress balance and strengthening. Plan for transition to main clinic in order to utilize parallel bars   PT Home Exercise Plan Seated/supine glut sets, seated adduction squeezes, seated resisted abduction and/or clams, seated/supine hip flexion marching   Consulted and Agree with Plan of Care Patient;Family member/caregiver   Family Member Consulted Daughter      Patient will benefit from skilled therapeutic intervention in order to improve the following deficits and impairments:  Abnormal gait, Decreased balance, Decreased strength, Difficulty walking, Obesity  Visit  Diagnosis: Difficulty in walking, not elsewhere classified  Muscle weakness (generalized)  Unsteadiness on feet  History of falling       G-Codes - 10/23/2016 1259    Functional Assessment Tool Used (Outpatient Only) clinical judgement, 2m gait speed, 5TSTS, BERG, TUG, ABC, LEFS   Functional Limitation Mobility: Walking and moving around   Mobility: Walking and Moving Around Current Status [phone]) At least 40 percent but less than 60 percent impaired, limited or restricted   Mobility: Walking and Moving Around Goal Status [phone]) At least 40 percent but less than 60 percent impaired, limited or restricted      Problem List Patient Active Problem List   Diagnosis Date Noted  . Hemorrhoids 05/17/2016  . Obesity (BMI 30-39.9) 04/26/2016  . Multiple lung nodules 04/19/2016  . Chest pain 04/17/2016  . Partial symptomatic epilepsy with complex partial seizures, not intractable, without status epilepticus ([place]) 06/30/2015  . Bronchiectasis without complication ([place]) 41/42/3953  . Imbalance 11/08/2014  . Essential (primary) hypertension 11/08/2014  . Personal history of malignant neoplasm of breast 11/08/2014  . History of malignant carcinoid tumor of bronchus and lung 11/08/2014  . H/O peptic ulcer 11/08/2014  . Asthma, mild intermittent 11/08/2014  . PE (pulmonary embolism) 11/08/2014  . Gonalgia 11/08/2014  . Leg varices 11/08/2014    Blythe Stanford, PT DPT Oct 23, 2016, 1:00 PM  [place] MAIN [facility] SERVICES [address] [place], [place], [address] Phone: [phone]   Fax:  [phone]  Name: ARMETTA HENRI MRN: [id number] Date of Birth: 1924/12/31

## 2016-10-10 ENCOUNTER — Ambulatory Visit: Payer: Medicare Other

## 2016-10-10 DIAGNOSIS — R262 Difficulty in walking, not elsewhere classified: Secondary | ICD-10-CM | POA: Diagnosis not present

## 2016-10-10 DIAGNOSIS — Z9181 History of falling: Secondary | ICD-10-CM | POA: Diagnosis not present

## 2016-10-10 DIAGNOSIS — M6281 Muscle weakness (generalized): Secondary | ICD-10-CM | POA: Diagnosis not present

## 2016-10-10 DIAGNOSIS — R2681 Unsteadiness on feet: Secondary | ICD-10-CM

## 2016-10-10 NOTE — Therapy (Signed)
[place] MAIN [facility] SERVICES [address] [place], [place], [address] Phone: [phone]   Fax:  [phone]  Physical Therapy Treatment  Patient Details  Name: Rebecca Wolf MRN: [id number] Date of Birth: June 10, 1925 Referring Provider: Oneita Kras  Encounter Date: 10/10/2016      PT End of Session - 10/10/16 1211    Visit Number 21   Number of Visits 30   Date for PT Re-Evaluation 07-Nov-2016   Authorization Type g codes 1/10   PT Start Time 10-07-06   PT Stop Time 1146   PT Time Calculation (min) 38 min   Equipment Utilized During Treatment Gait belt   Activity Tolerance Patient tolerated treatment well   Behavior During Therapy [facility] for tasks assessed/performed      Past Medical History:  Diagnosis Date  . Asthma   . Breast cancer ([place])    06-Oct-2001 and 10-07-2002, radiation and chemo for 10-07-2002 breast cancer  . Carcinoid tumor determined by biopsy of lung October 07, 1999  . GERD (gastroesophageal reflux disease)   . Hemorrhoids   . Hypertension   . Polyp of colon 10/06/00   bleeding polyp of right ascending colon  . PUD (peptic ulcer disease)   . Seizures ([place])    epilepsy well controlled    Past Surgical History:  Procedure Laterality Date  . BREAST EXCISIONAL BIOPSY Left 2001-10-06   positive  . BREAST EXCISIONAL BIOPSY Left 10-07-02   positive  . BREAST LUMPECTOMY Left 2004/2005   cancer  . HEMICOLECTOMY Right    bowel obstruction  . HEMORRHOID SURGERY    . THORACOTOMY/LOBECTOMY  1999   carcinoid tumor  . TOTAL VAGINAL HYSTERECTOMY    . VARICOSE VEIN SURGERY      There were no vitals filed for this visit.      Subjective Assessment - 10/10/16 1136    Subjective Patient reports no major changes since the previous visit and states her legs are feeling tired and would like to work on walking.    Patient is accompained by: --  Daughter   Pertinent History Pt reports that she has difficulty performing sit to stand from chairs and trouble with her  balance. She also reports that her knees have been "locking" up on her over the last couple weeks. She has been using a rollator for approximately 10 years to help with her balance. She has had approximatlely 3 falls over the last 12 months. All of the falls have occurred when she was not using her walker.    Limitations Walking   Patient Stated Goals Improve balance and strength with transfers   Currently in Pain? No/denies   Pain Onset More than a month ago      TREATMENT:  Nustep with cueing on speed and to perform throughout full AROM - 45min; level 4 LAQ in sitting -2 x 10 with 5# on B ankles Standing Marches with hand held support from PT - x 15 B Ambulation around gym -- ~73ft with cueing to increase step length and heel strike Standing and stepping over half foam rollers - 2 x 5 Walking and kicking balance stones to address decreased step length and Single limb support - 2x 74ft Seated marches with 5# weights around ankles - 2 x10 Serpentine pattern around cones - x 69ft Seated SLR's B - 2 x 10  Seated heel/toe raises - 2 x 10  Seated hip abduction with legs straight - 2 x 20 Resisted Leg press in sitting -  x 10 B        PT Education - 10/10/16 1142    Education provided Yes   Education Details Form and technique with exercise   Person(s) Educated Patient   Methods Explanation;Demonstration   Comprehension Verbalized understanding;Returned demonstration             PT Long Term Goals - 10/07/16 1117      PT LONG TERM GOAL #1   Title Pt will be independent with HEP in order to improve strength and balance in order to decrease fall risk and improve function at home and work   Baseline 08/15/16: Moderate cueing on form/technique 10/07/16: Requires min cueing on technique and form   Time 6   Period Weeks   Status On-going     PT LONG TERM GOAL #2   Title Pt will improve ABC by at least 13% in order to demonstrate clinically significant improvement in balance  confidence.    Baseline 06/17/16: 43.75% 08/15/16: 55% 10/07/16: 56.9%   Time 6   Period Weeks   Status Achieved     PT LONG TERM GOAL #3   Title Pt will improve BERG to >30 points in order to demonstrate clinically significant improvement in balance.     Baseline 06/17/16: 17/56; 08/15/16: 24/56   Time 6   Period Weeks   Status On-going     PT LONG TERM GOAL #4   Title Pt will decrease TUG by at least 10 seconds in order to demonstrate decreased fall risk    Baseline 06/17/16: 22.7 seconds; 08/15/16: 20.3sec   Time 6   Period Weeks   Status On-going     PT LONG TERM GOAL #5   Title Pt will decrease 5TSTS to <16 seconds in a standard chair in order to demonstrate clinically significant improvement in LE strength   Baseline 06/17/16: 40 seconds; 08/15/2016: 18sec from raised table 10/07/16: 12 sec   Time 8   Period Weeks   Status On-going               Plan - 10/10/16 1251    Clinical Impression Statement Patient demonstrates improvement in walking ability with improved foot clearance, heel strike, and speed with gait today indicating functional carryover. Although patient is improving, she continues to demonstrates decreased ability to lift foot over objects in standing and will benefit from further skilled therapy to return to prior level of function.    Rehab Potential Fair   Clinical Impairments Affecting Rehab Potential Positive: motivation; Negative: age, chronicity, bilateral knee pain   PT Frequency 2x / week   PT Duration 8 weeks   PT Treatment/Interventions ADLs/Self Care Home Management;Aquatic Therapy;Canalith Repostioning;Cryotherapy;Electrical Stimulation;Iontophoresis 4mg /ml Dexamethasone;Moist Heat;Traction;Ultrasound;DME Instruction;Gait training;Stair training;Functional mobility training;Therapeutic activities;Therapeutic exercise;Balance training;Neuromuscular re-education;Patient/family education;Wheelchair mobility training;Manual techniques;Passive range of  motion;Vestibular   PT Next Visit Plan Progress balance and strengthening. Plan for transition to main clinic in order to utilize parallel bars   PT Home Exercise Plan Seated/supine glut sets, seated adduction squeezes, seated resisted abduction and/or clams, seated/supine hip flexion marching   Consulted and Agree with Plan of Care Patient;Family member/caregiver   Family Member Consulted Daughter      Patient will benefit from skilled therapeutic intervention in order to improve the following deficits and impairments:  Abnormal gait, Decreased balance, Decreased strength, Difficulty walking, Obesity  Visit Diagnosis: Difficulty in walking, not elsewhere classified  Muscle weakness (generalized)  Unsteadiness on feet     Problem List Patient Active Problem List  Diagnosis Date Noted  . Hemorrhoids 05/17/2016  . Obesity (BMI 30-39.9) 04/26/2016  . Multiple lung nodules 04/19/2016  . Chest pain 04/17/2016  . Partial symptomatic epilepsy with complex partial seizures, not intractable, without status epilepticus ([place]) 06/30/2015  . Bronchiectasis without complication ([place]) 06/15/2445  . Imbalance 11/08/2014  . Essential (primary) hypertension 11/08/2014  . Personal history of malignant neoplasm of breast 11/08/2014  . History of malignant carcinoid tumor of bronchus and lung 11/08/2014  . H/O peptic ulcer 11/08/2014  . Asthma, mild intermittent 11/08/2014  . PE (pulmonary embolism) 11/08/2014  . Gonalgia 11/08/2014  . Leg varices 11/08/2014    Blythe Stanford, PT DPT 10/10/2016, 12:54 PM  [place] MAIN [facility] SERVICES [address] [place], [place], [address] Phone: [phone]   Fax:  [phone]  Name: REIKO VINJE MRN: [id number] Date of Birth: [birth year]

## 2016-10-15 ENCOUNTER — Ambulatory Visit: Payer: Medicare Other

## 2016-10-15 DIAGNOSIS — R2681 Unsteadiness on feet: Secondary | ICD-10-CM | POA: Diagnosis not present

## 2016-10-15 DIAGNOSIS — M6281 Muscle weakness (generalized): Secondary | ICD-10-CM | POA: Diagnosis not present

## 2016-10-15 DIAGNOSIS — R262 Difficulty in walking, not elsewhere classified: Secondary | ICD-10-CM

## 2016-10-15 DIAGNOSIS — Z9181 History of falling: Secondary | ICD-10-CM | POA: Diagnosis not present

## 2016-10-15 NOTE — Therapy (Signed)
[place] MAIN [facility] SERVICES [address] [place], [place], [address] Phone: [phone]   Fax:  [phone]  Physical Therapy Treatment  Patient Details  Name: Rebecca Wolf MRN: [id number] Date of Birth: Jul 15, 1924 Referring Provider: Oneita Kras  Encounter Date: 10/15/2016      PT End of Session - 10/15/16 1350    Visit Number 22   Number of Visits 30   Date for PT Re-Evaluation Nov 24, 2016   Authorization Type g codes 09-05-22   PT Start Time 1300   PT Stop Time 1342   PT Time Calculation (min) 42 min   Equipment Utilized During Treatment Gait belt   Activity Tolerance Patient tolerated treatment well   Behavior During Therapy [facility] for tasks assessed/performed      Past Medical History:  Diagnosis Date  . Asthma   . Breast cancer ([place])    2003 and 2004, radiation and chemo for 2004 breast cancer  . 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  . GERD (gastroesophageal reflux disease)   . Hemorrhoids   . Hypertension   . Polyp of colon 2002   bleeding polyp of right ascending colon  . PUD (peptic ulcer disease)   . Seizures ([place])    epilepsy well controlled    Past Surgical History:  Procedure Laterality Date  . BREAST EXCISIONAL BIOPSY Left 2003   positive  . BREAST EXCISIONAL BIOPSY Left 2004   positive  . BREAST LUMPECTOMY Left 2004/2005   cancer  . HEMICOLECTOMY Right    bowel obstruction  . HEMORRHOID SURGERY    . THORACOTOMY/LOBECTOMY  1999   carcinoid tumor  . TOTAL VAGINAL HYSTERECTOMY    . VARICOSE VEIN SURGERY      There were no vitals filed for this visit.      Subjective Assessment - 10/15/16 1316    Subjective Patient reports she was able to walk [place] over the weekend and was able to amb .64miles before requiring a sitting rest break.    Patient is accompained by: --  Daughter   Pertinent History Pt reports that she has difficulty performing sit to stand from chairs and trouble  with her balance. She also reports that her knees have been "locking" up on her over the last couple weeks. She has been using a rollator for approximately 10 years to help with her balance. She has had approximatlely 3 falls over the last 12 months. All of the falls have occurred when she was not using her walker.    Limitations Walking   Patient Stated Goals Improve balance and strength with transfers   Currently in Pain? No/denies   Pain Onset More than a month ago      TREATMENT:  Nustep with cueing on speed and to perform throughout full AROM - 52min; level 4 Side stepping across airex beam - x5 down and back  Forward/backward stepping over half foam rollers - 2 x 10 B  Ball kicks in standing - 2 x 10 with B LE's Balloon hits in standing with wide BOS --  3 x 10  Seated marches with 5# weights around ankles - 2 x10 LAQ in sitting -2 x 10 with 5# on B ankles Resisted Leg press in sitting -x 10 B        PT Education - 10/15/16 1340    Education provided Yes   Education Details Form and technique with exercise    Person(s) Educated Patient  Methods Explanation;Demonstration   Comprehension Verbalized understanding;Returned demonstration             PT Long Term Goals - 10/07/16 1117      PT LONG TERM GOAL #1   Title Pt will be independent with HEP in order to improve strength and balance in order to decrease fall risk and improve function at home and work   Baseline 08/15/16: Moderate cueing on form/technique 10/07/16: Requires min cueing on technique and form   Time 6   Period Weeks   Status On-going     PT LONG TERM GOAL #2   Title Pt will improve ABC by at least 13% in order to demonstrate clinically significant improvement in balance confidence.    Baseline 06/17/16: 43.75% 08/15/16: 55% 10/07/16: 56.9%   Time 6   Period Weeks   Status Achieved     PT LONG TERM GOAL #3   Title Pt will improve BERG to >30 points in order to demonstrate clinically significant  improvement in balance.     Baseline 06/17/16: 17/56; 08/15/16: 24/56   Time 6   Period Weeks   Status On-going     PT LONG TERM GOAL #4   Title Pt will decrease TUG by at least 10 seconds in order to demonstrate decreased fall risk    Baseline 06/17/16: 22.7 seconds; 08/15/16: 20.3sec   Time 6   Period Weeks   Status On-going     PT LONG TERM GOAL #5   Title Pt will decrease 5TSTS to <16 seconds in a standard chair in order to demonstrate clinically significant improvement in LE strength   Baseline 06/17/16: 40 seconds; 08/15/2016: 18sec from raised table 10/07/16: 12 sec   Time 8   Period Weeks   Status On-going               Plan - 10/15/16 1351    Clinical Impression Statement Patient demonstrates improved standing stabilization and balance today requiring fewer standing rest breaks throughout session. Although patient is improving, she continues to demonstrate decreased LE strength and endurance throughout session and will benefit from further skilled therapy to return to prior level of function.    Rehab Potential Fair   Clinical Impairments Affecting Rehab Potential Positive: motivation; Negative: age, chronicity, bilateral knee pain   PT Frequency 2x / week   PT Duration 8 weeks   PT Treatment/Interventions ADLs/Self Care Home Management;Aquatic Therapy;Canalith Repostioning;Cryotherapy;Electrical Stimulation;Iontophoresis 4mg /ml Dexamethasone;Moist Heat;Traction;Ultrasound;DME Instruction;Gait training;Stair training;Functional mobility training;Therapeutic activities;Therapeutic exercise;Balance training;Neuromuscular re-education;Patient/family education;Wheelchair mobility training;Manual techniques;Passive range of motion;Vestibular   PT Next Visit Plan Progress balance and strengthening. Plan for transition to main clinic in order to utilize parallel bars   PT Home Exercise Plan Seated/supine glut sets, seated adduction squeezes, seated resisted abduction and/or clams,  seated/supine hip flexion marching   Consulted and Agree with Plan of Care Patient;Family member/caregiver   Family Member Consulted Daughter      Patient will benefit from skilled therapeutic intervention in order to improve the following deficits and impairments:  Abnormal gait, Decreased balance, Decreased strength, Difficulty walking, Obesity  Visit Diagnosis: Difficulty in walking, not elsewhere classified  Muscle weakness (generalized)  Unsteadiness on feet     Problem List Patient Active Problem List   Diagnosis Date Noted  . Hemorrhoids 05/17/2016  . Obesity (BMI 30-39.9) 04/26/2016  . Multiple lung nodules 04/19/2016  . Chest pain 04/17/2016  . Partial symptomatic epilepsy with complex partial seizures, not intractable, without status epilepticus ([place]) 06/30/2015  . Bronchiectasis  without complication ([place]) 83/72/9021  . Imbalance 11/08/2014  . Essential (primary) hypertension 11/08/2014  . Personal history of malignant neoplasm of breast 11/08/2014  . History of malignant carcinoid tumor of bronchus and lung 11/08/2014  . H/O peptic ulcer 11/08/2014  . Asthma, mild intermittent 11/08/2014  . PE (pulmonary embolism) 11/08/2014  . Gonalgia 11/08/2014  . Leg varices 11/08/2014    Blythe Stanford, PT DPT 10/15/2016, 1:57 PM  [place] MAIN [facility] SERVICES [address] [place], [place], [address] Phone: [phone]   Fax:  [phone]  Name: TAMINA CYPHERS MRN: [id number] Date of Birth: August 18, 1924

## 2016-10-16 ENCOUNTER — Ambulatory Visit
Admission: RE | Admit: 2016-10-16 | Discharge: 2016-10-16 | Disposition: A | Discharge status: Home / Self Care | Payer: Medicare Other | Source: Ambulatory Visit | Attending: Hematology and Oncology | Admitting: Hematology and Oncology

## 2016-10-16 DIAGNOSIS — Z1231 Encounter for screening mammogram for malignant neoplasm of breast: Secondary | ICD-10-CM | POA: Diagnosis not present

## 2016-10-16 HISTORY — DX: Personal history of antineoplastic chemotherapy: Z92.21

## 2016-10-16 HISTORY — DX: Personal history of irradiation: Z92.3

## 2016-10-17 ENCOUNTER — Ambulatory Visit: Payer: Medicare Other

## 2016-10-17 DIAGNOSIS — M6281 Muscle weakness (generalized): Secondary | ICD-10-CM

## 2016-10-17 DIAGNOSIS — R262 Difficulty in walking, not elsewhere classified: Secondary | ICD-10-CM | POA: Diagnosis not present

## 2016-10-17 DIAGNOSIS — R2681 Unsteadiness on feet: Secondary | ICD-10-CM | POA: Diagnosis not present

## 2016-10-17 DIAGNOSIS — Z9181 History of falling: Secondary | ICD-10-CM | POA: Diagnosis not present

## 2016-10-17 NOTE — Therapy (Signed)
[place] MAIN [facility] SERVICES [address] [place], [place], [address] Phone: [phone]   Fax:  [phone]  Physical Therapy Treatment  Patient Details  Name: Rebecca Wolf MRN: [id number] Date of Birth: Jul 03, 1924 Referring Provider: Oneita Kras  Encounter Date: 10/17/2016      PT End of Session - 10/17/16 3754    Visit Number 23   Number of Visits 30   Date for PT Re-Evaluation Nov 23, 2016   Authorization Type g codes 10-03-22   PT Start Time 1100   PT Stop Time 1145   PT Time Calculation (min) 45 min   Equipment Utilized During Treatment Gait belt   Activity Tolerance Patient tolerated treatment well   Behavior During Therapy [facility] for tasks assessed/performed      Past Medical History:  Diagnosis Date  . Asthma   . Breast cancer ([place]) 2003   LT LUMPECTOMY  . Breast cancer ([place]) 2004   LT LUMPECTOMY  . 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  . GERD (gastroesophageal reflux disease)   . Hemorrhoids   . Hypertension   . Personal history of chemotherapy 2004   BREAST CA  . Personal history of radiation therapy 2003   BREAST CA  . Personal history of radiation therapy 2004   BREAST CA  . Polyp of colon 2002   bleeding polyp of right ascending colon  . PUD (peptic ulcer disease)   . Seizures ([place])    epilepsy well controlled    Past Surgical History:  Procedure Laterality Date  . BREAST EXCISIONAL BIOPSY Left 2003   positive  . BREAST EXCISIONAL BIOPSY Left 2004   positive  . BREAST LUMPECTOMY Left 2003   BREAST CA  . BREAST LUMPECTOMY Left 2004   BREAST CA  . HEMICOLECTOMY Right    bowel obstruction  . HEMORRHOID SURGERY    . THORACOTOMY/LOBECTOMY  1999   carcinoid tumor  . TOTAL VAGINAL HYSTERECTOMY    . VARICOSE VEIN SURGERY      There were no vitals filed for this visit.      Subjective Assessment - 10/17/16 1203    Subjective Patient reports no major changes since the previous visit. patient  rpeorts she would like to work on balancing while in standing.    Patient is accompained by: --  Daughter   Pertinent History Pt reports that she has difficulty performing sit to stand from chairs and trouble with her balance. She also reports that her knees have been "locking" up on her over the last couple weeks. She has been using a rollator for approximately 10 years to help with her balance. She has had approximatlely 3 falls over the last 12 months. All of the falls have occurred when she was not using her walker.    Limitations Walking   Patient Stated Goals Improve balance and strength with transfers   Currently in Pain? No/denies   Pain Onset More than a month ago      TREATMENT:  Nustep with cueing on speed and to perform throughout full AROM - 2min; level 4 Balloon hits in standing with wide BOS --  2 x 20 Forward and backward tandem amb on airex beam - x5 down and back Side stepping across airex beam - x5 down and back  Seated marches with 7# weights around ankles - 3 x10 LAQ in sitting -3 x 10 with 7# on B ankles Resisted Leg press in sitting - 2 x 10 B  PT Education - 10/17/16 1204    Education provided Yes   Education Details form/technique with exercise   Person(s) Educated Patient   Methods Explanation;Demonstration   Comprehension Verbalized understanding;Returned demonstration             PT Long Term Goals - 10/07/16 1117      PT LONG TERM GOAL #1   Title Pt will be independent with HEP in order to improve strength and balance in order to decrease fall risk and improve function at home and work   Baseline 08/15/16: Moderate cueing on form/technique 10/07/16: Requires min cueing on technique and form   Time 6   Period Weeks   Status On-going     PT LONG TERM GOAL #2   Title Pt will improve ABC by at least 13% in order to demonstrate clinically significant improvement in balance confidence.    Baseline 06/17/16: 43.75% 08/15/16: 55% 10/07/16: 56.9%    Time 6   Period Weeks   Status Achieved     PT LONG TERM GOAL #3   Title Pt will improve BERG to >30 points in order to demonstrate clinically significant improvement in balance.     Baseline 06/17/16: 17/56; 08/15/16: 24/56   Time 6   Period Weeks   Status On-going     PT LONG TERM GOAL #4   Title Pt will decrease TUG by at least 10 seconds in order to demonstrate decreased fall risk    Baseline 06/17/16: 22.7 seconds; 08/15/16: 20.3sec   Time 6   Period Weeks   Status On-going     PT LONG TERM GOAL #5   Title Pt will decrease 5TSTS to <16 seconds in a standard chair in order to demonstrate clinically significant improvement in LE strength   Baseline 06/17/16: 40 seconds; 08/15/2016: 18sec from raised table 10/07/16: 12 sec   Time 8   Period Weeks   Status On-going               Plan - 10/17/16 1246    Clinical Impression Statement Patient demonstrates decreased standing tolerance today as indicated by greater amount of sitting breaks throughout the session. Patient demonstrates increased postural sway with exercise indicating decreased static balance and patient will benefit from further skilled therapy to return to prior level of function.    Rehab Potential Fair   Clinical Impairments Affecting Rehab Potential Positive: motivation; Negative: age, chronicity, bilateral knee pain   PT Frequency 2x / week   PT Duration 8 weeks   PT Treatment/Interventions ADLs/Self Care Home Management;Aquatic Therapy;Canalith Repostioning;Cryotherapy;Electrical Stimulation;Iontophoresis 4mg /ml Dexamethasone;Moist Heat;Traction;Ultrasound;DME Instruction;Gait training;Stair training;Functional mobility training;Therapeutic activities;Therapeutic exercise;Balance training;Neuromuscular re-education;Patient/family education;Wheelchair mobility training;Manual techniques;Passive range of motion;Vestibular   PT Next Visit Plan Progress balance and strengthening. Plan for transition to main clinic in  order to utilize parallel bars   PT Home Exercise Plan Seated/supine glut sets, seated adduction squeezes, seated resisted abduction and/or clams, seated/supine hip flexion marching   Consulted and Agree with Plan of Care Patient;Family member/caregiver   Family Member Consulted Daughter      Patient will benefit from skilled therapeutic intervention in order to improve the following deficits and impairments:  Abnormal gait, Decreased balance, Decreased strength, Difficulty walking, Obesity  Visit Diagnosis: Difficulty in walking, not elsewhere classified  Muscle weakness (generalized)  Unsteadiness on feet  History of falling     Problem List Patient Active Problem List   Diagnosis Date Noted  . Hemorrhoids 05/17/2016  . Obesity (BMI 30-39.9) 04/26/2016  .  Multiple lung nodules 04/19/2016  . Chest pain 04/17/2016  . Partial symptomatic epilepsy with complex partial seizures, not intractable, without status epilepticus ([place]) 06/30/2015  . Bronchiectasis without complication ([place]) 01/74/9449  . Imbalance 11/08/2014  . Essential (primary) hypertension 11/08/2014  . Personal history of malignant neoplasm of breast 11/08/2014  . History of malignant carcinoid tumor of bronchus and lung 11/08/2014  . H/O peptic ulcer 11/08/2014  . Asthma, mild intermittent 11/08/2014  . PE (pulmonary embolism) 11/08/2014  . Gonalgia 11/08/2014  . Leg varices 11/08/2014    Blythe Stanford, PT DPT 10/17/2016, 12:56 PM  [place] MAIN [facility] SERVICES [address] [place], [place], [address] Phone: [phone]   Fax:  [phone]  Name: Rebecca Wolf MRN: [id number] Date of Birth: 05-12-1925

## 2016-10-18 ENCOUNTER — Other Ambulatory Visit: Payer: Self-pay | Admitting: Internal Medicine

## 2016-10-21 ENCOUNTER — Ambulatory Visit: Payer: Medicare Other | Admitting: Physical Therapy

## 2016-10-21 VITALS — BP 127/73 | HR 76

## 2016-10-21 DIAGNOSIS — R2681 Unsteadiness on feet: Secondary | ICD-10-CM

## 2016-10-21 DIAGNOSIS — M6281 Muscle weakness (generalized): Secondary | ICD-10-CM

## 2016-10-21 DIAGNOSIS — R262 Difficulty in walking, not elsewhere classified: Secondary | ICD-10-CM | POA: Diagnosis not present

## 2016-10-21 DIAGNOSIS — Z9181 History of falling: Secondary | ICD-10-CM

## 2016-10-21 NOTE — Therapy (Signed)
[place] MAIN [facility] SERVICES [address] [place], [place], [address] Phone: [phone]   Fax:  [phone]  Physical Therapy Treatment  Patient Details  Name: Rebecca Wolf MRN: [id number] Date of Birth: 11-Jul-1924 Referring Provider: Oneita Kras  Encounter Date: 10/21/2016      PT End of Session - 10/21/16 1310    Visit Number 24   Number of Visits 30   Date for PT Re-Evaluation 11/26/16   Authorization Type g codes 2022/11/06   PT Start Time 1308   PT Stop Time 1345   PT Time Calculation (min) 37 min   Equipment Utilized During Treatment Gait belt   Activity Tolerance Patient tolerated treatment well   Behavior During Therapy [facility] for tasks assessed/performed      Past Medical History:  Diagnosis Date  . Asthma   . Breast cancer ([place]) 2003   LT LUMPECTOMY  . Breast cancer ([place]) 2004   LT LUMPECTOMY  . 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  . GERD (gastroesophageal reflux disease)   . Hemorrhoids   . Hypertension   . Personal history of chemotherapy 2004   BREAST CA  . Personal history of radiation therapy 2003   BREAST CA  . Personal history of radiation therapy 2004   BREAST CA  . Polyp of colon 2002   bleeding polyp of right ascending colon  . PUD (peptic ulcer disease)   . Seizures ([place])    epilepsy well controlled    Past Surgical History:  Procedure Laterality Date  . BREAST EXCISIONAL BIOPSY Left 2003   positive  . BREAST EXCISIONAL BIOPSY Left 2004   positive  . BREAST LUMPECTOMY Left 2003   BREAST CA  . BREAST LUMPECTOMY Left 2004   BREAST CA  . HEMICOLECTOMY Right    bowel obstruction  . HEMORRHOID SURGERY    . THORACOTOMY/LOBECTOMY  1999   carcinoid tumor  . TOTAL VAGINAL HYSTERECTOMY    . VARICOSE VEIN SURGERY      Vitals:   10/21/16 1316  BP: 127/73  Pulse: 76        Subjective Assessment - 10/21/16 1314    Subjective Pt arrived late, limiting session.  Pt reports no new  changes since last session.  She denies pain and reports she is doing well this date.  Reports she is able to ambulate inside her home without AD.   Patient is accompained by: --  Daughter   Pertinent History Pt reports that she has difficulty performing sit to stand from chairs and trouble with her balance. She also reports that her knees have been "locking" up on her over the last couple weeks. She has been using a rollator for approximately 10 years to help with her balance. She has had approximatlely 3 falls over the last 12 months. All of the falls have occurred when she was not using her walker.    Limitations Walking   Patient Stated Goals Improve balance and strength with transfers   Currently in Pain? No/denies        TREATMENT:   Therapeutic Exercise:   Rhomberg stance on airex. Pt performs in 10 second bouts as she fatigues quickly and requires BUE support to rest between reps. x5  Forward and backward tandem amb on airex beam - x5 down and back  Side stepping across airex beam - x5 down and back  Seated marches with 7# weights around ankles - 3 x10  LAQ in sitting -  3 x 10 with 7# on B ankles  Holding onto // bar with LUE and taking a step forward with RLE and reaching RUE up with 1# weight x10. Repeated, taking step with LLE x10. This is challenging for the pt. Cues to take bigger steps and exaggerate reaching.  Marching in standing 2x10 each LE with 1 UE supported. Pt fatigues quickly and requires rest break between each set. Pt refuses to attempt without UE support despite education provided on the benefits of challenging her balance in a safe environment.           PT Education - 10/21/16 1310    Education provided Yes   Education Details Exercise technique   Person(s) Educated Patient   Methods Explanation;Demonstration   Comprehension Verbalized understanding;Returned demonstration;Need further instruction             PT Long Term Goals - 10/07/16 1117       PT LONG TERM GOAL #1   Title Pt will be independent with HEP in order to improve strength and balance in order to decrease fall risk and improve function at home and work   Baseline 08/15/16: Moderate cueing on form/technique 10/07/16: Requires min cueing on technique and form   Time 6   Period Weeks   Status On-going     PT LONG TERM GOAL #2   Title Pt will improve ABC by at least 13% in order to demonstrate clinically significant improvement in balance confidence.    Baseline 06/17/16: 43.75% 08/15/16: 55% 10/07/16: 56.9%   Time 6   Period Weeks   Status Achieved     PT LONG TERM GOAL #3   Title Pt will improve BERG to >30 points in order to demonstrate clinically significant improvement in balance.     Baseline 06/17/16: 17/56; 08/15/16: 24/56   Time 6   Period Weeks   Status On-going     PT LONG TERM GOAL #4   Title Pt will decrease TUG by at least 10 seconds in order to demonstrate decreased fall risk    Baseline 06/17/16: 22.7 seconds; 08/15/16: 20.3sec   Time 6   Period Weeks   Status On-going     PT LONG TERM GOAL #5   Title Pt will decrease 5TSTS to <16 seconds in a standard chair in order to demonstrate clinically significant improvement in LE strength   Baseline 06/17/16: 40 seconds; 08/15/2016: 18sec from raised table 10/07/16: 12 sec   Time 8   Period Weeks   Status On-going               Plan - 10/21/16 1327    Clinical Impression Statement Pt presents to PT session agitated and at times question her level of effort, but tolerated all interventions well.  She fatigues very quickly with all activities today, requiring frequent rest breaks. As pt reports her main goal "to walk better", exercises were focused on simulating ambulation in her home without AD.  Pt will benefit from continued skilled PT interventions for improved endurance, strength, gait mechanics, and QOL.   Rehab Potential Fair   Clinical Impairments Affecting Rehab Potential Positive: motivation;  Negative: age, chronicity, bilateral knee pain   PT Frequency 2x / week   PT Duration 8 weeks   PT Treatment/Interventions ADLs/Self Care Home Management;Aquatic Therapy;Canalith Repostioning;Cryotherapy;Electrical Stimulation;Iontophoresis 4mg /ml Dexamethasone;Moist Heat;Traction;Ultrasound;DME Instruction;Gait training;Stair training;Functional mobility training;Therapeutic activities;Therapeutic exercise;Balance training;Neuromuscular re-education;Patient/family education;Wheelchair mobility training;Manual techniques;Passive range of motion;Vestibular   PT Next Visit Plan Progress balance and strengthening. Plan  for transition to main clinic in order to utilize parallel bars   PT Home Exercise Plan Seated/supine glut sets, seated adduction squeezes, seated resisted abduction and/or clams, seated/supine hip flexion marching   Consulted and Agree with Plan of Care Patient;Family member/caregiver   Family Member Consulted Daughter      Patient will benefit from skilled therapeutic intervention in order to improve the following deficits and impairments:  Abnormal gait, Decreased balance, Decreased strength, Difficulty walking, Obesity  Visit Diagnosis: Difficulty in walking, not elsewhere classified  Muscle weakness (generalized)  Unsteadiness on feet  History of falling     Problem List Patient Active Problem List   Diagnosis Date Noted  . Hemorrhoids 05/17/2016  . Obesity (BMI 30-39.9) 04/26/2016  . Multiple lung nodules 04/19/2016  . Chest pain 04/17/2016  . Partial symptomatic epilepsy with complex partial seizures, not intractable, without status epilepticus ([place]) 06/30/2015  . Bronchiectasis without complication ([place]) 15/17/6160  . Imbalance 11/08/2014  . Essential (primary) hypertension 11/08/2014  . Personal history of malignant neoplasm of breast 11/08/2014  . History of malignant carcinoid tumor of bronchus and lung 11/08/2014  . H/O peptic ulcer 11/08/2014  . Asthma,  mild intermittent 11/08/2014  . PE (pulmonary embolism) 11/08/2014  . Gonalgia 11/08/2014  . Leg varices 11/08/2014     Collie Siad PT, DPT 10/21/2016, 1:46 PM  [place] MAIN [facility] SERVICES [address] [place], [place], [address] Phone: [phone]   Fax:  [phone]  Name: Rebecca Wolf MRN: [id number] Date of Birth: 1925/02/18

## 2016-10-24 DIAGNOSIS — G40209 Localization-related (focal) (partial) symptomatic epilepsy and epileptic syndromes with complex partial seizures, not intractable, without status epilepticus: Secondary | ICD-10-CM | POA: Diagnosis not present

## 2016-10-25 ENCOUNTER — Ambulatory Visit (INDEPENDENT_AMBULATORY_CARE_PROVIDER_SITE_OTHER): Payer: Medicare Other | Admitting: Podiatry

## 2016-10-25 ENCOUNTER — Encounter: Payer: Self-pay | Admitting: Podiatry

## 2016-10-25 DIAGNOSIS — M79672 Pain in left foot: Secondary | ICD-10-CM

## 2016-10-25 DIAGNOSIS — L851 Acquired keratosis [keratoderma] palmaris et plantaris: Secondary | ICD-10-CM | POA: Diagnosis not present

## 2016-10-25 DIAGNOSIS — L84 Corns and callosities: Secondary | ICD-10-CM | POA: Diagnosis not present

## 2016-10-25 DIAGNOSIS — M79671 Pain in right foot: Secondary | ICD-10-CM | POA: Diagnosis not present

## 2016-10-26 NOTE — Progress Notes (Signed)
   Subjective: Patient presents to the office today for chief complaint of a painful callus lesion of the lateral side of the left fifth toe that has been present for the past three weeks. Patient states that the pain is affecting her ability to ambulate without pain. She states physical therapy increases the pain. Patient presents today for further treatment and evaluation.  Objective:  Physical Exam General: Alert and oriented x3 in no acute distress  Dermatology: Hyperkeratotic lesion present on the lateral left fifth toe. Pain on palpation with a central nucleated core noted.  Skin is warm, dry and supple bilateral lower extremities. Negative for open lesions or macerations.  Vascular: Palpable pedal pulses bilaterally. No edema or erythema noted. Capillary refill within normal limits.  Neurological: Epicritic and protective threshold grossly intact bilaterally.   Musculoskeletal Exam: Pain on palpation at the keratotic lesion noted. Range of motion within normal limits bilateral. Muscle strength 5/5 in all groups bilateral.  Assessment: #1 callus left fifth toe   Plan of Care:  #1 Patient evaluated #2 Excisional debridement of keratoic lesion using a chisel blade was performed without incident.  #3 Treated area(s) with Salinocaine and dressed with light dressing. #4 Patient is to return to the clinic in 3 months.   Edrick Kins, DPM [facility]  Dr. Edrick Kins, [place]                                        [place], [place] [address]                Office [phone]  Fax [phone]     a

## 2016-10-27 ENCOUNTER — Other Ambulatory Visit: Payer: Self-pay | Admitting: Internal Medicine

## 2016-10-28 ENCOUNTER — Ambulatory Visit: Payer: Medicare Other

## 2016-10-28 DIAGNOSIS — R262 Difficulty in walking, not elsewhere classified: Secondary | ICD-10-CM

## 2016-10-28 DIAGNOSIS — M6281 Muscle weakness (generalized): Secondary | ICD-10-CM

## 2016-10-28 DIAGNOSIS — R2681 Unsteadiness on feet: Secondary | ICD-10-CM

## 2016-10-28 DIAGNOSIS — Z9181 History of falling: Secondary | ICD-10-CM | POA: Diagnosis not present

## 2016-10-28 NOTE — Therapy (Signed)
[place] [facility] [address] [place], [address] Phone: [phone]   Fax:  [phone]  Physical Therapy Treatment  Patient Details  Name: Rebecca Wolf MRN: [id number] Date of Birth: [birth year] Referring Provider: Oneita Kras  Encounter Date: 10/28/2016      PT End of Session - 10/28/16 1327    Visit Number 25   Number of Visits 30   Date for PT Re-Evaluation 06-Nov-2016   Authorization Type g codes 5/10   PT Start Time 1300   PT Stop Time 1345   PT Time Calculation (min) 45 min   Equipment Utilized During Treatment Gait belt   Activity Tolerance Patient tolerated treatment well   Behavior During Therapy [facility] for tasks assessed/performed      Past Medical History:  Diagnosis Date  . Asthma   . Breast cancer ([place]) 2003   LT LUMPECTOMY  . Breast cancer ([place]) 2004   LT LUMPECTOMY  . 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  . GERD (gastroesophageal reflux disease)   . Hemorrhoids   . Hypertension   . Personal history of chemotherapy 2004   BREAST CA  . Personal history of radiation therapy 2003   BREAST CA  . Personal history of radiation therapy 2004   BREAST CA  . Polyp of colon 2002   bleeding polyp of right ascending colon  . PUD (peptic ulcer disease)   . Seizures ([place])    epilepsy well controlled    Past Surgical History:  Procedure Laterality Date  . BREAST EXCISIONAL BIOPSY Left 2003   positive  . BREAST EXCISIONAL BIOPSY Left 2004   positive  . BREAST LUMPECTOMY Left 2003   BREAST CA  . BREAST LUMPECTOMY Left 2004   BREAST CA  . HEMICOLECTOMY Right    bowel obstruction  . HEMORRHOID SURGERY    . THORACOTOMY/LOBECTOMY  1999   carcinoid tumor  . TOTAL VAGINAL HYSTERECTOMY    . VARICOSE VEIN SURGERY      There were no vitals filed for this visit.      Subjective Assessment - 10/28/16 1318    Subjective Patinet report no major changes since the previous session.  Patient states she's been walking around [place] for exercise.    Patient is accompained by: --  Daughter   Pertinent History Pt reports that she has difficulty performing sit to stand from chairs and trouble with her balance. She also reports that her knees have been "locking" up on her over the last couple weeks. She has been using a rollator for approximately 10 years to help with her balance. She has had approximatlely 3 falls over the last 12 months. All of the falls have occurred when she was not using her walker.    Limitations Walking   Patient Stated Goals Improve balance and strength with transfers   Currently in Pain? No/denies      TREATMENT:  Nustep with cueing on speed and to perform throughout full AROM - 22min; level 4 One foot in front of the other walking across airex beam - x5 down and back Side stepping across airex beam - x5 down and back  Heel raises with B UE support - x30 Marches on airex pad - 2 x 10 Standing hip abduction with UE support-x15 Seated hip adduction/glute squeeze - 2 x 20  Standing mini squats - x20, x 10 with UE support - required cueing to perform throughout greater ROM throughout  LAQ in sitting - x 10 with 10# on B ankles       PT Education - 10/28/16 1327    Education provided Yes   Education Details educated to continue walking program   Person(s) Educated Patient   Methods Explanation;Demonstration   Comprehension Verbalized understanding;Returned demonstration             PT Long Term Goals - 10/07/16 1117      PT LONG TERM GOAL #1   Title Pt will be independent with HEP in order to improve strength and balance in order to decrease fall risk and improve function at home and work   Baseline 08/15/16: Moderate cueing on form/technique 10/07/16: Requires min cueing on technique and form   Time 6   Period Weeks   Status On-going     PT LONG TERM GOAL #2   Title Pt will improve ABC by at least 13% in order to demonstrate  clinically significant improvement in balance confidence.    Baseline 06/17/16: 43.75% 08/15/16: 55% 10/07/16: 56.9%   Time 6   Period Weeks   Status Achieved     PT LONG TERM GOAL #3   Title Pt will improve BERG to >30 points in order to demonstrate clinically significant improvement in balance.     Baseline 06/17/16: 17/56; 08/15/16: 24/56   Time 6   Period Weeks   Status On-going     PT LONG TERM GOAL #4   Title Pt will decrease TUG by at least 10 seconds in order to demonstrate decreased fall risk    Baseline 06/17/16: 22.7 seconds; 08/15/16: 20.3sec   Time 6   Period Weeks   Status On-going     PT LONG TERM GOAL #5   Title Pt will decrease 5TSTS to <16 seconds in a standard chair in order to demonstrate clinically significant improvement in LE strength   Baseline 06/17/16: 40 seconds; 08/15/2016: 18sec from raised table 10/07/16: 12 sec   Time 8   Period Weeks   Status On-going               Plan - 10/28/16 1332    Clinical Impression Statement Patient demonstrates increased fatigue when performing standing based exercise indicating decreased muscular endurance and strength. Although patient continues to demonstrate weakness she's able to perform greater amount of standing exercises indicating functional carryover between visits and patient will benefit from further skilled therapy to return to prior level of function.    Rehab Potential Fair   Clinical Impairments Affecting Rehab Potential Positive: motivation; Negative: age, chronicity, bilateral knee pain   PT Frequency 2x / week   PT Duration 8 weeks   PT Treatment/Interventions ADLs/Self Care Home Management;Aquatic Therapy;Canalith Repostioning;Cryotherapy;Electrical Stimulation;Iontophoresis 4mg /ml Dexamethasone;Moist Heat;Traction;Ultrasound;DME Instruction;Gait training;Stair training;Functional mobility training;Therapeutic activities;Therapeutic exercise;Balance training;Neuromuscular re-education;Patient/family  education;Wheelchair mobility training;Manual techniques;Passive range of motion;Vestibular   PT Next Visit Plan Progress balance and strengthening. Plan for transition to main clinic in order to utilize parallel bars   PT Home Exercise Plan Seated/supine glut sets, seated adduction squeezes, seated resisted abduction and/or clams, seated/supine hip flexion marching   Consulted and Agree with Plan of Care Patient;Family member/caregiver   Family Member Consulted Daughter      Patient will benefit from skilled therapeutic intervention in order to improve the following deficits and impairments:  Abnormal gait, Decreased balance, Decreased strength, Difficulty walking, Obesity  Visit Diagnosis: Muscle weakness (generalized)  Difficulty in walking, not elsewhere classified  Unsteadiness on feet     Problem  List Patient Active Problem List   Diagnosis Date Noted  . Hemorrhoids 05/17/2016  . Obesity (BMI 30-39.9) 04/26/2016  . Multiple lung nodules 04/19/2016  . Chest pain 04/17/2016  . Partial symptomatic epilepsy with complex partial seizures, not intractable, without status epilepticus ([place]) 06/30/2015  . Bronchiectasis without complication ([employer]) 65/53/7482  . Imbalance 11/08/2014  . Essential (primary) hypertension 11/08/2014  . Personal history of malignant neoplasm of breast 11/08/2014  . History of malignant carcinoid tumor of bronchus and lung 11/08/2014  . H/O peptic ulcer 11/08/2014  . Asthma, mild intermittent 11/08/2014  . PE (pulmonary embolism) 11/08/2014  . Gonalgia 11/08/2014  . Leg varices 11/08/2014    Blythe Stanford, PT DPT 10/28/2016, 1:41 PM  [place] [facility] [address] [place], [address] Phone: [phone]   Fax:  [phone]  Name: LIVVY SPILMAN MRN: [id number] Date of Birth: [birth year]

## 2016-10-31 ENCOUNTER — Ambulatory Visit: Payer: Medicare Other | Attending: Internal Medicine

## 2016-10-31 DIAGNOSIS — R2681 Unsteadiness on feet: Secondary | ICD-10-CM | POA: Insufficient documentation

## 2016-10-31 DIAGNOSIS — M6281 Muscle weakness (generalized): Secondary | ICD-10-CM | POA: Diagnosis not present

## 2016-10-31 DIAGNOSIS — R262 Difficulty in walking, not elsewhere classified: Secondary | ICD-10-CM | POA: Diagnosis not present

## 2016-10-31 NOTE — Therapy (Signed)
[facility] [address], [place], [address] Phone: [phone]   Fax:  [phone]  Physical Therapy Treatment  Patient Details  Name: Rebecca Wolf MRN: [id number] Date of Birth: 1924/09/16 Referring Provider: Oneita Kras  Encounter Date: 10/31/2016      PT End of Session - 10/31/16 1344    Visit Number 26   Number of Visits 30   Date for PT Re-Evaluation 2016/12/02   Authorization Type g codes 6/10   PT Start Time 1300   PT Stop Time 1345   PT Time Calculation (min) 45 min   Equipment Utilized During Treatment Gait belt   Activity Tolerance Patient tolerated treatment well   Behavior During Therapy [facility] for tasks assessed/performed      Past Medical History:  Diagnosis Date  . Asthma   . Breast cancer ([place]) 2003   LT LUMPECTOMY  . Breast cancer ([place]) 2004   LT LUMPECTOMY  . 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  . GERD (gastroesophageal reflux disease)   . Hemorrhoids   . Hypertension   . Personal history of chemotherapy 2004   BREAST CA  . Personal history of radiation therapy 2003   BREAST CA  . Personal history of radiation therapy 2004   BREAST CA  . Polyp of colon 2002   bleeding polyp of right ascending colon  . PUD (peptic ulcer disease)   . Seizures ([place])    epilepsy well controlled    Past Surgical History:  Procedure Laterality Date  . BREAST EXCISIONAL BIOPSY Left 2003   positive  . BREAST EXCISIONAL BIOPSY Left 2004   positive  . BREAST LUMPECTOMY Left 2003   BREAST CA  . BREAST LUMPECTOMY Left 2004   BREAST CA  . HEMICOLECTOMY Right    bowel obstruction  . HEMORRHOID SURGERY    . THORACOTOMY/LOBECTOMY  1999   carcinoid tumor  . TOTAL VAGINAL HYSTERECTOMY    . VARICOSE VEIN SURGERY      There were no vitals filed for this visit.      Subjective Assessment - 10/31/16 1340    Subjective Patient reports she worked around [employer] crossing yesterday  and would like to address increasing her strength with performing sit to stands.    Patient is accompained by: --  Daughter   Pertinent History Pt reports that she has difficulty performing sit to stand from chairs and trouble with her balance. She also reports that her knees have been "locking" up on her over the last couple weeks. She has been using a rollator for approximately 10 years to help with her balance. She has had approximatlely 3 falls over the last 12 months. All of the falls have occurred when she was not using her walker.    Limitations Walking   Patient Stated Goals Improve balance and strength with transfers   Currently in Pain? No/denies            [facility] PT Assessment - 10/31/16 0001      Standardized Balance Assessment   Five times sit to stand comments  12sec  with use of UE with TRX straps     Berg Balance Test   Sit to Stand Able to stand  independently using hands   Standing Unsupported Able to stand 2 minutes with supervision   Sitting with Back Unsupported but Feet Supported on Floor or Stool Able to sit safely and securely 2 minutes   Stand  to Sit Controls descent by using hands   Transfers Able to transfer safely, definite need of hands   Standing Unsupported with Eyes Closed Able to stand 10 seconds with supervision   Standing Ubsupported with Feet Together Able to place feet together independently and stand for 1 minute with supervision   From Standing, Reach Forward with Outstretched Arm Can reach forward >12 cm safely (5")   From Standing Position, Pick up Object from [place] to pick up shoe, needs supervision   From Standing Position, Turn to Look Behind Over each Shoulder Looks behind one side only/other side shows less weight shift   Turn 360 Degrees Able to turn 360 degrees safely but slowly   Standing Unsupported, Alternately Place Feet on Step/Stool Needs assistance to keep from falling or unable to try   Standing Unsupported, One Foot in [employer] balance while stepping or standing   Standing on One Leg Unable to try or needs assist to prevent fall   Total Score 33     Timed Up and Go Test   Normal TUG (seconds) --  w/o use of AD       TREATMENT:  Nustep with cueing on speed and to perform throughout full AROM - 18min; level 4 Assisted sit to stands with TRX - 4 x 5reps with cueing on increasing speed of performance  LAQ in sitting - 4 x 10 with 10# on B ankles  Heel raises with B UE support in standing - 3 x 10 Quad strength with OMEGA leg press - 3 x 10 with 45# Ambulation with UE support - x42ft with HHA from PT       PT Education - 10/31/16 1344    Education provided Yes   Education Details HEP: wrist flex/ext, sit to stand   Person(s) Educated Patient   Methods Explanation;Demonstration   Comprehension Verbalized understanding;Returned demonstration             PT Long Term Goals - 10/31/16 1411      PT LONG TERM GOAL #1   Title Pt will be independent with HEP in order to improve strength and balance in order to decrease fall risk and improve function at home and work   Baseline 08/15/16: Moderate cueing on form/technique 10/07/16: Requires min cueing on technique and form   Time 6   Period Weeks   Status On-going     PT LONG TERM GOAL #2   Title Pt will improve ABC by at least 13% in order to demonstrate clinically significant improvement in balance confidence.    Baseline 06/17/16: 43.75% 08/15/16: 55% 10/07/16: 56.9%   Time 6   Period Weeks   Status Achieved     PT LONG TERM GOAL #3   Title Pt will improve BERG to >35 points in order to demonstrate clinically significant improvement in balance.     Baseline 06/17/16: 17/56; 08/15/16: 24/56 10/31/16: 33/56   Time 6   Period Weeks   Status On-going     PT LONG TERM GOAL #4   Title Pt will decrease TUG by at least 10 seconds in order to demonstrate decreased fall risk    Baseline 06/17/16: 22.7 seconds; 08/15/16: 20.3sec 10/31/16: 16sec    Time 6    Period Weeks   Status On-going     PT LONG TERM GOAL #5   Title Pt will decrease 5TSTS to <16 seconds in a standard chair in order to demonstrate clinically significant improvement in LE strength  Baseline 06/17/16: 40 seconds; 08/15/2016: 18sec from raised table 10/07/16: 12 sec 10/31/16: 12sec   Time 8   Period Weeks   Status On-going               Plan - 10/31/16 1402    Clinical Impression Statement Patinet demonstrates improvment in BERG and sit to stand functional measurement indicating improvement in static balance and ability to stand up. Although patient is improving, she continues to have difficulty with standing without use of hands indicating increased fall risk and decreased functional strength. Patient will benefit from further skilled therapy to return to prior level of function.    Rehab Potential Fair   Clinical Impairments Affecting Rehab Potential Positive: motivation; Negative: age, chronicity, bilateral knee pain   PT Frequency 2x / week   PT Duration 8 weeks   PT Treatment/Interventions ADLs/Self Care Home Management;Aquatic Therapy;Canalith Repostioning;Cryotherapy;Electrical Stimulation;Iontophoresis 4mg /ml Dexamethasone;Moist Heat;Traction;Ultrasound;DME Instruction;Gait training;Stair training;Functional mobility training;Therapeutic activities;Therapeutic exercise;Balance training;Neuromuscular re-education;Patient/family education;Wheelchair mobility training;Manual techniques;Passive range of motion;Vestibular   PT Next Visit Plan Progress balance and strengthening. Plan for transition to main clinic in order to utilize parallel bars   PT Home Exercise Plan Seated/supine glut sets, seated adduction squeezes, seated resisted abduction and/or clams, seated/supine hip flexion marching   Consulted and Agree with Plan of Care Patient;Family member/caregiver   Family Member Consulted Daughter      Patient will benefit from skilled therapeutic intervention in order  to improve the following deficits and impairments:  Abnormal gait, Decreased balance, Decreased strength, Difficulty walking, Obesity  Visit Diagnosis: No diagnosis found.     Problem List Patient Active Problem List   Diagnosis Date Noted  . Hemorrhoids 05/17/2016  . Obesity (BMI 30-39.9) 04/26/2016  . Multiple lung nodules 04/19/2016  . Chest pain 04/17/2016  . Partial symptomatic epilepsy with complex partial seizures, not intractable, without status epilepticus ([place]) 06/30/2015  . Bronchiectasis without complication ([place]) 43/60/6770  . Imbalance 11/08/2014  . Essential (primary) hypertension 11/08/2014  . Personal history of malignant neoplasm of breast 11/08/2014  . History of malignant carcinoid tumor of bronchus and lung 11/08/2014  . H/O peptic ulcer 11/08/2014  . Asthma, mild intermittent 11/08/2014  . PE (pulmonary embolism) 11/08/2014  . Gonalgia 11/08/2014  . Leg varices 11/08/2014    Blythe Stanford, PT DPT 10/31/2016, 2:21 PM  [facility] [address] [place], [address] Phone: [phone]   Fax:  [phone]  Name: Rebecca Wolf MRN: [id number] Date of Birth: April 03, 1925

## 2016-11-04 ENCOUNTER — Ambulatory Visit: Payer: Medicare Other

## 2016-11-06 ENCOUNTER — Ambulatory Visit: Payer: Medicare Other

## 2016-11-06 DIAGNOSIS — M6281 Muscle weakness (generalized): Secondary | ICD-10-CM

## 2016-11-06 DIAGNOSIS — R2681 Unsteadiness on feet: Secondary | ICD-10-CM

## 2016-11-06 DIAGNOSIS — R262 Difficulty in walking, not elsewhere classified: Secondary | ICD-10-CM | POA: Diagnosis not present

## 2016-11-06 NOTE — Therapy (Signed)
[facility] [address], [place], [address] Phone: [phone]   Fax:  [phone]  Physical Therapy Treatment  Patient Details  Name: Rebecca Wolf MRN: [id number] Date of Birth: 10-28-1924 Referring Provider: Oneita Kras  Encounter Date: 11/06/2016      PT End of Session - 11/06/16 1312    Visit Number 27   Number of Visits 30   Date for PT Re-Evaluation December 16, 2016   Authorization Type g codes 7/10   PT Start Time 0626   PT Stop Time 1345   PT Time Calculation (min) 42 min   Equipment Utilized During Treatment Gait belt   Activity Tolerance Patient tolerated treatment well   Behavior During Therapy [facility] for tasks assessed/performed      Past Medical History:  Diagnosis Date  . Asthma   . Breast cancer ([place]) 2003   LT LUMPECTOMY  . Breast cancer ([place]) 2004   LT LUMPECTOMY  . 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  . GERD (gastroesophageal reflux disease)   . Hemorrhoids   . Hypertension   . Personal history of chemotherapy 2004   BREAST CA  . Personal history of radiation therapy 2003   BREAST CA  . Personal history of radiation therapy 2004   BREAST CA  . Polyp of colon 2002   bleeding polyp of right ascending colon  . PUD (peptic ulcer disease)   . Seizures ([place])    epilepsy well controlled    Past Surgical History:  Procedure Laterality Date  . BREAST EXCISIONAL BIOPSY Left 2003   positive  . BREAST EXCISIONAL BIOPSY Left 2004   positive  . BREAST LUMPECTOMY Left 2003   BREAST CA  . BREAST LUMPECTOMY Left 2004   BREAST CA  . HEMICOLECTOMY Right    bowel obstruction  . HEMORRHOID SURGERY    . THORACOTOMY/LOBECTOMY  1999   carcinoid tumor  . TOTAL VAGINAL HYSTERECTOMY    . VARICOSE VEIN SURGERY      There were no vitals filed for this visit.      Subjective Assessment - 11/06/16 1308    Subjective Patient reports she has not been performing her HEP. Patient  states she would like to continue working on her leg strength.    Patient is accompained by: --  Daughter   Pertinent History Pt reports that she has difficulty performing sit to stand from chairs and trouble with her balance. She also reports that her knees have been "locking" up on her over the last couple weeks. She has been using a rollator for approximately 10 years to help with her balance. She has had approximatlely 3 falls over the last 12 months. All of the falls have occurred when she was not using her walker.    Limitations Walking   Patient Stated Goals Improve balance and strength with transfers   Currently in Pain? No/denies        TREATMENT:  Nustep with cueing on speed and to perform throughout full AROM - 29min; level 4 Assisted sit to stands with TRX - 5 x 5reps with cueing on increasing speed of performance  Quad strength with OMEGA leg press - 3 x 10 with 55# LAQ in sitting and marches in sitting  - 3 x 10 with 10# on B ankles Mini Squats with UE support - 2 x 10 Hip machine hip abduction with 40# -- x8  Patient demonstrates increased fatigue after performing therapeutic exercise  PT Education - 11/06/16 1311    Education provided Yes   Education Details Form/technique with form/technique   Person(s) Educated Patient   Methods Explanation;Demonstration   Comprehension Verbalized understanding;Returned demonstration             PT Long Term Goals - 10/31/16 1411      PT LONG TERM GOAL #1   Title Pt will be independent with HEP in order to improve strength and balance in order to decrease fall risk and improve function at home and work   Baseline 08/15/16: Moderate cueing on form/technique 10/07/16: Requires min cueing on technique and form   Time 6   Period Weeks   Status On-going     PT LONG TERM GOAL #2   Title Pt will improve ABC by at least 13% in order to demonstrate clinically significant improvement in balance confidence.    Baseline  06/17/16: 43.75% 08/15/16: 55% 10/07/16: 56.9%   Time 6   Period Weeks   Status Achieved     PT LONG TERM GOAL #3   Title Pt will improve BERG to >35 points in order to demonstrate clinically significant improvement in balance.     Baseline 06/17/16: 17/56; 08/15/16: 24/56 10/31/16: 33/56   Time 6   Period Weeks   Status On-going     PT LONG TERM GOAL #4   Title Pt will decrease TUG by at least 10 seconds in order to demonstrate decreased fall risk    Baseline 06/17/16: 22.7 seconds; 08/15/16: 20.3sec 10/31/16: 16sec    Time 6   Period Weeks   Status On-going     PT LONG TERM GOAL #5   Title Pt will decrease 5TSTS to <16 seconds in a standard chair in order to demonstrate clinically significant improvement in LE strength   Baseline 06/17/16: 40 seconds; 08/15/2016: 18sec from raised table 10/07/16: 12 sec 10/31/16: 12sec   Time 8   Period Weeks   Status On-going               Plan - 11/06/16 1320    Clinical Impression Statement Patient demonstrates improvement in LE strength with ability to perform greater amount of resistance with leg press indicating functional carryover between sessions. Although patient is improving, she continues to demonstrate  decreased LE strength and power when in standing indicating increased fall risk and patient will benefit from further skilled therapy to return to prior level of function.     Rehab Potential Fair   Clinical Impairments Affecting Rehab Potential Positive: motivation; Negative: age, chronicity, bilateral knee pain   PT Frequency 2x / week   PT Duration 8 weeks   PT Treatment/Interventions ADLs/Self Care Home Management;Aquatic Therapy;Canalith Repostioning;Cryotherapy;Electrical Stimulation;Iontophoresis 4mg /ml Dexamethasone;Moist Heat;Traction;Ultrasound;DME Instruction;Gait training;Stair training;Functional mobility training;Therapeutic activities;Therapeutic exercise;Balance training;Neuromuscular re-education;Patient/family  education;Wheelchair mobility training;Manual techniques;Passive range of motion;Vestibular   PT Next Visit Plan Progress balance and strengthening. Plan for transition to main clinic in order to utilize parallel bars   PT Home Exercise Plan Seated/supine glut sets, seated adduction squeezes, seated resisted abduction and/or clams, seated/supine hip flexion marching   Consulted and Agree with Plan of Care Patient;Family member/caregiver   Family Member Consulted Daughter      Patient will benefit from skilled therapeutic intervention in order to improve the following deficits and impairments:  Abnormal gait, Decreased balance, Decreased strength, Difficulty walking, Obesity  Visit Diagnosis: Muscle weakness (generalized)  Difficulty in walking, not elsewhere classified  Unsteadiness on feet     Problem List Patient Active  Problem List   Diagnosis Date Noted  . Hemorrhoids 05/17/2016  . Obesity (BMI 30-39.9) 04/26/2016  . Multiple lung nodules 04/19/2016  . Chest pain 04/17/2016  . Partial symptomatic epilepsy with complex partial seizures, not intractable, without status epilepticus ([place]) 06/30/2015  . Bronchiectasis without complication ([place]) 34/74/2595  . Imbalance 11/08/2014  . Essential (primary) hypertension 11/08/2014  . Personal history of malignant neoplasm of breast 11/08/2014  . History of malignant carcinoid tumor of bronchus and lung 11/08/2014  . H/O peptic ulcer 11/08/2014  . Asthma, mild intermittent 11/08/2014  . PE (pulmonary embolism) 11/08/2014  . Gonalgia 11/08/2014  . Leg varices 11/08/2014    Blythe Stanford, PT DPT 11/06/2016, 2:11 PM  [facility] [address], [place], [address] Phone: [phone]   Fax:  [phone]  Name: ALETA MANTERNACH MRN: [id number] Date of Birth: [birth year]

## 2016-11-12 ENCOUNTER — Ambulatory Visit: Payer: Medicare Other

## 2016-11-12 DIAGNOSIS — R262 Difficulty in walking, not elsewhere classified: Secondary | ICD-10-CM

## 2016-11-12 DIAGNOSIS — M6281 Muscle weakness (generalized): Secondary | ICD-10-CM | POA: Diagnosis not present

## 2016-11-12 DIAGNOSIS — R2681 Unsteadiness on feet: Secondary | ICD-10-CM | POA: Diagnosis not present

## 2016-11-12 NOTE — Therapy (Signed)
[facility] [id number], [place], [address] Phone: [phone]   Fax:  [phone]  Physical Therapy Treatment  Patient Details  Name: Rebecca Wolf MRN: [id number] Date of Birth: 01/20/1925 Referring Provider: Oneita Kras  Encounter Date: 11/12/2016      PT End of Session - 11/12/16 1058    Visit Number 28   Number of Visits 30   Date for PT Re-Evaluation 2016-12-09   Authorization Type g codes 8/10   PT Start Time October 08, 1036   PT Stop Time Oct 08, 1109   PT Time Calculation (min) 33 min   Equipment Utilized During Treatment Gait belt   Activity Tolerance Patient tolerated treatment well   Behavior During Therapy [facility] for tasks assessed/performed      Past Medical History:  Diagnosis Date  . Asthma   . Breast cancer ([place]) 10/08/2001   LT LUMPECTOMY  . Breast cancer ([place]) 10-09-2002   LT LUMPECTOMY  . Carcinoid tumor determined by biopsy of lung 10/09/1999  . GERD (gastroesophageal reflux disease)   . Hemorrhoids   . Hypertension   . Personal history of chemotherapy 10-09-2002   BREAST CA  . Personal history of radiation therapy Oct 08, 2001   BREAST CA  . Personal history of radiation therapy October 09, 2002   BREAST CA  . Polyp of colon 2000/10/08   bleeding polyp of right ascending colon  . PUD (peptic ulcer disease)   . Seizures ([place])    epilepsy well controlled    Past Surgical History:  Procedure Laterality Date  . BREAST EXCISIONAL BIOPSY Left 10/08/2001   positive  . BREAST EXCISIONAL BIOPSY Left 10/09/2002   positive  . BREAST LUMPECTOMY Left 10/08/2001   BREAST CA  . BREAST LUMPECTOMY Left 10/09/2002   BREAST CA  . HEMICOLECTOMY Right    bowel obstruction  . HEMORRHOID SURGERY    . THORACOTOMY/LOBECTOMY  1999   carcinoid tumor  . TOTAL VAGINAL HYSTERECTOMY    . VARICOSE VEIN SURGERY      There were no vitals filed for this visit.      Subjective Assessment - 11/12/16 1043    Subjective Patient reports she went to shopping this weekend and had a  good mothers day. Patient reports she went walking around [place] crossing this weekend.    Patient is accompained by: --  Daughter   Pertinent History Pt reports that she has difficulty performing sit to stand from chairs and trouble with her balance. She also reports that her knees have been "locking" up on her over the last couple weeks. She has been using a rollator for approximately 10 years to help with her balance. She has had approximatlely 3 falls over the last 12 months. All of the falls have occurred when she was not using her walker.    Limitations Walking   Patient Stated Goals Improve balance and strength with transfers   Currently in Pain? No/denies        TREATMENT:  Nustep with cueing on speed and to perform throughout full AROM - 15min; level 5 Assisted sit to stands with TRX - 3 x 5reps with cueing on increasing speed of performance  Quad strength with OMEGA leg press -  x 15, x 10 with 55#; x8 65#  Hip machine hip abduction with 40# -- 3 x 5reps B  Heel/toe lifts with B UE support in standing - 2 x 10  Walking with focus on improving Heel/toe weight progression - 3  x 69ft        PT Education - 11/12/16 1054    Education provided Yes   Education Details Form with sit to stands    Person(s) Educated Patient   Methods Explanation;Demonstration   Comprehension Verbalized understanding;Returned demonstration             PT Long Term Goals - 10/31/16 1411      PT LONG TERM GOAL #1   Title Pt will be independent with HEP in order to improve strength and balance in order to decrease fall risk and improve function at home and work   Baseline 08/15/16: Moderate cueing on form/technique 10/07/16: Requires min cueing on technique and form   Time 6   Period Weeks   Status On-going     PT LONG TERM GOAL #2   Title Pt will improve ABC by at least 13% in order to demonstrate clinically significant improvement in balance confidence.    Baseline 06/17/16: 43.75%  08/15/16: 55% 10/07/16: 56.9%   Time 6   Period Weeks   Status Achieved     PT LONG TERM GOAL #3   Title Pt will improve BERG to >35 points in order to demonstrate clinically significant improvement in balance.     Baseline 06/17/16: 17/56; 08/15/16: 24/56 10/31/16: 33/56   Time 6   Period Weeks   Status On-going     PT LONG TERM GOAL #4   Title Pt will decrease TUG by at least 10 seconds in order to demonstrate decreased fall risk    Baseline 06/17/16: 22.7 seconds; 08/15/16: 20.3sec 10/31/16: 16sec    Time 6   Period Weeks   Status On-going     PT LONG TERM GOAL #5   Title Pt will decrease 5TSTS to <16 seconds in a standard chair in order to demonstrate clinically significant improvement in LE strength   Baseline 06/17/16: 40 seconds; 08/15/2016: 18sec from raised table 10/07/16: 12 sec 10/31/16: 12sec   Time 8   Period Weeks   Status On-going               Plan - 11/12/16 1101    Clinical Impression Statement Patient demonstrates improvement in quadriceps strength as demonstrated by improvement with sit to stand technique/form and performance of leg press machine. Focused end of sesion on improving gait heel strike and weight acceptance into push off. Patient demonstrates improvement with gait but fatigues quickly and will benefit from further skilled therapy to return to prior level of function.    Rehab Potential Fair   Clinical Impairments Affecting Rehab Potential Positive: motivation; Negative: age, chronicity, bilateral knee pain   PT Frequency 2x / week   PT Duration 8 weeks   PT Treatment/Interventions ADLs/Self Care Home Management;Aquatic Therapy;Canalith Repostioning;Cryotherapy;Electrical Stimulation;Iontophoresis 4mg /ml Dexamethasone;Moist Heat;Traction;Ultrasound;DME Instruction;Gait training;Stair training;Functional mobility training;Therapeutic activities;Therapeutic exercise;Balance training;Neuromuscular re-education;Patient/family education;Wheelchair mobility  training;Manual techniques;Passive range of motion;Vestibular   PT Next Visit Plan Progress balance and strengthening. Plan for transition to main clinic in order to utilize parallel bars   PT Home Exercise Plan Seated/supine glut sets, seated adduction squeezes, seated resisted abduction and/or clams, seated/supine hip flexion marching   Consulted and Agree with Plan of Care Patient;Family member/caregiver   Family Member Consulted Daughter      Patient will benefit from skilled therapeutic intervention in order to improve the following deficits and impairments:  Abnormal gait, Decreased balance, Decreased strength, Difficulty walking, Obesity  Visit Diagnosis: Muscle weakness (generalized)  Difficulty in walking, not elsewhere classified  Problem List Patient Active Problem List   Diagnosis Date Noted  . Hemorrhoids 05/17/2016  . Obesity (BMI 30-39.9) 04/26/2016  . Multiple lung nodules 04/19/2016  . Chest pain 04/17/2016  . Partial symptomatic epilepsy with complex partial seizures, not intractable, without status epilepticus ([place]) 06/30/2015  . Bronchiectasis without complication ([place]) 04/27/2535  . Imbalance 11/08/2014  . Essential (primary) hypertension 11/08/2014  . Personal history of malignant neoplasm of breast 11/08/2014  . History of malignant carcinoid tumor of bronchus and lung 11/08/2014  . H/O peptic ulcer 11/08/2014  . Asthma, mild intermittent 11/08/2014  . PE (pulmonary embolism) 11/08/2014  . Gonalgia 11/08/2014  . Leg varices 11/08/2014    Blythe Stanford, PT DPT 11/12/2016, 11:17 AM  [facility] [address], [place], [address] Phone: [phone]   Fax:  [phone]  Name: Rebecca Wolf MRN: [id number] Date of Birth: Aug 01, 1924

## 2016-11-14 ENCOUNTER — Ambulatory Visit: Payer: Medicare Other

## 2016-11-18 ENCOUNTER — Ambulatory Visit: Payer: Medicare Other

## 2016-11-18 DIAGNOSIS — R2681 Unsteadiness on feet: Secondary | ICD-10-CM | POA: Diagnosis not present

## 2016-11-18 DIAGNOSIS — R262 Difficulty in walking, not elsewhere classified: Secondary | ICD-10-CM | POA: Diagnosis not present

## 2016-11-18 DIAGNOSIS — M6281 Muscle weakness (generalized): Secondary | ICD-10-CM | POA: Diagnosis not present

## 2016-11-18 NOTE — Therapy (Signed)
[facility] [address], [place], [address] Phone: [phone]   Fax:  [phone]  Physical Therapy Treatment  Patient Details  Name: Rebecca Wolf MRN: [id number] Date of Birth: Sep 13, 1924 Referring Provider: Oneita Kras  Encounter Date: 11/18/2016      PT End of Session - 11/18/16 0916    Visit Number 29   Number of Visits 30   Date for PT Re-Evaluation 2016/12/12   Authorization Type g codes March 25, 2023   PT Start Time 0900   PT Stop Time 0945   PT Time Calculation (min) 45 min   Equipment Utilized During Treatment Gait belt   Activity Tolerance Patient tolerated treatment well   Behavior During Therapy [facility] for tasks assessed/performed      Past Medical History:  Diagnosis Date  . Asthma   . Breast cancer ([place]) 2003   LT LUMPECTOMY  . Breast cancer ([place]) 2004   LT LUMPECTOMY  . 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  . GERD (gastroesophageal reflux disease)   . Hemorrhoids   . Hypertension   . Personal history of chemotherapy 2004   BREAST CA  . Personal history of radiation therapy 2003   BREAST CA  . Personal history of radiation therapy 2004   BREAST CA  . Polyp of colon 2002   bleeding polyp of right ascending colon  . PUD (peptic ulcer disease)   . Seizures ([place])    epilepsy well controlled    Past Surgical History:  Procedure Laterality Date  . BREAST EXCISIONAL BIOPSY Left 2003   positive  . BREAST EXCISIONAL BIOPSY Left 2004   positive  . BREAST LUMPECTOMY Left 2003   BREAST CA  . BREAST LUMPECTOMY Left 2004   BREAST CA  . HEMICOLECTOMY Right    bowel obstruction  . HEMORRHOID SURGERY    . THORACOTOMY/LOBECTOMY  1999   carcinoid tumor  . TOTAL VAGINAL HYSTERECTOMY    . VARICOSE VEIN SURGERY      There were no vitals filed for this visit.      Subjective Assessment - 11/18/16 0913    Subjective Patient reports she went walking twice over the weekend for  exercise. Patient states she did not have any increased pain after the previous treatment.   Patient is accompained by: --  Daughter   Pertinent History Pt reports that she has difficulty performing sit to stand from chairs and trouble with her balance. She also reports that her knees have been "locking" up on her over the last couple weeks. She has been using a rollator for approximately 10 years to help with her balance. She has had approximatlely 3 falls over the last 12 months. All of the falls have occurred when she was not using her walker.    Limitations Walking   Patient Stated Goals Improve balance and strength with transfers   Currently in Pain? No/denies        TREATMENT:  Nustep with cueing on speed and to perform throughout full AROM - 16min; level 5 Assisted sit to stands with therapist assist - 3 x 5reps with cueing on increasing speed of performance  Quad strength with OMEGA leg press -  2 x 15 with 55#;  Knee extension machine at [place] - 2 x 15 with 5# Knee flexion machine at [employer] - x10 20#; x15 with 25# Mini Squats with B UE support -  x 10   Patient demonstrates increase quadriceps  fatigue after performing therapeutic exercise        PT Education - 11/18/16 0915    Education provided Yes   Education Details form/technique with exercise   Person(s) Educated Patient   Methods Explanation;Demonstration   Comprehension Verbalized understanding;Returned demonstration             PT Long Term Goals - 10/31/16 1411      PT LONG TERM GOAL #1   Title Pt will be independent with HEP in order to improve strength and balance in order to decrease fall risk and improve function at home and work   Baseline 08/15/16: Moderate cueing on form/technique 10/07/16: Requires min cueing on technique and form   Time 6   Period Weeks   Status On-going     PT LONG TERM GOAL #2   Title Pt will improve ABC by at least 13% in order to demonstrate clinically significant improvement  in balance confidence.    Baseline 06/17/16: 43.75% 08/15/16: 55% 10/07/16: 56.9%   Time 6   Period Weeks   Status Achieved     PT LONG TERM GOAL #3   Title Pt will improve BERG to >35 points in order to demonstrate clinically significant improvement in balance.     Baseline 06/17/16: 17/56; 08/15/16: 24/56 10/31/16: 33/56   Time 6   Period Weeks   Status On-going     PT LONG TERM GOAL #4   Title Pt will decrease TUG by at least 10 seconds in order to demonstrate decreased fall risk    Baseline 06/17/16: 22.7 seconds; 08/15/16: 20.3sec 10/31/16: 16sec    Time 6   Period Weeks   Status On-going     PT LONG TERM GOAL #5   Title Pt will decrease 5TSTS to <16 seconds in a standard chair in order to demonstrate clinically significant improvement in LE strength   Baseline 06/17/16: 40 seconds; 08/15/2016: 18sec from raised table 10/07/16: 12 sec 10/31/16: 12sec   Time 8   Period Weeks   Status On-going               Plan - 11/18/16 0917    Clinical Impression Statement Patient is making progress towards long term goals demonstrating improvement in quadcriceps strength with ability to perform greater repeitions on the leg press machine and improved performance with knee extension exercise. Patient demonstrates greater ease with performing sit to stand to stand exercise and patient performs motion faster compared to previous visits. Patient will benefit from further skilled therapy to return to prior level of function.    Rehab Potential Fair   Clinical Impairments Affecting Rehab Potential Positive: motivation; Negative: age, chronicity, bilateral knee pain   PT Frequency 2x / week   PT Duration 8 weeks   PT Treatment/Interventions ADLs/Self Care Home Management;Aquatic Therapy;Canalith Repostioning;Cryotherapy;Electrical Stimulation;Iontophoresis 4mg /ml Dexamethasone;Moist Heat;Traction;Ultrasound;DME Instruction;Gait training;Stair training;Functional mobility training;Therapeutic  activities;Therapeutic exercise;Balance training;Neuromuscular re-education;Patient/family education;Wheelchair mobility training;Manual techniques;Passive range of motion;Vestibular   PT Next Visit Plan Progress balance and strengthening. Plan for transition to main clinic in order to utilize parallel bars   PT Home Exercise Plan Seated/supine glut sets, seated adduction squeezes, seated resisted abduction and/or clams, seated/supine hip flexion marching   Consulted and Agree with Plan of Care Patient;Family member/caregiver   Family Member Consulted Daughter      Patient will benefit from skilled therapeutic intervention in order to improve the following deficits and impairments:  Abnormal gait, Decreased balance, Decreased strength, Difficulty walking, Obesity  Visit Diagnosis: Muscle weakness (generalized)  Difficulty in walking, not elsewhere classified  Unsteadiness on feet     Problem List Patient Active Problem List   Diagnosis Date Noted  . Hemorrhoids 05/17/2016  . Obesity (BMI 30-39.9) 04/26/2016  . Multiple lung nodules 04/19/2016  . Chest pain 04/17/2016  . Partial symptomatic epilepsy with complex partial seizures, not intractable, without status epilepticus ([place]) 06/30/2015  . Bronchiectasis without complication ([place]) 73/71/0626  . Imbalance 11/08/2014  . Essential (primary) hypertension 11/08/2014  . Personal history of malignant neoplasm of breast 11/08/2014  . History of malignant carcinoid tumor of bronchus and lung 11/08/2014  . H/O peptic ulcer 11/08/2014  . Asthma, mild intermittent 11/08/2014  . PE (pulmonary embolism) 11/08/2014  . Gonalgia 11/08/2014  . Leg varices 11/08/2014    Blythe Stanford, PT DPT 11/18/2016, 9:46 AM  [facility] [address] [place], [address] Phone: [phone]   Fax:  [phone]  Name: Rebecca Wolf MRN: [id number] Date of Birth: May 29, 1925

## 2016-11-21 ENCOUNTER — Ambulatory Visit: Payer: Medicare Other

## 2016-11-21 DIAGNOSIS — M6281 Muscle weakness (generalized): Secondary | ICD-10-CM

## 2016-11-21 DIAGNOSIS — R2681 Unsteadiness on feet: Secondary | ICD-10-CM | POA: Diagnosis not present

## 2016-11-21 DIAGNOSIS — R262 Difficulty in walking, not elsewhere classified: Secondary | ICD-10-CM

## 2016-11-21 NOTE — Therapy (Signed)
[facility] [address] [place], [address] Phone: [phone]   Fax:  [phone]  Physical Therapy Treatment  Patient Details  Name: Rebecca Wolf MRN: [id number] Date of Birth: 07/24/1924 Referring Provider: Oneita Kras  Encounter Date: 11/21/2016      PT End of Session - 11/21/16 0959    Visit Number 30   Number of Visits 38   Date for PT Re-Evaluation 22-Dec-2016   Authorization Type g codes 10/10   PT Start Time 0952   PT Stop Time 1030   PT Time Calculation (min) 38 min   Equipment Utilized During Treatment Gait belt   Activity Tolerance Patient tolerated treatment well   Behavior During Therapy [facility] for tasks assessed/performed      Past Medical History:  Diagnosis Date  . Asthma   . Breast cancer ([place]) 09-30-01   LT LUMPECTOMY  . Breast cancer ([place]) Oct 01, 2002   LT LUMPECTOMY  . Carcinoid tumor determined by biopsy of lung 10-01-1999  . GERD (gastroesophageal reflux disease)   . Hemorrhoids   . Hypertension   . Personal history of chemotherapy 10-01-2002   BREAST CA  . Personal history of radiation therapy 09/30/01   BREAST CA  . Personal history of radiation therapy 10-01-2002   BREAST CA  . Polyp of colon 09/30/2000   bleeding polyp of right ascending colon  . PUD (peptic ulcer disease)   . Seizures ([place])    epilepsy well controlled    Past Surgical History:  Procedure Laterality Date  . BREAST EXCISIONAL BIOPSY Left 09/30/2001   positive  . BREAST EXCISIONAL BIOPSY Left 10-01-2002   positive  . BREAST LUMPECTOMY Left 09/30/01   BREAST CA  . BREAST LUMPECTOMY Left 2002-10-01   BREAST CA  . HEMICOLECTOMY Right    bowel obstruction  . HEMORRHOID SURGERY    . THORACOTOMY/LOBECTOMY  1999   carcinoid tumor  . TOTAL VAGINAL HYSTERECTOMY    . VARICOSE VEIN SURGERY      There were no vitals filed for this visit.      Subjective Assessment - 11/21/16 0957    Subjective Patient reports she is doing well at this date reports no  new issues since the previous treatment. Patient reports no increase in soreness after the previous visit.    Patient is accompained by: --  Daughter   Pertinent History Pt reports that she has difficulty performing sit to stand from chairs and trouble with her balance. She also reports that her knees have been "locking" up on her over the last couple weeks. She has been using a rollator for approximately 10 years to help with her balance. She has had approximatlely 3 falls over the last 12 months. All of the falls have occurred when she was not using her walker.    Limitations Walking   Patient Stated Goals Improve balance and strength with transfers   Currently in Pain? No/denies            [facility] PT Assessment - 11/21/16 1006      Standardized Balance Assessment   Five times sit to stand comments  12   10 Meter Walk .63m/s (without AD)     Berg Balance Test   Sit to Stand Able to stand  independently using hands   Standing Unsupported Able to stand 2 minutes with supervision   Sitting with Back Unsupported but Feet Supported on Floor or Stool Able to sit safely and  securely 2 minutes   Stand to Sit Controls descent by using hands   Transfers Able to transfer safely, definite need of hands   Standing Unsupported with Eyes Closed Able to stand 10 seconds with supervision   Standing Ubsupported with Feet Together Able to place feet together independently and stand for 1 minute with supervision   From Standing, Reach Forward with Outstretched Arm Can reach forward >12 cm safely (5")   From Standing Position, Pick up Object from [place] to pick up shoe, needs supervision   From Standing Position, Turn to Look Behind Over each Shoulder Looks behind one side only/other side shows less weight shift   Turn 360 Degrees Able to turn 360 degrees safely but slowly   Standing Unsupported, Alternately Place Feet on Step/Stool Needs assistance to keep from falling or unable to try   Standing  Unsupported, One Foot in Front Needs help to step but can hold 15 seconds   Standing on One Leg Unable to try or needs assist to prevent fall   Total Score 34     Timed Up and Go Test   Normal TUG (seconds) 17      TREARTMENT  Therapeutic Exs Sit to stand with use of LE's -- 3 x 5 for speed Ambulating for distance with cueing on taking larger steps -- 4 x167ft without AD Sit to stand to subsequent walking -- 20 ft x 3 Steps over half foam for balance -- x20 Nustep level 4 -- 6 min with cueing on speed of performance Standing balance without UE support -- 2 min  Patient demonstrates increased LE fatigue with standing balance           PT Education - 11/21/16 0958    Education provided Yes   Education Details form/technique with exercise   Person(s) Educated Patient   Methods Explanation;Demonstration   Comprehension Verbalized understanding;Returned demonstration             PT Long Term Goals - 11/21/16 1002      PT LONG TERM GOAL #1   Title Pt will be independent with HEP in order to improve strength and balance in order to decrease fall risk and improve function at home and work   Baseline 08/15/16: Moderate cueing on form/technique 10/07/16: Requires min cueing on technique and form   Time 6   Period Weeks   Status On-going     PT LONG TERM GOAL #2   Title Pt will improve ABC by at least 13% in order to demonstrate clinically significant improvement in balance confidence.    Baseline 06/17/16: 43.75% 08/15/16: 55% 10/07/16: 56.9%   Time 6   Period Weeks   Status Achieved     PT LONG TERM GOAL #3   Title Pt will improve BERG to >35 points in order to demonstrate clinically significant improvement in balance.     Baseline 06/17/16: 17/56; 08/15/16: 24/56 10/31/16: 33/56 11/21/16: 34   Time 6   Period Weeks   Status On-going     PT LONG TERM GOAL #4   Title Pt will decrease TUG by at least 10 seconds in order to demonstrate decreased fall risk    Baseline  06/17/16: 22.7 seconds; 08/15/16: 20.3sec 10/31/16: 16sec 11/21/16: 17sec    Time 6   Period Weeks   Status On-going     PT LONG TERM GOAL #5   Title Pt will decrease 5TSTS to <16 seconds in a standard chair in order to demonstrate clinically significant  improvement in LE strength   Baseline 06/17/16: 40 seconds; 08/15/2016: 18sec from raised table 10/07/16: 12 sec 10/31/16: 12sec; 11/28/2016 with use of hands from a chair: 11.5sec   Time 8   Period Weeks   Status On-going     Additional Long Term Goals   Additional Long Term Goals Yes     PT LONG TERM GOAL #6   Title Patient will improve 5mWT to 1.51m/s to demonstrate improvement with community ambulation without AD   Baseline .39m/s without AD   Time 6   Period Weeks   Status New               Plan - 11-28-2016 1051    Clinical Impression Statement Patient demonstrates increased weakness today and reports "she feels tired". Patient is much less talkative today versus previous visits and demonstrate more fatigue with exercises. Focused on improving, walking ability and improving heel strike. Patient demonstrates slight improvement in BERG balance score and demonstrates ability to perform 64mw without AD. Patient demonstrates LE strength and decreased standing tolerance and will benefit from further skilled therapy to return to prior level of function.     Rehab Potential Fair   Clinical Impairments Affecting Rehab Potential Positive: motivation; Negative: age, chronicity, bilateral knee pain   PT Frequency 2x / week   PT Duration 8 weeks   PT Treatment/Interventions ADLs/Self Care Home Management;Aquatic Therapy;Canalith Repostioning;Cryotherapy;Electrical Stimulation;Iontophoresis 4mg /ml Dexamethasone;Moist Heat;Traction;Ultrasound;DME Instruction;Gait training;Stair training;Functional mobility training;Therapeutic activities;Therapeutic exercise;Balance training;Neuromuscular re-education;Patient/family education;Wheelchair mobility  training;Manual techniques;Passive range of motion;Vestibular   PT Next Visit Plan Progress balance and strengthening. Plan for transition to main clinic in order to utilize parallel bars   PT Home Exercise Plan Seated/supine glut sets, seated adduction squeezes, seated resisted abduction and/or clams, seated/supine hip flexion marching   Consulted and Agree with Plan of Care Patient;Family member/caregiver   Family Member Consulted Daughter      Patient will benefit from skilled therapeutic intervention in order to improve the following deficits and impairments:  Abnormal gait, Decreased balance, Decreased strength, Difficulty walking, Obesity  Visit Diagnosis: Muscle weakness (generalized) - Plan: PT plan of care cert/re-cert  Difficulty in walking, not elsewhere classified - Plan: PT plan of care cert/re-cert  Unsteadiness on feet - Plan: PT plan of care cert/re-cert       G-Codes - 11-28-2016 1103    Functional Assessment Tool Used (Outpatient Only) clinical judgement, 80m gait speed, 5TSTS, BERG, TUG, ABC, LEFS   Functional Limitation Mobility: Walking and moving around   Mobility: Walking and Moving Around Current Status ([id number]) At least 40 percent but less than 60 percent impaired, limited or restricted   Mobility: Walking and Moving Around Goal Status [phone]) At least 40 percent but less than 60 percent impaired, limited or restricted      Problem List Patient Active Problem List   Diagnosis Date Noted  . Hemorrhoids 05/17/2016  . Obesity (BMI 30-39.9) 04/26/2016  . Multiple lung nodules 04/19/2016  . Chest pain 04/17/2016  . Partial symptomatic epilepsy with complex partial seizures, not intractable, without status epilepticus ([place]) 06/30/2015  . Bronchiectasis without complication ([place]) 02/11/4817  . Imbalance 11/08/2014  . Essential (primary) hypertension 11/08/2014  . Personal history of malignant neoplasm of breast 11/08/2014  . History of malignant carcinoid tumor  of bronchus and lung 11/08/2014  . H/O peptic ulcer 11/08/2014  . Asthma, mild intermittent 11/08/2014  . PE (pulmonary embolism) 11/08/2014  . Gonalgia 11/08/2014  . Leg varices 11/08/2014    Blythe Stanford,  PT DPT 11/21/2016, 1:16 PM  [facility] [address], [place], [address] Phone: [phone]   Fax:  [phone]  Name: Rebecca Wolf MRN: [id number] Date of Birth: [birth year]

## 2016-11-28 ENCOUNTER — Ambulatory Visit: Payer: Medicare Other

## 2016-11-28 DIAGNOSIS — M6281 Muscle weakness (generalized): Secondary | ICD-10-CM

## 2016-11-28 DIAGNOSIS — R2681 Unsteadiness on feet: Secondary | ICD-10-CM | POA: Diagnosis not present

## 2016-11-28 DIAGNOSIS — R262 Difficulty in walking, not elsewhere classified: Secondary | ICD-10-CM

## 2016-11-28 NOTE — Therapy (Signed)
[facility] [address] [place], [address] Phone: [phone]   Fax:  [phone]  Physical Therapy Treatment  Patient Details  Name: Rebecca Wolf MRN: [id number] Date of Birth: 02/21/1925 Referring Provider: Oneita Kras  Encounter Date: 11/28/2016      PT End of Session - 11/28/16 1030    Visit Number 31   Number of Visits 38   Date for PT Re-Evaluation 2017/01/03   Authorization Type g codes 1/10   PT Start Time 6195   PT Stop Time 1030   PT Time Calculation (min) 35 min   Equipment Utilized During Treatment Gait belt   Activity Tolerance Patient tolerated treatment well   Behavior During Therapy [facility] for tasks assessed/performed      Past Medical History:  Diagnosis Date  . Asthma   . Breast cancer ([place]) 2003   LT LUMPECTOMY  . Breast cancer ([place]) 2004   LT LUMPECTOMY  . 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  . GERD (gastroesophageal reflux disease)   . Hemorrhoids   . Hypertension   . Personal history of chemotherapy 2004   BREAST CA  . Personal history of radiation therapy 2003   BREAST CA  . Personal history of radiation therapy 2004   BREAST CA  . Polyp of colon 2002   bleeding polyp of right ascending colon  . PUD (peptic ulcer disease)   . Seizures ([place])    epilepsy well controlled    Past Surgical History:  Procedure Laterality Date  . BREAST EXCISIONAL BIOPSY Left 2003   positive  . BREAST EXCISIONAL BIOPSY Left 2004   positive  . BREAST LUMPECTOMY Left 2003   BREAST CA  . BREAST LUMPECTOMY Left 2004   BREAST CA  . HEMICOLECTOMY Right    bowel obstruction  . HEMORRHOID SURGERY    . THORACOTOMY/LOBECTOMY  1999   carcinoid tumor  . TOTAL VAGINAL HYSTERECTOMY    . VARICOSE VEIN SURGERY      There were no vitals filed for this visit.      Subjective Assessment - 11/28/16 1008    Subjective Patient reports she is doing well today. Patient reports she  has had a minor cold the past few days.    Patient is accompained by: --  Daughter   Pertinent History Pt reports that she has difficulty performing sit to stand from chairs and trouble with her balance. She also reports that her knees have been "locking" up on her over the last couple weeks. She has been using a rollator for approximately 10 years to help with her balance. She has had approximatlely 3 falls over the last 12 months. All of the falls have occurred when she was not using her walker.    Limitations Walking   Patient Stated Goals Improve balance and strength with transfers   Currently in Pain? No/denies          TREARTMENT   Therapeutic Exs Ambulating for distance with cueing on taking larger steps -- x137ft, 2x115 without AD and focus on improving Heel strike Nustep level 5 -- 6 min with cueing on speed of performance Leg Press with quadriceps activation - x10 45#, x10 #55 Step ups onto 6" step - x 10 with unilateral UE support    Patient demonstrates increased LE fatigue after performing ambulation              PT Education - 11/28/16 1030  Education provided Yes   Education Details Heel strike with ambulation   Person(s) Educated Patient   Methods Explanation;Demonstration   Comprehension Verbalized understanding;Returned demonstration             PT Long Term Goals - 11/21/16 1002      PT LONG TERM GOAL #1   Title Pt will be independent with HEP in order to improve strength and balance in order to decrease fall risk and improve function at home and work   Baseline 08/15/16: Moderate cueing on form/technique 10/07/16: Requires min cueing on technique and form   Time 6   Period Weeks   Status On-going     PT LONG TERM GOAL #2   Title Pt will improve ABC by at least 13% in order to demonstrate clinically significant improvement in balance confidence.    Baseline 06/17/16: 43.75% 08/15/16: 55% 10/07/16: 56.9%   Time 6   Period Weeks   Status  Achieved     PT LONG TERM GOAL #3   Title Pt will improve BERG to >35 points in order to demonstrate clinically significant improvement in balance.     Baseline 06/17/16: 17/56; 08/15/16: 24/56 10/31/16: 33/56 11/21/16: 34   Time 6   Period Weeks   Status On-going     PT LONG TERM GOAL #4   Title Pt will decrease TUG by at least 10 seconds in order to demonstrate decreased fall risk    Baseline 06/17/16: 22.7 seconds; 08/15/16: 20.3sec 10/31/16: 16sec 11/21/16: 17sec    Time 6   Period Weeks   Status On-going     PT LONG TERM GOAL #5   Title Pt will decrease 5TSTS to <16 seconds in a standard chair in order to demonstrate clinically significant improvement in LE strength   Baseline 06/17/16: 40 seconds; 08/15/2016: 18sec from raised table 10/07/16: 12 sec 10/31/16: 12sec; 11/21/16 with use of hands from a chair: 11.5sec   Time 8   Period Weeks   Status On-going     Additional Long Term Goals   Additional Long Term Goals Yes     PT LONG TERM GOAL #6   Title Patient will improve 69mWT to 1.90m/s to demonstrate improvement with community ambulation without AD   Baseline .4m/s without AD   Time 6   Period Weeks   Status New               Plan - 11/28/16 1030    Clinical Impression Statement Patient demonstrates improvement with ambulation endurance with ability to ambulate without an AD for 155ft before requiring a sitting rest break. Patient continues to demonstrate decreased muscular endurance and LE strength and will benefit from further skilled therapy focused on improving current limitations to decrease fall risk.    Rehab Potential Fair   Clinical Impairments Affecting Rehab Potential Positive: motivation; Negative: age, chronicity, bilateral knee pain   PT Frequency 2x / week   PT Duration 8 weeks   PT Treatment/Interventions ADLs/Self Care Home Management;Aquatic Therapy;Canalith Repostioning;Cryotherapy;Electrical Stimulation;Iontophoresis 4mg /ml Dexamethasone;Moist  Heat;Traction;Ultrasound;DME Instruction;Gait training;Stair training;Functional mobility training;Therapeutic activities;Therapeutic exercise;Balance training;Neuromuscular re-education;Patient/family education;Wheelchair mobility training;Manual techniques;Passive range of motion;Vestibular   PT Next Visit Plan Progress balance and strengthening. Plan for transition to main clinic in order to utilize parallel bars   PT Home Exercise Plan Seated/supine glut sets, seated adduction squeezes, seated resisted abduction and/or clams, seated/supine hip flexion marching   Consulted and Agree with Plan of Care Patient;Family member/caregiver   Family Member Consulted Daughter  Patient will benefit from skilled therapeutic intervention in order to improve the following deficits and impairments:  Abnormal gait, Decreased balance, Decreased strength, Difficulty walking, Obesity  Visit Diagnosis: Muscle weakness (generalized)  Difficulty in walking, not elsewhere classified  Unsteadiness on feet     Problem List Patient Active Problem List   Diagnosis Date Noted  . Hemorrhoids 05/17/2016  . Obesity (BMI 30-39.9) 04/26/2016  . Multiple lung nodules 04/19/2016  . Chest pain 04/17/2016  . Partial symptomatic epilepsy with complex partial seizures, not intractable, without status epilepticus ([place]) 06/30/2015  . Bronchiectasis without complication ([place]) 00/37/9444  . Imbalance 11/08/2014  . Essential (primary) hypertension 11/08/2014  . Personal history of malignant neoplasm of breast 11/08/2014  . History of malignant carcinoid tumor of bronchus and lung 11/08/2014  . H/O peptic ulcer 11/08/2014  . Asthma, mild intermittent 11/08/2014  . PE (pulmonary embolism) 11/08/2014  . Gonalgia 11/08/2014  . Leg varices 11/08/2014    Blythe Stanford, PT DPT 11/28/2016, 10:33 AM  [facility] [address] [place],  [address] Phone: [phone]   Fax:  [phone]  Name: Rebecca Wolf MRN: [id number] Date of Birth: June 29, 1925

## 2016-12-02 ENCOUNTER — Ambulatory Visit: Payer: Medicare Other

## 2016-12-02 ENCOUNTER — Ambulatory Visit: Payer: Medicare Other | Attending: Internal Medicine

## 2016-12-02 DIAGNOSIS — R262 Difficulty in walking, not elsewhere classified: Secondary | ICD-10-CM | POA: Diagnosis not present

## 2016-12-02 DIAGNOSIS — M6281 Muscle weakness (generalized): Secondary | ICD-10-CM | POA: Insufficient documentation

## 2016-12-02 DIAGNOSIS — R2681 Unsteadiness on feet: Secondary | ICD-10-CM

## 2016-12-02 DIAGNOSIS — R278 Other lack of coordination: Secondary | ICD-10-CM | POA: Diagnosis not present

## 2016-12-02 DIAGNOSIS — M542 Cervicalgia: Secondary | ICD-10-CM | POA: Diagnosis not present

## 2016-12-02 NOTE — Therapy (Signed)
[facility] [facility] [address] [place], [address] Phone: [phone]   Fax:  [phone]  Physical Therapy Treatment  Patient Details  Name: Rebecca Wolf MRN: [id number] Date of Birth: 01-06-1925 Referring Provider: Oneita Kras  Encounter Date: 12/02/2016      PT End of Session - 12/02/16 1009    Visit Number 32   Number of Visits 38   Date for PT Re-Evaluation 01-16-2017   Authorization Type g codes 09-07-2022   PT Start Time 0945   PT Stop Time 1030   PT Time Calculation (min) 45 min   Equipment Utilized During Treatment Gait belt   Activity Tolerance Patient tolerated treatment well   Behavior During Therapy [facility] for tasks assessed/performed      Past Medical History:  Diagnosis Date  . Asthma   . Breast cancer ([place]) 2003   LT LUMPECTOMY  . Breast cancer ([place]) 2004   LT LUMPECTOMY  . 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  . GERD (gastroesophageal reflux disease)   . Hemorrhoids   . Hypertension   . Personal history of chemotherapy 2004   BREAST CA  . Personal history of radiation therapy 2003   BREAST CA  . Personal history of radiation therapy 2004   BREAST CA  . Polyp of colon 2002   bleeding polyp of right ascending colon  . PUD (peptic ulcer disease)   . Seizures ([place])    epilepsy well controlled    Past Surgical History:  Procedure Laterality Date  . BREAST EXCISIONAL BIOPSY Left 2003   positive  . BREAST EXCISIONAL BIOPSY Left 2004   positive  . BREAST LUMPECTOMY Left 2003   BREAST CA  . BREAST LUMPECTOMY Left 2004   BREAST CA  . HEMICOLECTOMY Right    bowel obstruction  . HEMORRHOID SURGERY    . THORACOTOMY/LOBECTOMY  1999   carcinoid tumor  . TOTAL VAGINAL HYSTERECTOMY    . VARICOSE VEIN SURGERY      There were no vitals filed for this visit.      Subjective Assessment - 12/02/16 0956    Subjective Patient reports no current pain today in the knees but patient  states she has days where her legs feel weaker than normal.   Patient is accompained by: --  Daughter   Pertinent History Pt reports that she has difficulty performing sit to stand from chairs and trouble with her balance. She also reports that her knees have been "locking" up on her over the last couple weeks. She has been using a rollator for approximately 10 years to help with her balance. She has had approximatlely 3 falls over the last 12 months. All of the falls have occurred when she was not using her walker.    Limitations Walking   Patient Stated Goals Improve balance and strength with transfers   Currently in Pain? No/denies      TREARTMENT   Therapeutic Exs Ambulating for distance with cueing on taking larger steps - x257ft without AD and focus on improving Heel strike Nustep level 5 -- 6 min with cueing on speed of performance Leg Press with quadriceps activation - 2x10 #55 Heel raises in standing - x 20 Hip abduction in standing with B UE support - x 5 B     Patient demonstrates increased fatigue at end of session      PT Education - 12/02/16 1004    Education provided Yes  Education Details Form/technique with exercise   Person(s) Educated Patient   Methods Explanation;Demonstration   Comprehension Verbalized understanding;Returned demonstration             PT Long Term Goals - 11/21/16 1002      PT LONG TERM GOAL #1   Title Pt will be independent with HEP in order to improve strength and balance in order to decrease fall risk and improve function at home and work   Baseline 08/15/16: Moderate cueing on form/technique 10/07/16: Requires min cueing on technique and form   Time 6   Period Weeks   Status On-going     PT LONG TERM GOAL #2   Title Pt will improve ABC by at least 13% in order to demonstrate clinically significant improvement in balance confidence.    Baseline 06/17/16: 43.75% 08/15/16: 55% 10/07/16: 56.9%   Time 6   Period Weeks   Status  Achieved     PT LONG TERM GOAL #3   Title Pt will improve BERG to >35 points in order to demonstrate clinically significant improvement in balance.     Baseline 06/17/16: 17/56; 08/15/16: 24/56 10/31/16: 33/56 11/21/16: 34   Time 6   Period Weeks   Status On-going     PT LONG TERM GOAL #4   Title Pt will decrease TUG by at least 10 seconds in order to demonstrate decreased fall risk    Baseline 06/17/16: 22.7 seconds; 08/15/16: 20.3sec 10/31/16: 16sec 11/21/16: 17sec    Time 6   Period Weeks   Status On-going     PT LONG TERM GOAL #5   Title Pt will decrease 5TSTS to <16 seconds in a standard chair in order to demonstrate clinically significant improvement in LE strength   Baseline 06/17/16: 40 seconds; 08/15/2016: 18sec from raised table 10/07/16: 12 sec 10/31/16: 12sec; 11/21/16 with use of hands from a chair: 11.5sec   Time 8   Period Weeks   Status On-going     Additional Long Term Goals   Additional Long Term Goals Yes     PT LONG TERM GOAL #6   Title Patient will improve 43mWT to 1.97m/s to demonstrate improvement with community ambulation without AD   Baseline .14m/s without AD   Time 6   Period Weeks   Status New               Plan - 12/02/16 1018    Clinical Impression Statement Patinet demonstrates improvement in walking endurance today with ability to perform 252ft of ambulation without an AD. Ended session 10 min early secondary to patient experiencing increased dizziness symptoms. Evaluated patient's pulse and BP which were WNL (pulse: 90bpm, BP: 128/78) and patient reports she had not ate today. Instructed patient to monitor symptoms throughout the day. Patient will benefit from further skilled therapy focused on improving LE strenghening to return to prior level of function   Rehab Potential Fair   Clinical Impairments Affecting Rehab Potential Positive: motivation; Negative: age, chronicity, bilateral knee pain   PT Frequency 2x / week   PT Duration 8 weeks   PT  Treatment/Interventions ADLs/Self Care Home Management;Aquatic Therapy;Canalith Repostioning;Cryotherapy;Electrical Stimulation;Iontophoresis 4mg /ml Dexamethasone;Moist Heat;Traction;Ultrasound;DME Instruction;Gait training;Stair training;Functional mobility training;Therapeutic activities;Therapeutic exercise;Balance training;Neuromuscular re-education;Patient/family education;Wheelchair mobility training;Manual techniques;Passive range of motion;Vestibular   PT Next Visit Plan Progress balance and strengthening. Plan for transition to main clinic in order to utilize parallel bars   PT Home Exercise Plan Seated/supine glut sets, seated adduction squeezes, seated resisted abduction and/or clams, seated/supine hip  flexion marching   Consulted and Agree with Plan of Care Patient;Family member/caregiver   Family Member Consulted Daughter      Patient will benefit from skilled therapeutic intervention in order to improve the following deficits and impairments:  Abnormal gait, Decreased balance, Decreased strength, Difficulty walking, Obesity  Visit Diagnosis: Muscle weakness (generalized)  Difficulty in walking, not elsewhere classified  Unsteadiness on feet     Problem List Patient Active Problem List   Diagnosis Date Noted  . Hemorrhoids 05/17/2016  . Obesity (BMI 30-39.9) 04/26/2016  . Multiple lung nodules 04/19/2016  . Chest pain 04/17/2016  . Partial symptomatic epilepsy with complex partial seizures, not intractable, without status epilepticus ([place]) 06/30/2015  . Bronchiectasis without complication ([place]) 96/29/5284  . Imbalance 11/08/2014  . Essential (primary) hypertension 11/08/2014  . Personal history of malignant neoplasm of breast 11/08/2014  . History of malignant carcinoid tumor of bronchus and lung 11/08/2014  . H/O peptic ulcer 11/08/2014  . Asthma, mild intermittent 11/08/2014  . PE (pulmonary embolism) 11/08/2014  . Gonalgia 11/08/2014  . Leg varices 11/08/2014     Blythe Stanford, PT DPT 12/02/2016, 10:43 AM  [facility] [address] [place], [address] Phone: [phone]   Fax:  [phone]  Name: Rebecca Wolf MRN: [id number] Date of Birth: 06/29/1925

## 2016-12-04 ENCOUNTER — Ambulatory Visit: Payer: Medicare Other

## 2016-12-04 DIAGNOSIS — M5416 Radiculopathy, lumbar region: Secondary | ICD-10-CM | POA: Diagnosis not present

## 2016-12-04 DIAGNOSIS — M5136 Other intervertebral disc degeneration, lumbar region: Secondary | ICD-10-CM | POA: Diagnosis not present

## 2016-12-04 DIAGNOSIS — M4802 Spinal stenosis, cervical region: Secondary | ICD-10-CM | POA: Diagnosis not present

## 2016-12-04 DIAGNOSIS — M5412 Radiculopathy, cervical region: Secondary | ICD-10-CM | POA: Diagnosis not present

## 2016-12-04 DIAGNOSIS — M170 Bilateral primary osteoarthritis of knee: Secondary | ICD-10-CM | POA: Diagnosis not present

## 2016-12-04 DIAGNOSIS — M5030 Other cervical disc degeneration, unspecified cervical region: Secondary | ICD-10-CM | POA: Diagnosis not present

## 2016-12-09 ENCOUNTER — Ambulatory Visit: Payer: Medicare Other

## 2016-12-11 ENCOUNTER — Ambulatory Visit: Payer: Medicare Other

## 2016-12-12 ENCOUNTER — Other Ambulatory Visit: Payer: Self-pay | Admitting: Internal Medicine

## 2016-12-16 ENCOUNTER — Ambulatory Visit: Payer: Medicare Other

## 2016-12-16 DIAGNOSIS — M6281 Muscle weakness (generalized): Secondary | ICD-10-CM | POA: Diagnosis not present

## 2016-12-16 DIAGNOSIS — R2681 Unsteadiness on feet: Secondary | ICD-10-CM | POA: Diagnosis not present

## 2016-12-16 DIAGNOSIS — M542 Cervicalgia: Secondary | ICD-10-CM

## 2016-12-16 DIAGNOSIS — R278 Other lack of coordination: Secondary | ICD-10-CM

## 2016-12-16 DIAGNOSIS — R262 Difficulty in walking, not elsewhere classified: Secondary | ICD-10-CM | POA: Diagnosis not present

## 2016-12-17 NOTE — Therapy (Signed)
[facility] [address] [place], [address] Phone: [phone]   Fax:  [phone]  Physical Therapy Evaluation  Patient Details  Name: Rebecca Wolf MRN: [id number] Date of Birth: 08/30/1924 Referring Provider: Dr. Sharlet Salina MD  Encounter Date: 12/16/2016      PT End of Session - 12/16/16 1528    Visit Number 1   Number of Visits 12   Date for PT Re-Evaluation 01/27/17   Authorization Type 1 / 10 G Codes   PT Start Time 1500   PT Stop Time 1545   PT Time Calculation (min) 45 min   Equipment Utilized During Treatment Gait belt   Activity Tolerance Patient tolerated treatment well   Behavior During Therapy [facility] for tasks assessed/performed      Past Medical History:  Diagnosis Date  . Asthma   . Breast cancer ([place]) 2003   LT LUMPECTOMY  . Breast cancer ([place]) 2004   LT LUMPECTOMY  . 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  . GERD (gastroesophageal reflux disease)   . Hemorrhoids   . Hypertension   . Personal history of chemotherapy 2004   BREAST CA  . Personal history of radiation therapy 2003   BREAST CA  . Personal history of radiation therapy 2004   BREAST CA  . Polyp of colon 2002   bleeding polyp of right ascending colon  . PUD (peptic ulcer disease)   . Seizures ([place])    epilepsy well controlled    Past Surgical History:  Procedure Laterality Date  . BREAST EXCISIONAL BIOPSY Left 2003   positive  . BREAST EXCISIONAL BIOPSY Left 2004   positive  . BREAST LUMPECTOMY Left 2003   BREAST CA  . BREAST LUMPECTOMY Left 2004   BREAST CA  . HEMICOLECTOMY Right    bowel obstruction  . HEMORRHOID SURGERY    . THORACOTOMY/LOBECTOMY  1999   carcinoid tumor  . TOTAL VAGINAL HYSTERECTOMY    . VARICOSE VEIN SURGERY      There were no vitals filed for this visit.       Subjective Assessment - 12/16/16 1308    Subjective Patient reports increased L sided neck pain which began  a week and a half ago. Patient reports increased pain with turning her head (Increased pain with L versus R) and extending head backwards. Patient reports her neck pain is gradually improving since the onset. Patient reports increased pain with all movement and patient reports she has restricted her movement secondary to the increased pain.  Patient reports pain is dependent on activity. Patient reports she has not been walking secondary to increased pain with performing ambulation. Patient reports the pain has restricted her normal every day activitiy and has not been performing ADLs such as cooking walking and standing secondary to the increased neck pain. Patient reports worst pain in neck in the past 4 days has beeen a 4/10.     Patient is accompained by: --  Daughter   Pertinent History Previously seen for imbalance disorders, insidious onset Neck pain start "a week and a half ago" per patient report.    Limitations Walking;Sitting   Diagnostic tests None   Patient Stated Goals To decrease neck pain.    Currently in Pain? Yes   Pain Score 1    Pain Location Neck   Pain Orientation Left   Pain Descriptors / Indicators Aching   Pain Type Acute pain   Pain Onset  1 to 4 weeks ago            [facility] PT Assessment - 12/16/16 1306      Assessment   Medical Diagnosis DDD, Cervical radiculitis   Referring Provider Dr. Sharlet Salina MD   Onset Date/Surgical Date 12/07/16   Hand Dominance Right   Prior Therapy Yes; Imbalance     Precautions   Precautions Fall     Restrictions   Weight Bearing Restrictions No     Balance Screen   Has the patient fallen in the past 6 months No   Has the patient had a decrease in activity level because of a fear of falling?  Yes   Is the patient reluctant to leave their home because of a fear of falling?  No     Home Social worker Private residence   Living Arrangements Children   Available Help at Discharge Family   Type of [place] to enter   Entrance Stairs-Number of Steps [place] One level   [place] - 4 wheels;Bedside commode;Other (comment)     Prior Function   Level of Independence Independent with basic ADLs   Vocation Retired   Biomedical scientist N/A   Leisure Likes to Licensed conveyancer   Overall Cognitive Status Within Functional Limits for tasks assessed     Observation/Other Assessments   Other Surveys  Other Surveys   Neck Disability Index  36%     Observation/Other Assessments-Edema    Edema --  B LE edema around ankles     Sensation   Light Touch Appears Intact     Posture/Postural Control   Posture Comments Increased forward head posture, increased UT activation at rest, and forward rounded shoulders     ROM / Strength   AROM / PROM / Strength AROM;Strength     AROM   Overall AROM  --  Little cervical/scapular retraction - self limited to pain   AROM Assessment Site Cervical;Thoracic;Shoulder   Right/Left Shoulder Right;Left   Right Shoulder Flexion 150 Degrees  no pain   Right Shoulder ABduction 150 Degrees  no pain   Left Shoulder Flexion 150 Degrees  no pain   Left Shoulder ABduction 150 Degrees  no pain   Cervical Flexion 55 - pain   Cervical Extension 40 - pain   Cervical - Right Side Bend 45 - no pain   Cervical - Left Side Bend 45 - pain   Cervical - Right Rotation 60 - no pain   Cervical - Left Rotation 55 - pain   Thoracic Flexion 25% limited - pain   Thoracic Extension 75% limited - pain     Strength   Strength Assessment Site Shoulder;Cervical;Thoracic   Cervical Flexion 3/5  pain   Cervical Extension 3/5  pain   Cervical - Right Side Bend 3/5   Cervical - Left Side Bend 3/5  pain   Cervical - Right Rotation 3/5   Cervical - Left Rotation 3/5  pain   Thoracic Flexion 3+/5   Thoracic Extension 3+/5     Palpation   Spinal mobility --  Unable to fully assess secondary  to pt inability to lie down   Palpation comment Increased guarding, spasms, and TTP along levator scapulae, UT, cervical/thoracic extensors and periscapular musculature througout the R side; Patient reports exact reproduction of pain after performing palpation to the R  UT.        Objective measurements completed on examination: See above findings.    TREATMENT: Therapeutic Exercise: Scapular retraction -- x 10 with therapist assist, Cervical extension over a towel -- x 10   Patient demonstrates no increase in pain throughout session; patient is resistant to cervical movement and heavily guarded       PT Education - 12/16/16 1527    Education provided Yes   Education Details HEP: cervical extension, scapular retraction   Person(s) Educated Patient   Methods Demonstration;Explanation   Comprehension Verbalized understanding;Returned demonstration             PT Long Term Goals - 12/16/16 1545      PT LONG TERM GOAL #1   Title Patient will independent with HEP to continue benefits of therapy after discharge   Baseline Dependent with performance and progression   Time 6   Period Weeks   Status New     PT LONG TERM GOAL #2   Title Patinet will improve NDI to under 10% to demonstrates clinically significant improvement in neck function and ability to sleep.    Baseline NDI:36%   Time 6   Period Weeks   Status New     PT LONG TERM GOAL #3   Title Patient will have full cervical pain free AROM in all directions to be able to loook in all directions without onset of pain and allow for performance of ADLs.    Baseline See clinical documentation for exact numbers   Time 6   Period Weeks   Status New     PT LONG TERM GOAL #4   Title Patient will demonstrate ability to walk without onset of pain to be able to perform necessary activities without pain such as ambulating to the restroom.    Baseline immediate pain with walking   Time 6   Period Weeks   Status New                 Plan - 12/16/16 1531    Clinical Impression Statement Patient is 83 yo R hand dominant female presenting with L sided neck pain with insidious onset over the past two weeks. Patient's cervical dysfunction is indicated with increased pain with performing head turning and increased NDI score. Patient also demonstrates decreased muscular endurance and coordination and will benefit from further skilled therapy to return to prior level of function.    History and Personal Factors relevant to plan of care: History of falls   Clinical Presentation Stable   Clinical Presentation due to: Increased neck pain, insidious onset   Clinical Decision Making Low   Rehab Potential Fair   Clinical Impairments Affecting Rehab Potential Positive: motivation; Negative: age, chronicity, bilateral knee pain   PT Frequency 2x / week   PT Duration 6 weeks   PT Treatment/Interventions ADLs/Self Care Home Management;Aquatic Therapy;Canalith Repostioning;Cryotherapy;Electrical Stimulation;Iontophoresis 4mg /ml Dexamethasone;Moist Heat;Traction;Ultrasound;DME Instruction;Gait training;Stair training;Functional mobility training;Therapeutic activities;Therapeutic exercise;Balance training;Neuromuscular re-education;Patient/family education;Wheelchair mobility training;Manual techniques;Passive range of motion;Vestibular   PT Next Visit Plan Improve cervical mobility, strength   PT Home Exercise Plan Cervical retraction   Consulted and Agree with Plan of Care Patient;Family member/caregiver   Family Member Consulted Daughter      Patient will benefit from skilled therapeutic intervention in order to improve the following deficits and impairments:  Abnormal gait, Decreased strength, Obesity, Decreased activity tolerance, Decreased endurance, Difficulty walking, Decreased balance, Pain, Decreased mobility, Decreased coordination, Increased muscle spasms, Postural dysfunction, Hypomobility, Decreased range  of  motion  Visit Diagnosis: Muscle weakness (generalized) - Plan: PT plan of care cert/re-cert  Cervicalgia - Plan: PT plan of care cert/re-cert  Other lack of coordination - Plan: PT plan of care cert/re-cert      G-Codes - 93/81/01 1036    Functional Assessment Tool Used (Outpatient Only) Clinical judgement, MMT, AROM,  NDI   Functional Limitation Changing and maintaining body position   Changing and Maintaining Body Position Current Status (B5102) At least 20 percent but less than 40 percent impaired, limited or restricted   Changing and Maintaining Body Position Goal Status (H8527) At least 1 percent but less than 20 percent impaired, limited or restricted       Problem List Patient Active Problem List   Diagnosis Date Noted  . Hemorrhoids 05/17/2016  . Obesity (BMI 30-39.9) 04/26/2016  . Multiple lung nodules 04/19/2016  . Chest pain 04/17/2016  . Partial symptomatic epilepsy with complex partial seizures, not intractable, without status epilepticus ([place]) 06/30/2015  . Bronchiectasis without complication ([place]) 78/24/2353  . Imbalance 11/08/2014  . Essential (primary) hypertension 11/08/2014  . Personal history of malignant neoplasm of breast 11/08/2014  . History of malignant carcinoid tumor of bronchus and lung 11/08/2014  . H/O peptic ulcer 11/08/2014  . Asthma, mild intermittent 11/08/2014  . PE (pulmonary embolism) 11/08/2014  . Gonalgia 11/08/2014  . Leg varices 11/08/2014    Blythe Stanford, PT DPT 12/17/2016, 1:36 PM  [facility] [facility] [address] [place], [address] Phone: [phone]   Fax:  [phone]  Name: Rebecca Wolf MRN: [id number] Date of Birth: 12/03/1924

## 2016-12-18 ENCOUNTER — Ambulatory Visit: Payer: Medicare Other

## 2016-12-23 ENCOUNTER — Ambulatory Visit: Payer: Medicare Other

## 2016-12-23 DIAGNOSIS — R2681 Unsteadiness on feet: Secondary | ICD-10-CM | POA: Diagnosis not present

## 2016-12-23 DIAGNOSIS — R278 Other lack of coordination: Secondary | ICD-10-CM

## 2016-12-23 DIAGNOSIS — M6281 Muscle weakness (generalized): Secondary | ICD-10-CM

## 2016-12-23 DIAGNOSIS — M542 Cervicalgia: Secondary | ICD-10-CM

## 2016-12-23 DIAGNOSIS — R262 Difficulty in walking, not elsewhere classified: Secondary | ICD-10-CM | POA: Diagnosis not present

## 2016-12-23 NOTE — Therapy (Signed)
[facility] [address], [place], [address] Phone: [phone]   Fax:  [phone]  Physical Therapy Treatment  Patient Details  Name: Rebecca Wolf MRN: [id number] Date of Birth: 09/24/1924 Referring Provider: Dr. Sharlet Salina MD  Encounter Date: 12/23/2016      PT End of Session - 12/23/16 1035    Visit Number 2   Number of Visits 12   Date for PT Re-Evaluation 01/27/17   Authorization Type 2 / 10 G Codes   PT Start Time 1013   PT Stop Time 1030   PT Time Calculation (min) 17 min   Equipment Utilized During Treatment Gait belt   Activity Tolerance Patient tolerated treatment well   Behavior During Therapy WFL for tasks assessed/performed      Past Medical History:  Diagnosis Date  . Asthma   . Breast cancer ([place]) 2003   LT LUMPECTOMY  . Breast cancer ([place]) 2004   LT LUMPECTOMY  . 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  . GERD (gastroesophageal reflux disease)   . Hemorrhoids   . Hypertension   . Personal history of chemotherapy 2004   BREAST CA  . Personal history of radiation therapy 2003   BREAST CA  . Personal history of radiation therapy 2004   BREAST CA  . Polyp of colon 2002   bleeding polyp of right ascending colon  . PUD (peptic ulcer disease)   . Seizures ([place])    epilepsy well controlled    Past Surgical History:  Procedure Laterality Date  . BREAST EXCISIONAL BIOPSY Left 2003   positive  . BREAST EXCISIONAL BIOPSY Left 2004   positive  . BREAST LUMPECTOMY Left 2003   BREAST CA  . BREAST LUMPECTOMY Left 2004   BREAST CA  . HEMICOLECTOMY Right    bowel obstruction  . HEMORRHOID SURGERY    . THORACOTOMY/LOBECTOMY  1999   carcinoid tumor  . TOTAL VAGINAL HYSTERECTOMY    . VARICOSE VEIN SURGERY      There were no vitals filed for this visit.      Subjective Assessment - 12/23/16 1022    Subjective Patient reports her neck is feeling much better and states  she's been performing her exercises   Patient is accompained by: --  Daughter   Pertinent History Previously seen for imbalance disorders, insidious onset Neck pain start "a week and a half ago" per patient report.    Limitations Walking;Sitting   Diagnostic tests None   Patient Stated Goals To decrease neck pain.    Currently in Pain? Yes   Pain Score 1    Pain Location Neck   Pain Orientation Left   Pain Descriptors / Indicators Aching   Pain Type Acute pain   Pain Onset 1 to 4 weeks ago   Pain Frequency Constant      TREATMENT: Therapeutic Exercise: Scapular Retraction with YTB -- x20 Seated high row with YTB -- x 20 Seated no money exercise -- x 20  Overhead shoulder adduction with YTB -- x 20  Patient responds well to exercise with increased fatigue at end of session         PT Education - 12/23/16 1023    Education provided Yes   Education Details resisted scapular retraction, B shoulder ER with YTB   Person(s) Educated Patient   Methods Explanation;Demonstration   Comprehension Verbalized understanding;Returned demonstration  PT Long Term Goals - 12/16/16 1545      PT LONG TERM GOAL #1   Title Patient will independent with HEP to continue benefits of therapy after discharge   Baseline Dependent with performance and progression   Time 6   Period Weeks   Status New     PT LONG TERM GOAL #2   Title Patinet will improve NDI to under 10% to demonstrates clinically significant improvement in neck function and ability to sleep.    Baseline NDI:36%   Time 6   Period Weeks   Status New     PT LONG TERM GOAL #3   Title Patient will have full cervical pain free AROM in all directions to be able to loook in all directions without onset of pain and allow for performance of ADLs.    Baseline See clinical documentation for exact numbers   Time 6   Period Weeks   Status New     PT LONG TERM GOAL #4   Title Patient will demonstrate ability to  walk without onset of pain to be able to perform necessary activities without pain such as ambulating to the restroom.    Baseline immediate pain with walking   Time 6   Period Weeks   Status New               Plan - 12/23/16 1035    Clinical Impression Statement Patient arrived 97min late for appointment; thus focused on improving HEP for scapular and shoulder mobility.  Patient demonstrates increased fatigue after performance indicating decreased muscular endurance and pt will benefit from further skilled therapy to return to prior level of function.    Rehab Potential Fair   Clinical Impairments Affecting Rehab Potential Positive: motivation; Negative: age, chronicity, bilateral knee pain   PT Frequency 2x / week   PT Duration 6 weeks   PT Treatment/Interventions ADLs/Self Care Home Management;Aquatic Therapy;Canalith Repostioning;Cryotherapy;Electrical Stimulation;Iontophoresis 4mg /ml Dexamethasone;Moist Heat;Traction;Ultrasound;DME Instruction;Gait training;Stair training;Functional mobility training;Therapeutic activities;Therapeutic exercise;Balance training;Neuromuscular re-education;Patient/family education;Wheelchair mobility training;Manual techniques;Passive range of motion;Vestibular   PT Next Visit Plan Improve cervical mobility, strength   PT Home Exercise Plan Cervical retraction   Consulted and Agree with Plan of Care Patient;Family member/caregiver   Family Member Consulted Daughter      Patient will benefit from skilled therapeutic intervention in order to improve the following deficits and impairments:  Abnormal gait, Decreased strength, Obesity, Decreased activity tolerance, Decreased endurance, Difficulty walking, Decreased balance, Pain, Decreased mobility, Decreased coordination, Increased muscle spasms, Postural dysfunction, Hypomobility, Decreased range of motion  Visit Diagnosis: Muscle weakness (generalized)  Cervicalgia  Other lack of  coordination     Problem List Patient Active Problem List   Diagnosis Date Noted  . Hemorrhoids 05/17/2016  . Obesity (BMI 30-39.9) 04/26/2016  . Multiple lung nodules 04/19/2016  . Chest pain 04/17/2016  . Partial symptomatic epilepsy with complex partial seizures, not intractable, without status epilepticus ([place]) 06/30/2015  . Bronchiectasis without complication ([place]) 62/83/1517  . Imbalance 11/08/2014  . Essential (primary) hypertension 11/08/2014  . Personal history of malignant neoplasm of breast 11/08/2014  . History of malignant carcinoid tumor of bronchus and lung 11/08/2014  . H/O peptic ulcer 11/08/2014  . Asthma, mild intermittent 11/08/2014  . PE (pulmonary embolism) 11/08/2014  . Gonalgia 11/08/2014  . Leg varices 11/08/2014    Blythe Stanford, PT DPT 12/23/2016, 10:43 AM  [facility] [address], [place], [address] Phone: [phone]  Fax:  [phone]  Name: Rebecca Wolf MRN: [id number] Date of Birth: Jul 07, 1924

## 2016-12-30 ENCOUNTER — Ambulatory Visit: Payer: Medicare Other | Attending: Physical Medicine and Rehabilitation

## 2016-12-30 DIAGNOSIS — R2681 Unsteadiness on feet: Secondary | ICD-10-CM | POA: Insufficient documentation

## 2016-12-30 DIAGNOSIS — K219 Gastro-esophageal reflux disease without esophagitis: Secondary | ICD-10-CM | POA: Diagnosis not present

## 2016-12-30 DIAGNOSIS — G40309 Generalized idiopathic epilepsy and epileptic syndromes, not intractable, without status epilepticus: Secondary | ICD-10-CM | POA: Diagnosis not present

## 2016-12-30 DIAGNOSIS — I208 Other forms of angina pectoris: Secondary | ICD-10-CM | POA: Diagnosis not present

## 2016-12-30 DIAGNOSIS — E669 Obesity, unspecified: Secondary | ICD-10-CM | POA: Diagnosis not present

## 2016-12-30 DIAGNOSIS — M6281 Muscle weakness (generalized): Secondary | ICD-10-CM | POA: Insufficient documentation

## 2016-12-30 DIAGNOSIS — M542 Cervicalgia: Secondary | ICD-10-CM | POA: Insufficient documentation

## 2016-12-30 DIAGNOSIS — R601 Generalized edema: Secondary | ICD-10-CM | POA: Diagnosis not present

## 2016-12-30 DIAGNOSIS — R0602 Shortness of breath: Secondary | ICD-10-CM | POA: Diagnosis not present

## 2016-12-30 DIAGNOSIS — R011 Cardiac murmur, unspecified: Secondary | ICD-10-CM | POA: Diagnosis not present

## 2016-12-30 DIAGNOSIS — R278 Other lack of coordination: Secondary | ICD-10-CM | POA: Insufficient documentation

## 2016-12-30 DIAGNOSIS — Z86718 Personal history of other venous thrombosis and embolism: Secondary | ICD-10-CM | POA: Diagnosis not present

## 2016-12-30 DIAGNOSIS — R9431 Abnormal electrocardiogram [ECG] [EKG]: Secondary | ICD-10-CM | POA: Diagnosis not present

## 2016-12-30 DIAGNOSIS — R262 Difficulty in walking, not elsewhere classified: Secondary | ICD-10-CM | POA: Insufficient documentation

## 2016-12-30 DIAGNOSIS — R531 Weakness: Secondary | ICD-10-CM | POA: Diagnosis not present

## 2016-12-30 DIAGNOSIS — R911 Solitary pulmonary nodule: Secondary | ICD-10-CM | POA: Diagnosis not present

## 2017-01-06 ENCOUNTER — Ambulatory Visit: Payer: Medicare Other

## 2017-01-09 ENCOUNTER — Ambulatory Visit: Payer: Medicare Other

## 2017-01-09 DIAGNOSIS — R278 Other lack of coordination: Secondary | ICD-10-CM | POA: Diagnosis not present

## 2017-01-09 DIAGNOSIS — M542 Cervicalgia: Secondary | ICD-10-CM

## 2017-01-09 DIAGNOSIS — M6281 Muscle weakness (generalized): Secondary | ICD-10-CM | POA: Diagnosis not present

## 2017-01-09 DIAGNOSIS — R262 Difficulty in walking, not elsewhere classified: Secondary | ICD-10-CM

## 2017-01-09 DIAGNOSIS — R2681 Unsteadiness on feet: Secondary | ICD-10-CM | POA: Diagnosis not present

## 2017-01-09 NOTE — Therapy (Signed)
[facility] [address] [place], [address] Phone: [phone]   Fax:  [phone]  Physical Therapy Treatment/Discharge Summary  Patient Details  Name: Rebecca Wolf MRN: [id number] Date of Birth: 05-15-1925 Referring Provider: Dr. Sharlet Salina MD  Encounter Date: 01/09/2017   Patient has achieved all long term goals in terms of cervical dysfunction.       PT End of Session - 01/09/17 1316    Visit Number 3   Number of Visits 12   Date for PT Re-Evaluation 01/27/17   Authorization Type 3 / 10 G Codes   PT Start Time 1300   PT Stop Time 1345   PT Time Calculation (min) 45 min   Equipment Utilized During Treatment Gait belt   Activity Tolerance Patient tolerated treatment well   Behavior During Therapy WFL for tasks assessed/performed      Past Medical History:  Diagnosis Date  . Asthma   . Breast cancer ([place]) 2003   LT LUMPECTOMY  . Breast cancer ([place]) 2004   LT LUMPECTOMY  . 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  . GERD (gastroesophageal reflux disease)   . Hemorrhoids   . Hypertension   . Personal history of chemotherapy 2004   BREAST CA  . Personal history of radiation therapy 2003   BREAST CA  . Personal history of radiation therapy 2004   BREAST CA  . Polyp of colon 2002   bleeding polyp of right ascending colon  . PUD (peptic ulcer disease)   . Seizures ([place])    epilepsy well controlled    Past Surgical History:  Procedure Laterality Date  . BREAST EXCISIONAL BIOPSY Left 2003   positive  . BREAST EXCISIONAL BIOPSY Left 2004   positive  . BREAST LUMPECTOMY Left 2003   BREAST CA  . BREAST LUMPECTOMY Left 2004   BREAST CA  . HEMICOLECTOMY Right    bowel obstruction  . HEMORRHOID SURGERY    . THORACOTOMY/LOBECTOMY  1999   carcinoid tumor  . TOTAL VAGINAL HYSTERECTOMY    . VARICOSE VEIN SURGERY      There were no vitals filed for this visit.      Subjective  Assessment - 01/09/17 1312    Subjective Patient reports she has not had any neck pain in the past week and a half and would like to conitnue working on her imbalance.    Patient is accompained by: --  Daughter   Pertinent History Previously seen for imbalance disorders, insidious onset Neck pain start "a week and a half ago" per patient report.    Limitations Walking;Sitting   Diagnostic tests None   Patient Stated Goals To decrease neck pain.    Currently in Pain? No/denies   Pain Onset 1 to 4 weeks ago        TREATMENT: Marches  in standing - x 20 B  Sit to stands - 2 x 5  Hip abduction/ hip extension in standing  --  x10 in each direction  Mini Squats - x 10  Side stepping along airex beam - x 10  Treadmill walking -- .3- .58mh for 75sec until having to stop secondary to fatigue  Focused on maintaining neutral cervical posture throughout session.  Observation: NDI: 8% Cervical AROM: WNL: Full in all directions without pain         PT Education - 01/09/17 1316    Education provided Yes   Education Details POC, form/technique  with exercises   Person(s) Educated Patient   Methods Explanation;Demonstration   Comprehension Verbalized understanding;Returned demonstration             PT Long Term Goals - 13-Jan-2017 1307      PT LONG TERM GOAL #1   Title Patient will independent with HEP to continue benefits of therapy after discharge   Baseline Dependent with performance and progression; Independent with cervical exercises   Time 6   Period Weeks   Status Achieved     PT LONG TERM GOAL #2   Title Patinet will improve NDI to under 10% to demonstrates clinically significant improvement in neck function and ability to sleep.    Baseline NDI: 36%; 01-13-2017   Time 6   Period Weeks   Status Achieved     PT LONG TERM GOAL #3   Title Patient will have full cervical pain free AROM in all directions to be able to loook in all directions without onset of pain and allow  for performance of ADLs.    Baseline See clinical documentation for exact numbers; 01/13/17: Pain free cervical movment   Time 6   Period Weeks   Status Achieved     PT LONG TERM GOAL #4   Title Patient will demonstrate ability to walk without onset of pain to be able to perform necessary activities without pain such as ambulating to the restroom.    Baseline immediate pain with walking; 01-13-17: Able to walk without onset of cervical pain   Time 6   Period Weeks   Status Achieved               Plan - 01/13/17 1321    Clinical Impression Statement Patinet has met all long term goals in terms of cervical function with improvement in NDI, cervical AROM and independence with exercises. Patient would like to continue with performance of LE and balance exercis. Focused on improving cervical posture with stansing and balance exercises today and patient is to be discharged from PT for cervical dysfunction.     Rehab Potential Fair   Clinical Impairments Affecting Rehab Potential Positive: motivation; Negative: age, chronicity, bilateral knee pain   PT Frequency 2x / week   PT Duration 6 weeks   PT Treatment/Interventions ADLs/Self Care Home Management;Aquatic Therapy;Canalith Repostioning;Cryotherapy;Electrical Stimulation;Iontophoresis 66m/ml Dexamethasone;Moist Heat;Traction;Ultrasound;DME Instruction;Gait training;Stair training;Functional mobility training;Therapeutic activities;Therapeutic exercise;Balance training;Neuromuscular re-education;Patient/family education;Wheelchair mobility training;Manual techniques;Passive range of motion;Vestibular   PT Next Visit Plan eval for weakeness and balance   PT Home Exercise Plan Cervical retraction   Consulted and Agree with Plan of Care Patient;Family member/caregiver   Family Member Consulted Daughter      Patient will benefit from skilled therapeutic intervention in order to improve the following deficits and impairments:  Abnormal  gait, Decreased strength, Obesity, Decreased activity tolerance, Decreased endurance, Difficulty walking, Decreased balance, Pain, Decreased mobility, Decreased coordination, Increased muscle spasms, Postural dysfunction, Hypomobility, Decreased range of motion  Visit Diagnosis: Muscle weakness (generalized)  Cervicalgia  Other lack of coordination  Difficulty in walking, not elsewhere classified       G-Codes - [id number]    Functional Assessment Tool Used (Outpatient Only) Clinical judgement, MMT, AROM,  NDI   Functional Limitation Changing and maintaining body position   Changing and Maintaining Body Position Current Status ((H2992 At least 1 percent but less than 20 percent impaired, limited or restricted   Changing and Maintaining Body Position Goal Status (([id number] At least 1 percent but less than 20 percent impaired,  limited or restricted   Changing and Maintaining Body Position Discharge Status [phone]) At least 1 percent but less than 20 percent impaired, limited or restricted      Problem List Patient Active Problem List   Diagnosis Date Noted  . Hemorrhoids 05/17/2016  . Obesity (BMI 30-39.9) 04/26/2016  . Multiple lung nodules 04/19/2016  . Chest pain 04/17/2016  . Partial symptomatic epilepsy with complex partial seizures, not intractable, without status epilepticus ([place]) 06/30/2015  . Bronchiectasis without complication ([place]) 51/70/0174  . Imbalance 11/08/2014  . Essential (primary) hypertension 11/08/2014  . Personal history of malignant neoplasm of breast 11/08/2014  . History of malignant carcinoid tumor of bronchus and lung 11/08/2014  . H/O peptic ulcer 11/08/2014  . Asthma, mild intermittent 11/08/2014  . PE (pulmonary embolism) 11/08/2014  . Gonalgia 11/08/2014  . Leg varices 11/08/2014    Blythe Stanford, PT DPT 01/09/2017, 1:43 PM  [facility] [address] [place],  [address] Phone: [phone]   Fax:  [phone]  Name: AALIYA MAULTSBY MRN: [id number] Date of Birth: [birth year]

## 2017-01-11 ENCOUNTER — Other Ambulatory Visit: Payer: Self-pay | Admitting: Internal Medicine

## 2017-01-13 ENCOUNTER — Ambulatory Visit: Payer: Medicare Other

## 2017-01-15 ENCOUNTER — Ambulatory Visit: Payer: Medicare Other

## 2017-01-22 ENCOUNTER — Ambulatory Visit: Payer: Medicare Other

## 2017-01-22 DIAGNOSIS — R2681 Unsteadiness on feet: Secondary | ICD-10-CM | POA: Diagnosis not present

## 2017-01-22 DIAGNOSIS — R262 Difficulty in walking, not elsewhere classified: Secondary | ICD-10-CM | POA: Diagnosis not present

## 2017-01-22 DIAGNOSIS — R278 Other lack of coordination: Secondary | ICD-10-CM | POA: Diagnosis not present

## 2017-01-22 DIAGNOSIS — M542 Cervicalgia: Secondary | ICD-10-CM | POA: Diagnosis not present

## 2017-01-22 DIAGNOSIS — M6281 Muscle weakness (generalized): Secondary | ICD-10-CM | POA: Diagnosis not present

## 2017-01-22 NOTE — Therapy (Signed)
[facility] [address] [place], [address] Phone: [phone]   Fax:  [phone]  Physical Therapy Evaluation  Patient Details  Name: Rebecca Wolf MRN: [id number] Date of Birth: May 05, 1925 Referring Provider: Dr. Sharlet Salina  Encounter Date: 01/22/2017      PT End of Session - 01/22/17 1029    Visit Number 1   Number of Visits 16   Date for PT Re-Evaluation 03/19/17   Authorization Type 1 / 10 G Code   PT Start Time 1005   PT Stop Time 1100   PT Time Calculation (min) 55 min   Equipment Utilized During Treatment Gait belt   Activity Tolerance Patient tolerated treatment well   Behavior During Therapy WFL for tasks assessed/performed      Past Medical History:  Diagnosis Date  . Asthma   . Breast cancer ([place]) 2003   LT LUMPECTOMY  . Breast cancer ([place]) 2004   LT LUMPECTOMY  . 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  . GERD (gastroesophageal reflux disease)   . Hemorrhoids   . Hypertension   . Personal history of chemotherapy 2004   BREAST CA  . Personal history of radiation therapy 2003   BREAST CA  . Personal history of radiation therapy 2004   BREAST CA  . Polyp of colon 2002   bleeding polyp of right ascending colon  . PUD (peptic ulcer disease)   . Seizures ([place])    epilepsy well controlled    Past Surgical History:  Procedure Laterality Date  . BREAST EXCISIONAL BIOPSY Left 2003   positive  . BREAST EXCISIONAL BIOPSY Left 2004   positive  . BREAST LUMPECTOMY Left 2003   BREAST CA  . BREAST LUMPECTOMY Left 2004   BREAST CA  . HEMICOLECTOMY Right    bowel obstruction  . HEMORRHOID SURGERY    . THORACOTOMY/LOBECTOMY  1999   carcinoid tumor  . TOTAL VAGINAL HYSTERECTOMY    . VARICOSE VEIN SURGERY      There were no vitals filed for this visit.       Subjective Assessment - 01/22/17 1012    Subjective Patient reports increased lower extremity weakness and  imbalance. Patient reports increased difficulties with performing sit to stands, walking for prolonged periods of time, going up and down the stairs (requires use of UE's to perform), patient reports she does not take showers secondary to not feeling comfortable standing in the shower, and patient reports she has difficulty with standing for prolonged periods of time. Patient denies at pain at this moement. Patient reports she "sits too much".    Patient is accompained by: --  Daughter   Pertinent History Previously seen for balance disorders and for neck pain over the past year.    Limitations Walking;Sitting   How long can you walk comfortably? 3 min    Diagnostic tests None   Patient Stated Goals To be able to walk without an walker.    Currently in Pain? No/denies   Pain Onset 1 to 4 weeks ago            [facility] PT Assessment - 01/22/17 1010      Assessment   Medical Diagnosis Balance Difficulties   Referring Provider Dr. Sharlet Salina   Onset Date/Surgical Date 05/31/17   Hand Dominance Right   Next MD Visit unknown   Prior Therapy yes     Precautions   Precautions Fall  Balance Screen   Has the patient fallen in the past 6 months No   Has the patient had a decrease in activity level because of a fear of falling?  No   Is the patient reluctant to leave their home because of a fear of falling?  No     Home Social worker Private residence   Living Arrangements Children   Available Help at Discharge Family   Type of [place] to enter   Entrance Stairs-Number of Steps [place] One level   [place] - 4 wheels;Bedside commode;Other (comment)     Prior Function   Level of Independence Independent with basic ADLs   Vocation Retired   Leisure Likes to Licensed conveyancer   Overall Cognitive Status Within Functional Limits for tasks assessed     Observation/Other Assessments    Observations Max Stance tiime: 50sec     Sensation   Light Touch Appears Intact     Functional Tests   Functional tests Sit to Stand     Sit to Stand   Comments Slow to perform, body begins to shake once in standing position     Posture/Postural Control   Posture Comments Forward rounded shoulder, Forward head posture     AROM   Overall AROM  Within functional limits for tasks performed     Strength   Strength Assessment Site Hip;Knee;Ankle   Right/Left Hip Right;Left   Right Hip Flexion 4-/5   Right Hip ABduction 4-/5   Right Hip ADduction 3+/5   Left Hip Flexion 4-/5   Left Hip ABduction 4-/5   Left Hip ADduction 4-/5   Right/Left Knee Right;Left   Right Knee Flexion 4/5   Right Knee Extension 3+/5   Left Knee Flexion 4/5   Left Knee Extension 3+/5   Right/Left Ankle Right;Left   Right Ankle Dorsiflexion 4-/5   Right Ankle Plantar Flexion 3/5   Left Ankle Dorsiflexion 4-/5   Left Ankle Plantar Flexion 3/5     Transfers   Five time sit to stand comments  14sec from 22" height     Ambulation/Gait   Assistive device Rollator   Gait Pattern Decreased step length - right;Decreased step length - left;Step-through pattern;Right foot flat;Left foot flat;Shuffle   Ambulation Surface Level   Gait velocity .77 m/s   Gait Comments Decreased heel strike bilaterally patient ambulates with rollator far from BOS     6 minute walk test results    Aerobic Endurance Distance Walked 485   Endurance additional comments Required 2 sitting rest breaks and 2 standing rest breaks throughout motion     Standardized Balance Assessment   Standardized Balance Assessment Five Times Sit to Stand;Timed Up and Go Test;10 meter walk test;Berg Balance Test   10 Meter Walk .33m/s     Timed Up and Go Test   Normal TUG (seconds) 24     Objective measurements completed on examination: See above findings.   TREATMENT Therapeutic Exercise: Sit to stands without use of UE's from raised  table -- 2 x 5 reps Ambulation with focus on heel strike -- 479ft   Patient demonstrates increased fatigue at end of session      PT Education - 01/22/17 1028    Education provided Yes   Education Details HEP: Walking program, Sit to stands   [employer]) Educated Patient   Methods Demonstration;Explanation  Comprehension Verbalized understanding;Returned demonstration             PT Long Term Goals - 01/22/17 1054      PT LONG TERM GOAL #1   Title Patient will be independent with HEP to continue benefits of therapy after discharge.   Baseline Requires moderate cueing with performance of exercise   Time 8   Period Weeks   Status New     PT LONG TERM GOAL #2   Title Patient will improve TUG to under 12 seconds to indicate signficant improvement in fall risk.    Baseline TUG: 24sec   Time 8   Period Weeks   Status New     PT LONG TERM GOAL #3   Title Patient will improve 53minWT to over 1049ft to indicate functional improvement in walking tolerance and greater ability to walk in stores before requiring a sitting rest break   Baseline 453ft   Time 8   Period Weeks   Status New     PT LONG TERM GOAL #4   Title Patient will demonstrate ability to stand for over 2 min to allow for improved ability to perform household chores at home.    Baseline able to stand for 50sec before requiring a sitting rest break   Time 8   Period Weeks   Status New     PT LONG TERM GOAL #5   Title Pt will decrease 5TSTS to <12 seconds from a chair at 19in in height in order to demonstrate clinically significant improvement in LE strength   Baseline 5xSTS: 14 sec from 22" height chair   Time 8   Period Weeks   Status New                Plan - 01/22/17 1049    Clinical Impression Statement Patient is a 83 yo right hand dominant female presenting with increased balance difficulties and generalized weakness. Patient's imbalance is indicated by decreased 2minWT, 5XSTS, 65mWT, and TUG  scores. Patient also demonstrates decreased functional endurance and strength as indicated by poor 5XSTS, MMT, and max stance time scores. Patient will benefit from further skillled therapy focused on improving current limitations to return to prior level of function.    History and Personal Factors relevant to plan of care: History of weakness and balance difficulties   Clinical Presentation Stable   Clinical Presentation due to: Increased unsteadiness of feet, difficulties with walking   Clinical Decision Making Low   Rehab Potential Fair   Clinical Impairments Affecting Rehab Potential Positive: motivation; Negative: age, chronicity, bilateral knee pain   PT Frequency 2x / week   PT Duration 6 weeks   PT Treatment/Interventions ADLs/Self Care Home Management;Aquatic Therapy;Canalith Repostioning;Cryotherapy;Electrical Stimulation;Iontophoresis 4mg /ml Dexamethasone;Moist Heat;Traction;Ultrasound;DME Instruction;Gait training;Stair training;Functional mobility training;Therapeutic activities;Therapeutic exercise;Balance training;Neuromuscular re-education;Patient/family education;Wheelchair mobility training;Manual techniques;Passive range of motion;Vestibular   PT Next Visit Plan Progress strengthening, reinforce HEP   PT Home Exercise Plan Cervical retraction   Consulted and Agree with Plan of Care Patient;Family member/caregiver   Family Member Consulted Daughter      Patient will benefit from skilled therapeutic intervention in order to improve the following deficits and impairments:  Abnormal gait, Decreased strength, Obesity, Decreased activity tolerance, Decreased endurance, Difficulty walking, Decreased balance, Decreased mobility, Decreased coordination, Increased muscle spasms, Postural dysfunction, Hypomobility, Decreased range of motion  Visit Diagnosis: Difficulty in walking, not elsewhere classified - Plan: PT plan of care cert/re-cert  Unsteadiness on feet - Plan: PT plan of  care cert/re-cert  Muscle weakness (generalized) - Plan: PT plan of care cert/re-cert      G-Codes - 19/62/22 1102    Functional Assessment Tool Used (Outpatient Only) Clinical judgement, MMT, TUG, 5XSTS, 17mwt, 73minwt   Functional Limitation Mobility: Walking and moving around   Mobility: Walking and Moving Around Current Status [phone]) At least 40 percent but less than 60 percent impaired, limited or restricted   Mobility: Walking and Moving Around Goal Status [phone]) At least 20 percent but less than 40 percent impaired, limited or restricted       Problem List Patient Active Problem List   Diagnosis Date Noted  . Hemorrhoids 05/17/2016  . Obesity (BMI 30-39.9) 04/26/2016  . Multiple lung nodules 04/19/2016  . Chest pain 04/17/2016  . Partial symptomatic epilepsy with complex partial seizures, not intractable, without status epilepticus ([place]) 06/30/2015  . Bronchiectasis without complication ([place]) 17/40/8144  . Imbalance 11/08/2014  . Essential (primary) hypertension 11/08/2014  . Personal history of malignant neoplasm of breast 11/08/2014  . History of malignant carcinoid tumor of bronchus and lung 11/08/2014  . H/O peptic ulcer 11/08/2014  . Asthma, mild intermittent 11/08/2014  . PE (pulmonary embolism) 11/08/2014  . Gonalgia 11/08/2014  . Leg varices 11/08/2014    Blythe Stanford, PT DPT 01/22/2017, 11:14 AM  [facility] [address], [place], [address] Phone: [phone]   Fax:  [phone]  Name: Rebecca Wolf MRN: [id number] Date of Birth: 06-16-1925

## 2017-01-23 ENCOUNTER — Ambulatory Visit
Admission: RE | Admit: 2017-01-23 | Discharge: 2017-01-23 | Disposition: A | Discharge status: Home / Self Care | Payer: Medicare Other | Source: Ambulatory Visit | Attending: Specialist | Admitting: Specialist

## 2017-01-23 DIAGNOSIS — J479 Bronchiectasis, uncomplicated: Secondary | ICD-10-CM | POA: Insufficient documentation

## 2017-01-23 DIAGNOSIS — R918 Other nonspecific abnormal finding of lung field: Secondary | ICD-10-CM | POA: Diagnosis not present

## 2017-01-23 DIAGNOSIS — I2510 Atherosclerotic heart disease of native coronary artery without angina pectoris: Secondary | ICD-10-CM | POA: Diagnosis not present

## 2017-01-23 DIAGNOSIS — I358 Other nonrheumatic aortic valve disorders: Secondary | ICD-10-CM | POA: Diagnosis not present

## 2017-01-23 DIAGNOSIS — I700 Atherosclerosis of aorta: Secondary | ICD-10-CM | POA: Insufficient documentation

## 2017-01-24 ENCOUNTER — Ambulatory Visit: Payer: Medicare Other | Admitting: Podiatry

## 2017-01-29 ENCOUNTER — Ambulatory Visit: Payer: Medicare Other | Attending: Physical Medicine and Rehabilitation

## 2017-01-29 DIAGNOSIS — M6281 Muscle weakness (generalized): Secondary | ICD-10-CM | POA: Diagnosis not present

## 2017-01-29 DIAGNOSIS — R2681 Unsteadiness on feet: Secondary | ICD-10-CM

## 2017-01-29 DIAGNOSIS — R262 Difficulty in walking, not elsewhere classified: Secondary | ICD-10-CM | POA: Diagnosis not present

## 2017-01-29 NOTE — Therapy (Signed)
[facility] [address] [place], [address] Phone: [phone]   Fax:  [phone]  Physical Therapy Treatment  Patient Details  Name: Rebecca Wolf MRN: [id number] Date of Birth: 1924/10/29 Referring Provider: Dr. Sharlet Salina  Encounter Date: 01/29/2017      PT End of Session - 01/29/17 1010    Visit Number 2   Number of Visits 16   Date for PT Re-Evaluation 03/19/17   Authorization Type 2 / 10 G Code   PT Start Time 1004   PT Stop Time 1045   PT Time Calculation (min) 41 min   Equipment Utilized During Treatment Gait belt   Activity Tolerance Patient tolerated treatment well   Behavior During Therapy WFL for tasks assessed/performed      Past Medical History:  Diagnosis Date  . Asthma   . Breast cancer ([facility]) 2003   LT LUMPECTOMY  . Breast cancer ([facility]) 2004   LT LUMPECTOMY  . 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  . GERD (gastroesophageal reflux disease)   . Hemorrhoids   . Hypertension   . Personal history of chemotherapy 2004   BREAST CA  . Personal history of radiation therapy 2003   BREAST CA  . Personal history of radiation therapy 2004   BREAST CA  . Polyp of colon 2002   bleeding polyp of right ascending colon  . PUD (peptic ulcer disease)   . Seizures ([facility])    epilepsy well controlled    Past Surgical History:  Procedure Laterality Date  . BREAST EXCISIONAL BIOPSY Left 2003   positive  . BREAST EXCISIONAL BIOPSY Left 2004   positive  . BREAST LUMPECTOMY Left 2003   BREAST CA  . BREAST LUMPECTOMY Left 2004   BREAST CA  . HEMICOLECTOMY Right    bowel obstruction  . HEMORRHOID SURGERY    . THORACOTOMY/LOBECTOMY  1999   carcinoid tumor  . TOTAL VAGINAL HYSTERECTOMY    . VARICOSE VEIN SURGERY      There were no vitals filed for this visit.      Subjective Assessment - 01/29/17 1008    Subjective Patient reports she would like to work on walking, performing  sit to stands, and standing balance. Patient reports she felt tired after previous visit.    Patient is accompained by: --  Daughter   Pertinent History Previously seen for balance disorders and for neck pain over the past year.    Limitations Walking;Sitting   How long can you walk comfortably? 3 min    Diagnostic tests None   Patient Stated Goals To be able to walk without an walker.    Currently in Pain? No/denies   Pain Onset 1 to 4 weeks ago        TREATMENT: Therapeutic Exercise: Sit to stands - x 10, 2 x 5 without use of UEs Standing time for LE endurance without use of UEs - 36min x 2  Walking with walker for endurance and focus on Heel strike around gym - 2x 248ft Marches in standing with UE support - x20  Side stepping down and back -- ~ 79ft   Patient demonstrates increased LE weakness at the end of treatment session        PT Education - 01/29/17 1009    Education provided Yes   Education Details Form/technique with exercise   Person(s) Educated Patient   Methods Explanation;Demonstration   Comprehension Verbalized understanding;Returned demonstration  PT Long Term Goals - 01/22/17 1054      PT LONG TERM GOAL #1   Title Patient will be independent with HEP to continue benefits of therapy after discharge.   Baseline Requires moderate cueing with performance of exercise   Time 8   Period Weeks   Status New     PT LONG TERM GOAL #2   Title Patient will improve TUG to under 12 seconds to indicate signficant improvement in fall risk.    Baseline TUG: 24sec   Time 8   Period Weeks   Status New     PT LONG TERM GOAL #3   Title Patient will improve 52minWT to over 1073ft to indicate functional improvement in walking tolerance and greater ability to walk in stores before requiring a sitting rest break   Baseline 48ft   Time 8   Period Weeks   Status New     PT LONG TERM GOAL #4   Title Patient will demonstrate ability to stand for over 2  min to allow for improved ability to perform household chores at home.    Baseline able to stand for 50sec before requiring a sitting rest break   Time 8   Period Weeks   Status New     PT LONG TERM GOAL #5   Title Pt will decrease 5TSTS to <12 seconds from a chair at 19in in height in order to demonstrate clinically significant improvement in LE strength   Baseline 5xSTS: 14 sec from 22" height chair   Time 8   Period Weeks   Status New               Plan - 01/29/17 1015    Clinical Impression Statement Patient demonstrates improvement with standing tolerance with ability to stand for 1 min versus the 50sec the previous treatment. Patient continues to demonstrate considerable LE weakness most noteably in weight bearing demonstrating increased LE fasiculations with exercise performance. Patient will benefit from further skilled therapy focused on improving balance and LE strength to decrease fall risk.    Rehab Potential Fair   Clinical Impairments Affecting Rehab Potential Positive: motivation; Negative: age, chronicity, bilateral knee pain   PT Frequency 2x / week   PT Duration 6 weeks   PT Treatment/Interventions ADLs/Self Care Home Management;Aquatic Therapy;Canalith Repostioning;Cryotherapy;Electrical Stimulation;Iontophoresis 4mg /ml Dexamethasone;Moist Heat;Traction;Ultrasound;DME Instruction;Gait training;Stair training;Functional mobility training;Therapeutic activities;Therapeutic exercise;Balance training;Neuromuscular re-education;Patient/family education;Wheelchair mobility training;Manual techniques;Passive range of motion;Vestibular   PT Next Visit Plan Progress strengthening, reinforce HEP   PT Home Exercise Plan Cervical retraction   Consulted and Agree with Plan of Care Patient;Family member/caregiver   Family Member Consulted Daughter      Patient will benefit from skilled therapeutic intervention in order to improve the following deficits and impairments:   Abnormal gait, Decreased strength, Obesity, Decreased activity tolerance, Decreased endurance, Difficulty walking, Decreased balance, Decreased mobility, Decreased coordination, Increased muscle spasms, Postural dysfunction, Hypomobility, Decreased range of motion  Visit Diagnosis: Difficulty in walking, not elsewhere classified  Unsteadiness on feet  Muscle weakness (generalized)     Problem List Patient Active Problem List   Diagnosis Date Noted  . Hemorrhoids 05/17/2016  . Obesity (BMI 30-39.9) 04/26/2016  . Multiple lung nodules 04/19/2016  . Chest pain 04/17/2016  . Partial symptomatic epilepsy with complex partial seizures, not intractable, without status epilepticus ([facility]) 06/30/2015  . Bronchiectasis without complication ([facility]) 76/28/3151  . Imbalance 11/08/2014  . Essential (primary) hypertension 11/08/2014  . Personal history of malignant neoplasm of breast 11/08/2014  .  History of malignant carcinoid tumor of bronchus and lung 11/08/2014  . H/O peptic ulcer 11/08/2014  . Asthma, mild intermittent 11/08/2014  . PE (pulmonary embolism) 11/08/2014  . Gonalgia 11/08/2014  . Leg varices 11/08/2014    Blythe Stanford, PT DPT 01/29/2017, 10:34 AM  [facility] [address], [place], [address] Phone: [phone]   Fax:  [phone]  Name: LANNIE HEAPS MRN: [id number] Date of Birth: October 05, 1924

## 2017-01-31 ENCOUNTER — Ambulatory Visit: Payer: Medicare Other | Admitting: Podiatry

## 2017-02-05 ENCOUNTER — Ambulatory Visit: Payer: Medicare Other

## 2017-02-05 DIAGNOSIS — M6281 Muscle weakness (generalized): Secondary | ICD-10-CM | POA: Diagnosis not present

## 2017-02-05 DIAGNOSIS — R262 Difficulty in walking, not elsewhere classified: Secondary | ICD-10-CM

## 2017-02-05 DIAGNOSIS — R2681 Unsteadiness on feet: Secondary | ICD-10-CM | POA: Diagnosis not present

## 2017-02-05 NOTE — Therapy (Addendum)
[facility] [address] [place], [address] Phone: [phone]   Fax:  [phone]  Physical Therapy Treatment  Patient Details  Name: Rebecca Wolf MRN: [id number] Date of Birth: 12-08-1924 Referring Provider: Dr. Sharlet Salina  Encounter Date: 02/05/2017      PT End of Session - 02/05/17 1019    Visit Number 3   Number of Visits 16   Date for PT Re-Evaluation 03/19/17   Authorization Type 3 / 10 G Code   PT Start Time 1004   PT Stop Time 1045   PT Time Calculation (min) 41 min   Equipment Utilized During Treatment Gait belt   Activity Tolerance Patient tolerated treatment well   Behavior During Therapy WFL for tasks assessed/performed      Past Medical History:  Diagnosis Date  . Asthma   . Breast cancer ([facility]) 2003   LT LUMPECTOMY  . Breast cancer ([facility]) 2004   LT LUMPECTOMY  . 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  . GERD (gastroesophageal reflux disease)   . Hemorrhoids   . Hypertension   . Personal history of chemotherapy 2004   BREAST CA  . Personal history of radiation therapy 2003   BREAST CA  . Personal history of radiation therapy 2004   BREAST CA  . Polyp of colon 2002   bleeding polyp of right ascending colon  . PUD (peptic ulcer disease)   . Seizures ([facility])    epilepsy well controlled    Past Surgical History:  Procedure Laterality Date  . BREAST EXCISIONAL BIOPSY Left 2003   positive  . BREAST EXCISIONAL BIOPSY Left 2004   positive  . BREAST LUMPECTOMY Left 2003   BREAST CA  . BREAST LUMPECTOMY Left 2004   BREAST CA  . HEMICOLECTOMY Right    bowel obstruction  . HEMORRHOID SURGERY    . THORACOTOMY/LOBECTOMY  1999   carcinoid tumor  . TOTAL VAGINAL HYSTERECTOMY    . VARICOSE VEIN SURGERY      There were no vitals filed for this visit.      Subjective Assessment - 02/05/17 1011    Subjective Patient reports she walked at the [facility] to improve LE  strength and improving walking/standing endurance.    Patient is accompained by: --  Daughter   Pertinent History Previously seen for balance disorders and for neck pain over the past year.    Limitations Walking;Sitting   How long can you walk comfortably? 3 min    Diagnostic tests None   Patient Stated Goals To be able to walk without an walker.    Currently in Pain? No/denies   Pain Onset 1 to 4 weeks ago       TREATMENT: Therapeutic Exercise: Nustep level 3 - with cueing on improving speed of performance - 7 min  Standing time for LE endurance without use of UEs - 73sec, 67sec Sit to stands - x 10, x 10 without use of UEs Walking with walker for endurance and focus on Heel strike around gym - 3x 169ft; x 223ft   Patient demonstrates increased LE fatigue at the end of treatment session        PT Education - 02/05/17 1013    Education provided Yes   Education Details form/technique with exercise   Person(s) Educated Patient   Methods Explanation;Demonstration   Comprehension Verbalized understanding;Returned demonstration             PT  Long Term Goals - 01/22/17 1054      PT LONG TERM GOAL #1   Title Patient will be independent with HEP to continue benefits of therapy after discharge.   Baseline Requires moderate cueing with performance of exercise   Time 8   Period Weeks   Status New     PT LONG TERM GOAL #2   Title Patient will improve TUG to under 12 seconds to indicate signficant improvement in fall risk.    Baseline TUG: 24sec   Time 8   Period Weeks   Status New     PT LONG TERM GOAL #3   Title Patient will improve 43minWT to over 1033ft to indicate functional improvement in walking tolerance and greater ability to walk in stores before requiring a sitting rest break   Baseline 432ft   Time 8   Period Weeks   Status New     PT LONG TERM GOAL #4   Title Patient will demonstrate ability to stand for over 2 min to allow for improved ability to  perform household chores at home.    Baseline able to stand for 50sec before requiring a sitting rest break   Time 8   Period Weeks   Status New     PT LONG TERM GOAL #5   Title Pt will decrease 5TSTS to <12 seconds from a chair at 19in in height in order to demonstrate clinically significant improvement in LE strength   Baseline 5xSTS: 14 sec from 22" height chair   Time 8   Period Weeks   Status New               Plan - 02/05/17 1022    Clinical Impression Statement Patient demonstrates improvement with standing tolerance with ability to stand for 10 sec longer on average than the previous treatment. Patient demonstrates increased fasciulations when standing indicating increased muscular fatigue and weakness. Patient also requires frequent sitting rest breaks throughout session secondary to fatigue. Patient will benefit from further skilled therapy to return to prior level of function.    Rehab Potential Fair   Clinical Impairments Affecting Rehab Potential Positive: motivation; Negative: age, chronicity, bilateral knee pain   PT Frequency 2x / week   PT Duration 6 weeks   PT Treatment/Interventions ADLs/Self Care Home Management;Aquatic Therapy;Canalith Repostioning;Cryotherapy;Electrical Stimulation;Iontophoresis 4mg /ml Dexamethasone;Moist Heat;Traction;Ultrasound;DME Instruction;Gait training;Stair training;Functional mobility training;Therapeutic activities;Therapeutic exercise;Balance training;Neuromuscular re-education;Patient/family education;Wheelchair mobility training;Manual techniques;Passive range of motion;Vestibular   PT Next Visit Plan Progress strengthening, reinforce HEP   PT Home Exercise Plan Cervical retraction   Consulted and Agree with Plan of Care Patient;Family member/caregiver   Family Member Consulted Daughter      Patient will benefit from skilled therapeutic intervention in order to improve the following deficits and impairments:  Abnormal gait,  Decreased strength, Obesity, Decreased activity tolerance, Decreased endurance, Difficulty walking, Decreased balance, Decreased mobility, Decreased coordination, Increased muscle spasms, Postural dysfunction, Hypomobility, Decreased range of motion  Visit Diagnosis: Difficulty in walking, not elsewhere classified  Unsteadiness on feet  Muscle weakness (generalized)     Problem List Patient Active Problem List   Diagnosis Date Noted  . Hemorrhoids 05/17/2016  . Obesity (BMI 30-39.9) 04/26/2016  . Multiple lung nodules 04/19/2016  . Chest pain 04/17/2016  . Partial symptomatic epilepsy with complex partial seizures, not intractable, without status epilepticus ([facility]) 06/30/2015  . Bronchiectasis without complication ([facility]) 07/62/2633  . Imbalance 11/08/2014  . Essential (primary) hypertension 11/08/2014  . Personal history of malignant neoplasm of breast  11/08/2014  . History of malignant carcinoid tumor of bronchus and lung 11/08/2014  . H/O peptic ulcer 11/08/2014  . Asthma, mild intermittent 11/08/2014  . PE (pulmonary embolism) 11/08/2014  . Gonalgia 11/08/2014  . Leg varices 11/08/2014    Blythe Stanford, PT DPT 02/05/2017, 10:28 AM  [facility] [address], [place], [address] Phone: [phone]   Fax:  [phone]  Name: Rebecca Wolf MRN: [id number] Date of Birth: 10/22/1924

## 2017-02-12 ENCOUNTER — Ambulatory Visit: Payer: Medicare Other

## 2017-02-12 DIAGNOSIS — R262 Difficulty in walking, not elsewhere classified: Secondary | ICD-10-CM | POA: Diagnosis not present

## 2017-02-12 DIAGNOSIS — R2681 Unsteadiness on feet: Secondary | ICD-10-CM

## 2017-02-12 DIAGNOSIS — M6281 Muscle weakness (generalized): Secondary | ICD-10-CM | POA: Diagnosis not present

## 2017-02-12 NOTE — Therapy (Signed)
[facility] [address] [place], [address] Phone: [phone]   Fax:  [phone]  Physical Therapy Treatment  Patient Details  Name: Rebecca Wolf MRN: [id number] Date of Birth: 1925/02/16 Referring Provider: Dr. Sharlet Salina  Encounter Date: 02/12/2017      PT End of Session - 02/12/17 1010    Visit Number 4   Number of Visits 16   Date for PT Re-Evaluation 03/19/17   Authorization Type 4 / 10 G Code   PT Start Time 2423   PT Stop Time 1045   PT Time Calculation (min) 43 min   Equipment Utilized During Treatment Gait belt   Activity Tolerance Patient tolerated treatment well   Behavior During Therapy WFL for tasks assessed/performed      Past Medical History:  Diagnosis Date  . Asthma   . Breast cancer ([facility]) 2003   LT LUMPECTOMY  . Breast cancer ([facility]) 2004   LT LUMPECTOMY  . 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  . GERD (gastroesophageal reflux disease)   . Hemorrhoids   . Hypertension   . Personal history of chemotherapy 2004   BREAST CA  . Personal history of radiation therapy 2003   BREAST CA  . Personal history of radiation therapy 2004   BREAST CA  . Polyp of colon 2002   bleeding polyp of right ascending colon  . PUD (peptic ulcer disease)   . Seizures ([facility])    epilepsy well controlled    Past Surgical History:  Procedure Laterality Date  . BREAST EXCISIONAL BIOPSY Left 2003   positive  . BREAST EXCISIONAL BIOPSY Left 2004   positive  . BREAST LUMPECTOMY Left 2003   BREAST CA  . BREAST LUMPECTOMY Left 2004   BREAST CA  . HEMICOLECTOMY Right    bowel obstruction  . HEMORRHOID SURGERY    . THORACOTOMY/LOBECTOMY  1999   carcinoid tumor  . TOTAL VAGINAL HYSTERECTOMY    . VARICOSE VEIN SURGERY      There were no vitals filed for this visit.      Subjective Assessment - 02/12/17 1008    Subjective Patient reports no new changes since the previous visit.  Patient reports she went walking around stores and states she's been "doing pretty good".   Patient is accompained by: --  Daughter   Pertinent History Previously seen for balance disorders and for neck pain over the past year.    Limitations Walking;Sitting   How long can you walk comfortably? 3 min    Diagnostic tests None   Patient Stated Goals To be able to walk without an walker.    Currently in Pain? No/denies   Pain Onset 1 to 4 weeks ago      TREATMENT: Therapeutic Exercise: Nustep level 3 - with cueing on improving speed of performance - 7 min  Standing heel/toe raises in standing for balance - 3 x 10 in each direction Standing marches in standing with intermittent UE support - 2 x 10  Squats in standing with UE support 2 x 10  Walking with walker for endurance and focus on Heel strike around gym - x363ft( Requires 4 standing rest breaks;  Standing time for LE endurance without use of UEs - 65sec   Patient demonstrates increased LE fatigue at the end of treatment session       PT Education - 02/12/17 1009    Education provided Yes   Education Details form/technique  with exercise   Person(s) Educated Patient   Methods Explanation;Demonstration   Comprehension Verbalized understanding;Returned demonstration             PT Long Term Goals - 01/22/17 1054      PT LONG TERM GOAL #1   Title Patient will be independent with HEP to continue benefits of therapy after discharge.   Baseline Requires moderate cueing with performance of exercise   Time 8   Period Weeks   Status New     PT LONG TERM GOAL #2   Title Patient will improve TUG to under 12 seconds to indicate signficant improvement in fall risk.    Baseline TUG: 24sec   Time 8   Period Weeks   Status New     PT LONG TERM GOAL #3   Title Patient will improve 2minWT to over 1040ft to indicate functional improvement in walking tolerance and greater ability to walk in stores before requiring a sitting rest  break   Baseline 49ft   Time 8   Period Weeks   Status New     PT LONG TERM GOAL #4   Title Patient will demonstrate ability to stand for over 2 min to allow for improved ability to perform household chores at home.    Baseline able to stand for 50sec before requiring a sitting rest break   Time 8   Period Weeks   Status New     PT LONG TERM GOAL #5   Title Pt will decrease 5TSTS to <12 seconds from a chair at 19in in height in order to demonstrate clinically significant improvement in LE strength   Baseline 5xSTS: 14 sec from 22" height chair   Time 8   Period Weeks   Status New               Plan - 02/12/17 1018    Clinical Impression Statement Patient requires frequent sitting rest breaks throughout session but is able to do greater amount of exercises compared to previous treatment sessions. Although patient is improving she continues to demonstrate increased difficulty with performing sit to stands and walking for a prolonged period of time. Patient will benefit from further skilled therapy focused on improving current limitations to return to prior level of function.    Rehab Potential Fair   Clinical Impairments Affecting Rehab Potential Positive: motivation; Negative: age, chronicity, bilateral knee pain   PT Frequency 2x / week   PT Duration 6 weeks   PT Treatment/Interventions ADLs/Self Care Home Management;Aquatic Therapy;Canalith Repostioning;Cryotherapy;Electrical Stimulation;Iontophoresis 4mg /ml Dexamethasone;Moist Heat;Traction;Ultrasound;DME Instruction;Gait training;Stair training;Functional mobility training;Therapeutic activities;Therapeutic exercise;Balance training;Neuromuscular re-education;Patient/family education;Wheelchair mobility training;Manual techniques;Passive range of motion;Vestibular   PT Next Visit Plan Progress strengthening, reinforce HEP   PT Home Exercise Plan Cervical retraction   Consulted and Agree with Plan of Care Patient;Family  member/caregiver   Family Member Consulted Daughter      Patient will benefit from skilled therapeutic intervention in order to improve the following deficits and impairments:  Abnormal gait, Decreased strength, Obesity, Decreased activity tolerance, Decreased endurance, Difficulty walking, Decreased balance, Decreased mobility, Decreased coordination, Increased muscle spasms, Postural dysfunction, Hypomobility, Decreased range of motion  Visit Diagnosis: Difficulty in walking, not elsewhere classified  Unsteadiness on feet  Muscle weakness (generalized)     Problem List Patient Active Problem List   Diagnosis Date Noted  . Hemorrhoids 05/17/2016  . Obesity (BMI 30-39.9) 04/26/2016  . Multiple lung nodules 04/19/2016  . Chest pain 04/17/2016  . Partial symptomatic epilepsy with  complex partial seizures, not intractable, without status epilepticus ([facility]) 06/30/2015  . Bronchiectasis without complication ([facility]) 25/10/3974  . Imbalance 11/08/2014  . Essential (primary) hypertension 11/08/2014  . Personal history of malignant neoplasm of breast 11/08/2014  . History of malignant carcinoid tumor of bronchus and lung 11/08/2014  . H/O peptic ulcer 11/08/2014  . Asthma, mild intermittent 11/08/2014  . PE (pulmonary embolism) 11/08/2014  . Gonalgia 11/08/2014  . Leg varices 11/08/2014    Blythe Stanford, PT DPT 02/12/2017, 10:45 AM  [facility] [address] [place], [address] Phone: [phone]   Fax:  [phone]  Name: Rebecca Wolf MRN: [id number] Date of Birth: [birth year]

## 2017-02-13 ENCOUNTER — Encounter: Payer: Self-pay | Admitting: Internal Medicine

## 2017-02-13 ENCOUNTER — Ambulatory Visit (INDEPENDENT_AMBULATORY_CARE_PROVIDER_SITE_OTHER): Payer: Medicare Other | Admitting: Internal Medicine

## 2017-02-13 VITALS — BP 120/74 | HR 72 | Ht 63.0 in | Wt 178.0 lb

## 2017-02-13 DIAGNOSIS — N3000 Acute cystitis without hematuria: Secondary | ICD-10-CM

## 2017-02-13 DIAGNOSIS — I8390 Asymptomatic varicose veins of unspecified lower extremity: Secondary | ICD-10-CM

## 2017-02-13 DIAGNOSIS — R600 Localized edema: Secondary | ICD-10-CM | POA: Insufficient documentation

## 2017-02-13 LAB — POC URINALYSIS WITH MICROSCOPIC (NON AUTO)MANUAL RESULT
Crystals: 0
EPITHELIAL CELLS, URINE PER MICROSCOPY: 2
Mucus, UA: 0
Nitrite, UA: NEGATIVE
RBC UA: NEGATIVE
RBC: 0 M/uL — AB (ref 4.04–5.48)
Spec Grav, UA: 1.01 (ref 1.010–1.025)
Urobilinogen, UA: 0.2 E.U./dL
WBC Casts, UA: 20
pH, UA: 5 (ref 5.0–8.0)

## 2017-02-13 MED ORDER — CEFUROXIME AXETIL 250 MG PO TABS: 250.0000 mg | ORAL_TABLET | Freq: Two times a day (BID) | ORAL | 0 refills | Status: AC

## 2017-02-13 NOTE — Progress Notes (Signed)
Date:  02/13/2017   Name:  Rebecca Wolf   DOB:  10-Dec-1924   MRN:  [id number]   Chief Complaint: Urinary Tract Infection (2 weeks. Strong odor in urine. Seems to urinate more frequently during the day not at night. ); Edema (Having Swelling for 2.5 weeks now in ankles and legs. See's PT once a week for 45 mins. States compression stockings and elevating legs do not help. ); and Diarrhea (Supposed to be taking fibercon mixed in with juice and pt is out of carafate medication. They believe this is the problem. Daughter states she will get her a probiotic and fibercon today. )  Urinary Tract Infection   This is a new problem. The current episode started in the past 7 days. The problem has been unchanged. The quality of the pain is described as burning. The pain is mild. There has been no fever. Pertinent negatives include no chills, hematuria or vomiting. She has tried nothing for the symptoms.   Edema - soft tissue swelling of both lower legs over the past couple of weeks. Skin his never been tight or split. Not tender red or warm. She denies any injury. No increase in sodium intake. She does continue on physical therapy and seems to be gaining strength. Compression stockings and elevation of bili not been beneficial to the swelling.   Review of Systems  Constitutional: Negative for chills, fatigue and fever.  Respiratory: Negative for chest tightness, shortness of breath and wheezing.   Cardiovascular: Positive for leg swelling. Negative for chest pain and palpitations.  Gastrointestinal: Positive for diarrhea. Negative for abdominal pain and vomiting.  Genitourinary: Positive for dysuria (and odor to urine). Negative for hematuria and vaginal pain.  Skin: Negative for color change and wound.  Psychiatric/Behavioral: Negative for sleep disturbance.    Patient Active Problem List   Diagnosis Date Noted  . Hemorrhoids 05/17/2016  . Obesity (BMI 30-39.9) 04/26/2016  . Multiple lung nodules  04/19/2016  . Chest pain 04/17/2016  . Partial symptomatic epilepsy with complex partial seizures, not intractable, without status epilepticus ([place]) 06/30/2015  . Bronchiectasis without complication ([place]) 83/38/2505  . Imbalance 11/08/2014  . Essential (primary) hypertension 11/08/2014  . Personal history of malignant neoplasm of breast 11/08/2014  . History of malignant carcinoid tumor of bronchus and lung 11/08/2014  . H/O peptic ulcer 11/08/2014  . Asthma, mild intermittent 11/08/2014  . PE (pulmonary embolism) 11/08/2014  . Gonalgia 11/08/2014  . Leg varices 11/08/2014    Prior to Admission medications   Medication Sig Start Date End Date Taking? Authorizing Provider  acetaminophen (TYLENOL) 500 MG tablet Take 500 mg by mouth every 6 (six) hours as needed.   Yes [provider]  albuterol (ACCUNEB) 1.25 MG/3ML nebulizer solution Inhale 1 ampule into the lungs 4 (four) times daily as needed. 05/06/14  Yes [provider]  albuterol (PROVENTIL HFA;VENTOLIN HFA) 108 (90 BASE) MCG/ACT inhaler Inhale 1 puff into the lungs 4 (four) times daily.   Yes [provider]  albuterol (PROVENTIL) (2.5 MG/3ML) 0.083% nebulizer solution USE 1 VIAL VIA NEBULIZER EVERY 6 HOURS AS NEEDED FOR WHEEZING 06/26/16  Yes Glean Hess, MD  amLODipine (NORVASC) 5 MG tablet TAKE 1 TABLET BY MOUTH EVERY DAY 10/27/16  Yes Glean Hess, MD  ELIQUIS 2.5 MG TABS tablet TAKE 1 TABLET BY MOUTH TWICE DAILY 09/25/15  Yes Glean Hess, MD  fluticasone [facility]) 50 MCG/ACT nasal spray Place 2 sprays into the nose daily.  Yes [provider]  Fluticasone-Salmeterol (ADVAIR) 500-50 MCG/DOSE AEPB Inhale 1 puff into the lungs 2 (two) times daily.   Yes [provider]  levETIRAcetam (KEPPRA) 750 MG tablet Take 1 tablet by mouth 2 (two) times daily. 06/29/13  Yes [provider]  montelukast (SINGULAIR) 10 MG tablet Take 1 tablet by mouth daily.   Yes [provider]  pantoprazole (PROTONIX) 40 MG tablet TAKE 1 TABLET BY MOUTH TWICE DAILY 12/12/16  Yes Glean Hess, MD  spironolactone-hydrochlorothiazide (ALDACTAZIDE) 25-25 MG tablet TAKE 1 TABLET BY MOUTH DAILY 01/12/17  Yes Glean Hess, MD  sucralfate (CARAFATE) 1 g tablet TK 1 T PO QID WITH MEALS AND HS 07/03/16  Yes [provider]  sucralfate (CARAFATE) 1 g tablet Take 1 tablet (1 g total) by mouth 4 (four) times daily -  with meals and at bedtime. Patient not taking: Reported on 02/13/2017 08/20/16   Jonathon Bellows, MD    Allergies  Allergen Reactions  . Levofloxacin   . Sulfa Antibiotics     Past Surgical History:  Procedure Laterality Date  . BREAST EXCISIONAL BIOPSY Left 2003   positive  . BREAST EXCISIONAL BIOPSY Left 2004   positive  . BREAST LUMPECTOMY Left 2003   BREAST CA  . BREAST LUMPECTOMY Left 2004   BREAST CA  . HEMICOLECTOMY Right    bowel obstruction  . HEMORRHOID SURGERY    . THORACOTOMY/LOBECTOMY  1999   carcinoid tumor  . TOTAL VAGINAL HYSTERECTOMY    . VARICOSE VEIN SURGERY      Social History  Substance Use Topics  . Smoking status: Former Research scientist (life sciences)  . Smokeless tobacco: Never Used  . Alcohol use No     Medication list has been reviewed and updated.   Physical Exam  Constitutional: She is oriented to person, place, and time. She appears well-developed and well-nourished. No distress.  HENT:  Head: Normocephalic and atraumatic.  Neck: Normal range of motion. No thyromegaly present.  Cardiovascular: Normal rate and normal heart sounds.   No murmur heard. Pulmonary/Chest: Effort normal and breath sounds normal. No respiratory distress. She has no wheezes. She has no rales.  Abdominal: Soft. There is no tenderness.  Musculoskeletal: She exhibits edema (1+ edema to mid tibia bilaterally).  Neurological: She is alert and oriented to person, place, and time.  Skin: Skin is warm and dry. No rash noted.  Psychiatric: She has a normal  mood and affect. Her behavior is normal. Thought content normal.  Nursing note and vitals reviewed.   BP 120/74   Pulse 72   Ht 5\' 3"  (1.6 m)   Wt 178 lb (80.7 kg)   SpO2 96%   BMI 31.53 kg/m   Assessment and Plan: 1. Acute cystitis without hematuria Increase fluids Return if sx persist for Cx - POC urinalysis w microscopic (non auto) - cefUROXime (CEFTIN) 250 MG tablet; Take 1 tablet (250 mg total) by mouth 2 (two) times daily with a meal.  Dispense: 14 tablet; Refill: 0  2. Leg varices stable  3. Localized edema Continue elevation and stockings Diuretics not indicated due to mild severity   Meds ordered this encounter  Medications  . cefUROXime (CEFTIN) 250 MG tablet    Sig: Take 1 tablet (250 mg total) by mouth 2 (two) times daily with a meal.    Dispense:  14 tablet    Refill:  0    Halina Maidens, MD [facility]  02/13/2017

## 2017-02-14 ENCOUNTER — Ambulatory Visit: Payer: Self-pay | Admitting: Internal Medicine

## 2017-02-17 ENCOUNTER — Ambulatory Visit: Payer: Self-pay | Admitting: Internal Medicine

## 2017-02-19 ENCOUNTER — Ambulatory Visit: Payer: Medicare Other

## 2017-02-19 DIAGNOSIS — R2681 Unsteadiness on feet: Secondary | ICD-10-CM

## 2017-02-19 DIAGNOSIS — M6281 Muscle weakness (generalized): Secondary | ICD-10-CM

## 2017-02-19 DIAGNOSIS — R262 Difficulty in walking, not elsewhere classified: Secondary | ICD-10-CM | POA: Diagnosis not present

## 2017-02-19 NOTE — Therapy (Signed)
[facility] [address], [place], [address] Phone: [phone]   Fax:  [phone]  Physical Therapy Treatment  Patient Details  Name: Rebecca Wolf MRN: [id number] Date of Birth: 01-18-1925 Referring Provider: Dr. Sharlet Salina  Encounter Date: 02/19/2017      PT End of Session - 02/19/17 1014    Visit Number 5   Number of Visits 16   Date for PT Re-Evaluation 03/19/17   Authorization Type 5 / 10 G Code   PT Start Time 1005   PT Stop Time 1045   PT Time Calculation (min) 40 min   Equipment Utilized During Treatment Gait belt   Activity Tolerance Patient tolerated treatment well   Behavior During Therapy [facility] for tasks assessed/performed      Past Medical History:  Diagnosis Date  . Asthma   . Breast cancer ([place]) 2003   LT LUMPECTOMY  . Breast cancer ([place]) 2004   LT LUMPECTOMY  . 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  . GERD (gastroesophageal reflux disease)   . Hemorrhoids   . Hypertension   . Personal history of chemotherapy 2004   BREAST CA  . Personal history of radiation therapy 2003   BREAST CA  . Personal history of radiation therapy 2004   BREAST CA  . Polyp of colon 2002   bleeding polyp of right ascending colon  . PUD (peptic ulcer disease)   . Seizures ([place])    epilepsy well controlled    Past Surgical History:  Procedure Laterality Date  . BREAST EXCISIONAL BIOPSY Left 2003   positive  . BREAST EXCISIONAL BIOPSY Left 2004   positive  . BREAST LUMPECTOMY Left 2003   BREAST CA  . BREAST LUMPECTOMY Left 2004   BREAST CA  . HEMICOLECTOMY Right    bowel obstruction  . HEMORRHOID SURGERY    . THORACOTOMY/LOBECTOMY  1999   carcinoid tumor  . TOTAL VAGINAL HYSTERECTOMY    . VARICOSE VEIN SURGERY      There were no vitals filed for this visit.      Subjective Assessment - 02/19/17 1010    Subjective Patient reports no major changes with her LE and weakness.  Patient states her husband fell over the weekend which drained a lot of her energy.   Patient is accompained by: --  Daughter   Pertinent History Previously seen for balance disorders and for neck pain over the past year.    Limitations Walking;Sitting   How long can you walk comfortably? 3 min    Diagnostic tests None   Patient Stated Goals To be able to walk without an walker.    Currently in Pain? No/denies   Pain Onset 1 to 4 weeks ago        TREATMENT: Therapeutic Exercise: Nustep level 4 - with cueing on improving speed of performance - 7 min  Walking with walker for endurance and focus on Heel strike around gym - x38ft( Requires 1 standing rest breaks, x262ft without standing rest breaks  Standing time for LE endurance without use of UEs - 68sec, 55sec, 51sec Side stepping at side of treadmill down and back with UE support - x5 laps (~74ft each way)  Patient demonstrates increased LE fatigue at the end of treatment session        PT Education - 02/19/17 1014    Education provided Yes   Education Details form/technique with exercise   Person(s) Educated Patient  Methods Explanation;Demonstration   Comprehension Verbalized understanding;Returned demonstration             PT Long Term Goals - 01/22/17 1054      PT LONG TERM GOAL #1   Title Patient will be independent with HEP to continue benefits of therapy after discharge.   Baseline Requires moderate cueing with performance of exercise   Time 8   Period Weeks   Status New     PT LONG TERM GOAL #2   Title Patient will improve TUG to under 12 seconds to indicate signficant improvement in fall risk.    Baseline TUG: 24sec   Time 8   Period Weeks   Status New     PT LONG TERM GOAL #3   Title Patient will improve 37minWT to over 1046ft to indicate functional improvement in walking tolerance and greater ability to walk in stores before requiring a sitting rest break   Baseline 428ft   Time 8   Period Weeks    Status New     PT LONG TERM GOAL #4   Title Patient will demonstrate ability to stand for over 2 min to allow for improved ability to perform household chores at home.    Baseline able to stand for 50sec before requiring a sitting rest break   Time 8   Period Weeks   Status New     PT LONG TERM GOAL #5   Title Pt will decrease 5TSTS to <12 seconds from a chair at 19in in height in order to demonstrate clinically significant improvement in LE strength   Baseline 5xSTS: 14 sec from 22" height chair   Time 8   Period Weeks   Status New               Plan - 02/19/17 1015    Clinical Impression Statement Patient continues to requires frequent rest breaks throuhgout session but is able to perform greater amount of standing for time before requiring a sitting rest break. Patient demosntrates improvement with exercise performance as indicated by increased repetitions of exercises performed. Patient will benefit from further skilled therapy to return to prior level of function.    Rehab Potential Fair   Clinical Impairments Affecting Rehab Potential Positive: motivation; Negative: age, chronicity, bilateral knee pain   PT Frequency 2x / week   PT Duration 6 weeks   PT Treatment/Interventions ADLs/Self Care Home Management;Aquatic Therapy;Canalith Repostioning;Cryotherapy;Electrical Stimulation;Iontophoresis 4mg /ml Dexamethasone;Moist Heat;Traction;Ultrasound;DME Instruction;Gait training;Stair training;Functional mobility training;Therapeutic activities;Therapeutic exercise;Balance training;Neuromuscular re-education;Patient/family education;Wheelchair mobility training;Manual techniques;Passive range of motion;Vestibular   PT Next Visit Plan Progress strengthening, reinforce HEP   PT Home Exercise Plan Cervical retraction   Consulted and Agree with Plan of Care Patient;Family member/caregiver   Family Member Consulted Daughter      Patient will benefit from skilled therapeutic  intervention in order to improve the following deficits and impairments:  Abnormal gait, Decreased strength, Obesity, Decreased activity tolerance, Decreased endurance, Difficulty walking, Decreased balance, Decreased mobility, Decreased coordination, Increased muscle spasms, Postural dysfunction, Hypomobility, Decreased range of motion  Visit Diagnosis: Difficulty in walking, not elsewhere classified  Unsteadiness on feet  Muscle weakness (generalized)     Problem List Patient Active Problem List   Diagnosis Date Noted  . Localized edema 02/13/2017  . Hemorrhoids 05/17/2016  . Obesity (BMI 30-39.9) 04/26/2016  . Multiple lung nodules 04/19/2016  . Chest pain 04/17/2016  . Partial symptomatic epilepsy with complex partial seizures, not intractable, without status epilepticus ([place]) 06/30/2015  . Bronchiectasis without  complication ([place]) 16/04/9603  . Imbalance 11/08/2014  . Essential (primary) hypertension 11/08/2014  . Personal history of malignant neoplasm of breast 11/08/2014  . History of malignant carcinoid tumor of bronchus and lung 11/08/2014  . H/O peptic ulcer 11/08/2014  . Asthma, mild intermittent 11/08/2014  . PE (pulmonary embolism) 11/08/2014  . Gonalgia 11/08/2014  . Leg varices 11/08/2014    Blythe Stanford, PT DPT 02/19/2017, 10:45 AM  [facility] [address], [place], [address] Phone: [phone]   Fax:  [phone]  Name: Rebecca Wolf MRN: [id number] Date of Birth: 1925-06-19

## 2017-02-25 ENCOUNTER — Ambulatory Visit: Payer: Medicare Other | Admitting: Podiatry

## 2017-02-25 DIAGNOSIS — R918 Other nonspecific abnormal finding of lung field: Secondary | ICD-10-CM | POA: Diagnosis not present

## 2017-02-25 DIAGNOSIS — J479 Bronchiectasis, uncomplicated: Secondary | ICD-10-CM | POA: Diagnosis not present

## 2017-02-26 ENCOUNTER — Ambulatory Visit: Payer: Medicare Other

## 2017-03-04 ENCOUNTER — Ambulatory Visit (INDEPENDENT_AMBULATORY_CARE_PROVIDER_SITE_OTHER): Payer: Medicare Other | Admitting: Podiatry

## 2017-03-04 DIAGNOSIS — M79676 Pain in unspecified toe(s): Secondary | ICD-10-CM

## 2017-03-04 DIAGNOSIS — B351 Tinea unguium: Secondary | ICD-10-CM

## 2017-03-06 NOTE — Progress Notes (Signed)
   SUBJECTIVE Patient  presents to office today complaining of elongated, thickened nails. Pain while ambulating in shoes. Patient is unable to trim their own nails.   Past Medical History:  Diagnosis Date  . Asthma   . Breast cancer ([place]) 2003   LT LUMPECTOMY  . Breast cancer ([place]) 2004   LT LUMPECTOMY  . 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  . GERD (gastroesophageal reflux disease)   . Hemorrhoids   . Hypertension   . Personal history of chemotherapy 2004   BREAST CA  . Personal history of radiation therapy 2003   BREAST CA  . Personal history of radiation therapy 2004   BREAST CA  . Polyp of colon 2002   bleeding polyp of right ascending colon  . PUD (peptic ulcer disease)   . Seizures ([place])    epilepsy well controlled    OBJECTIVE General Patient is awake, alert, and oriented x 3 and in no acute distress. Derm Skin is dry and supple bilateral. Negative open lesions or macerations. Remaining integument unremarkable. Nails are tender, long, thickened and dystrophic with subungual debris, consistent with onychomycosis, 1-5 bilateral. No signs of infection noted. Vasc  DP and PT pedal pulses palpable bilaterally. Temperature gradient within normal limits.  Neuro Epicritic and protective threshold sensation diminished bilaterally.  Musculoskeletal Exam No symptomatic pedal deformities noted bilateral. Muscular strength within normal limits.  ASSESSMENT 1. Onychodystrophic nails 1-5 bilateral with hyperkeratosis of nails.  2. Onychomycosis of nail due to dermatophyte bilateral 3. Pain in foot bilateral  PLAN OF CARE 1. Patient evaluated today.  2. Instructed to maintain good pedal hygiene and foot care.  3. Mechanical debridement of nails 1-5 bilaterally performed using a nail nipper. Filed with dremel without incident.  4. Return to clinic in 3 mos.    Edrick Kins, DPM [facility]  Dr. Edrick Kins, [place]                                         [place], [place] [address]                Office [phone]  Fax [phone]

## 2017-03-12 ENCOUNTER — Ambulatory Visit: Payer: Medicare Other | Attending: Physical Medicine and Rehabilitation

## 2017-03-12 DIAGNOSIS — R2681 Unsteadiness on feet: Secondary | ICD-10-CM

## 2017-03-12 DIAGNOSIS — R262 Difficulty in walking, not elsewhere classified: Secondary | ICD-10-CM | POA: Diagnosis not present

## 2017-03-12 DIAGNOSIS — M6281 Muscle weakness (generalized): Secondary | ICD-10-CM | POA: Diagnosis not present

## 2017-03-12 NOTE — Therapy (Signed)
[facility] [address] [place], [address] Phone: [phone]   Fax:  [phone]  Physical Therapy Treatment  Patient Details  Name: Rebecca Wolf MRN: [id number] Date of Birth: [birth year] Referring Provider: Dr. Sharlet Salina  Encounter Date: 03/12/2017      PT End of Session - 03/12/17 1043    Visit Number 6   Number of Visits 16   Date for PT Re-Evaluation 03/19/17   Authorization Type 6 / 10 G Code   PT Start Time 1030   PT Stop Time 1115   PT Time Calculation (min) 45 min   Equipment Utilized During Treatment Gait belt   Activity Tolerance Patient tolerated treatment well   Behavior During Therapy [facility] for tasks assessed/performed      Past Medical History:  Diagnosis Date  . Asthma   . Breast cancer ([place]) 2003   LT LUMPECTOMY  . Breast cancer ([place]) 2004   LT LUMPECTOMY  . 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  . GERD (gastroesophageal reflux disease)   . Hemorrhoids   . Hypertension   . Personal history of chemotherapy 2004   BREAST CA  . Personal history of radiation therapy 2003   BREAST CA  . Personal history of radiation therapy 2004   BREAST CA  . Polyp of colon 2002   bleeding polyp of right ascending colon  . PUD (peptic ulcer disease)   . Seizures ([place])    epilepsy well controlled    Past Surgical History:  Procedure Laterality Date  . BREAST EXCISIONAL BIOPSY Left 2003   positive  . BREAST EXCISIONAL BIOPSY Left 2004   positive  . BREAST LUMPECTOMY Left 2003   BREAST CA  . BREAST LUMPECTOMY Left 2004   BREAST CA  . HEMICOLECTOMY Right    bowel obstruction  . HEMORRHOID SURGERY    . THORACOTOMY/LOBECTOMY  1999   carcinoid tumor  . TOTAL VAGINAL HYSTERECTOMY    . VARICOSE VEIN SURGERY      There were no vitals filed for this visit.      Subjective Assessment - 03/12/17 1041    Subjective Patient reports she has not been performing her exercises and  feels like her legs have been feeling weak.    Patient is accompained by: --  Daughter   Pertinent History Previously seen for balance disorders and for neck pain over the past year.    Limitations Walking;Sitting   How long can you walk comfortably? 3 min    Diagnostic tests None   Patient Stated Goals To be able to walk without an walker.    Currently in Pain? No/denies   Pain Onset 1 to 4 weeks ago         TREATMENT: Therapeutic Exercise Sit to stands - 4 x 5 reps with use of UEs Nustep in sitting - x10 min with resistance at level 4 and cueing on improving speed of performance Ambulating around gym with [facility] and educating on proper UE/LE progression with movement. - 50ft x 2 Heel raises in standing with use of UEs - x 15  * Patient required frequent rest breaks throughout treatment session Other: Modified Rollator walker's brakes to improve contact with wheels. Unable to completely improve the wheel issue secondary to unavailability of tools. Patient demonstrates increased LE fatigue at end of session        PT Education - 03/12/17 1043    Education provided Yes  Education Details form/technique with exercise   Person(s) Educated Patient   Methods Explanation;Demonstration   Comprehension Verbalized understanding;Returned demonstration             PT Long Term Goals - 01/22/17 1054      PT LONG TERM GOAL #1   Title Patient will be independent with HEP to continue benefits of therapy after discharge.   Baseline Requires moderate cueing with performance of exercise   Time 8   Period Weeks   Status New     PT LONG TERM GOAL #2   Title Patient will improve TUG to under 12 seconds to indicate signficant improvement in fall risk.    Baseline TUG: 24sec   Time 8   Period Weeks   Status New     PT LONG TERM GOAL #3   Title Patient will improve 60minWT to over 1066ft to indicate functional improvement in walking tolerance and greater ability to walk in stores  before requiring a sitting rest break   Baseline 477ft   Time 8   Period Weeks   Status New     PT LONG TERM GOAL #4   Title Patient will demonstrate ability to stand for over 2 min to allow for improved ability to perform household chores at home.    Baseline able to stand for 50sec before requiring a sitting rest break   Time 8   Period Weeks   Status New     PT LONG TERM GOAL #5   Title Pt will decrease 5TSTS to <12 seconds from a chair at 19in in height in order to demonstrate clinically significant improvement in LE strength   Baseline 5xSTS: 14 sec from 22" height chair   Time 8   Period Weeks   Status New               Plan - 03/12/17 1108    Clinical Impression Statement Patient demonstrates decreased endurance today versus previous visits secondary to patient's inactivity at home and not coming into therapy for 3 weeks. Patient demonstrates decreased stance time and total walk time, (patient was able to walk for 159ft prior and now can only walk for 46ft before requiring a sitting rest break). Patient demonstrates decreased strength and endurance and will benefit from further skilled therapy focused on improving limitations to return to prior level of function.    Rehab Potential Fair   Clinical Impairments Affecting Rehab Potential Positive: motivation; Negative: age, chronicity, bilateral knee pain   PT Frequency 2x / week   PT Duration 6 weeks   PT Treatment/Interventions ADLs/Self Care Home Management;Aquatic Therapy;Canalith Repostioning;Cryotherapy;Electrical Stimulation;Iontophoresis 4mg /ml Dexamethasone;Moist Heat;Traction;Ultrasound;DME Instruction;Gait training;Stair training;Functional mobility training;Therapeutic activities;Therapeutic exercise;Balance training;Neuromuscular re-education;Patient/family education;Wheelchair mobility training;Manual techniques;Passive range of motion;Vestibular   PT Next Visit Plan Progress strengthening, reinforce HEP   PT  Home Exercise Plan Cervical retraction   Consulted and Agree with Plan of Care Patient;Family member/caregiver   Family Member Consulted Daughter      Patient will benefit from skilled therapeutic intervention in order to improve the following deficits and impairments:  Abnormal gait, Decreased strength, Obesity, Decreased activity tolerance, Decreased endurance, Difficulty walking, Decreased balance, Decreased mobility, Decreased coordination, Increased muscle spasms, Postural dysfunction, Hypomobility, Decreased range of motion  Visit Diagnosis: Difficulty in walking, not elsewhere classified  Muscle weakness (generalized)  Unsteadiness on feet     Problem List Patient Active Problem List   Diagnosis Date Noted  . Localized edema 02/13/2017  . Hemorrhoids 05/17/2016  . Obesity (  BMI 30-39.9) 04/26/2016  . Multiple lung nodules 04/19/2016  . Chest pain 04/17/2016  . Partial symptomatic epilepsy with complex partial seizures, not intractable, without status epilepticus ([place]) 06/30/2015  . Bronchiectasis without complication ([place]) 37/29/0211  . Imbalance 11/08/2014  . Essential (primary) hypertension 11/08/2014  . Personal history of malignant neoplasm of breast 11/08/2014  . History of malignant carcinoid tumor of bronchus and lung 11/08/2014  . H/O peptic ulcer 11/08/2014  . Asthma, mild intermittent 11/08/2014  . PE (pulmonary embolism) 11/08/2014  . Gonalgia 11/08/2014  . Leg varices 11/08/2014    Blythe Stanford, PT DPT 03/12/2017, 11:11 AM  [facility] [address] [place], [address] Phone: [phone]   Fax:  [phone]  Name: Rebecca Wolf MRN: [id number] Date of Birth: 08-29-1924

## 2017-03-19 ENCOUNTER — Ambulatory Visit: Payer: Medicare Other

## 2017-03-19 DIAGNOSIS — M6281 Muscle weakness (generalized): Secondary | ICD-10-CM

## 2017-03-19 DIAGNOSIS — R2681 Unsteadiness on feet: Secondary | ICD-10-CM

## 2017-03-19 DIAGNOSIS — R262 Difficulty in walking, not elsewhere classified: Secondary | ICD-10-CM

## 2017-03-19 NOTE — Therapy (Signed)
[facility] [address] [place], [address] Phone: [phone]   Fax:  [phone]  Physical Therapy Treatment  Patient Details  Name: Rebecca Wolf MRN: [id number] Date of Birth: September 13, 1924 Referring Provider: Dr. Sharlet Salina  Encounter Date: 03/19/2017      PT End of Session - 03/19/17 1035    Visit Number 7   Number of Visits 16   Date for PT Re-Evaluation 03/19/17   Authorization Type 7 / 10 G Code   PT Start Time 1005   PT Stop Time 1036   PT Time Calculation (min) 31 min   Equipment Utilized During Treatment Gait belt   Activity Tolerance Patient tolerated treatment well   Behavior During Therapy WFL for tasks assessed/performed      Past Medical History:  Diagnosis Date  . Asthma   . Breast cancer ([place]) 2003   LT LUMPECTOMY  . Breast cancer ([place]) 2004   LT LUMPECTOMY  . 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  . GERD (gastroesophageal reflux disease)   . Hemorrhoids   . Hypertension   . Personal history of chemotherapy 2004   BREAST CA  . Personal history of radiation therapy 2003   BREAST CA  . Personal history of radiation therapy 2004   BREAST CA  . Polyp of colon 2002   bleeding polyp of right ascending colon  . PUD (peptic ulcer disease)   . Seizures ([place])    epilepsy well controlled    Past Surgical History:  Procedure Laterality Date  . BREAST EXCISIONAL BIOPSY Left 2003   positive  . BREAST EXCISIONAL BIOPSY Left 2004   positive  . BREAST LUMPECTOMY Left 2003   BREAST CA  . BREAST LUMPECTOMY Left 2004   BREAST CA  . HEMICOLECTOMY Right    bowel obstruction  . HEMORRHOID SURGERY    . THORACOTOMY/LOBECTOMY  1999   carcinoid tumor  . TOTAL VAGINAL HYSTERECTOMY    . VARICOSE VEIN SURGERY      There were no vitals filed for this visit.      Subjective Assessment - 03/19/17 1032    Subjective Patient reports no major changes since the previous visitation  session. Patient reports she performs exercises sporadically but not often.    Patient is accompained by: --  Daughter   Pertinent History Previously seen for balance disorders and for neck pain over the past year.    Limitations Walking;Sitting   How long can you walk comfortably? 3 min    Diagnostic tests None   Patient Stated Goals To be able to walk without an walker.    Currently in Pain? No/denies   Pain Onset 1 to 4 weeks ago         TREATMENT: Therapeutic Exercise Sit to stands - x 5 reps with use of UEs Nustep in sitting - x5 min with resistance at level 3 and cueing on improving speed of performance Ambulating around gym with [facility] and educating on proper UE/LE progression with movement. - 7ft, (129ft, 172ft performed with Rollator walker) Standing time for balance and endurance - 46 sec, 63sec   * Patient required frequent rest breaks throughout treatment session Patient demonstrates increased LE fatigue at end of session        PT Education - 03/19/17 1034    Education provided Yes   Education Details form/technique with exercise   Person(s) Educated Patient   Methods Explanation;Demonstration   Comprehension Verbalized  understanding;Returned demonstration             PT Long Term Goals - 01/22/17 1054      PT LONG TERM GOAL #1   Title Patient will be independent with HEP to continue benefits of therapy after discharge.   Baseline Requires moderate cueing with performance of exercise   Time 8   Period Weeks   Status New     PT LONG TERM GOAL #2   Title Patient will improve TUG to under 12 seconds to indicate signficant improvement in fall risk.    Baseline TUG: 24sec   Time 8   Period Weeks   Status New     PT LONG TERM GOAL #3   Title Patient will improve 28minWT to over 1033ft to indicate functional improvement in walking tolerance and greater ability to walk in stores before requiring a sitting rest break   Baseline 469ft   Time 8   Period  Weeks   Status New     PT LONG TERM GOAL #4   Title Patient will demonstrate ability to stand for over 2 min to allow for improved ability to perform household chores at home.    Baseline able to stand for 50sec before requiring a sitting rest break   Time 8   Period Weeks   Status New     PT LONG TERM GOAL #5   Title Pt will decrease 5TSTS to <12 seconds from a chair at 19in in height in order to demonstrate clinically significant improvement in LE strength   Baseline 5xSTS: 14 sec from 22" height chair   Time 8   Period Weeks   Status New               Plan - 03/19/17 1037    Clinical Impression Statement Patient demonstrates decreased standing time for balance and endurance, simliarly to the prior week. Patient all demonstrates demonstrates decreased ambulation ability to [facility] indicating decrease in endurance and LE functional strength. WIll assist further on the next visit at time of reassessment. Patient will benefit from further skilled therapy focused on improving LE strength and endurance to improve balance and decrease fall risk.    Rehab Potential Fair   Clinical Impairments Affecting Rehab Potential Positive: motivation; Negative: age, chronicity, bilateral knee pain   PT Frequency 2x / week   PT Duration 6 weeks   PT Treatment/Interventions ADLs/Self Care Home Management;Aquatic Therapy;Canalith Repostioning;Cryotherapy;Electrical Stimulation;Iontophoresis 4mg /ml Dexamethasone;Moist Heat;Traction;Ultrasound;DME Instruction;Gait training;Stair training;Functional mobility training;Therapeutic activities;Therapeutic exercise;Balance training;Neuromuscular re-education;Patient/family education;Wheelchair mobility training;Manual techniques;Passive range of motion;Vestibular   PT Next Visit Plan Progress strengthening, reinforce HEP   PT Home Exercise Plan Cervical retraction   Consulted and Agree with Plan of Care Patient;Family member/caregiver   Family Member Consulted  Daughter      Patient will benefit from skilled therapeutic intervention in order to improve the following deficits and impairments:  Abnormal gait, Decreased strength, Obesity, Decreased activity tolerance, Decreased endurance, Difficulty walking, Decreased balance, Decreased mobility, Decreased coordination, Increased muscle spasms, Postural dysfunction, Hypomobility, Decreased range of motion  Visit Diagnosis: Difficulty in walking, not elsewhere classified  Muscle weakness (generalized)  Unsteadiness on feet     Problem List Patient Active Problem List   Diagnosis Date Noted  . Localized edema 02/13/2017  . Hemorrhoids 05/17/2016  . Obesity (BMI 30-39.9) 04/26/2016  . Multiple lung nodules 04/19/2016  . Chest pain 04/17/2016  . Partial symptomatic epilepsy with complex partial seizures, not intractable, without status epilepticus ([place]) 06/30/2015  .  Bronchiectasis without complication ([place]) 67/59/1638  . Imbalance 11/08/2014  . Essential (primary) hypertension 11/08/2014  . Personal history of malignant neoplasm of breast 11/08/2014  . History of malignant carcinoid tumor of bronchus and lung 11/08/2014  . H/O peptic ulcer 11/08/2014  . Asthma, mild intermittent 11/08/2014  . PE (pulmonary embolism) 11/08/2014  . Gonalgia 11/08/2014  . Leg varices 11/08/2014    Blythe Stanford, PT DPT 03/19/2017, 11:30 AM  [facility] [address] [place], [address] Phone: [phone]   Fax:  [phone]  Name: Rebecca Wolf MRN: [id number] Date of Birth: 1924-10-25

## 2017-03-20 ENCOUNTER — Ambulatory Visit: Payer: Self-pay | Admitting: Internal Medicine

## 2017-03-26 ENCOUNTER — Ambulatory Visit: Payer: Medicare Other

## 2017-03-26 DIAGNOSIS — R262 Difficulty in walking, not elsewhere classified: Secondary | ICD-10-CM

## 2017-03-26 DIAGNOSIS — M6281 Muscle weakness (generalized): Secondary | ICD-10-CM

## 2017-03-26 DIAGNOSIS — R2681 Unsteadiness on feet: Secondary | ICD-10-CM

## 2017-03-26 NOTE — Therapy (Signed)
[facility] [address] [place], [address] Phone: [phone]   Fax:  [phone]  Physical Therapy Treatment  Patient Details  Name: Rebecca Wolf MRN: [id number] Date of Birth: 10-07-1924 Referring Provider: Dr. Sharlet Salina  Encounter Date: 03/26/2017      PT End of Session - 03/26/17 1012    Visit Number 8   Number of Visits 16   Date for PT Re-Evaluation 05/07/17   Authorization Type 8 / 10 G Code   PT Start Time 1005   PT Stop Time 1045   PT Time Calculation (min) 40 min   Equipment Utilized During Treatment Gait belt   Activity Tolerance Patient tolerated treatment well   Behavior During Therapy [facility] for tasks assessed/performed      Past Medical History:  Diagnosis Date  . Asthma   . Breast cancer ([place]) 2003   LT LUMPECTOMY  . Breast cancer ([place]) 2004   LT LUMPECTOMY  . 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  . GERD (gastroesophageal reflux disease)   . Hemorrhoids   . Hypertension   . Personal history of chemotherapy 2004   BREAST CA  . Personal history of radiation therapy 2003   BREAST CA  . Personal history of radiation therapy 2004   BREAST CA  . Polyp of colon 2002   bleeding polyp of right ascending colon  . PUD (peptic ulcer disease)   . Seizures ([place])    epilepsy well controlled    Past Surgical History:  Procedure Laterality Date  . BREAST EXCISIONAL BIOPSY Left 2003   positive  . BREAST EXCISIONAL BIOPSY Left 2004   positive  . BREAST LUMPECTOMY Left 2003   BREAST CA  . BREAST LUMPECTOMY Left 2004   BREAST CA  . HEMICOLECTOMY Right    bowel obstruction  . HEMORRHOID SURGERY    . THORACOTOMY/LOBECTOMY  1999   carcinoid tumor  . TOTAL VAGINAL HYSTERECTOMY    . VARICOSE VEIN SURGERY      There were no vitals filed for this visit.      Subjective Assessment - 03/26/17 1009    Subjective Patient reports no major changes since the previous sesssion  and states she is prepared for the goal check today.    Patient is accompained by: --  Daughter   Pertinent History Previously seen for balance disorders and for neck pain over the past year.    Limitations Walking;Sitting   How long can you walk comfortably? 3 min    Diagnostic tests None   Patient Stated Goals To be able to walk without an walker.    Currently in Pain? No/denies   Pain Onset 1 to 4 weeks ago          TREATMENT: Therapeutic Exercise Sit to stands - 3 x 5 reps with use of UEs Standing trials for LE endurance and balance - 3 x 60secs Standing and ambulating then performing a turnaround to back to sit - x3 for 64m each lap Ambulating around gym with rollator walker for improved LE endurance - 561ft with rollator requiring a sitting rest break ~ 150ft    * Patient required frequent rest breaks throughout treatment session Patient demonstrates increased LE fatigue at end of session        PT Education - 03/26/17 1011    Education provided Yes   Education Details form/technique with exercise; POC   Person(s) Educated Patient   Methods Explanation;Demonstration  Comprehension Verbalized understanding;Returned demonstration             PT Long Term Goals - 03/26/17 1014      PT LONG TERM GOAL #1   Title Patient will be independent with HEP to continue benefits of therapy after discharge.   Baseline Requires moderate cueing with performance of exercise   Time 8   Period Weeks   Status On-going     PT LONG TERM GOAL #2   Title Patient will improve TUG to under 12 seconds to indicate signficant improvement in fall risk.    Baseline TUG: 24sec; 03/26/2017: TUG: 22sec   Time 8   Period Weeks   Status On-going     PT LONG TERM GOAL #3   Title Patient will improve 3minWT to over 1075ft to indicate functional improvement in walking tolerance and greater ability to walk in stores before requiring a sitting rest break   Baseline 445ft; 03/26/2017: 5108ft    Time 8   Period Weeks   Status On-going     PT LONG TERM GOAL #4   Title Patient will demonstrate ability to stand for over 2 min to allow for improved ability to perform household chores at home.    Baseline able to stand for 50sec before requiring a sitting rest break; 03/26/2017: 60 sec before requiring a break   Time 8   Period Weeks   Status On-going     PT LONG TERM GOAL #5   Title Pt will decrease 5TSTS to <12 seconds from a chair at 19in in height in order to demonstrate clinically significant improvement in LE strength   Baseline 5xSTS: 14 sec from 22" height chair; 03/26/2017: 12.5sec   Time 8   Period Weeks   Status On-going               Plan - 03/26/17 1038    Clinical Impression Statement Patient demonstrates improvement with all long term functional goals such as the TUG, 5xSTS, 44minWT, and standing time without support indicating functional carryover, improved strength in standing, and decreased fall risk. Although patient is improving, she conitnues to demonstrate an increase in fall risk and poor endurance asindicated by decreased 54minWT and TUG times to perform. Patient also demonstrates increased fall risk with performing turns in standing. Patient will benefit from further skilled therapy to decrease fall risk.    Rehab Potential Fair   Clinical Impairments Affecting Rehab Potential Positive: motivation; Negative: age, chronicity, bilateral knee pain   PT Frequency 2x / week   PT Duration 6 weeks   PT Treatment/Interventions ADLs/Self Care Home Management;Aquatic Therapy;Canalith Repostioning;Cryotherapy;Electrical Stimulation;Iontophoresis 4mg /ml Dexamethasone;Moist Heat;Traction;Ultrasound;DME Instruction;Gait training;Stair training;Functional mobility training;Therapeutic activities;Therapeutic exercise;Balance training;Neuromuscular re-education;Patient/family education;Wheelchair mobility training;Manual techniques;Passive range of motion;Vestibular   PT  Next Visit Plan Progress strengthening, reinforce HEP   PT Home Exercise Plan Cervical retraction   Consulted and Agree with Plan of Care Patient;Family member/caregiver   Family Member Consulted Daughter      Patient will benefit from skilled therapeutic intervention in order to improve the following deficits and impairments:  Abnormal gait, Decreased strength, Obesity, Decreased activity tolerance, Decreased endurance, Difficulty walking, Decreased balance, Decreased mobility, Decreased coordination, Increased muscle spasms, Postural dysfunction, Hypomobility, Decreased range of motion  Visit Diagnosis: Difficulty in walking, not elsewhere classified - Plan: PT plan of care cert/re-cert  Muscle weakness (generalized) - Plan: PT plan of care cert/re-cert  Unsteadiness on feet - Plan: PT plan of care cert/re-cert  G-Codes - 03/26/17 1043    Functional Assessment Tool Used (Outpatient Only) Clinical judgement, MMT, TUG, 5XSTS, 47mwt, 97minwt   Functional Limitation Mobility: Walking and moving around   Mobility: Walking and Moving Around Current Status [phone]) At least 40 percent but less than 60 percent impaired, limited or restricted   Mobility: Walking and Moving Around Goal Status [phone]) At least 20 percent but less than 40 percent impaired, limited or restricted      Problem List Patient Active Problem List   Diagnosis Date Noted  . Localized edema 02/13/2017  . Hemorrhoids 05/17/2016  . Obesity (BMI 30-39.9) 04/26/2016  . Multiple lung nodules 04/19/2016  . Chest pain 04/17/2016  . Partial symptomatic epilepsy with complex partial seizures, not intractable, without status epilepticus ([place]) 06/30/2015  . Bronchiectasis without complication ([place]) 04/20/1172  . Imbalance 11/08/2014  . Essential (primary) hypertension 11/08/2014  . Personal history of malignant neoplasm of breast 11/08/2014  . History of malignant carcinoid tumor of bronchus and lung 11/08/2014  . H/O  peptic ulcer 11/08/2014  . Asthma, mild intermittent 11/08/2014  . PE (pulmonary embolism) 11/08/2014  . Gonalgia 11/08/2014  . Leg varices 11/08/2014    Blythe Stanford, PT DPT 03/26/2017, 10:45 AM  [facility] [address], [place], [address] Phone: [phone]   Fax:  [phone]  Name: Rebecca Wolf MRN: [id number] Date of Birth: Jan 21, 1925

## 2017-04-02 ENCOUNTER — Ambulatory Visit: Payer: Medicare Other | Attending: Physical Medicine and Rehabilitation

## 2017-04-02 DIAGNOSIS — R262 Difficulty in walking, not elsewhere classified: Secondary | ICD-10-CM

## 2017-04-02 DIAGNOSIS — R2681 Unsteadiness on feet: Secondary | ICD-10-CM | POA: Diagnosis not present

## 2017-04-02 DIAGNOSIS — M6281 Muscle weakness (generalized): Secondary | ICD-10-CM | POA: Diagnosis not present

## 2017-04-02 NOTE — Therapy (Signed)
[facility] [address] [place], [address] Phone: [phone]   Fax:  [phone]  Physical Therapy Treatment  Patient Details  Name: Rebecca Wolf MRN: [id number] Date of Birth: [birth year] Referring Provider: Dr. Sharlet Salina  Encounter Date: 04/02/2017      PT End of Session - 04/02/17 1040    Visit Number 9   Number of Visits 16   Date for PT Re-Evaluation 05/07/17   Authorization Type 9 / 10 G Code   PT Start Time 1007   PT Stop Time 1045   PT Time Calculation (min) 38 min   Equipment Utilized During Treatment Gait belt   Activity Tolerance Patient tolerated treatment well   Behavior During Therapy WFL for tasks assessed/performed      Past Medical History:  Diagnosis Date  . Asthma   . Breast cancer ([place]) 2003   LT LUMPECTOMY  . Breast cancer ([place]) 2004   LT LUMPECTOMY  . 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  . GERD (gastroesophageal reflux disease)   . Hemorrhoids   . Hypertension   . Personal history of chemotherapy 2004   BREAST CA  . Personal history of radiation therapy 2003   BREAST CA  . Personal history of radiation therapy 2004   BREAST CA  . Polyp of colon 2002   bleeding polyp of right ascending colon  . PUD (peptic ulcer disease)   . Seizures ([place])    epilepsy well controlled    Past Surgical History:  Procedure Laterality Date  . BREAST EXCISIONAL BIOPSY Left 2003   positive  . BREAST EXCISIONAL BIOPSY Left 2004   positive  . BREAST LUMPECTOMY Left 2003   BREAST CA  . BREAST LUMPECTOMY Left 2004   BREAST CA  . HEMICOLECTOMY Right    bowel obstruction  . HEMORRHOID SURGERY    . THORACOTOMY/LOBECTOMY  1999   carcinoid tumor  . TOTAL VAGINAL HYSTERECTOMY    . VARICOSE VEIN SURGERY      There were no vitals filed for this visit.      Subjective Assessment - 04/02/17 1014    Subjective Patient reports no major changes and states increased tiredness  after the previous session.    Patient is accompained by: --  Daughter   Pertinent History Previously seen for balance disorders and for neck pain over the past year.    Limitations Walking;Sitting   How long can you walk comfortably? 3 min    Diagnostic tests None   Patient Stated Goals To be able to walk without an walker.    Currently in Pain? No/denies   Pain Onset 1 to 4 weeks ago      TREATMENT: Therapeutic Exercise Nustep with focus on improving speed of movement and cadence - 66min level 4 Ambulating around gym with rollator walker for improved LE endurance - 520ft with rollator requiring a sitting rest break ~ 133ft  Marches on balance stones with UE support - x 25 Step overs 4 point cane with balance stone to raise lower side -  2x 10 Standing squats with UE support - x 20 with performing throughout limited AROM   * Patient required frequent rest breaks throughout treatment session Patient demonstrates increased LE fatigue at end of session       PT Education - 04/02/17 1028    Education provided Yes   Education Details form/technique with exercise   Person(s) Educated Patient   Methods  Explanation;Demonstration   Comprehension Verbalized understanding;Returned demonstration             PT Long Term Goals - 03/26/17 1014      PT LONG TERM GOAL #1   Title Patient will be independent with HEP to continue benefits of therapy after discharge.   Baseline Requires moderate cueing with performance of exercise   Time 8   Period Weeks   Status On-going     PT LONG TERM GOAL #2   Title Patient will improve TUG to under 12 seconds to indicate signficant improvement in fall risk.    Baseline TUG: 24sec; 03/26/2017: TUG: 22sec   Time 8   Period Weeks   Status On-going     PT LONG TERM GOAL #3   Title Patient will improve 83minWT to over 1069ft to indicate functional improvement in walking tolerance and greater ability to walk in stores before requiring a sitting  rest break   Baseline 44ft; 03/26/2017: 589ft   Time 8   Period Weeks   Status On-going     PT LONG TERM GOAL #4   Title Patient will demonstrate ability to stand for over 2 min to allow for improved ability to perform household chores at home.    Baseline able to stand for 50sec before requiring a sitting rest break; 03/26/2017: 60 sec before requiring a break   Time 8   Period Weeks   Status On-going     PT LONG TERM GOAL #5   Title Pt will decrease 5TSTS to <12 seconds from a chair at 19in in height in order to demonstrate clinically significant improvement in LE strength   Baseline 5xSTS: 14 sec from 22" height chair; 03/26/2017: 12.5sec   Time 8   Period Weeks   Status On-going               Plan - 04/02/17 1106    Clinical Impression Statement Continued to focus on improving ambulation quality and endurance today with therapy. Patient demonstrates decreased foot clearance bilaterally and performing stepping over cane to help improve. Patient's step quality demonstrates improvement after performance and patient will benefit from further skilled therapy to return to prior level of function.     Rehab Potential Fair   Clinical Impairments Affecting Rehab Potential Positive: motivation; Negative: age, chronicity, bilateral knee pain   PT Frequency 2x / week   PT Duration 6 weeks   PT Treatment/Interventions ADLs/Self Care Home Management;Aquatic Therapy;Canalith Repostioning;Cryotherapy;Electrical Stimulation;Iontophoresis 4mg /ml Dexamethasone;Moist Heat;Traction;Ultrasound;DME Instruction;Gait training;Stair training;Functional mobility training;Therapeutic activities;Therapeutic exercise;Balance training;Neuromuscular re-education;Patient/family education;Wheelchair mobility training;Manual techniques;Passive range of motion;Vestibular   PT Next Visit Plan Progress strengthening, reinforce HEP   PT Home Exercise Plan Cervical retraction   Consulted and Agree with Plan of Care  Patient;Family member/caregiver   Family Member Consulted Daughter      Patient will benefit from skilled therapeutic intervention in order to improve the following deficits and impairments:  Abnormal gait, Decreased strength, Obesity, Decreased activity tolerance, Decreased endurance, Difficulty walking, Decreased balance, Decreased mobility, Decreased coordination, Increased muscle spasms, Postural dysfunction, Hypomobility, Decreased range of motion  Visit Diagnosis: Difficulty in walking, not elsewhere classified  Muscle weakness (generalized)  Unsteadiness on feet     Problem List Patient Active Problem List   Diagnosis Date Noted  . Localized edema 02/13/2017  . Hemorrhoids 05/17/2016  . Obesity (BMI 30-39.9) 04/26/2016  . Multiple lung nodules 04/19/2016  . Chest pain 04/17/2016  . Partial symptomatic epilepsy with complex partial seizures, not intractable, without  status epilepticus ([place]) 06/30/2015  . Bronchiectasis without complication ([place]) 45/80/9983  . Imbalance 11/08/2014  . Essential (primary) hypertension 11/08/2014  . Personal history of malignant neoplasm of breast 11/08/2014  . History of malignant carcinoid tumor of bronchus and lung 11/08/2014  . H/O peptic ulcer 11/08/2014  . Asthma, mild intermittent 11/08/2014  . PE (pulmonary embolism) 11/08/2014  . Gonalgia 11/08/2014  . Leg varices 11/08/2014    Blythe Stanford, PT DPT 04/02/2017, 11:09 AM  [facility] [address] [place], [address] Phone: [phone]   Fax:  [phone]  Name: ERMA RAICHE MRN: [id number] Date of Birth: 1925/01/18

## 2017-04-04 ENCOUNTER — Ambulatory Visit: Payer: Self-pay | Admitting: Internal Medicine

## 2017-04-09 ENCOUNTER — Ambulatory Visit: Payer: Medicare Other

## 2017-04-09 DIAGNOSIS — R262 Difficulty in walking, not elsewhere classified: Secondary | ICD-10-CM | POA: Diagnosis not present

## 2017-04-09 DIAGNOSIS — M6281 Muscle weakness (generalized): Secondary | ICD-10-CM

## 2017-04-09 DIAGNOSIS — R2681 Unsteadiness on feet: Secondary | ICD-10-CM

## 2017-04-09 NOTE — Therapy (Signed)
[facility] [address] [place], [address] Phone: [phone]   Fax:  [phone]  Physical Therapy Treatment  Patient Details  Name: Rebecca Wolf MRN: [id number] Date of Birth: 1924-09-07 Referring Provider: Dr. Sharlet Salina  Encounter Date: 04/09/2017      PT End of Session - 04/09/17 1141    Visit Number 10   Number of Visits 16   Date for PT Re-Evaluation 05/07/17   Authorization Type 10 / 10 G Code   PT Start Time 4627   PT Stop Time 1152   PT Time Calculation (min) 29 min   Equipment Utilized During Treatment Gait belt   Activity Tolerance Patient tolerated treatment well   Behavior During Therapy WFL for tasks assessed/performed      Past Medical History:  Diagnosis Date  . Asthma   . Breast cancer ([place]) 2003   LT LUMPECTOMY  . Breast cancer ([place]) 2004   LT LUMPECTOMY  . 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  . GERD (gastroesophageal reflux disease)   . Hemorrhoids   . Hypertension   . Personal history of chemotherapy 2004   BREAST CA  . Personal history of radiation therapy 2003   BREAST CA  . Personal history of radiation therapy 2004   BREAST CA  . Polyp of colon 2002   bleeding polyp of right ascending colon  . PUD (peptic ulcer disease)   . Seizures ([place])    epilepsy well controlled    Past Surgical History:  Procedure Laterality Date  . BREAST EXCISIONAL BIOPSY Left 2003   positive  . BREAST EXCISIONAL BIOPSY Left 2004   positive  . BREAST LUMPECTOMY Left 2003   BREAST CA  . BREAST LUMPECTOMY Left 2004   BREAST CA  . HEMICOLECTOMY Right    bowel obstruction  . HEMORRHOID SURGERY    . THORACOTOMY/LOBECTOMY  1999   carcinoid tumor  . TOTAL VAGINAL HYSTERECTOMY    . VARICOSE VEIN SURGERY      There were no vitals filed for this visit.      Subjective Assessment - 04/09/17 1127    Subjective Patient reports no major changes, patient reports she can  walk without walker in the house and patient states shes able to perform stairs.    Patient is accompained by: --  Daughter   Pertinent History Previously seen for balance disorders and for neck pain over the past year.    Limitations Walking;Sitting   How long can you walk comfortably? 3 min    Diagnostic tests None   Patient Stated Goals To be able to walk without an walker.    Currently in Pain? No/denies   Pain Onset 1 to 4 weeks ago      TREATMENT: Therapeutic Exercise Nustep with focus on improving speed of movement and cadence - 38min level 4 Ambulating around gym with rollator walker for improved LE endurance - 530ft with rollator without requiring a sitting rest break  Step overs full foam roller B LEs -  x 10 Standing squats with UE support - x 20 with performing throughout limited AROM Heel raises in standing - x 20  Side stepping up and over on airex - x 5 B    * Patient required frequent rest breaks throughout treatment session Patient demonstrates increased LE fatigue at end of session       PT Education - 04/09/17 1140    Education provided Yes  Education Details form/technique with exercise   Person(s) Educated Patient   Methods Explanation;Demonstration   Comprehension Verbalized understanding;Returned demonstration             PT Long Term Goals - 03/26/17 1014      PT LONG TERM GOAL #1   Title Patient will be independent with HEP to continue benefits of therapy after discharge.   Baseline Requires moderate cueing with performance of exercise   Time 8   Period Weeks   Status On-going     PT LONG TERM GOAL #2   Title Patient will improve TUG to under 12 seconds to indicate signficant improvement in fall risk.    Baseline TUG: 24sec; 03/26/2017: TUG: 22sec   Time 8   Period Weeks   Status On-going     PT LONG TERM GOAL #3   Title Patient will improve 91minWT to over 10100ft to indicate functional improvement in walking tolerance and greater  ability to walk in stores before requiring a sitting rest break   Baseline 463ft; 03/26/2017: 563ft   Time 8   Period Weeks   Status On-going     PT LONG TERM GOAL #4   Title Patient will demonstrate ability to stand for over 2 min to allow for improved ability to perform household chores at home.    Baseline able to stand for 50sec before requiring a sitting rest break; 03/26/2017: 60 sec before requiring a break   Time 8   Period Weeks   Status On-going     PT LONG TERM GOAL #5   Title Pt will decrease 5TSTS to <12 seconds from a chair at 19in in height in order to demonstrate clinically significant improvement in LE strength   Baseline 5xSTS: 14 sec from 22" height chair; 03/26/2017: 12.5sec   Time 8   Period Weeks   Status On-going               Plan - May 06, 2017 1158    Clinical Impression Statement Patient demonstrates improvement with walking endurance today with ability to perform 543ft of walking before onset of fatigue. Patient demonstrates ability to perform greater amount of exercises before onset of fatigue. Patient continues to demonstrate increased LE weakness and endurance and will benefit from further skilled therpay to return to prior level of function.    Rehab Potential Fair   Clinical Impairments Affecting Rehab Potential Positive: motivation; Negative: age, chronicity, bilateral knee pain   PT Frequency 2x / week   PT Duration 6 weeks   PT Treatment/Interventions ADLs/Self Care Home Management;Aquatic Therapy;Canalith Repostioning;Cryotherapy;Electrical Stimulation;Iontophoresis 4mg /ml Dexamethasone;Moist Heat;Traction;Ultrasound;DME Instruction;Gait training;Stair training;Functional mobility training;Therapeutic activities;Therapeutic exercise;Balance training;Neuromuscular re-education;Patient/family education;Wheelchair mobility training;Manual techniques;Passive range of motion;Vestibular   PT Next Visit Plan Progress strengthening, reinforce HEP   PT Home  Exercise Plan Cervical retraction   Consulted and Agree with Plan of Care Patient;Family member/caregiver   Family Member Consulted Daughter      Patient will benefit from skilled therapeutic intervention in order to improve the following deficits and impairments:  Abnormal gait, Decreased strength, Obesity, Decreased activity tolerance, Decreased endurance, Difficulty walking, Decreased balance, Decreased mobility, Decreased coordination, Increased muscle spasms, Postural dysfunction, Hypomobility, Decreased range of motion  Visit Diagnosis: Difficulty in walking, not elsewhere classified  Muscle weakness (generalized)  Unsteadiness on feet       G-Codes - 2017-05-06 1201    Functional Assessment Tool Used (Outpatient Only) Clinical judgement, MMT, TUG, 5XSTS, 87mwt, 77minwt   Functional Limitation Mobility: Walking and moving around  Mobility: Walking and Moving Around Current Status [phone]) At least 40 percent but less than 60 percent impaired, limited or restricted   Mobility: Walking and Moving Around Goal Status [phone]) At least 20 percent but less than 40 percent impaired, limited or restricted      Problem List Patient Active Problem List   Diagnosis Date Noted  . Localized edema 02/13/2017  . Hemorrhoids 05/17/2016  . Obesity (BMI 30-39.9) 04/26/2016  . Multiple lung nodules 04/19/2016  . Chest pain 04/17/2016  . Partial symptomatic epilepsy with complex partial seizures, not intractable, without status epilepticus ([place]) 06/30/2015  . Bronchiectasis without complication ([place]) 65/53/7482  . Imbalance 11/08/2014  . Essential (primary) hypertension 11/08/2014  . Personal history of malignant neoplasm of breast 11/08/2014  . History of malignant carcinoid tumor of bronchus and lung 11/08/2014  . H/O peptic ulcer 11/08/2014  . Asthma, mild intermittent 11/08/2014  . PE (pulmonary embolism) 11/08/2014  . Gonalgia 11/08/2014  . Leg varices 11/08/2014    Blythe Stanford,  PT DPT 04/09/2017, 12:04 PM  [facility] [address], [place], [address] Phone: [phone]   Fax:  [phone]  Name: CARROLL RANNEY MRN: [id number] Date of Birth: September 30, 1924

## 2017-04-17 ENCOUNTER — Ambulatory Visit (INDEPENDENT_AMBULATORY_CARE_PROVIDER_SITE_OTHER): Payer: Medicare Other | Admitting: Internal Medicine

## 2017-04-17 ENCOUNTER — Encounter: Payer: Self-pay | Admitting: Internal Medicine

## 2017-04-17 VITALS — BP 136/84 | HR 70 | Ht 63.0 in | Wt 178.0 lb

## 2017-04-17 DIAGNOSIS — S8001XA Contusion of right knee, initial encounter: Secondary | ICD-10-CM | POA: Diagnosis not present

## 2017-04-17 DIAGNOSIS — W19XXXA Unspecified fall, initial encounter: Secondary | ICD-10-CM | POA: Diagnosis not present

## 2017-04-17 DIAGNOSIS — Z23 Encounter for immunization: Secondary | ICD-10-CM

## 2017-04-17 NOTE — Patient Instructions (Signed)
Elevate right leg, use tylenol if needed for discomfort, warm compresses to ribs and knee

## 2017-04-17 NOTE — Progress Notes (Signed)
Date:  04/17/2017   Name:  Rebecca Wolf   DOB:  1924/11/29   MRN:  [id number]   Chief Complaint: Knee Pain (Patient fell last night around 11PM. Went to bathroom without walker. Gilford Rile will not fit in doorway. Spot in floor is lose and she tripped while in slippers and fell on carpet. Right knee is bruised and full right leg is sore. ) and Chest Pain (Under right breast is sore. And LEFT Arm is sore. ) The fall was last night at home onto carpet. Her daughter found her as soon as she fell. Her son was able to come over and get her up and into bed. She denies any loss of consciousness headache or confusion. She did not strike her head. She believes she fell onto her outstretched right arm and then onto her right knee.   Review of Systems  Constitutional: Negative for fever.  Respiratory: Negative for chest tightness and shortness of breath.   Cardiovascular: Negative for chest pain and palpitations.  Gastrointestinal: Negative for abdominal pain.  Musculoskeletal: Arthralgias: right knee, leg and right ribs.  Neurological: Negative for dizziness, light-headedness and headaches.    Patient Active Problem List   Diagnosis Date Noted  . Localized edema 02/13/2017  . Hemorrhoids 05/17/2016  . Obesity (BMI 30-39.9) 04/26/2016  . Multiple lung nodules 04/19/2016  . Chest pain 04/17/2016  . Partial symptomatic epilepsy with complex partial seizures, not intractable, without status epilepticus ([place]) 06/30/2015  . Bronchiectasis without complication ([place]) 97/08/6376  . Imbalance 11/08/2014  . Essential (primary) hypertension 11/08/2014  . Personal history of malignant neoplasm of breast 11/08/2014  . History of malignant carcinoid tumor of bronchus and lung 11/08/2014  . H/O peptic ulcer 11/08/2014  . Asthma, mild intermittent 11/08/2014  . PE (pulmonary embolism) 11/08/2014  . Gonalgia 11/08/2014  . Leg varices 11/08/2014    Prior to Admission medications   Medication Sig Start Date  End Date Taking? Authorizing Provider  acetaminophen (TYLENOL) 500 MG tablet Take 500 mg by mouth every 6 (six) hours as needed.   Yes [provider]  albuterol (ACCUNEB) 1.25 MG/3ML nebulizer solution Inhale 1 ampule into the lungs 4 (four) times daily as needed. 05/06/14  Yes [provider]  albuterol (PROVENTIL HFA;VENTOLIN HFA) 108 (90 BASE) MCG/ACT inhaler Inhale 1 puff into the lungs 4 (four) times daily.   Yes [provider]  albuterol (PROVENTIL) (2.5 MG/3ML) 0.083% nebulizer solution USE 1 VIAL VIA NEBULIZER EVERY 6 HOURS AS NEEDED FOR WHEEZING 06/26/16  Yes Glean Hess, MD  amLODipine (NORVASC) 5 MG tablet TAKE 1 TABLET BY MOUTH EVERY DAY 10/27/16  Yes Glean Hess, MD  ELIQUIS 2.5 MG TABS tablet TAKE 1 TABLET BY MOUTH TWICE DAILY 09/25/15  Yes Glean Hess, MD  fluticasone [facility]) 50 MCG/ACT nasal spray Place 2 sprays into the nose daily.   Yes [provider]  Fluticasone-Salmeterol (ADVAIR) 500-50 MCG/DOSE AEPB Inhale 1 puff into the lungs 2 (two) times daily.   Yes [provider]  levETIRAcetam (KEPPRA) 750 MG tablet Take 1 tablet by mouth 2 (two) times daily. 06/29/13  Yes [provider]  montelukast (SINGULAIR) 10 MG tablet Take 1 tablet by mouth daily.   Yes [provider]  pantoprazole (PROTONIX) 40 MG tablet TAKE 1 TABLET BY MOUTH TWICE DAILY 12/12/16  Yes Glean Hess, MD  spironolactone-hydrochlorothiazide (ALDACTAZIDE) 25-25 MG tablet TAKE 1 TABLET BY MOUTH DAILY 01/12/17  Yes Glean Hess, MD  sucralfate (CARAFATE) 1 g tablet Take 1 tablet (1 g total) by mouth 4 (four) times daily -  with meals and at bedtime. 08/20/16  Yes Jonathon Bellows, MD    Allergies  Allergen Reactions  . Levofloxacin Shortness Of Breath  . Sulfa Antibiotics Rash    Past Surgical History:  Procedure Laterality Date  . BREAST EXCISIONAL BIOPSY Left 2003   positive  . BREAST EXCISIONAL BIOPSY Left 2004    positive  . BREAST LUMPECTOMY Left 2003   BREAST CA  . BREAST LUMPECTOMY Left 2004   BREAST CA  . HEMICOLECTOMY Right    bowel obstruction  . HEMORRHOID SURGERY    . THORACOTOMY/LOBECTOMY  1999   carcinoid tumor  . TOTAL VAGINAL HYSTERECTOMY    . VARICOSE VEIN SURGERY      Social History  Substance Use Topics  . Smoking status: Former Research scientist (life sciences)  . Smokeless tobacco: Never Used  . Alcohol use No     Medication list has been reviewed and updated.  PHQ 2/9 Scores 05/03/2016 01/16/2015  PHQ - 2 Score 0 0    Physical Exam  Constitutional: She is oriented to person, place, and time. She appears well-developed. No distress.  HENT:  Head: Normocephalic and atraumatic.  Neck: Normal range of motion. Neck supple. No thyromegaly present.  Cardiovascular: Normal rate, regular rhythm and normal heart sounds.   Pulmonary/Chest: Effort normal. No respiratory distress. She has no wheezes. She has no rales.  Musculoskeletal: She exhibits edema.       Right knee: She exhibits swelling and ecchymosis. She exhibits normal range of motion. Tenderness (over patella) found.  Tender over right anterior and lateral chest - no focal tenderness  Neurological: She is alert and oriented to person, place, and time.  Skin: Skin is warm and dry. No rash noted.  Psychiatric: She has a normal mood and affect. Her behavior is normal. Thought content normal.  Nursing note and vitals reviewed.   BP 136/84   Pulse 70   Ht 5\' 3"  (1.6 m)   Wt 178 lb (80.7 kg)   SpO2 97%   BMI 31.53 kg/m   Assessment and Plan: 1. Fall, initial encounter Precautions and recommendations discussed  2. Contusion of right knee, initial encounter Should resolve without complications Tylenol, elevation and heat as needed  3. Need for influenza vaccination - Flu Vaccine QUAD 36+ mos IM   No orders of the defined types were placed in this encounter.   Partially dictated using Editor, commissioning. Any errors are  unintentional.  Halina Maidens, MD [facility]  04/17/2017

## 2017-04-19 ENCOUNTER — Other Ambulatory Visit: Payer: Self-pay | Admitting: Internal Medicine

## 2017-04-21 ENCOUNTER — Telehealth: Payer: Self-pay

## 2017-04-21 NOTE — Telephone Encounter (Signed)
Patient daughter called stating patient is still complaining of being very sore from the fall. Taking tylenol as directed. Wants to know if anything else can be done? Please Advise.

## 2017-04-21 NOTE — Telephone Encounter (Signed)
She will be sore for several weeks until the bruises resolve.  There is nothing more than tylenol and warm compresses to be done at this time.

## 2017-04-22 NOTE — Telephone Encounter (Signed)
Patient daughter informed via VM

## 2017-04-29 DIAGNOSIS — E669 Obesity, unspecified: Secondary | ICD-10-CM | POA: Diagnosis not present

## 2017-04-29 DIAGNOSIS — J4520 Mild intermittent asthma, uncomplicated: Secondary | ICD-10-CM | POA: Diagnosis not present

## 2017-04-29 DIAGNOSIS — R918 Other nonspecific abnormal finding of lung field: Secondary | ICD-10-CM | POA: Diagnosis not present

## 2017-04-29 DIAGNOSIS — J479 Bronchiectasis, uncomplicated: Secondary | ICD-10-CM | POA: Diagnosis not present

## 2017-04-30 ENCOUNTER — Ambulatory Visit: Payer: Medicare Other

## 2017-04-30 DIAGNOSIS — R262 Difficulty in walking, not elsewhere classified: Secondary | ICD-10-CM | POA: Diagnosis not present

## 2017-04-30 DIAGNOSIS — R2681 Unsteadiness on feet: Secondary | ICD-10-CM | POA: Diagnosis not present

## 2017-04-30 DIAGNOSIS — M6281 Muscle weakness (generalized): Secondary | ICD-10-CM | POA: Diagnosis not present

## 2017-04-30 NOTE — Therapy (Signed)
[facility] [address] [place], [address] Phone: [phone]   Fax:  [phone]  Physical Therapy Treatment  Patient Details  Name: Rebecca Wolf MRN: [id number] Date of Birth: 19-Jan-1925 Referring Provider: Dr. Sharlet Salina  Encounter Date: 04/30/2017      PT End of Session - 04/30/17 1337    Visit Number 11   Number of Visits 16   Date for PT Re-Evaluation 05/07/17   Authorization Type 10 / 10 G Code   PT Start Time 0175   PT Stop Time 1345   PT Time Calculation (min) 40 min   Equipment Utilized During Treatment Gait belt   Activity Tolerance Patient tolerated treatment well   Behavior During Therapy [facility] for tasks assessed/performed      Past Medical History:  Diagnosis Date  . Asthma   . Breast cancer ([place]) 2003   LT LUMPECTOMY  . Breast cancer ([place]) 2004   LT LUMPECTOMY  . 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  . GERD (gastroesophageal reflux disease)   . Hemorrhoids   . Hypertension   . Personal history of chemotherapy 2004   BREAST CA  . Personal history of radiation therapy 2003   BREAST CA  . Personal history of radiation therapy 2004   BREAST CA  . Polyp of colon 2002   bleeding polyp of right ascending colon  . PUD (peptic ulcer disease)   . Seizures ([place])    epilepsy well controlled    Past Surgical History:  Procedure Laterality Date  . BREAST EXCISIONAL BIOPSY Left 2003   positive  . BREAST EXCISIONAL BIOPSY Left 2004   positive  . BREAST LUMPECTOMY Left 2003   BREAST CA  . BREAST LUMPECTOMY Left 2004   BREAST CA  . HEMICOLECTOMY Right    bowel obstruction  . HEMORRHOID SURGERY    . THORACOTOMY/LOBECTOMY  1999   carcinoid tumor  . TOTAL VAGINAL HYSTERECTOMY    . VARICOSE VEIN SURGERY      There were no vitals filed for this visit.      Subjective Assessment - 04/30/17 1331    Subjective Patient reports increased pain after a fall on her R side.  Patient reports she throught she had a broken rib; but the physician ruled it out based on symptoms   Patient is accompained by: --  Daughter   Pertinent History Previously seen for balance disorders and for neck pain over the past year.    Limitations Walking;Sitting   How long can you walk comfortably? 3 min    Diagnostic tests None   Patient Stated Goals To be able to walk without an walker.    Currently in Pain? No/denies   Pain Onset 1 to 4 weeks ago      TREATMENT: Therapeutic Exercise Nustep with focus on improving speed of movement and cadence - 3min level 3 Resisted Plantarflexion with GTB - x 12  Hip abduction with GTB - x30 Hip adduction with ball - x20 Seated marches with GTB - x 20    * Patient required frequent rest breaks throughout treatment session Patient demonstrates increased LE fatigue at end of session       PT Education - 04/30/17 1337    Education provided Yes   Education Details form/technique with exercise    Person(s) Educated Patient   Methods Explanation;Demonstration   Comprehension Verbalized understanding;Returned demonstration  PT Long Term Goals - 03/26/17 1014      PT LONG TERM GOAL #1   Title Patient will be independent with HEP to continue benefits of therapy after discharge.   Baseline Requires moderate cueing with performance of exercise   Time 8   Period Weeks   Status On-going     PT LONG TERM GOAL #2   Title Patient will improve TUG to under 12 seconds to indicate signficant improvement in fall risk.    Baseline TUG: 24sec; 03/26/2017: TUG: 22sec   Time 8   Period Weeks   Status On-going     PT LONG TERM GOAL #3   Title Patient will improve 9minWT to over 104ft to indicate functional improvement in walking tolerance and greater ability to walk in stores before requiring a sitting rest break   Baseline 467ft; 03/26/2017: 559ft   Time 8   Period Weeks   Status On-going     PT LONG TERM GOAL #4   Title  Patient will demonstrate ability to stand for over 2 min to allow for improved ability to perform household chores at home.    Baseline able to stand for 50sec before requiring a sitting rest break; 03/26/2017: 60 sec before requiring a break   Time 8   Period Weeks   Status On-going     PT LONG TERM GOAL #5   Title Pt will decrease 5TSTS to <12 seconds from a chair at 19in in height in order to demonstrate clinically significant improvement in LE strength   Baseline 5xSTS: 14 sec from 22" height chair; 03/26/2017: 12.5sec   Time 8   Period Weeks   Status On-going               Plan - 04/30/17 1346    Clinical Impression Statement Patient demonstrates increased fatigue and soreness from the fall she had two weeks ago. Patient reports she wanted to perform seated exercises secondary to soreness. Patient demonstrates increased soreness with hip adduction but experiences no increased pain with any other movement. Will continue to monitor pain and patient will benefit from further skilled therapy to return to prior level of function..   Rehab Potential Fair   Clinical Impairments Affecting Rehab Potential Positive: motivation; Negative: age, chronicity, bilateral knee pain   PT Frequency 2x / week   PT Duration 6 weeks   PT Treatment/Interventions ADLs/Self Care Home Management;Aquatic Therapy;Canalith Repostioning;Cryotherapy;Electrical Stimulation;Iontophoresis 4mg /ml Dexamethasone;Moist Heat;Traction;Ultrasound;DME Instruction;Gait training;Stair training;Functional mobility training;Therapeutic activities;Therapeutic exercise;Balance training;Neuromuscular re-education;Patient/family education;Wheelchair mobility training;Manual techniques;Passive range of motion;Vestibular   PT Next Visit Plan Progress strengthening, reinforce HEP   PT Home Exercise Plan Cervical retraction   Consulted and Agree with Plan of Care Patient;Family member/caregiver   Family Member Consulted Daughter       Patient will benefit from skilled therapeutic intervention in order to improve the following deficits and impairments:  Abnormal gait, Decreased strength, Obesity, Decreased activity tolerance, Decreased endurance, Difficulty walking, Decreased balance, Decreased mobility, Decreased coordination, Increased muscle spasms, Postural dysfunction, Hypomobility, Decreased range of motion  Visit Diagnosis: Difficulty in walking, not elsewhere classified  Muscle weakness (generalized)  Unsteadiness on feet     Problem List Patient Active Problem List   Diagnosis Date Noted  . Localized edema 02/13/2017  . Hemorrhoids 05/17/2016  . Obesity (BMI 30-39.9) 04/26/2016  . Multiple lung nodules 04/19/2016  . Chest pain 04/17/2016  . Partial symptomatic epilepsy with complex partial seizures, not intractable, without status epilepticus ([place]) 06/30/2015  . Bronchiectasis without  complication ([place]) 11/55/2080  . Imbalance 11/08/2014  . Essential (primary) hypertension 11/08/2014  . Personal history of malignant neoplasm of breast 11/08/2014  . History of malignant carcinoid tumor of bronchus and lung 11/08/2014  . H/O peptic ulcer 11/08/2014  . Asthma, mild intermittent 11/08/2014  . PE (pulmonary embolism) 11/08/2014  . Gonalgia 11/08/2014  . Leg varices 11/08/2014    Blythe Stanford, PT DPT  04/30/2017, 1:49 PM  [facility] [address], [place], [address] Phone: [phone]   Fax:  [phone]  Name: Rebecca Wolf MRN: [id number] Date of Birth: [birth year]

## 2017-05-06 ENCOUNTER — Ambulatory Visit: Payer: Medicare Other | Attending: Physical Medicine and Rehabilitation

## 2017-05-06 DIAGNOSIS — R262 Difficulty in walking, not elsewhere classified: Secondary | ICD-10-CM | POA: Insufficient documentation

## 2017-05-06 DIAGNOSIS — M6281 Muscle weakness (generalized): Secondary | ICD-10-CM | POA: Diagnosis not present

## 2017-05-06 DIAGNOSIS — R2681 Unsteadiness on feet: Secondary | ICD-10-CM | POA: Insufficient documentation

## 2017-05-06 NOTE — Therapy (Signed)
[facility] [address], [place], [address] Phone: [phone]   Fax:  [phone]  Physical Therapy Treatment  Patient Details  Name: Rebecca Wolf MRN: [id number] Date of Birth: 24-Aug-1924 Referring Provider: Dr. Sharlet Salina   Encounter Date: 05/06/2017  PT End of Session - 05/06/17 1000    Visit Number  12    Number of Visits  16    Date for PT Re-Evaluation  05/07/17    Authorization Type  2 / 10 G Code    PT Start Time  0951    PT Stop Time  1030    PT Time Calculation (min)  39 min    Equipment Utilized During Treatment  Gait belt    Activity Tolerance  Patient tolerated treatment well    Behavior During Therapy  [facility] for tasks assessed/performed       Past Medical History:  Diagnosis Date  . Asthma   . Breast cancer ([place]) 2003   LT LUMPECTOMY  . Breast cancer ([place]) 2004   LT LUMPECTOMY  . 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  . GERD (gastroesophageal reflux disease)   . Hemorrhoids   . Hypertension   . Personal history of chemotherapy 2004   BREAST CA  . Personal history of radiation therapy 2003   BREAST CA  . Personal history of radiation therapy 2004   BREAST CA  . Polyp of colon 2002   bleeding polyp of right ascending colon  . PUD (peptic ulcer disease)   . Seizures ([place])    epilepsy well controlled    Past Surgical History:  Procedure Laterality Date  . BREAST EXCISIONAL BIOPSY Left 2003   positive  . BREAST EXCISIONAL BIOPSY Left 2004   positive  . BREAST LUMPECTOMY Left 2003   BREAST CA  . BREAST LUMPECTOMY Left 2004   BREAST CA  . HEMICOLECTOMY Right    bowel obstruction  . HEMORRHOID SURGERY    . THORACOTOMY/LOBECTOMY  1999   carcinoid tumor  . TOTAL VAGINAL HYSTERECTOMY    . VARICOSE VEIN SURGERY      There were no vitals filed for this visit.  Subjective Assessment - 05/06/17 0955    Subjective  Patient reports her R side continues to be sore and  reports her daughter is going to take her to have an x ray. Patient reports no major differences otherwise.     Patient is accompained by:  -- Daughter   Daughter   Pertinent History  Previously seen for balance disorders and for neck pain over the past year.     Limitations  Walking;Sitting    How long can you walk comfortably?  3 min     Diagnostic tests  None    Patient Stated Goals  To be able to walk without an walker.     Currently in Pain?  No/denies    Pain Onset  1 to 4 weeks ago         TREATMENT: Therapeutic Exercise Nustep with focus on improving speed of movement and cadence - 60min level 3 Ambulation gait with focus on improving stride length - 54ft, 167ft Side stepping in standing down and back - 52ft x 5  Standing for time without UE support - 25 sec x 3    * Patient required frequent rest breaks throughout treatment session Patient demonstrates increased LE fatigue at end of session      PT Education -  05/06/17 0959    Education provided  Yes    Education Details  form/technique with exercise    Person(s) Educated  Patient    Methods  Explanation;Demonstration    Comprehension  Verbalized understanding;Returned demonstration          PT Long Term Goals - 03/26/17 1014      PT LONG TERM GOAL #1   Title  Patient will be independent with HEP to continue benefits of therapy after discharge.    Baseline  Requires moderate cueing with performance of exercise    Time  8    Period  Weeks    Status  On-going      PT LONG TERM GOAL #2   Title  Patient will improve TUG to under 12 seconds to indicate signficant improvement in fall risk.     Baseline  TUG: 24sec; 03/26/2017: TUG: 22sec    Time  8    Period  Weeks    Status  On-going      PT LONG TERM GOAL #3   Title  Patient will improve 53minWT to over 1035ft to indicate functional improvement in walking tolerance and greater ability to walk in stores before requiring a sitting rest break    Baseline   439ft; 03/26/2017: 587ft    Time  8    Period  Weeks    Status  On-going      PT LONG TERM GOAL #4   Title  Patient will demonstrate ability to stand for over 2 min to allow for improved ability to perform household chores at home.     Baseline  able to stand for 50sec before requiring a sitting rest break; 03/26/2017: 60 sec before requiring a break    Time  8    Period  Weeks    Status  On-going      PT LONG TERM GOAL #5   Title  Pt will decrease 5TSTS to <12 seconds from a chair at 19in in height in order to demonstrate clinically significant improvement in LE strength    Baseline  5xSTS: 14 sec from 22" height chair; 03/26/2017: 12.5sec    Time  8    Period  Weeks    Status  On-going            Plan - 05/06/17 1005    Clinical Impression Statement  Patient demonstrates improvement with walking ability today with greater stability with performance and improved step length. However, patient demonstrates increased in LE fatigue after performing 3 laps of walking indicating decreased muscular endurance. Patient will benefit from further skilled therapy return to prior level of function.     Rehab Potential  Fair    Clinical Impairments Affecting Rehab Potential  Positive: motivation; Negative: age, chronicity, bilateral knee pain    PT Frequency  2x / week    PT Duration  6 weeks    PT Treatment/Interventions  ADLs/Self Care Home Management;Aquatic Therapy;Canalith Repostioning;Cryotherapy;Electrical Stimulation;Iontophoresis 4mg /ml Dexamethasone;Moist Heat;Traction;Ultrasound;DME Instruction;Gait training;Stair training;Functional mobility training;Therapeutic activities;Therapeutic exercise;Balance training;Neuromuscular re-education;Patient/family education;Wheelchair mobility training;Manual techniques;Passive range of motion;Vestibular    PT Next Visit Plan  Progress strengthening, reinforce HEP    PT Home Exercise Plan  Cervical retraction    Consulted and Agree with Plan of  Care  Patient;Family member/caregiver    Family Member Consulted  Daughter       Patient will benefit from skilled therapeutic intervention in order to improve the following deficits and impairments:  Abnormal gait, Decreased strength, Obesity, Decreased  activity tolerance, Decreased endurance, Difficulty walking, Decreased balance, Decreased mobility, Decreased coordination, Increased muscle spasms, Postural dysfunction, Hypomobility, Decreased range of motion  Visit Diagnosis: Difficulty in walking, not elsewhere classified  Muscle weakness (generalized)  Unsteadiness on feet     Problem List Patient Active Problem List   Diagnosis Date Noted  . Localized edema 02/13/2017  . Hemorrhoids 05/17/2016  . Obesity (BMI 30-39.9) 04/26/2016  . Multiple lung nodules 04/19/2016  . Chest pain 04/17/2016  . Partial symptomatic epilepsy with complex partial seizures, not intractable, without status epilepticus ([place]) 06/30/2015  . Bronchiectasis without complication ([place]) 82/80/0349  . Imbalance 11/08/2014  . Essential (primary) hypertension 11/08/2014  . Personal history of malignant neoplasm of breast 11/08/2014  . History of malignant carcinoid tumor of bronchus and lung 11/08/2014  . H/O peptic ulcer 11/08/2014  . Asthma, mild intermittent 11/08/2014  . PE (pulmonary embolism) 11/08/2014  . Gonalgia 11/08/2014  . Leg varices 11/08/2014    Blythe Stanford, PT DPT 05/06/2017, 10:19 AM  [facility] [address] [place], [address] Phone: [phone]   Fax:  [phone]  Name: Rebecca Wolf MRN: [id number] Date of Birth: [birth year]

## 2017-05-12 ENCOUNTER — Other Ambulatory Visit: Payer: Self-pay

## 2017-05-12 ENCOUNTER — Ambulatory Visit: Payer: Medicare Other

## 2017-05-12 VITALS — BP 140/71 | HR 85

## 2017-05-12 DIAGNOSIS — R262 Difficulty in walking, not elsewhere classified: Secondary | ICD-10-CM | POA: Diagnosis not present

## 2017-05-12 DIAGNOSIS — M6281 Muscle weakness (generalized): Secondary | ICD-10-CM

## 2017-05-12 DIAGNOSIS — R2681 Unsteadiness on feet: Secondary | ICD-10-CM

## 2017-05-12 NOTE — Therapy (Signed)
[facility] [address] [place], [address] Phone: [phone]   Fax:  [phone]  Physical Therapy Treatment  Patient Details  Name: Rebecca Wolf MRN: [id number] Date of Birth: 05-06-1925 Referring Provider: Dr. Sharlet Salina   Encounter Date: 05/12/2017  PT End of Session - 05/12/17 1344    Visit Number  13    Number of Visits  32    Date for PT Re-Evaluation  07/07/17    Authorization Type  3 / 10 G Code, next visit will be 1/10    PT Start Time  1340    PT Stop Time  1425    PT Time Calculation (min)  45 min    Equipment Utilized During Treatment  Gait belt    Activity Tolerance  Patient tolerated treatment well    Behavior During Therapy  [facility] for tasks assessed/performed       Past Medical History:  Diagnosis Date  . Asthma   . Breast cancer ([place]) 2003   LT LUMPECTOMY  . Breast cancer ([place]) 2004   LT LUMPECTOMY  . 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  . GERD (gastroesophageal reflux disease)   . Hemorrhoids   . Hypertension   . Personal history of chemotherapy 2004   BREAST CA  . Personal history of radiation therapy 2003   BREAST CA  . Personal history of radiation therapy 2004   BREAST CA  . Polyp of colon 2002   bleeding polyp of right ascending colon  . PUD (peptic ulcer disease)   . Seizures ([place])    epilepsy well controlled    Past Surgical History:  Procedure Laterality Date  . BREAST EXCISIONAL BIOPSY Left 2003   positive  . BREAST EXCISIONAL BIOPSY Left 2004   positive  . BREAST LUMPECTOMY Left 2003   BREAST CA  . BREAST LUMPECTOMY Left 2004   BREAST CA  . HEMICOLECTOMY Right    bowel obstruction  . HEMORRHOID SURGERY    . THORACOTOMY/LOBECTOMY  1999   carcinoid tumor  . TOTAL VAGINAL HYSTERECTOMY    . VARICOSE VEIN SURGERY      Vitals:   05/12/17 1341  BP: 140/71  Pulse: 85  SpO2: 97%    Subjective Assessment - 05/12/17 1341    Subjective  Pt  reports she is doing well on this date. She continues to report 1-2/10 soreness on her R ribs from her fall. No specific questions or concerns at this time. Pt reports that she is "not really" performing her HEP.  When asked about why she isn't performing her HEP she is unable to give an answer.     Patient is accompained by:  -- Daughter    Pertinent History  Previously seen for balance disorders and for neck pain over the past year.     Limitations  Walking;Sitting    How long can you walk comfortably?  3 min     Diagnostic tests  None    Patient Stated Goals  To be able to walk without an walker.     Currently in Pain?  Yes    Pain Score  2     Pain Location  Rib cage    Pain Orientation  Right    Pain Descriptors / Indicators  Aching    Pain Type  Chronic pain    Pain Onset  1 to 4 weeks ago    Pain Frequency  Constant  [facility] PT Assessment - 05/12/17 1350      6 Minute Walk- Baseline   6 Minute Walk- Baseline  yes    BP (mmHg)  140/71    HR (bpm)  88    02 Sat (%RA)  96 %      6 Minute walk- Post Test   6 Minute Walk Post Test  yes    BP (mmHg)  136/68    HR (bpm)  90    02 Sat (%RA)  97 %    Modified Borg Scale for Dyspnea  5- Strong or hard breathing    Perceived Rate of Exertion (Borg)  10-      6 minute walk test results    Aerobic Endurance Distance Walked  480    Endurance additional comments  Pt reports being primarily limited by feeling "short winded."      Standardized Balance Assessment   Five times sit to stand comments   11.2    10 Meter Walk  10.95s = 0.91 m/s      Timed Up and Go Test   TUG  Normal TUG    Normal TUG (seconds)  21.7         TREATMENT:   Therapeutic Exercise  Completed outcome measures with patient including TUG, 6MWT, 78m gait speed and 5TSTS; Goals updated with patient; Standing exercises at bar with 2# ankle weights, standing marches, hip abduction, hip extension, and heel raises x 15 each bilateral;  * Patient  required frequent rest breaks throughout treatment session, fatigued by outcome measures;                      PT Education - 05/12/17 1343    Education provided  Yes    Education Details  outcome measures, exercise form/technique, plan of care    Person(s) Educated  Patient    Methods  Explanation    Comprehension  Verbalized understanding          PT Long Term Goals - 05/12/17 1345      PT LONG TERM GOAL #1   Title  Patient will be independent with HEP to continue benefits of therapy after discharge.    Baseline  Requires moderate cueing with performance of exercise    Time  8    Period  Weeks    Status  On-going    Target Date  07/07/17      PT LONG TERM GOAL #2   Title  Patient will improve TUG to under 12 seconds to indicate signficant improvement in fall risk.     Baseline  TUG: 24sec; 03/26/2017: TUG: 22sec; 05/12/17: 21.7 sec    Time  8    Period  Weeks    Status  On-going    Target Date  07/07/17      PT LONG TERM GOAL #3   Title  Patient will improve 30minWT to over 1088ft to indicate functional improvement in walking tolerance and greater ability to walk in stores before requiring a sitting rest break    Baseline  470ft; 03/26/2017: 532ft; 05/12/17: 458ft (primary deficit is DOE)    Time  8    Period  Weeks    Status  On-going    Target Date  07/07/17      PT LONG TERM GOAL #4   Title  Patient will demonstrate ability to stand for over 2 min to allow for improved ability to perform household chores at home.  Baseline  able to stand for 50sec before requiring a sitting rest break; 03/26/2017: 60 sec before requiring a break; 05/12/17: 60 seconds per pt subjective report    Time  8    Period  Weeks    Status  On-going    Target Date  07/07/17      PT LONG TERM GOAL #5   Title  Pt will decrease 5TSTS to <12 seconds from a chair at 19in in height in order to demonstrate clinically significant improvement in LE strength    Baseline  5xSTS: 14  sec from 22" height chair; 03/26/2017: 12.5sec; 05/12/17: 13.4 sec    Time  8    Period  Weeks    Status  On-going    Target Date  07/07/17            Plan - 05/12/17 2050    Clinical Impression Statement  Outcome measures today are unchanged from the last time they were assessed however they have also not delinced. At this point patient's goals for therapy are for maintenance purposes and to prevent functional decline. She would continue to benefit from further skilled therapy to maintain her current function or hopefully see some measureable improvement.     Rehab Potential  Fair    Clinical Impairments Affecting Rehab Potential  Positive: motivation; Negative: age, chronicity, bilateral knee pain    PT Frequency  2x / week    PT Duration  8 weeks    PT Treatment/Interventions  ADLs/Self Care Home Management;Aquatic Therapy;Canalith Repostioning;Cryotherapy;Electrical Stimulation;Iontophoresis 4mg /ml Dexamethasone;Moist Heat;Traction;Ultrasound;DME Instruction;Gait training;Stair training;Functional mobility training;Therapeutic activities;Therapeutic exercise;Balance training;Neuromuscular re-education;Patient/family education;Wheelchair mobility training;Manual techniques;Passive range of motion;Vestibular    PT Next Visit Plan  Progress strengthening, reinforce HEP    PT Home Exercise Plan  Cervical retraction    Consulted and Agree with Plan of Care  Patient;Family member/caregiver    Family Member Consulted  Daughter       Patient will benefit from skilled therapeutic intervention in order to improve the following deficits and impairments:  Abnormal gait, Decreased strength, Obesity, Decreased activity tolerance, Decreased endurance, Difficulty walking, Decreased balance, Decreased mobility, Decreased coordination, Increased muscle spasms, Postural dysfunction, Hypomobility, Decreased range of motion  Visit Diagnosis: Muscle weakness (generalized) - Plan: PT plan of care  cert/re-cert  Unsteadiness on feet - Plan: PT plan of care cert/re-cert  Difficulty in walking, not elsewhere classified - Plan: PT plan of care cert/re-cert   G-Codes - 78/29/56 10/09/2055    Functional Assessment Tool Used (Outpatient Only)  Clinical judgement, MMT, TUG, 5XSTS, 48mwt, 6MWT    Functional Limitation  Mobility: Walking and moving around    Mobility: Walking and Moving Around Current Status (O1308)  At least 40 percent but less than 60 percent impaired, limited or restricted    Mobility: Walking and Moving Around Goal Status [phone])  At least 20 percent but less than 40 percent impaired, limited or restricted       Problem List Patient Active Problem List   Diagnosis Date Noted  . Localized edema 02/13/2017  . Hemorrhoids 05/17/2016  . Obesity (BMI 30-39.9) 04/26/2016  . Multiple lung nodules 04/19/2016  . Chest pain 04/17/2016  . Partial symptomatic epilepsy with complex partial seizures, not intractable, without status epilepticus ([place]) 06/30/2015  . Bronchiectasis without complication ([place]) 69/62/9528  . Imbalance 11/08/2014  . Essential (primary) hypertension 11/08/2014  . Personal history of malignant neoplasm of breast 11/08/2014  . History of malignant carcinoid tumor of bronchus and lung 11/08/2014  .  H/O peptic ulcer 11/08/2014  . Asthma, mild intermittent 11/08/2014  . PE (pulmonary embolism) 11/08/2014  . Gonalgia 11/08/2014  . Leg varices 11/08/2014   Phillips Grout PT, DPT   Chanie Soucek 05/12/2017, 9:02 PM  [facility] [address], [place], [address] Phone: [phone]   Fax:  [phone]  Name: Rebecca Wolf MRN: [id number] Date of Birth: 30-Aug-1924

## 2017-05-20 ENCOUNTER — Ambulatory Visit: Payer: Medicare Other

## 2017-05-20 DIAGNOSIS — R262 Difficulty in walking, not elsewhere classified: Secondary | ICD-10-CM

## 2017-05-20 DIAGNOSIS — R2681 Unsteadiness on feet: Secondary | ICD-10-CM

## 2017-05-20 DIAGNOSIS — M6281 Muscle weakness (generalized): Secondary | ICD-10-CM | POA: Diagnosis not present

## 2017-05-20 NOTE — Therapy (Signed)
[facility] [address] [place], [address] Phone: [phone]   Fax:  [phone]  Physical Therapy Treatment  Patient Details  Name: Rebecca Wolf MRN: [id number] Date of Birth: 1925-01-26 Referring Provider: Dr. Sharlet Salina   Encounter Date: 05/20/2017  PT End of Session - 05/20/17 1404    Visit Number  14    Number of Visits  32    Date for PT Re-Evaluation  07/07/17    Authorization Type  G Code 1/10    PT Start Time  1347    PT Stop Time  1430    PT Time Calculation (min)  43 min    Equipment Utilized During Treatment  Gait belt    Activity Tolerance  Patient tolerated treatment well    Behavior During Therapy  [facility] for tasks assessed/performed       Past Medical History:  Diagnosis Date  . Asthma   . Breast cancer ([place]) 2003   LT LUMPECTOMY  . Breast cancer ([place]) 2004   LT LUMPECTOMY  . 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  . GERD (gastroesophageal reflux disease)   . Hemorrhoids   . Hypertension   . Personal history of chemotherapy 2004   BREAST CA  . Personal history of radiation therapy 2003   BREAST CA  . Personal history of radiation therapy 2004   BREAST CA  . Polyp of colon 2002   bleeding polyp of right ascending colon  . PUD (peptic ulcer disease)   . Seizures ([place])    epilepsy well controlled    Past Surgical History:  Procedure Laterality Date  . BREAST EXCISIONAL BIOPSY Left 2003   positive  . BREAST EXCISIONAL BIOPSY Left 2004   positive  . BREAST LUMPECTOMY Left 2003   BREAST CA  . BREAST LUMPECTOMY Left 2004   BREAST CA  . HEMICOLECTOMY Right    bowel obstruction  . HEMORRHOID SURGERY    . THORACOTOMY/LOBECTOMY  1999   carcinoid tumor  . TOTAL VAGINAL HYSTERECTOMY    . VARICOSE VEIN SURGERY      There were no vitals filed for this visit.  Subjective Assessment - 05/20/17 1356    Subjective  Patient reports she feels her knees are feeling "the  weakness they've ever been". Patient reports her L knee has been hurting her more when walking recently and states she has scheduled an appointment with her orthopedic doctor about the increased weakness/ knee pain.     Patient is accompained by:  -- Daughter    Pertinent History  Previously seen for balance disorders and for neck pain over the past year.     Limitations  Walking;Sitting    How long can you walk comfortably?  3 min     Diagnostic tests  None    Patient Stated Goals  To be able to walk without an walker.     Currently in Pain?  No/denies    Pain Onset  1 to 4 weeks ago        TREATMENT  Therapeutic Exercise: Standing Side stepping - with YTB around knees - x 15 B Squats in standing with YTB around knees - 2 x 10  Seated knee flexion/extension with use of 7# ball - x15 with 5# weights around ankle Nustep level 3 for 2 min to improve LE strength; level 2 for 4 min  Standing hip abduction with YTB around knees - x 10 B  Standing hip extension with YTB around knees - x 10 B  Heel raises in standing with UE support - x 15 B Patient reports her legs feel stronger after therapy session    TREATMENT Therapeutic Exercise: Walking Lunges -  x 10 B Single leg RDLS with 59# weight - x 20  Squats with holding 10# -- x 20  Standing hip abduction with RTB - x 12 B Standing hip circles - x 10 with RTB Step ups with high knee onto 12 "step - x20 Hip extension in RTB - x 20 B Patient demonstrates increased fatigue at end of session.    PT Education - 05/20/17 1403    Education provided  Yes    Education Details  exercise form/technique    Person(s) Educated  Patient    Methods  Explanation;Demonstration    Comprehension  Returned demonstration;Verbalized understanding          PT Long Term Goals - 05/12/17 1345      PT LONG TERM GOAL #1   Title  Patient will be independent with HEP to continue benefits of therapy after discharge.    Baseline  Requires moderate  cueing with performance of exercise    Time  8    Period  Weeks    Status  On-going    Target Date  07/07/17      PT LONG TERM GOAL #2   Title  Patient will improve TUG to under 12 seconds to indicate signficant improvement in fall risk.     Baseline  TUG: 24sec; 03/26/2017: TUG: 22sec; 05/12/17: 21.7 sec    Time  8    Period  Weeks    Status  On-going    Target Date  07/07/17      PT LONG TERM GOAL #3   Title  Patient will improve 25minWT to over 109ft to indicate functional improvement in walking tolerance and greater ability to walk in stores before requiring a sitting rest break    Baseline  464ft; 03/26/2017: 553ft; 05/12/17: 453ft (primary deficit is DOE)    Time  8    Period  Weeks    Status  On-going    Target Date  07/07/17      PT LONG TERM GOAL #4   Title  Patient will demonstrate ability to stand for over 2 min to allow for improved ability to perform household chores at home.     Baseline  able to stand for 50sec before requiring a sitting rest break; 03/26/2017: 60 sec before requiring a break; 05/12/17: 60 seconds per pt subjective report    Time  8    Period  Weeks    Status  On-going    Target Date  07/07/17      PT LONG TERM GOAL #5   Title  Pt will decrease 5TSTS to <12 seconds from a chair at 19in in height in order to demonstrate clinically significant improvement in LE strength    Baseline  5xSTS: 14 sec from 22" height chair; 03/26/2017: 12.5sec; 05/12/17: 13.4 sec    Time  8    Period  Weeks    Status  On-going    Target Date  07/07/17            Plan - 05/20/17 1420    Clinical Impression Statement  Patient demonstrates decreased knee extension and quad strength and focused on performing exercises to improve theses strength limitations. Patient demosntrates increased pain with the bending on the knee  thus performed exercises with the knee straight and isometric strength on the quadriceps and hip stabilizers. Patient continues to demonstrate increased  fall risk, and will benefit from further skilled therapy to return to prior level of function.     Rehab Potential  Fair    Clinical Impairments Affecting Rehab Potential  Positive: motivation; Negative: age, chronicity, bilateral knee pain    PT Frequency  2x / week    PT Duration  8 weeks    PT Treatment/Interventions  ADLs/Self Care Home Management;Aquatic Therapy;Canalith Repostioning;Cryotherapy;Electrical Stimulation;Iontophoresis 4mg /ml Dexamethasone;Moist Heat;Traction;Ultrasound;DME Instruction;Gait training;Stair training;Functional mobility training;Therapeutic activities;Therapeutic exercise;Balance training;Neuromuscular re-education;Patient/family education;Wheelchair mobility training;Manual techniques;Passive range of motion;Vestibular    PT Next Visit Plan  Progress strengthening, reinforce HEP    PT Home Exercise Plan  Cervical retraction    Consulted and Agree with Plan of Care  Patient;Family member/caregiver    Family Member Consulted  Daughter       Patient will benefit from skilled therapeutic intervention in order to improve the following deficits and impairments:  Abnormal gait, Decreased strength, Obesity, Decreased activity tolerance, Decreased endurance, Difficulty walking, Decreased balance, Decreased mobility, Decreased coordination, Increased muscle spasms, Postural dysfunction, Hypomobility, Decreased range of motion  Visit Diagnosis: Muscle weakness (generalized)  Difficulty in walking, not elsewhere classified  Unsteadiness on feet     Problem List Patient Active Problem List   Diagnosis Date Noted  . Localized edema 02/13/2017  . Hemorrhoids 05/17/2016  . Obesity (BMI 30-39.9) 04/26/2016  . Multiple lung nodules 04/19/2016  . Chest pain 04/17/2016  . Partial symptomatic epilepsy with complex partial seizures, not intractable, without status epilepticus ([place]) 06/30/2015  . Bronchiectasis without complication ([place]) 35/45/6256  . Imbalance 11/08/2014   . Essential (primary) hypertension 11/08/2014  . Personal history of malignant neoplasm of breast 11/08/2014  . History of malignant carcinoid tumor of bronchus and lung 11/08/2014  . H/O peptic ulcer 11/08/2014  . Asthma, mild intermittent 11/08/2014  . PE (pulmonary embolism) 11/08/2014  . Gonalgia 11/08/2014  . Leg varices 11/08/2014    Blythe Stanford, PT DPT 05/20/2017, 2:29 PM  [facility] [address], [place], [address] Phone: [phone]   Fax:  [phone]  Name: Rebecca Wolf MRN: [id number] Date of Birth: [birth year]

## 2017-05-28 ENCOUNTER — Ambulatory Visit: Payer: Medicare Other

## 2017-06-02 ENCOUNTER — Ambulatory Visit: Payer: Medicare Other | Attending: Physical Medicine and Rehabilitation

## 2017-06-02 DIAGNOSIS — M6281 Muscle weakness (generalized): Secondary | ICD-10-CM | POA: Insufficient documentation

## 2017-06-02 DIAGNOSIS — R2681 Unsteadiness on feet: Secondary | ICD-10-CM | POA: Insufficient documentation

## 2017-06-02 DIAGNOSIS — R262 Difficulty in walking, not elsewhere classified: Secondary | ICD-10-CM | POA: Insufficient documentation

## 2017-06-02 NOTE — Therapy (Signed)
[facility] [address], [place], [address] Phone: [phone]   Fax:  [phone]  Physical Therapy Treatment  Patient Details  Name: Rebecca Wolf MRN: [id number] Date of Birth: 1924/09/10 Referring Provider: Dr. Sharlet Salina   Encounter Date: 06/02/2017  PT End of Session - 06/02/17 1332    Visit Number  15    Number of Visits  32    Date for PT Re-Evaluation  07/07/17    Authorization Type  G Code 2/10    PT Start Time  7026    PT Stop Time  1345    PT Time Calculation (min)  40 min    Equipment Utilized During Treatment  Gait belt    Activity Tolerance  Patient tolerated treatment well    Behavior During Therapy  [facility] for tasks assessed/performed       Past Medical History:  Diagnosis Date  . Asthma   . Breast cancer ([place]) 2003   LT LUMPECTOMY  . Breast cancer ([place]) 2004   LT LUMPECTOMY  . 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  . GERD (gastroesophageal reflux disease)   . Hemorrhoids   . Hypertension   . Personal history of chemotherapy 2004   BREAST CA  . Personal history of radiation therapy 2003   BREAST CA  . Personal history of radiation therapy 2004   BREAST CA  . Polyp of colon 2002   bleeding polyp of right ascending colon  . PUD (peptic ulcer disease)   . Seizures ([place])    epilepsy well controlled    Past Surgical History:  Procedure Laterality Date  . BREAST EXCISIONAL BIOPSY Left 2003   positive  . BREAST EXCISIONAL BIOPSY Left 2004   positive  . BREAST LUMPECTOMY Left 2003   BREAST CA  . BREAST LUMPECTOMY Left 2004   BREAST CA  . HEMICOLECTOMY Right    bowel obstruction  . HEMORRHOID SURGERY    . THORACOTOMY/LOBECTOMY  1999   carcinoid tumor  . TOTAL VAGINAL HYSTERECTOMY    . VARICOSE VEIN SURGERY      There were no vitals filed for this visit.  Subjective Assessment - 06/02/17 1323    Subjective  Patient reports she did not get as much sleep as she  normally does and states she feels a little weak today.     Patient is accompained by:  -- Daughter    Pertinent History  Previously seen for balance disorders and for neck pain over the past year.     Limitations  Walking;Sitting    How long can you walk comfortably?  3 min     Diagnostic tests  None    Patient Stated Goals  To be able to walk without an walker.     Currently in Pain?  No/denies    Pain Onset  1 to 4 weeks ago       TREATMENT  Therapeutic Exercise: Nustep level 4 for 7 min to improve LE strength with use of exercises Sit to stands from sitting with use of UEs - 2 x 10 Ambulation with walking with focus on improving stride length and speed - 3 x 128ft Standing Side stepping - with YTB around knees - x 15 B  Patient reports her legs feel stronger after therapy session    PT Education - 06/02/17 1331    Education provided  Yes    Education Details  form/technique with exercise  Person(s) Educated  Patient    Methods  Explanation;Demonstration    Comprehension  Verbalized understanding;Returned demonstration          PT Long Term Goals - 05/12/17 1345      PT LONG TERM GOAL #1   Title  Patient will be independent with HEP to continue benefits of therapy after discharge.    Baseline  Requires moderate cueing with performance of exercise    Time  8    Period  Weeks    Status  On-going    Target Date  07/07/17      PT LONG TERM GOAL #2   Title  Patient will improve TUG to under 12 seconds to indicate signficant improvement in fall risk.     Baseline  TUG: 24sec; 03/26/2017: TUG: 22sec; 05/12/17: 21.7 sec    Time  8    Period  Weeks    Status  On-going    Target Date  07/07/17      PT LONG TERM GOAL #3   Title  Patient will improve 7minWT to over 1064ft to indicate functional improvement in walking tolerance and greater ability to walk in stores before requiring a sitting rest break    Baseline  455ft; 03/26/2017: 568ft; 05/12/17: 468ft (primary  deficit is DOE)    Time  8    Period  Weeks    Status  On-going    Target Date  07/07/17      PT LONG TERM GOAL #4   Title  Patient will demonstrate ability to stand for over 2 min to allow for improved ability to perform household chores at home.     Baseline  able to stand for 50sec before requiring a sitting rest break; 03/26/2017: 60 sec before requiring a break; 05/12/17: 60 seconds per pt subjective report    Time  8    Period  Weeks    Status  On-going    Target Date  07/07/17      PT LONG TERM GOAL #5   Title  Pt will decrease 5TSTS to <12 seconds from a chair at 19in in height in order to demonstrate clinically significant improvement in LE strength    Baseline  5xSTS: 14 sec from 22" height chair; 03/26/2017: 12.5sec; 05/12/17: 13.4 sec    Time  8    Period  Weeks    Status  On-going    Target Date  07/07/17            Plan - 06/02/17 1339    Clinical Impression Statement  Continued to focus on improving LE strength and ambulation performance; patinet demonstrates increased fatigue with exercises indicating decreased muscular endurance and poor power/strength. These limitations lead to increased risk of falling and patient will benefit from further skilled therapy to return to prior level of function.     Rehab Potential  Fair    Clinical Impairments Affecting Rehab Potential  Positive: motivation; Negative: age, chronicity, bilateral knee pain    PT Frequency  2x / week    PT Duration  8 weeks    PT Treatment/Interventions  ADLs/Self Care Home Management;Aquatic Therapy;Canalith Repostioning;Cryotherapy;Electrical Stimulation;Iontophoresis 4mg /ml Dexamethasone;Moist Heat;Traction;Ultrasound;DME Instruction;Gait training;Stair training;Functional mobility training;Therapeutic activities;Therapeutic exercise;Balance training;Neuromuscular re-education;Patient/family education;Wheelchair mobility training;Manual techniques;Passive range of motion;Vestibular    PT Next Visit  Plan  Progress strengthening, reinforce HEP    PT Home Exercise Plan  Cervical retraction    Consulted and Agree with Plan of Care  Patient;Family member/caregiver    Family  Member Consulted  Daughter       Patient will benefit from skilled therapeutic intervention in order to improve the following deficits and impairments:  Abnormal gait, Decreased strength, Obesity, Decreased activity tolerance, Decreased endurance, Difficulty walking, Decreased balance, Decreased mobility, Decreased coordination, Increased muscle spasms, Postural dysfunction, Hypomobility, Decreased range of motion  Visit Diagnosis: Muscle weakness (generalized)  Difficulty in walking, not elsewhere classified  Unsteadiness on feet     Problem List Patient Active Problem List   Diagnosis Date Noted  . Localized edema 02/13/2017  . Hemorrhoids 05/17/2016  . Obesity (BMI 30-39.9) 04/26/2016  . Multiple lung nodules 04/19/2016  . Chest pain 04/17/2016  . Partial symptomatic epilepsy with complex partial seizures, not intractable, without status epilepticus ([place]) 06/30/2015  . Bronchiectasis without complication ([place]) 53/66/4403  . Imbalance 11/08/2014  . Essential (primary) hypertension 11/08/2014  . Personal history of malignant neoplasm of breast 11/08/2014  . History of malignant carcinoid tumor of bronchus and lung 11/08/2014  . H/O peptic ulcer 11/08/2014  . Asthma, mild intermittent 11/08/2014  . PE (pulmonary embolism) 11/08/2014  . Gonalgia 11/08/2014  . Leg varices 11/08/2014    Blythe Stanford, PT DPT 06/02/2017, 1:47 PM  [facility] [address] [place], [address] Phone: [phone]   Fax:  [phone]  Name: ELEXIS POLLAK MRN: [id number] Date of Birth: Oct 07, 1924

## 2017-06-03 ENCOUNTER — Encounter: Payer: Self-pay | Admitting: Podiatry

## 2017-06-03 ENCOUNTER — Ambulatory Visit (INDEPENDENT_AMBULATORY_CARE_PROVIDER_SITE_OTHER): Payer: Medicare Other | Admitting: Podiatry

## 2017-06-03 DIAGNOSIS — B351 Tinea unguium: Secondary | ICD-10-CM

## 2017-06-03 DIAGNOSIS — M79676 Pain in unspecified toe(s): Secondary | ICD-10-CM

## 2017-06-05 ENCOUNTER — Ambulatory Visit: Payer: Medicare Other

## 2017-06-05 ENCOUNTER — Ambulatory Visit: Payer: Self-pay

## 2017-06-05 NOTE — Progress Notes (Signed)
   SUBJECTIVE Patient presents to office today complaining of elongated, thickened nails. Pain while ambulating in shoes. Patient is unable to trim their own nails.  She reports discoloration to the left great toe and bilateral ankles.  Patient is here for further evaluation and treatment.   Past Medical History:  Diagnosis Date  . Asthma   . Breast cancer ([place]) 2003   LT LUMPECTOMY  . Breast cancer ([place]) 2004   LT LUMPECTOMY  . 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  . GERD (gastroesophageal reflux disease)   . Hemorrhoids   . Hypertension   . Personal history of chemotherapy 2004   BREAST CA  . Personal history of radiation therapy 2003   BREAST CA  . Personal history of radiation therapy 2004   BREAST CA  . Polyp of colon 2002   bleeding polyp of right ascending colon  . PUD (peptic ulcer disease)   . Seizures ([place])    epilepsy well controlled    OBJECTIVE General Patient is awake, alert, and oriented x 3 and in no acute distress. Derm Skin is dry and supple bilateral. Negative open lesions or macerations. Remaining integument unremarkable. Nails are tender, long, thickened and dystrophic with subungual debris, consistent with onychomycosis, 1-5 bilateral. No signs of infection noted. Vasc  DP and PT pedal pulses palpable bilaterally. Temperature gradient within normal limits.  Neuro Epicritic and protective threshold sensation diminished bilaterally.  Musculoskeletal Exam No symptomatic pedal deformities noted bilateral. Muscular strength within normal limits.  ASSESSMENT 1. Onychodystrophic nails 1-5 bilateral with hyperkeratosis of nails.  2. Onychomycosis of nail due to dermatophyte bilateral 3. Pain in foot bilateral  PLAN OF CARE 1. Patient evaluated today.  2. Instructed to maintain good pedal hygiene and foot care.  3. Mechanical debridement of nails 1-5 bilaterally performed using a nail nipper. Filed with dremel without incident.  4. Return to  clinic in 3 mos.    Edrick Kins, DPM [facility]  Dr. Edrick Kins, [address]                                        [place], [place] [address]                Office [phone]  Fax [phone]    She reports discoloration

## 2017-06-09 ENCOUNTER — Ambulatory Visit: Payer: Medicare Other

## 2017-06-11 ENCOUNTER — Ambulatory Visit: Payer: Medicare Other

## 2017-06-11 DIAGNOSIS — R2681 Unsteadiness on feet: Secondary | ICD-10-CM | POA: Diagnosis not present

## 2017-06-11 DIAGNOSIS — R262 Difficulty in walking, not elsewhere classified: Secondary | ICD-10-CM | POA: Diagnosis not present

## 2017-06-11 DIAGNOSIS — M6281 Muscle weakness (generalized): Secondary | ICD-10-CM

## 2017-06-11 NOTE — Therapy (Signed)
[facility] [address], [place], [address] Phone: [phone]   Fax:  [phone]  Physical Therapy Treatment  Patient Details  Name: Rebecca Wolf MRN: [id number] Date of Birth: 03-04-1925 Referring Provider: Dr. Sharlet Salina   Encounter Date: 06/11/2017  PT End of Session - 06/11/17 1238    Visit Number  16    Number of Visits  32    Date for PT Re-Evaluation  07/07/17    Authorization Type  G Code 3/10    PT Start Time  1212    PT Stop Time  1245    PT Time Calculation (min)  33 min    Equipment Utilized During Treatment  Gait belt    Activity Tolerance  Patient tolerated treatment well    Behavior During Therapy  [facility] for tasks assessed/performed       Past Medical History:  Diagnosis Date  . Asthma   . Breast cancer ([place]) 2003   LT LUMPECTOMY  . Breast cancer ([place]) 2004   LT LUMPECTOMY  . 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  . GERD (gastroesophageal reflux disease)   . Hemorrhoids   . Hypertension   . Personal history of chemotherapy 2004   BREAST CA  . Personal history of radiation therapy 2003   BREAST CA  . Personal history of radiation therapy 2004   BREAST CA  . Polyp of colon 2002   bleeding polyp of right ascending colon  . PUD (peptic ulcer disease)   . Seizures ([place])    epilepsy well controlled    Past Surgical History:  Procedure Laterality Date  . BREAST EXCISIONAL BIOPSY Left 2003   positive  . BREAST EXCISIONAL BIOPSY Left 2004   positive  . BREAST LUMPECTOMY Left 2003   BREAST CA  . BREAST LUMPECTOMY Left 2004   BREAST CA  . HEMICOLECTOMY Right    bowel obstruction  . HEMORRHOID SURGERY    . THORACOTOMY/LOBECTOMY  1999   carcinoid tumor  . TOTAL VAGINAL HYSTERECTOMY    . VARICOSE VEIN SURGERY      There were no vitals filed for this visit.  Subjective Assessment - 06/11/17 1234    Subjective  Patient reports she has not been out as much  seoncdary to the recent snow fall.     Patient is accompained by:  -- Daughter    Pertinent History  Previously seen for balance disorders and for neck pain over the past year.     Limitations  Walking;Sitting    How long can you walk comfortably?  3 min     Diagnostic tests  None    Patient Stated Goals  To be able to walk without an walker.     Currently in Pain?  No/denies    Pain Onset  1 to 4 weeks ago        TREATMENT  Therapeutic Exercise: Nustep level 3 for 6 min to improve LE strength with use of exercises Sit to stands from sitting with use of UEs -  x 10 Ambulation with walking with focus on improving stride length and speed - 3 x 159ft\ Step ups on the R LE - x 5 with 6"    Patient reports her legs feel stronger after therapy session   PT Education - 06/11/17 1237    Education provided  Yes    Education Details  form/technique with exercise    Person(s) Educated  Patient  Methods  Explanation;Demonstration    Comprehension  Verbalized understanding;Returned demonstration          PT Long Term Goals - 05/12/17 1345      PT LONG TERM GOAL #1   Title  Patient will be independent with HEP to continue benefits of therapy after discharge.    Baseline  Requires moderate cueing with performance of exercise    Time  8    Period  Weeks    Status  On-going    Target Date  07/07/17      PT LONG TERM GOAL #2   Title  Patient will improve TUG to under 12 seconds to indicate signficant improvement in fall risk.     Baseline  TUG: 24sec; 03/26/2017: TUG: 22sec; 05/12/17: 21.7 sec    Time  8    Period  Weeks    Status  On-going    Target Date  07/07/17      PT LONG TERM GOAL #3   Title  Patient will improve 37minWT to over 1043ft to indicate functional improvement in walking tolerance and greater ability to walk in stores before requiring a sitting rest break    Baseline  469ft; 03/26/2017: 565ft; 05/12/17: 463ft (primary deficit is DOE)    Time  8    Period  Weeks     Status  On-going    Target Date  07/07/17      PT LONG TERM GOAL #4   Title  Patient will demonstrate ability to stand for over 2 min to allow for improved ability to perform household chores at home.     Baseline  able to stand for 50sec before requiring a sitting rest break; 03/26/2017: 60 sec before requiring a break; 05/12/17: 60 seconds per pt subjective report    Time  8    Period  Weeks    Status  On-going    Target Date  07/07/17      PT LONG TERM GOAL #5   Title  Pt will decrease 5TSTS to <12 seconds from a chair at 19in in height in order to demonstrate clinically significant improvement in LE strength    Baseline  5xSTS: 14 sec from 22" height chair; 03/26/2017: 12.5sec; 05/12/17: 13.4 sec    Time  8    Period  Weeks    Status  On-going    Target Date  07/07/17            Plan - 06/11/17 1242    Clinical Impression Statement  Patient demonstrates increased fatigue during the session requiring frequent sitting rest breaks indicating decreased muscular and cardiovascular endurance. Patient demonstrates increased breathlessness after walking; monitored throughout session with no adverse affects noted. Patient will benefit from further skilled therapy focused on improving current limitations to return to prior level of function.     Rehab Potential  Fair    Clinical Impairments Affecting Rehab Potential  Positive: motivation; Negative: age, chronicity, bilateral knee pain    PT Frequency  2x / week    PT Duration  8 weeks    PT Treatment/Interventions  ADLs/Self Care Home Management;Aquatic Therapy;Canalith Repostioning;Cryotherapy;Electrical Stimulation;Iontophoresis 4mg /ml Dexamethasone;Moist Heat;Traction;Ultrasound;DME Instruction;Gait training;Stair training;Functional mobility training;Therapeutic activities;Therapeutic exercise;Balance training;Neuromuscular re-education;Patient/family education;Wheelchair mobility training;Manual techniques;Passive range of  motion;Vestibular    PT Next Visit Plan  Progress strengthening, reinforce HEP    PT Home Exercise Plan  Cervical retraction    Consulted and Agree with Plan of Care  Patient;Family member/caregiver    Family Member Consulted  Daughter       Patient will benefit from skilled therapeutic intervention in order to improve the following deficits and impairments:  Abnormal gait, Decreased strength, Obesity, Decreased activity tolerance, Decreased endurance, Difficulty walking, Decreased balance, Decreased mobility, Decreased coordination, Increased muscle spasms, Postural dysfunction, Hypomobility, Decreased range of motion  Visit Diagnosis: Muscle weakness (generalized)  Difficulty in walking, not elsewhere classified  Unsteadiness on feet     Problem List Patient Active Problem List   Diagnosis Date Noted  . Localized edema 02/13/2017  . Hemorrhoids 05/17/2016  . Obesity (BMI 30-39.9) 04/26/2016  . Multiple lung nodules 04/19/2016  . Chest pain 04/17/2016  . Partial symptomatic epilepsy with complex partial seizures, not intractable, without status epilepticus ([place]) 06/30/2015  . Bronchiectasis without complication ([place]) 17/40/8144  . Imbalance 11/08/2014  . Essential (primary) hypertension 11/08/2014  . Personal history of malignant neoplasm of breast 11/08/2014  . History of malignant carcinoid tumor of bronchus and lung 11/08/2014  . H/O peptic ulcer 11/08/2014  . Asthma, mild intermittent 11/08/2014  . PE (pulmonary embolism) 11/08/2014  . Gonalgia 11/08/2014  . Leg varices 11/08/2014    Blythe Stanford, PT DPT 06/11/2017, 12:53 PM  [facility] [address] [place], [address] Phone: [phone]   Fax:  [phone]  Name: Rebecca Wolf MRN: [id number] Date of Birth: Mar 13, 1925

## 2017-06-16 ENCOUNTER — Ambulatory Visit: Payer: Medicare Other

## 2017-06-16 DIAGNOSIS — R2681 Unsteadiness on feet: Secondary | ICD-10-CM

## 2017-06-16 DIAGNOSIS — R262 Difficulty in walking, not elsewhere classified: Secondary | ICD-10-CM | POA: Diagnosis not present

## 2017-06-16 DIAGNOSIS — M6281 Muscle weakness (generalized): Secondary | ICD-10-CM

## 2017-06-16 NOTE — Therapy (Signed)
[facility] [address], [place], [address] Phone: [phone]   Fax:  [phone]  Physical Therapy Treatment  Patient Details  Name: Rebecca Wolf MRN: [id number] Date of Birth: 1924-10-26 Referring Provider: Dr. Sharlet Salina   Encounter Date: 06/16/2017  PT End of Session - 06/16/17 1328    Visit Number  17    Number of Visits  32    Date for PT Re-Evaluation  07/07/17    Authorization Type  G Code 4/10    PT Start Time  1302    PT Stop Time  1345    PT Time Calculation (min)  43 min    Equipment Utilized During Treatment  Gait belt    Activity Tolerance  Patient tolerated treatment well    Behavior During Therapy  [facility] for tasks assessed/performed       Past Medical History:  Diagnosis Date  . Asthma   . Breast cancer ([place]) 2003   LT LUMPECTOMY  . Breast cancer ([place]) 2004   LT LUMPECTOMY  . 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  . GERD (gastroesophageal reflux disease)   . Hemorrhoids   . Hypertension   . Personal history of chemotherapy 2004   BREAST CA  . Personal history of radiation therapy 2003   BREAST CA  . Personal history of radiation therapy 2004   BREAST CA  . Polyp of colon 2002   bleeding polyp of right ascending colon  . PUD (peptic ulcer disease)   . Seizures ([place])    epilepsy well controlled    Past Surgical History:  Procedure Laterality Date  . BREAST EXCISIONAL BIOPSY Left 2003   positive  . BREAST EXCISIONAL BIOPSY Left 2004   positive  . BREAST LUMPECTOMY Left 2003   BREAST CA  . BREAST LUMPECTOMY Left 2004   BREAST CA  . HEMICOLECTOMY Right    bowel obstruction  . HEMORRHOID SURGERY    . THORACOTOMY/LOBECTOMY  1999   carcinoid tumor  . TOTAL VAGINAL HYSTERECTOMY    . VARICOSE VEIN SURGERY      There were no vitals filed for this visit.  Subjective Assessment - 06/16/17 1310    Subjective  Patient reports no major changes since the previous  treatment session. Patient reports she's been more out of breath more recently.     Patient is accompained by:  -- Daughter    Pertinent History  Previously seen for balance disorders and for neck pain over the past year.     Limitations  Walking;Sitting    How long can you walk comfortably?  3 min     Diagnostic tests  None    Patient Stated Goals  To be able to walk without an walker.     Currently in Pain?  No/denies    Pain Onset  1 to 4 weeks ago       TREATMENT  Therapeutic Exercise: Nustep level 3 for 6 min to improve LE strength with use of exercises Heel raises off airex beam - x 20 Ambulation with walking with focus on improving stride length and speed -  x 137ft Step ups B - x 5 with 6"    Patient reports her legs feel stronger after therapy session   PT Education - 06/16/17 1321    Education provided  Yes    Education Details  form/technique with exercise    Person(s) Educated  Patient    Methods  Explanation;Demonstration    Comprehension  Verbalized understanding;Returned demonstration          PT Long Term Goals - 05/12/17 1345      PT LONG TERM GOAL #1   Title  Patient will be independent with HEP to continue benefits of therapy after discharge.    Baseline  Requires moderate cueing with performance of exercise    Time  8    Period  Weeks    Status  On-going    Target Date  07/07/17      PT LONG TERM GOAL #2   Title  Patient will improve TUG to under 12 seconds to indicate signficant improvement in fall risk.     Baseline  TUG: 24sec; 03/26/2017: TUG: 22sec; 05/12/17: 21.7 sec    Time  8    Period  Weeks    Status  On-going    Target Date  07/07/17      PT LONG TERM GOAL #3   Title  Patient will improve 60minWT to over 10104ft to indicate functional improvement in walking tolerance and greater ability to walk in stores before requiring a sitting rest break    Baseline  466ft; 03/26/2017: 590ft; 05/12/17: 440ft (primary deficit is DOE)    Time  8     Period  Weeks    Status  On-going    Target Date  07/07/17      PT LONG TERM GOAL #4   Title  Patient will demonstrate ability to stand for over 2 min to allow for improved ability to perform household chores at home.     Baseline  able to stand for 50sec before requiring a sitting rest break; 03/26/2017: 60 sec before requiring a break; 05/12/17: 60 seconds per pt subjective report    Time  8    Period  Weeks    Status  On-going    Target Date  07/07/17      PT LONG TERM GOAL #5   Title  Pt will decrease 5TSTS to <12 seconds from a chair at 19in in height in order to demonstrate clinically significant improvement in LE strength    Baseline  5xSTS: 14 sec from 22" height chair; 03/26/2017: 12.5sec; 05/12/17: 13.4 sec    Time  8    Period  Weeks    Status  On-going    Target Date  07/07/17            Plan - 06/16/17 1335    Clinical Impression Statement  Patient demonstrates decreased breathlessness compared to previous treatment session but continues to demonstrate increased difficulty required frequent rest breaks during treatment session. Patient continues with decreased endurance and will benefit from further skilled therapy to return to prior level of function.     Rehab Potential  Fair    Clinical Impairments Affecting Rehab Potential  Positive: motivation; Negative: age, chronicity, bilateral knee pain    PT Frequency  2x / week    PT Duration  8 weeks    PT Treatment/Interventions  ADLs/Self Care Home Management;Aquatic Therapy;Canalith Repostioning;Cryotherapy;Electrical Stimulation;Iontophoresis 4mg /ml Dexamethasone;Moist Heat;Traction;Ultrasound;DME Instruction;Gait training;Stair training;Functional mobility training;Therapeutic activities;Therapeutic exercise;Balance training;Neuromuscular re-education;Patient/family education;Wheelchair mobility training;Manual techniques;Passive range of motion;Vestibular    PT Next Visit Plan  Progress strengthening, reinforce HEP     PT Home Exercise Plan  Cervical retraction    Consulted and Agree with Plan of Care  Patient;Family member/caregiver    Family Member Consulted  Daughter       Patient will benefit from  skilled therapeutic intervention in order to improve the following deficits and impairments:  Abnormal gait, Decreased strength, Obesity, Decreased activity tolerance, Decreased endurance, Difficulty walking, Decreased balance, Decreased mobility, Decreased coordination, Increased muscle spasms, Postural dysfunction, Hypomobility, Decreased range of motion  Visit Diagnosis: Muscle weakness (generalized)  Difficulty in walking, not elsewhere classified  Unsteadiness on feet     Problem List Patient Active Problem List   Diagnosis Date Noted  . Localized edema 02/13/2017  . Hemorrhoids 05/17/2016  . Obesity (BMI 30-39.9) 04/26/2016  . Multiple lung nodules 04/19/2016  . Chest pain 04/17/2016  . Partial symptomatic epilepsy with complex partial seizures, not intractable, without status epilepticus ([place]) 06/30/2015  . Bronchiectasis without complication ([place]) 91/50/5697  . Imbalance 11/08/2014  . Essential (primary) hypertension 11/08/2014  . Personal history of malignant neoplasm of breast 11/08/2014  . History of malignant carcinoid tumor of bronchus and lung 11/08/2014  . H/O peptic ulcer 11/08/2014  . Asthma, mild intermittent 11/08/2014  . PE (pulmonary embolism) 11/08/2014  . Gonalgia 11/08/2014  . Leg varices 11/08/2014    Blythe Stanford, PT DPT 06/16/2017, 1:47 PM  [facility] [address], [place], [address] Phone: [phone]   Fax:  [phone]  Name: Rebecca Wolf MRN: [id number] Date of Birth: March 28, 1925

## 2017-06-18 ENCOUNTER — Telehealth: Payer: Self-pay

## 2017-06-18 ENCOUNTER — Ambulatory Visit: Payer: Medicare Other

## 2017-06-18 NOTE — Telephone Encounter (Signed)
LVM for patient callback with information for daughter per Rebecca Wolf.  - Can try preperation H, warm water sitz bath- if having severe pain, bleeding then needs to come to get evaluated.  [phone] daughter Rebecca Wolf called. Thinks they talked to Dr Vicente Wolf before about a rectal prolapse. Having issues with a sore around the anal/anal prolapse area and didn't know if this is something Dr Vicente Wolf could help with. Please call her daughter back.

## 2017-06-21 ENCOUNTER — Other Ambulatory Visit: Payer: Self-pay | Admitting: Internal Medicine

## 2017-06-29 ENCOUNTER — Other Ambulatory Visit: Payer: Self-pay

## 2017-06-29 ENCOUNTER — Emergency Department: Payer: Medicare Other

## 2017-06-29 ENCOUNTER — Emergency Department
Admission: EM | Admit: 2017-06-29 | Discharge: 2017-06-29 | Disposition: A | Discharge status: Home / Self Care | Payer: Medicare Other | Attending: Emergency Medicine | Admitting: Emergency Medicine

## 2017-06-29 ENCOUNTER — Encounter: Payer: Self-pay | Admitting: Radiology

## 2017-06-29 DIAGNOSIS — Z7901 Long term (current) use of anticoagulants: Secondary | ICD-10-CM | POA: Insufficient documentation

## 2017-06-29 DIAGNOSIS — I10 Essential (primary) hypertension: Secondary | ICD-10-CM | POA: Insufficient documentation

## 2017-06-29 DIAGNOSIS — J45909 Unspecified asthma, uncomplicated: Secondary | ICD-10-CM | POA: Insufficient documentation

## 2017-06-29 DIAGNOSIS — R569 Unspecified convulsions: Secondary | ICD-10-CM

## 2017-06-29 DIAGNOSIS — I700 Atherosclerosis of aorta: Secondary | ICD-10-CM | POA: Diagnosis not present

## 2017-06-29 DIAGNOSIS — Z79899 Other long term (current) drug therapy: Secondary | ICD-10-CM | POA: Insufficient documentation

## 2017-06-29 DIAGNOSIS — Z87891 Personal history of nicotine dependence: Secondary | ICD-10-CM | POA: Insufficient documentation

## 2017-06-29 DIAGNOSIS — G9340 Encephalopathy, unspecified: Secondary | ICD-10-CM | POA: Diagnosis not present

## 2017-06-29 DIAGNOSIS — R0602 Shortness of breath: Secondary | ICD-10-CM | POA: Diagnosis not present

## 2017-06-29 LAB — URINALYSIS, COMPLETE (UACMP) WITH MICROSCOPIC
BACTERIA UA: NONE SEEN
Bilirubin Urine: NEGATIVE
Glucose, UA: NEGATIVE mg/dL
Hgb urine dipstick: NEGATIVE
Ketones, ur: NEGATIVE mg/dL
LEUKOCYTES UA: NEGATIVE
Nitrite: NEGATIVE
PH: 6 (ref 5.0–8.0)
Protein, ur: NEGATIVE mg/dL
RBC / HPF: NONE SEEN RBC/hpf (ref 0–5)
SPECIFIC GRAVITY, URINE: 1.008 (ref 1.005–1.030)

## 2017-06-29 LAB — BASIC METABOLIC PANEL
ANION GAP: 7 (ref 5–15)
BUN: 21 mg/dL — AB (ref 6–20)
CALCIUM: 9.1 mg/dL (ref 8.9–10.3)
CO2: 27 mmol/L (ref 22–32)
CREATININE: 1.07 mg/dL — AB (ref 0.44–1.00)
Chloride: 102 mmol/L (ref 101–111)
GFR calc Af Amer: 51 mL/min — ABNORMAL LOW (ref >60)
GFR, EST NON AFRICAN AMERICAN: 44 mL/min — AB (ref >60)
GLUCOSE: 198 mg/dL — AB (ref 65–99)
Potassium: 3.6 mmol/L (ref 3.5–5.1)
Sodium: 136 mmol/L (ref 135–145)

## 2017-06-29 LAB — CBC WITH DIFFERENTIAL/PLATELET
BASOS ABS: 0 10*3/uL (ref 0–0.1)
Basophils Relative: 1 %
EOS PCT: 4 %
Eosinophils Absolute: 0.3 10*3/uL (ref 0–0.7)
HEMATOCRIT: 37.9 % (ref 35.0–47.0)
Hemoglobin: 12.3 g/dL (ref 12.0–16.0)
LYMPHS PCT: 27 %
Lymphs Abs: 2.1 10*3/uL (ref 1.0–3.6)
MCH: 25.1 pg — ABNORMAL LOW (ref 26.0–34.0)
MCHC: 32.5 g/dL (ref 32.0–36.0)
MCV: 77.2 fL — AB (ref 80.0–100.0)
MONO ABS: 0.5 10*3/uL (ref 0.2–0.9)
MONOS PCT: 6 %
Neutro Abs: 4.7 10*3/uL (ref 1.4–6.5)
Neutrophils Relative %: 62 %
PLATELETS: 219 10*3/uL (ref 150–440)
RBC: 4.92 MIL/uL (ref 3.80–5.20)
RDW: 14.6 % — AB (ref 11.5–14.5)
WBC: 7.7 10*3/uL (ref 3.6–11.0)

## 2017-06-29 MED ORDER — IOPAMIDOL (ISOVUE-370) INJECTION 76%
60.0000 mL | Freq: Once | INTRAVENOUS | Status: AC | PRN
  Administered 2017-06-29: 60 mL via INTRAVENOUS

## 2017-06-29 MED ORDER — SODIUM CHLORIDE 0.9 % IV BOLUS (SEPSIS)
1000.0000 mL | Freq: Once | INTRAVENOUS | Status: AC
  Administered 2017-06-29: 1000 mL via INTRAVENOUS

## 2017-06-29 NOTE — ED Provider Notes (Signed)
[facility] Emergency Department Provider Note  ____________________________________________  Time seen: Approximately 5:13 PM  I have reviewed the triage vital signs and the nursing notes.   HISTORY  Chief Complaint Seizures   HPI Rebecca Wolf is a 83 y.o. female with a history of seizure disorder on Keppra who presents for evaluation of a seizure episode. According to patient's daughter she was sitting on her chair when she started staring into space which is consistent with patient's seizures but then she started to have generalized tonic-clonic movement. The whole episode lasted 3-4 minutes. Patient was confused afterwards. No urinary or bowel loss. By the time paramedics arrived patient was starting to clear up. According to the daughter and the patient she has not missed any doses of her Keppra. Patient has not have a seizure in more than 15 years since being started on Keppra. She also has a history of bronchiectasis and DVT and PE for which she is on Eliquis. Over the last few weeks patientreports that she has been feeling slightly more short of breath than her baseline with exertion. She has no shortness of breath at rest. She denies chest pain, URI symptoms, fever or chills, cough. She endorses compliance with all of her medications. No headache, no facial droop, no slurred speech, no vomiting, no diarrhea, no dysuria, no abdominal pain.  Past Medical History:  Diagnosis Date  . Asthma   . Breast cancer ([place]) 2003   LT LUMPECTOMY  . Breast cancer ([place]) 2004   LT LUMPECTOMY  . 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  . GERD (gastroesophageal reflux disease)   . Hemorrhoids   . Hypertension   . Personal history of chemotherapy 2004   BREAST CA  . Personal history of radiation therapy 2003   BREAST CA  . Personal history of radiation therapy 2004   BREAST CA  . Polyp of colon 2002   bleeding polyp of right ascending colon  . PUD  (peptic ulcer disease)   . Seizures ([place])    epilepsy well controlled    Patient Active Problem List   Diagnosis Date Noted  . Localized edema 02/13/2017  . Hemorrhoids 05/17/2016  . Obesity (BMI 30-39.9) 04/26/2016  . Multiple lung nodules 04/19/2016  . Chest pain 04/17/2016  . Partial symptomatic epilepsy with complex partial seizures, not intractable, without status epilepticus ([place]) 06/30/2015  . Bronchiectasis without complication ([place]) 25/42/7062  . Imbalance 11/08/2014  . Essential (primary) hypertension 11/08/2014  . Personal history of malignant neoplasm of breast 11/08/2014  . History of malignant carcinoid tumor of bronchus and lung 11/08/2014  . H/O peptic ulcer 11/08/2014  . Asthma, mild intermittent 11/08/2014  . PE (pulmonary embolism) 11/08/2014  . Gonalgia 11/08/2014  . Leg varices 11/08/2014    Past Surgical History:  Procedure Laterality Date  . BREAST EXCISIONAL BIOPSY Left 2003   positive  . BREAST EXCISIONAL BIOPSY Left 2004   positive  . BREAST LUMPECTOMY Left 2003   BREAST CA  . BREAST LUMPECTOMY Left 2004   BREAST CA  . HEMICOLECTOMY Right    bowel obstruction  . HEMORRHOID SURGERY    . THORACOTOMY/LOBECTOMY  1999   carcinoid tumor  . TOTAL VAGINAL HYSTERECTOMY    . VARICOSE VEIN SURGERY      Prior to Admission medications   Medication Sig Start Date End Date Taking? Authorizing Provider  acetaminophen (TYLENOL) 500 MG tablet Take 500 mg by mouth every 6 (six) hours as needed.  Yes [provider]  albuterol (ACCUNEB) 1.25 MG/3ML nebulizer solution Inhale 1 ampule into the lungs 4 (four) times daily as needed. 05/06/14  Yes [provider]  amLODipine (NORVASC) 5 MG tablet TAKE 1 TABLET BY MOUTH EVERY DAY 10/27/16  Yes Glean Hess, MD  ELIQUIS 2.5 MG TABS tablet TAKE 1 TABLET BY MOUTH TWICE DAILY 09/25/15  Yes Glean Hess, MD  fluticasone [facility]) 50 MCG/ACT nasal spray Place 2 sprays into the nose daily.   Yes  [provider]  Fluticasone-Salmeterol (ADVAIR) 500-50 MCG/DOSE AEPB Inhale 1 puff into the lungs 2 (two) times daily.   Yes [provider]  levETIRAcetam (KEPPRA) 750 MG tablet Take 1 tablet by mouth 2 (two) times daily. 06/29/13  Yes [provider]  montelukast (SINGULAIR) 10 MG tablet Take 1 tablet by mouth daily.   Yes [provider]  Olopatadine HCl (PATANASE) 0.6 % SOLN Place 1 puff into the nose 2 (two) times daily.   Yes [provider]  pantoprazole (PROTONIX) 40 MG tablet TAKE 1 TABLET BY MOUTH TWICE DAILY 06/21/17  Yes Glean Hess, MD  spironolactone-hydrochlorothiazide (ALDACTAZIDE) 25-25 MG tablet TAKE 1 TABLET BY MOUTH DAILY 01/12/17  Yes Glean Hess, MD  sucralfate (CARAFATE) 1 g tablet Take 1 tablet (1 g total) by mouth 4 (four) times daily -  with meals and at bedtime. Patient not taking: Reported on 06/29/2017 08/20/16   Jonathon Bellows, MD    Allergies Levofloxacin and Sulfa antibiotics  Family History  Problem Relation Age of Onset  . Alcoholism Father   . Diabetes Sister   . Breast cancer Neg Hx     Social History Social History   Tobacco Use  . Smoking status: Former Research scientist (life sciences)  . Smokeless tobacco: Never Used  Substance Use Topics  . Alcohol use: No    Alcohol/week: 0.0 oz  . Drug use: No    Review of Systems  Constitutional: Negative for fever. Eyes: Negative for visual changes. ENT: Negative for sore throat. Neck: No neck pain  Cardiovascular: Negative for chest pain. Respiratory: + shortness of breath. Gastrointestinal: Negative for abdominal pain, vomiting or diarrhea. Genitourinary: Negative for dysuria. Musculoskeletal: Negative for back pain. Skin: Negative for rash. Neurological: Negative for headaches, weakness or numbness. + seizure Psych: No SI or HI  ____________________________________________   PHYSICAL EXAM:  VITAL SIGNS: ED Triage Vitals  Enc Vitals Group     BP 06/29/17  1627 123/80     Pulse Rate 06/29/17 1627 (!) 120     Resp 06/29/17 1627 (!) 22     Temp 06/29/17 1627 98.1 F (36.7 C)     Temp Source 06/29/17 1627 Oral     SpO2 06/29/17 1627 96 %     Weight 06/29/17 1629 190 lb (86.2 kg)     Height 06/29/17 1629 5\' 4"  (1.626 m)     Head Circumference --      Peak Flow --      Pain Score --      Pain Loc --      Pain Edu? --      Excl. in [place]? --     Constitutional: Alert and oriented. Well appearing and in no apparent distress. HEENT:      Head: Normocephalic and atraumatic.         Eyes: Conjunctivae are normal. Sclera is non-icteric.       Mouth/Throat: Mucous membranes are moist.       Neck: Supple  with no signs of meningismus. Cardiovascular: Tachycardic with regular rhythm. No murmurs, gallops, or rubs. 2+ symmetrical distal pulses are present in all extremities. No JVD. Respiratory: Normal respiratory effort. Lungs are clear to auscultation bilaterally. No wheezes, crackles, or rhonchi.  Gastrointestinal: Soft, non tender, and non distended with positive bowel sounds. No rebound or guarding. Musculoskeletal: Trace pitting edema bilaterally Neurologic: Normal speech and language. A & O x3, PERRL, EOMI, no nystagmus, CN II-XII intact, motor testing reveals good tone and bulk throughout. There is no evidence of pronator drift or dysmetria. Muscle strength is 5/5 throughout.  Sensory examination is intact. Gait deferred Skin: Skin is warm, dry and intact. No rash noted. Psychiatric: Mood and affect are normal. Speech and behavior are normal.  ____________________________________________   LABS (all labs ordered are listed, but only abnormal results are displayed)  Labs Reviewed  CBC WITH DIFFERENTIAL/PLATELET - Abnormal; Notable for the following components:      Result Value   MCV 77.2 (*)    MCH 25.1 (*)    RDW 14.6 (*)    All other components within normal limits  BASIC METABOLIC PANEL - Abnormal; Notable for the following  components:   Glucose, Bld 198 (*)    BUN 21 (*)    Creatinine, Ser 1.07 (*)    GFR calc non Af Amer 44 (*)    GFR calc Af Amer 51 (*)    All other components within normal limits  URINALYSIS, COMPLETE (UACMP) WITH MICROSCOPIC - Abnormal; Notable for the following components:   Color, Urine STRAW (*)    APPearance CLEAR (*)    Squamous Epithelial / LPF 0-5 (*)    All other components within normal limits  LEVETIRACETAM LEVEL   ____________________________________________  EKG  ED ECG REPORT I, Rudene Re, the attending physician, personally viewed and interpreted this ECG.  Sinus tachycardia, rate of 120, normal intervals, left axis deviation, LVH, no ST elevations or depressions. Unchanged from prior from 03/2016 ____________________________________________  RADIOLOGY  Head CT: . No evidence of acute intracranial abnormality. 2. Generalized cerebral volume loss and mild chronic small vessel ischemic changes in the cerebral white matter. 3. Chronic appearing paranasal sinusitis.  CXR: negative  CTA chest: 1. No evidence of significant pulmonary embolus. 2. Unchanged appearance of right paratracheal nodule and multiple pulmonary nodules. 3. No evidence of active pulmonary disease. Scarring throughout the lungs. 4. Aortic atherosclerosis. ____________________________________________   PROCEDURES  Procedure(s) performed: None Procedures Critical Care performed:  None ____________________________________________   INITIAL IMPRESSION / ASSESSMENT AND PLAN / ED COURSE  83 y.o. female with a history of seizure disorder on Keppra, bronchiectasis, DVT/PE on Eliquis who presents for evaluation of a seizure episode. This is patient's first seizure in more than 15 years since being started on Keppra. Patient endorses compliance with her medication and denies missing any doses. No recent weight changes or recent changes in her dosage. Patient has had no recent  illnesses. Her only complain is mildly worse dyspnea on exertion for the last few weeks. Patient does have a history of bronchiectasis. She has not been using her inhalers at home which are prescribed only as needed. She has no chest pain. She has not had a breakthrough clot since being started on Eliquis. Since this is her first episode of seizure in greater than 15 years and the episode itself is slightly different than her usual seizures I will send patient for a head CT. We'll check Keppra level. We'll check electrolytes, we'll check urine  to rule out UTI or dehydration. Patient is persistently tachycardic with an EKG showing sinus tachycardia with a rate of 120. She reports that she hasn't had anything to eat or drink since last night. She is not on beta blockers or calcium channel blockers. She has no chest pain or shortness of breath at rest, no oxygen requirement, lungs are CTAB. PE is a possibility with tachycardia and DOE with clear lungs although unlikely since she is on Eliquis. If no further etiology for patient breakthrough seizure is identified with labs and imaging as mentioned above, will plan on CTA chest. We'll give her IV fluids.    _________________________ 8:38 PM on 06/29/2017 -----------------------------------------  Labs, UA, CT scan of the head and chest with no acute findings. Patient's heart rate improved with IV fluids most likely from dehydration. She took her evening dose of Keppra while in the emergency department. Since this is her first breakthrough seizure 15 years I will not make any changes to her Her regimen at this time. I recommended that she calls her neurologist first thing in the morning. Her daughter is at the bedside and agrees with this plan. Discussed return precautions with patient. She is going to be discharged home at this time.   As part of my medical decision making, I reviewed the following data within the [facility] notes  reviewed and incorporated, Labs reviewed , EKG interpreted , Old EKG reviewed, Radiograph reviewed , Notes from prior ED visits and [place] Controlled Substance Database    Pertinent labs & imaging results that were available during my care of the patient were reviewed by me and considered in my medical decision making (see chart for details).    ____________________________________________   FINAL CLINICAL IMPRESSION(S) / ED DIAGNOSES  Final diagnoses:  Seizure ([place])  Shortness of breath      NEW MEDICATIONS STARTED DURING THIS VISIT:  ED Discharge Orders    None       Note:  This document was prepared using Dragon voice recognition software and may include unintentional dictation errors.    Rudene Re, MD 06/29/17 2045

## 2017-06-29 NOTE — ED Triage Notes (Signed)
Pt arrived via EMS from home d/t seizure activity reported by her daughter. EMS reports pt was A&O x4 upon arrival and ambulatory. Pt has hx of seizures but has not had one in about 15 years. EMS reports pt daughter stating the pt has had some SOB this past week.

## 2017-06-29 NOTE — ED Notes (Signed)
Patient transported to CT 

## 2017-06-30 ENCOUNTER — Ambulatory Visit: Payer: Medicare Other

## 2017-07-02 DIAGNOSIS — G40209 Localization-related (focal) (partial) symptomatic epilepsy and epileptic syndromes with complex partial seizures, not intractable, without status epilepticus: Secondary | ICD-10-CM | POA: Diagnosis not present

## 2017-07-03 LAB — LEVETIRACETAM LEVEL: Levetiracetam Lvl: 45.9 ug/mL — ABNORMAL HIGH (ref 10.0–40.0)

## 2017-07-07 ENCOUNTER — Ambulatory Visit: Payer: Medicare Other | Attending: Physical Medicine and Rehabilitation

## 2017-07-07 DIAGNOSIS — M6281 Muscle weakness (generalized): Secondary | ICD-10-CM

## 2017-07-07 DIAGNOSIS — Z86718 Personal history of other venous thrombosis and embolism: Secondary | ICD-10-CM | POA: Diagnosis not present

## 2017-07-07 DIAGNOSIS — R531 Weakness: Secondary | ICD-10-CM | POA: Diagnosis not present

## 2017-07-07 DIAGNOSIS — R262 Difficulty in walking, not elsewhere classified: Secondary | ICD-10-CM

## 2017-07-07 DIAGNOSIS — R2681 Unsteadiness on feet: Secondary | ICD-10-CM

## 2017-07-07 DIAGNOSIS — G40309 Generalized idiopathic epilepsy and epileptic syndromes, not intractable, without status epilepticus: Secondary | ICD-10-CM | POA: Diagnosis not present

## 2017-07-07 DIAGNOSIS — R0602 Shortness of breath: Secondary | ICD-10-CM | POA: Diagnosis not present

## 2017-07-07 DIAGNOSIS — K219 Gastro-esophageal reflux disease without esophagitis: Secondary | ICD-10-CM | POA: Diagnosis not present

## 2017-07-07 DIAGNOSIS — E669 Obesity, unspecified: Secondary | ICD-10-CM | POA: Diagnosis not present

## 2017-07-07 DIAGNOSIS — R011 Cardiac murmur, unspecified: Secondary | ICD-10-CM | POA: Diagnosis not present

## 2017-07-07 DIAGNOSIS — R911 Solitary pulmonary nodule: Secondary | ICD-10-CM | POA: Diagnosis not present

## 2017-07-07 DIAGNOSIS — R601 Generalized edema: Secondary | ICD-10-CM | POA: Diagnosis not present

## 2017-07-07 DIAGNOSIS — R9431 Abnormal electrocardiogram [ECG] [EKG]: Secondary | ICD-10-CM | POA: Diagnosis not present

## 2017-07-07 DIAGNOSIS — I208 Other forms of angina pectoris: Secondary | ICD-10-CM | POA: Diagnosis not present

## 2017-07-07 DIAGNOSIS — R000 Tachycardia, unspecified: Secondary | ICD-10-CM | POA: Diagnosis not present

## 2017-07-07 NOTE — Therapy (Signed)
[facility] [address] [place], [address] Phone: [phone]   Fax:  [phone]  Physical Therapy Treatment  Patient Details  Name: Rebecca Wolf MRN: [id number] Date of Birth: 09-Jan-1925 Referring Provider: Dr. Sharlet Salina   Encounter Date: 07/07/2017  PT End of Session - 07/07/17 1344    Visit Number  18    Number of Visits  32    Date for PT Re-Evaluation  07/07/17    PT Start Time  1300    PT Stop Time  1345    PT Time Calculation (min)  45 min    Equipment Utilized During Treatment  Gait belt    Activity Tolerance  Patient tolerated treatment well    Behavior During Therapy  [facility] for tasks assessed/performed       Past Medical History:  Diagnosis Date  . Asthma   . Breast cancer ([place]) 2003   LT LUMPECTOMY  . Breast cancer ([place]) 2004   LT LUMPECTOMY  . 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  . GERD (gastroesophageal reflux disease)   . Hemorrhoids   . Hypertension   . Personal history of chemotherapy 2004   BREAST CA  . Personal history of radiation therapy 2003   BREAST CA  . Personal history of radiation therapy 2004   BREAST CA  . Polyp of colon 2002   bleeding polyp of right ascending colon  . PUD (peptic ulcer disease)   . Seizures ([place])    epilepsy well controlled    Past Surgical History:  Procedure Laterality Date  . BREAST EXCISIONAL BIOPSY Left 2003   positive  . BREAST EXCISIONAL BIOPSY Left 2004   positive  . BREAST LUMPECTOMY Left 2003   BREAST CA  . BREAST LUMPECTOMY Left 2004   BREAST CA  . HEMICOLECTOMY Right    bowel obstruction  . HEMORRHOID SURGERY    . THORACOTOMY/LOBECTOMY  1999   carcinoid tumor  . TOTAL VAGINAL HYSTERECTOMY    . VARICOSE VEIN SURGERY      There were no vitals filed for this visit.  Subjective Assessment - 07/07/17 1329    Subjective  Patient reports she had a seizure since the previous visits. Patient states she went to  see her cardiologist who started her on a new medication which she does not remember the name.     Patient is accompained by:  -- Daughter    Pertinent History  Previously seen for balance disorders and for neck pain over the past year.     Limitations  Walking;Sitting    How long can you walk comfortably?  3 min     Diagnostic tests  None    Patient Stated Goals  To be able to walk without an walker.     Currently in Pain?  No/denies    Pain Onset  1 to 4 weeks ago        TREATMENT  Therapeutic Exercise: Nustep level 4 for 6 min to improve LE strength with use of exercises Sit to stands - x 10 with therapist assistance  Standing on airex beam - x 20sec  Side stepping on airex beam - x 8 ~71ft Step ups with 3" step - x5 B   Patient reports fatigue at end of the session   PT Education - 07/07/17 1344    Education provided  Yes    Education Details  form/technique with exercise    Person(s) Educated  Patient    Methods  Explanation;Demonstration    Comprehension  Verbalized understanding;Returned demonstration          PT Long Term Goals - 05/12/17 1345      PT LONG TERM GOAL #1   Title  Patient will be independent with HEP to continue benefits of therapy after discharge.    Baseline  Requires moderate cueing with performance of exercise    Time  8    Period  Weeks    Status  On-going    Target Date  07/07/17      PT LONG TERM GOAL #2   Title  Patient will improve TUG to under 12 seconds to indicate signficant improvement in fall risk.     Baseline  TUG: 24sec; 03/26/2017: TUG: 22sec; 05/12/17: 21.7 sec    Time  8    Period  Weeks    Status  On-going    Target Date  07/07/17      PT LONG TERM GOAL #3   Title  Patient will improve 41minWT to over 1074ft to indicate functional improvement in walking tolerance and greater ability to walk in stores before requiring a sitting rest break    Baseline  463ft; 03/26/2017: 54ft; 05/12/17: 471ft (primary deficit is DOE)     Time  8    Period  Weeks    Status  On-going    Target Date  07/07/17      PT LONG TERM GOAL #4   Title  Patient will demonstrate ability to stand for over 2 min to allow for improved ability to perform household chores at home.     Baseline  able to stand for 50sec before requiring a sitting rest break; 03/26/2017: 60 sec before requiring a break; 05/12/17: 60 seconds per pt subjective report    Time  8    Period  Weeks    Status  On-going    Target Date  07/07/17      PT LONG TERM GOAL #5   Title  Pt will decrease 5TSTS to <12 seconds from a chair at 19in in height in order to demonstrate clinically significant improvement in LE strength    Baseline  5xSTS: 14 sec from 22" height chair; 03/26/2017: 12.5sec; 05/12/17: 13.4 sec    Time  8    Period  Weeks    Status  On-going    Target Date  07/07/17            Plan - 07/07/17 1345    Clinical Impression Statement  Patient demonstrates increased fatigue at the end of the session indicating decreased muscular endurance and strength with exercises. Patient fatigues easily and requires frequent rest breaks throughout session further indicating decreased muscular and cardiovascular endurance. Patient will benefit from further skilled therapy to return to prior level of function.     Rehab Potential  Fair    Clinical Impairments Affecting Rehab Potential  Positive: motivation; Negative: age, chronicity, bilateral knee pain    PT Frequency  2x / week    PT Duration  8 weeks    PT Treatment/Interventions  ADLs/Self Care Home Management;Aquatic Therapy;Canalith Repostioning;Cryotherapy;Electrical Stimulation;Iontophoresis 4mg /ml Dexamethasone;Moist Heat;Traction;Ultrasound;DME Instruction;Gait training;Stair training;Functional mobility training;Therapeutic activities;Therapeutic exercise;Balance training;Neuromuscular re-education;Patient/family education;Wheelchair mobility training;Manual techniques;Passive range of motion;Vestibular    PT  Next Visit Plan  Progress strengthening, reinforce HEP    PT Home Exercise Plan  Cervical retraction    Consulted and Agree with Plan of Care  Patient;Family member/caregiver    Family  Member Consulted  Daughter       Patient will benefit from skilled therapeutic intervention in order to improve the following deficits and impairments:  Abnormal gait, Decreased strength, Obesity, Decreased activity tolerance, Decreased endurance, Difficulty walking, Decreased balance, Decreased mobility, Decreased coordination, Increased muscle spasms, Postural dysfunction, Hypomobility, Decreased range of motion  Visit Diagnosis: Muscle weakness (generalized)  Difficulty in walking, not elsewhere classified  Unsteadiness on feet     Problem List Patient Active Problem List   Diagnosis Date Noted  . Localized edema 02/13/2017  . Hemorrhoids 05/17/2016  . Obesity (BMI 30-39.9) 04/26/2016  . Multiple lung nodules 04/19/2016  . Chest pain 04/17/2016  . Partial symptomatic epilepsy with complex partial seizures, not intractable, without status epilepticus ([place]) 06/30/2015  . Bronchiectasis without complication ([place]) 97/35/3299  . Imbalance 11/08/2014  . Essential (primary) hypertension 11/08/2014  . Personal history of malignant neoplasm of breast 11/08/2014  . History of malignant carcinoid tumor of bronchus and lung 11/08/2014  . H/O peptic ulcer 11/08/2014  . Asthma, mild intermittent 11/08/2014  . PE (pulmonary embolism) 11/08/2014  . Gonalgia 11/08/2014  . Leg varices 11/08/2014    Blythe Stanford, PT DPT 07/07/2017, 1:53 PM  [facility] [facility] [address], [place], [address] Phone: [phone]   Fax:  [phone]  Name: TAI SYFERT MRN: [id number] Date of Birth: Nov 13, 1924

## 2017-07-14 ENCOUNTER — Ambulatory Visit: Payer: Medicare Other

## 2017-07-14 DIAGNOSIS — M6281 Muscle weakness (generalized): Secondary | ICD-10-CM | POA: Diagnosis not present

## 2017-07-14 DIAGNOSIS — R2681 Unsteadiness on feet: Secondary | ICD-10-CM | POA: Diagnosis not present

## 2017-07-14 DIAGNOSIS — R262 Difficulty in walking, not elsewhere classified: Secondary | ICD-10-CM

## 2017-07-14 NOTE — Therapy (Signed)
[facility] [address], [place], [address] Phone: [phone]   Fax:  [phone]  Physical Therapy Treatment  Patient Details  Name: Rebecca Wolf MRN: [id number] Date of Birth: 03-Feb-1925 Referring Provider: Dr. Sharlet Salina   Encounter Date: 07/14/2017  PT End of Session - 07/14/17 1327    Visit Number  19    Number of Visits  36    Date for PT Re-Evaluation  08/25/17    PT Start Time  1302    PT Stop Time  1345    PT Time Calculation (min)  43 min    Equipment Utilized During Treatment  Gait belt    Activity Tolerance  Patient tolerated treatment well    Behavior During Therapy  [facility] for tasks assessed/performed       Past Medical History:  Diagnosis Date  . Asthma   . Breast cancer ([place]) 2003   LT LUMPECTOMY  . Breast cancer ([place]) 2004   LT LUMPECTOMY  . 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  . GERD (gastroesophageal reflux disease)   . Hemorrhoids   . Hypertension   . Personal history of chemotherapy 2004   BREAST CA  . Personal history of radiation therapy 2003   BREAST CA  . Personal history of radiation therapy 2004   BREAST CA  . Polyp of colon 2002   bleeding polyp of right ascending colon  . PUD (peptic ulcer disease)   . Seizures ([place])    epilepsy well controlled    Past Surgical History:  Procedure Laterality Date  . BREAST EXCISIONAL BIOPSY Left 2003   positive  . BREAST EXCISIONAL BIOPSY Left 2004   positive  . BREAST LUMPECTOMY Left 2003   BREAST CA  . BREAST LUMPECTOMY Left 2004   BREAST CA  . HEMICOLECTOMY Right    bowel obstruction  . HEMORRHOID SURGERY    . THORACOTOMY/LOBECTOMY  1999   carcinoid tumor  . TOTAL VAGINAL HYSTERECTOMY    . VARICOSE VEIN SURGERY      There were no vitals filed for this visit.  Subjective Assessment - 07/14/17 1309    Subjective  Patient reports no major changes since the previous visitation sesssion. Patient states  she would "be in a wheelchair" without going to PT.     Patient is accompained by:  -- Daughter    Pertinent History  Previously seen for balance disorders and for neck pain over the past year.     Limitations  Walking;Sitting    How long can you walk comfortably?  3 min     Diagnostic tests  None    Patient Stated Goals  To be able to walk without an walker.     Currently in Pain?  No/denies    Pain Onset  1 to 4 weeks ago       TREATMENT:  Therapeutic Exercise: Standing hip abduction -- x 10 B Standing heel raises with UE support -- x 20  Nustep in sitting -- level 4 to improve strength in LE's 6 min Ambulation with focus on improving speed of performance -- 43ft Sit to stands -- 3 x 5 with use of UE support Stand up and walking 10ft -- x 1  Patient demonstrates increased fatigue at end of the session    PT Education - 07/14/17 1319    Education provided  Yes    Education Details  form/technique with exercise    Person(s)  Educated  Patient    Methods  Explanation;Demonstration    Comprehension  Verbalized understanding;Returned demonstration          PT Long Term Goals - 07/14/17 1337      PT LONG TERM GOAL #1   Title  Patient will be independent with HEP to continue benefits of therapy after discharge.    Baseline  Requires moderate cueing with performance of exercise    Time  8    Period  Weeks    Status  On-going      PT LONG TERM GOAL #2   Title  Patient will improve TUG to under 12 seconds to indicate signficant improvement in fall risk.     Baseline  TUG: 24sec; 03/26/2017: TUG: 22sec; 05/12/17: 21.7 sec; 05/14/2017: 19sec    Time  8    Period  Weeks    Status  On-going      PT LONG TERM GOAL #3   Title  Patient will improve 21minWT to over 1073ft to indicate functional improvement in walking tolerance and greater ability to walk in stores before requiring a sitting rest break    Baseline  453ft; 03/26/2017: 551ft; 05/12/17: 422ft (primary deficit is  DOE); 07/14/17: 467ft DOE    Time  8    Period  Weeks    Status  On-going      PT LONG TERM GOAL #4   Title  Patient will demonstrate ability to stand for over 2 min to allow for improved ability to perform household chores at home.     Baseline  able to stand for 50sec before requiring a sitting rest break; 03/26/2017: 60 sec before requiring a break; 05/12/17: 60 seconds per pt subjective report; 07/14/17: 30sec     Time  8    Period  Weeks    Status  On-going      PT LONG TERM GOAL #5   Title  Pt will decrease 5TSTS to <12 seconds from a chair at 19in in height in order to demonstrate clinically significant improvement in LE strength    Baseline  5xSTS: 14 sec from 22" height chair; 03/26/2017: 12.5sec; 05/12/17: 13.4 sec; 07/14/17: Deffered    Time  8    Period  Weeks    Status  On-going            Plan - 07/14/17 1350    Clinical Impression Statement  Patient demonstrates improvement in long terms goals for strength and performance of activities in short distances; however demonstrates worsening in endurance based outcome measures such as 56min walk and endurance stance time. Patient defers performance of 5xSTS secondary to being worried about her previous seizure. Patient demonstrates poor endurance requiring frequent sitting rest breaks throughout the enitirity of session. Patient will benefit from further skilled therapy to return to prior level of function.     Rehab Potential  Fair    Clinical Impairments Affecting Rehab Potential  Positive: motivation; Negative: age, chronicity, bilateral knee pain    PT Frequency  2x / week    PT Duration  8 weeks    PT Treatment/Interventions  ADLs/Self Care Home Management;Aquatic Therapy;Canalith Repostioning;Cryotherapy;Electrical Stimulation;Iontophoresis 4mg /ml Dexamethasone;Moist Heat;Traction;Ultrasound;DME Instruction;Gait training;Stair training;Functional mobility training;Therapeutic activities;Therapeutic exercise;Balance  training;Neuromuscular re-education;Patient/family education;Wheelchair mobility training;Manual techniques;Passive range of motion;Vestibular    PT Next Visit Plan  Progress strengthening, reinforce HEP    PT Home Exercise Plan  Cervical retraction    Consulted and Agree with Plan of Care  Patient;Family member/caregiver  Family Member Consulted  Daughter       Patient will benefit from skilled therapeutic intervention in order to improve the following deficits and impairments:  Abnormal gait, Decreased strength, Obesity, Decreased activity tolerance, Decreased endurance, Difficulty walking, Decreased balance, Decreased mobility, Decreased coordination, Increased muscle spasms, Postural dysfunction, Hypomobility, Decreased range of motion  Visit Diagnosis: Muscle weakness (generalized)  Difficulty in walking, not elsewhere classified  Unsteadiness on feet     Problem List Patient Active Problem List   Diagnosis Date Noted  . Localized edema 02/13/2017  . Hemorrhoids 05/17/2016  . Obesity (BMI 30-39.9) 04/26/2016  . Multiple lung nodules 04/19/2016  . Chest pain 04/17/2016  . Partial symptomatic epilepsy with complex partial seizures, not intractable, without status epilepticus ([place]) 06/30/2015  . Bronchiectasis without complication ([place]) 34/35/6861  . Imbalance 11/08/2014  . Essential (primary) hypertension 11/08/2014  . Personal history of malignant neoplasm of breast 11/08/2014  . History of malignant carcinoid tumor of bronchus and lung 11/08/2014  . H/O peptic ulcer 11/08/2014  . Asthma, mild intermittent 11/08/2014  . PE (pulmonary embolism) 11/08/2014  . Gonalgia 11/08/2014  . Leg varices 11/08/2014    Blythe Stanford, PT DPT 07/14/2017, 1:56 PM  [facility] [address], [place], [address] Phone: [phone]   Fax:  [phone]  Name: Rebecca Wolf MRN: [id number] Date of Birth:  09-26-1924

## 2017-07-14 NOTE — Addendum Note (Signed)
Addended by: Blain Pais on: 07/14/2017 02:04 PM   Modules accepted: Orders

## 2017-07-21 ENCOUNTER — Ambulatory Visit: Payer: Medicare Other

## 2017-07-21 DIAGNOSIS — R262 Difficulty in walking, not elsewhere classified: Secondary | ICD-10-CM | POA: Diagnosis not present

## 2017-07-21 DIAGNOSIS — R2681 Unsteadiness on feet: Secondary | ICD-10-CM

## 2017-07-21 DIAGNOSIS — M6281 Muscle weakness (generalized): Secondary | ICD-10-CM | POA: Diagnosis not present

## 2017-07-21 NOTE — Therapy (Signed)
[facility] [address], [place], [address] Phone: [phone]   Fax:  [phone]  Physical Therapy Treatment  Patient Details  Name: Rebecca Wolf MRN: [id number] Date of Birth: 02-10-1925 Referring Provider: Dr. Sharlet Salina   Encounter Date: 07/21/2017  PT End of Session - 07/21/17 1340    Visit Number  20    Number of Visits  36    Date for PT Re-Evaluation  08/25/17    PT Start Time  9735    PT Stop Time  1345    PT Time Calculation (min)  40 min    Equipment Utilized During Treatment  Gait belt    Activity Tolerance  Patient tolerated treatment well    Behavior During Therapy  [facility] for tasks assessed/performed       Past Medical History:  Diagnosis Date  . Asthma   . Breast cancer ([place]) 2003   LT LUMPECTOMY  . Breast cancer ([place]) 2004   LT LUMPECTOMY  . 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  . GERD (gastroesophageal reflux disease)   . Hemorrhoids   . Hypertension   . Personal history of chemotherapy 2004   BREAST CA  . Personal history of radiation therapy 2003   BREAST CA  . Personal history of radiation therapy 2004   BREAST CA  . Polyp of colon 2002   bleeding polyp of right ascending colon  . PUD (peptic ulcer disease)   . Seizures ([place])    epilepsy well controlled    Past Surgical History:  Procedure Laterality Date  . BREAST EXCISIONAL BIOPSY Left 2003   positive  . BREAST EXCISIONAL BIOPSY Left 2004   positive  . BREAST LUMPECTOMY Left 2003   BREAST CA  . BREAST LUMPECTOMY Left 2004   BREAST CA  . HEMICOLECTOMY Right    bowel obstruction  . HEMORRHOID SURGERY    . THORACOTOMY/LOBECTOMY  1999   carcinoid tumor  . TOTAL VAGINAL HYSTERECTOMY    . VARICOSE VEIN SURGERY      There were no vitals filed for this visit.  Subjective Assessment - 07/21/17 1338    Subjective  Patient reports she has not performed her exercises. Patient states she needs physical  therapy to stay strong and walk.     Patient is accompained by:  -- Daughter    Pertinent History  Previously seen for balance disorders and for neck pain over the past year.     Limitations  Walking;Sitting    How long can you walk comfortably?  3 min     Diagnostic tests  None    Patient Stated Goals  To be able to walk without an walker.     Currently in Pain?  No/denies    Pain Onset  1 to 4 weeks ago        TREATMENT:   Therapeutic Exercise: Nustep in sitting -- level 5 to improve strength in LE's 6 min Heel raises in standing - with UE support x 20 Standing feet together balance - x10sec, x15sec, x25sec, x10sec Side stepping with agility ladder and min-modA with PT support - x10 Ambulation with focus on improving speed of performance -- 249ft without AD Squatting with UE support - x20    Patient demonstrates increased fatigue at end of the session     PT Long Term Goals - 07/14/17 1337      PT LONG TERM GOAL #1   Title  Patient will be independent with HEP to continue benefits of therapy after discharge.    Baseline  Requires moderate cueing with performance of exercise    Time  8    Period  Weeks    Status  On-going      PT LONG TERM GOAL #2   Title  Patient will improve TUG to under 12 seconds to indicate signficant improvement in fall risk.     Baseline  TUG: 24sec; 03/26/2017: TUG: 22sec; 05/12/17: 21.7 sec; 05/14/2017: 19sec    Time  8    Period  Weeks    Status  On-going      PT LONG TERM GOAL #3   Title  Patient will improve 28minWT to over 1056ft to indicate functional improvement in walking tolerance and greater ability to walk in stores before requiring a sitting rest break    Baseline  426ft; 03/26/2017: 580ft; 05/12/17: 46ft (primary deficit is DOE); 07/14/17: 459ft DOE    Time  8    Period  Weeks    Status  On-going      PT LONG TERM GOAL #4   Title  Patient will demonstrate ability to stand for over 2 min to allow for improved ability to perform  household chores at home.     Baseline  able to stand for 50sec before requiring a sitting rest break; 03/26/2017: 60 sec before requiring a break; 05/12/17: 60 seconds per pt subjective report; 07/14/17: 30sec     Time  8    Period  Weeks    Status  On-going      PT LONG TERM GOAL #5   Title  Pt will decrease 5TSTS to <12 seconds from a chair at 19in in height in order to demonstrate clinically significant improvement in LE strength    Baseline  5xSTS: 14 sec from 22" height chair; 03/26/2017: 12.5sec; 05/12/17: 13.4 sec; 07/14/17: Deffered    Time  8    Period  Weeks    Status  On-going            Plan - 07/21/17 1340    Clinical Impression Statement  Patient demonstrates improvement with exercise performance today with ability to perform greater amount of exercises with shorter rest breaks. Although patient demonstrates improvement she continues to demonstrates decreased ability to walk for longer periods of time. Patient will benefit from further skilled therapy to return to prior level of function.     Rehab Potential  Fair    Clinical Impairments Affecting Rehab Potential  Positive: motivation; Negative: age, chronicity, bilateral knee pain    PT Frequency  2x / week    PT Duration  8 weeks    PT Treatment/Interventions  ADLs/Self Care Home Management;Aquatic Therapy;Canalith Repostioning;Cryotherapy;Electrical Stimulation;Iontophoresis 4mg /ml Dexamethasone;Moist Heat;Traction;Ultrasound;DME Instruction;Gait training;Stair training;Functional mobility training;Therapeutic activities;Therapeutic exercise;Balance training;Neuromuscular re-education;Patient/family education;Wheelchair mobility training;Manual techniques;Passive range of motion;Vestibular    PT Next Visit Plan  Progress strengthening, reinforce HEP    PT Home Exercise Plan  Cervical retraction    Consulted and Agree with Plan of Care  Patient;Family member/caregiver    Family Member Consulted  Daughter       Patient  will benefit from skilled therapeutic intervention in order to improve the following deficits and impairments:  Abnormal gait, Decreased strength, Obesity, Decreased activity tolerance, Decreased endurance, Difficulty walking, Decreased balance, Decreased mobility, Decreased coordination, Increased muscle spasms, Postural dysfunction, Hypomobility, Decreased range of motion  Visit Diagnosis: Muscle weakness (generalized)  Difficulty in walking, not elsewhere classified  Unsteadiness on feet     Problem List Patient Active Problem List   Diagnosis Date Noted  . Localized edema 02/13/2017  . Hemorrhoids 05/17/2016  . Obesity (BMI 30-39.9) 04/26/2016  . Multiple lung nodules 04/19/2016  . Chest pain 04/17/2016  . Partial symptomatic epilepsy with complex partial seizures, not intractable, without status epilepticus ([place]) 06/30/2015  . Bronchiectasis without complication ([place]) 01/00/7121  . Imbalance 11/08/2014  . Essential (primary) hypertension 11/08/2014  . Personal history of malignant neoplasm of breast 11/08/2014  . History of malignant carcinoid tumor of bronchus and lung 11/08/2014  . H/O peptic ulcer 11/08/2014  . Asthma, mild intermittent 11/08/2014  . PE (pulmonary embolism) 11/08/2014  . Gonalgia 11/08/2014  . Leg varices 11/08/2014    Blythe Stanford, PT DPT 07/21/2017, 1:53 PM  [facility] [address], [place], [address] Phone: [phone]   Fax:  [phone]  Name: Rebecca Wolf MRN: [id number] Date of Birth: [birth year]

## 2017-07-28 ENCOUNTER — Ambulatory Visit: Payer: Medicare Other

## 2017-07-28 DIAGNOSIS — R2681 Unsteadiness on feet: Secondary | ICD-10-CM

## 2017-07-28 DIAGNOSIS — M6281 Muscle weakness (generalized): Secondary | ICD-10-CM

## 2017-07-28 DIAGNOSIS — R262 Difficulty in walking, not elsewhere classified: Secondary | ICD-10-CM | POA: Diagnosis not present

## 2017-07-28 NOTE — Therapy (Signed)
[facility] [address] [place], [address] Phone: [phone]   Fax:  [phone]  Physical Therapy Treatment  Patient Details  Name: Rebecca Wolf MRN: [id number] Date of Birth: [birth year] Referring Provider: Dr. Sharlet Salina   Encounter Date: 07/28/2017  PT End of Session - 07/28/17 1316    Visit Number  21    Number of Visits  36    Date for PT Re-Evaluation  08/25/17    PT Start Time  6962    PT Stop Time  1345    PT Time Calculation (min)  39 min    Equipment Utilized During Treatment  Gait belt    Activity Tolerance  Patient tolerated treatment well    Behavior During Therapy  [facility] for tasks assessed/performed       Past Medical History:  Diagnosis Date  . Asthma   . Breast cancer ([place]) 2003   LT LUMPECTOMY  . Breast cancer ([place]) 2004   LT LUMPECTOMY  . 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  . GERD (gastroesophageal reflux disease)   . Hemorrhoids   . Hypertension   . Personal history of chemotherapy 2004   BREAST CA  . Personal history of radiation therapy 2003   BREAST CA  . Personal history of radiation therapy 2004   BREAST CA  . Polyp of colon 2002   bleeding polyp of right ascending colon  . PUD (peptic ulcer disease)   . Seizures ([place])    epilepsy well controlled    Past Surgical History:  Procedure Laterality Date  . BREAST EXCISIONAL BIOPSY Left 2003   positive  . BREAST EXCISIONAL BIOPSY Left 2004   positive  . BREAST LUMPECTOMY Left 2003   BREAST CA  . BREAST LUMPECTOMY Left 2004   BREAST CA  . HEMICOLECTOMY Right    bowel obstruction  . HEMORRHOID SURGERY    . THORACOTOMY/LOBECTOMY  1999   carcinoid tumor  . TOTAL VAGINAL HYSTERECTOMY    . VARICOSE VEIN SURGERY      There were no vitals filed for this visit.  Subjective Assessment - 07/28/17 1313    Subjective  Patient reports she continues to not perform her exercises but states physical therapy is  keeping her from being completely immobile.    Patient is accompained by:  -- Daughter    Pertinent History  Previously seen for balance disorders and for neck pain over the past year.     Limitations  Walking;Sitting    How long can you walk comfortably?  3 min     Diagnostic tests  None    Patient Stated Goals  To be able to walk without an walker.     Currently in Pain?  No/denies    Pain Onset  1 to 4 weeks ago        TREATMENT:   Therapeutic Exercise: Nustep in sitting -- level 5 to improve strength in LE's 6 min Heel raises in standing - with UE support 2x 20; single leg 2 x 5  Standing hip abduction with slight extension - 2 x 15 Side stepping with UE support - 2 x 10 ft Ambulation with focus on improving speed of performance -- x172ft x131ft x24ft without AD   Patient demonstrates increased fatigue at end of the session    PT Education - 07/28/17 1315    Education provided  Yes    Education Details  form/technique with exercise  Person(s) Educated  Patient    Methods  Explanation;Demonstration    Comprehension  Verbalized understanding;Returned demonstration          PT Long Term Goals - 07/14/17 1337      PT LONG TERM GOAL #1   Title  Patient will be independent with HEP to continue benefits of therapy after discharge.    Baseline  Requires moderate cueing with performance of exercise    Time  8    Period  Weeks    Status  On-going      PT LONG TERM GOAL #2   Title  Patient will improve TUG to under 12 seconds to indicate signficant improvement in fall risk.     Baseline  TUG: 24sec; 03/26/2017: TUG: 22sec; 05/12/17: 21.7 sec; 05/14/2017: 19sec    Time  8    Period  Weeks    Status  On-going      PT LONG TERM GOAL #3   Title  Patient will improve 97minWT to over 1044ft to indicate functional improvement in walking tolerance and greater ability to walk in stores before requiring a sitting rest break    Baseline  459ft; 03/26/2017: 542ft; 05/12/17: 470ft  (primary deficit is DOE); 07/14/17: 466ft DOE    Time  8    Period  Weeks    Status  On-going      PT LONG TERM GOAL #4   Title  Patient will demonstrate ability to stand for over 2 min to allow for improved ability to perform household chores at home.     Baseline  able to stand for 50sec before requiring a sitting rest break; 03/26/2017: 60 sec before requiring a break; 05/12/17: 60 seconds per pt subjective report; 07/14/17: 30sec     Time  8    Period  Weeks    Status  On-going      PT LONG TERM GOAL #5   Title  Pt will decrease 5TSTS to <12 seconds from a chair at 19in in height in order to demonstrate clinically significant improvement in LE strength    Baseline  5xSTS: 14 sec from 22" height chair; 03/26/2017: 12.5sec; 05/12/17: 13.4 sec; 07/14/17: Deffered    Time  8    Period  Weeks    Status  On-going            Plan - 07/28/17 1324    Clinical Impression Statement  Continued to focus on balance and hip strengthening as patient continues to demonstrate difficulty waling and increased fall risk with standing. Patient demonstrates improvement in endurance today with ability to perform greater amount of exercises before onset of fatigue. Improvement in endurance is inconistent between sessions and requires cueing on rest breaks and exercise performance. Patient will benefit from further skilled therapy to return to prior level of function.     Rehab Potential  Fair    Clinical Impairments Affecting Rehab Potential  Positive: motivation; Negative: age, chronicity, bilateral knee pain    PT Frequency  2x / week    PT Duration  8 weeks    PT Treatment/Interventions  ADLs/Self Care Home Management;Aquatic Therapy;Canalith Repostioning;Cryotherapy;Electrical Stimulation;Iontophoresis 4mg /ml Dexamethasone;Moist Heat;Traction;Ultrasound;DME Instruction;Gait training;Stair training;Functional mobility training;Therapeutic activities;Therapeutic exercise;Balance training;Neuromuscular  re-education;Patient/family education;Wheelchair mobility training;Manual techniques;Passive range of motion;Vestibular    PT Next Visit Plan  Progress strengthening, reinforce HEP    PT Home Exercise Plan  Cervical retraction    Consulted and Agree with Plan of Care  Patient;Family member/caregiver    Family Member Consulted  Daughter       Patient will benefit from skilled therapeutic intervention in order to improve the following deficits and impairments:  Abnormal gait, Decreased strength, Obesity, Decreased activity tolerance, Decreased endurance, Difficulty walking, Decreased balance, Decreased mobility, Decreased coordination, Increased muscle spasms, Postural dysfunction, Hypomobility, Decreased range of motion  Visit Diagnosis: Muscle weakness (generalized)  Difficulty in walking, not elsewhere classified  Unsteadiness on feet     Problem List Patient Active Problem List   Diagnosis Date Noted  . Localized edema 02/13/2017  . Hemorrhoids 05/17/2016  . Obesity (BMI 30-39.9) 04/26/2016  . Multiple lung nodules 04/19/2016  . Chest pain 04/17/2016  . Partial symptomatic epilepsy with complex partial seizures, not intractable, without status epilepticus ([place]) 06/30/2015  . Bronchiectasis without complication ([place]) 74/25/9563  . Imbalance 11/08/2014  . Essential (primary) hypertension 11/08/2014  . Personal history of malignant neoplasm of breast 11/08/2014  . History of malignant carcinoid tumor of bronchus and lung 11/08/2014  . H/O peptic ulcer 11/08/2014  . Asthma, mild intermittent 11/08/2014  . PE (pulmonary embolism) 11/08/2014  . Gonalgia 11/08/2014  . Leg varices 11/08/2014    Blythe Stanford, PT DPT 07/28/2017, 1:56 PM  [facility] [address] [place], [address] Phone: [phone]   Fax:  [phone]  Name: Rebecca Wolf MRN: [id number] Date of Birth: March 20, 1925

## 2017-07-31 DIAGNOSIS — G40209 Localization-related (focal) (partial) symptomatic epilepsy and epileptic syndromes with complex partial seizures, not intractable, without status epilepticus: Secondary | ICD-10-CM | POA: Diagnosis not present

## 2017-08-06 ENCOUNTER — Ambulatory Visit: Payer: Medicare Other | Attending: Physical Medicine and Rehabilitation

## 2017-08-06 DIAGNOSIS — M6281 Muscle weakness (generalized): Secondary | ICD-10-CM | POA: Insufficient documentation

## 2017-08-06 DIAGNOSIS — R2681 Unsteadiness on feet: Secondary | ICD-10-CM | POA: Insufficient documentation

## 2017-08-06 DIAGNOSIS — R262 Difficulty in walking, not elsewhere classified: Secondary | ICD-10-CM | POA: Insufficient documentation

## 2017-08-12 ENCOUNTER — Ambulatory Visit: Payer: Medicare Other

## 2017-08-19 ENCOUNTER — Ambulatory Visit: Payer: Medicare Other

## 2017-08-19 DIAGNOSIS — M6281 Muscle weakness (generalized): Secondary | ICD-10-CM

## 2017-08-19 DIAGNOSIS — R2681 Unsteadiness on feet: Secondary | ICD-10-CM | POA: Diagnosis not present

## 2017-08-19 DIAGNOSIS — R262 Difficulty in walking, not elsewhere classified: Secondary | ICD-10-CM

## 2017-08-19 NOTE — Therapy (Signed)
[facility] [address] [place], [address] Phone: [phone]   Fax:  [phone]  Physical Therapy Treatment  Patient Details  Name: Rebecca Wolf MRN: [id number] Date of Birth: 04-Jul-1924 Referring Provider: Dr. Sharlet Salina   Encounter Date: 08/19/2017  PT End of Session - 08/19/17 1347    Visit Number  22    Number of Visits  36    Date for PT Re-Evaluation  08/25/17    PT Start Time  9326    PT Stop Time  1345    PT Time Calculation (min)  42 min    Equipment Utilized During Treatment  Gait belt    Activity Tolerance  Patient tolerated treatment well    Behavior During Therapy  [facility] for tasks assessed/performed       Past Medical History:  Diagnosis Date  . Asthma   . Breast cancer ([place]) 2003   LT LUMPECTOMY  . Breast cancer ([place]) 2004   LT LUMPECTOMY  . 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  . GERD (gastroesophageal reflux disease)   . Hemorrhoids   . Hypertension   . Personal history of chemotherapy 2004   BREAST CA  . Personal history of radiation therapy 2003   BREAST CA  . Personal history of radiation therapy 2004   BREAST CA  . Polyp of colon 2002   bleeding polyp of right ascending colon  . PUD (peptic ulcer disease)   . Seizures ([place])    epilepsy well controlled    Past Surgical History:  Procedure Laterality Date  . BREAST EXCISIONAL BIOPSY Left 2003   positive  . BREAST EXCISIONAL BIOPSY Left 2004   positive  . BREAST LUMPECTOMY Left 2003   BREAST CA  . BREAST LUMPECTOMY Left 2004   BREAST CA  . HEMICOLECTOMY Right    bowel obstruction  . HEMORRHOID SURGERY    . THORACOTOMY/LOBECTOMY  1999   carcinoid tumor  . TOTAL VAGINAL HYSTERECTOMY    . VARICOSE VEIN SURGERY      There were no vitals filed for this visit.  Subjective Assessment - 08/19/17 1307    Subjective  Pt states she [place] since the previous sessoin. Pt reports  she is feeling well.    Patient is accompained by:  -- Daughter    Pertinent History  Previously seen for balance disorders and for neck pain over the past year. Pt reports R knee feels sore today but is not painful.    Limitations  Walking;Sitting    How long can you walk comfortably?  3 min     Diagnostic tests  None    Patient Stated Goals  To be able to walk without an walker.     Currently in Pain?  No/denies       Treatment  Therapeutic Exercise  Nustep in sitting -- level 5 x36min to improve strength in LE's  Heel raises in standing with UE support --1x20 to increase endurance of the plantarflexors Single leg heel raises with UE support -- 2x10 to increase strength and endurance of the plantarflexors Airex beam side stepping with UE support- 8x55f to improve dynamic balance Step ups onto 4" step - 2x15 to improve LE strength Narrow stance on airex beam - 4x20 sec to increase static balance Ambulation with focus on improve speed of performance - x165 ft without AD and with therapist assistance   Patient demonstrates increased  fatigue at end of the session.    PT Education - 08/19/17 1347    Education provided  Yes    Education Details  form/technique with exercise    Person(s) Educated  Patient    Methods  Explanation;Demonstration    Comprehension  Verbalized understanding;Returned demonstration          PT Long Term Goals - 07/14/17 1337      PT LONG TERM GOAL #1   Title  Patient will be independent with HEP to continue benefits of therapy after discharge.    Baseline  Requires moderate cueing with performance of exercise    Time  8    Period  Weeks    Status  On-going      PT LONG TERM GOAL #2   Title  Patient will improve TUG to under 12 seconds to indicate signficant improvement in fall risk.     Baseline  TUG: 24sec; 03/26/2017: TUG: 22sec; 05/12/17: 21.7 sec; 05/14/2017: 19sec    Time  8    Period  Weeks    Status  On-going      PT LONG TERM GOAL #3    Title  Patient will improve 76minWT to over 1051ft to indicate functional improvement in walking tolerance and greater ability to walk in stores before requiring a sitting rest break    Baseline  482ft; 03/26/2017: 524ft; 05/12/17: 442ft (primary deficit is DOE); 07/14/17: 430ft DOE    Time  8    Period  Weeks    Status  On-going      PT LONG TERM GOAL #4   Title  Patient will demonstrate ability to stand for over 2 min to allow for improved ability to perform household chores at home.     Baseline  able to stand for 50sec before requiring a sitting rest break; 03/26/2017: 60 sec before requiring a break; 05/12/17: 60 seconds per pt subjective report; 07/14/17: 30sec     Time  8    Period  Weeks    Status  On-going      PT LONG TERM GOAL #5   Title  Pt will decrease 5TSTS to <12 seconds from a chair at 19in in height in order to demonstrate clinically significant improvement in LE strength    Baseline  5xSTS: 14 sec from 22" height chair; 03/26/2017: 12.5sec; 05/12/17: 13.4 sec; 07/14/17: Deffered    Time  8    Period  Weeks    Status  On-going            Plan - 08/19/17 1348    Clinical Impression Statement  Pt demonstrates improvement with single leg heel raises with ability to perform more repititions with decreased fatigue. Although pt demonstrates improvement, pt continues to demonstrate lack of confidence and decreased stability with narrow stance on airex pad, indicating decreased static balance. Pt will benefit from further skilled therapy to return to prior levels of function.    Rehab Potential  Fair    Clinical Impairments Affecting Rehab Potential  Positive: motivation; Negative: age, chronicity, bilateral knee pain    PT Frequency  2x / week    PT Duration  8 weeks    PT Treatment/Interventions  ADLs/Self Care Home Management;Aquatic Therapy;Canalith Repostioning;Cryotherapy;Electrical Stimulation;Iontophoresis 4mg /ml Dexamethasone;Moist Heat;Traction;Ultrasound;DME  Instruction;Gait training;Stair training;Functional mobility training;Therapeutic activities;Therapeutic exercise;Balance training;Neuromuscular re-education;Patient/family education;Wheelchair mobility training;Manual techniques;Passive range of motion;Vestibular    PT Next Visit Plan  Progress strengthening, reinforce HEP    PT Home Exercise Plan  Cervical retraction  Consulted and Agree with Plan of Care  Patient;Family member/caregiver    Family Member Consulted  Daughter       Patient will benefit from skilled therapeutic intervention in order to improve the following deficits and impairments:  Abnormal gait, Decreased strength, Obesity, Decreased activity tolerance, Decreased endurance, Difficulty walking, Decreased balance, Decreased mobility, Decreased coordination, Increased muscle spasms, Postural dysfunction, Hypomobility, Decreased range of motion  Visit Diagnosis: Muscle weakness (generalized)  Difficulty in walking, not elsewhere classified  Unsteadiness on feet     Problem List Patient Active Problem List   Diagnosis Date Noted  . Localized edema 02/13/2017  . Hemorrhoids 05/17/2016  . Obesity (BMI 30-39.9) 04/26/2016  . Multiple lung nodules 04/19/2016  . Chest pain 04/17/2016  . Partial symptomatic epilepsy with complex partial seizures, not intractable, without status epilepticus ([place]) 06/30/2015  . Bronchiectasis without complication ([place]) 81/15/7262  . Imbalance 11/08/2014  . Essential (primary) hypertension 11/08/2014  . Personal history of malignant neoplasm of breast 11/08/2014  . History of malignant carcinoid tumor of bronchus and lung 11/08/2014  . H/O peptic ulcer 11/08/2014  . Asthma, mild intermittent 11/08/2014  . PE (pulmonary embolism) 11/08/2014  . Gonalgia 11/08/2014  . Leg varices 11/08/2014    Ricard Dillon, SPT 08/19/2017, 1:49 PM  [facility] [address]  [place], [address] Phone: [phone]   Fax:  [phone]  Name: Rebecca Wolf MRN: [id number] Date of Birth: 1925-04-04

## 2017-08-26 ENCOUNTER — Ambulatory Visit: Payer: Medicare Other

## 2017-08-26 DIAGNOSIS — M6281 Muscle weakness (generalized): Secondary | ICD-10-CM | POA: Diagnosis not present

## 2017-08-26 DIAGNOSIS — R2681 Unsteadiness on feet: Secondary | ICD-10-CM | POA: Diagnosis not present

## 2017-08-26 DIAGNOSIS — R262 Difficulty in walking, not elsewhere classified: Secondary | ICD-10-CM | POA: Diagnosis not present

## 2017-08-26 NOTE — Therapy (Signed)
[facility] [address] [place], [address] Phone: [phone]   Fax:  [phone]  Physical Therapy Treatment  Patient Details  Name: Rebecca Wolf MRN: [id number] Date of Birth: 1925/03/26 Referring Provider: Dr. Sharlet Salina   Encounter Date: 08/26/2017  PT End of Session - 08/26/17 1356    Visit Number  23    Number of Visits  36    Date for PT Re-Evaluation  10/07/17    PT Start Time  4315    PT Stop Time  1345    PT Time Calculation (min)  42 min    Equipment Utilized During Treatment  Gait belt    Activity Tolerance  Patient tolerated treatment well    Behavior During Therapy  [facility] for tasks assessed/performed       Past Medical History:  Diagnosis Date  . Asthma   . Breast cancer ([place]) 2003   LT LUMPECTOMY  . Breast cancer ([place]) 2004   LT LUMPECTOMY  . 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  . GERD (gastroesophageal reflux disease)   . Hemorrhoids   . Hypertension   . Personal history of chemotherapy 2004   BREAST CA  . Personal history of radiation therapy 2003   BREAST CA  . Personal history of radiation therapy 2004   BREAST CA  . Polyp of colon 2002   bleeding polyp of right ascending colon  . PUD (peptic ulcer disease)   . Seizures ([place])    epilepsy well controlled    Past Surgical History:  Procedure Laterality Date  . BREAST EXCISIONAL BIOPSY Left 2003   positive  . BREAST EXCISIONAL BIOPSY Left 2004   positive  . BREAST LUMPECTOMY Left 2003   BREAST CA  . BREAST LUMPECTOMY Left 2004   BREAST CA  . HEMICOLECTOMY Right    bowel obstruction  . HEMORRHOID SURGERY    . THORACOTOMY/LOBECTOMY  1999   carcinoid tumor  . TOTAL VAGINAL HYSTERECTOMY    . VARICOSE VEIN SURGERY      There were no vitals filed for this visit.  Subjective Assessment - 08/26/17 1308    Subjective  Patient states she went walking around the [employer] for exercise. Patient reports  she feels coming to therapy has been helping with her walking.     Patient is accompained by:  -- Daughter    Pertinent History  Previously seen for balance disorders and for neck pain over the past year. Pt reports R knee feels sore today but is not painful.    Limitations  Walking;Sitting    How long can you walk comfortably?  3 min     Diagnostic tests  None    Patient Stated Goals  To be able to walk without an walker.     Currently in Pain?  No/denies        TREATMENT: Ambulation with focus on increasing speed and step length -- 365ft with therapist guarding Standing for time -- 2 x 40sec with therapist support Nustep with use of LE and UE use on Nustep -- x 6 min Sit to stand with UE support from PT -- 5 x 8  Sit to stand transfers with ambulation -- x 3 (~81ft each rep)  Patient demonstratres increased fatigue at the end of the session    PT Education - 08/26/17 1407    Education provided  Yes    Education Details  form/technique with exercise  Person(s) Educated  Patient    Methods  Explanation;Demonstration    Comprehension  Verbalized understanding;Returned demonstration          PT Long Term Goals - 08/26/17 1322      PT LONG TERM GOAL #1   Title  Patient will be independent with HEP to continue benefits of therapy after discharge.    Baseline  Requires moderate cueing with performance of exercise    Time  8    Period  Weeks    Status  On-going      PT LONG TERM GOAL #2   Title  Patient will improve TUG to under 12 seconds to indicate signficant improvement in fall risk.     Baseline  TUG: 24sec; 03/26/2017: TUG: 22sec; 05/12/17: 21.7 sec; 05/14/2017: 19sec; 08/26/2016: 14.75    Time  8    Period  Weeks    Status  On-going      PT LONG TERM GOAL #3   Title  Patient will improve 73minWT to over 1067ft to indicate functional improvement in walking tolerance and greater ability to walk in stores before requiring a sitting rest break    Baseline  467ft;  03/26/2017: 522ft; 05/12/17: 488ft (primary deficit is DOE); 07/14/17: 485ft DOE; 08/26/2017: 379ft    Time  8    Period  Weeks    Status  On-going      PT LONG TERM GOAL #4   Title  Patient will demonstrate ability to stand for over 2 min to allow for improved ability to perform household chores at home.     Baseline  able to stand for 50sec before requiring a sitting rest break; 03/26/2017: 60 sec before requiring a break; 05/12/17: 60 seconds per pt subjective report; 07/14/17: 30sec 08/26/2017: 5    Time  8    Period  Weeks    Status  On-going      PT LONG TERM GOAL #5   Title  Pt will decrease 5TSTS to <12 seconds from a chair at 19in in height in order to demonstrate clinically significant improvement in LE strength    Baseline  5xSTS: 14 sec from 22" height chair; 03/26/2017: 12.5sec; 05/12/17: 13.4 sec; 07/14/17: Deffered; 08/26/2017: 21sec from 21" height    Time  8    Period  Weeks    Status  On-going            Plan - 08/26/17 1408    Clinical Impression Statement  Patient is making progress towards long terms goals with improved ability to perform the TUG indicating decreased fall risk and increased ability to transfer from sitting to walking to the restroom. Although patient demonstrates improvement with ambulation she demonstrates decreased ability to perform 63min walk test indicating decreased cardiovascular and muscular endurance (patient required 3 sitting rest breaks of ~32min). Patient also demosntrates decreased sit to stand performance, however, was able to perform with decreased UE support compared to previous performance. Patient will benefit from further skilled therapy to return to prior level of function.     Rehab Potential  Fair    Clinical Impairments Affecting Rehab Potential  Positive: motivation; Negative: age, chronicity, bilateral knee pain    PT Frequency  2x / week    PT Duration  8 weeks    PT Treatment/Interventions  ADLs/Self Care Home Management;Aquatic  Therapy;Canalith Repostioning;Cryotherapy;Electrical Stimulation;Iontophoresis 4mg /ml Dexamethasone;Moist Heat;Traction;Ultrasound;DME Instruction;Gait training;Stair training;Functional mobility training;Therapeutic activities;Therapeutic exercise;Balance training;Neuromuscular re-education;Patient/family education;Wheelchair mobility training;Manual techniques;Passive range of motion;Vestibular    PT Next  Visit Plan  Progress strengthening, reinforce HEP    PT Home Exercise Plan  Cervical retraction    Consulted and Agree with Plan of Care  Patient;Family member/caregiver    Family Member Consulted  Daughter       Patient will benefit from skilled therapeutic intervention in order to improve the following deficits and impairments:  Abnormal gait, Decreased strength, Obesity, Decreased activity tolerance, Decreased endurance, Difficulty walking, Decreased balance, Decreased mobility, Decreased coordination, Increased muscle spasms, Postural dysfunction, Hypomobility, Decreased range of motion  Visit Diagnosis: Muscle weakness (generalized)  Difficulty in walking, not elsewhere classified  Unsteadiness on feet     Problem List Patient Active Problem List   Diagnosis Date Noted  . Localized edema 02/13/2017  . Hemorrhoids 05/17/2016  . Obesity (BMI 30-39.9) 04/26/2016  . Multiple lung nodules 04/19/2016  . Chest pain 04/17/2016  . Partial symptomatic epilepsy with complex partial seizures, not intractable, without status epilepticus ([place]) 06/30/2015  . Bronchiectasis without complication ([place]) 56/21/3086  . Imbalance 11/08/2014  . Essential (primary) hypertension 11/08/2014  . Personal history of malignant neoplasm of breast 11/08/2014  . History of malignant carcinoid tumor of bronchus and lung 11/08/2014  . H/O peptic ulcer 11/08/2014  . Asthma, mild intermittent 11/08/2014  . PE (pulmonary embolism) 11/08/2014  . Gonalgia 11/08/2014  . Leg varices 11/08/2014    Blythe Stanford, PT DPT 08/26/2017, 2:49 PM  [facility] [address] [place], [address] Phone: [phone]   Fax:  [phone]  Name: Rebecca Wolf MRN: [id number] Date of Birth: 02-Jun-1925

## 2017-09-02 ENCOUNTER — Ambulatory Visit: Payer: Medicare Other | Admitting: Podiatry

## 2017-09-02 ENCOUNTER — Ambulatory Visit: Payer: Medicare Other | Attending: Physical Medicine and Rehabilitation

## 2017-09-02 DIAGNOSIS — R262 Difficulty in walking, not elsewhere classified: Secondary | ICD-10-CM | POA: Insufficient documentation

## 2017-09-02 DIAGNOSIS — M6281 Muscle weakness (generalized): Secondary | ICD-10-CM | POA: Diagnosis not present

## 2017-09-02 DIAGNOSIS — R2681 Unsteadiness on feet: Secondary | ICD-10-CM | POA: Diagnosis not present

## 2017-09-02 NOTE — Therapy (Signed)
[facility] [address] [place], [address] Phone: [phone]   Fax:  [phone]  Physical Therapy Treatment  Patient Details  Name: Rebecca Wolf MRN: [id number] Date of Birth: Jul 08, 1924 Referring Provider: Dr. Sharlet Salina   Encounter Date: 09/02/2017  PT End of Session - 09/02/17 1409    Visit Number  24    Number of Visits  36    Date for PT Re-Evaluation  10/07/17    PT Start Time  1350    PT Stop Time  1430    PT Time Calculation (min)  40 min    Equipment Utilized During Treatment  Gait belt    Activity Tolerance  Patient tolerated treatment well    Behavior During Therapy  [facility] for tasks assessed/performed       Past Medical History:  Diagnosis Date  . Asthma   . Breast cancer ([place]) 2003   LT LUMPECTOMY  . Breast cancer ([place]) 2004   LT LUMPECTOMY  . 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  . GERD (gastroesophageal reflux disease)   . Hemorrhoids   . Hypertension   . Personal history of chemotherapy 2004   BREAST CA  . Personal history of radiation therapy 2003   BREAST CA  . Personal history of radiation therapy 2004   BREAST CA  . Polyp of colon 2002   bleeding polyp of right ascending colon  . PUD (peptic ulcer disease)   . Seizures ([place])    epilepsy well controlled    Past Surgical History:  Procedure Laterality Date  . BREAST EXCISIONAL BIOPSY Left 2003   positive  . BREAST EXCISIONAL BIOPSY Left 2004   positive  . BREAST LUMPECTOMY Left 2003   BREAST CA  . BREAST LUMPECTOMY Left 2004   BREAST CA  . HEMICOLECTOMY Right    bowel obstruction  . HEMORRHOID SURGERY    . THORACOTOMY/LOBECTOMY  1999   carcinoid tumor  . TOTAL VAGINAL HYSTERECTOMY    . VARICOSE VEIN SURGERY      There were no vitals filed for this visit.  Subjective Assessment - 09/02/17 1401    Subjective  Patient reports n omajor changes since the previous sessions. Patient reports she had not  had an oppertunity to perform any walking around the mall this past weekend.     Patient is accompained by:  -- Daughter    Pertinent History  Previously seen for balance disorders and for neck pain over the past year. Pt reports R knee feels sore today but is not painful.    Limitations  Walking;Sitting    How long can you walk comfortably?  3 min     Diagnostic tests  None    Patient Stated Goals  To be able to walk without an walker.     Currently in Pain?  No/denies        TREATMENT: Ambulation with focus on increasing speed and step length - 2 x 120ft with therapist guarding Nustep with use of LE and UE use on Nustep -- x 8 min Hip abduction in standing - 2 x 15 Hip extension in standing - 2 x 15  Step ups onto airex pad - x 10   Patient demonstrates increased fatigue at the end of the session     PT Education - 09/02/17 1409    Education provided  Yes    Education Details  form/technique with exercise    Person(s)  Educated  Patient    Methods  Demonstration;Explanation    Comprehension  Verbalized understanding;Returned demonstration          PT Long Term Goals - 08/26/17 1322      PT LONG TERM GOAL #1   Title  Patient will be independent with HEP to continue benefits of therapy after discharge.    Baseline  Requires moderate cueing with performance of exercise    Time  8    Period  Weeks    Status  On-going      PT LONG TERM GOAL #2   Title  Patient will improve TUG to under 12 seconds to indicate signficant improvement in fall risk.     Baseline  TUG: 24sec; 03/26/2017: TUG: 22sec; 05/12/17: 21.7 sec; 05/14/2017: 19sec; 08/26/2016: 14.75    Time  8    Period  Weeks    Status  On-going      PT LONG TERM GOAL #3   Title  Patient will improve 64minWT to over 1071ft to indicate functional improvement in walking tolerance and greater ability to walk in stores before requiring a sitting rest break    Baseline  42ft; 03/26/2017: 544ft; 05/12/17: 425ft (primary  deficit is DOE); 07/14/17: 424ft DOE; 08/26/2017: 3110ft    Time  8    Period  Weeks    Status  On-going      PT LONG TERM GOAL #4   Title  Patient will demonstrate ability to stand for over 2 min to allow for improved ability to perform household chores at home.     Baseline  able to stand for 50sec before requiring a sitting rest break; 03/26/2017: 60 sec before requiring a break; 05/12/17: 60 seconds per pt subjective report; 07/14/17: 30sec 08/26/2017: 5    Time  8    Period  Weeks    Status  On-going      PT LONG TERM GOAL #5   Title  Pt will decrease 5TSTS to <12 seconds from a chair at 19in in height in order to demonstrate clinically significant improvement in LE strength    Baseline  5xSTS: 14 sec from 22" height chair; 03/26/2017: 12.5sec; 05/12/17: 13.4 sec; 07/14/17: Deffered; 08/26/2017: 21sec from 21" height    Time  8    Period  Weeks    Status  On-going            Plan - 09/02/17 1418    Clinical Impression Statement  Patient demonstrates increased fatigue after ambulation requiring a sitting rest break after performing 1 lap (135ft) around the gym. She demonstrates difficulty with muscular endurance and cardiovascular endurance resulting in decreased ability to perform ambulation. Patient also requires UE support with balance secondary to poor hip control/coordination and will benefit from further skilled therapy to return to prior level of function.     Rehab Potential  Fair    Clinical Impairments Affecting Rehab Potential  Positive: motivation; Negative: age, chronicity, bilateral knee pain    PT Frequency  2x / week    PT Duration  8 weeks    PT Treatment/Interventions  ADLs/Self Care Home Management;Aquatic Therapy;Canalith Repostioning;Cryotherapy;Electrical Stimulation;Iontophoresis 4mg /ml Dexamethasone;Moist Heat;Traction;Ultrasound;DME Instruction;Gait training;Stair training;Functional mobility training;Therapeutic activities;Therapeutic exercise;Balance  training;Neuromuscular re-education;Patient/family education;Wheelchair mobility training;Manual techniques;Passive range of motion;Vestibular    PT Next Visit Plan  Progress strengthening, reinforce HEP    PT Home Exercise Plan  Cervical retraction    Consulted and Agree with Plan of Care  Patient;Family member/caregiver    Family Member  Consulted  Daughter       Patient will benefit from skilled therapeutic intervention in order to improve the following deficits and impairments:  Abnormal gait, Decreased strength, Obesity, Decreased activity tolerance, Decreased endurance, Difficulty walking, Decreased balance, Decreased mobility, Decreased coordination, Increased muscle spasms, Postural dysfunction, Hypomobility, Decreased range of motion  Visit Diagnosis: Muscle weakness (generalized)  Difficulty in walking, not elsewhere classified  Unsteadiness on feet     Problem List Patient Active Problem List   Diagnosis Date Noted  . Localized edema 02/13/2017  . Hemorrhoids 05/17/2016  . Obesity (BMI 30-39.9) 04/26/2016  . Multiple lung nodules 04/19/2016  . Chest pain 04/17/2016  . Partial symptomatic epilepsy with complex partial seizures, not intractable, without status epilepticus ([place]) 06/30/2015  . Bronchiectasis without complication ([place]) 00/86/7619  . Imbalance 11/08/2014  . Essential (primary) hypertension 11/08/2014  . Personal history of malignant neoplasm of breast 11/08/2014  . History of malignant carcinoid tumor of bronchus and lung 11/08/2014  . H/O peptic ulcer 11/08/2014  . Asthma, mild intermittent 11/08/2014  . PE (pulmonary embolism) 11/08/2014  . Gonalgia 11/08/2014  . Leg varices 11/08/2014    Blythe Stanford, PT DPT 09/02/2017, 2:28 PM  [facility] [address] [place], [address] Phone: [phone]   Fax:  [phone]  Name: ARPITA FENTRESS MRN: [id number] Date of Birth:  04-16-1925

## 2017-09-04 DIAGNOSIS — J479 Bronchiectasis, uncomplicated: Secondary | ICD-10-CM | POA: Diagnosis not present

## 2017-09-04 DIAGNOSIS — R05 Cough: Secondary | ICD-10-CM | POA: Diagnosis not present

## 2017-09-04 DIAGNOSIS — E669 Obesity, unspecified: Secondary | ICD-10-CM | POA: Diagnosis not present

## 2017-09-09 ENCOUNTER — Ambulatory Visit: Payer: Medicare Other | Admitting: Podiatry

## 2017-09-09 ENCOUNTER — Ambulatory Visit: Payer: Medicare Other

## 2017-09-09 DIAGNOSIS — M6281 Muscle weakness (generalized): Secondary | ICD-10-CM | POA: Diagnosis not present

## 2017-09-09 DIAGNOSIS — R2681 Unsteadiness on feet: Secondary | ICD-10-CM

## 2017-09-09 DIAGNOSIS — R262 Difficulty in walking, not elsewhere classified: Secondary | ICD-10-CM | POA: Diagnosis not present

## 2017-09-09 NOTE — Therapy (Signed)
[facility] [address], [place], [address] Phone: [phone]   Fax:  [phone]  Physical Therapy Treatment  Patient Details  Name: Rebecca Wolf MRN: [id number] Date of Birth: Oct 30, 1924 Referring Provider: Dr. Sharlet Salina   Encounter Date: 09/09/2017  PT End of Session - 09/09/17 1531    Visit Number  25    Number of Visits  36    Date for PT Re-Evaluation  10/07/17    PT Start Time  0086    PT Stop Time  1430    PT Time Calculation (min)  45 min    Equipment Utilized During Treatment  Gait belt    Activity Tolerance  Patient tolerated treatment well    Behavior During Therapy  [facility] for tasks assessed/performed       Past Medical History:  Diagnosis Date  . Asthma   . Breast cancer ([place]) 2003   LT LUMPECTOMY  . Breast cancer ([place]) 2004   LT LUMPECTOMY  . 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  . GERD (gastroesophageal reflux disease)   . Hemorrhoids   . Hypertension   . Personal history of chemotherapy 2004   BREAST CA  . Personal history of radiation therapy 2003   BREAST CA  . Personal history of radiation therapy 2004   BREAST CA  . Polyp of colon 2002   bleeding polyp of right ascending colon  . PUD (peptic ulcer disease)   . Seizures ([place])    epilepsy well controlled    Past Surgical History:  Procedure Laterality Date  . BREAST EXCISIONAL BIOPSY Left 2003   positive  . BREAST EXCISIONAL BIOPSY Left 2004   positive  . BREAST LUMPECTOMY Left 2003   BREAST CA  . BREAST LUMPECTOMY Left 2004   BREAST CA  . HEMICOLECTOMY Right    bowel obstruction  . HEMORRHOID SURGERY    . THORACOTOMY/LOBECTOMY  1999   carcinoid tumor  . TOTAL VAGINAL HYSTERECTOMY    . VARICOSE VEIN SURGERY      There were no vitals filed for this visit.  Subjective Assessment - 09/09/17 1415    Subjective  Patient reports no major changes since the previous visit. Patient reports she did not  walk since the previous session.     Patient is accompained by:  -- Daughter    Pertinent History  Previously seen for balance disorders and for neck pain over the past year. Pt reports R knee feels sore today but is not painful.    Limitations  Walking;Sitting    How long can you walk comfortably?  3 min     Diagnostic tests  None    Patient Stated Goals  To be able to walk without an walker.     Currently in Pain?  No/denies       TREATMENT: Therapeutic Exercise Ambulation with focus on increasing speed and step length - 2 x 151ft with therapist guarding Nustep with use of LE and UE use on Nustep -- x 8 min Heel raises with UE support - x 30  Hip abduction in standing - x 10 Sit to stand with UE support from therapist - 2 x 10   Patient demonstrates increased fatigue at the end of the session    PT Education - 09/09/17 1530    Education provided  Yes    Education Details  form/technique with exercise    Person(s) Educated  Patient  Methods  Explanation;Demonstration    Comprehension  Verbalized understanding;Returned demonstration          PT Long Term Goals - 08/26/17 1322      PT LONG TERM GOAL #1   Title  Patient will be independent with HEP to continue benefits of therapy after discharge.    Baseline  Requires moderate cueing with performance of exercise    Time  8    Period  Weeks    Status  On-going      PT LONG TERM GOAL #2   Title  Patient will improve TUG to under 12 seconds to indicate signficant improvement in fall risk.     Baseline  TUG: 24sec; 03/26/2017: TUG: 22sec; 05/12/17: 21.7 sec; 05/14/2017: 19sec; 08/26/2016: 14.75    Time  8    Period  Weeks    Status  On-going      PT LONG TERM GOAL #3   Title  Patient will improve 9minWT to over 1099ft to indicate functional improvement in walking tolerance and greater ability to walk in stores before requiring a sitting rest break    Baseline  485ft; 03/26/2017: 560ft; 05/12/17: 412ft (primary deficit is  DOE); 07/14/17: 436ft DOE; 08/26/2017: 345ft    Time  8    Period  Weeks    Status  On-going      PT LONG TERM GOAL #4   Title  Patient will demonstrate ability to stand for over 2 min to allow for improved ability to perform household chores at home.     Baseline  able to stand for 50sec before requiring a sitting rest break; 03/26/2017: 60 sec before requiring a break; 05/12/17: 60 seconds per pt subjective report; 07/14/17: 30sec 08/26/2017: 5    Time  8    Period  Weeks    Status  On-going      PT LONG TERM GOAL #5   Title  Pt will decrease 5TSTS to <12 seconds from a chair at 19in in height in order to demonstrate clinically significant improvement in LE strength    Baseline  5xSTS: 14 sec from 22" height chair; 03/26/2017: 12.5sec; 05/12/17: 13.4 sec; 07/14/17: Deffered; 08/26/2017: 21sec from 21" height    Time  8    Period  Weeks    Status  On-going            Plan - 09/09/17 1533    Clinical Impression Statement  Patient deomnstrates decreased endurance with exercises requiring frequent rest breaks with exercises. Patient demonstrates decreased muscular endurance with ambulation requiring increased sitting rest breaks. Educated patient on importance of performing a walking program and patient will benefit from further skilled therapy to return to prior level of function.     Rehab Potential  Fair    Clinical Impairments Affecting Rehab Potential  Positive: motivation; Negative: age, chronicity, bilateral knee pain    PT Frequency  2x / week    PT Duration  8 weeks    PT Treatment/Interventions  ADLs/Self Care Home Management;Aquatic Therapy;Canalith Repostioning;Cryotherapy;Electrical Stimulation;Iontophoresis 4mg /ml Dexamethasone;Moist Heat;Traction;Ultrasound;DME Instruction;Gait training;Stair training;Functional mobility training;Therapeutic activities;Therapeutic exercise;Balance training;Neuromuscular re-education;Patient/family education;Wheelchair mobility training;Manual  techniques;Passive range of motion;Vestibular    PT Next Visit Plan  Progress strengthening, reinforce HEP    PT Home Exercise Plan  Cervical retraction    Consulted and Agree with Plan of Care  Patient;Family member/caregiver    Family Member Consulted  Daughter       Patient will benefit from skilled therapeutic intervention in order to  improve the following deficits and impairments:  Abnormal gait, Decreased strength, Obesity, Decreased activity tolerance, Decreased endurance, Difficulty walking, Decreased balance, Decreased mobility, Decreased coordination, Increased muscle spasms, Postural dysfunction, Hypomobility, Decreased range of motion  Visit Diagnosis: Difficulty in walking, not elsewhere classified  Muscle weakness (generalized)  Unsteadiness on feet     Problem List Patient Active Problem List   Diagnosis Date Noted  . Localized edema 02/13/2017  . Hemorrhoids 05/17/2016  . Obesity (BMI 30-39.9) 04/26/2016  . Multiple lung nodules 04/19/2016  . Chest pain 04/17/2016  . Partial symptomatic epilepsy with complex partial seizures, not intractable, without status epilepticus ([place]) 06/30/2015  . Bronchiectasis without complication ([place]) 39/08/90  . Imbalance 11/08/2014  . Essential (primary) hypertension 11/08/2014  . Personal history of malignant neoplasm of breast 11/08/2014  . History of malignant carcinoid tumor of bronchus and lung 11/08/2014  . H/O peptic ulcer 11/08/2014  . Asthma, mild intermittent 11/08/2014  . PE (pulmonary embolism) 11/08/2014  . Gonalgia 11/08/2014  . Leg varices 11/08/2014    Blythe Stanford, PT DPT 09/09/2017, 3:49 PM  [facility] [address] [place], [address] Phone: [phone]   Fax:  [phone]  Name: Rebecca Wolf MRN: [id number] Date of Birth: [birth year]

## 2017-09-16 ENCOUNTER — Ambulatory Visit: Payer: Medicare Other

## 2017-09-16 DIAGNOSIS — R0602 Shortness of breath: Secondary | ICD-10-CM | POA: Diagnosis not present

## 2017-09-16 DIAGNOSIS — J479 Bronchiectasis, uncomplicated: Secondary | ICD-10-CM | POA: Diagnosis not present

## 2017-09-16 DIAGNOSIS — R918 Other nonspecific abnormal finding of lung field: Secondary | ICD-10-CM | POA: Diagnosis not present

## 2017-09-16 DIAGNOSIS — R9389 Abnormal findings on diagnostic imaging of other specified body structures: Secondary | ICD-10-CM | POA: Diagnosis not present

## 2017-09-16 DIAGNOSIS — R05 Cough: Secondary | ICD-10-CM | POA: Diagnosis not present

## 2017-09-16 DIAGNOSIS — R251 Tremor, unspecified: Secondary | ICD-10-CM | POA: Diagnosis not present

## 2017-09-23 ENCOUNTER — Other Ambulatory Visit: Payer: Self-pay

## 2017-09-23 ENCOUNTER — Encounter: Payer: Self-pay | Admitting: Hematology and Oncology

## 2017-09-23 ENCOUNTER — Ambulatory Visit: Payer: Medicare Other

## 2017-09-23 ENCOUNTER — Inpatient Hospital Stay: Payer: Medicare Other | Attending: Hematology and Oncology

## 2017-09-23 ENCOUNTER — Other Ambulatory Visit: Payer: Self-pay | Admitting: Hematology and Oncology

## 2017-09-23 ENCOUNTER — Inpatient Hospital Stay (HOSPITAL_BASED_OUTPATIENT_CLINIC_OR_DEPARTMENT_OTHER): Payer: Medicare Other | Admitting: Hematology and Oncology

## 2017-09-23 VITALS — BP 144/82 | HR 92 | Temp 95.7°F | Resp 22 | Wt 177.9 lb

## 2017-09-23 DIAGNOSIS — Z853 Personal history of malignant neoplasm of breast: Secondary | ICD-10-CM | POA: Insufficient documentation

## 2017-09-23 DIAGNOSIS — I10 Essential (primary) hypertension: Secondary | ICD-10-CM | POA: Diagnosis not present

## 2017-09-23 DIAGNOSIS — Z923 Personal history of irradiation: Secondary | ICD-10-CM

## 2017-09-23 DIAGNOSIS — K219 Gastro-esophageal reflux disease without esophagitis: Secondary | ICD-10-CM | POA: Diagnosis not present

## 2017-09-23 DIAGNOSIS — Z87891 Personal history of nicotine dependence: Secondary | ICD-10-CM | POA: Insufficient documentation

## 2017-09-23 DIAGNOSIS — R718 Other abnormality of red blood cells: Secondary | ICD-10-CM

## 2017-09-23 DIAGNOSIS — R000 Tachycardia, unspecified: Secondary | ICD-10-CM | POA: Insufficient documentation

## 2017-09-23 DIAGNOSIS — Z8711 Personal history of peptic ulcer disease: Secondary | ICD-10-CM

## 2017-09-23 DIAGNOSIS — Z86010 Personal history of colonic polyps: Secondary | ICD-10-CM

## 2017-09-23 DIAGNOSIS — Z7901 Long term (current) use of anticoagulants: Secondary | ICD-10-CM

## 2017-09-23 DIAGNOSIS — Z79899 Other long term (current) drug therapy: Secondary | ICD-10-CM | POA: Insufficient documentation

## 2017-09-23 DIAGNOSIS — C7A090 Malignant carcinoid tumor of the bronchus and lung: Secondary | ICD-10-CM

## 2017-09-23 DIAGNOSIS — Z902 Acquired absence of lung [part of]: Secondary | ICD-10-CM

## 2017-09-23 DIAGNOSIS — R918 Other nonspecific abnormal finding of lung field: Secondary | ICD-10-CM | POA: Insufficient documentation

## 2017-09-23 DIAGNOSIS — Z85110 Personal history of malignant carcinoid tumor of bronchus and lung: Secondary | ICD-10-CM | POA: Diagnosis not present

## 2017-09-23 LAB — COMPREHENSIVE METABOLIC PANEL
ALT: 27 U/L (ref 14–54)
AST: 40 U/L (ref 15–41)
Albumin: 3.7 g/dL (ref 3.5–5.0)
Alkaline Phosphatase: 96 U/L (ref 38–126)
Anion gap: 13 (ref 5–15)
BUN: 21 mg/dL — ABNORMAL HIGH (ref 6–20)
CO2: 18 mmol/L — ABNORMAL LOW (ref 22–32)
Calcium: 9.1 mg/dL (ref 8.9–10.3)
Chloride: 105 mmol/L (ref 101–111)
Creatinine, Ser: 1.07 mg/dL — ABNORMAL HIGH (ref 0.44–1.00)
GFR calc Af Amer: 51 mL/min — ABNORMAL LOW (ref >60)
GFR calc non Af Amer: 44 mL/min — ABNORMAL LOW (ref >60)
Glucose, Bld: 171 mg/dL — ABNORMAL HIGH (ref 65–99)
Potassium: 3.8 mmol/L (ref 3.5–5.1)
Sodium: 136 mmol/L (ref 135–145)
Total Bilirubin: 0.5 mg/dL (ref 0.3–1.2)
Total Protein: 8.2 g/dL — ABNORMAL HIGH (ref 6.5–8.1)

## 2017-09-23 LAB — CBC WITH DIFFERENTIAL/PLATELET
Basophils Absolute: 0.1 10*3/uL (ref 0–0.1)
Basophils Relative: 1 %
Eosinophils Absolute: 0.4 10*3/uL (ref 0–0.7)
Eosinophils Relative: 4 %
HCT: 36.7 % (ref 35.0–47.0)
Hemoglobin: 12 g/dL (ref 12.0–16.0)
Lymphocytes Relative: 34 %
Lymphs Abs: 2.9 10*3/uL (ref 1.0–3.6)
MCH: 25.1 pg — ABNORMAL LOW (ref 26.0–34.0)
MCHC: 32.7 g/dL (ref 32.0–36.0)
MCV: 76.6 fL — ABNORMAL LOW (ref 80.0–100.0)
Monocytes Absolute: 0.6 10*3/uL (ref 0.2–0.9)
Monocytes Relative: 7 %
Neutro Abs: 4.7 10*3/uL (ref 1.4–6.5)
Neutrophils Relative %: 54 %
Platelets: 268 10*3/uL (ref 150–440)
RBC: 4.79 MIL/uL (ref 3.80–5.20)
RDW: 15 % — ABNORMAL HIGH (ref 11.5–14.5)
WBC: 8.7 10*3/uL (ref 3.6–11.0)

## 2017-09-23 LAB — FERRITIN: Ferritin: 80 ng/mL (ref 11–307)

## 2017-09-23 NOTE — Progress Notes (Signed)
On 3/21 dx w " walking pneumonia " -abx given and CXR showed " scarring "   Had " breakthru seizure " 3 mo ago per daughter -saw Dr Elvin So med change   Saw Dr Clayborn Bigness and diltazem started  . Feeling better now per pt

## 2017-09-23 NOTE — Progress Notes (Signed)
[facility] day:  09/23/2017   Chief Complaint: Rebecca Wolf is a 84 y.o. female with a history of breast cancer, carcinoid tumor, and DVT/PE who is seen for 1 year assessment.  HPI:  The patient was last seen in the medical oncology clinic on 09/24/2016.  At that time, she was doing well.  Exam was stable.  Hematocrit was normal.  MCV was slightly low.  Ferritin was 43.  CA27.29 was 20.7 (normal).  Mammogram on 10/16/2016 revealed no evidence of malignancy.  She was seen in the [facility] ER on 06/29/2017 for seizures.  Family noted some increased shortness of breath above her baseline.  Chest CT angiogram on 06/29/2017 revealed no evidence of significant pulmonary embolus.  There was an unchanged appearance of right paratracheal nodule and multiple pulmonary nodules.  There was no evidence of active pulmonary disease. There was scarring throughout the lungs.  CBC revealed a hematocrit of 37.9, hemoglobin 12.3, and MCV 77.2.  During the interim, patient notes a recent illness over the last 2 weeks. Patient with cold symptoms. She was seen in consult by Dr. Raul Del. Patient was treated with a course of antibiotics. Patient has improved. Patient has had no recurrent seizures since December 2018. Patient recently started on Diltiazem for what is described as a non-specific TACHYcardia. She has not followed up with cardiology.   Patient denies bruising or bleeding on Eliquis. She is eating well.  Diet is good.  Patient down 1 pound. Patient denies pain.   Patient does not perform a monthly self breast examination as recommended.  She denies any breast concerns.   Past Medical History:  Diagnosis Date  . Asthma   . Breast cancer ([place]) 2003   LT LUMPECTOMY  . Breast cancer ([place]) 2004   LT LUMPECTOMY  . 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  . GERD (gastroesophageal reflux disease)   . Hemorrhoids   . Hypertension   . Personal history of  chemotherapy 2004   BREAST CA  . Personal history of radiation therapy 2003   BREAST CA  . Personal history of radiation therapy 2004   BREAST CA  . Polyp of colon 2002   bleeding polyp of right ascending colon  . PUD (peptic ulcer disease)   . Seizures ([place])    epilepsy well controlled    Past Surgical History:  Procedure Laterality Date  . BREAST EXCISIONAL BIOPSY Left 2003   positive  . BREAST EXCISIONAL BIOPSY Left 2004   positive  . BREAST LUMPECTOMY Left 2003   BREAST CA  . BREAST LUMPECTOMY Left 2004   BREAST CA  . HEMICOLECTOMY Right    bowel obstruction  . HEMORRHOID SURGERY    . THORACOTOMY/LOBECTOMY  1999   carcinoid tumor  . TOTAL VAGINAL HYSTERECTOMY    . VARICOSE VEIN SURGERY      Family History  Problem Relation Age of Onset  . Alcoholism Father   . Diabetes Sister   . Breast cancer Neg Hx     Social History:  reports that she has quit smoking. She has never used smokeless tobacco. She reports that she does not drink alcohol or use drugs.  She moved from [place] to [place].  The patient is accompanied by her daughter, [name], today.  Allergies:  Allergies  Allergen Reactions  . Levofloxacin Shortness Of Breath  . Sulfa Antibiotics Rash    Current Medications: Current Outpatient Medications  Medication Sig Dispense Refill  .  amLODipine (NORVASC) 5 MG tablet TAKE 1 TABLET BY MOUTH EVERY DAY 90 tablet 3  . cefUROXime (CEFTIN) 500 MG tablet   0  . diltiazem (CARDIZEM CD) 120 MG 24 hr capsule Take by mouth.    . ELIQUIS 2.5 MG TABS tablet TAKE 1 TABLET BY MOUTH TWICE DAILY 60 tablet 12  . fluticasone (FLONASE) 50 MCG/ACT nasal spray Place 2 sprays into the nose daily.    . Fluticasone-Salmeterol (ADVAIR) 500-50 MCG/DOSE AEPB Inhale 1 puff into the lungs 2 (two) times daily.    . Fluticasone-Salmeterol (WIXELA INHUB) 500-50 MCG/DOSE AEPB Inhale 1 puff into the lungs 2 (two) times daily.    Marland Kitchen levETIRAcetam (KEPPRA) 750 MG tablet Take 1 tablet by  mouth 2 (two) times daily.    . montelukast (SINGULAIR) 10 MG tablet Take 1 tablet by mouth daily.    . Olopatadine HCl (PATANASE) 0.6 % SOLN Place 1 puff into the nose 2 (two) times daily.    . pantoprazole (PROTONIX) 40 MG tablet TAKE 1 TABLET BY MOUTH TWICE DAILY 60 tablet 5  . spironolactone-hydrochlorothiazide (ALDACTAZIDE) 25-25 MG tablet TAKE 1 TABLET BY MOUTH DAILY 90 tablet 3  . acetaminophen (TYLENOL) 500 MG tablet Take 500 mg by mouth every 6 (six) hours as needed.    Marland Kitchen albuterol (ACCUNEB) 1.25 MG/3ML nebulizer solution Inhale 1 ampule into the lungs 4 (four) times daily as needed.    . sucralfate (CARAFATE) 1 g tablet Take 1 tablet (1 g total) by mouth 4 (four) times daily -  with meals and at bedtime. (Patient not taking: Reported on 06/29/2017) 90 tablet 2   No current facility-administered medications for this visit.     Review of Systems:  GENERAL:  Feels "ok".  No fevers or sweats.  Weight down 1 pounds. PERFORMANCE STATUS (ECOG):  2 HEENT:  No visual changes, runny nose, sore throat, mouth sores or tenderness. Lungs: Chronic shortness of breath (followed by pulmonary).  No cough.  No hemoptysis. Cardiac:  No chest pain, palpitations, orthopnea, or PND. GI:  Eating well.  No nausea, vomiting, diarrhea, constipation, melena or hematochezia. GU:  No urgency, frequency, dysuria, or hematuria. Musculoskeletal:  No back pain.  No joint pain.  No muscle tenderness. Extremities:  No pain or swelling. Skin:  No skin changes. Neuro:  Neuropathy in hands and feet.  Seizure in 05/2017. No headache, focal weakness, balance or coordination issues. Endocrine:  No diabetes, thyroid issues, hot flashes or night sweats. Psych:  No mood changes, depression or anxiety. Pain:  No focal pain. Review of systems:  All other systems reviewed and found to be negative.  Physical Exam: Blood pressure (!) 144/82, pulse 92, temperature (!) 95.7 F (35.4 C), temperature source Tympanic, resp. rate  (!) 22, weight 177 lb 14.4 oz (80.7 kg). GENERAL:  Well developed, well nourished, woman sitting comfortably in a wheelchair under a blanket in the exam room in no acute distress. MENTAL STATUS:  Alert and oriented to person, place and time. HEAD:  Pearline Cables hair pulled back.  Normocephalic, atraumatic, face symmetric, no Cushingoid features. EYES:  Blue gray eyes.  Pupils equal round and reactive to light and accomodation.  No conjunctivitis or scleral icterus. ENT:  Oropharynx clear without lesion.  Tongue normal.  Left lower fractured tooth.  Mucous membranes moist.  RESPIRATORY:  Clear to auscultation without rales, wheezes or rhonchi. CARDIOVASCULAR:  Regular rate and rhythm without murmur, rub or gallop. BREAST:  Right breast without masses, skin changes or nipple discharge.  Left breast with post operative and post radiation changes at 11 o'clock.  No skin changes or nipple discharge.  ABDOMEN:  Soft, non-tender, with active bowel sounds, and no hepatosplenomegaly.  No masses. SKIN:  No rashes or skin changes. EXTREMITIES: No edema, no skin discoloration or tenderness.  No palpable cords. LYMPH NODES: No palpable cervical, supraclavicular, axillary or inguinal adenopathy  NEUROLOGICAL: Unremarkable. PSYCH:  Appropriate.   Appointment on 09/23/2017  Component Date Value Ref Range Status  . WBC 09/23/2017 8.7  3.6 - 11.0 K/uL Final  . RBC 09/23/2017 4.79  3.80 - 5.20 MIL/uL Final  . Hemoglobin 09/23/2017 12.0  12.0 - 16.0 g/dL Final  . HCT 09/23/2017 36.7  35.0 - 47.0 % Final  . MCV 09/23/2017 76.6* 80.0 - 100.0 fL Final  . MCH 09/23/2017 25.1* 26.0 - 34.0 pg Final  . MCHC 09/23/2017 32.7  32.0 - 36.0 g/dL Final  . RDW 09/23/2017 15.0* 11.5 - 14.5 % Final  . Platelets 09/23/2017 268  150 - 440 K/uL Final  . Neutrophils Relative % 09/23/2017 54  % Final  . Neutro Abs 09/23/2017 4.7  1.4 - 6.5 K/uL Final  . Lymphocytes Relative 09/23/2017 34  % Final  . Lymphs Abs 09/23/2017 2.9  1.0 -  3.6 K/uL Final  . Monocytes Relative 09/23/2017 7  % Final  . Monocytes Absolute 09/23/2017 0.6  0.2 - 0.9 K/uL Final  . Eosinophils Relative 09/23/2017 4  % Final  . Eosinophils Absolute 09/23/2017 0.4  0 - 0.7 K/uL Final  . Basophils Relative 09/23/2017 1  % Final  . Basophils Absolute 09/23/2017 0.1  0 - 0.1 K/uL Final   Performed at [facility], [address], [place], [place] [address]  . Sodium 09/23/2017 136  135 - 145 mmol/L Final  . Potassium 09/23/2017 3.8  3.5 - 5.1 mmol/L Final  . Chloride 09/23/2017 105  101 - 111 mmol/L Final  . CO2 09/23/2017 18* 22 - 32 mmol/L Final  . Glucose, Bld 09/23/2017 171* 65 - 99 mg/dL Final  . BUN 09/23/2017 21* 6 - 20 mg/dL Final  . Creatinine, Ser 09/23/2017 1.07* 0.44 - 1.00 mg/dL Final  . Calcium 09/23/2017 9.1  8.9 - 10.3 mg/dL Final  . Total Protein 09/23/2017 8.2* 6.5 - 8.1 g/dL Final  . Albumin 09/23/2017 3.7  3.5 - 5.0 g/dL Final  . AST 09/23/2017 40  15 - 41 U/L Final  . ALT 09/23/2017 27  14 - 54 U/L Final  . Alkaline Phosphatase 09/23/2017 96  38 - 126 U/L Final  . Total Bilirubin 09/23/2017 0.5  0.3 - 1.2 mg/dL Final  . GFR calc non Af Amer 09/23/2017 44* >60 mL/min Final  . GFR calc Af Amer 09/23/2017 51* >60 mL/min Final   Comment: (NOTE) The eGFR has been calculated using the CKD EPI equation. This calculation has not been validated in all clinical situations. eGFR's persistently <60 mL/min signify possible Chronic Kidney Disease.   Georgiann Hahn gap 09/23/2017 13  5 - 15 Final   Performed at [facility], [address] [place], [place] [address]    Assessment:  Rebecca Wolf is a 84 y.o. female with a history of breast cancer, carcinoid tumor, and DVT/PE.  She was diagnosed with precancerous breast lesion (? DCIS) in 2003.  She underwent lumpectomy.  She developed invasive lobular carcinoma in 2007. She underwent segmental mastectomy and sentinel lymph node biopsy followed by radiation.  No lymph nodes were  involved.  Tumor was ER/PR positive.  She completed 5 years of Arimidex in 08/2011. Mammogram on 10/16/2016 was negative.  CA27.29 was 20.7 (normal) on 09/24/2016.  She was diagnosed with pulmonary carcinoid in 2001 after presenting with an abnormality on chest CT.  She was asymptomatic.  She underwent resection.  She has had no evidence of recurrent disease.  She has a history of DVT x 2 and pulmonary embolism x 2.  She has been on anticoagulation for 8-10 years.  Chest CT angiogram on 06/29/2017 revealed no evidence of significant pulmonary embolus.  There was an unchanged appearance of right paratracheal nodule and multiple pulmonary nodules.  She was initially on Coumadin, but switched to Eliquis in 2016.  She is unaware of any hypercoagulable work-up.   She has a history of GI bleeding in 2002.  She is s/p partial right ascending hemicolectomy for a bleeding polyp.  She has a history of reflux and peptic ulcer disease.  Hematocrit is normal (37.8).  MCV is microcytic.  While in [place], a hemoglobin electrophoresis was performed (no result available).  She was started on oral on 08/31/2012.  She is currently not taking iron.  Symptomatically, she is doing well.  Exam is stable.  Hematocrit is normal.  MVC is slightly low.  Ferritin is 80.  Plan: 1.  Labs today: CBC with diff, CMP, CA27.29, ferritin. 2.  Review interval mammogram - no evidence of malignancy. 3.  Review interval CT angiogram- no pulmonary embolism. 4.  Mammogram on 10/16/2017. 5.  Continue Eliquis. 6.  RTC in 1 year for MD assessment, labs (CBC with diff, CMP, CA27.29), and review of mammogram.   Honor Loh, NP  09/23/2017, 12:35 PM   I saw and evaluated the patient, participating in the key portions of the service and reviewing pertinent diagnostic studies and records.  I reviewed the nurse practitioner's note and agree with the findings and the plan.  The assessment and plan were discussed with the patient.  Several  questions were asked by the patient and answered.   Nolon Stalls, MD 09/23/2017,12:35 PM

## 2017-09-24 LAB — CANCER ANTIGEN 27.29: CA 27.29: 21.3 U/mL (ref 0.0–38.6)

## 2017-09-25 ENCOUNTER — Other Ambulatory Visit: Payer: Medicare Other

## 2017-09-25 ENCOUNTER — Ambulatory Visit: Payer: Medicare Other

## 2017-09-25 ENCOUNTER — Ambulatory Visit: Payer: Medicare Other | Admitting: Hematology and Oncology

## 2017-09-25 DIAGNOSIS — R2681 Unsteadiness on feet: Secondary | ICD-10-CM

## 2017-09-25 DIAGNOSIS — R262 Difficulty in walking, not elsewhere classified: Secondary | ICD-10-CM

## 2017-09-25 DIAGNOSIS — M6281 Muscle weakness (generalized): Secondary | ICD-10-CM

## 2017-09-25 NOTE — Therapy (Signed)
[facility] [address] [place], [address] Phone: [phone]   Fax:  [phone]  Physical Therapy Treatment  Patient Details  Name: Rebecca Wolf MRN: [id number] Date of Birth: [birth year] Referring Provider: Dr. Sharlet Salina   Encounter Date: 09/25/2017  PT End of Session - 09/25/17 1327    Visit Number  26    Number of Visits  36    Date for PT Re-Evaluation  10/07/17    PT Start Time  1300    PT Stop Time  1345    PT Time Calculation (min)  45 min    Equipment Utilized During Treatment  Gait belt    Activity Tolerance  Patient tolerated treatment well    Behavior During Therapy  [facility] for tasks assessed/performed       Past Medical History:  Diagnosis Date  . Asthma   . Breast cancer ([place]) 2003   LT LUMPECTOMY  . Breast cancer ([place]) 2004   LT LUMPECTOMY  . 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  . GERD (gastroesophageal reflux disease)   . Hemorrhoids   . Hypertension   . Personal history of chemotherapy 2004   BREAST CA  . Personal history of radiation therapy 2003   BREAST CA  . Personal history of radiation therapy 2004   BREAST CA  . Polyp of colon 2002   bleeding polyp of right ascending colon  . PUD (peptic ulcer disease)   . Seizures ([place])    epilepsy well controlled    Past Surgical History:  Procedure Laterality Date  . BREAST EXCISIONAL BIOPSY Left 2003   positive  . BREAST EXCISIONAL BIOPSY Left 2004   positive  . BREAST LUMPECTOMY Left 2003   BREAST CA  . BREAST LUMPECTOMY Left 2004   BREAST CA  . HEMICOLECTOMY Right    bowel obstruction  . HEMORRHOID SURGERY    . THORACOTOMY/LOBECTOMY  1999   carcinoid tumor  . TOTAL VAGINAL HYSTERECTOMY    . VARICOSE VEIN SURGERY      There were no vitals filed for this visit.  Subjective Assessment - 09/25/17 1307    Subjective  Patient reports she has been sick and had a case of 'walking pneumonia'. Patient states  she has not had a chance to walk secondary to being sick.     Pertinent History  Previously seen for balance disorders and for neck pain over the past year. Pt reports R knee feels sore today but is not painful.    Limitations  Walking;Sitting    How long can you walk comfortably?  3 min     Diagnostic tests  None    Patient Stated Goals  To be able to walk without an walker.     Currently in Pain?  No/denies       TREATMENT: Therapeutic Exercise Ambulation with focus on increasing speed and step length - 2 x 162ft with therapist guarding Nustep with use of LE and UE use on Nustep -- x 7 min level 5 for 4 minute; level 4 for 2 min; level 3 for 1 min Step ups onto airex pad - x15  Heel raises with UE support on airex pad - x 20  Hip abduction in standing - x 10 on airex pad    Patient demonstrates increased fatigue at the end of the session   PT Education - 09/25/17 1326    Education provided  Yes  Education Details  form/technique with exercise    Person(s) Educated  Patient    Methods  Explanation;Demonstration    Comprehension  Verbalized understanding;Returned demonstration          PT Long Term Goals - 08/26/17 1322      PT LONG TERM GOAL #1   Title  Patient will be independent with HEP to continue benefits of therapy after discharge.    Baseline  Requires moderate cueing with performance of exercise    Time  8    Period  Weeks    Status  On-going      PT LONG TERM GOAL #2   Title  Patient will improve TUG to under 12 seconds to indicate signficant improvement in fall risk.     Baseline  TUG: 24sec; 03/26/2017: TUG: 22sec; 05/12/17: 21.7 sec; 05/14/2017: 19sec; 08/26/2016: 14.75    Time  8    Period  Weeks    Status  On-going      PT LONG TERM GOAL #3   Title  Patient will improve 40minWT to over 1049ft to indicate functional improvement in walking tolerance and greater ability to walk in stores before requiring a sitting rest break    Baseline  411ft; 03/26/2017:  561ft; 05/12/17: 415ft (primary deficit is DOE); 07/14/17: 465ft DOE; 08/26/2017: 362ft    Time  8    Period  Weeks    Status  On-going      PT LONG TERM GOAL #4   Title  Patient will demonstrate ability to stand for over 2 min to allow for improved ability to perform household chores at home.     Baseline  able to stand for 50sec before requiring a sitting rest break; 03/26/2017: 60 sec before requiring a break; 05/12/17: 60 seconds per pt subjective report; 07/14/17: 30sec 08/26/2017: 5    Time  8    Period  Weeks    Status  On-going      PT LONG TERM GOAL #5   Title  Pt will decrease 5TSTS to <12 seconds from a chair at 19in in height in order to demonstrate clinically significant improvement in LE strength    Baseline  5xSTS: 14 sec from 22" height chair; 03/26/2017: 12.5sec; 05/12/17: 13.4 sec; 07/14/17: Deffered; 08/26/2017: 21sec from 21" height    Time  8    Period  Weeks    Status  On-going            Plan - 09/25/17 1332    Clinical Impression Statement  Patient demonstrates decreased strength and endurance with activity. Focused on taking long breaks as patient is recovering from an upper respiratory infection. Performed exercises on balance airex pad to further address balance deficits. Patient will continue to benefit from further skilled therapy to return to prior level of function.     Rehab Potential  Fair    Clinical Impairments Affecting Rehab Potential  Positive: motivation; Negative: age, chronicity, bilateral knee pain    PT Frequency  2x / week    PT Duration  8 weeks    PT Treatment/Interventions  ADLs/Self Care Home Management;Aquatic Therapy;Canalith Repostioning;Cryotherapy;Electrical Stimulation;Iontophoresis 4mg /ml Dexamethasone;Moist Heat;Traction;Ultrasound;DME Instruction;Gait training;Stair training;Functional mobility training;Therapeutic activities;Therapeutic exercise;Balance training;Neuromuscular re-education;Patient/family education;Wheelchair mobility  training;Manual techniques;Passive range of motion;Vestibular    PT Next Visit Plan  Progress strengthening, reinforce HEP    PT Home Exercise Plan  Cervical retraction    Consulted and Agree with Plan of Care  Patient;Family member/caregiver    Family Member Consulted  Daughter       Patient will benefit from skilled therapeutic intervention in order to improve the following deficits and impairments:  Abnormal gait, Decreased strength, Obesity, Decreased activity tolerance, Decreased endurance, Difficulty walking, Decreased balance, Decreased mobility, Decreased coordination, Increased muscle spasms, Postural dysfunction, Hypomobility, Decreased range of motion  Visit Diagnosis: Muscle weakness (generalized)  Difficulty in walking, not elsewhere classified  Unsteadiness on feet     Problem List Patient Active Problem List   Diagnosis Date Noted  . Microcytic red blood cells 09/23/2017  . Localized edema 02/13/2017  . Hemorrhoids 05/17/2016  . Obesity (BMI 30-39.9) 04/26/2016  . Multiple lung nodules 04/19/2016  . Chest pain 04/17/2016  . Partial symptomatic epilepsy with complex partial seizures, not intractable, without status epilepticus ([place]) 06/30/2015  . Bronchiectasis without complication ([place]) 08/65/7846  . Imbalance 11/08/2014  . Essential (primary) hypertension 11/08/2014  . Personal history of malignant neoplasm of breast 11/08/2014  . History of malignant carcinoid tumor of bronchus and lung 11/08/2014  . H/O peptic ulcer 11/08/2014  . Asthma, mild intermittent 11/08/2014  . PE (pulmonary embolism) 11/08/2014  . Gonalgia 11/08/2014  . Leg varices 11/08/2014    Blythe Stanford, PT DPT 09/25/2017, 1:43 PM  [facility] [address] [place], [address] Phone: [phone]   Fax:  [phone]  Name: Rebecca Wolf MRN: [id number] Date of Birth: [birth year]

## 2017-09-30 ENCOUNTER — Ambulatory Visit: Payer: Medicare Other | Attending: Physical Medicine and Rehabilitation

## 2017-09-30 ENCOUNTER — Ambulatory Visit: Payer: Medicare Other | Admitting: Podiatry

## 2017-09-30 DIAGNOSIS — R262 Difficulty in walking, not elsewhere classified: Secondary | ICD-10-CM | POA: Insufficient documentation

## 2017-09-30 DIAGNOSIS — R2681 Unsteadiness on feet: Secondary | ICD-10-CM | POA: Diagnosis not present

## 2017-09-30 DIAGNOSIS — M6281 Muscle weakness (generalized): Secondary | ICD-10-CM | POA: Insufficient documentation

## 2017-09-30 NOTE — Therapy (Signed)
[facility] [address] [place], [address] Phone: [phone]   Fax:  [phone]  Physical Therapy Treatment  Patient Details  Name: Rebecca Wolf MRN: [id number] Date of Birth: 1924/09/03 Referring Provider: Dr. Sharlet Salina   Encounter Date: 09/30/2017  PT End of Session - 09/30/17 1327    Visit Number  27    Number of Visits  36    Date for PT Re-Evaluation  10/07/17    PT Start Time  1300    PT Stop Time  1345    PT Time Calculation (min)  45 min    Equipment Utilized During Treatment  Gait belt    Activity Tolerance  Patient tolerated treatment well    Behavior During Therapy  [facility] for tasks assessed/performed       Past Medical History:  Diagnosis Date  . Asthma   . Breast cancer ([place]) 2003   LT LUMPECTOMY  . Breast cancer ([place]) 2004   LT LUMPECTOMY  . 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  . GERD (gastroesophageal reflux disease)   . Hemorrhoids   . Hypertension   . Personal history of chemotherapy 2004   BREAST CA  . Personal history of radiation therapy 2003   BREAST CA  . Personal history of radiation therapy 2004   BREAST CA  . Polyp of colon 2002   bleeding polyp of right ascending colon  . PUD (peptic ulcer disease)   . Seizures ([place])    epilepsy well controlled    Past Surgical History:  Procedure Laterality Date  . BREAST EXCISIONAL BIOPSY Left 2003   positive  . BREAST EXCISIONAL BIOPSY Left 2004   positive  . BREAST LUMPECTOMY Left 2003   BREAST CA  . BREAST LUMPECTOMY Left 2004   BREAST CA  . HEMICOLECTOMY Right    bowel obstruction  . HEMORRHOID SURGERY    . THORACOTOMY/LOBECTOMY  1999   carcinoid tumor  . TOTAL VAGINAL HYSTERECTOMY    . VARICOSE VEIN SURGERY      There were no vitals filed for this visit.  Subjective Assessment - 09/30/17 1326    Subjective  Patient reports she has been having knee pain most notably with standing up. Patient  reports it on the L side more than the right but does not have any pain with sitting.     Pertinent History  Previously seen for balance disorders and for neck pain over the past year. Pt reports R knee feels sore today but is not painful.    Limitations  Walking;Sitting    How long can you walk comfortably?  3 min     Diagnostic tests  None    Patient Stated Goals  To be able to walk without an walker.     Currently in Pain?  No/denies         TREATMENT: Therapeutic Exercise Ambulation with focus on increasing speed and step length - 2 x 15ft with therapist guarding Nustep with use of LE and UE use on Nustep -- x 7 min level 3  Side stepping - 4 x 46ft  Step ups onto 6" step - x5 B  Patient demonstrates increased fatigue at the end of the session   PT Education - 09/30/17 1327    Education provided  Yes    Education Details  form/technique with exercise    Person(s) Educated  Patient    Methods  Explanation;Demonstration  Comprehension  Verbalized understanding;Returned demonstration          PT Long Term Goals - 08/26/17 1322      PT LONG TERM GOAL #1   Title  Patient will be independent with HEP to continue benefits of therapy after discharge.    Baseline  Requires moderate cueing with performance of exercise    Time  8    Period  Weeks    Status  On-going      PT LONG TERM GOAL #2   Title  Patient will improve TUG to under 12 seconds to indicate signficant improvement in fall risk.     Baseline  TUG: 24sec; 03/26/2017: TUG: 22sec; 05/12/17: 21.7 sec; 05/14/2017: 19sec; 08/26/2016: 14.75    Time  8    Period  Weeks    Status  On-going      PT LONG TERM GOAL #3   Title  Patient will improve 72minWT to over 1068ft to indicate functional improvement in walking tolerance and greater ability to walk in stores before requiring a sitting rest break    Baseline  422ft; 03/26/2017: 575ft; 05/12/17: 452ft (primary deficit is DOE); 07/14/17: 413ft DOE; 08/26/2017: 340ft     Time  8    Period  Weeks    Status  On-going      PT LONG TERM GOAL #4   Title  Patient will demonstrate ability to stand for over 2 min to allow for improved ability to perform household chores at home.     Baseline  able to stand for 50sec before requiring a sitting rest break; 03/26/2017: 60 sec before requiring a break; 05/12/17: 60 seconds per pt subjective report; 07/14/17: 30sec 08/26/2017: 5    Time  8    Period  Weeks    Status  On-going      PT LONG TERM GOAL #5   Title  Pt will decrease 5TSTS to <12 seconds from a chair at 19in in height in order to demonstrate clinically significant improvement in LE strength    Baseline  5xSTS: 14 sec from 22" height chair; 03/26/2017: 12.5sec; 05/12/17: 13.4 sec; 07/14/17: Deffered; 08/26/2017: 21sec from 21" height    Time  8    Period  Weeks    Status  On-going            Plan - 09/30/17 1338    Clinical Impression Statement  Patient demonstrates difficulty with performing step ups requiring a seated rest break after performing 5 repetitions. Patient demonstrates difficulty with walking side ways with increased postural sway and fasiculations with performance. Patient demonstrates less sway with performance after cueing to perform 2 deep breaths indicating increased nervousness with balance. Patient will benefit from further skilled therapy to return to prior level of function.     Rehab Potential  Fair    Clinical Impairments Affecting Rehab Potential  Positive: motivation; Negative: age, chronicity, bilateral knee pain    PT Frequency  2x / week    PT Duration  8 weeks    PT Treatment/Interventions  ADLs/Self Care Home Management;Aquatic Therapy;Canalith Repostioning;Cryotherapy;Electrical Stimulation;Iontophoresis 4mg /ml Dexamethasone;Moist Heat;Traction;Ultrasound;DME Instruction;Gait training;Stair training;Functional mobility training;Therapeutic activities;Therapeutic exercise;Balance training;Neuromuscular re-education;Patient/family  education;Wheelchair mobility training;Manual techniques;Passive range of motion;Vestibular    PT Next Visit Plan  Progress strengthening, reinforce HEP    PT Home Exercise Plan  Cervical retraction    Consulted and Agree with Plan of Care  Patient;Family member/caregiver    Family Member Consulted  Daughter       Patient  will benefit from skilled therapeutic intervention in order to improve the following deficits and impairments:  Abnormal gait, Decreased strength, Obesity, Decreased activity tolerance, Decreased endurance, Difficulty walking, Decreased balance, Decreased mobility, Decreased coordination, Increased muscle spasms, Postural dysfunction, Hypomobility, Decreased range of motion  Visit Diagnosis: Muscle weakness (generalized)  Difficulty in walking, not elsewhere classified  Unsteadiness on feet     Problem List Patient Active Problem List   Diagnosis Date Noted  . Microcytic red blood cells 09/23/2017  . Localized edema 02/13/2017  . Hemorrhoids 05/17/2016  . Obesity (BMI 30-39.9) 04/26/2016  . Multiple lung nodules 04/19/2016  . Chest pain 04/17/2016  . Partial symptomatic epilepsy with complex partial seizures, not intractable, without status epilepticus ([place]) 06/30/2015  . Bronchiectasis without complication ([place]) 19/37/9024  . Imbalance 11/08/2014  . Essential (primary) hypertension 11/08/2014  . Personal history of malignant neoplasm of breast 11/08/2014  . History of malignant carcinoid tumor of bronchus and lung 11/08/2014  . H/O peptic ulcer 11/08/2014  . Asthma, mild intermittent 11/08/2014  . PE (pulmonary embolism) 11/08/2014  . Gonalgia 11/08/2014  . Leg varices 11/08/2014    Blythe Stanford, PT DPT 09/30/2017, 2:58 PM  [facility] [address], [place], [address] Phone: [phone]   Fax:  [phone]  Name: Rebecca Wolf MRN: [id number] Date of Birth: 04/29/1925

## 2017-10-02 DIAGNOSIS — J441 Chronic obstructive pulmonary disease with (acute) exacerbation: Secondary | ICD-10-CM | POA: Diagnosis not present

## 2017-10-02 DIAGNOSIS — J479 Bronchiectasis, uncomplicated: Secondary | ICD-10-CM | POA: Diagnosis not present

## 2017-10-02 DIAGNOSIS — J310 Chronic rhinitis: Secondary | ICD-10-CM | POA: Diagnosis not present

## 2017-10-02 DIAGNOSIS — R0602 Shortness of breath: Secondary | ICD-10-CM | POA: Diagnosis not present

## 2017-10-02 DIAGNOSIS — R05 Cough: Secondary | ICD-10-CM | POA: Diagnosis not present

## 2017-10-07 ENCOUNTER — Ambulatory Visit: Payer: Medicare Other

## 2017-10-07 DIAGNOSIS — R2681 Unsteadiness on feet: Secondary | ICD-10-CM

## 2017-10-07 DIAGNOSIS — R262 Difficulty in walking, not elsewhere classified: Secondary | ICD-10-CM | POA: Diagnosis not present

## 2017-10-07 DIAGNOSIS — M6281 Muscle weakness (generalized): Secondary | ICD-10-CM

## 2017-10-07 NOTE — Therapy (Signed)
[facility] [address] [place], [address] Phone: [phone]   Fax:  [phone]  Physical Therapy Treatment  Patient Details  Name: Rebecca Wolf MRN: [id number] Date of Birth: [birth year] Referring Provider: Dr. Sharlet Salina   Encounter Date: 10/07/2017  PT End of Session - 10/07/17 1312    Visit Number  28    Number of Visits  36    Date for PT Re-Evaluation  10/07/17    PT Start Time  9485    PT Stop Time  1345    PT Time Calculation (min)  40 min    Equipment Utilized During Treatment  Gait belt    Activity Tolerance  Patient tolerated treatment well    Behavior During Therapy  [facility] for tasks assessed/performed       Past Medical History:  Diagnosis Date  . Asthma   . Breast cancer ([place]) 2003   LT LUMPECTOMY  . Breast cancer ([place]) 2004   LT LUMPECTOMY  . 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  . GERD (gastroesophageal reflux disease)   . Hemorrhoids   . Hypertension   . Personal history of chemotherapy 2004   BREAST CA  . Personal history of radiation therapy 2003   BREAST CA  . Personal history of radiation therapy 2004   BREAST CA  . Polyp of colon 2002   bleeding polyp of right ascending colon  . PUD (peptic ulcer disease)   . Seizures ([place])    epilepsy well controlled    Past Surgical History:  Procedure Laterality Date  . BREAST EXCISIONAL BIOPSY Left 2003   positive  . BREAST EXCISIONAL BIOPSY Left 2004   positive  . BREAST LUMPECTOMY Left 2003   BREAST CA  . BREAST LUMPECTOMY Left 2004   BREAST CA  . HEMICOLECTOMY Right    bowel obstruction  . HEMORRHOID SURGERY    . THORACOTOMY/LOBECTOMY  1999   carcinoid tumor  . TOTAL VAGINAL HYSTERECTOMY    . VARICOSE VEIN SURGERY      There were no vitals filed for this visit.  Subjective Assessment - 10/07/17 1309    Subjective  Patient reports her L knee has been feeling sore but reports her knees have been feeling  better since the previous treatment session.     Pertinent History  Previously seen for balance disorders and for neck pain over the past year. Pt reports R knee feels sore today but is not painful.    Limitations  Walking;Sitting    How long can you walk comfortably?  3 min     Diagnostic tests  None    Patient Stated Goals  To be able to walk without an walker.     Currently in Pain?  No/denies         TREATMENT: Therapeutic Exercise Ambulation with focus on increasing speed and step length - 2 x 122ft with therapist guarding Nustep with use of LE and UE use on Nustep -- x 10 min level 3  Hip abduction in standing with UE support - x 15 B Step ups onto 6" step - x10 B   Patient demonstrates increased fatigue at the end of the session    PT Education - 10/07/17 1312    Education provided  Yes    Education Details  form/technique with exercise    Person(s) Educated  Patient    Methods  Explanation;Demonstration    Comprehension  Verbalized understanding;Returned  demonstration          PT Long Term Goals - 08/26/17 1322      PT LONG TERM GOAL #1   Title  Patient will be independent with HEP to continue benefits of therapy after discharge.    Baseline  Requires moderate cueing with performance of exercise    Time  8    Period  Weeks    Status  On-going      PT LONG TERM GOAL #2   Title  Patient will improve TUG to under 12 seconds to indicate signficant improvement in fall risk.     Baseline  TUG: 24sec; 03/26/2017: TUG: 22sec; 05/12/17: 21.7 sec; 05/14/2017: 19sec; 08/26/2016: 14.75    Time  8    Period  Weeks    Status  On-going      PT LONG TERM GOAL #3   Title  Patient will improve 73minWT to over 1048ft to indicate functional improvement in walking tolerance and greater ability to walk in stores before requiring a sitting rest break    Baseline  436ft; 03/26/2017: 573ft; 05/12/17: 416ft (primary deficit is DOE); 07/14/17: 448ft DOE; 08/26/2017: 312ft    Time  8     Period  Weeks    Status  On-going      PT LONG TERM GOAL #4   Title  Patient will demonstrate ability to stand for over 2 min to allow for improved ability to perform household chores at home.     Baseline  able to stand for 50sec before requiring a sitting rest break; 03/26/2017: 60 sec before requiring a break; 05/12/17: 60 seconds per pt subjective report; 07/14/17: 30sec 08/26/2017: 5    Time  8    Period  Weeks    Status  On-going      PT LONG TERM GOAL #5   Title  Pt will decrease 5TSTS to <12 seconds from a chair at 19in in height in order to demonstrate clinically significant improvement in LE strength    Baseline  5xSTS: 14 sec from 22" height chair; 03/26/2017: 12.5sec; 05/12/17: 13.4 sec; 07/14/17: Deffered; 08/26/2017: 21sec from 21" height    Time  8    Period  Weeks    Status  On-going            Plan - 10/07/17 1326    Clinical Impression Statement  Patient demonstrates increased fatigue with performing walking and standing based exercises indicating poor balance and strength. Patient demonstrates improvement with ability to perform greater amount of step ups compared to the previous visit at the same height indicating functional improvement with LE strength. Patient will benefit from further skilled therapy to return to prior level of function.     Rehab Potential  Fair    Clinical Impairments Affecting Rehab Potential  Positive: motivation; Negative: age, chronicity, bilateral knee pain    PT Frequency  2x / week    PT Duration  8 weeks    PT Treatment/Interventions  ADLs/Self Care Home Management;Aquatic Therapy;Canalith Repostioning;Cryotherapy;Electrical Stimulation;Iontophoresis 4mg /ml Dexamethasone;Moist Heat;Traction;Ultrasound;DME Instruction;Gait training;Stair training;Functional mobility training;Therapeutic activities;Therapeutic exercise;Balance training;Neuromuscular re-education;Patient/family education;Wheelchair mobility training;Manual techniques;Passive  range of motion;Vestibular    PT Next Visit Plan  Progress strengthening, reinforce HEP    PT Home Exercise Plan  Cervical retraction    Consulted and Agree with Plan of Care  Patient;Family member/caregiver    Family Member Consulted  Daughter       Patient will benefit from skilled therapeutic intervention in order to improve  the following deficits and impairments:  Abnormal gait, Decreased strength, Obesity, Decreased activity tolerance, Decreased endurance, Difficulty walking, Decreased balance, Decreased mobility, Decreased coordination, Increased muscle spasms, Postural dysfunction, Hypomobility, Decreased range of motion  Visit Diagnosis: Muscle weakness (generalized)  Difficulty in walking, not elsewhere classified  Unsteadiness on feet     Problem List Patient Active Problem List   Diagnosis Date Noted  . Microcytic red blood cells 09/23/2017  . Localized edema 02/13/2017  . Hemorrhoids 05/17/2016  . Obesity (BMI 30-39.9) 04/26/2016  . Multiple lung nodules 04/19/2016  . Chest pain 04/17/2016  . Partial symptomatic epilepsy with complex partial seizures, not intractable, without status epilepticus ([place]) 06/30/2015  . Bronchiectasis without complication ([place]) 81/59/4707  . Imbalance 11/08/2014  . Essential (primary) hypertension 11/08/2014  . Personal history of malignant neoplasm of breast 11/08/2014  . History of malignant carcinoid tumor of bronchus and lung 11/08/2014  . H/O peptic ulcer 11/08/2014  . Asthma, mild intermittent 11/08/2014  . PE (pulmonary embolism) 11/08/2014  . Gonalgia 11/08/2014  . Leg varices 11/08/2014    Blythe Stanford, PT DPT 10/07/2017, 2:02 PM  [facility] [address] [place], [address] Phone: [phone]   Fax:  [phone]  Name: PRUDENCE HEINY MRN: [id number] Date of Birth: Oct 15, 1924

## 2017-10-10 ENCOUNTER — Ambulatory Visit (INDEPENDENT_AMBULATORY_CARE_PROVIDER_SITE_OTHER): Payer: Medicare Other | Admitting: Podiatry

## 2017-10-10 ENCOUNTER — Encounter: Payer: Self-pay | Admitting: Podiatry

## 2017-10-10 DIAGNOSIS — B351 Tinea unguium: Secondary | ICD-10-CM

## 2017-10-10 DIAGNOSIS — M79676 Pain in unspecified toe(s): Secondary | ICD-10-CM | POA: Diagnosis not present

## 2017-10-13 NOTE — Progress Notes (Signed)
   SUBJECTIVE Patient presents to office today complaining of elongated, thickened nails that cause pain while ambulating in shoes. She is unable to trim her own nails. Patient is here for further evaluation and treatment.  Past Medical History:  Diagnosis Date  . Asthma   . Breast cancer (HCC) 2003   LT LUMPECTOMY  . Breast cancer (HCC) 2004   LT LUMPECTOMY  . 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  . GERD (gastroesophageal reflux disease)   . Hemorrhoids   . Hypertension   . Personal history of chemotherapy 2004   BREAST CA  . Personal history of radiation therapy 2003   BREAST CA  . Personal history of radiation therapy 2004   BREAST CA  . Polyp of colon 2002   bleeding polyp of right ascending colon  . PUD (peptic ulcer disease)   . Seizures (HCC)    epilepsy well controlled    OBJECTIVE General Patient is awake, alert, and oriented x 3 and in no acute distress. Derm Skin is dry and supple bilateral. Negative open lesions or macerations. Remaining integument unremarkable. Nails are tender, long, thickened and dystrophic with subungual debris, consistent with onychomycosis, 1-5 bilateral. No signs of infection noted. Vasc  DP and PT pedal pulses palpable bilaterally. Temperature gradient within normal limits.  Neuro Epicritic and protective threshold sensation grossly intact bilaterally.  Musculoskeletal Exam No symptomatic pedal deformities noted bilateral. Muscular strength within normal limits.  ASSESSMENT 1. Onychodystrophic nails 1-5 bilateral with hyperkeratosis of nails.  2. Onychomycosis of nail due to dermatophyte bilateral 3. Pain in foot bilateral  PLAN OF CARE 1. Patient evaluated today.  2. Instructed to maintain good pedal hygiene and foot care.  3. Mechanical debridement of nails 1-5 bilaterally performed using a nail nipper. Filed with dremel without incident.  4. Return to clinic in 3 mos.    Quinlin Conant M. Neeraj Housand, DPM [facility]  Dr. Cody Albus M. Kymberly Blomberg, DPM    [address]                                        [place], [place] [address]                Office [phone]  Fax [phone]     

## 2017-10-14 ENCOUNTER — Ambulatory Visit: Payer: Medicare Other

## 2017-10-14 DIAGNOSIS — R2681 Unsteadiness on feet: Secondary | ICD-10-CM

## 2017-10-14 DIAGNOSIS — M6281 Muscle weakness (generalized): Secondary | ICD-10-CM | POA: Diagnosis not present

## 2017-10-14 DIAGNOSIS — R262 Difficulty in walking, not elsewhere classified: Secondary | ICD-10-CM | POA: Diagnosis not present

## 2017-10-14 NOTE — Therapy (Signed)
[facility] [address] [place], [address] Phone: [phone]   Fax:  [phone]  Physical Therapy Treatment/progress note  Physical Therapy Progress Note   Dates of reporting period  08/28/2017   to   10/14/2017  Patient Details  Name: Rebecca Wolf MRN: [id number] Date of Birth: September 04, 1924 Referring Provider: Dr. Sharlet Salina   Encounter Date: 10/14/2017  PT End of Session - 10/14/17 1342    Visit Number  29    Number of Visits  43    Date for PT Re-Evaluation  11/25/17    Authorization Type  Progress note: Today    PT Start Time  1300    PT Stop Time  1345    PT Time Calculation (min)  45 min    Equipment Utilized During Treatment  Gait belt    Activity Tolerance  Patient tolerated treatment well    Behavior During Therapy  WFL for tasks assessed/performed       Past Medical History:  Diagnosis Date  . Asthma   . Breast cancer ([place]) 2003   LT LUMPECTOMY  . Breast cancer ([facility]) 2004   LT LUMPECTOMY  . 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  . GERD (gastroesophageal reflux disease)   . Hemorrhoids   . Hypertension   . Personal history of chemotherapy 2004   BREAST CA  . Personal history of radiation therapy 2003   BREAST CA  . Personal history of radiation therapy 2004   BREAST CA  . Polyp of colon 2002   bleeding polyp of right ascending colon  . PUD (peptic ulcer disease)   . Seizures ([place])    epilepsy well controlled    Past Surgical History:  Procedure Laterality Date  . BREAST EXCISIONAL BIOPSY Left 2003   positive  . BREAST EXCISIONAL BIOPSY Left 2004   positive  . BREAST LUMPECTOMY Left 2003   BREAST CA  . BREAST LUMPECTOMY Left 2004   BREAST CA  . HEMICOLECTOMY Right    bowel obstruction  . HEMORRHOID SURGERY    . THORACOTOMY/LOBECTOMY  1999   carcinoid tumor  . TOTAL VAGINAL HYSTERECTOMY    . VARICOSE VEIN SURGERY      There were no vitals filed for this  visit.  Subjective Assessment - 10/14/17 1340    Subjective  Patient reports her knee is feeling better since the previous session. Patient states she has not been performing her HEP but feels stronger and reports without therapy, she would not be able to walk arund her home.     Pertinent History  Previously seen for balance disorders and for neck pain over the past year. Pt reports R knee feels sore today but is not painful.    Limitations  Walking;Sitting    How long can you walk comfortably?  3 min     Diagnostic tests  None    Patient Stated Goals  To be able to walk without an walker.     Currently in Pain?  No/denies         TREATMENT: Therapeutic Exercise Ambulation with focus on increasing speed and step length -2 x 253ft with therapist guarding and rollator  Standing balance without UE support - 2 x 40sec  Nustep with use of LE and UE use on Nustep -- x 5 min level 5, x29min level 2 Sit to stands at 19" height - 2 x 5 with UE support Sit to stand with  ambulation - 2 x 70ft with rollator Step ups onto 6" step - x5 B   Patient demonstrates increased fatigue at the end of the session      PT Education - 10/14/17 1342    Education provided  Yes    Education Details  continue walking program at home    Person(s) Educated  Patient    Methods  Explanation;Demonstration    Comprehension  Verbalized understanding;Returned demonstration          PT Long Term Goals - 10/14/17 1314      PT LONG TERM GOAL #1   Title  Patient will be independent with HEP to continue benefits of therapy after discharge.    Baseline  Requires moderate cueing with performance of exercise    Time  8    Period  Weeks    Status  On-going      PT LONG TERM GOAL #2   Title  Patient will improve TUG to under 12 seconds to indicate signficant improvement in fall risk.     Baseline  TUG: 24sec; 03/26/2017: TUG: 22sec; 05/12/17: 21.7 sec; 05/14/2017: 19sec; 08/26/2016: 14.75; 10/14/2017: 18sec     Time  8    Period  Weeks    Status  On-going      PT LONG TERM GOAL #3   Title  Patient will improve 2minWT to over 1055ft to indicate functional improvement in walking tolerance and greater ability to walk in stores before requiring a sitting rest break    Baseline  461ft; 03/26/2017: 582ft; 05/12/17: 455ft (primary deficit is DOE); 07/14/17: 448ft DOE; 08/26/2017: 366ft; 10/14/2017: 460ft     Time  8    Period  Weeks    Status  On-going      PT LONG TERM GOAL #4   Title  Patient will demonstrate ability to stand for over 2 min to allow for improved ability to perform household chores at home.     Baseline  able to stand for 50sec before requiring a sitting rest break; 03/26/2017: 60 sec before requiring a break; 05/12/17: 60 seconds per pt subjective report; 07/14/17: 30sec 08/26/2017: 50; 10/14/2017: 10/14/2017: 60sec      Time  8    Period  Weeks    Status  On-going      PT LONG TERM GOAL #5   Title  Pt will decrease 5TSTS to <12 seconds from a chair at 19in in height in order to demonstrate clinically significant improvement in LE strength    Baseline  5xSTS: 14 sec from 22" height chair; 03/26/2017: 12.5sec; 05/12/17: 13.4 sec; 07/14/17: Deffered; 08/26/2017: 21sec from 21" height; 10/14/2017: 19" height 19 sec     Time  8    Period  Weeks    Status  On-going            Plan - 10/14/17 1343    Clinical Impression Statement  Patient demonstrates improvement with ability to perform a decreased time with sit to stands and increased stance time with measuring. Patient demonstrates relativialy unchanged ability to perform 39min walk with ability to perform 430ft. However patient was able to perform 252ft before requiring a sitting rest break of ambulation which is an improvement compared to previous progress note. Patient continues to benefit poor endurance and cardiovascular ability and patient will benefit from further skilled therapy.     Rehab Potential  Fair    Clinical Impairments  Affecting Rehab Potential  Positive: motivation; Negative: age, chronicity, bilateral knee pain  PT Frequency  2x / week    PT Duration  8 weeks    PT Treatment/Interventions  ADLs/Self Care Home Management;Aquatic Therapy;Canalith Repostioning;Cryotherapy;Electrical Stimulation;Iontophoresis 4mg /ml Dexamethasone;Moist Heat;Traction;Ultrasound;DME Instruction;Gait training;Stair training;Functional mobility training;Therapeutic activities;Therapeutic exercise;Balance training;Neuromuscular re-education;Patient/family education;Wheelchair mobility training;Manual techniques;Passive range of motion;Vestibular    PT Next Visit Plan  Progress strengthening, reinforce HEP    PT Home Exercise Plan  Cervical retraction    Consulted and Agree with Plan of Care  Patient;Family member/caregiver    Family Member Consulted  Daughter       Patient will benefit from skilled therapeutic intervention in order to improve the following deficits and impairments:  Abnormal gait, Decreased strength, Obesity, Decreased activity tolerance, Decreased endurance, Difficulty walking, Decreased balance, Decreased mobility, Decreased coordination, Increased muscle spasms, Postural dysfunction, Hypomobility, Decreased range of motion  Visit Diagnosis: Muscle weakness (generalized)  Difficulty in walking, not elsewhere classified  Unsteadiness on feet     Problem List Patient Active Problem List   Diagnosis Date Noted  . Microcytic red blood cells 09/23/2017  . Localized edema 02/13/2017  . Hemorrhoids 05/17/2016  . Obesity (BMI 30-39.9) 04/26/2016  . Multiple lung nodules 04/19/2016  . Chest pain 04/17/2016  . Partial symptomatic epilepsy with complex partial seizures, not intractable, without status epilepticus ([place]) 06/30/2015  . Bronchiectasis without complication ([place]) 24/46/2863  . Imbalance 11/08/2014  . Essential (primary) hypertension 11/08/2014  . Personal history of malignant neoplasm of breast  11/08/2014  . History of malignant carcinoid tumor of bronchus and lung 11/08/2014  . H/O peptic ulcer 11/08/2014  . Asthma, mild intermittent 11/08/2014  . PE (pulmonary embolism) 11/08/2014  . Gonalgia 11/08/2014  . Leg varices 11/08/2014    Blythe Stanford, PT DPT 10/14/2017, 1:47 PM  [facility] [address], [place], [address] Phone: [phone]   Fax:  [phone]  Name: Rebecca Wolf MRN: [id number] Date of Birth: September 16, 1924

## 2017-10-20 ENCOUNTER — Other Ambulatory Visit: Payer: Self-pay | Admitting: *Deleted

## 2017-10-20 ENCOUNTER — Telehealth: Payer: Self-pay | Admitting: *Deleted

## 2017-10-20 NOTE — Telephone Encounter (Signed)
-----   Message from Wilburn Cornelia sent at 10/20/2017  1:58 PM EDT ----- Regarding: MM questions for tomorrows appt Contact: [phone] Pt has questions about MM tomorrow-thx!

## 2017-10-20 NOTE — Telephone Encounter (Signed)
Attempted to call patient no answer

## 2017-10-21 ENCOUNTER — Ambulatory Visit
Admission: RE | Admit: 2017-10-21 | Discharge: 2017-10-21 | Disposition: A | Discharge status: Home / Self Care | Payer: Medicare Other | Source: Ambulatory Visit | Attending: Urgent Care | Admitting: Urgent Care

## 2017-10-21 ENCOUNTER — Ambulatory Visit: Payer: Medicare Other

## 2017-10-21 ENCOUNTER — Telehealth: Payer: Self-pay | Admitting: *Deleted

## 2017-10-21 DIAGNOSIS — Z1231 Encounter for screening mammogram for malignant neoplasm of breast: Secondary | ICD-10-CM | POA: Diagnosis not present

## 2017-10-21 DIAGNOSIS — Z853 Personal history of malignant neoplasm of breast: Secondary | ICD-10-CM | POA: Diagnosis not present

## 2017-10-21 NOTE — Telephone Encounter (Signed)
-----   Message from Lequita Asal, MD sent at 10/21/2017  1:10 PM EDT ----- Regarding: RE: MM questions for tomorrows appt Contact: [phone]  Do we have any alternate numbers.  M ----- Message ----- From: Shirlean Kelly, RN Sent: 10/21/2017   9:35 AM To: Lequita Asal, MD Subject: RE: MM questions for tomorrows appt            I could not get her on the phone.  No voice mail box available. ----- Message ----- From: Lequita Asal, MD Sent: 10/21/2017   5:50 AM To: Shirlean Kelly, RN Subject: RE: MM questions for tomorrows appt             Do you know if her questions were answered?  I think MM is a mammogram.  M  ----- Message ----- From: Wilburn Cornelia Sent: 10/20/2017   1:58 PM To: Lequita Asal, MD, Shirlean Kelly, RN Subject: MM questions for tomorrows appt                Pt has questions about MM tomorrow-thx!

## 2017-10-21 NOTE — Telephone Encounter (Signed)
Called patient's daughter on her mobile number.  Number changed or disconnected.  Called patient's home and daughter, [name] answered phone.  States her mother's question was whether or not she needed the mammogram.  Daughter told her yes she did so she went for mammo.

## 2017-10-28 ENCOUNTER — Ambulatory Visit: Payer: Medicare Other

## 2017-10-28 ENCOUNTER — Other Ambulatory Visit: Payer: Self-pay | Admitting: Internal Medicine

## 2017-10-28 DIAGNOSIS — R262 Difficulty in walking, not elsewhere classified: Secondary | ICD-10-CM

## 2017-10-28 DIAGNOSIS — R2681 Unsteadiness on feet: Secondary | ICD-10-CM | POA: Diagnosis not present

## 2017-10-28 DIAGNOSIS — M6281 Muscle weakness (generalized): Secondary | ICD-10-CM | POA: Diagnosis not present

## 2017-10-28 NOTE — Therapy (Signed)
[facility] [address] [place], [address] Phone: [phone]   Fax:  [phone]  Physical Therapy Treatment  Patient Details  Name: Rebecca Wolf MRN: [id number] Date of Birth: 09/12/1924 Referring Provider: Dr. Sharlet Salina   Encounter Date: 10/28/2017  PT End of Session - 10/28/17 1328    Visit Number  30    Number of Visits  43    Date for PT Re-Evaluation  11/25/17    Authorization Type  Progress note: 1/10    PT Start Time  1300    PT Stop Time  1345    PT Time Calculation (min)  45 min    Equipment Utilized During Treatment  Gait belt    Activity Tolerance  Patient tolerated treatment well    Behavior During Therapy  [facility] for tasks assessed/performed       Past Medical History:  Diagnosis Date  . Asthma   . Breast cancer ([place]) 2003   LT LUMPECTOMY  . Breast cancer ([place]) 2004   LT LUMPECTOMY  . 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  . GERD (gastroesophageal reflux disease)   . Hemorrhoids   . Hypertension   . Personal history of chemotherapy 2004   BREAST CA  . Personal history of radiation therapy 2003   BREAST CA  . Personal history of radiation therapy 2004   BREAST CA  . Polyp of colon 2002   bleeding polyp of right ascending colon  . PUD (peptic ulcer disease)   . Seizures ([place])    epilepsy well controlled    Past Surgical History:  Procedure Laterality Date  . BREAST EXCISIONAL BIOPSY Left 2003   positive  . BREAST EXCISIONAL BIOPSY Left 2004   positive  . BREAST LUMPECTOMY Left 2003   BREAST CA  . BREAST LUMPECTOMY Left 2004   BREAST CA  . HEMICOLECTOMY Right    bowel obstruction  . HEMORRHOID SURGERY    . THORACOTOMY/LOBECTOMY  1999   carcinoid tumor  . TOTAL VAGINAL HYSTERECTOMY    . VARICOSE VEIN SURGERY      There were no vitals filed for this visit.  Subjective Assessment - 10/28/17 1327    Subjective  Patient reports her knee continues to feel  better, patient states she is feeling overall tired today.     Pertinent History  Previously seen for balance disorders and for neck pain over the past year. Pt reports R knee feels sore today but is not painful.    Limitations  Walking;Sitting    How long can you walk comfortably?  3 min     Diagnostic tests  None    Patient Stated Goals  To be able to walk without an walker.     Currently in Pain?  No/denies       TREATMENT: Therapeutic Exercise Nustep with use of LE and UE use on Nustep -- x 7 min level 4, Ambulation with focus on increasing speed and step length -2 x 292ft with therapist guarding  Side stepping across airex beam - x8 ~37ft Heel raises in standing - x 15 Hip abduction in standing with UE support - x 15 Mini squats in standing with UE support - x 15   Patient demonstrates increased fatigue at the end of the session    PT Education - 10/28/17 1328    Education provided  Yes    Education Details  form/technique with exercise  Person(s) Educated  Patient    Methods  Explanation;Demonstration    Comprehension  Verbalized understanding;Returned demonstration          PT Long Term Goals - 10/14/17 1314      PT LONG TERM GOAL #1   Title  Patient will be independent with HEP to continue benefits of therapy after discharge.    Baseline  Requires moderate cueing with performance of exercise    Time  8    Period  Weeks    Status  On-going      PT LONG TERM GOAL #2   Title  Patient will improve TUG to under 12 seconds to indicate signficant improvement in fall risk.     Baseline  TUG: 24sec; 03/26/2017: TUG: 22sec; 05/12/17: 21.7 sec; 05/14/2017: 19sec; 08/26/2016: 14.75; 10/14/2017: 18sec    Time  8    Period  Weeks    Status  On-going      PT LONG TERM GOAL #3   Title  Patient will improve 40minWT to over 1059ft to indicate functional improvement in walking tolerance and greater ability to walk in stores before requiring a sitting rest break    Baseline   447ft; 03/26/2017: 538ft; 05/12/17: 479ft (primary deficit is DOE); 07/14/17: 448ft DOE; 08/26/2017: 387ft; 10/14/2017: 452ft     Time  8    Period  Weeks    Status  On-going      PT LONG TERM GOAL #4   Title  Patient will demonstrate ability to stand for over 2 min to allow for improved ability to perform household chores at home.     Baseline  able to stand for 50sec before requiring a sitting rest break; 03/26/2017: 60 sec before requiring a break; 05/12/17: 60 seconds per pt subjective report; 07/14/17: 30sec 08/26/2017: 50; 10/14/2017: 10/14/2017: 60sec      Time  8    Period  Weeks    Status  On-going      PT LONG TERM GOAL #5   Title  Pt will decrease 5TSTS to <12 seconds from a chair at 19in in height in order to demonstrate clinically significant improvement in LE strength    Baseline  5xSTS: 14 sec from 22" height chair; 03/26/2017: 12.5sec; 05/12/17: 13.4 sec; 07/14/17: Deffered; 08/26/2017: 21sec from 21" height; 10/14/2017: 19" height 19 sec     Time  8    Period  Weeks    Status  On-going            Plan - 10/28/17 1329    Clinical Impression Statement  Patient demonstrates increased fatigue today with exercises and required greatersitting rest breaks today compared to previous sessions. Patient demosntrates ability to ambulate 22ft with therpist support but does not require use of rollator indicating funcitonal improvement between treatment sessions. Patient will benefit from further skilled therapy to return to prior level of function.     Rehab Potential  Fair    Clinical Impairments Affecting Rehab Potential  Positive: motivation; Negative: age, chronicity, bilateral knee pain    PT Frequency  2x / week    PT Duration  8 weeks    PT Treatment/Interventions  ADLs/Self Care Home Management;Aquatic Therapy;Canalith Repostioning;Cryotherapy;Electrical Stimulation;Iontophoresis 4mg /ml Dexamethasone;Moist Heat;Traction;Ultrasound;DME Instruction;Gait training;Stair training;Functional  mobility training;Therapeutic activities;Therapeutic exercise;Balance training;Neuromuscular re-education;Patient/family education;Wheelchair mobility training;Manual techniques;Passive range of motion;Vestibular    PT Next Visit Plan  Progress strengthening, reinforce HEP    PT Home Exercise Plan  Cervical retraction    Consulted and Agree with Plan of  Care  Patient;Family member/caregiver    Family Member Consulted  Daughter       Patient will benefit from skilled therapeutic intervention in order to improve the following deficits and impairments:  Abnormal gait, Decreased strength, Obesity, Decreased activity tolerance, Decreased endurance, Difficulty walking, Decreased balance, Decreased mobility, Decreased coordination, Increased muscle spasms, Postural dysfunction, Hypomobility, Decreased range of motion  Visit Diagnosis: Muscle weakness (generalized)  Difficulty in walking, not elsewhere classified  Unsteadiness on feet     Problem List Patient Active Problem List   Diagnosis Date Noted  . Microcytic red blood cells 09/23/2017  . Localized edema 02/13/2017  . Hemorrhoids 05/17/2016  . Obesity (BMI 30-39.9) 04/26/2016  . Multiple lung nodules 04/19/2016  . Chest pain 04/17/2016  . Partial symptomatic epilepsy with complex partial seizures, not intractable, without status epilepticus ([place]) 06/30/2015  . Bronchiectasis without complication ([place]) 68/01/8109  . Imbalance 11/08/2014  . Essential (primary) hypertension 11/08/2014  . Personal history of malignant neoplasm of breast 11/08/2014  . History of malignant carcinoid tumor of bronchus and lung 11/08/2014  . H/O peptic ulcer 11/08/2014  . Asthma, mild intermittent 11/08/2014  . PE (pulmonary embolism) 11/08/2014  . Gonalgia 11/08/2014  . Leg varices 11/08/2014    Blythe Stanford, PT DPT 10/28/2017, 1:48 PM  [facility] [address], [place],  [address] Phone: [phone]   Fax:  [phone]  Name: Rebecca Wolf MRN: [id number] Date of Birth: 1924-10-14

## 2017-10-30 ENCOUNTER — Encounter: Payer: Self-pay | Admitting: Obstetrics & Gynecology

## 2017-10-30 ENCOUNTER — Ambulatory Visit (INDEPENDENT_AMBULATORY_CARE_PROVIDER_SITE_OTHER): Payer: Medicare Other | Admitting: Obstetrics & Gynecology

## 2017-10-30 VITALS — BP 120/70 | Ht 63.0 in | Wt 172.0 lb

## 2017-10-30 DIAGNOSIS — N8110 Cystocele, unspecified: Secondary | ICD-10-CM | POA: Insufficient documentation

## 2017-10-30 DIAGNOSIS — N816 Rectocele: Secondary | ICD-10-CM

## 2017-10-30 DIAGNOSIS — N8111 Cystocele, midline: Secondary | ICD-10-CM | POA: Diagnosis not present

## 2017-10-30 NOTE — Progress Notes (Signed)
Cystocele/Rectocele Patient complains of a cystocele and rectocele. Problem started a few years ago. Symptoms include: prolapse of tissue with straining and impaired defecation: mild. Symptoms have gradually worsened. Mostly vaginal pressure.  Rebecca Wolf Rebecca Wolf Has frequent diarrhea, sees GI for this  PMHx: She  has a past medical history of Asthma, Breast cancer ([place]) (2003), Breast cancer ([place]) (2004), Carcinoid tumor determined by biopsy of lung (2001), GERD (gastroesophageal reflux disease), Hemorrhoids, Hypertension, Personal history of chemotherapy (2004), Personal history of radiation therapy (2003), Personal history of radiation therapy (2004), Polyp of colon (2002), PUD (peptic ulcer disease), and Seizures (Rebecca Wolf). Also,  has a past surgical history that includes Thoracotomy/lobectomy (1999); Hemicolectomy (Right); Total vaginal hysterectomy; Varicose vein surgery; Hemorrhoid surgery; Breast excisional biopsy (Left, 2003); Breast excisional biopsy (Left, 2004); Breast lumpectomy (Left, 2003); and Breast lumpectomy (Left, 2004)., family history includes Alcoholism in her father; Diabetes in her sister.,  reports that she has quit smoking. She has never used smokeless tobacco. She reports that she does not drink alcohol or use drugs.  She has a current medication list which includes the following prescription(s): acetaminophen, albuterol, amlodipine, cefuroxime, diltiazem, eliquis, fluticasone, fluticasone-salmeterol, fluticasone-salmeterol, levetiracetam, montelukast, olopatadine hcl, pantoprazole, spironolactone-hydrochlorothiazide, and sucralfate. Also, is allergic to levofloxacin and sulfa antibiotics.  Review of Systems  Constitutional: Negative for chills, fever and malaise/fatigue.  HENT: Negative for congestion, sinus pain and sore throat.   Eyes: Negative for blurred vision and pain.  Respiratory: Negative for cough and wheezing.   Cardiovascular: Negative for chest pain and leg swelling.    Gastrointestinal: Positive for diarrhea. Negative for abdominal pain, constipation, heartburn, nausea and vomiting.  Genitourinary: Negative for dysuria, frequency, hematuria and urgency.  Musculoskeletal: Negative for back pain, joint pain, myalgias and neck pain.  Skin: Negative for itching and rash.  Neurological: Negative for dizziness, tremors and weakness.  Endo/Heme/Allergies: Does not bruise/bleed easily.  Psychiatric/Behavioral: Negative for depression. The patient is not nervous/anxious and does not have insomnia.    Objective: BP 120/70   Ht 5\' 3"  (1.6 m)   Wt 172 lb (78 kg)   BMI 30.47 kg/m  Physical Exam  Constitutional: She is oriented to person, place, and time. She appears well-developed and well-nourished. No distress.  Genitourinary: Vagina normal. Pelvic exam was performed with patient supine. There is no rash, tenderness or lesion on the right labia. There is no rash, tenderness or lesion on the left labia. No erythema or bleeding in the vagina.  Genitourinary Comments: Cuff intact/ no lesions Absent uterus and cervix Cystocele Gr 1 Rectocele Gr 2 Vaginal vault prolapse Gr 1  Abdominal: Soft. She exhibits no distension. There is no tenderness.  Musculoskeletal: Normal range of motion.  Neurological: She is alert and oriented to person, place, and time. No cranial nerve deficit.  Skin: Skin is warm and dry.  Psychiatric: She has a normal mood and affect.   ASSESSMENT/PLAN:   Problem List Items Addressed This Visit      Digestive   Rectocele     Genitourinary   Cystocele, midline - Primary    Pessary Fitting Patient presents for a pessary fitting. She desires a pessary as her means of controlling her symptoms of prolapse and/or urinary incontinence. She understands the care needed for a pessary and desires to proceed. Alternative treatment options have been discussed at length and the patient voices an understanding of each option. Surgery  discussed.  PROCEDURE: The patient was placed in dorsal lithotomy position. Examination confirmed prolapse. A 3 ring pessary was  fitted without difficulty. The patient subsequently ambulated, voided and performed valsalva maneuvers without discomfort.  Expulsion experienced.  Care instructions were provided. Patient was discharged to home in stable condition.   Plan is to order correctly sized Gellhorn pessary to help minimize future expulsion.  Will call and arrange appt when arrives.  Rebecca Applebaum, MD, [facility], [facility] 10/30/2017  11:49 AM

## 2017-10-30 NOTE — Patient Instructions (Signed)
Pelvic Organ Prolapse Pelvic organ prolapse is the stretching, bulging, or dropping of pelvic organs into an abnormal position. It happens when the muscles and tissues that surround and support pelvic structures are stretched or weak. Pelvic organ prolapse can involve:  Vagina (vaginal prolapse).  Uterus (uterine prolapse).  Bladder (cystocele).  Rectum (rectocele).  Intestines (enterocele).  When organs other than the vagina are involved, they often bulge into the vagina or protrude from the vagina, depending on how severe the prolapse is. What are the causes? Causes of this condition include:  Pregnancy, labor, and childbirth.  Long-lasting (chronic) cough.  Chronic constipation.  Obesity.  Past pelvic surgery.  Aging. During and after menopause, a decreased production of the hormone estrogen can weaken pelvic ligaments and muscles.  Consistently lifting more than 50 lb (23 kg).  Buildup of fluid in the abdomen due to certain diseases and other conditions.  What are the signs or symptoms? Symptoms of this condition include:  Loss of bladder control when you cough, sneeze, strain, and exercise (stress incontinence). This may be worse immediately following childbirth, and it may gradually improve over time.  Feeling pressure in your pelvis or vagina. This pressure may increase when you cough or when you are having a bowel movement.  A bulge that protrudes from the opening of your vagina or against your vaginal wall. If your uterus protrudes through the opening of your vagina and rubs against your clothing, you may also experience soreness, ulcers, infection, pain, and bleeding.  Increased effort to have a bowel movement or urinate.  Pain in your low back.  Pain, discomfort, or disinterest in sexual intercourse.  Repeated bladder infections (urinary tract infections).  Difficulty inserting or inability to insert a tampon or applicator.  In some people, this  condition does not cause any symptoms. How is this diagnosed? Your health care provider may perform an internal and external vaginal and rectal exam. During the exam, you may be asked to cough and strain while you are lying down, sitting, and standing up. Your health care provider will determine if other tests are required, such as bladder function tests. How is this treated? In most cases, this condition needs to be treated only if it produces symptoms. No treatment is guaranteed to correct the prolapse or relieve the symptoms completely. Treatment may include:  Lifestyle changes, such as: ? Avoiding drinking beverages that contain caffeine. ? Increasing your intake of high-fiber foods. This can help to decrease constipation and straining during bowel movements. ? Emptying your bladder at scheduled times (bladder training therapy). This can help to reduce or avoid urinary incontinence. ? Losing weight if you are overweight or obese.  Estrogen. Estrogen may help mild prolapse by increasing the strength and tone of pelvic floor muscles.  Kegel exercises. These may help mild cases of prolapse by strengthening and tightening the muscles of the pelvic floor.  Pessary insertion. A pessary is a soft, flexible device that is placed into your vagina by your health care provider to help support the vaginal walls and keep pelvic organs in place.  Surgery. This is often the only form of treatment for severe prolapse. Different types of surgeries are available.  Follow these instructions at home:  Wear a sanitary pad or absorbent product if you have urinary incontinence.  Avoid heavy lifting and straining with exercise and work. Do not hold your breath when you perform mild to moderate lifting and exercise activities. Limit your activities as directed by your health care   provider.  Take medicines only as directed by your health care provider.  Perform Kegel exercises as directed by your health care  provider.  If you have a pessary, take care of it as directed by your health care provider. Contact a health care provider if:  Your symptoms interfere with your daily activities or sex life.  You need medicine to help with the discomfort.  You notice bleeding from the vagina that is not related to your period.  You have a fever.  You have pain or bleeding when you urinate.  You have bleeding when you have a bowel movement.  You lose urine when you have sex.  You have chronic constipation.  You have a pessary that falls out.  You have vaginal discharge that has a bad smell.  You have low abdominal pain or cramping that is unusual for you. This information is not intended to replace advice given to you by your health care provider. Make sure you discuss any questions you have with your health care provider. Document Released: 01/12/2014 Document Revised: 11/23/2015 Document Reviewed: 08/30/2013 Elsevier Interactive Patient Education  2018 [employer]  

## 2017-11-04 ENCOUNTER — Ambulatory Visit: Payer: Medicare Other | Attending: Physical Medicine and Rehabilitation

## 2017-11-04 DIAGNOSIS — R262 Difficulty in walking, not elsewhere classified: Secondary | ICD-10-CM | POA: Insufficient documentation

## 2017-11-04 DIAGNOSIS — R2681 Unsteadiness on feet: Secondary | ICD-10-CM | POA: Diagnosis not present

## 2017-11-04 DIAGNOSIS — M6281 Muscle weakness (generalized): Secondary | ICD-10-CM | POA: Diagnosis not present

## 2017-11-04 NOTE — Therapy (Signed)
[facility] [address], [place], [address] Phone: [phone]   Fax:  [phone]  Physical Therapy Treatment  Patient Details  Name: Rebecca Wolf MRN: [id number] Date of Birth: Oct 14, 1924 Referring Provider: Dr. Sharlet Salina   Encounter Date: 11/04/2017  PT End of Session - 11/04/17 1333    Visit Number  31    Number of Visits  43    Date for PT Re-Evaluation  11/25/17    Authorization Type  Progress note: 1/10    PT Start Time  1310    PT Stop Time  1345    PT Time Calculation (min)  35 min    Equipment Utilized During Treatment  Gait belt    Activity Tolerance  Patient tolerated treatment well    Behavior During Therapy  [facility] for tasks assessed/performed       Past Medical History:  Diagnosis Date  . Asthma   . Breast cancer ([place]) 2003   LT LUMPECTOMY  . Breast cancer ([place]) 2004   LT LUMPECTOMY  . 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  . GERD (gastroesophageal reflux disease)   . Hemorrhoids   . Hypertension   . Personal history of chemotherapy 2004   BREAST CA  . Personal history of radiation therapy 2003   BREAST CA  . Personal history of radiation therapy 2004   BREAST CA  . Polyp of colon 2002   bleeding polyp of right ascending colon  . PUD (peptic ulcer disease)   . Seizures ([place])    epilepsy well controlled    Past Surgical History:  Procedure Laterality Date  . BREAST EXCISIONAL BIOPSY Left 2003   positive  . BREAST EXCISIONAL BIOPSY Left 2004   positive  . BREAST LUMPECTOMY Left 2003   BREAST CA  . BREAST LUMPECTOMY Left 2004   BREAST CA  . HEMICOLECTOMY Right    bowel obstruction  . HEMORRHOID SURGERY    . THORACOTOMY/LOBECTOMY  1999   carcinoid tumor  . TOTAL VAGINAL HYSTERECTOMY    . VARICOSE VEIN SURGERY      There were no vitals filed for this visit.  Subjective Assessment - 11/04/17 1331    Subjective  Patient reports no major changes since the  beginning of the session. Patient reports she is worried about her son's health.     Pertinent History  Previously seen for balance disorders and for neck pain over the past year. Pt reports R knee feels sore today but is not painful.    Limitations  Walking;Sitting    How long can you walk comfortably?  3 min     Diagnostic tests  None    Patient Stated Goals  To be able to walk without an walker.     Currently in Pain?  No/denies       TREATMENT: Therapeutic Exercise Nustep with use of LE and UE use on Nustep -- x 7 min level 4, Ambulation with focus on increasing speed and step length -2 x 264ft with therapist guarding  Side stepping across airex beam - x8 ~24ft Step ups - 2 x 4 steps with UE supports  Heel raises in standing - x 15   Patient demonstrates increased fatigue at the end of the session    PT Education - 11/04/17 1333    Education provided  Yes    Education Details  form/technique with exercise    Person(s) Educated  Patient  Methods  Explanation;Demonstration    Comprehension  Verbalized understanding;Returned demonstration          PT Long Term Goals - 10/14/17 1314      PT LONG TERM GOAL #1   Title  Patient will be independent with HEP to continue benefits of therapy after discharge.    Baseline  Requires moderate cueing with performance of exercise    Time  8    Period  Weeks    Status  On-going      PT LONG TERM GOAL #2   Title  Patient will improve TUG to under 12 seconds to indicate signficant improvement in fall risk.     Baseline  TUG: 24sec; 03/26/2017: TUG: 22sec; 05/12/17: 21.7 sec; 05/14/2017: 19sec; 08/26/2016: 14.75; 10/14/2017: 18sec    Time  8    Period  Weeks    Status  On-going      PT LONG TERM GOAL #3   Title  Patient will improve 66minWT to over 1029ft to indicate functional improvement in walking tolerance and greater ability to walk in stores before requiring a sitting rest break    Baseline  437ft; 03/26/2017: 525ft; 05/12/17:  433ft (primary deficit is DOE); 07/14/17: 450ft DOE; 08/26/2017: 389ft; 10/14/2017: 493ft     Time  8    Period  Weeks    Status  On-going      PT LONG TERM GOAL #4   Title  Patient will demonstrate ability to stand for over 2 min to allow for improved ability to perform household chores at home.     Baseline  able to stand for 50sec before requiring a sitting rest break; 03/26/2017: 60 sec before requiring a break; 05/12/17: 60 seconds per pt subjective report; 07/14/17: 30sec 08/26/2017: 50; 10/14/2017: 10/14/2017: 60sec      Time  8    Period  Weeks    Status  On-going      PT LONG TERM GOAL #5   Title  Pt will decrease 5TSTS to <12 seconds from a chair at 19in in height in order to demonstrate clinically significant improvement in LE strength    Baseline  5xSTS: 14 sec from 22" height chair; 03/26/2017: 12.5sec; 05/12/17: 13.4 sec; 07/14/17: Deffered; 08/26/2017: 21sec from 21" height; 10/14/2017: 19" height 19 sec     Time  8    Period  Weeks    Status  On-going            Plan - 11/04/17 1345    Clinical Impression Statement  Patient arrived 102min late and performed shortened appointment secondary to this fact. Patient demonstrates increased fatigue today but is able to perform 2 rounds of stairs without a rest break indicating functional carryover between sessions.     Rehab Potential  Fair    Clinical Impairments Affecting Rehab Potential  Positive: motivation; Negative: age, chronicity, bilateral knee pain    PT Frequency  2x / week    PT Duration  8 weeks    PT Treatment/Interventions  ADLs/Self Care Home Management;Aquatic Therapy;Canalith Repostioning;Cryotherapy;Electrical Stimulation;Iontophoresis 4mg /ml Dexamethasone;Moist Heat;Traction;Ultrasound;DME Instruction;Gait training;Stair training;Functional mobility training;Therapeutic activities;Therapeutic exercise;Balance training;Neuromuscular re-education;Patient/family education;Wheelchair mobility training;Manual  techniques;Passive range of motion;Vestibular    PT Next Visit Plan  Progress strengthening, reinforce HEP    PT Home Exercise Plan  Cervical retraction    Consulted and Agree with Plan of Care  Patient;Family member/caregiver    Family Member Consulted  Daughter       Patient will benefit from skilled therapeutic intervention  in order to improve the following deficits and impairments:  Abnormal gait, Decreased strength, Obesity, Decreased activity tolerance, Decreased endurance, Difficulty walking, Decreased balance, Decreased mobility, Decreased coordination, Increased muscle spasms, Postural dysfunction, Hypomobility, Decreased range of motion  Visit Diagnosis: Muscle weakness (generalized)  Difficulty in walking, not elsewhere classified  Unsteadiness on feet     Problem List Patient Active Problem List   Diagnosis Date Noted  . Cystocele, midline 10/30/2017  . Rectocele 10/30/2017  . Microcytic red blood cells 09/23/2017  . Localized edema 02/13/2017  . Hemorrhoids 05/17/2016  . Obesity (BMI 30-39.9) 04/26/2016  . Multiple lung nodules 04/19/2016  . Chest pain 04/17/2016  . Partial symptomatic epilepsy with complex partial seizures, not intractable, without status epilepticus ([place]) 06/30/2015  . Bronchiectasis without complication ([place]) 00/17/4944  . Imbalance 11/08/2014  . Essential (primary) hypertension 11/08/2014  . Personal history of malignant neoplasm of breast 11/08/2014  . History of malignant carcinoid tumor of bronchus and lung 11/08/2014  . H/O peptic ulcer 11/08/2014  . Asthma, mild intermittent 11/08/2014  . PE (pulmonary embolism) 11/08/2014  . Gonalgia 11/08/2014  . Leg varices 11/08/2014    Blythe Stanford, PT DPT 11/04/2017, 3:17 PM  [facility] [address], [place], [address] Phone: [phone]   Fax:  [phone]  Name: DALEYSA KRISTIANSEN MRN: [id number] Date of Birth:  1925/04/25

## 2017-11-10 DIAGNOSIS — G40209 Localization-related (focal) (partial) symptomatic epilepsy and epileptic syndromes with complex partial seizures, not intractable, without status epilepticus: Secondary | ICD-10-CM | POA: Diagnosis not present

## 2017-11-10 DIAGNOSIS — Z86718 Personal history of other venous thrombosis and embolism: Secondary | ICD-10-CM | POA: Diagnosis not present

## 2017-11-11 ENCOUNTER — Ambulatory Visit: Payer: Medicare Other

## 2017-11-11 DIAGNOSIS — R262 Difficulty in walking, not elsewhere classified: Secondary | ICD-10-CM

## 2017-11-11 DIAGNOSIS — R2681 Unsteadiness on feet: Secondary | ICD-10-CM | POA: Diagnosis not present

## 2017-11-11 DIAGNOSIS — M6281 Muscle weakness (generalized): Secondary | ICD-10-CM

## 2017-11-11 NOTE — Therapy (Signed)
[facility] [address] [place], [address] Phone: [phone]   Fax:  [phone]  Physical Therapy Treatment  Patient Details  Name: Rebecca Wolf MRN: [id number] Date of Birth: [birth year] Referring Provider: Dr. Sharlet Salina   Encounter Date: 11/11/2017  PT End of Session - 11/11/17 1339    Visit Number  32    Number of Visits  43    Date for PT Re-Evaluation  11/25/17    Authorization Type  Progress note: 2/10    PT Start Time  1315    PT Stop Time  1345    PT Time Calculation (min)  30 min    Equipment Utilized During Treatment  Gait belt    Activity Tolerance  Patient tolerated treatment well    Behavior During Therapy  [facility] for tasks assessed/performed       Past Medical History:  Diagnosis Date  . Asthma   . Breast cancer ([place]) 2003   LT LUMPECTOMY  . Breast cancer ([place]) 2004   LT LUMPECTOMY  . 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  . GERD (gastroesophageal reflux disease)   . Hemorrhoids   . Hypertension   . Personal history of chemotherapy 2004   BREAST CA  . Personal history of radiation therapy 2003   BREAST CA  . Personal history of radiation therapy 2004   BREAST CA  . Polyp of colon 2002   bleeding polyp of right ascending colon  . PUD (peptic ulcer disease)   . Seizures ([place])    epilepsy well controlled    Past Surgical History:  Procedure Laterality Date  . BREAST EXCISIONAL BIOPSY Left 2003   positive  . BREAST EXCISIONAL BIOPSY Left 2004   positive  . BREAST LUMPECTOMY Left 2003   BREAST CA  . BREAST LUMPECTOMY Left 2004   BREAST CA  . HEMICOLECTOMY Right    bowel obstruction  . HEMORRHOID SURGERY    . THORACOTOMY/LOBECTOMY  1999   carcinoid tumor  . TOTAL VAGINAL HYSTERECTOMY    . VARICOSE VEIN SURGERY      There were no vitals filed for this visit.  Subjective Assessment - 11/11/17 1321    Subjective  Patient reports her husband was taken to the  hospital over the night from a fall. Patient states she was up late night tending to her husband.     Pertinent History  Previously seen for balance disorders and for neck pain over the past year. Pt reports R knee feels sore today but is not painful.    Limitations  Walking;Sitting    How long can you walk comfortably?  3 min     Diagnostic tests  None    Patient Stated Goals  To be able to walk without an walker.     Currently in Pain?  No/denies        TREATMENT: Therapeutic Exercise Nustep with use of LE and UE use on Nustep -- x 7 min level 4, Ambulation with focus on increasing speed and step length - 135ft with therapist guarding  Side stepping across airex beam - x8 ~36ft Heel raises in standing - x 15   Patient demonstrates increased fatigue at the end of the session     PT Education - 11/11/17 1324    Education provided  Yes    Education Details  form/technique with exercise    Person(s) Educated  Patient    Methods  Explanation;Demonstration  Comprehension  Verbalized understanding;Returned demonstration          PT Long Term Goals - 10/14/17 1314      PT LONG TERM GOAL #1   Title  Patient will be independent with HEP to continue benefits of therapy after discharge.    Baseline  Requires moderate cueing with performance of exercise    Time  8    Period  Weeks    Status  On-going      PT LONG TERM GOAL #2   Title  Patient will improve TUG to under 12 seconds to indicate signficant improvement in fall risk.     Baseline  TUG: 24sec; 03/26/2017: TUG: 22sec; 05/12/17: 21.7 sec; 05/14/2017: 19sec; 08/26/2016: 14.75; 10/14/2017: 18sec    Time  8    Period  Weeks    Status  On-going      PT LONG TERM GOAL #3   Title  Patient will improve 10minWT to over 1074ft to indicate functional improvement in walking tolerance and greater ability to walk in stores before requiring a sitting rest break    Baseline  430ft; 03/26/2017: 570ft; 05/12/17: 47ft (primary deficit is  DOE); 07/14/17: 429ft DOE; 08/26/2017: 363ft; 10/14/2017: 457ft     Time  8    Period  Weeks    Status  On-going      PT LONG TERM GOAL #4   Title  Patient will demonstrate ability to stand for over 2 min to allow for improved ability to perform household chores at home.     Baseline  able to stand for 50sec before requiring a sitting rest break; 03/26/2017: 60 sec before requiring a break; 05/12/17: 60 seconds per pt subjective report; 07/14/17: 30sec 08/26/2017: 50; 10/14/2017: 10/14/2017: 60sec      Time  8    Period  Weeks    Status  On-going      PT LONG TERM GOAL #5   Title  Pt will decrease 5TSTS to <12 seconds from a chair at 19in in height in order to demonstrate clinically significant improvement in LE strength    Baseline  5xSTS: 14 sec from 22" height chair; 03/26/2017: 12.5sec; 05/12/17: 13.4 sec; 07/14/17: Deffered; 08/26/2017: 21sec from 21" height; 10/14/2017: 19" height 19 sec     Time  8    Period  Weeks    Status  On-going            Plan - 11/11/17 1612    Clinical Impression Statement  Patient demonstrates increased fatigue today and performed less exercises today secondary to patient's fatigue. Patient arrives 10 min late to appointment and performed shortened sesssion. Patient will benefit from further skilled therapy to return to prior level of function.     Rehab Potential  Fair    Clinical Impairments Affecting Rehab Potential  Positive: motivation; Negative: age, chronicity, bilateral knee pain    PT Frequency  2x / week    PT Duration  8 weeks    PT Treatment/Interventions  ADLs/Self Care Home Management;Aquatic Therapy;Canalith Repostioning;Cryotherapy;Electrical Stimulation;Iontophoresis 4mg /ml Dexamethasone;Moist Heat;Traction;Ultrasound;DME Instruction;Gait training;Stair training;Functional mobility training;Therapeutic activities;Therapeutic exercise;Balance training;Neuromuscular re-education;Patient/family education;Wheelchair mobility training;Manual  techniques;Passive range of motion;Vestibular    PT Next Visit Plan  Progress strengthening, reinforce HEP    PT Home Exercise Plan  Cervical retraction    Consulted and Agree with Plan of Care  Patient;Family member/caregiver    Family Member Consulted  Daughter       Patient will benefit from skilled therapeutic intervention in order  to improve the following deficits and impairments:  Abnormal gait, Decreased strength, Obesity, Decreased activity tolerance, Decreased endurance, Difficulty walking, Decreased balance, Decreased mobility, Decreased coordination, Increased muscle spasms, Postural dysfunction, Hypomobility, Decreased range of motion  Visit Diagnosis: Muscle weakness (generalized)  Difficulty in walking, not elsewhere classified     Problem List Patient Active Problem List   Diagnosis Date Noted  . Cystocele, midline 10/30/2017  . Rectocele 10/30/2017  . Microcytic red blood cells 09/23/2017  . Localized edema 02/13/2017  . Hemorrhoids 05/17/2016  . Obesity (BMI 30-39.9) 04/26/2016  . Multiple lung nodules 04/19/2016  . Chest pain 04/17/2016  . Partial symptomatic epilepsy with complex partial seizures, not intractable, without status epilepticus ([place]) 06/30/2015  . Bronchiectasis without complication ([place]) 84/13/2440  . Imbalance 11/08/2014  . Essential (primary) hypertension 11/08/2014  . Personal history of malignant neoplasm of breast 11/08/2014  . History of malignant carcinoid tumor of bronchus and lung 11/08/2014  . H/O peptic ulcer 11/08/2014  . Asthma, mild intermittent 11/08/2014  . PE (pulmonary embolism) 11/08/2014  . Gonalgia 11/08/2014  . Leg varices 11/08/2014    Blythe Stanford, PT DPT 11/11/2017, 4:16 PM  [facility] [address] [place], [address] Phone: [phone]   Fax:  [phone]  Name: Rebecca Wolf MRN: [id number] Date of Birth: January 11, 1925

## 2017-11-14 ENCOUNTER — Telehealth: Payer: Self-pay

## 2017-11-14 NOTE — Telephone Encounter (Signed)
Pt daughter calling to check on status of pessary that was ordered by [facility]. Please advise. Her daughter [name]) can be reached at [phone]

## 2017-11-17 NOTE — Telephone Encounter (Signed)
Patient  Daughter is calling about pessary. Please advise

## 2017-11-18 ENCOUNTER — Ambulatory Visit: Payer: Medicare Other

## 2017-11-18 DIAGNOSIS — R262 Difficulty in walking, not elsewhere classified: Secondary | ICD-10-CM

## 2017-11-18 DIAGNOSIS — M6281 Muscle weakness (generalized): Secondary | ICD-10-CM | POA: Diagnosis not present

## 2017-11-18 DIAGNOSIS — R2681 Unsteadiness on feet: Secondary | ICD-10-CM

## 2017-11-18 NOTE — Therapy (Signed)
[facility] [address] [place], [address] Phone: [phone]   Fax:  [phone]  Physical Therapy Treatment  Patient Details  Name: CAMELLIA POPESCU MRN: [id number] Date of Birth: 06-11-1925 Referring Provider: Dr. Sharlet Salina   Encounter Date: 11/18/2017  PT End of Session - 11/18/17 1330    Visit Number  33    Number of Visits  43    Date for PT Re-Evaluation  11/25/17    Authorization Type  Progress note: 3/10    PT Start Time  1300    PT Stop Time  1345    PT Time Calculation (min)  45 min    Equipment Utilized During Treatment  Gait belt    Activity Tolerance  Patient tolerated treatment well    Behavior During Therapy  [facility] for tasks assessed/performed       Past Medical History:  Diagnosis Date  . Asthma   . Breast cancer ([place]) 2003   LT LUMPECTOMY  . Breast cancer ([place]) 2004   LT LUMPECTOMY  . 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  . GERD (gastroesophageal reflux disease)   . Hemorrhoids   . Hypertension   . Personal history of chemotherapy 2004   BREAST CA  . Personal history of radiation therapy 2003   BREAST CA  . Personal history of radiation therapy 2004   BREAST CA  . Polyp of colon 2002   bleeding polyp of right ascending colon  . PUD (peptic ulcer disease)   . Seizures ([place])    epilepsy well controlled    Past Surgical History:  Procedure Laterality Date  . BREAST EXCISIONAL BIOPSY Left 2003   positive  . BREAST EXCISIONAL BIOPSY Left 2004   positive  . BREAST LUMPECTOMY Left 2003   BREAST CA  . BREAST LUMPECTOMY Left 2004   BREAST CA  . HEMICOLECTOMY Right    bowel obstruction  . HEMORRHOID SURGERY    . THORACOTOMY/LOBECTOMY  1999   carcinoid tumor  . TOTAL VAGINAL HYSTERECTOMY    . VARICOSE VEIN SURGERY      There were no vitals filed for this visit.  Subjective Assessment - 11/18/17 1319    Subjective  Patient reports shes been stressed since her  husband has been in the hospital. Patient states she's been going to she her husband.  Patient reports her knees feel stiff today.     Pertinent History  Previously seen for balance disorders and for neck pain over the past year. Pt reports R knee feels sore today but is not painful.    Limitations  Walking;Sitting    How long can you walk comfortably?  3 min     Diagnostic tests  None    Patient Stated Goals  To be able to walk without an walker.     Currently in Pain?  No/denies        TREATMENT: Therapeutic Exercise Nustep with use of LE and UE use on Nustep -- x 10 min level 4, Ambulation with focus on increasing speed and step length - 2 x122ft with therapist guarding  Hip abduction in standing 2 x 10  Squats in standing with UE support - x 20 Side Step ups onto airex pad - x8 ~85ft Heel raises in standing - x 15   Patient demonstrates increased fatigue at the end of the session  PT Education - 11/18/17 1326    Education provided  Yes  Education Details  form/technique with exercise    Person(s) Educated  Patient    Methods  Explanation;Demonstration    Comprehension  Verbalized understanding;Returned demonstration          PT Long Term Goals - 10/14/17 1314      PT LONG TERM GOAL #1   Title  Patient will be independent with HEP to continue benefits of therapy after discharge.    Baseline  Requires moderate cueing with performance of exercise    Time  8    Period  Weeks    Status  On-going      PT LONG TERM GOAL #2   Title  Patient will improve TUG to under 12 seconds to indicate signficant improvement in fall risk.     Baseline  TUG: 24sec; 03/26/2017: TUG: 22sec; 05/12/17: 21.7 sec; 05/14/2017: 19sec; 08/26/2016: 14.75; 10/14/2017: 18sec    Time  8    Period  Weeks    Status  On-going      PT LONG TERM GOAL #3   Title  Patient will improve 77minWT to over 1011ft to indicate functional improvement in walking tolerance and greater ability to walk in stores before  requiring a sitting rest break    Baseline  4104ft; 03/26/2017: 527ft; 05/12/17: 485ft (primary deficit is DOE); 07/14/17: 447ft DOE; 08/26/2017: 323ft; 10/14/2017: 448ft     Time  8    Period  Weeks    Status  On-going      PT LONG TERM GOAL #4   Title  Patient will demonstrate ability to stand for over 2 min to allow for improved ability to perform household chores at home.     Baseline  able to stand for 50sec before requiring a sitting rest break; 03/26/2017: 60 sec before requiring a break; 05/12/17: 60 seconds per pt subjective report; 07/14/17: 30sec 08/26/2017: 50; 10/14/2017: 10/14/2017: 60sec      Time  8    Period  Weeks    Status  On-going      PT LONG TERM GOAL #5   Title  Pt will decrease 5TSTS to <12 seconds from a chair at 19in in height in order to demonstrate clinically significant improvement in LE strength    Baseline  5xSTS: 14 sec from 22" height chair; 03/26/2017: 12.5sec; 05/12/17: 13.4 sec; 07/14/17: Deffered; 08/26/2017: 21sec from 21" height; 10/14/2017: 19" height 19 sec     Time  8    Period  Weeks    Status  On-going            Plan - 11/18/17 1337    Clinical Impression Statement  Patient demonstrates ability to perform greater amount of exercises today versus previous sessions indicating functional carryover between sessions. Patient continues to demonstrate increased fatigue between exercises and requires greater amount of resting breaks. Patient will benefit fromfurther skilled therapy focused on improving limitations to return to prior level of function.     Rehab Potential  Fair    Clinical Impairments Affecting Rehab Potential  Positive: motivation; Negative: age, chronicity, bilateral knee pain    PT Frequency  2x / week    PT Duration  8 weeks    PT Treatment/Interventions  ADLs/Self Care Home Management;Aquatic Therapy;Canalith Repostioning;Cryotherapy;Electrical Stimulation;Iontophoresis 4mg /ml Dexamethasone;Moist Heat;Traction;Ultrasound;DME  Instruction;Gait training;Stair training;Functional mobility training;Therapeutic activities;Therapeutic exercise;Balance training;Neuromuscular re-education;Patient/family education;Wheelchair mobility training;Manual techniques;Passive range of motion;Vestibular    PT Next Visit Plan  Progress strengthening, reinforce HEP    PT Home Exercise Plan  Cervical retraction  Consulted and Agree with Plan of Care  Patient;Family member/caregiver    Family Member Consulted  Daughter       Patient will benefit from skilled therapeutic intervention in order to improve the following deficits and impairments:  Abnormal gait, Decreased strength, Obesity, Decreased activity tolerance, Decreased endurance, Difficulty walking, Decreased balance, Decreased mobility, Decreased coordination, Increased muscle spasms, Postural dysfunction, Hypomobility, Decreased range of motion  Visit Diagnosis: Muscle weakness (generalized)  Difficulty in walking, not elsewhere classified  Unsteadiness on feet     Problem List Patient Active Problem List   Diagnosis Date Noted  . Cystocele, midline 10/30/2017  . Rectocele 10/30/2017  . Microcytic red blood cells 09/23/2017  . Localized edema 02/13/2017  . Hemorrhoids 05/17/2016  . Obesity (BMI 30-39.9) 04/26/2016  . Multiple lung nodules 04/19/2016  . Chest pain 04/17/2016  . Partial symptomatic epilepsy with complex partial seizures, not intractable, without status epilepticus ([place]) 06/30/2015  . Bronchiectasis without complication ([place]) 36/46/8032  . Imbalance 11/08/2014  . Essential (primary) hypertension 11/08/2014  . Personal history of malignant neoplasm of breast 11/08/2014  . History of malignant carcinoid tumor of bronchus and lung 11/08/2014  . H/O peptic ulcer 11/08/2014  . Asthma, mild intermittent 11/08/2014  . PE (pulmonary embolism) 11/08/2014  . Gonalgia 11/08/2014  . Leg varices 11/08/2014    Blythe Stanford, PT DPT 11/18/2017, 1:51  PM  [facility] [address], [place], [address] Phone: [phone]   Fax:  [phone]  Name: GIANNIE SOLIDAY MRN: [id number] Date of Birth: 1924/08/01

## 2017-11-25 ENCOUNTER — Ambulatory Visit: Payer: Medicare Other

## 2017-11-25 DIAGNOSIS — R2681 Unsteadiness on feet: Secondary | ICD-10-CM

## 2017-11-25 DIAGNOSIS — R262 Difficulty in walking, not elsewhere classified: Secondary | ICD-10-CM | POA: Diagnosis not present

## 2017-11-25 DIAGNOSIS — M6281 Muscle weakness (generalized): Secondary | ICD-10-CM

## 2017-11-25 NOTE — Therapy (Signed)
[facility] [address] [place], [address] Phone: [phone]   Fax:  [phone]  Physical Therapy Treatment  Patient Details  Name: Rebecca Wolf MRN: [id number] Date of Birth: 1924-08-15 Referring Provider: Dr. Sharlet Salina   Encounter Date: 11/25/2017  PT End of Session - 11/25/17 1346    Visit Number  34    Number of Visits  43    Date for PT Re-Evaluation  11/25/17    Authorization Type  Progress note: 4/10    PT Start Time  1300    PT Stop Time  1341    PT Time Calculation (min)  41 min    Equipment Utilized During Treatment  Gait belt    Activity Tolerance  Patient tolerated treatment well    Behavior During Therapy  [facility] for tasks assessed/performed       Past Medical History:  Diagnosis Date  . Asthma   . Breast cancer ([place]) 2003   LT LUMPECTOMY  . Breast cancer ([place]) 2004   LT LUMPECTOMY  . 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  . GERD (gastroesophageal reflux disease)   . Hemorrhoids   . Hypertension   . Personal history of chemotherapy 2004   BREAST CA  . Personal history of radiation therapy 2003   BREAST CA  . Personal history of radiation therapy 2004   BREAST CA  . Polyp of colon 2002   bleeding polyp of right ascending colon  . PUD (peptic ulcer disease)   . Seizures ([place])    epilepsy well controlled    Past Surgical History:  Procedure Laterality Date  . BREAST EXCISIONAL BIOPSY Left 2003   positive  . BREAST EXCISIONAL BIOPSY Left 2004   positive  . BREAST LUMPECTOMY Left 2003   BREAST CA  . BREAST LUMPECTOMY Left 2004   BREAST CA  . HEMICOLECTOMY Right    bowel obstruction  . HEMORRHOID SURGERY    . THORACOTOMY/LOBECTOMY  1999   carcinoid tumor  . TOTAL VAGINAL HYSTERECTOMY    . VARICOSE VEIN SURGERY      There were no vitals filed for this visit.  Subjective Assessment - 11/25/17 1331    Subjective  Patient reports her L knee has been having  some increased pain and reports it hurt her to get in and out of the car.     Pertinent History  Previously seen for balance disorders and for neck pain over the past year. Pt reports R knee feels sore today but is not painful.    Limitations  Walking;Sitting    How long can you walk comfortably?  3 min     Diagnostic tests  None    Patient Stated Goals  To be able to walk without an walker.     Currently in Pain?  Yes    Pain Score  3     Pain Location  Knee    Pain Orientation  Left    Pain Descriptors / Indicators  Aching    Pain Type  Chronic pain    Pain Onset  1 to 4 weeks ago    Pain Frequency  Constant         TREATMENT: Therapeutic Exercise Nustep with use of LE and UE use on Nustep -- x 10 min level 4, Hip abduction in sitting -- 2 x 10  Squats in standing with UE support - 2 x 12 Knee distraction in sitting -  x 5 sec x 10 Knee flexion/extension with weighted ball - x 15 AAROM knee flexion/extension in sitting - x 10  Quad sets in sitting -- x 10    Patient demonstrates increased fatigue at the end of the session     PT Education - 11/25/17 1345    Education provided  Yes    Education Details  form/technique with exercise    Person(s) Educated  Patient    Methods  Explanation;Demonstration    Comprehension  Verbalized understanding;Returned demonstration          PT Long Term Goals - 10/14/17 1314      PT LONG TERM GOAL #1   Title  Patient will be independent with HEP to continue benefits of therapy after discharge.    Baseline  Requires moderate cueing with performance of exercise    Time  8    Period  Weeks    Status  On-going      PT LONG TERM GOAL #2   Title  Patient will improve TUG to under 12 seconds to indicate signficant improvement in fall risk.     Baseline  TUG: 24sec; 03/26/2017: TUG: 22sec; 05/12/17: 21.7 sec; 05/14/2017: 19sec; 08/26/2016: 14.75; 10/14/2017: 18sec    Time  8    Period  Weeks    Status  On-going      PT LONG TERM GOAL  #3   Title  Patient will improve 25minWT to over 1046ft to indicate functional improvement in walking tolerance and greater ability to walk in stores before requiring a sitting rest break    Baseline  426ft; 03/26/2017: 554ft; 05/12/17: 457ft (primary deficit is DOE); 07/14/17: 497ft DOE; 08/26/2017: 363ft; 10/14/2017: 416ft     Time  8    Period  Weeks    Status  On-going      PT LONG TERM GOAL #4   Title  Patient will demonstrate ability to stand for over 2 min to allow for improved ability to perform household chores at home.     Baseline  able to stand for 50sec before requiring a sitting rest break; 03/26/2017: 60 sec before requiring a break; 05/12/17: 60 seconds per pt subjective report; 07/14/17: 30sec 08/26/2017: 50; 10/14/2017: 10/14/2017: 60sec      Time  8    Period  Weeks    Status  On-going      PT LONG TERM GOAL #5   Title  Pt will decrease 5TSTS to <12 seconds from a chair at 19in in height in order to demonstrate clinically significant improvement in LE strength    Baseline  5xSTS: 14 sec from 22" height chair; 03/26/2017: 12.5sec; 05/12/17: 13.4 sec; 07/14/17: Deffered; 08/26/2017: 21sec from 21" height; 10/14/2017: 19" height 19 sec     Time  8    Period  Weeks    Status  On-going            Plan - 11/25/17 1347    Clinical Impression Statement  Patient demonstrates increased L knee pain with is worse with knee flexion from full extension to  90 degrees of flexion. Patient reports decreased pain after performing exercises and pain tends to decrease after performing ~ 5 reps of a movement. Educated patient on importance of mobility and hip function with exercise. Patient will benefit from further skilled therapy to return to prior level of function.      Rehab Potential  Fair    Clinical Impairments Affecting Rehab Potential  Positive: motivation; Negative: age, chronicity,  bilateral knee pain    PT Frequency  2x / week    PT Duration  8 weeks    PT Treatment/Interventions   ADLs/Self Care Home Management;Aquatic Therapy;Canalith Repostioning;Cryotherapy;Electrical Stimulation;Iontophoresis 4mg /ml Dexamethasone;Moist Heat;Traction;Ultrasound;DME Instruction;Gait training;Stair training;Functional mobility training;Therapeutic activities;Therapeutic exercise;Balance training;Neuromuscular re-education;Patient/family education;Wheelchair mobility training;Manual techniques;Passive range of motion;Vestibular    PT Next Visit Plan  Progress strengthening, reinforce HEP    PT Home Exercise Plan  Cervical retraction    Consulted and Agree with Plan of Care  Patient;Family member/caregiver    Family Member Consulted  Daughter       Patient will benefit from skilled therapeutic intervention in order to improve the following deficits and impairments:  Abnormal gait, Decreased strength, Obesity, Decreased activity tolerance, Decreased endurance, Difficulty walking, Decreased balance, Decreased mobility, Decreased coordination, Increased muscle spasms, Postural dysfunction, Hypomobility, Decreased range of motion  Visit Diagnosis: Muscle weakness (generalized)  Difficulty in walking, not elsewhere classified  Unsteadiness on feet     Problem List Patient Active Problem List   Diagnosis Date Noted  . Cystocele, midline 10/30/2017  . Rectocele 10/30/2017  . Microcytic red blood cells 09/23/2017  . Localized edema 02/13/2017  . Hemorrhoids 05/17/2016  . Obesity (BMI 30-39.9) 04/26/2016  . Multiple lung nodules 04/19/2016  . Chest pain 04/17/2016  . Partial symptomatic epilepsy with complex partial seizures, not intractable, without status epilepticus ([place]) 06/30/2015  . Bronchiectasis without complication ([place]) 37/90/2409  . Imbalance 11/08/2014  . Essential (primary) hypertension 11/08/2014  . Personal history of malignant neoplasm of breast 11/08/2014  . History of malignant carcinoid tumor of bronchus and lung 11/08/2014  . H/O peptic ulcer 11/08/2014  .  Asthma, mild intermittent 11/08/2014  . PE (pulmonary embolism) 11/08/2014  . Gonalgia 11/08/2014  . Leg varices 11/08/2014    Blythe Stanford, PT DPT 11/25/2017, 1:51 PM  [facility] [address] [place], [address] Phone: [phone]   Fax:  [phone]  Name: Rebecca Wolf MRN: [id number] Date of Birth: 1925/05/25

## 2017-11-26 ENCOUNTER — Other Ambulatory Visit: Payer: Self-pay | Admitting: Internal Medicine

## 2017-11-27 ENCOUNTER — Other Ambulatory Visit: Payer: Self-pay | Admitting: Internal Medicine

## 2017-12-01 DIAGNOSIS — R208 Other disturbances of skin sensation: Secondary | ICD-10-CM | POA: Diagnosis not present

## 2017-12-01 DIAGNOSIS — L821 Other seborrheic keratosis: Secondary | ICD-10-CM | POA: Diagnosis not present

## 2017-12-01 DIAGNOSIS — L820 Inflamed seborrheic keratosis: Secondary | ICD-10-CM | POA: Diagnosis not present

## 2017-12-02 ENCOUNTER — Ambulatory Visit: Payer: Medicare Other | Attending: Physical Medicine and Rehabilitation

## 2017-12-02 DIAGNOSIS — R262 Difficulty in walking, not elsewhere classified: Secondary | ICD-10-CM | POA: Insufficient documentation

## 2017-12-02 DIAGNOSIS — M6281 Muscle weakness (generalized): Secondary | ICD-10-CM

## 2017-12-02 DIAGNOSIS — R2681 Unsteadiness on feet: Secondary | ICD-10-CM | POA: Insufficient documentation

## 2017-12-02 NOTE — Therapy (Signed)
[facility] [address], [place], [address] Phone: [phone]   Fax:  [phone]  Physical Therapy Treatment  Patient Details  Name: Rebecca Wolf MRN: [id number] Date of Birth: [birth year] Referring Provider: Dr. Sharlet Salina   Encounter Date: 12/02/2017  PT End of Session - 12/02/17 1331    Visit Number  35    Number of Visits  43    Date for PT Re-Evaluation  11/25/17    Authorization Type  Progress note: 5/10    PT Start Time  1300    PT Stop Time  1345    PT Time Calculation (min)  45 min    Equipment Utilized During Treatment  Gait belt    Activity Tolerance  Patient tolerated treatment well    Behavior During Therapy  [facility] for tasks assessed/performed       Past Medical History:  Diagnosis Date  . Asthma   . Breast cancer ([place]) 2003   LT LUMPECTOMY  . Breast cancer ([place]) 2004   LT LUMPECTOMY  . 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  . GERD (gastroesophageal reflux disease)   . Hemorrhoids   . Hypertension   . Personal history of chemotherapy 2004   BREAST CA  . Personal history of radiation therapy 2003   BREAST CA  . Personal history of radiation therapy 2004   BREAST CA  . Polyp of colon 2002   bleeding polyp of right ascending colon  . PUD (peptic ulcer disease)   . Seizures ([place])    epilepsy well controlled    Past Surgical History:  Procedure Laterality Date  . BREAST EXCISIONAL BIOPSY Left 2003   positive  . BREAST EXCISIONAL BIOPSY Left 2004   positive  . BREAST LUMPECTOMY Left 2003   BREAST CA  . BREAST LUMPECTOMY Left 2004   BREAST CA  . HEMICOLECTOMY Right    bowel obstruction  . HEMORRHOID SURGERY    . THORACOTOMY/LOBECTOMY  1999   carcinoid tumor  . TOTAL VAGINAL HYSTERECTOMY    . VARICOSE VEIN SURGERY      There were no vitals filed for this visit.  Subjective Assessment - 12/02/17 1326    Subjective  Patient reports no major changes since the  previous visitation session. Patient reports she has been to statrt hving some neck pain and is having increased stiffness and pain along both of her knees.     Pertinent History  Previously seen for balance disorders and for neck pain over the past year. Pt reports R knee feels sore today but is not painful.    Limitations  Walking;Sitting    How long can you walk comfortably?  3 min     Diagnostic tests  None    Patient Stated Goals  To be able to walk without an walker.     Currently in Pain?  Yes    Pain Score  4     Pain Location  Knee    Pain Orientation  Left    Pain Descriptors / Indicators  Aching    Pain Type  Chronic pain    Pain Onset  1 to 4 weeks ago    Pain Frequency  Constant         TREATMENT   Therapeutic Exercise Sit to stands with UE support with therapist - 2 x 5 Standing without UE support --  3 x 30sec Ambulation around the gym - 2 x 123ft  Standing heel raised with UE support - 2 x 10  Nutstep in sitting with focus on improving speed - x 69min  Patient demonstrates increased fatigue at the end of the session     PT Education - 12/02/17 1330    Education provided  Yes    Education Details  form/technique with exercise     Person(s) Educated  Patient    Methods  Explanation;Demonstration    Comprehension  Verbalized understanding;Returned demonstration          PT Long Term Goals - 12/02/17 1754      PT LONG TERM GOAL #1   Title  Patient will be independent with HEP to continue benefits of therapy after discharge.    Baseline  Requires moderate cueing with performance of exercise; 12/02/2017: moderate cueing to complete HEP    Time  8    Period  Weeks    Status  On-going      PT LONG TERM GOAL #2   Title  Patient will improve TUG to under 12 seconds to indicate signficant improvement in fall risk.     Baseline  TUG: 24sec; 03/26/2017: TUG: 22sec; 05/12/17: 21.7 sec; 05/14/2017: 19sec; 08/26/2016: 14.75; 10/14/2017: 18sec; 12/02/2017: 16sec    Time   8    Period  Weeks    Status  On-going      PT LONG TERM GOAL #3   Title  Patient will improve 73minWT to over 1041ft to indicate functional improvement in walking tolerance and greater ability to walk in stores before requiring a sitting rest break    Baseline  455ft; 03/26/2017: 546ft; 05/12/17: 418ft (primary deficit is DOE); 07/14/17: 414ft DOE; 08/26/2017: 326ft; 10/14/2017: 47ft ; 12/02/2017: 280ft    Time  8    Period  Weeks    Status  On-going      PT LONG TERM GOAL #4   Title  Patient will demonstrate ability to stand for over 2 min to allow for improved ability to perform household chores at home.     Baseline  able to stand for 50sec before requiring a sitting rest break; 03/26/2017: 60 sec before requiring a break; 05/12/17: 60 seconds per pt subjective report; 07/14/17: 30sec 08/26/2017: 50; 10/14/2017: 10/14/2017: 60sec  12/02/2017: 45sec    Time  8    Period  Weeks    Status  On-going      PT LONG TERM GOAL #5   Title  Pt will decrease 5TSTS to <12 seconds from a chair at 19in in height in order to demonstrate clinically significant improvement in LE strength    Baseline  5xSTS: 14 sec from 22" height chair; 03/26/2017: 12.5sec; 05/12/17: 13.4 sec; 07/14/17: Deffered; 08/26/2017: 21sec from 21" height; 10/14/2017: 19" height 19 sec; 12/02/2017: 15 at a height of 23in    Time  8    Period  Weeks    Status  On-going            Plan - 12/02/17 1756    Clinical Impression Statement  Patient's primary deficit continues to be increased DOE, with performing standing and ambulation exercises. Patient demonstrates decreased walking endurance with exercise and testing and fatigues quickly with all exercises indicating further need of therapy. Patient is unable to walk without supervision requiring need for physical therapy. Patient will benefit from further skilled therapy focused on improving limitations to return to prior level of function.     Rehab Potential  Fair    Clinical Impairments  Affecting Rehab  Potential  Positive: motivation; Negative: age, chronicity, bilateral knee pain    PT Frequency  2x / week    PT Duration  8 weeks    PT Treatment/Interventions  ADLs/Self Care Home Management;Aquatic Therapy;Canalith Repostioning;Cryotherapy;Electrical Stimulation;Iontophoresis 4mg /ml Dexamethasone;Moist Heat;Traction;Ultrasound;DME Instruction;Gait training;Stair training;Functional mobility training;Therapeutic activities;Therapeutic exercise;Balance training;Neuromuscular re-education;Patient/family education;Wheelchair mobility training;Manual techniques;Passive range of motion;Vestibular    PT Next Visit Plan  Progress strengthening, reinforce HEP    PT Home Exercise Plan  Cervical retraction    Consulted and Agree with Plan of Care  Patient;Family member/caregiver    Family Member Consulted  Daughter       Patient will benefit from skilled therapeutic intervention in order to improve the following deficits and impairments:  Abnormal gait, Decreased strength, Obesity, Decreased activity tolerance, Decreased endurance, Difficulty walking, Decreased balance, Decreased mobility, Decreased coordination, Increased muscle spasms, Postural dysfunction, Hypomobility, Decreased range of motion  Visit Diagnosis: Muscle weakness (generalized)  Difficulty in walking, not elsewhere classified  Unsteadiness on feet     Problem List Patient Active Problem List   Diagnosis Date Noted  . Cystocele, midline 10/30/2017  . Rectocele 10/30/2017  . Microcytic red blood cells 09/23/2017  . Localized edema 02/13/2017  . Hemorrhoids 05/17/2016  . Obesity (BMI 30-39.9) 04/26/2016  . Multiple lung nodules 04/19/2016  . Chest pain 04/17/2016  . Partial symptomatic epilepsy with complex partial seizures, not intractable, without status epilepticus ([place]) 06/30/2015  . Bronchiectasis without complication ([place]) 33/00/7622  . Imbalance 11/08/2014  . Essential (primary) hypertension  11/08/2014  . Personal history of malignant neoplasm of breast 11/08/2014  . History of malignant carcinoid tumor of bronchus and lung 11/08/2014  . H/O peptic ulcer 11/08/2014  . Asthma, mild intermittent 11/08/2014  . PE (pulmonary embolism) 11/08/2014  . Gonalgia 11/08/2014  . Leg varices 11/08/2014    Blythe Stanford, PT DPT 12/02/2017, 6:04 PM  [facility] [address] [place], [address] Phone: [phone]   Fax:  [phone]  Name: Rebecca Wolf MRN: [id number] Date of Birth: [birth year]

## 2017-12-09 ENCOUNTER — Ambulatory Visit: Payer: Medicare Other

## 2017-12-09 DIAGNOSIS — R262 Difficulty in walking, not elsewhere classified: Secondary | ICD-10-CM

## 2017-12-09 DIAGNOSIS — M6281 Muscle weakness (generalized): Secondary | ICD-10-CM | POA: Diagnosis not present

## 2017-12-09 DIAGNOSIS — R2681 Unsteadiness on feet: Secondary | ICD-10-CM | POA: Diagnosis not present

## 2017-12-09 NOTE — Therapy (Signed)
[facility] [address] [place], [address] Phone: [phone]   Fax:  [phone]  Physical Therapy Treatment  Patient Details  Name: Rebecca Wolf MRN: [id number] Date of Birth: 03/14/1925 Referring Provider: Dr. Sharlet Salina   Encounter Date: 12/09/2017  PT End of Session - 12/09/17 1413    Visit Number  36    Number of Visits  43    Date for PT Re-Evaluation  01/14/18    Authorization Type  Progress note: 1/10    PT Start Time  1345    PT Stop Time  1430    PT Time Calculation (min)  45 min    Equipment Utilized During Treatment  Gait belt    Activity Tolerance  Patient tolerated treatment well    Behavior During Therapy  [facility] for tasks assessed/performed       Past Medical History:  Diagnosis Date  . Asthma   . Breast cancer ([place]) 2003   LT LUMPECTOMY  . Breast cancer ([place]) 2004   LT LUMPECTOMY  . 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  . GERD (gastroesophageal reflux disease)   . Hemorrhoids   . Hypertension   . Personal history of chemotherapy 2004   BREAST CA  . Personal history of radiation therapy 2003   BREAST CA  . Personal history of radiation therapy 2004   BREAST CA  . Polyp of colon 2002   bleeding polyp of right ascending colon  . PUD (peptic ulcer disease)   . Seizures ([place])    epilepsy well controlled    Past Surgical History:  Procedure Laterality Date  . BREAST EXCISIONAL BIOPSY Left 2003   positive  . BREAST EXCISIONAL BIOPSY Left 2004   positive  . BREAST LUMPECTOMY Left 2003   BREAST CA  . BREAST LUMPECTOMY Left 2004   BREAST CA  . HEMICOLECTOMY Right    bowel obstruction  . HEMORRHOID SURGERY    . THORACOTOMY/LOBECTOMY  1999   carcinoid tumor  . TOTAL VAGINAL HYSTERECTOMY    . VARICOSE VEIN SURGERY      There were no vitals filed for this visit.  Subjective Assessment - 12/09/17 1354    Subjective  Patient reports she has been watching less TV  throughout the day. Patient states the neck pain has been improving and states her knbees continues to feel stiff.     Pertinent History  Previously seen for balance disorders and for neck pain over the past year. Pt reports R knee feels sore today but is not painful.    Limitations  Walking;Sitting    How long can you walk comfortably?  3 min     Diagnostic tests  None    Patient Stated Goals  To be able to walk without an walker.     Currently in Pain?  No/denies    Pain Onset  1 to 4 weeks ago       TREATMENT    Therapeutic Exercise Squats with UE support - 2 x 10 Ambulation around the gym - x212ft, x 169ft  Hip abduction in standing - 2 x 10  Standing heel raises with UE support - x20  Nutstep in sitting with focus on improving speed - x 26min; at level 4 to improve strength    Patient demonstrates increased fatigue at the end of the session  PT Education - 12/09/17 1406    Education provided  Yes    Education  Details  form/technique with exercise    Person(s) Educated  Patient    Methods  Explanation;Demonstration    Comprehension  Verbalized understanding;Returned demonstration          PT Long Term Goals - 12/02/17 1754      PT LONG TERM GOAL #1   Title  Patient will be independent with HEP to continue benefits of therapy after discharge.    Baseline  Requires moderate cueing with performance of exercise; 12/02/2017: moderate cueing to complete HEP    Time  8    Period  Weeks    Status  On-going      PT LONG TERM GOAL #2   Title  Patient will improve TUG to under 12 seconds to indicate signficant improvement in fall risk.     Baseline  TUG: 24sec; 03/26/2017: TUG: 22sec; 05/12/17: 21.7 sec; 05/14/2017: 19sec; 08/26/2016: 14.75; 10/14/2017: 18sec; 12/02/2017: 16sec    Time  8    Period  Weeks    Status  On-going      PT LONG TERM GOAL #3   Title  Patient will improve 32minWT to over 1051ft to indicate functional improvement in walking tolerance and greater ability to  walk in stores before requiring a sitting rest break    Baseline  455ft; 03/26/2017: 555ft; 05/12/17: 432ft (primary deficit is DOE); 07/14/17: 49ft DOE; 08/26/2017: 349ft; 10/14/2017: 449ft ; 12/02/2017: 295ft    Time  8    Period  Weeks    Status  On-going      PT LONG TERM GOAL #4   Title  Patient will demonstrate ability to stand for over 2 min to allow for improved ability to perform household chores at home.     Baseline  able to stand for 50sec before requiring a sitting rest break; 03/26/2017: 60 sec before requiring a break; 05/12/17: 60 seconds per pt subjective report; 07/14/17: 30sec 08/26/2017: 50; 10/14/2017: 10/14/2017: 60sec  12/02/2017: 45sec    Time  8    Period  Weeks    Status  On-going      PT LONG TERM GOAL #5   Title  Pt will decrease 5TSTS to <12 seconds from a chair at 19in in height in order to demonstrate clinically significant improvement in LE strength    Baseline  5xSTS: 14 sec from 22" height chair; 03/26/2017: 12.5sec; 05/12/17: 13.4 sec; 07/14/17: Deffered; 08/26/2017: 21sec from 21" height; 10/14/2017: 19" height 19 sec; 12/02/2017: 15 at a height of 23in    Time  8    Period  Weeks    Status  On-going            Plan - 12/09/17 1428    Clinical Impression Statement  Patient demonstrates improvement in walking ability compared to the previous session. Patient also demonstrates ability to perform greater amount of standing exercises. Patient continues to have increased fatigue with DOE; patient will benefit from further skilled therapy to return to prior level of function.     Rehab Potential  Fair    Clinical Impairments Affecting Rehab Potential  Positive: motivation; Negative: age, chronicity, bilateral knee pain    PT Frequency  2x / week    PT Duration  8 weeks    PT Treatment/Interventions  ADLs/Self Care Home Management;Aquatic Therapy;Canalith Repostioning;Cryotherapy;Electrical Stimulation;Iontophoresis 4mg /ml Dexamethasone;Moist Heat;Traction;Ultrasound;DME  Instruction;Gait training;Stair training;Functional mobility training;Therapeutic activities;Therapeutic exercise;Balance training;Neuromuscular re-education;Patient/family education;Wheelchair mobility training;Manual techniques;Passive range of motion;Vestibular    PT Next Visit Plan  Progress strengthening, reinforce HEP  PT Home Exercise Plan  Cervical retraction    Consulted and Agree with Plan of Care  Patient;Family member/caregiver    Family Member Consulted  Daughter       Patient will benefit from skilled therapeutic intervention in order to improve the following deficits and impairments:  Abnormal gait, Decreased strength, Obesity, Decreased activity tolerance, Decreased endurance, Difficulty walking, Decreased balance, Decreased mobility, Decreased coordination, Increased muscle spasms, Postural dysfunction, Hypomobility, Decreased range of motion  Visit Diagnosis: Muscle weakness (generalized)  Unsteadiness on feet  Difficulty in walking, not elsewhere classified     Problem List Patient Active Problem List   Diagnosis Date Noted  . Cystocele, midline 10/30/2017  . Rectocele 10/30/2017  . Microcytic red blood cells 09/23/2017  . Localized edema 02/13/2017  . Hemorrhoids 05/17/2016  . Obesity (BMI 30-39.9) 04/26/2016  . Multiple lung nodules 04/19/2016  . Chest pain 04/17/2016  . Partial symptomatic epilepsy with complex partial seizures, not intractable, without status epilepticus ([place]) 06/30/2015  . Bronchiectasis without complication ([place]) 16/96/7893  . Imbalance 11/08/2014  . Essential (primary) hypertension 11/08/2014  . Personal history of malignant neoplasm of breast 11/08/2014  . History of malignant carcinoid tumor of bronchus and lung 11/08/2014  . H/O peptic ulcer 11/08/2014  . Asthma, mild intermittent 11/08/2014  . PE (pulmonary embolism) 11/08/2014  . Gonalgia 11/08/2014  . Leg varices 11/08/2014    Blythe Stanford, PT DPT 12/09/2017, 2:48  PM  [facility] [address], [place], [address] Phone: [phone]   Fax:  [phone]  Name: Rebecca Wolf MRN: [id number] Date of Birth: [birth year]

## 2017-12-10 ENCOUNTER — Telehealth: Payer: Self-pay

## 2017-12-10 NOTE — Telephone Encounter (Signed)
Pt's daughter [name] calling to check on status of pessary. States the front desk told her it could possibly be another 2 weeks. Daughter wants to know if pt should come in to be fitted for a different type of pessary or something else. Cb# [phone]  A pessary arrived with supplies and I placed in your chair today, not sure if it was for this pt. Please advise pt's daughter. Thank you.

## 2017-12-16 ENCOUNTER — Ambulatory Visit: Payer: Medicare Other

## 2017-12-16 DIAGNOSIS — R262 Difficulty in walking, not elsewhere classified: Secondary | ICD-10-CM | POA: Diagnosis not present

## 2017-12-16 DIAGNOSIS — M6281 Muscle weakness (generalized): Secondary | ICD-10-CM | POA: Diagnosis not present

## 2017-12-16 DIAGNOSIS — R2681 Unsteadiness on feet: Secondary | ICD-10-CM

## 2017-12-16 NOTE — Therapy (Signed)
[facility] [address] [place], [address] Phone: [phone]   Fax:  [phone]  Physical Therapy Treatment  Patient Details  Name: Rebecca Wolf MRN: [id number] Date of Birth: 1925/06/29 Referring Provider: Dr. Sharlet Salina   Encounter Date: 12/16/2017  PT End of Session - 12/16/17 1337    Visit Number  37    Number of Visits  43    Date for PT Re-Evaluation  01/14/18    Authorization Type  Progress note: 2/10    PT Start Time  1300    PT Stop Time  1345    PT Time Calculation (min)  45 min    Equipment Utilized During Treatment  Gait belt    Activity Tolerance  Patient tolerated treatment well    Behavior During Therapy  [facility] for tasks assessed/performed       Past Medical History:  Diagnosis Date  . Asthma   . Breast cancer ([place]) 2003   LT LUMPECTOMY  . Breast cancer ([place]) 2004   LT LUMPECTOMY  . 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  . GERD (gastroesophageal reflux disease)   . Hemorrhoids   . Hypertension   . Personal history of chemotherapy 2004   BREAST CA  . Personal history of radiation therapy 2003   BREAST CA  . Personal history of radiation therapy 2004   BREAST CA  . Polyp of colon 2002   bleeding polyp of right ascending colon  . PUD (peptic ulcer disease)   . Seizures ([place])    epilepsy well controlled    Past Surgical History:  Procedure Laterality Date  . BREAST EXCISIONAL BIOPSY Left 2003   positive  . BREAST EXCISIONAL BIOPSY Left 2004   positive  . BREAST LUMPECTOMY Left 2003   BREAST CA  . BREAST LUMPECTOMY Left 2004   BREAST CA  . HEMICOLECTOMY Right    bowel obstruction  . HEMORRHOID SURGERY    . THORACOTOMY/LOBECTOMY  1999   carcinoid tumor  . TOTAL VAGINAL HYSTERECTOMY    . VARICOSE VEIN SURGERY      There were no vitals filed for this visit.  Subjective Assessment - 12/16/17 1322    Subjective  Patient reports no major changes since the  previous sessions. Patient states her legs are feeling stiff today.     Pertinent History  Previously seen for balance disorders and for neck pain over the past year. Pt reports R knee feels sore today but is not painful.    Limitations  Walking;Sitting    How long can you walk comfortably?  3 min     Diagnostic tests  None    Patient Stated Goals  To be able to walk without an walker.     Currently in Pain?  No/denies    Pain Onset  1 to 4 weeks ago       TREATMENT    Therapeutic Exercise Nutstep in sitting with focus on improving speed - x 29min; at level 4 to improve strength  Ambulation around the gym - x262ft, x 156ft  Side stepping along with B UE support with therapist - x 16ft Forward/backward walking - x62ft B Sit to stands - 2 x 5 with therapist support  Heel raises in standing - x 10    Patient demonstrates increased fatigue at the end of the session   PT Education - 12/16/17 1337    Education provided  Yes  Education Details  form/technique with exercise    Person(s) Educated  Patient    Methods  Explanation    Comprehension  Verbalized understanding;Returned demonstration          PT Long Term Goals - 12/02/17 1754      PT LONG TERM GOAL #1   Title  Patient will be independent with HEP to continue benefits of therapy after discharge.    Baseline  Requires moderate cueing with performance of exercise; 12/02/2017: moderate cueing to complete HEP    Time  8    Period  Weeks    Status  On-going      PT LONG TERM GOAL #2   Title  Patient will improve TUG to under 12 seconds to indicate signficant improvement in fall risk.     Baseline  TUG: 24sec; 03/26/2017: TUG: 22sec; 05/12/17: 21.7 sec; 05/14/2017: 19sec; 08/26/2016: 14.75; 10/14/2017: 18sec; 12/02/2017: 16sec    Time  8    Period  Weeks    Status  On-going      PT LONG TERM GOAL #3   Title  Patient will improve 17minWT to over 1020ft to indicate functional improvement in walking tolerance and greater ability  to walk in stores before requiring a sitting rest break    Baseline  468ft; 03/26/2017: 525ft; 05/12/17: 459ft (primary deficit is DOE); 07/14/17: 446ft DOE; 08/26/2017: 352ft; 10/14/2017: 443ft ; 12/02/2017: 245ft    Time  8    Period  Weeks    Status  On-going      PT LONG TERM GOAL #4   Title  Patient will demonstrate ability to stand for over 2 min to allow for improved ability to perform household chores at home.     Baseline  able to stand for 50sec before requiring a sitting rest break; 03/26/2017: 60 sec before requiring a break; 05/12/17: 60 seconds per pt subjective report; 07/14/17: 30sec 08/26/2017: 50; 10/14/2017: 10/14/2017: 60sec  12/02/2017: 45sec    Time  8    Period  Weeks    Status  On-going      PT LONG TERM GOAL #5   Title  Pt will decrease 5TSTS to <12 seconds from a chair at 19in in height in order to demonstrate clinically significant improvement in LE strength    Baseline  5xSTS: 14 sec from 22" height chair; 03/26/2017: 12.5sec; 05/12/17: 13.4 sec; 07/14/17: Deffered; 08/26/2017: 21sec from 21" height; 10/14/2017: 19" height 19 sec; 12/02/2017: 15 at a height of 23in    Time  8    Period  Weeks    Status  On-going            Plan - 12/16/17 1339    Clinical Impression Statement  Focused on improving LE strengthening in standing and performing functional movements to increase balance at home. Although patient demonstrates improvement with exercise, she continues to have DOE most notably with ambulation limiting community mobility. Patient will benefit from further skilled therapy to return to prior level of function.     Rehab Potential  Fair    Clinical Impairments Affecting Rehab Potential  Positive: motivation; Negative: age, chronicity, bilateral knee pain    PT Frequency  2x / week    PT Duration  8 weeks    PT Treatment/Interventions  ADLs/Self Care Home Management;Aquatic Therapy;Canalith Repostioning;Cryotherapy;Electrical Stimulation;Iontophoresis 4mg /ml  Dexamethasone;Moist Heat;Traction;Ultrasound;DME Instruction;Gait training;Stair training;Functional mobility training;Therapeutic activities;Therapeutic exercise;Balance training;Neuromuscular re-education;Patient/family education;Wheelchair mobility training;Manual techniques;Passive range of motion;Vestibular    PT Next Visit Plan  Progress strengthening,  reinforce HEP    PT Home Exercise Plan  Cervical retraction    Consulted and Agree with Plan of Care  Patient;Family member/caregiver    Family Member Consulted  Daughter       Patient will benefit from skilled therapeutic intervention in order to improve the following deficits and impairments:  Abnormal gait, Decreased strength, Obesity, Decreased activity tolerance, Decreased endurance, Difficulty walking, Decreased balance, Decreased mobility, Decreased coordination, Increased muscle spasms, Postural dysfunction, Hypomobility, Decreased range of motion  Visit Diagnosis: Muscle weakness (generalized)  Unsteadiness on feet  Difficulty in walking, not elsewhere classified     Problem List Patient Active Problem List   Diagnosis Date Noted  . Cystocele, midline 10/30/2017  . Rectocele 10/30/2017  . Microcytic red blood cells 09/23/2017  . Localized edema 02/13/2017  . Hemorrhoids 05/17/2016  . Obesity (BMI 30-39.9) 04/26/2016  . Multiple lung nodules 04/19/2016  . Chest pain 04/17/2016  . Partial symptomatic epilepsy with complex partial seizures, not intractable, without status epilepticus ([place]) 06/30/2015  . Bronchiectasis without complication ([place]) 34/35/6861  . Imbalance 11/08/2014  . Essential (primary) hypertension 11/08/2014  . Personal history of malignant neoplasm of breast 11/08/2014  . History of malignant carcinoid tumor of bronchus and lung 11/08/2014  . H/O peptic ulcer 11/08/2014  . Asthma, mild intermittent 11/08/2014  . PE (pulmonary embolism) 11/08/2014  . Gonalgia 11/08/2014  . Leg varices 11/08/2014     Blythe Stanford, PT DPT 12/16/2017, 3:14 PM  [facility] [address] [place], [address] Phone: [phone]   Fax:  [phone]  Name: GIAVANNA KANG MRN: [id number] Date of Birth: Nov 01, 1924

## 2017-12-18 NOTE — Telephone Encounter (Signed)
Spoke w/dgtr. Notified pessary is on back order. Will check status & notify her when I can get an estimated date of arrival. [facility] has chosen what he feels is best option for patient. Refitting not necessary.

## 2017-12-23 ENCOUNTER — Ambulatory Visit: Payer: Medicare Other

## 2017-12-23 DIAGNOSIS — M6281 Muscle weakness (generalized): Secondary | ICD-10-CM | POA: Diagnosis not present

## 2017-12-23 DIAGNOSIS — R262 Difficulty in walking, not elsewhere classified: Secondary | ICD-10-CM | POA: Diagnosis not present

## 2017-12-23 DIAGNOSIS — R2681 Unsteadiness on feet: Secondary | ICD-10-CM | POA: Diagnosis not present

## 2017-12-23 NOTE — Therapy (Signed)
[facility] [address] [place], [address] Phone: [phone]   Fax:  [phone]  Physical Therapy Treatment  Patient Details  Name: Rebecca Wolf MRN: [id number] Date of Birth: Jul 03, 1924 Referring Provider: Dr. Sharlet Salina   Encounter Date: 12/23/2017  PT End of Session - 12/23/17 1312    Visit Number  38    Number of Visits  43    Date for PT Re-Evaluation  01/14/18    Authorization Type  Progress note: 3/10    PT Start Time  1300    PT Stop Time  1345    PT Time Calculation (min)  45 min    Equipment Utilized During Treatment  Gait belt    Activity Tolerance  Patient tolerated treatment well    Behavior During Therapy  [facility] for tasks assessed/performed       Past Medical History:  Diagnosis Date  . Asthma   . Breast cancer ([place]) 2003   LT LUMPECTOMY  . Breast cancer ([place]) 2004   LT LUMPECTOMY  . 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  . GERD (gastroesophageal reflux disease)   . Hemorrhoids   . Hypertension   . Personal history of chemotherapy 2004   BREAST CA  . Personal history of radiation therapy 2003   BREAST CA  . Personal history of radiation therapy 2004   BREAST CA  . Polyp of colon 2002   bleeding polyp of right ascending colon  . PUD (peptic ulcer disease)   . Seizures ([place])    epilepsy well controlled    Past Surgical History:  Procedure Laterality Date  . BREAST EXCISIONAL BIOPSY Left 2003   positive  . BREAST EXCISIONAL BIOPSY Left 2004   positive  . BREAST LUMPECTOMY Left 2003   BREAST CA  . BREAST LUMPECTOMY Left 2004   BREAST CA  . HEMICOLECTOMY Right    bowel obstruction  . HEMORRHOID SURGERY    . THORACOTOMY/LOBECTOMY  1999   carcinoid tumor  . TOTAL VAGINAL HYSTERECTOMY    . VARICOSE VEIN SURGERY      There were no vitals filed for this visit.  Subjective Assessment - 12/23/17 1311    Subjective  Patient reports no major changes since the  previous visitation sessions and reports increased stiffness along the L knee.     Pertinent History  Previously seen for balance disorders and for neck pain over the past year. Pt reports R knee feels sore today but is not painful.    Limitations  Walking;Sitting    How long can you walk comfortably?  3 min     Diagnostic tests  None    Patient Stated Goals  To be able to walk without an walker.     Currently in Pain?  No/denies    Pain Onset  1 to 4 weeks ago       TREATMENT    Therapeutic Exercise Nutstep in sitting with focus on improving speed - x 46min; at level 4 to improve strength  Ambulation around the gym - x22ft, x 163ft  Sit to stands - 2x10 with therapist support  Heel raises in standing on airex pad  - x 40 Hip abduction on airex pad - x 10 B  Standing on airex pad - x 3 30sec    Patient demonstrates increased fatigue at the end of the session   PT Education - 12/23/17 1311    Education provided  Yes    Education Details  form/technique with exercise    Person(s) Educated  Patient    Methods  Explanation;Demonstration    Comprehension  Verbalized understanding;Returned demonstration          PT Long Term Goals - 12/02/17 1754      PT LONG TERM GOAL #1   Title  Patient will be independent with HEP to continue benefits of therapy after discharge.    Baseline  Requires moderate cueing with performance of exercise; 12/02/2017: moderate cueing to complete HEP    Time  8    Period  Weeks    Status  On-going      PT LONG TERM GOAL #2   Title  Patient will improve TUG to under 12 seconds to indicate signficant improvement in fall risk.     Baseline  TUG: 24sec; 03/26/2017: TUG: 22sec; 05/12/17: 21.7 sec; 05/14/2017: 19sec; 08/26/2016: 14.75; 10/14/2017: 18sec; 12/02/2017: 16sec    Time  8    Period  Weeks    Status  On-going      PT LONG TERM GOAL #3   Title  Patient will improve 20minWT to over 1018ft to indicate functional improvement in walking tolerance and  greater ability to walk in stores before requiring a sitting rest break    Baseline  422ft; 03/26/2017: 528ft; 05/12/17: 479ft (primary deficit is DOE); 07/14/17: 466ft DOE; 08/26/2017: 359ft; 10/14/2017: 422ft ; 12/02/2017: 216ft    Time  8    Period  Weeks    Status  On-going      PT LONG TERM GOAL #4   Title  Patient will demonstrate ability to stand for over 2 min to allow for improved ability to perform household chores at home.     Baseline  able to stand for 50sec before requiring a sitting rest break; 03/26/2017: 60 sec before requiring a break; 05/12/17: 60 seconds per pt subjective report; 07/14/17: 30sec 08/26/2017: 50; 10/14/2017: 10/14/2017: 60sec  12/02/2017: 45sec    Time  8    Period  Weeks    Status  On-going      PT LONG TERM GOAL #5   Title  Pt will decrease 5TSTS to <12 seconds from a chair at 19in in height in order to demonstrate clinically significant improvement in LE strength    Baseline  5xSTS: 14 sec from 22" height chair; 03/26/2017: 12.5sec; 05/12/17: 13.4 sec; 07/14/17: Deffered; 08/26/2017: 21sec from 21" height; 10/14/2017: 19" height 19 sec; 12/02/2017: 15 at a height of 23in    Time  8    Period  Weeks    Status  On-going            Plan - 12/23/17 1336    Clinical Impression Statement  Patient demonstrates improvement with ability to perform greater amount of exercises compared to the previous session. Patient demonstrates decreased balance in standing most noteably on the airex pad. Patient demonstratres poor endurance in standing and requires elongated sitting rest breaks. Patient will benefit from further skilled therapy to return to prior level of function.     Rehab Potential  Fair    Clinical Impairments Affecting Rehab Potential  Positive: motivation; Negative: age, chronicity, bilateral knee pain    PT Frequency  2x / week    PT Duration  8 weeks    PT Treatment/Interventions  ADLs/Self Care Home Management;Aquatic Therapy;Canalith  Repostioning;Cryotherapy;Electrical Stimulation;Iontophoresis 4mg /ml Dexamethasone;Moist Heat;Traction;Ultrasound;DME Instruction;Gait training;Stair training;Functional mobility training;Therapeutic activities;Therapeutic exercise;Balance training;Neuromuscular re-education;Patient/family education;Wheelchair mobility training;Manual techniques;Passive range of motion;Vestibular  PT Next Visit Plan  Progress strengthening, reinforce HEP    PT Home Exercise Plan  Cervical retraction    Consulted and Agree with Plan of Care  Patient;Family member/caregiver    Family Member Consulted  Daughter       Patient will benefit from skilled therapeutic intervention in order to improve the following deficits and impairments:  Abnormal gait, Decreased strength, Obesity, Decreased activity tolerance, Decreased endurance, Difficulty walking, Decreased balance, Decreased mobility, Decreased coordination, Increased muscle spasms, Postural dysfunction, Hypomobility, Decreased range of motion  Visit Diagnosis: Muscle weakness (generalized)  Unsteadiness on feet  Difficulty in walking, not elsewhere classified     Problem List Patient Active Problem List   Diagnosis Date Noted  . Cystocele, midline 10/30/2017  . Rectocele 10/30/2017  . Microcytic red blood cells 09/23/2017  . Localized edema 02/13/2017  . Hemorrhoids 05/17/2016  . Obesity (BMI 30-39.9) 04/26/2016  . Multiple lung nodules 04/19/2016  . Chest pain 04/17/2016  . Partial symptomatic epilepsy with complex partial seizures, not intractable, without status epilepticus ([place]) 06/30/2015  . Bronchiectasis without complication ([place]) 60/09/5995  . Imbalance 11/08/2014  . Essential (primary) hypertension 11/08/2014  . Personal history of malignant neoplasm of breast 11/08/2014  . History of malignant carcinoid tumor of bronchus and lung 11/08/2014  . H/O peptic ulcer 11/08/2014  . Asthma, mild intermittent 11/08/2014  . PE (pulmonary  embolism) 11/08/2014  . Gonalgia 11/08/2014  . Leg varices 11/08/2014    Blythe Stanford, PT DPT 12/23/2017, 1:49 PM  [facility] [address], [place], [address] Phone: [phone]   Fax:  [phone]  Name: Rebecca Wolf MRN: [id number] Date of Birth: 09-17-1924

## 2017-12-25 ENCOUNTER — Other Ambulatory Visit: Payer: Self-pay | Admitting: Gastroenterology

## 2017-12-25 ENCOUNTER — Ambulatory Visit (INDEPENDENT_AMBULATORY_CARE_PROVIDER_SITE_OTHER): Payer: Medicare Other | Admitting: Gastroenterology

## 2017-12-25 ENCOUNTER — Encounter: Payer: Self-pay | Admitting: Gastroenterology

## 2017-12-25 VITALS — BP 120/82 | HR 96 | Ht 63.0 in | Wt 172.8 lb

## 2017-12-25 DIAGNOSIS — R1012 Left upper quadrant pain: Secondary | ICD-10-CM

## 2017-12-25 MED ORDER — SUCRALFATE 1 G PO TABS: 1.0000 g | ORAL_TABLET | Freq: Three times a day (TID) | ORAL | 2 refills | Status: DC

## 2017-12-25 NOTE — Progress Notes (Signed)
Jonathon Bellows MD, MRCP(U.K) [address]  [place]  [place], [place] [address]  Main: [phone]  Fax: [phone]   Gastroenterology Consultation  Referring Provider:     Glean Hess, MD Primary Care Physician:  Glean Hess, MD Primary Gastroenterologist:  Dr. Jonathon Bellows  Reason for Consultation:     Abdominal pain         HPI:   Rebecca Wolf is a 84 y.o. y/o female referred for consultation & management  by Dr. Army Melia, Jesse Sans, MD.   She has been referred for abdominal pain .   Last CT scan of the chest in 05/2017 shows pulmonary nodules.Vascular calcifications in the splenic artery, hilum and small hiatal hernia. Hb 12 grams 3 months back. H/o cholecystectomy, Rt hemicolectomy , thoracotomy,RUL carcinoid resected in the past.  She used to see [facility] in the past for fecal incontinence.   She has also been seen by me in 07/2016 for diarrhea and abdominal pain of 1 week duration.   She says that her abdomen has been hurting for a month on and off. , worse when she drinks cold water or after she eats. Usually within a few minutes. Left sided abdominal pain. Localized . Describes the pain as dull in  [place]. Goes away on its on . She has a bowel movement every 3 hours-formed to loose. Better after a bowel movement . No NSAID's. She takes Protonix daily Twice. Helps her. Carafate helped.    Past Medical History:  Diagnosis Date  . Asthma   . Breast cancer ([place]) 2003   LT LUMPECTOMY  . Breast cancer ([place]) 2004   LT LUMPECTOMY  . 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  . GERD (gastroesophageal reflux disease)   . Hemorrhoids   . Hypertension   . Personal history of chemotherapy 2004   BREAST CA  . Personal history of radiation therapy 2003   BREAST CA  . Personal history of radiation therapy 2004   BREAST CA  . Polyp of colon 2002   bleeding polyp of right ascending colon  . PUD (peptic ulcer disease)   . Seizures ([place])    epilepsy  well controlled    Past Surgical History:  Procedure Laterality Date  . BREAST EXCISIONAL BIOPSY Left 2003   positive  . BREAST EXCISIONAL BIOPSY Left 2004   positive  . BREAST LUMPECTOMY Left 2003   BREAST CA  . BREAST LUMPECTOMY Left 2004   BREAST CA  . HEMICOLECTOMY Right    bowel obstruction  . HEMORRHOID SURGERY    . THORACOTOMY/LOBECTOMY  1999   carcinoid tumor  . TOTAL VAGINAL HYSTERECTOMY    . VARICOSE VEIN SURGERY      Prior to Admission medications   Medication Sig Start Date End Date Taking? Authorizing Provider  acetaminophen (TYLENOL) 500 MG tablet Take 500 mg by mouth every 6 (six) hours as needed.   Yes [provider]  albuterol (ACCUNEB) 1.25 MG/3ML nebulizer solution Inhale 1 ampule into the lungs 4 (four) times daily as needed. 05/06/14  Yes [provider]  amLODipine (NORVASC) 5 MG tablet TAKE 1 TABLET BY MOUTH EVERY DAY 11/26/17  Yes Glean Hess, MD  ampicillin (PRINCIPEN) 250 MG capsule Take by mouth.   Yes [provider]  cefUROXime (CEFTIN) 500 MG tablet  09/16/17  Yes [provider]  diltiazem (CARDIZEM CD) 120 MG 24 hr capsule Take by mouth. 07/07/17 07/07/18 Yes [provider]  ELIQUIS 2.5 MG TABS tablet TAKE 1 TABLET BY MOUTH TWICE DAILY 10/28/17  Yes Glean Hess, MD  fluticasone [facility]) 50 MCG/ACT nasal spray Place 2 sprays into the nose daily.   Yes [provider]  Fluticasone-Salmeterol (ADVAIR) 500-50 MCG/DOSE AEPB Inhale 1 puff into the lungs 2 (two) times daily.   Yes [provider]  Fluticasone-Salmeterol (WIXELA INHUB) 500-50 MCG/DOSE AEPB Inhale 1 puff into the lungs 2 (two) times daily.   Yes [provider]  levETIRAcetam (KEPPRA) 750 MG tablet Take 1 tablet by mouth 2 (two) times daily. 06/29/13  Yes [provider]  montelukast (SINGULAIR) 10 MG tablet Take 1 tablet by mouth daily.   Yes [provider]  Olopatadine HCl (PATANASE) 0.6 %  SOLN Place 1 puff into the nose 2 (two) times daily.   Yes [provider]  pantoprazole (PROTONIX) 40 MG tablet TAKE 1 TABLET BY MOUTH TWICE DAILY 06/21/17  Yes Glean Hess, MD  prednisoLONE acetate (PRED FORTE) 1 % ophthalmic suspension INTILL 2 GTS INTO OU QID 09/23/17  Yes [provider]  spironolactone-hydrochlorothiazide (ALDACTAZIDE) 25-25 MG tablet TAKE 1 TABLET BY MOUTH DAILY 11/28/17  Yes Glean Hess, MD  sucralfate (CARAFATE) 1 g tablet Take 1 tablet (1 g total) by mouth 4 (four) times daily -  with meals and at bedtime. 08/20/16  Yes Jonathon Bellows, MD    Family History  Problem Relation Age of Onset  . Alcoholism Father   . Diabetes Sister   . Breast cancer Neg Hx      Social History   Tobacco Use  . Smoking status: Former Research scientist (life sciences)  . Smokeless tobacco: Never Used  Substance Use Topics  . Alcohol use: No    Alcohol/week: 0.0 oz  . Drug use: No    Allergies as of 12/25/2017 - Review Complete 12/25/2017  Allergen Reaction Noted  . Levofloxacin Shortness Of Breath 11/08/2014  . Sulfa antibiotics Rash 11/08/2014    Review of Systems:    All systems reviewed and negative except where noted in HPI.   Physical Exam:  BP 120/82 (BP Location: Left Arm, Patient Position: Sitting, Cuff Size: Large)   Pulse 96   Ht 5\' 3"  (1.6 m)   Wt 172 lb 12.8 oz (78.4 kg)   BMI 30.61 kg/m  No LMP recorded. Patient has had a hysterectomy. Psych:  Alert and cooperative. Normal mood and affect. General:   Alert,  Well-developed, well-nourished, pleasant and cooperative in NAD Head:  Normocephalic and atraumatic. Eyes:  Sclera clear, no icterus.   Conjunctiva pink. Ears:  Normal auditory acuity. Nose:  No deformity, discharge, or lesions. Mouth:  No deformity or lesions,oropharynx pink & moist. Neck:  Supple; no masses or thyromegaly. Lungs:  Respirations even and unlabored.  Clear throughout to auscultation.   No wheezes, crackles, or rhonchi. No acute  distress. Heart:  Regular rate and rhythm; no murmurs, clicks, rubs, or gallops. Abdomen:  Normal bowel sounds.  No bruits.  Soft, non-tender and non-distended without masses, hepatosplenomegaly or hernias noted.  No guarding or rebound tenderness.    Neurologic:  Alert and oriented x3;  grossly normal neurologically. Skin:  Intact without significant lesions or rashes. No jaundice. Lymph Nodes:  No significant cervical adenopathy. Psych:  Alert and cooperative. Normal mood and affect.  Imaging Studies: No results found.  Assessment and Plan:   Rebecca Wolf is a 84 y.o. y/o female has been referred for abdominal pain. Not severe. Possible IBS. At  her age she agrees she does not wish to opt for any endoscopy or imaging at this time due to risks. She would like toi start with empiric treatment and if no better will obtain imaging .   Plan  1. Carafate 2. IB guard   Follow up in 4 weeks   Dr Jonathon Bellows MD,MRCP(U.K)

## 2017-12-28 ENCOUNTER — Other Ambulatory Visit: Payer: Self-pay | Admitting: Internal Medicine

## 2017-12-31 ENCOUNTER — Ambulatory Visit: Payer: Medicare Other | Attending: Physical Medicine and Rehabilitation

## 2017-12-31 DIAGNOSIS — M6281 Muscle weakness (generalized): Secondary | ICD-10-CM

## 2017-12-31 DIAGNOSIS — R262 Difficulty in walking, not elsewhere classified: Secondary | ICD-10-CM | POA: Insufficient documentation

## 2017-12-31 DIAGNOSIS — R2681 Unsteadiness on feet: Secondary | ICD-10-CM | POA: Insufficient documentation

## 2017-12-31 NOTE — Therapy (Signed)
[facility] [address], [place], [address] Phone: [phone]   Fax:  [phone]  Physical Therapy Treatment  Patient Details  Name: Rebecca Wolf MRN: [id number] Date of Birth: 1924-09-27 Referring Provider: Dr. Sharlet Salina   Encounter Date: 12/31/2017  PT End of Session - 12/31/17 1050    Visit Number  39    Number of Visits  43    Date for PT Re-Evaluation  01/14/18    Authorization Type  Progress note: 4/10    PT Start Time  1035    PT Stop Time  1115    PT Time Calculation (min)  40 min    Equipment Utilized During Treatment  Gait belt    Activity Tolerance  Patient tolerated treatment well    Behavior During Therapy  [facility] for tasks assessed/performed       Past Medical History:  Diagnosis Date  . Asthma   . Breast cancer ([place]) 2003   LT LUMPECTOMY  . Breast cancer ([place]) 2004   LT LUMPECTOMY  . 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  . GERD (gastroesophageal reflux disease)   . Hemorrhoids   . Hypertension   . Personal history of chemotherapy 2004   BREAST CA  . Personal history of radiation therapy 2003   BREAST CA  . Personal history of radiation therapy 2004   BREAST CA  . Polyp of colon 2002   bleeding polyp of right ascending colon  . PUD (peptic ulcer disease)   . Seizures ([place])    epilepsy well controlled    Past Surgical History:  Procedure Laterality Date  . BREAST EXCISIONAL BIOPSY Left 2003   positive  . BREAST EXCISIONAL BIOPSY Left 2004   positive  . BREAST LUMPECTOMY Left 2003   BREAST CA  . BREAST LUMPECTOMY Left 2004   BREAST CA  . HEMICOLECTOMY Right    bowel obstruction  . HEMORRHOID SURGERY    . THORACOTOMY/LOBECTOMY  1999   carcinoid tumor  . TOTAL VAGINAL HYSTERECTOMY    . VARICOSE VEIN SURGERY      There were no vitals filed for this visit.  Subjective Assessment - 12/31/17 1042    Subjective  Patient reports she has been walking around  [place] and states her knee has been feeling better but continues to bother her infrequently.     Pertinent History  Previously seen for balance disorders and for neck pain over the past year. Pt reports R knee feels sore today but is not painful.    Limitations  Walking;Sitting    How long can you walk comfortably?  3 min     Diagnostic tests  None    Patient Stated Goals  To be able to walk without an walker.     Currently in Pain?  No/denies    Pain Onset  1 to 4 weeks ago        TREATMENT    Therapeutic Exercise Nutstep in sitting with focus on improving speed - x 42min; at level 4 to improve strength  Ambulation around the gym - x196ft, x 1100ft Seated knee extension -2 x 15 5# Standing marches in standing - x 15  Hip abduction on airex pad - x 10 B  Hip extension in standing - x 10 B  Seated ball glute squeeze with 2kg - x 20    Patient demonstrates increased fatigue at the end of the session   PT  Education - 12/31/17 1050    Education provided  Yes    Education Details  form/technique with exercise    Person(s) Educated  Patient    Methods  Explanation;Demonstration    Comprehension  Verbalized understanding;Returned demonstration          PT Long Term Goals - 12/02/17 1754      PT LONG TERM GOAL #1   Title  Patient will be independent with HEP to continue benefits of therapy after discharge.    Baseline  Requires moderate cueing with performance of exercise; 12/02/2017: moderate cueing to complete HEP    Time  8    Period  Weeks    Status  On-going      PT LONG TERM GOAL #2   Title  Patient will improve TUG to under 12 seconds to indicate signficant improvement in fall risk.     Baseline  TUG: 24sec; 03/26/2017: TUG: 22sec; 05/12/17: 21.7 sec; 05/14/2017: 19sec; 08/26/2016: 14.75; 10/14/2017: 18sec; 12/02/2017: 16sec    Time  8    Period  Weeks    Status  On-going      PT LONG TERM GOAL #3   Title  Patient will improve 70minWT to over 1076ft to indicate  functional improvement in walking tolerance and greater ability to walk in stores before requiring a sitting rest break    Baseline  454ft; 03/26/2017: 530ft; 05/12/17: 443ft (primary deficit is DOE); 07/14/17: 467ft DOE; 08/26/2017: 383ft; 10/14/2017: 438ft ; 12/02/2017: 28ft    Time  8    Period  Weeks    Status  On-going      PT LONG TERM GOAL #4   Title  Patient will demonstrate ability to stand for over 2 min to allow for improved ability to perform household chores at home.     Baseline  able to stand for 50sec before requiring a sitting rest break; 03/26/2017: 60 sec before requiring a break; 05/12/17: 60 seconds per pt subjective report; 07/14/17: 30sec 08/26/2017: 50; 10/14/2017: 10/14/2017: 60sec  12/02/2017: 45sec    Time  8    Period  Weeks    Status  On-going      PT LONG TERM GOAL #5   Title  Pt will decrease 5TSTS to <12 seconds from a chair at 19in in height in order to demonstrate clinically significant improvement in LE strength    Baseline  5xSTS: 14 sec from 22" height chair; 03/26/2017: 12.5sec; 05/12/17: 13.4 sec; 07/14/17: Deffered; 08/26/2017: 21sec from 21" height; 10/14/2017: 19" height 19 sec; 12/02/2017: 15 at a height of 23in    Time  8    Period  Weeks    Status  On-going            Plan - 12/31/17 1100    Clinical Impression Statement  Patient demonstrates increased knee pain with exercises today requiring greater sitting rest breaks throughout the session; performed greater amount of sitting rest breaks. Although patient required greater amount of rest breaks with exercise, she was able to perform the same amount of exercises as the previous session. Patient demonstrated increased unsteadiness with ambulation requiring greater UE support compared to previous sessions, indicating decreased dynamic balance. Patient will benefit from further skilled therapy to return to prior level of function.      Rehab Potential  Fair    Clinical Impairments Affecting Rehab Potential   Positive: motivation; Negative: age, chronicity, bilateral knee pain    PT Frequency  2x / week    PT  Duration  8 weeks    PT Treatment/Interventions  ADLs/Self Care Home Management;Aquatic Therapy;Canalith Repostioning;Cryotherapy;Electrical Stimulation;Iontophoresis 4mg /ml Dexamethasone;Moist Heat;Traction;Ultrasound;DME Instruction;Gait training;Stair training;Functional mobility training;Therapeutic activities;Therapeutic exercise;Balance training;Neuromuscular re-education;Patient/family education;Wheelchair mobility training;Manual techniques;Passive range of motion;Vestibular    PT Next Visit Plan  Progress strengthening, reinforce HEP    PT Home Exercise Plan  Cervical retraction    Consulted and Agree with Plan of Care  Patient;Family member/caregiver    Family Member Consulted  Daughter       Patient will benefit from skilled therapeutic intervention in order to improve the following deficits and impairments:  Abnormal gait, Decreased strength, Obesity, Decreased activity tolerance, Decreased endurance, Difficulty walking, Decreased balance, Decreased mobility, Decreased coordination, Increased muscle spasms, Postural dysfunction, Hypomobility, Decreased range of motion  Visit Diagnosis: Muscle weakness (generalized)  Unsteadiness on feet  Difficulty in walking, not elsewhere classified     Problem List Patient Active Problem List   Diagnosis Date Noted  . Cystocele, midline 10/30/2017  . Rectocele 10/30/2017  . Microcytic red blood cells 09/23/2017  . Localized edema 02/13/2017  . Hemorrhoids 05/17/2016  . Obesity (BMI 30-39.9) 04/26/2016  . Multiple lung nodules 04/19/2016  . Chest pain 04/17/2016  . Partial symptomatic epilepsy with complex partial seizures, not intractable, without status epilepticus ([place]) 06/30/2015  . Bronchiectasis without complication ([place]) 27/12/8673  . Imbalance 11/08/2014  . Essential (primary) hypertension 11/08/2014  . Personal history of  malignant neoplasm of breast 11/08/2014  . History of malignant carcinoid tumor of bronchus and lung 11/08/2014  . H/O peptic ulcer 11/08/2014  . Asthma, mild intermittent 11/08/2014  . PE (pulmonary embolism) 11/08/2014  . Gonalgia 11/08/2014  . Leg varices 11/08/2014    Blythe Stanford, PT DPT 12/31/2017, 1:10 PM  [facility] [address], [place], [address] Phone: [phone]   Fax:  [phone]  Name: DARIENNE BELLEAU MRN: [id number] Date of Birth: Apr 30, 1925

## 2018-01-06 ENCOUNTER — Other Ambulatory Visit: Payer: Self-pay | Admitting: Physician Assistant

## 2018-01-06 ENCOUNTER — Ambulatory Visit
Admission: RE | Admit: 2018-01-06 | Discharge: 2018-01-06 | Disposition: A | Discharge status: Home / Self Care | Payer: Medicare Other | Source: Ambulatory Visit | Attending: Physician Assistant | Admitting: Physician Assistant

## 2018-01-06 DIAGNOSIS — Z86718 Personal history of other venous thrombosis and embolism: Secondary | ICD-10-CM | POA: Insufficient documentation

## 2018-01-06 DIAGNOSIS — R002 Palpitations: Secondary | ICD-10-CM | POA: Insufficient documentation

## 2018-01-06 DIAGNOSIS — R000 Tachycardia, unspecified: Secondary | ICD-10-CM | POA: Insufficient documentation

## 2018-01-06 DIAGNOSIS — R600 Localized edema: Secondary | ICD-10-CM

## 2018-01-06 DIAGNOSIS — R0602 Shortness of breath: Secondary | ICD-10-CM | POA: Diagnosis not present

## 2018-01-07 ENCOUNTER — Ambulatory Visit: Payer: Medicare Other

## 2018-01-12 ENCOUNTER — Telehealth: Payer: Self-pay | Admitting: Gastroenterology

## 2018-01-12 NOTE — Telephone Encounter (Signed)
Pt daughter is calling to get pt referred to [facility] please fax to  Fax#  [phone]

## 2018-01-13 DIAGNOSIS — M5412 Radiculopathy, cervical region: Secondary | ICD-10-CM | POA: Diagnosis not present

## 2018-01-13 DIAGNOSIS — M170 Bilateral primary osteoarthritis of knee: Secondary | ICD-10-CM | POA: Diagnosis not present

## 2018-01-13 DIAGNOSIS — M5030 Other cervical disc degeneration, unspecified cervical region: Secondary | ICD-10-CM | POA: Diagnosis not present

## 2018-01-15 ENCOUNTER — Telehealth: Payer: Self-pay

## 2018-01-15 DIAGNOSIS — Z86718 Personal history of other venous thrombosis and embolism: Secondary | ICD-10-CM | POA: Diagnosis not present

## 2018-01-15 DIAGNOSIS — R011 Cardiac murmur, unspecified: Secondary | ICD-10-CM | POA: Diagnosis not present

## 2018-01-15 DIAGNOSIS — R0602 Shortness of breath: Secondary | ICD-10-CM | POA: Diagnosis not present

## 2018-01-15 DIAGNOSIS — G40309 Generalized idiopathic epilepsy and epileptic syndromes, not intractable, without status epilepticus: Secondary | ICD-10-CM | POA: Diagnosis not present

## 2018-01-15 DIAGNOSIS — R002 Palpitations: Secondary | ICD-10-CM | POA: Diagnosis not present

## 2018-01-15 DIAGNOSIS — R600 Localized edema: Secondary | ICD-10-CM | POA: Diagnosis not present

## 2018-01-15 DIAGNOSIS — R9431 Abnormal electrocardiogram [ECG] [EKG]: Secondary | ICD-10-CM | POA: Diagnosis not present

## 2018-01-15 DIAGNOSIS — R000 Tachycardia, unspecified: Secondary | ICD-10-CM | POA: Diagnosis not present

## 2018-01-15 DIAGNOSIS — K219 Gastro-esophageal reflux disease without esophagitis: Secondary | ICD-10-CM | POA: Diagnosis not present

## 2018-01-15 DIAGNOSIS — R601 Generalized edema: Secondary | ICD-10-CM | POA: Diagnosis not present

## 2018-01-15 NOTE — Telephone Encounter (Signed)
Dr. Vicente Males,  Patients daughter [name]) called to see if we could send a referral for her mother to [facility] for her mothers ongoing stomach problems.  I asked her daughter if referral request was discussed during office visit and she said no.  She would like the referral because she was not pleased with the care she received with our office.    Please advise on referral to [facility].  Thanks Luz Brazen  [phone]

## 2018-01-15 NOTE — Telephone Encounter (Signed)
[name] pt dgtr calling to f/u on pessary.

## 2018-01-15 NOTE — Telephone Encounter (Signed)
Please inform that a referral would need to be made by her primary care physician to another gi   Would be happy to discuss with her if any of her concerns could be addressed better and help her mother by our practice

## 2018-01-16 NOTE — Telephone Encounter (Signed)
Contacted patients daughter and expressed that we would be more than happy to discuss any of her concerns to be address and help her mother by our practice.  She appreciated this, and said that her mother just would prefer [facility].  I asked her to contact her mothers PCP to request another GI physician because it has to come from her primary.  Thanks [employer]

## 2018-01-19 ENCOUNTER — Other Ambulatory Visit: Payer: Self-pay | Admitting: Internal Medicine

## 2018-01-19 DIAGNOSIS — R1084 Generalized abdominal pain: Secondary | ICD-10-CM

## 2018-01-20 ENCOUNTER — Ambulatory Visit: Payer: Medicare Other | Admitting: Podiatry

## 2018-01-20 ENCOUNTER — Ambulatory Visit: Payer: Medicare Other

## 2018-01-20 DIAGNOSIS — R2681 Unsteadiness on feet: Secondary | ICD-10-CM

## 2018-01-20 DIAGNOSIS — R262 Difficulty in walking, not elsewhere classified: Secondary | ICD-10-CM

## 2018-01-20 DIAGNOSIS — M6281 Muscle weakness (generalized): Secondary | ICD-10-CM

## 2018-01-20 NOTE — Therapy (Signed)
[facility] [address], [place], [address] Phone: [phone]   Fax:  [phone]  Physical Therapy Treatment/Progress Note  Patient Details  Name: Rebecca Wolf MRN: [id number] Date of Birth: 1925/05/26 Referring Provider: Dr. Sharlet Salina   Encounter Date: 01/20/2018  PT End of Session - 01/20/18 1401    Visit Number  40    Number of Visits  51    Date for PT Re-Evaluation  03/17/18    Authorization Type  Progress note today    PT Start Time  1352    PT Stop Time  1430    PT Time Calculation (min)  38 min    Equipment Utilized During Treatment  Gait belt    Activity Tolerance  Patient tolerated treatment well    Behavior During Therapy  [facility] for tasks assessed/performed       Past Medical History:  Diagnosis Date  . Asthma   . Breast cancer ([place]) 2003   LT LUMPECTOMY  . Breast cancer ([place]) 2004   LT LUMPECTOMY  . 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  . GERD (gastroesophageal reflux disease)   . Hemorrhoids   . Hypertension   . Personal history of chemotherapy 2004   BREAST CA  . Personal history of radiation therapy 2003   BREAST CA  . Personal history of radiation therapy 2004   BREAST CA  . Polyp of colon 2002   bleeding polyp of right ascending colon  . PUD (peptic ulcer disease)   . Seizures ([place])    epilepsy well controlled    Past Surgical History:  Procedure Laterality Date  . BREAST EXCISIONAL BIOPSY Left 2003   positive  . BREAST EXCISIONAL BIOPSY Left 2004   positive  . BREAST LUMPECTOMY Left 2003   BREAST CA  . BREAST LUMPECTOMY Left 2004   BREAST CA  . HEMICOLECTOMY Right    bowel obstruction  . HEMORRHOID SURGERY    . THORACOTOMY/LOBECTOMY  1999   carcinoid tumor  . TOTAL VAGINAL HYSTERECTOMY    . VARICOSE VEIN SURGERY      There were no vitals filed for this visit.  Subjective Assessment - 01/20/18 1359    Subjective  Patient reports no major  changes siince the previous session and states she had to have a holter monitor placed last week for preventative reasons. Patient reports she's been feeling weak. Patient states she has been having increased knee pain and had a shot in her knee to decrease pain.     Pertinent History  Previously seen for balance disorders and for neck pain over the past year. Pt reports R knee feels sore today but is not painful.    Limitations  Walking;Sitting    How long can you walk comfortably?  3 min     Diagnostic tests  None    Patient Stated Goals  To be able to walk without an walker.     Currently in Pain?  No/denies    Pain Onset  1 to 4 weeks ago       TREATMENT Therapeutic Exercise  Stand up and ambulation 20 ft -- x 5  Ambulation 448ft with focus on improving speed of performance Standing for time -- 2 x 40sec  Nustep performed for 10 min to improve LE strength at level 4  Sit to stands performed with focus on improving speed -- 2 x 5   Patient demonstrates increased fatigue at the  end of the session     PT Education - 01/20/18 1401    Education provided  Yes    Education Details  form/technique with exercise    Person(s) Educated  Patient    Methods  Explanation;Demonstration    Comprehension  Verbalized understanding;Returned demonstration          PT Long Term Goals - 01/20/18 1403      PT LONG TERM GOAL #1   Title  Patient will be independent with HEP to continue benefits of therapy after discharge.    Baseline  Requires moderate cueing with performance of exercise; 12/02/2017: moderate cueing to complete HEP; 01/20/2018: Performs HEP with ambulation    Time  8    Period  Weeks    Status  Achieved      PT LONG TERM GOAL #2   Title  Patient will improve TUG to under 12 seconds to indicate signficant improvement in fall risk.     Baseline  TUG: 24sec; 03/26/2017: TUG: 22sec; 05/12/17: 21.7 sec; 05/14/2017: 19sec; 08/26/2016: 14.75; 10/14/2017: 18sec; 12/02/2017: 16sec;  01/20/2018:    Time  8    Period  Weeks    Status  On-going      PT LONG TERM GOAL #3   Title  Patient will improve 27minWT to over 1022ft to indicate functional improvement in walking tolerance and greater ability to walk in stores before requiring a sitting rest break    Baseline  413ft; 03/26/2017: 515ft; 05/12/17: 462ft (primary deficit is DOE); 07/14/17: 435ft DOE; 08/26/2017: 347ft; 10/14/2017: 457ft ; 12/02/2017: 265ft; 01/20/2018: 410ft    Time  8    Period  Weeks    Status  On-going      PT LONG TERM GOAL #4   Title  Patient will demonstrate ability to stand for over 2 min to allow for improved ability to perform household chores at home.     Baseline  able to stand for 50sec before requiring a sitting rest break; 03/26/2017: 60 sec before requiring a break; 05/12/17: 60 seconds per pt subjective report; 07/14/17: 30sec 08/26/2017: 50sec; 10/14/2017: 60sec  12/02/2017: 45sec; 01/20/2018: 38sec    Time  8    Period  Weeks    Status  On-going      PT LONG TERM GOAL #5   Title  Pt will decrease 5TSTS to <12 seconds from a chair at 19in in height in order to demonstrate clinically significant improvement in LE strength    Baseline  5xSTS: 14 sec from 22" height chair; 03/26/2017: 12.5sec; 05/12/17: 13.4 sec; 07/14/17: Deffered; 08/26/2017: 21sec from 21" height; 10/14/2017: 19" height 19 sec; 12/02/2017: 15;  at a height of 23in; 01/20/2018: 13sec from 23in chair     Time  8    Period  Weeks    Status  On-going            Plan - 01/20/18 1551    Clinical Impression Statement  Patient is making progress towards long term goals with greater ability to perform 5xSTS and 43minWT indicating improvement with endurance with ambulating and functional lower extremith strength. Patient continues to have increased difficulty with standing endurance limiting ability to perform standing funcitonal motions such as preapring meals in standing. Patient also reports difficulty of performing walking motions when she  has to skip  physical therapy, indicating further need. Patient will benefit from further skilled therapy to return to prior level of function.     Rehab Potential  Fair  Clinical Impairments Affecting Rehab Potential  Positive: motivation; Negative: age, chronicity, bilateral knee pain    PT Frequency  2x / week    PT Duration  8 weeks    PT Treatment/Interventions  ADLs/Self Care Home Management;Aquatic Therapy;Canalith Repostioning;Cryotherapy;Electrical Stimulation;Iontophoresis 4mg /ml Dexamethasone;Moist Heat;Traction;Ultrasound;DME Instruction;Gait training;Stair training;Functional mobility training;Therapeutic activities;Therapeutic exercise;Balance training;Neuromuscular re-education;Patient/family education;Wheelchair mobility training;Manual techniques;Passive range of motion;Vestibular    PT Next Visit Plan  Progress strengthening, reinforce HEP    PT Home Exercise Plan  Cervical retraction    Consulted and Agree with Plan of Care  Patient;Family member/caregiver    Family Member Consulted  Daughter       Patient will benefit from skilled therapeutic intervention in order to improve the following deficits and impairments:  Abnormal gait, Decreased strength, Obesity, Decreased activity tolerance, Decreased endurance, Difficulty walking, Decreased balance, Decreased mobility, Decreased coordination, Increased muscle spasms, Postural dysfunction, Hypomobility, Decreased range of motion  Visit Diagnosis: Muscle weakness (generalized)  Unsteadiness on feet  Difficulty in walking, not elsewhere classified     Problem List Patient Active Problem List   Diagnosis Date Noted  . Heart palpitations 01/06/2018  . Cystocele, midline 10/30/2017  . Rectocele 10/30/2017  . Microcytic red blood cells 09/23/2017  . Localized edema 02/13/2017  . Hemorrhoids 05/17/2016  . Obesity (BMI 30-39.9) 04/26/2016  . Multiple lung nodules 04/19/2016  . Chest pain 04/17/2016  . Partial  symptomatic epilepsy with complex partial seizures, not intractable, without status epilepticus ([place]) 06/30/2015  . Bronchiectasis without complication ([place]) 69/67/8938  . Imbalance 11/08/2014  . Essential (primary) hypertension 11/08/2014  . Personal history of malignant neoplasm of breast 11/08/2014  . History of malignant carcinoid tumor of bronchus and lung 11/08/2014  . H/O peptic ulcer 11/08/2014  . Asthma, mild intermittent 11/08/2014  . PE (pulmonary embolism) 11/08/2014  . Gonalgia 11/08/2014  . Leg varices 11/08/2014    Blythe Stanford, PT DPT 01/20/2018, 4:20 PM  [facility] [address] [place], [address] Phone: [phone]   Fax:  [phone]  Name: Rebecca Wolf MRN: [id number] Date of Birth: 07/06/1924

## 2018-01-23 ENCOUNTER — Ambulatory Visit: Payer: Medicare Other | Admitting: Podiatry

## 2018-01-23 NOTE — Telephone Encounter (Signed)
Attempt to reach to explain pessary must still be on back order. Left voicemail to call back .

## 2018-01-26 NOTE — Telephone Encounter (Signed)
Please advise Patient has been waiting since May for pessary

## 2018-01-26 NOTE — Telephone Encounter (Signed)
Can you tell me the size of the [place] pessary, so I can call and find out what's going on what's the delay. Please and Thank you

## 2018-01-27 ENCOUNTER — Other Ambulatory Visit: Payer: Self-pay | Admitting: Internal Medicine

## 2018-01-27 NOTE — Telephone Encounter (Signed)
Waiting to her from [place] on where to call for pessary

## 2018-01-27 NOTE — Telephone Encounter (Signed)
Pessary has been ordered 

## 2018-01-31 ENCOUNTER — Other Ambulatory Visit: Payer: Self-pay | Admitting: Internal Medicine

## 2018-02-02 ENCOUNTER — Ambulatory Visit: Payer: Medicare Other | Attending: Physical Medicine and Rehabilitation

## 2018-02-02 DIAGNOSIS — R262 Difficulty in walking, not elsewhere classified: Secondary | ICD-10-CM | POA: Insufficient documentation

## 2018-02-02 DIAGNOSIS — M6281 Muscle weakness (generalized): Secondary | ICD-10-CM

## 2018-02-02 DIAGNOSIS — R2681 Unsteadiness on feet: Secondary | ICD-10-CM | POA: Insufficient documentation

## 2018-02-02 NOTE — Therapy (Signed)
[facility] [address] [place], [address] Phone: [phone]   Fax:  [phone]  Physical Therapy Treatment  Patient Details  Name: Rebecca Wolf MRN: [id number] Date of Birth: [birth year] No data recorded  Encounter Date: 02/02/2018  PT End of Session - 02/02/18 1357    Visit Number  41    Number of Visits  51    Date for PT Re-Evaluation  03/17/18    Authorization Type  1/10    PT Start Time  1350    PT Stop Time  1430    PT Time Calculation (min)  40 min    Equipment Utilized During Treatment  Gait belt    Activity Tolerance  Patient tolerated treatment well    Behavior During Therapy  WFL for tasks assessed/performed       Past Medical History:  Diagnosis Date  . Asthma   . Breast cancer ([place]) 2003   LT LUMPECTOMY  . Breast cancer ([place]) 2004   LT LUMPECTOMY  . 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  . GERD (gastroesophageal reflux disease)   . Hemorrhoids   . Hypertension   . Personal history of chemotherapy 2004   BREAST CA  . Personal history of radiation therapy 2003   BREAST CA  . Personal history of radiation therapy 2004   BREAST CA  . Polyp of colon 2002   bleeding polyp of right ascending colon  . PUD (peptic ulcer disease)   . Seizures ([place])    epilepsy well controlled    Past Surgical History:  Procedure Laterality Date  . BREAST EXCISIONAL BIOPSY Left 2003   positive  . BREAST EXCISIONAL BIOPSY Left 2004   positive  . BREAST LUMPECTOMY Left 2003   BREAST CA  . BREAST LUMPECTOMY Left 2004   BREAST CA  . HEMICOLECTOMY Right    bowel obstruction  . HEMORRHOID SURGERY    . THORACOTOMY/LOBECTOMY  1999   carcinoid tumor  . TOTAL VAGINAL HYSTERECTOMY    . VARICOSE VEIN SURGERY      There were no vitals filed for this visit.  Subjective Assessment - 02/02/18 1356    Subjective  Pt reports no signficant updates since previous session. Pt feels that  injection in knees has helped and that her knees are feeling "ok" today.     Pertinent History  Previously seen for balance disorders and for neck pain over the past year. Pt reports R knee feels sore today but is not painful.    Limitations  Walking;Sitting    How long can you walk comfortably?  3 min     Diagnostic tests  None    Patient Stated Goals  To be able to walk without an walker.     Currently in Pain?  No/denies         TREATMENT Therapeutic Exercises Nu step level 4 x10 min to improve LE strength  Amb 222ft at beginning of session and 131ft at end of session to improve endurance and functional mobility Standing with no UE support as long as possible x4 (~40s, 30s, 30s, 15s) Sit to stands from rollator 2x10 with focus on incr speed  Seated ball squeeze hip adduction 3s hold 2x10 Seated hip abduction with RTB around distal thigh 2x10   Patient demonstrates increased fatigue at end of the treatment session.      PT Education - 02/02/18 1357    Education provided  Yes  Education Details  Patient educated on proper form and technique for therapeutic exercises.    Person(s) Educated  Patient    Methods  Explanation;Demonstration    Comprehension  Verbalized understanding;Returned demonstration          PT Long Term Goals - 01/20/18 1403      PT LONG TERM GOAL #1   Title  Patient will be independent with HEP to continue benefits of therapy after discharge.    Baseline  Requires moderate cueing with performance of exercise; 12/02/2017: moderate cueing to complete HEP; 01/20/2018: Performs HEP with ambulation    Time  8    Period  Weeks    Status  Achieved      PT LONG TERM GOAL #2   Title  Patient will improve TUG to under 12 seconds to indicate signficant improvement in fall risk.     Baseline  TUG: 24sec; 03/26/2017: TUG: 22sec; 05/12/17: 21.7 sec; 05/14/2017: 19sec; 08/26/2016: 14.75; 10/14/2017: 18sec; 12/02/2017: 16sec; 01/20/2018:    Time  8    Period  Weeks     Status  On-going      PT LONG TERM GOAL #3   Title  Patient will improve 11minWT to over 1053ft to indicate functional improvement in walking tolerance and greater ability to walk in stores before requiring a sitting rest break    Baseline  433ft; 03/26/2017: 584ft; 05/12/17: 463ft (primary deficit is DOE); 07/14/17: 475ft DOE; 08/26/2017: 361ft; 10/14/2017: 444ft ; 12/02/2017: 215ft; 01/20/2018: 454ft    Time  8    Period  Weeks    Status  On-going      PT LONG TERM GOAL #4   Title  Patient will demonstrate ability to stand for over 2 min to allow for improved ability to perform household chores at home.     Baseline  able to stand for 50sec before requiring a sitting rest break; 03/26/2017: 60 sec before requiring a break; 05/12/17: 60 seconds per pt subjective report; 07/14/17: 30sec 08/26/2017: 50sec; 10/14/2017: 60sec  12/02/2017: 45sec; 01/20/2018: 38sec    Time  8    Period  Weeks    Status  On-going      PT LONG TERM GOAL #5   Title  Pt will decrease 5TSTS to <12 seconds from a chair at 19in in height in order to demonstrate clinically significant improvement in LE strength    Baseline  5xSTS: 14 sec from 22" height chair; 03/26/2017: 12.5sec; 05/12/17: 13.4 sec; 07/14/17: Deffered; 08/26/2017: 21sec from 21" height; 10/14/2017: 19" height 19 sec; 12/02/2017: 15;  at a height of 23in; 01/20/2018: 13sec from 23in chair     Time  8    Period  Weeks    Status  On-going            Plan - 02/02/18 1437    Clinical Impression Statement  Patient demonstrates decreased tolerance to standing endurance indicated by ability to stand ~40s prior to requiring rest break followed by 30s and 15s with rest breaks. Pt has maintained ablity to amblate 239ft with hand held assist. Pt will continue to benefit from skilled PT in order to return to prior level of function.    Rehab Potential  Fair    Clinical Impairments Affecting Rehab Potential  Positive: motivation; Negative: age, chronicity, bilateral knee pain     PT Frequency  2x / week    PT Duration  8 weeks    PT Treatment/Interventions  ADLs/Self Care Home Management;Aquatic Therapy;Canalith Repostioning;Cryotherapy;Electrical Stimulation;Iontophoresis 4mg /ml  Dexamethasone;Moist Heat;Traction;Ultrasound;DME Instruction;Gait training;Stair training;Functional mobility training;Therapeutic activities;Therapeutic exercise;Balance training;Neuromuscular re-education;Patient/family education;Wheelchair mobility training;Manual techniques;Passive range of motion;Vestibular    PT Next Visit Plan  Progress strengthening, reinforce HEP    PT Home Exercise Plan  Cervical retraction    Consulted and Agree with Plan of Care  Patient;Family member/caregiver    Family Member Consulted  Daughter       Patient will benefit from skilled therapeutic intervention in order to improve the following deficits and impairments:  Abnormal gait, Decreased strength, Obesity, Decreased activity tolerance, Difficulty walking, Decreased balance, Decreased mobility, Decreased coordination, Increased muscle spasms, Postural dysfunction, Hypomobility, Decreased range of motion, Decreased endurance  Visit Diagnosis: Muscle weakness (generalized)  Unsteadiness on feet  Difficulty in walking, not elsewhere classified     Problem List Patient Active Problem List   Diagnosis Date Noted  . Heart palpitations 01/06/2018  . Cystocele, midline 10/30/2017  . Rectocele 10/30/2017  . Microcytic red blood cells 09/23/2017  . Localized edema 02/13/2017  . Hemorrhoids 05/17/2016  . Obesity (BMI 30-39.9) 04/26/2016  . Multiple lung nodules 04/19/2016  . Chest pain 04/17/2016  . Partial symptomatic epilepsy with complex partial seizures, not intractable, without status epilepticus ([place]) 06/30/2015  . Bronchiectasis without complication ([place]) 22/63/3354  . Imbalance 11/08/2014  . Essential (primary) hypertension 11/08/2014  . Personal history of malignant neoplasm of breast  11/08/2014  . History of malignant carcinoid tumor of bronchus and lung 11/08/2014  . H/O peptic ulcer 11/08/2014  . Asthma, mild intermittent 11/08/2014  . PE (pulmonary embolism) 11/08/2014  . Gonalgia 11/08/2014  . Leg varices 11/08/2014    Georg Ruddle, SPT 02/02/2018, 2:41 PM  [facility] [address] [place], [address] Phone: [phone]   Fax:  [phone]  Name: Rebecca Wolf MRN: [id number] Date of Birth: 13-Jan-1925

## 2018-02-04 ENCOUNTER — Ambulatory Visit: Payer: Medicare Other | Admitting: Gastroenterology

## 2018-02-04 NOTE — Telephone Encounter (Signed)
Called and left voicemail for patient to call back to be schedule for pessary placement. Unable to reach her Daughter [name] due to her phone number having restriction unable to leave message

## 2018-02-04 NOTE — Telephone Encounter (Signed)
Patient is schedule 02/13/18 with [facility]

## 2018-02-09 ENCOUNTER — Ambulatory Visit: Payer: Medicare Other

## 2018-02-09 DIAGNOSIS — M6281 Muscle weakness (generalized): Secondary | ICD-10-CM

## 2018-02-09 DIAGNOSIS — R262 Difficulty in walking, not elsewhere classified: Secondary | ICD-10-CM | POA: Diagnosis not present

## 2018-02-09 DIAGNOSIS — R2681 Unsteadiness on feet: Secondary | ICD-10-CM

## 2018-02-09 NOTE — Therapy (Cosign Needed)
[facility] [address], [place], [address] Phone: [phone]   Fax:  [phone]  Physical Therapy Treatment  Patient Details  Name: Rebecca Wolf MRN: [id number] Date of Birth: Jan 18, 1925 No data recorded  Encounter Date: 02/09/2018  PT End of Session - 02/09/18 1406    Visit Number  42    Number of Visits  51    Date for PT Re-Evaluation  03/17/18    Authorization Type  2/10    PT Start Time  1350    PT Stop Time  1430    PT Time Calculation (min)  40 min    Equipment Utilized During Treatment  Gait belt    Activity Tolerance  Patient tolerated treatment well    Behavior During Therapy  WFL for tasks assessed/performed       Past Medical History:  Diagnosis Date  . Asthma   . Breast cancer ([place]) 2003   LT LUMPECTOMY  . Breast cancer ([place]) 2004   LT LUMPECTOMY  . 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  . GERD (gastroesophageal reflux disease)   . Hemorrhoids   . Hypertension   . Personal history of chemotherapy 2004   BREAST CA  . Personal history of radiation therapy 2003   BREAST CA  . Personal history of radiation therapy 2004   BREAST CA  . Polyp of colon 2002   bleeding polyp of right ascending colon  . PUD (peptic ulcer disease)   . Seizures ([place])    epilepsy well controlled    Past Surgical History:  Procedure Laterality Date  . BREAST EXCISIONAL BIOPSY Left 2003   positive  . BREAST EXCISIONAL BIOPSY Left 2004   positive  . BREAST LUMPECTOMY Left 2003   BREAST CA  . BREAST LUMPECTOMY Left 2004   BREAST CA  . HEMICOLECTOMY Right    bowel obstruction  . HEMORRHOID SURGERY    . THORACOTOMY/LOBECTOMY  1999   carcinoid tumor  . TOTAL VAGINAL HYSTERECTOMY    . VARICOSE VEIN SURGERY      There were no vitals filed for this visit.  Subjective Assessment - 02/09/18 1402    Subjective  Pt reports that she is feeling "fair to [place]" and that she is glad that  she is able to still get along with her rollator. No other significant updates.     Pertinent History  Previously seen for balance disorders and for neck pain over the past year. Pt reports R knee feels sore today but is not painful.    Limitations  Walking;Sitting    How long can you walk comfortably?  3 min     Diagnostic tests  None    Patient Stated Goals  To be able to walk without an walker.     Currently in Pain?  No/denies       TREATMENT Therapeutic Exercises Nu step level 4 x10 min to improve LE strength and muscular endurance with constant monitoring of fatigue; Amb 29ft at beginning of session and 111ft at end of session with CGA and hand held assist to improve endurance and functional mobility Sit to stands from rollator 2x10 with focus on incr speed  Standing hip extension 2x10 B with BUE support Standing hip abduction on airex pad 2x10 B with BUE support  Patient demonstrates increased fatigue at end of the treatment session.       PT Education - 02/09/18 1405  Education provided  Yes    Education Details  Patient educated on proper form and technique for therapeutic exercises.    Person(s) Educated  Patient    Methods  Explanation;Demonstration    Comprehension  Verbalized understanding;Returned demonstration          PT Long Term Goals - 01/20/18 1403      PT LONG TERM GOAL #1   Title  Patient will be independent with HEP to continue benefits of therapy after discharge.    Baseline  Requires moderate cueing with performance of exercise; 12/02/2017: moderate cueing to complete HEP; 01/20/2018: Performs HEP with ambulation    Time  8    Period  Weeks    Status  Achieved      PT LONG TERM GOAL #2   Title  Patient will improve TUG to under 12 seconds to indicate signficant improvement in fall risk.     Baseline  TUG: 24sec; 03/26/2017: TUG: 22sec; 05/12/17: 21.7 sec; 05/14/2017: 19sec; 08/26/2016: 14.75; 10/14/2017: 18sec; 12/02/2017: 16sec; 01/20/2018:    Time   8    Period  Weeks    Status  On-going      PT LONG TERM GOAL #3   Title  Patient will improve 45minWT to over 1029ft to indicate functional improvement in walking tolerance and greater ability to walk in stores before requiring a sitting rest break    Baseline  437ft; 03/26/2017: 57ft; 05/12/17: 436ft (primary deficit is DOE); 07/14/17: 432ft DOE; 08/26/2017: 367ft; 10/14/2017: 475ft ; 12/02/2017: 23ft; 01/20/2018: 469ft    Time  8    Period  Weeks    Status  On-going      PT LONG TERM GOAL #4   Title  Patient will demonstrate ability to stand for over 2 min to allow for improved ability to perform household chores at home.     Baseline  able to stand for 50sec before requiring a sitting rest break; 03/26/2017: 60 sec before requiring a break; 05/12/17: 60 seconds per pt subjective report; 07/14/17: 30sec 08/26/2017: 50sec; 10/14/2017: 60sec  12/02/2017: 45sec; 01/20/2018: 38sec    Time  8    Period  Weeks    Status  On-going      PT LONG TERM GOAL #5   Title  Pt will decrease 5TSTS to <12 seconds from a chair at 19in in height in order to demonstrate clinically significant improvement in LE strength    Baseline  5xSTS: 14 sec from 22" height chair; 03/26/2017: 12.5sec; 05/12/17: 13.4 sec; 07/14/17: Deffered; 08/26/2017: 21sec from 21" height; 10/14/2017: 19" height 19 sec; 12/02/2017: 15;  at a height of 23in; 01/20/2018: 13sec from 23in chair     Time  8    Period  Weeks    Status  On-going            Plan - 02/09/18 1432    Clinical Impression Statement  Pt demonstrates maintenance of tolerance to ambulation with CGA and hand held assist from SPT. Pt continues to demonstrate decreased tolerance to standing indicated by need for UE support with standing therapeutic exercises and need for signficiant rest breaks. Pt will benefit from continued PT in order to return to prior level of function.     Rehab Potential  Fair    Clinical Impairments Affecting Rehab Potential  Positive: motivation;  Negative: age, chronicity, bilateral knee pain    PT Frequency  2x / week    PT Duration  8 weeks    PT Treatment/Interventions  ADLs/Self Care Home Management;Aquatic Therapy;Canalith Repostioning;Cryotherapy;Electrical Stimulation;Iontophoresis 4mg /ml Dexamethasone;Moist Heat;Traction;Ultrasound;DME Instruction;Gait training;Stair training;Functional mobility training;Therapeutic activities;Therapeutic exercise;Balance training;Neuromuscular re-education;Patient/family education;Wheelchair mobility training;Manual techniques;Passive range of motion;Vestibular    PT Next Visit Plan  Progress strengthening, reinforce HEP    PT Home Exercise Plan  Cervical retraction    Consulted and Agree with Plan of Care  Patient;Family member/caregiver       Patient will benefit from skilled therapeutic intervention in order to improve the following deficits and impairments:  Abnormal gait, Decreased strength, Obesity, Decreased activity tolerance, Difficulty walking, Decreased balance, Decreased mobility, Decreased coordination, Increased muscle spasms, Postural dysfunction, Hypomobility, Decreased range of motion, Decreased endurance  Visit Diagnosis: Muscle weakness (generalized)  Unsteadiness on feet  Difficulty in walking, not elsewhere classified     Problem List Patient Active Problem List   Diagnosis Date Noted  . Heart palpitations 01/06/2018  . Cystocele, midline 10/30/2017  . Rectocele 10/30/2017  . Microcytic red blood cells 09/23/2017  . Localized edema 02/13/2017  . Hemorrhoids 05/17/2016  . Obesity (BMI 30-39.9) 04/26/2016  . Multiple lung nodules 04/19/2016  . Chest pain 04/17/2016  . Partial symptomatic epilepsy with complex partial seizures, not intractable, without status epilepticus ([place]) 06/30/2015  . Bronchiectasis without complication ([place]) 17/71/1657  . Imbalance 11/08/2014  . Essential (primary) hypertension 11/08/2014  . Personal history of malignant neoplasm of  breast 11/08/2014  . History of malignant carcinoid tumor of bronchus and lung 11/08/2014  . H/O peptic ulcer 11/08/2014  . Asthma, mild intermittent 11/08/2014  . PE (pulmonary embolism) 11/08/2014  . Gonalgia 11/08/2014  . Leg varices 11/08/2014   This entire session was performed under direct supervision and direction of a licensed therapist/therapist assistant . I have personally read, edited and approve of the note as written.   Georg Ruddle SPT Lyndel Safe Huprich PT, DPT, GCS  Huprich,Jason 02/10/2018, 8:36 PM  [facility] [address] [place], [address] Phone: [phone]   Fax:  [phone]  Name: JESYCA WEISENBURGER MRN: [id number] Date of Birth: Apr 30, 1925

## 2018-02-10 ENCOUNTER — Encounter: Payer: Self-pay | Admitting: Podiatry

## 2018-02-10 ENCOUNTER — Ambulatory Visit (INDEPENDENT_AMBULATORY_CARE_PROVIDER_SITE_OTHER): Payer: Medicare Other | Admitting: Podiatry

## 2018-02-10 DIAGNOSIS — B351 Tinea unguium: Secondary | ICD-10-CM | POA: Diagnosis not present

## 2018-02-10 DIAGNOSIS — M79676 Pain in unspecified toe(s): Secondary | ICD-10-CM | POA: Diagnosis not present

## 2018-02-12 DIAGNOSIS — J479 Bronchiectasis, uncomplicated: Secondary | ICD-10-CM | POA: Diagnosis not present

## 2018-02-12 DIAGNOSIS — R0609 Other forms of dyspnea: Secondary | ICD-10-CM | POA: Diagnosis not present

## 2018-02-12 DIAGNOSIS — R0602 Shortness of breath: Secondary | ICD-10-CM | POA: Diagnosis not present

## 2018-02-12 DIAGNOSIS — J4520 Mild intermittent asthma, uncomplicated: Secondary | ICD-10-CM | POA: Diagnosis not present

## 2018-02-12 NOTE — Progress Notes (Signed)
   SUBJECTIVE Patient presents to office today complaining of elongated, thickened nails that cause pain while ambulating in shoes. She is unable to trim her own nails. Patient is here for further evaluation and treatment.  Past Medical History:  Diagnosis Date  . Asthma   . Breast cancer ([facility]) 2003   LT LUMPECTOMY  . Breast cancer ([facility]) 2004   LT LUMPECTOMY  . 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  . GERD (gastroesophageal reflux disease)   . Hemorrhoids   . Hypertension   . Personal history of chemotherapy 2004   BREAST CA  . Personal history of radiation therapy 2003   BREAST CA  . Personal history of radiation therapy 2004   BREAST CA  . Polyp of colon 2002   bleeding polyp of right ascending colon  . PUD (peptic ulcer disease)   . Seizures ([facility])    epilepsy well controlled    OBJECTIVE General Patient is awake, alert, and oriented x 3 and in no acute distress. Derm Skin is dry and supple bilateral. Negative open lesions or macerations. Remaining integument unremarkable. Nails are tender, long, thickened and dystrophic with subungual debris, consistent with onychomycosis, 1-5 bilateral. No signs of infection noted. Vasc  DP and PT pedal pulses palpable bilaterally. Temperature gradient within normal limits.  Neuro Epicritic and protective threshold sensation grossly intact bilaterally.  Musculoskeletal Exam No symptomatic pedal deformities noted bilateral. Muscular strength within normal limits.  ASSESSMENT 1. Onychodystrophic nails 1-5 bilateral with hyperkeratosis of nails.  2. Onychomycosis of nail due to dermatophyte bilateral 3. Pain in foot bilateral  PLAN OF CARE 1. Patient evaluated today.  2. Instructed to maintain good pedal hygiene and foot care.  3. Mechanical debridement of nails 1-5 bilaterally performed using a nail nipper. Filed with dremel without incident.  4. Return to clinic in 3 mos.    Brent M. Evans, DPM [facility]  Dr. Brent M. Evans, DPM    [address]                                        [place], [place] [address]                Office [phone]  Fax [phone]     

## 2018-02-13 ENCOUNTER — Telehealth: Payer: Self-pay | Admitting: Obstetrics & Gynecology

## 2018-02-13 ENCOUNTER — Other Ambulatory Visit: Payer: Self-pay | Admitting: Obstetrics & Gynecology

## 2018-02-13 ENCOUNTER — Ambulatory Visit (INDEPENDENT_AMBULATORY_CARE_PROVIDER_SITE_OTHER): Payer: Medicare Other | Admitting: Obstetrics & Gynecology

## 2018-02-13 ENCOUNTER — Encounter: Payer: Self-pay | Admitting: Obstetrics & Gynecology

## 2018-02-13 VITALS — BP 110/70 | Ht 64.0 in | Wt 175.0 lb

## 2018-02-13 DIAGNOSIS — N8110 Cystocele, unspecified: Secondary | ICD-10-CM | POA: Diagnosis not present

## 2018-02-13 DIAGNOSIS — R002 Palpitations: Secondary | ICD-10-CM | POA: Diagnosis not present

## 2018-02-13 DIAGNOSIS — N816 Rectocele: Secondary | ICD-10-CM | POA: Diagnosis not present

## 2018-02-13 DIAGNOSIS — R000 Tachycardia, unspecified: Secondary | ICD-10-CM | POA: Diagnosis not present

## 2018-02-13 DIAGNOSIS — R0602 Shortness of breath: Secondary | ICD-10-CM | POA: Diagnosis not present

## 2018-02-13 NOTE — Progress Notes (Signed)
  HPI:      Ms. Rebecca Wolf is a 84 y.o. [id number] who presents today for her pessary placement and examination related to her pelvic floor weakening.  She had expulsion of ring pessary so presents today for placement of gellhorn type pessary.  Patient complains of a cystocele and rectocele. Problem started a few years ago. Symptoms include: prolapse of tissue with straining and impaired defecation: mild. Symptoms have gradually worsened. Mostly vaginal pressure and rectal type sx's.  Occas [name].  PMHx: She  has a past medical history of Asthma, Breast cancer ([place]) (2003), Breast cancer ([place]) (2004), Carcinoid tumor determined by biopsy of lung (2001), GERD (gastroesophageal reflux disease), Hemorrhoids, Hypertension, Personal history of chemotherapy (2004), Personal history of radiation therapy (2003), Personal history of radiation therapy (2004), Polyp of colon (2002), PUD (peptic ulcer disease), and Seizures ([place]). Also,  has a past surgical history that includes Thoracotomy/lobectomy (1999); Hemicolectomy (Right); Total vaginal hysterectomy; Varicose vein surgery; Hemorrhoid surgery; Breast excisional biopsy (Left, 2003); Breast excisional biopsy (Left, 2004); Breast lumpectomy (Left, 2003); and Breast lumpectomy (Left, 2004)., family history includes Alcoholism in her father; Diabetes in her sister.,  reports that she has quit smoking. She has never used smokeless tobacco. She reports that she does not drink alcohol or use drugs.  She has a current medication list which includes the following prescription(s): acetaminophen, albuterol, amlodipine, ampicillin, cefuroxime, diltiazem, eliquis, fluticasone, fluticasone-salmeterol, fluticasone-salmeterol, levetiracetam, montelukast, olopatadine hcl, pantoprazole, prednisolone acetate, spironolactone-hydrochlorothiazide, and sucralfate. Also, is allergic to levofloxacin and sulfa antibiotics.  Review of Systems  All other systems reviewed and are  negative.  Objective: BP 110/70   Ht 5\' 4"  (1.626 m)   Wt 175 lb (79.4 kg)   BMI 30.04 kg/m  Physical Exam  Constitutional: She is oriented to person, place, and time. She appears well-developed and well-nourished. No distress.  Genitourinary: Vagina normal. Pelvic exam was performed with patient supine. There is no rash, tenderness or lesion on the right labia. There is no rash, tenderness or lesion on the left labia. No erythema or bleeding in the vagina.  Genitourinary Comments: Cuff intact/ no lesions Atrophy present; Gr 2 rectocele and Gr 1 cystocele Absent uterus and cervix  HENT:  Head: Normocephalic and atraumatic.  Nose: Nose normal.  Mouth/Throat: Oropharynx is clear and moist.  Abdominal: Soft. She exhibits no distension. There is no tenderness.  Musculoskeletal: Normal range of motion.  Neurological: She is alert and oriented to person, place, and time. No cranial nerve deficit.  Skin: Skin is warm and dry.  Psychiatric: She has a normal mood and affect.   A/P:1. Cystocele with rectocele Pessary was placed today.  Gellhorn #2.  Tender exam to place. Instructions given for care. Concerning symptoms to observe for are counseled to patient. Follow up scheduled for 1 months.  A total of 15 minutes were spent face-to-face with the patient during this encounter and over half of that time dealt with counseling and coordination of care.  Barnett Applebaum, MD, [facility], [facility] 02/13/2018  11:45 AM

## 2018-02-13 NOTE — Patient Instructions (Signed)

## 2018-02-13 NOTE — Progress Notes (Signed)
Pessary expulsion at home, per patient phone call Declines wanting another attempt Barnett Applebaum, MD, [facility], [facility] 02/13/2018  5:06 PM

## 2018-02-13 NOTE — Telephone Encounter (Signed)
Patient had pessary placed today, 8/16, which fell out when patient went to restroom after she got home.  Offered to reschedule Monday but patient did not want to schedule, just wanted to pass message to [facility].   cb [phone]

## 2018-02-16 ENCOUNTER — Ambulatory Visit: Payer: Medicare Other

## 2018-02-17 NOTE — Telephone Encounter (Signed)
Patient's daughter is calling to follow up on what needs to be done next. Please advise

## 2018-02-20 DIAGNOSIS — R159 Full incontinence of feces: Secondary | ICD-10-CM | POA: Diagnosis not present

## 2018-02-20 DIAGNOSIS — R198 Other specified symptoms and signs involving the digestive system and abdomen: Secondary | ICD-10-CM | POA: Diagnosis not present

## 2018-02-20 DIAGNOSIS — R1084 Generalized abdominal pain: Secondary | ICD-10-CM | POA: Diagnosis not present

## 2018-02-23 ENCOUNTER — Ambulatory Visit: Payer: Medicare Other

## 2018-02-26 ENCOUNTER — Other Ambulatory Visit: Payer: Self-pay | Admitting: Internal Medicine

## 2018-02-26 ENCOUNTER — Ambulatory Visit: Payer: Medicare Other

## 2018-02-26 DIAGNOSIS — R2681 Unsteadiness on feet: Secondary | ICD-10-CM | POA: Diagnosis not present

## 2018-02-26 DIAGNOSIS — M6281 Muscle weakness (generalized): Secondary | ICD-10-CM

## 2018-02-26 DIAGNOSIS — R262 Difficulty in walking, not elsewhere classified: Secondary | ICD-10-CM

## 2018-02-26 NOTE — Therapy (Signed)
[facility] [address], [place], [address] Phone: [phone]   Fax:  [phone]  Physical Therapy Treatment  Patient Details  Name: Rebecca Wolf MRN: [id number] Date of Birth: June 19, 1925 No data recorded  Encounter Date: 02/26/2018  PT End of Session - 02/26/18 1425    Visit Number  43    Number of Visits  51    Date for PT Re-Evaluation  03/17/18    Authorization Type  3/10    PT Start Time  1355    PT Stop Time  1430    PT Time Calculation (min)  35 min    Equipment Utilized During Treatment  Gait belt    Activity Tolerance  Patient tolerated treatment well    Behavior During Therapy  WFL for tasks assessed/performed       Past Medical History:  Diagnosis Date  . Asthma   . Breast cancer ([place]) 2003   LT LUMPECTOMY  . Breast cancer ([place]) 2004   LT LUMPECTOMY  . 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  . GERD (gastroesophageal reflux disease)   . Hemorrhoids   . Hypertension   . Personal history of chemotherapy 2004   BREAST CA  . Personal history of radiation therapy 2003   BREAST CA  . Personal history of radiation therapy 2004   BREAST CA  . Polyp of colon 2002   bleeding polyp of right ascending colon  . PUD (peptic ulcer disease)   . Seizures ([place])    epilepsy well controlled    Past Surgical History:  Procedure Laterality Date  . BREAST EXCISIONAL BIOPSY Left 2003   positive  . BREAST EXCISIONAL BIOPSY Left 2004   positive  . BREAST LUMPECTOMY Left 2003   BREAST CA  . BREAST LUMPECTOMY Left 2004   BREAST CA  . HEMICOLECTOMY Right    bowel obstruction  . HEMORRHOID SURGERY    . THORACOTOMY/LOBECTOMY  1999   carcinoid tumor  . TOTAL VAGINAL HYSTERECTOMY    . VARICOSE VEIN SURGERY      There were no vitals filed for this visit.  Subjective Assessment - 02/26/18 1358    Subjective  Patient reports increased emotion grief and sadness with her husband being  into a hospice program. Patient reports it been hard to stay motivated with all the driving from the physical fatigue.    Pertinent History  Previously seen for balance disorders and for neck pain over the past year. Pt reports R knee feels sore today but is not painful.    Limitations  Walking;Sitting    How long can you walk comfortably?  3 min     Diagnostic tests  None    Patient Stated Goals  To be able to walk without an walker.     Currently in Pain?  No/denies          TREATMENT Therapeutic Exercises Hip abduction with RTB in sitting - x 25 Nu step level 4 x 7 min to improve LE strength and muscular endurance with constant monitoring of fatigue Hip adduction with ball in sitting - x 30 Amb 132ft at end of session with CGA and hand held assist to improve endurance and functional mobility Sitting knee extension with RTB - x 20 B   Patient demonstrates increased fatigue at end of the treatment session.       PT Education - 02/26/18 1425    Education provided  Yes  Education Details  form/technique with exercise    Person(s) Educated  Patient    Methods  Explanation;Demonstration    Comprehension  Verbalized understanding;Returned demonstration          PT Long Term Goals - 01/20/18 1403      PT LONG TERM GOAL #1   Title  Patient will be independent with HEP to continue benefits of therapy after discharge.    Baseline  Requires moderate cueing with performance of exercise; 12/02/2017: moderate cueing to complete HEP; 01/20/2018: Performs HEP with ambulation    Time  8    Period  Weeks    Status  Achieved      PT LONG TERM GOAL #2   Title  Patient will improve TUG to under 12 seconds to indicate signficant improvement in fall risk.     Baseline  TUG: 24sec; 03/26/2017: TUG: 22sec; 05/12/17: 21.7 sec; 05/14/2017: 19sec; 08/26/2016: 14.75; 10/14/2017: 18sec; 12/02/2017: 16sec; 01/20/2018:    Time  8    Period  Weeks    Status  On-going      PT LONG TERM GOAL #3   Title   Patient will improve 35minWT to over 1045ft to indicate functional improvement in walking tolerance and greater ability to walk in stores before requiring a sitting rest break    Baseline  439ft; 03/26/2017: 562ft; 05/12/17: 472ft (primary deficit is DOE); 07/14/17: 463ft DOE; 08/26/2017: 336ft; 10/14/2017: 459ft ; 12/02/2017: 265ft; 01/20/2018: 41ft    Time  8    Period  Weeks    Status  On-going      PT LONG TERM GOAL #4   Title  Patient will demonstrate ability to stand for over 2 min to allow for improved ability to perform household chores at home.     Baseline  able to stand for 50sec before requiring a sitting rest break; 03/26/2017: 60 sec before requiring a break; 05/12/17: 60 seconds per pt subjective report; 07/14/17: 30sec 08/26/2017: 50sec; 10/14/2017: 60sec  12/02/2017: 45sec; 01/20/2018: 38sec    Time  8    Period  Weeks    Status  On-going      PT LONG TERM GOAL #5   Title  Pt will decrease 5TSTS to <12 seconds from a chair at 19in in height in order to demonstrate clinically significant improvement in LE strength    Baseline  5xSTS: 14 sec from 22" height chair; 03/26/2017: 12.5sec; 05/12/17: 13.4 sec; 07/14/17: Deffered; 08/26/2017: 21sec from 21" height; 10/14/2017: 19" height 19 sec; 12/02/2017: 15;  at a height of 23in; 01/20/2018: 13sec from 23in chair     Time  8    Period  Weeks    Status  On-going            Plan - 02/26/18 1426    Clinical Impression Statement  Performed mostly sitting exercises today secondary to increased emotional and physical fatigue. Patient continues to demonstrate difficulty with ambulation and performing exercises in standing; however, is able to ambulate with hand held support from the PT indicating continued ambulatory function decreasing risk of being home bound. Patient will benefit from further skilled therapy to return to prior level of function.     Rehab Potential  Fair    Clinical Impairments Affecting Rehab Potential  Positive: motivation;  Negative: age, chronicity, bilateral knee pain    PT Frequency  2x / week    PT Duration  8 weeks    PT Treatment/Interventions  ADLs/Self Care Home Management;Aquatic Therapy;Canalith Repostioning;Cryotherapy;Electrical Stimulation;Iontophoresis 4mg /ml  Dexamethasone;Moist Heat;Traction;Ultrasound;DME Instruction;Gait training;Stair training;Functional mobility training;Therapeutic activities;Therapeutic exercise;Balance training;Neuromuscular re-education;Patient/family education;Wheelchair mobility training;Manual techniques;Passive range of motion;Vestibular    PT Next Visit Plan  Progress strengthening, reinforce HEP    PT Home Exercise Plan  Cervical retraction    Consulted and Agree with Plan of Care  Patient;Family member/caregiver       Patient will benefit from skilled therapeutic intervention in order to improve the following deficits and impairments:  Abnormal gait, Decreased strength, Obesity, Decreased activity tolerance, Difficulty walking, Decreased balance, Decreased mobility, Decreased coordination, Increased muscle spasms, Postural dysfunction, Hypomobility, Decreased range of motion, Decreased endurance  Visit Diagnosis: Muscle weakness (generalized)  Unsteadiness on feet  Difficulty in walking, not elsewhere classified     Problem List Patient Active Problem List   Diagnosis Date Noted  . Heart palpitations 01/06/2018  . Cystocele with rectocele 10/30/2017  . Rectocele 10/30/2017  . Microcytic red blood cells 09/23/2017  . Localized edema 02/13/2017  . Hemorrhoids 05/17/2016  . Obesity (BMI 30-39.9) 04/26/2016  . Multiple lung nodules 04/19/2016  . Chest pain 04/17/2016  . Partial symptomatic epilepsy with complex partial seizures, not intractable, without status epilepticus ([place]) 06/30/2015  . Bronchiectasis without complication ([place]) 18/98/4210  . Imbalance 11/08/2014  . Essential (primary) hypertension 11/08/2014  . Personal history of malignant neoplasm  of breast 11/08/2014  . History of malignant carcinoid tumor of bronchus and lung 11/08/2014  . H/O peptic ulcer 11/08/2014  . Asthma, mild intermittent 11/08/2014  . PE (pulmonary embolism) 11/08/2014  . Gonalgia 11/08/2014  . Leg varices 11/08/2014    Blythe Stanford, PT DPT 02/26/2018, 2:35 PM  [facility] [address] [place], [address] Phone: [phone]   Fax:  [phone]  Name: KOLBI TOFTE MRN: [id number] Date of Birth: 1924-11-12

## 2018-02-27 DIAGNOSIS — K760 Fatty (change of) liver, not elsewhere classified: Secondary | ICD-10-CM | POA: Diagnosis not present

## 2018-02-27 DIAGNOSIS — N1330 Unspecified hydronephrosis: Secondary | ICD-10-CM | POA: Diagnosis not present

## 2018-03-01 ENCOUNTER — Other Ambulatory Visit: Payer: Self-pay | Admitting: Internal Medicine

## 2018-03-03 ENCOUNTER — Ambulatory Visit: Payer: Medicare Other | Attending: Physical Medicine and Rehabilitation

## 2018-03-03 DIAGNOSIS — R2681 Unsteadiness on feet: Secondary | ICD-10-CM | POA: Insufficient documentation

## 2018-03-03 DIAGNOSIS — M6281 Muscle weakness (generalized): Secondary | ICD-10-CM | POA: Insufficient documentation

## 2018-03-03 DIAGNOSIS — R262 Difficulty in walking, not elsewhere classified: Secondary | ICD-10-CM | POA: Insufficient documentation

## 2018-03-16 ENCOUNTER — Ambulatory Visit: Payer: Medicare Other | Admitting: Obstetrics & Gynecology

## 2018-03-16 DIAGNOSIS — L570 Actinic keratosis: Secondary | ICD-10-CM | POA: Diagnosis not present

## 2018-03-16 DIAGNOSIS — L299 Pruritus, unspecified: Secondary | ICD-10-CM | POA: Diagnosis not present

## 2018-03-16 DIAGNOSIS — L853 Xerosis cutis: Secondary | ICD-10-CM | POA: Diagnosis not present

## 2018-03-17 ENCOUNTER — Ambulatory Visit: Payer: Medicare Other

## 2018-03-17 DIAGNOSIS — M6281 Muscle weakness (generalized): Secondary | ICD-10-CM | POA: Diagnosis not present

## 2018-03-17 DIAGNOSIS — R262 Difficulty in walking, not elsewhere classified: Secondary | ICD-10-CM

## 2018-03-17 DIAGNOSIS — R2681 Unsteadiness on feet: Secondary | ICD-10-CM | POA: Diagnosis not present

## 2018-03-17 NOTE — Therapy (Signed)
[facility] [address] [place], [address] Phone: [phone]   Fax:  [phone]  Physical Therapy Treatment  Patient Details  Name: Rebecca Wolf MRN: [id number] Date of Birth: September 27, 1924 No data recorded  Encounter Date: 03/17/2018  PT End of Session - 03/17/18 1510    Visit Number  44    Number of Visits  51    Date for PT Re-Evaluation  03/17/18    Authorization Type  4/10    PT Start Time  1430    PT Stop Time  1500    PT Time Calculation (min)  30 min    Equipment Utilized During Treatment  Gait belt    Activity Tolerance  Patient tolerated treatment well    Behavior During Therapy  WFL for tasks assessed/performed       Past Medical History:  Diagnosis Date  . Asthma   . Breast cancer ([place]) 2003   LT LUMPECTOMY  . Breast cancer ([place]) 2004   LT LUMPECTOMY  . 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  . GERD (gastroesophageal reflux disease)   . Hemorrhoids   . Hypertension   . Personal history of chemotherapy 2004   BREAST CA  . Personal history of radiation therapy 2003   BREAST CA  . Personal history of radiation therapy 2004   BREAST CA  . Polyp of colon 2002   bleeding polyp of right ascending colon  . PUD (peptic ulcer disease)   . Seizures ([place])    epilepsy well controlled    Past Surgical History:  Procedure Laterality Date  . BREAST EXCISIONAL BIOPSY Left 2003   positive  . BREAST EXCISIONAL BIOPSY Left 2004   positive  . BREAST LUMPECTOMY Left 2003   BREAST CA  . BREAST LUMPECTOMY Left 2004   BREAST CA  . HEMICOLECTOMY Right    bowel obstruction  . HEMORRHOID SURGERY    . THORACOTOMY/LOBECTOMY  1999   carcinoid tumor  . TOTAL VAGINAL HYSTERECTOMY    . VARICOSE VEIN SURGERY      There were no vitals filed for this visit.  Subjective Assessment - 03/17/18 1505    Subjective  Patient states she had to walk a lot over the past 2 weeks with her husband  being n hospice care and going to visit him. Patient reports she has been stressed and sad during this grieving process.     Pertinent History  Previously seen for balance disorders and for neck pain over the past year. Pt reports R knee feels sore today but is not painful.    Limitations  Walking;Sitting    How long can you walk comfortably?  3 min     Diagnostic tests  None    Patient Stated Goals  To be able to walk without an walker.     Currently in Pain?  No/denies       TREATMENT Therapeutic Exercise Ambulation 18ft with B UE to perform HHA with therapist Nustep with UE with  Both LE and UE to improve strengthening -- 10 min, x 4 level LAQ with RTB in sitting -- 2 x 8 B Hip abduction in standing -- x 20 with RTB around feet    Patient reports increased fatigue at the end of the session   PT Education - 03/17/18 1509    Education provided  Yes    Education Details  form/technique with exercise    Person(s) Educated  Patient    Methods  Explanation;Demonstration    Comprehension  Verbalized understanding;Returned demonstration          PT Long Term Goals - 01/20/18 1403      PT LONG TERM GOAL #1   Title  Patient will be independent with HEP to continue benefits of therapy after discharge.    Baseline  Requires moderate cueing with performance of exercise; 12/02/2017: moderate cueing to complete HEP; 01/20/2018: Performs HEP with ambulation    Time  8    Period  Weeks    Status  Achieved      PT LONG TERM GOAL #2   Title  Patient will improve TUG to under 12 seconds to indicate signficant improvement in fall risk.     Baseline  TUG: 24sec; 03/26/2017: TUG: 22sec; 05/12/17: 21.7 sec; 05/14/2017: 19sec; 08/26/2016: 14.75; 10/14/2017: 18sec; 12/02/2017: 16sec; 01/20/2018:    Time  8    Period  Weeks    Status  On-going      PT LONG TERM GOAL #3   Title  Patient will improve 29minWT to over 1035ft to indicate functional improvement in walking tolerance and greater ability to  walk in stores before requiring a sitting rest break    Baseline  458ft; 03/26/2017: 591ft; 05/12/17: 452ft (primary deficit is DOE); 07/14/17: 450ft DOE; 08/26/2017: 325ft; 10/14/2017: 437ft ; 12/02/2017: 21ft; 01/20/2018: 442ft    Time  8    Period  Weeks    Status  On-going      PT LONG TERM GOAL #4   Title  Patient will demonstrate ability to stand for over 2 min to allow for improved ability to perform household chores at home.     Baseline  able to stand for 50sec before requiring a sitting rest break; 03/26/2017: 60 sec before requiring a break; 05/12/17: 60 seconds per pt subjective report; 07/14/17: 30sec 08/26/2017: 50sec; 10/14/2017: 60sec  12/02/2017: 45sec; 01/20/2018: 38sec    Time  8    Period  Weeks    Status  On-going      PT LONG TERM GOAL #5   Title  Pt will decrease 5TSTS to <12 seconds from a chair at 19in in height in order to demonstrate clinically significant improvement in LE strength    Baseline  5xSTS: 14 sec from 22" height chair; 03/26/2017: 12.5sec; 05/12/17: 13.4 sec; 07/14/17: Deffered; 08/26/2017: 21sec from 21" height; 10/14/2017: 19" height 19 sec; 12/02/2017: 15;  at a height of 23in; 01/20/2018: 13sec from 23in chair     Time  8    Period  Weeks    Status  On-going            Plan - 03/17/18 1513    Clinical Impression Statement  Patient demonstrates no increase in pain during the session but secondary to increased self reported fatigued. She required UE support to perform ambulation and unable to perform excerises secondary to strength limitations. Patient will benefit from further skilled therapy to return to prior level of function.     Rehab Potential  Fair    Clinical Impairments Affecting Rehab Potential  Positive: motivation; Negative: age, chronicity, bilateral knee pain    PT Frequency  2x / week    PT Duration  8 weeks    PT Treatment/Interventions  ADLs/Self Care Home Management;Aquatic Therapy;Canalith Repostioning;Cryotherapy;Electrical  Stimulation;Iontophoresis 4mg /ml Dexamethasone;Moist Heat;Traction;Ultrasound;DME Instruction;Gait training;Stair training;Functional mobility training;Therapeutic activities;Therapeutic exercise;Balance training;Neuromuscular re-education;Patient/family education;Wheelchair mobility training;Manual techniques;Passive range of motion;Vestibular    PT Next Visit Plan  Progress strengthening, reinforce HEP    PT Home Exercise Plan  Cervical retraction    Consulted and Agree with Plan of Care  Patient;Family member/caregiver       Patient will benefit from skilled therapeutic intervention in order to improve the following deficits and impairments:  Abnormal gait, Decreased strength, Obesity, Decreased activity tolerance, Difficulty walking, Decreased balance, Decreased mobility, Decreased coordination, Increased muscle spasms, Postural dysfunction, Hypomobility, Decreased range of motion, Decreased endurance  Visit Diagnosis: Muscle weakness (generalized)  Unsteadiness on feet  Difficulty in walking, not elsewhere classified     Problem List Patient Active Problem List   Diagnosis Date Noted  . Heart palpitations 01/06/2018  . Cystocele with rectocele 10/30/2017  . Rectocele 10/30/2017  . Microcytic red blood cells 09/23/2017  . Localized edema 02/13/2017  . Hemorrhoids 05/17/2016  . Obesity (BMI 30-39.9) 04/26/2016  . Multiple lung nodules 04/19/2016  . Chest pain 04/17/2016  . Partial symptomatic epilepsy with complex partial seizures, not intractable, without status epilepticus ([place]) 06/30/2015  . Bronchiectasis without complication ([place]) 03/55/9741  . Imbalance 11/08/2014  . Essential (primary) hypertension 11/08/2014  . Personal history of malignant neoplasm of breast 11/08/2014  . History of malignant carcinoid tumor of bronchus and lung 11/08/2014  . H/O peptic ulcer 11/08/2014  . Asthma, mild intermittent 11/08/2014  . PE (pulmonary embolism) 11/08/2014  . Gonalgia  11/08/2014  . Leg varices 11/08/2014    Blythe Stanford, PT DPT 03/17/2018, 3:17 PM  [facility] [address] [place], [address] Phone: [phone]   Fax:  [phone]  Name: SAUMYA HUKILL MRN: [id number] Date of Birth: July 13, 1924

## 2018-03-20 DIAGNOSIS — M6281 Muscle weakness (generalized): Secondary | ICD-10-CM | POA: Diagnosis not present

## 2018-03-22 IMAGING — CR DG CHEST 2V
1 series · 2 of 2 positions shown · non-contrast
Comparison: April 17, 2016

CLINICAL DATA: Cough and chills.  History of breast carcinoma

EXAM:
CHEST  2 VIEW

[Series 1: dg chest 2 view · 0.14mm/px · 2 of 2 slices shown]
[im 1/2]
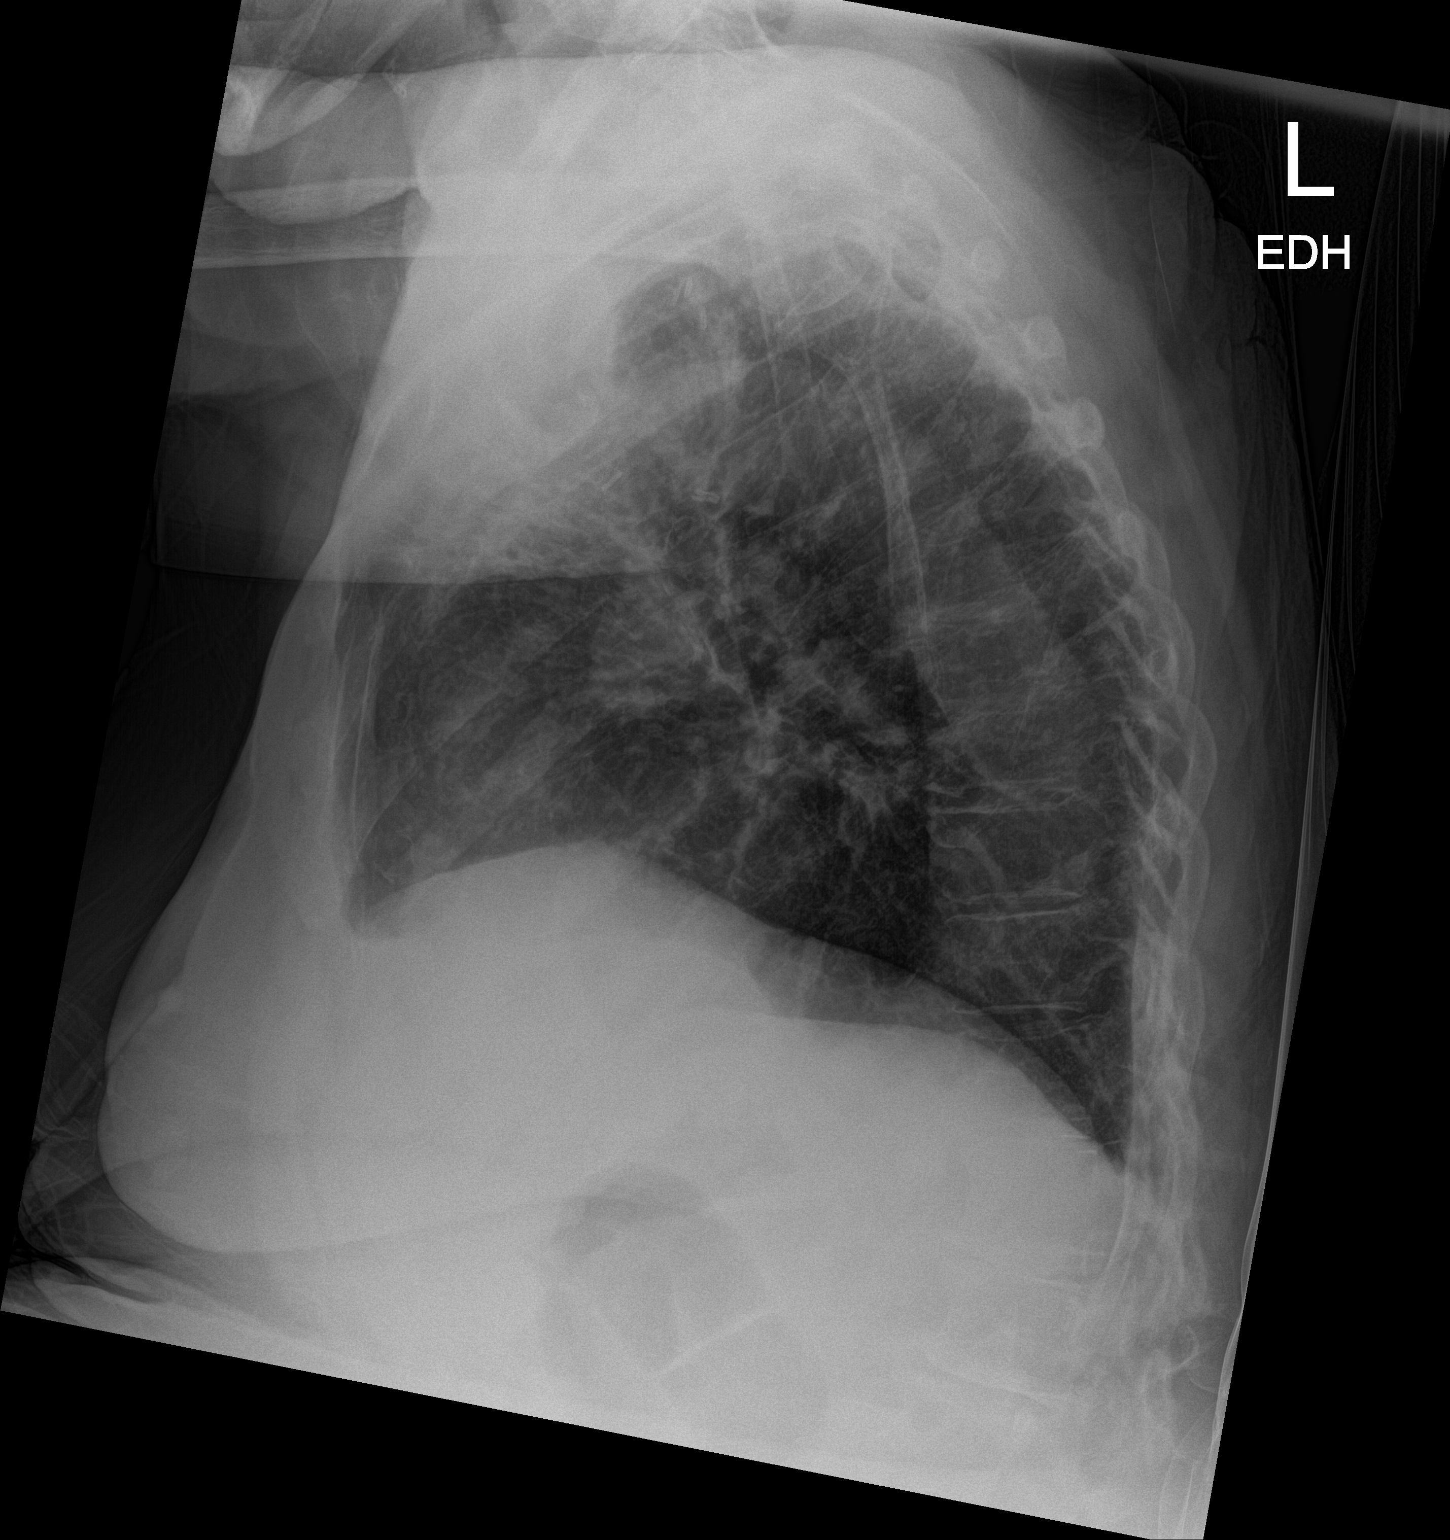
[im 2/2]
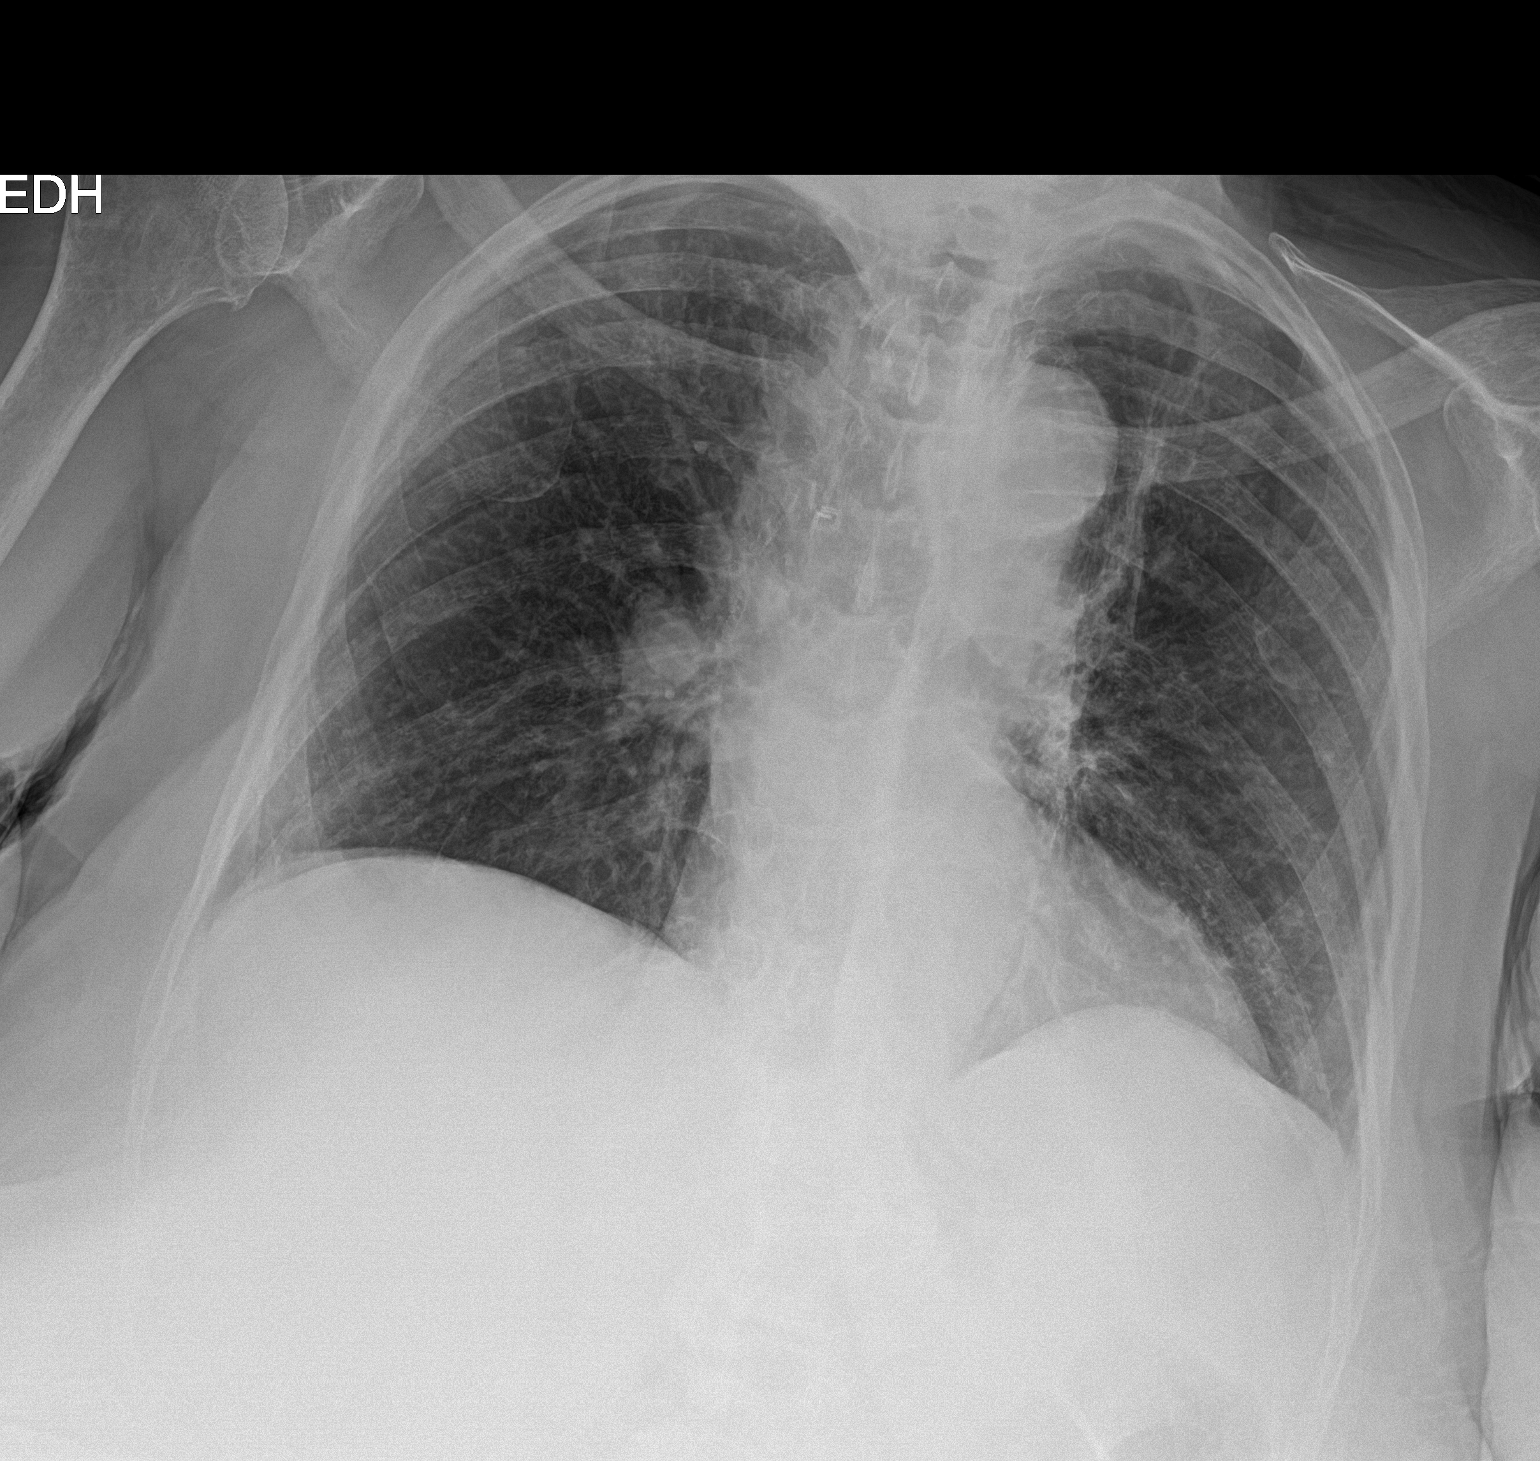

[2 of 2 positions shown; findings below may reference images not displayed]

FINDINGS: Scarring in the medial left upper lobe is stable. Postoperative
change in the right perihilar and right paratracheal regions is
stable. There is no edema or consolidation. Heart size and pulmonary
vascularity are normal. There is atherosclerotic calcification in
the aorta. There is no evident adenopathy. No blastic or lytic bone
lesions.
IMPRESSION: Postoperative change on the right. Scarring left upper lobe. No
edema or consolidation. Stable cardiac silhouette. There is aortic
atherosclerosis.

## 2018-03-24 ENCOUNTER — Ambulatory Visit: Payer: Medicare Other

## 2018-03-24 DIAGNOSIS — R2681 Unsteadiness on feet: Secondary | ICD-10-CM

## 2018-03-24 DIAGNOSIS — M6281 Muscle weakness (generalized): Secondary | ICD-10-CM | POA: Diagnosis not present

## 2018-03-24 DIAGNOSIS — R262 Difficulty in walking, not elsewhere classified: Secondary | ICD-10-CM

## 2018-03-24 NOTE — Therapy (Signed)
[facility] [address] [place], [address] Phone: [phone]   Fax:  [phone]  Physical Therapy Treatment  Patient Details  Name: Rebecca Wolf MRN: [id number] Date of Birth: Aug 02, 1924 No data recorded  Encounter Date: 03/24/2018  PT End of Session - 03/24/18 1456    Visit Number  45    Number of Visits  51    Date for PT Re-Evaluation  05/05/18    Authorization Type  5/10    PT Start Time  1345    PT Stop Time  1430    PT Time Calculation (min)  45 min    Equipment Utilized During Treatment  Gait belt    Activity Tolerance  Patient tolerated treatment well    Behavior During Therapy  WFL for tasks assessed/performed       Past Medical History:  Diagnosis Date  . Asthma   . Breast cancer ([place]) 2003   LT LUMPECTOMY  . Breast cancer ([place]) 2004   LT LUMPECTOMY  . 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  . GERD (gastroesophageal reflux disease)   . Hemorrhoids   . Hypertension   . Personal history of chemotherapy 2004   BREAST CA  . Personal history of radiation therapy 2003   BREAST CA  . Personal history of radiation therapy 2004   BREAST CA  . Polyp of colon 2002   bleeding polyp of right ascending colon  . PUD (peptic ulcer disease)   . Seizures ([place])    epilepsy well controlled    Past Surgical History:  Procedure Laterality Date  . BREAST EXCISIONAL BIOPSY Left 2003   positive  . BREAST EXCISIONAL BIOPSY Left 2004   positive  . BREAST LUMPECTOMY Left 2003   BREAST CA  . BREAST LUMPECTOMY Left 2004   BREAST CA  . HEMICOLECTOMY Right    bowel obstruction  . HEMORRHOID SURGERY    . THORACOTOMY/LOBECTOMY  1999   carcinoid tumor  . TOTAL VAGINAL HYSTERECTOMY    . VARICOSE VEIN SURGERY      There were no vitals filed for this visit.  Subjective Assessment - 03/24/18 1454    Subjective  Patient rpeorts she has not been able to perform a lot of exercises for the  past month or so secondary to increased stress with coordinating a funeral for her husband. Patient reports she might move to [place] with her daughter. Patient states she started seeing a pelvic health therapist.    Pertinent History  Previously seen for balance disorders and for neck pain over the past year. Pt reports R knee feels sore today but is not painful.    Limitations  Walking;Sitting    How long can you walk comfortably?  3 min     Diagnostic tests  None    Patient Stated Goals  To be able to walk without an walker.     Currently in Pain?  No/denies       TREATMENT Therapeutic Exercise Ambulation 134ft with B UE to perform HHA with therapist Nustep with UE with  Both LE and UE to improve strengthening -- 10 min, x 4 level Standing for time without UE support - 30sec x 3 Sit to stands from 21" height-2 x 5  Ambulation with rollator walker - 363ft with one standing rest break Patient reports increased fatigue at the end of the session   PT Education - 03/24/18 1456  Education provided  Yes    Education Details  form/technique with exercise    Person(s) Educated  Patient    Methods  Explanation;Demonstration    Comprehension  Verbalized understanding;Returned demonstration          PT Long Term Goals - 03/24/18 1459      PT LONG TERM GOAL #1   Title  Patient will be independent with HEP to continue benefits of therapy after discharge.    Baseline  Requires moderate cueing with performance of exercise; 12/02/2017: moderate cueing to complete HEP; 01/20/2018: Performs HEP with ambulation    Time  8    Period  Weeks    Status  Achieved      PT LONG TERM GOAL #2   Title  Patient will improve TUG to under 12 seconds to indicate signficant improvement in fall risk.     Baseline  TUG: 24sec; 03/26/2017: TUG: 22sec; 05/12/17: 21.7 sec; 05/14/2017: 19sec; 08/26/2016: 14.75; 10/14/2017: 18sec; 12/02/2017: 16sec; ; 03/24/2018: 17sec    Time  8    Period  Weeks    Status   On-going      PT LONG TERM GOAL #3   Title  Patient will improve 63minWT to over 1052ft to indicate functional improvement in walking tolerance and greater ability to walk in stores before requiring a sitting rest break    Baseline  493ft; 03/26/2017: 516ft; 05/12/17: 459ft (primary deficit is DOE); 07/14/17: 449ft DOE; 08/26/2017: 32ft; 10/14/2017: 423ft ; 12/02/2017: 276ft; 01/20/2018: 426ft    Time  8    Period  Weeks    Status  On-going      PT LONG TERM GOAL #4   Title  Patient will demonstrate ability to stand for over 2 min to allow for improved ability to perform household chores at home.     Baseline  able to stand for 50sec before requiring a sitting rest break; 03/26/2017: 60 sec before requiring a break; 05/12/17: 60 seconds per pt subjective report; 07/14/17: 30sec 08/26/2017: 50sec; 10/14/2017: 60sec  12/02/2017: 45sec; 01/20/2018: 38sec; 03/24/2018: 28sec    Time  8    Period  Weeks    Status  On-going      PT LONG TERM GOAL #5   Title  Pt will decrease 5TSTS to <12 seconds from a chair at 19in in height in order to demonstrate clinically significant improvement in LE strength    Baseline  5xSTS: 14 sec from 22" height chair; 03/26/2017: 12.5sec; 05/12/17: 13.4 sec; 07/14/17: Deffered; 08/26/2017: 21sec from 21" height; 10/14/2017: 19" height 19 sec; 12/02/2017: 15;  at a height of 23in; 01/20/2018: 13sec from 23in chair; 03/24/2018: 18sec from 21" chair     Time  8    Period  Weeks    Status  On-going            Plan - 03/24/18 1456    Clinical Impression Statement  Patient demonstrates decrease in functional measures today as she has not been performing her HEP secondary to organizing a funeral for her husband who is on hospice care. Patient demonstrates increased weakness today and increased fatigue with performance of exercises today requiring sitting rest breaks to perform. Patient requires UE support to perform standing and is unable to stand for a long period of time without increase in  fatigue requiring a sitting rest break. Patient will benefit from further skilled therapy to maintain available strength and improve functional strenghth.     Rehab Potential  Fair  Clinical Impairments Affecting Rehab Potential  Positive: motivation; Negative: age, chronicity, bilateral knee pain    PT Frequency  2x / week    PT Duration  8 weeks    PT Treatment/Interventions  ADLs/Self Care Home Management;Aquatic Therapy;Canalith Repostioning;Cryotherapy;Electrical Stimulation;Iontophoresis 4mg /ml Dexamethasone;Moist Heat;Traction;Ultrasound;DME Instruction;Gait training;Stair training;Functional mobility training;Therapeutic activities;Therapeutic exercise;Balance training;Neuromuscular re-education;Patient/family education;Wheelchair mobility training;Manual techniques;Passive range of motion;Vestibular    PT Next Visit Plan  Progress strengthening, reinforce HEP    PT Home Exercise Plan  Cervical retraction    Consulted and Agree with Plan of Care  Patient;Family member/caregiver       Patient will benefit from skilled therapeutic intervention in order to improve the following deficits and impairments:  Abnormal gait, Decreased strength, Obesity, Decreased activity tolerance, Difficulty walking, Decreased balance, Decreased mobility, Decreased coordination, Increased muscle spasms, Postural dysfunction, Hypomobility, Decreased range of motion, Decreased endurance  Visit Diagnosis: Muscle weakness (generalized)  Unsteadiness on feet  Difficulty in walking, not elsewhere classified     Problem List Patient Active Problem List   Diagnosis Date Noted  . Heart palpitations 01/06/2018  . Cystocele with rectocele 10/30/2017  . Rectocele 10/30/2017  . Microcytic red blood cells 09/23/2017  . Localized edema 02/13/2017  . Hemorrhoids 05/17/2016  . Obesity (BMI 30-39.9) 04/26/2016  . Multiple lung nodules 04/19/2016  . Chest pain 04/17/2016  . Partial symptomatic epilepsy with  complex partial seizures, not intractable, without status epilepticus ([place]) 06/30/2015  . Bronchiectasis without complication ([place]) 00/86/7619  . Imbalance 11/08/2014  . Essential (primary) hypertension 11/08/2014  . Personal history of malignant neoplasm of breast 11/08/2014  . History of malignant carcinoid tumor of bronchus and lung 11/08/2014  . H/O peptic ulcer 11/08/2014  . Asthma, mild intermittent 11/08/2014  . PE (pulmonary embolism) 11/08/2014  . Gonalgia 11/08/2014  . Leg varices 11/08/2014    Blythe Stanford, PT DPT 03/24/2018, 3:02 PM  [facility] [address] [place], [address] Phone: [phone]   Fax:  [phone]  Name: Rebecca Wolf MRN: [id number] Date of Birth: 1924/11/01

## 2018-03-26 DIAGNOSIS — R159 Full incontinence of feces: Secondary | ICD-10-CM | POA: Diagnosis not present

## 2018-03-31 ENCOUNTER — Ambulatory Visit: Payer: Medicare Other | Attending: Physical Medicine and Rehabilitation

## 2018-03-31 DIAGNOSIS — R262 Difficulty in walking, not elsewhere classified: Secondary | ICD-10-CM | POA: Insufficient documentation

## 2018-03-31 DIAGNOSIS — M6281 Muscle weakness (generalized): Secondary | ICD-10-CM | POA: Insufficient documentation

## 2018-03-31 DIAGNOSIS — R2681 Unsteadiness on feet: Secondary | ICD-10-CM | POA: Diagnosis not present

## 2018-03-31 NOTE — Therapy (Signed)
[facility] [address] [place], [address] Phone: [phone]   Fax:  [phone]  Physical Therapy Treatment  Patient Details  Name: Rebecca Wolf MRN: [id number] Date of Birth: September 22, 1924 No data recorded  Encounter Date: 03/31/2018  PT End of Session - 03/31/18 1425    Visit Number  46    Number of Visits  51    Date for PT Re-Evaluation  05/05/18    Authorization Type  6/10    PT Start Time  1400    PT Stop Time  1430    PT Time Calculation (min)  30 min    Equipment Utilized During Treatment  Gait belt    Activity Tolerance  Patient tolerated treatment well    Behavior During Therapy  WFL for tasks assessed/performed       Past Medical History:  Diagnosis Date  . Asthma   . Breast cancer ([facility]) 2003   LT LUMPECTOMY  . Breast cancer ([place]) 2004   LT LUMPECTOMY  . 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  . GERD (gastroesophageal reflux disease)   . Hemorrhoids   . Hypertension   . Personal history of chemotherapy 2004   BREAST CA  . Personal history of radiation therapy 2003   BREAST CA  . Personal history of radiation therapy 2004   BREAST CA  . Polyp of colon 2002   bleeding polyp of right ascending colon  . PUD (peptic ulcer disease)   . Seizures ([place])    epilepsy well controlled    Past Surgical History:  Procedure Laterality Date  . BREAST EXCISIONAL BIOPSY Left 2003   positive  . BREAST EXCISIONAL BIOPSY Left 2004   positive  . BREAST LUMPECTOMY Left 2003   BREAST CA  . BREAST LUMPECTOMY Left 2004   BREAST CA  . HEMICOLECTOMY Right    bowel obstruction  . HEMORRHOID SURGERY    . THORACOTOMY/LOBECTOMY  1999   carcinoid tumor  . TOTAL VAGINAL HYSTERECTOMY    . VARICOSE VEIN SURGERY      There were no vitals filed for this visit.  Subjective Assessment - 03/31/18 1415    Subjective  Patient reports her husband passed away and she has not been performing as  much exercise secondary to this news and planning the funeral.     Pertinent History  Previously seen for balance disorders and for neck pain over the past year. Pt reports R knee feels sore today but is not painful.    Limitations  Walking;Sitting    How long can you walk comfortably?  3 min     Diagnostic tests  None    Patient Stated Goals  To be able to walk without an walker.     Currently in Pain?  No/denies        TREATMENT Therapeutic Exercise Ambulation 183ft with B UE to perform HHA with therapist Nustep with UE with  Both LE and UE to improve strengthening -- 10 min, x 5 level Sit to stands from 21" height-x3 with marches once standing  Seated hip flexion with 5# weight - x10  Patient reports increased fatigue at the end of the session   PT Education - 03/31/18 1424    Education provided  Yes    Education Details  form/technique with exercise    Person(s) Educated  Patient    Methods  Explanation;Demonstration    Comprehension  Verbalized understanding;Returned demonstration  PT Long Term Goals - 03/24/18 1459      PT LONG TERM GOAL #1   Title  Patient will be independent with HEP to continue benefits of therapy after discharge.    Baseline  Requires moderate cueing with performance of exercise; 12/02/2017: moderate cueing to complete HEP; 01/20/2018: Performs HEP with ambulation    Time  8    Period  Weeks    Status  Achieved      PT LONG TERM GOAL #2   Title  Patient will improve TUG to under 12 seconds to indicate signficant improvement in fall risk.     Baseline  TUG: 24sec; 03/26/2017: TUG: 22sec; 05/12/17: 21.7 sec; 05/14/2017: 19sec; 08/26/2016: 14.75; 10/14/2017: 18sec; 12/02/2017: 16sec; ; 03/24/2018: 17sec    Time  8    Period  Weeks    Status  On-going      PT LONG TERM GOAL #3   Title  Patient will improve 64minWT to over 1039ft to indicate functional improvement in walking tolerance and greater ability to walk in stores before requiring a sitting  rest break    Baseline  467ft; 03/26/2017: 534ft; 05/12/17: 489ft (primary deficit is DOE); 07/14/17: 449ft DOE; 08/26/2017: 323ft; 10/14/2017: 44ft ; 12/02/2017: 286ft; 01/20/2018: 4109ft    Time  8    Period  Weeks    Status  On-going      PT LONG TERM GOAL #4   Title  Patient will demonstrate ability to stand for over 2 min to allow for improved ability to perform household chores at home.     Baseline  able to stand for 50sec before requiring a sitting rest break; 03/26/2017: 60 sec before requiring a break; 05/12/17: 60 seconds per pt subjective report; 07/14/17: 30sec 08/26/2017: 50sec; 10/14/2017: 60sec  12/02/2017: 45sec; 01/20/2018: 38sec; 03/24/2018: 28sec    Time  8    Period  Weeks    Status  On-going      PT LONG TERM GOAL #5   Title  Pt will decrease 5TSTS to <12 seconds from a chair at 19in in height in order to demonstrate clinically significant improvement in LE strength    Baseline  5xSTS: 14 sec from 22" height chair; 03/26/2017: 12.5sec; 05/12/17: 13.4 sec; 07/14/17: Deffered; 08/26/2017: 21sec from 21" height; 10/14/2017: 19" height 19 sec; 12/02/2017: 15;  at a height of 23in; 01/20/2018: 13sec from 23in chair; 03/24/2018: 18sec from 21" chair     Time  8    Period  Weeks    Status  On-going            Plan - 03/31/18 1426    Clinical Impression Statement  Performed shortened appointment secondary to being 15 min late to her appointment. COnitnued to focus on improving strength and balance with standing based exercises patient conitnues to require assitance with standing with performance of sit to stand based exercises (with HHA). Patient demosntrates decreased endurance with ambulation and will benefit from furhter skilled therapy to return to prior level of function.     Rehab Potential  Fair    Clinical Impairments Affecting Rehab Potential  Positive: motivation; Negative: age, chronicity, bilateral knee pain    PT Frequency  2x / week    PT Duration  8 weeks    PT  Treatment/Interventions  ADLs/Self Care Home Management;Aquatic Therapy;Canalith Repostioning;Cryotherapy;Electrical Stimulation;Iontophoresis 4mg /ml Dexamethasone;Moist Heat;Traction;Ultrasound;DME Instruction;Gait training;Stair training;Functional mobility training;Therapeutic activities;Therapeutic exercise;Balance training;Neuromuscular re-education;Patient/family education;Wheelchair mobility training;Manual techniques;Passive range of motion;Vestibular    PT Next Visit Plan  Progress strengthening, reinforce HEP    PT Home Exercise Plan  Cervical retraction    Consulted and Agree with Plan of Care  Patient;Family member/caregiver       Patient will benefit from skilled therapeutic intervention in order to improve the following deficits and impairments:  Abnormal gait, Decreased strength, Obesity, Decreased activity tolerance, Difficulty walking, Decreased balance, Decreased mobility, Decreased coordination, Increased muscle spasms, Postural dysfunction, Hypomobility, Decreased range of motion, Decreased endurance  Visit Diagnosis: Muscle weakness (generalized)  Unsteadiness on feet  Difficulty in walking, not elsewhere classified     Problem List Patient Active Problem List   Diagnosis Date Noted  . Heart palpitations 01/06/2018  . Cystocele with rectocele 10/30/2017  . Rectocele 10/30/2017  . Microcytic red blood cells 09/23/2017  . Localized edema 02/13/2017  . Hemorrhoids 05/17/2016  . Obesity (BMI 30-39.9) 04/26/2016  . Multiple lung nodules 04/19/2016  . Chest pain 04/17/2016  . Partial symptomatic epilepsy with complex partial seizures, not intractable, without status epilepticus ([place]) 06/30/2015  . Bronchiectasis without complication ([facility]) 54/65/0354  . Imbalance 11/08/2014  . Essential (primary) hypertension 11/08/2014  . Personal history of malignant neoplasm of breast 11/08/2014  . History of malignant carcinoid tumor of bronchus and lung 11/08/2014  . H/O peptic  ulcer 11/08/2014  . Asthma, mild intermittent 11/08/2014  . PE (pulmonary embolism) 11/08/2014  . Gonalgia 11/08/2014  . Leg varices 11/08/2014    Blythe Stanford, PT DPT 03/31/2018, 2:32 PM  [facility] [address], [place], [address] Phone: [phone]   Fax:  [phone]  Name: Rebecca Wolf MRN: [id number] Date of Birth: Jul 21, 1924

## 2018-04-07 ENCOUNTER — Ambulatory Visit: Payer: Medicare Other

## 2018-04-07 DIAGNOSIS — I2699 Other pulmonary embolism without acute cor pulmonale: Secondary | ICD-10-CM | POA: Diagnosis not present

## 2018-04-07 DIAGNOSIS — Z780 Asymptomatic menopausal state: Secondary | ICD-10-CM | POA: Diagnosis not present

## 2018-04-07 DIAGNOSIS — I10 Essential (primary) hypertension: Secondary | ICD-10-CM | POA: Diagnosis not present

## 2018-04-07 DIAGNOSIS — R5383 Other fatigue: Secondary | ICD-10-CM | POA: Diagnosis not present

## 2018-04-07 DIAGNOSIS — R000 Tachycardia, unspecified: Secondary | ICD-10-CM | POA: Diagnosis not present

## 2018-04-07 DIAGNOSIS — G40209 Localization-related (focal) (partial) symptomatic epilepsy and epileptic syndromes with complex partial seizures, not intractable, without status epilepticus: Secondary | ICD-10-CM | POA: Diagnosis not present

## 2018-04-07 DIAGNOSIS — G629 Polyneuropathy, unspecified: Secondary | ICD-10-CM | POA: Diagnosis not present

## 2018-04-07 DIAGNOSIS — E559 Vitamin D deficiency, unspecified: Secondary | ICD-10-CM | POA: Insufficient documentation

## 2018-04-07 DIAGNOSIS — E119 Type 2 diabetes mellitus without complications: Secondary | ICD-10-CM | POA: Insufficient documentation

## 2018-04-07 DIAGNOSIS — J4520 Mild intermittent asthma, uncomplicated: Secondary | ICD-10-CM | POA: Diagnosis not present

## 2018-04-07 DIAGNOSIS — J479 Bronchiectasis, uncomplicated: Secondary | ICD-10-CM | POA: Diagnosis not present

## 2018-04-07 DIAGNOSIS — Z23 Encounter for immunization: Secondary | ICD-10-CM | POA: Diagnosis not present

## 2018-04-07 DIAGNOSIS — Z853 Personal history of malignant neoplasm of breast: Secondary | ICD-10-CM | POA: Diagnosis not present

## 2018-04-07 DIAGNOSIS — R799 Abnormal finding of blood chemistry, unspecified: Secondary | ICD-10-CM | POA: Diagnosis not present

## 2018-04-07 DIAGNOSIS — N816 Rectocele: Secondary | ICD-10-CM | POA: Diagnosis not present

## 2018-04-14 ENCOUNTER — Ambulatory Visit: Payer: Medicare Other

## 2018-04-14 DIAGNOSIS — M6281 Muscle weakness (generalized): Secondary | ICD-10-CM

## 2018-04-14 DIAGNOSIS — R262 Difficulty in walking, not elsewhere classified: Secondary | ICD-10-CM

## 2018-04-14 DIAGNOSIS — R2681 Unsteadiness on feet: Secondary | ICD-10-CM

## 2018-04-14 NOTE — Therapy (Signed)
[facility] [address] [place], [address] Phone: [phone]   Fax:  [phone]  Physical Therapy Treatment  Patient Details  Name: Rebecca Wolf MRN: [id number] Date of Birth: 06/11/1925 No data recorded  Encounter Date: 04/14/2018  PT End of Session - 04/14/18 1342    Visit Number  47    Number of Visits  51    Date for PT Re-Evaluation  05/05/18    Authorization Type  7/10    PT Start Time  1300    PT Stop Time  1345    PT Time Calculation (min)  45 min    Equipment Utilized During Treatment  Gait belt    Activity Tolerance  Patient tolerated treatment well    Behavior During Therapy  WFL for tasks assessed/performed       Past Medical History:  Diagnosis Date  . Asthma   . Breast cancer ([place]) 2003   LT LUMPECTOMY  . Breast cancer ([place]) 2004   LT LUMPECTOMY  . 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  . GERD (gastroesophageal reflux disease)   . Hemorrhoids   . Hypertension   . Personal history of chemotherapy 2004   BREAST CA  . Personal history of radiation therapy 2003   BREAST CA  . Personal history of radiation therapy 2004   BREAST CA  . Polyp of colon 2002   bleeding polyp of right ascending colon  . PUD (peptic ulcer disease)   . Seizures ([place])    epilepsy well controlled    Past Surgical History:  Procedure Laterality Date  . BREAST EXCISIONAL BIOPSY Left 2003   positive  . BREAST EXCISIONAL BIOPSY Left 2004   positive  . BREAST LUMPECTOMY Left 2003   BREAST CA  . BREAST LUMPECTOMY Left 2004   BREAST CA  . HEMICOLECTOMY Right    bowel obstruction  . HEMORRHOID SURGERY    . THORACOTOMY/LOBECTOMY  1999   carcinoid tumor  . TOTAL VAGINAL HYSTERECTOMY    . VARICOSE VEIN SURGERY      There were no vitals filed for this visit.  Subjective Assessment - 04/14/18 1333    Subjective  Patient reports she has not been performing many exercises. Patient states  she has tried to walk as much as possible, but continues to demonstrate difficulty.     Pertinent History  Previously seen for balance disorders and for neck pain over the past year. Pt reports R knee feels sore today but is not painful.    Limitations  Walking;Sitting    How long can you walk comfortably?  3 min     Diagnostic tests  None    Patient Stated Goals  To be able to walk without an walker.     Currently in Pain?  No/denies       TREATMENT Therapeutic Exercise Ambulation 265ft with B UE to perform HHA with therapist Nustep with UE with  Both LE and UE to improve strengthening -- 10 min, x 5 level Step ups onto 6" step - x 10 Hip abduction in standing with UE support - x 10  Hip extension in standing with UE support - x 10    Patient reports increased fatigue at the end of the session   PT Education - 04/14/18 1342    Education provided  Yes    Education Details  form/technique with exercise    Person(s) Educated  Patient  Methods  Explanation;Demonstration    Comprehension  Verbalized understanding;Returned demonstration          PT Long Term Goals - 03/24/18 1459      PT LONG TERM GOAL #1   Title  Patient will be independent with HEP to continue benefits of therapy after discharge.    Baseline  Requires moderate cueing with performance of exercise; 12/02/2017: moderate cueing to complete HEP; 01/20/2018: Performs HEP with ambulation    Time  8    Period  Weeks    Status  Achieved      PT LONG TERM GOAL #2   Title  Patient will improve TUG to under 12 seconds to indicate signficant improvement in fall risk.     Baseline  TUG: 24sec; 03/26/2017: TUG: 22sec; 05/12/17: 21.7 sec; 05/14/2017: 19sec; 08/26/2016: 14.75; 10/14/2017: 18sec; 12/02/2017: 16sec; ; 03/24/2018: 17sec    Time  8    Period  Weeks    Status  On-going      PT LONG TERM GOAL #3   Title  Patient will improve 52minWT to over 1036ft to indicate functional improvement in walking tolerance and greater  ability to walk in stores before requiring a sitting rest break    Baseline  416ft; 03/26/2017: 584ft; 05/12/17: 433ft (primary deficit is DOE); 07/14/17: 459ft DOE; 08/26/2017: 353ft; 10/14/2017: 41ft ; 12/02/2017: 25ft; 01/20/2018: 464ft    Time  8    Period  Weeks    Status  On-going      PT LONG TERM GOAL #4   Title  Patient will demonstrate ability to stand for over 2 min to allow for improved ability to perform household chores at home.     Baseline  able to stand for 50sec before requiring a sitting rest break; 03/26/2017: 60 sec before requiring a break; 05/12/17: 60 seconds per pt subjective report; 07/14/17: 30sec 08/26/2017: 50sec; 10/14/2017: 60sec  12/02/2017: 45sec; 01/20/2018: 38sec; 03/24/2018: 28sec    Time  8    Period  Weeks    Status  On-going      PT LONG TERM GOAL #5   Title  Pt will decrease 5TSTS to <12 seconds from a chair at 19in in height in order to demonstrate clinically significant improvement in LE strength    Baseline  5xSTS: 14 sec from 22" height chair; 03/26/2017: 12.5sec; 05/12/17: 13.4 sec; 07/14/17: Deffered; 08/26/2017: 21sec from 21" height; 10/14/2017: 19" height 19 sec; 12/02/2017: 15;  at a height of 23in; 01/20/2018: 13sec from 23in chair; 03/24/2018: 18sec from 21" chair     Time  8    Period  Weeks    Status  On-going            Plan - 04/14/18 1605    Clinical Impression Statement  Patient demonstrated improvement with ambulation quantity today indicating functional carryover between sessions. Patient continues to demonstrate difficulty with performing step ups indicating poor quadriceps strength. Patient will benefit from further skilled therapy.     Rehab Potential  Fair    Clinical Impairments Affecting Rehab Potential  Positive: motivation; Negative: age, chronicity, bilateral knee pain    PT Frequency  2x / week    PT Duration  8 weeks    PT Treatment/Interventions  ADLs/Self Care Home Management;Aquatic Therapy;Canalith  Repostioning;Cryotherapy;Electrical Stimulation;Iontophoresis 4mg /ml Dexamethasone;Moist Heat;Traction;Ultrasound;DME Instruction;Gait training;Stair training;Functional mobility training;Therapeutic activities;Therapeutic exercise;Balance training;Neuromuscular re-education;Patient/family education;Wheelchair mobility training;Manual techniques;Passive range of motion;Vestibular    PT Next Visit Plan  Progress strengthening, reinforce HEP    PT Home  Exercise Plan  Cervical retraction    Consulted and Agree with Plan of Care  Patient;Family member/caregiver       Patient will benefit from skilled therapeutic intervention in order to improve the following deficits and impairments:  Abnormal gait, Decreased strength, Obesity, Decreased activity tolerance, Difficulty walking, Decreased balance, Decreased mobility, Decreased coordination, Increased muscle spasms, Postural dysfunction, Hypomobility, Decreased range of motion, Decreased endurance  Visit Diagnosis: Muscle weakness (generalized)  Unsteadiness on feet  Difficulty in walking, not elsewhere classified     Problem List Patient Active Problem List   Diagnosis Date Noted  . Heart palpitations 01/06/2018  . Cystocele with rectocele 10/30/2017  . Rectocele 10/30/2017  . Microcytic red blood cells 09/23/2017  . Localized edema 02/13/2017  . Hemorrhoids 05/17/2016  . Obesity (BMI 30-39.9) 04/26/2016  . Multiple lung nodules 04/19/2016  . Chest pain 04/17/2016  . Partial symptomatic epilepsy with complex partial seizures, not intractable, without status epilepticus ([place]) 06/30/2015  . Bronchiectasis without complication ([place]) 29/47/6546  . Imbalance 11/08/2014  . Essential (primary) hypertension 11/08/2014  . Personal history of malignant neoplasm of breast 11/08/2014  . History of malignant carcinoid tumor of bronchus and lung 11/08/2014  . H/O peptic ulcer 11/08/2014  . Asthma, mild intermittent 11/08/2014  . PE (pulmonary  embolism) 11/08/2014  . Gonalgia 11/08/2014  . Leg varices 11/08/2014    Blythe Stanford, PT DPT 04/14/2018, 4:13 PM  [facility] [address] [place], [address] Phone: [phone]   Fax:  [phone]  Name: Rebecca Wolf MRN: [id number] Date of Birth: 1925-01-31

## 2018-04-17 DIAGNOSIS — M85852 Other specified disorders of bone density and structure, left thigh: Secondary | ICD-10-CM | POA: Diagnosis not present

## 2018-04-17 DIAGNOSIS — R159 Full incontinence of feces: Secondary | ICD-10-CM | POA: Diagnosis not present

## 2018-04-17 DIAGNOSIS — Z780 Asymptomatic menopausal state: Secondary | ICD-10-CM | POA: Diagnosis not present

## 2018-04-19 IMAGING — CT CT CHEST W/O CM
2 of 3 series · 15 of 36 positions shown, 18 images · non-contrast
Comparison: CT chest dated 04/14/2016

CLINICAL DATA: Follow-up pulmonary nodules, history of breast
cancer

EXAM:
CT CHEST WITHOUT CONTRAST
TECHNIQUE: Multidetector CT imaging of the chest was performed following the
standard protocol without IV contrast.

[Series 2: thorax · axial · 0.63mm/px · z∈[-323,-69]mm · 12 of 151 slices shown, 15 images]
[im 12/151  mediastinal]
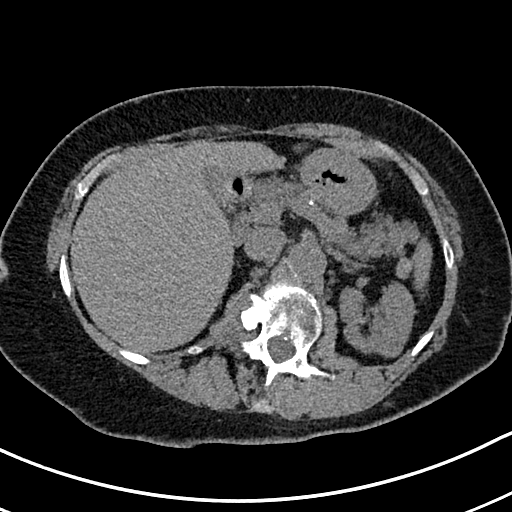
[im 12/151  lung]
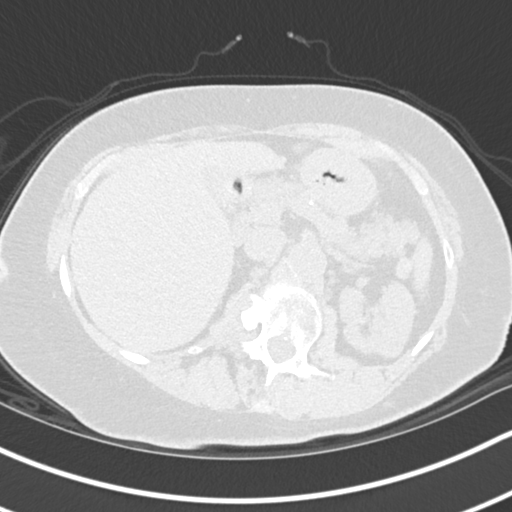
[im 23/151  lung]
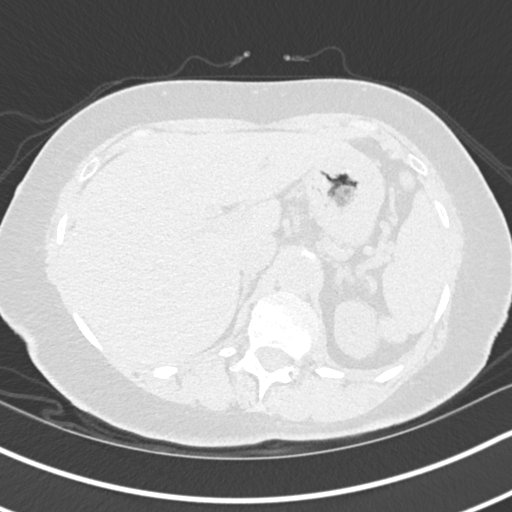
[im 34/151  lung]
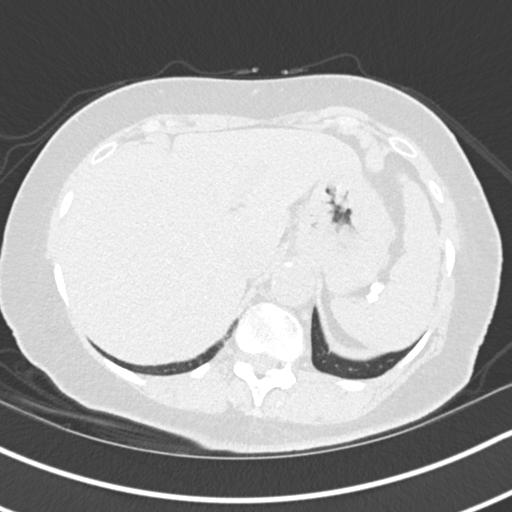
[im 45/151  lung]
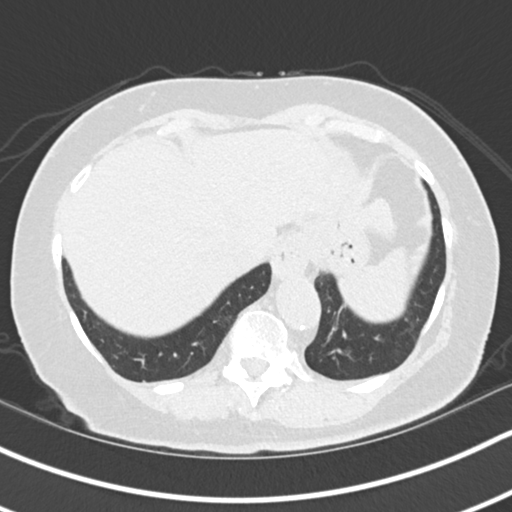
[im 56/151  mediastinal]
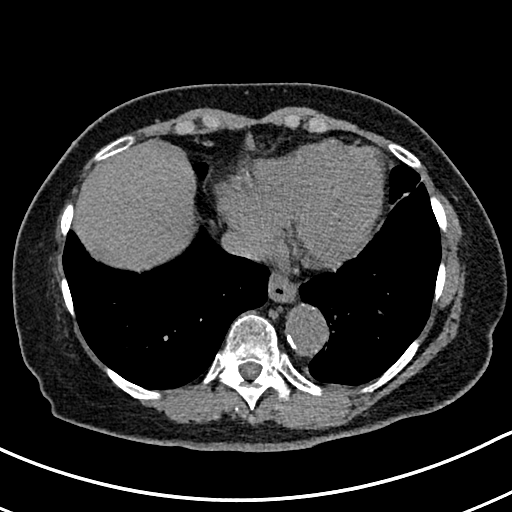
[im 56/151  lung]
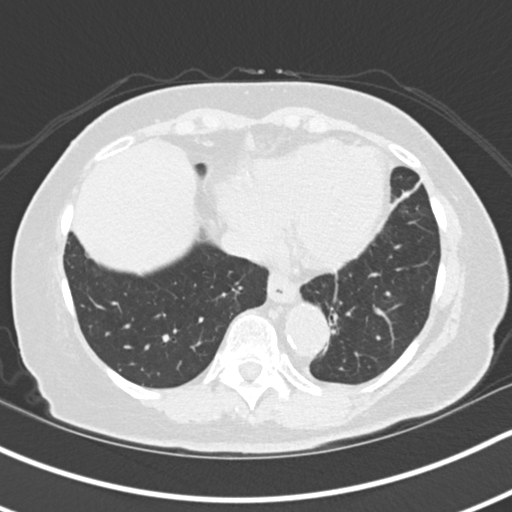
[im 67/151  lung]
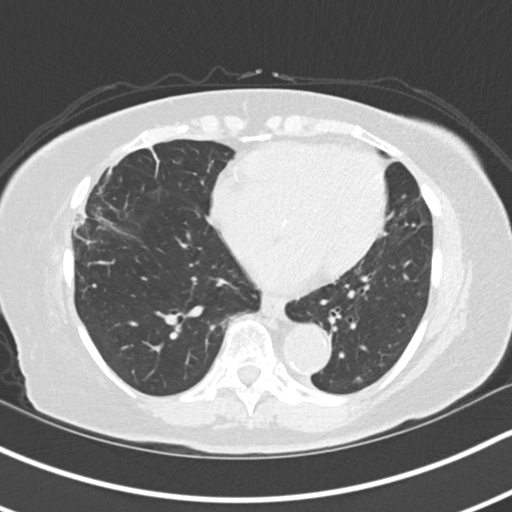
[im 84/151  lung]
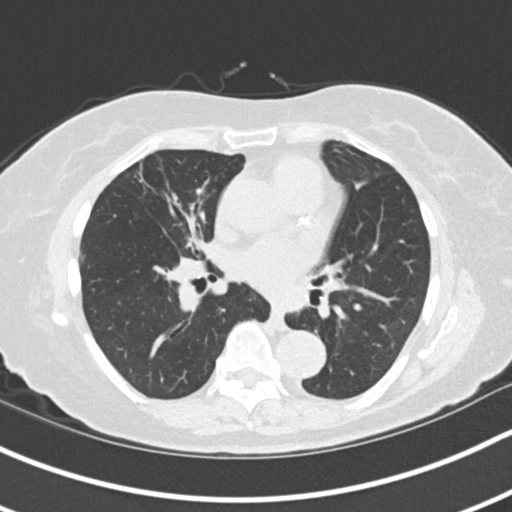
[im 95/151  lung]
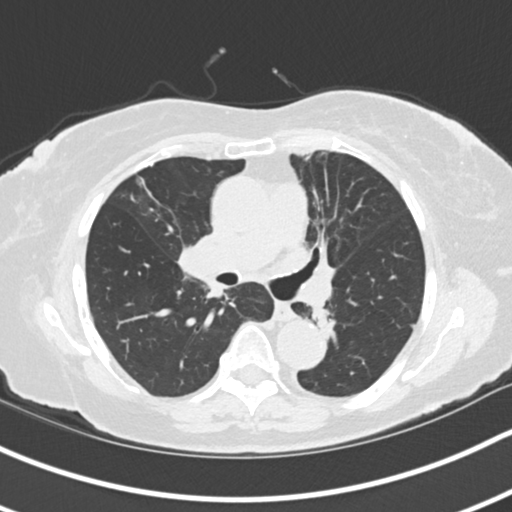
[im 106/151  mediastinal]
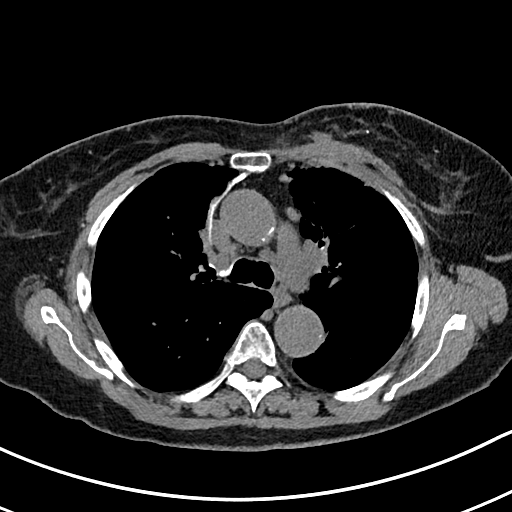
[im 106/151  lung]
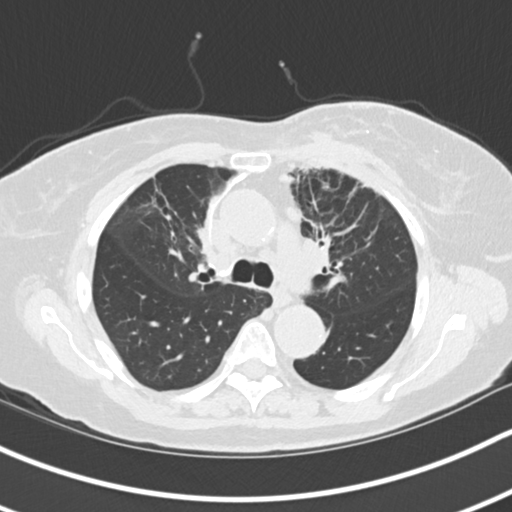
[im 117/151  lung]
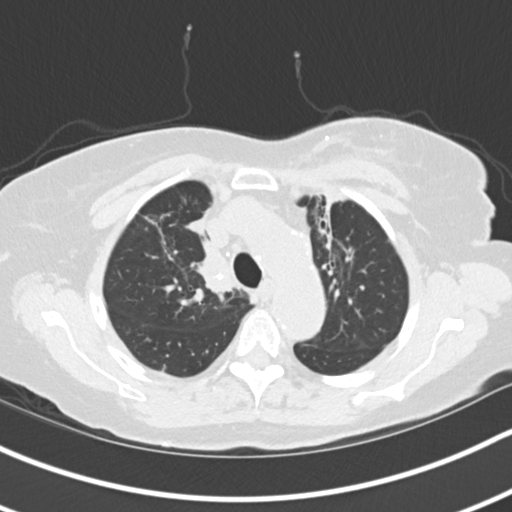
[im 128/151  lung]
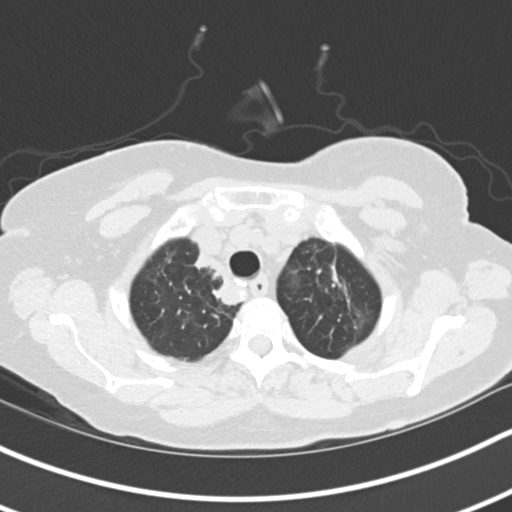
[im 139/151  lung]
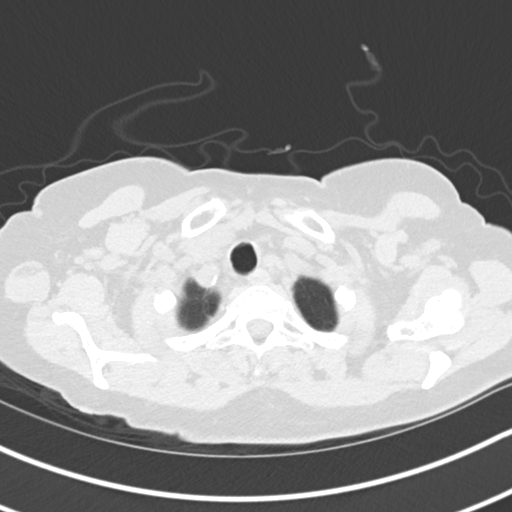

[Series 5: coronal · coronal · 0.62mm/px · 3 of 112 slices shown]
[im 23/112  lung]
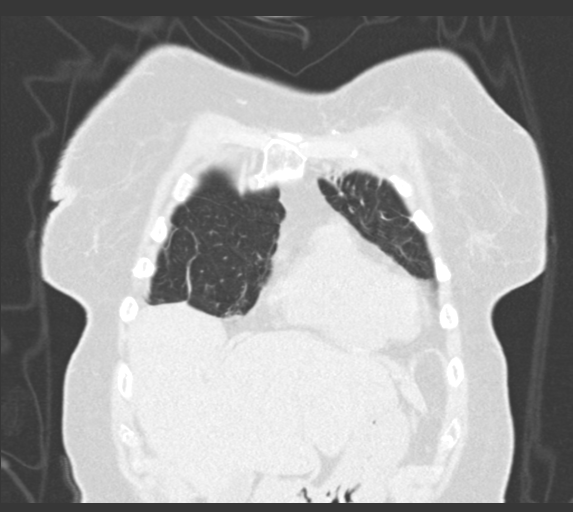
[im 45/112  lung]
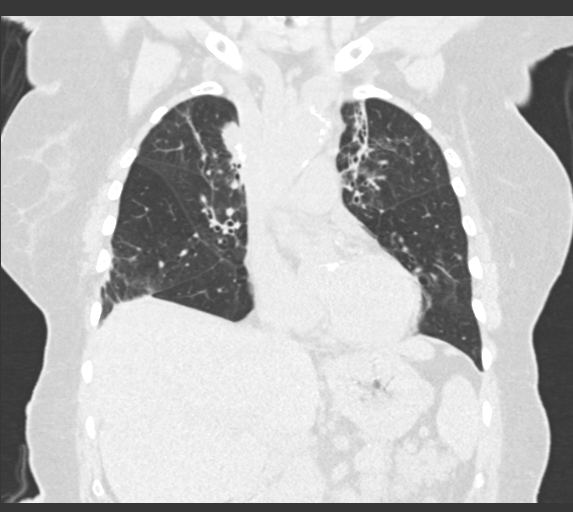
[im 67/112  lung]
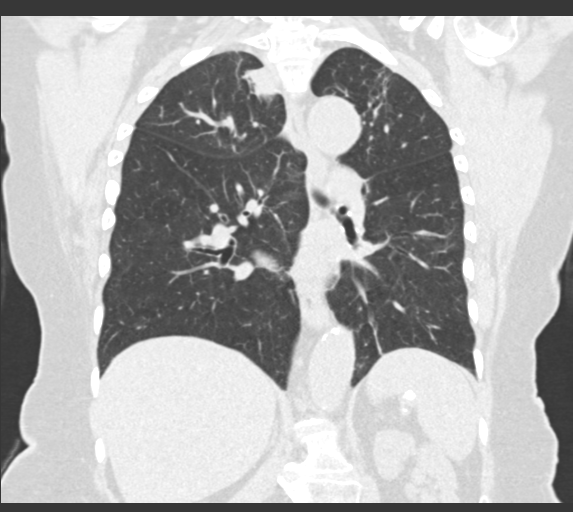

[15 of 36 positions shown; findings below may reference images not displayed]

FINDINGS: Cardiovascular: The heart is normal in size. No pericardial
effusion.

Three vessel coronary atherosclerosis.

Atherosclerotic calcifications of the aortic arch.

Mediastinum/Nodes: Small mediastinal lymph nodes, including a
dominant 11 mm node in AP with (series 2/ image 41), unchanged.

Visualized thyroid is unremarkable.

Lungs/Pleura: 3.9 x 1.9 cm parenchymal opacity along the medial
right upper lobe, previously described as a mediastinal node,
grossly unchanged. This extends superiorly along the medial aspect
of the right lung apex (series 3/image 21).

Numerous small bilateral pulmonary nodules, unchanged, including:

--6 mm nodule in the right upper lobe (series 3/ image 50)

--3 mm nodule in the lingula (series 3/image 55)

--5 mm nodule in the right middle lobe (series 3/ image 71)

--7 mm nodule in the right middle lobe (series 3/image 76)

--8 mm nodule in the subpleural right lower lobe (series 3/image 77)

--4 mm nodule in the subpleural left lower lobe (series 3/image 79)

--6 mm nodule in the subpleural left lower lobe (series 3/ image 86)

Mild scarring/ bronchiectasis in the medial left upper lobe, likely
reflecting radiation changes.

No pleural effusion or pneumothorax.

Upper Abdomen: Visualized upper abdomen is notable for vascular
calcifications.

Musculoskeletal: Mild asymmetric glandular tissue with calcification
along the upper/ inner left breast (series 2/ image 41), possibly
reflecting the site of prior lumpectomy.

Degenerative changes of the visualized thoracolumbar spine.
IMPRESSION: 3.9 x 1.9 cm parenchymal opacity along the medial right upper lobe,
grossly unchanged. This remains indeterminate, although relative 3
month stability is reassuring. Follow-up CT chest is suggested in
3-6 months. If persistent, consider PET-CT.

Scattered small bilateral pulmonary nodules measuring up to 8 mm,
unchanged. Attention at the time of follow-up is suggested. Of note,
indolent metastases such as pulmonary carcinoid could have this
appearance.

Status post left breast lumpectomy. Radiation changes in the medial
left upper lobe.

Additional ancillary findings as above.

## 2018-04-21 ENCOUNTER — Ambulatory Visit: Payer: Medicare Other

## 2018-04-21 DIAGNOSIS — R2681 Unsteadiness on feet: Secondary | ICD-10-CM

## 2018-04-21 DIAGNOSIS — R262 Difficulty in walking, not elsewhere classified: Secondary | ICD-10-CM | POA: Diagnosis not present

## 2018-04-21 DIAGNOSIS — M6281 Muscle weakness (generalized): Secondary | ICD-10-CM | POA: Diagnosis not present

## 2018-04-21 NOTE — Therapy (Signed)
[facility] [address], [place], [address] Phone: [phone]   Fax:  [phone]  Physical Therapy Treatment  Patient Details  Name: Rebecca Wolf MRN: [id number] Date of Birth: [birth year] No data recorded  Encounter Date: 04/21/2018  PT End of Session - 04/21/18 1314    Visit Number  48    Number of Visits  51    Date for PT Re-Evaluation  05/05/18    Authorization Type  8/10    PT Start Time  1307    PT Stop Time  1345    PT Time Calculation (min)  38 min    Equipment Utilized During Treatment  Gait belt    Activity Tolerance  Patient tolerated treatment well    Behavior During Therapy  WFL for tasks assessed/performed       Past Medical History:  Diagnosis Date  . Asthma   . Breast cancer ([place]) 2003   LT LUMPECTOMY  . Breast cancer ([place]) 2004   LT LUMPECTOMY  . 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  . GERD (gastroesophageal reflux disease)   . Hemorrhoids   . Hypertension   . Personal history of chemotherapy 2004   BREAST CA  . Personal history of radiation therapy 2003   BREAST CA  . Personal history of radiation therapy 2004   BREAST CA  . Polyp of colon 2002   bleeding polyp of right ascending colon  . PUD (peptic ulcer disease)   . Seizures ([place])    epilepsy well controlled    Past Surgical History:  Procedure Laterality Date  . BREAST EXCISIONAL BIOPSY Left 2003   positive  . BREAST EXCISIONAL BIOPSY Left 2004   positive  . BREAST LUMPECTOMY Left 2003   BREAST CA  . BREAST LUMPECTOMY Left 2004   BREAST CA  . HEMICOLECTOMY Right    bowel obstruction  . HEMORRHOID SURGERY    . THORACOTOMY/LOBECTOMY  1999   carcinoid tumor  . TOTAL VAGINAL HYSTERECTOMY    . VARICOSE VEIN SURGERY      There were no vitals filed for this visit.  Subjective Assessment - 04/21/18 1312    Subjective  Patient reports she has been walking and went to pelvic health physical  therapy the past Friday. Patient states she has been performing walking at home where necessary.     Pertinent History  Previously seen for balance disorders and for neck pain over the past year. Pt reports R knee feels sore today but is not painful.    Limitations  Walking;Sitting    How long can you walk comfortably?  3 min     Diagnostic tests  None    Patient Stated Goals  To be able to walk without an walker.     Currently in Pain?  No/denies       TREATMENT Therapeutic Exercise Ambulation 115ft with B UE to perform HHA with therapist Nustep with UE with  Both LE and UE to improve strengthening -- 10 min, x 5 level Side Step ups onto 6" step - x 10 B Hip abduction in standing with UE support - x 10 B Hip extension in standing with UE support - x 10 B Prolonged standing for time - 30sec each x 2    Patient reports increased fatigue at the end of the session   PT Education - 04/21/18 1314    Education provided  Yes    Education  Details  form/technique with exercise    Person(s) Educated  Patient    Methods  Explanation;Demonstration    Comprehension  Verbalized understanding;Returned demonstration          PT Long Term Goals - 03/24/18 1459      PT LONG TERM GOAL #1   Title  Patient will be independent with HEP to continue benefits of therapy after discharge.    Baseline  Requires moderate cueing with performance of exercise; 12/02/2017: moderate cueing to complete HEP; 01/20/2018: Performs HEP with ambulation    Time  8    Period  Weeks    Status  Achieved      PT LONG TERM GOAL #2   Title  Patient will improve TUG to under 12 seconds to indicate signficant improvement in fall risk.     Baseline  TUG: 24sec; 03/26/2017: TUG: 22sec; 05/12/17: 21.7 sec; 05/14/2017: 19sec; 08/26/2016: 14.75; 10/14/2017: 18sec; 12/02/2017: 16sec; ; 03/24/2018: 17sec    Time  8    Period  Weeks    Status  On-going      PT LONG TERM GOAL #3   Title  Patient will improve 7minWT to over 1092ft  to indicate functional improvement in walking tolerance and greater ability to walk in stores before requiring a sitting rest break    Baseline  459ft; 03/26/2017: 548ft; 05/12/17: 417ft (primary deficit is DOE); 07/14/17: 418ft DOE; 08/26/2017: 367ft; 10/14/2017: 453ft ; 12/02/2017: 227ft; 01/20/2018: 428ft    Time  8    Period  Weeks    Status  On-going      PT LONG TERM GOAL #4   Title  Patient will demonstrate ability to stand for over 2 min to allow for improved ability to perform household chores at home.     Baseline  able to stand for 50sec before requiring a sitting rest break; 03/26/2017: 60 sec before requiring a break; 05/12/17: 60 seconds per pt subjective report; 07/14/17: 30sec 08/26/2017: 50sec; 10/14/2017: 60sec  12/02/2017: 45sec; 01/20/2018: 38sec; 03/24/2018: 28sec    Time  8    Period  Weeks    Status  On-going      PT LONG TERM GOAL #5   Title  Pt will decrease 5TSTS to <12 seconds from a chair at 19in in height in order to demonstrate clinically significant improvement in LE strength    Baseline  5xSTS: 14 sec from 22" height chair; 03/26/2017: 12.5sec; 05/12/17: 13.4 sec; 07/14/17: Deffered; 08/26/2017: 21sec from 21" height; 10/14/2017: 19" height 19 sec; 12/02/2017: 15;  at a height of 23in; 01/20/2018: 13sec from 23in chair; 03/24/2018: 18sec from 21" chair     Time  8    Period  Weeks    Status  On-going            Plan - 04/21/18 1330    Clinical Impression Statement  Practiced performing isometrics with performance sit to stands and standing for time interventions. After cueing to activate and coordinate breathing, patient able to perform prolonged standing for increased time after cueing. Patient will benefit from further skilled therapy to return to prior level of function.     Rehab Potential  Fair    Clinical Impairments Affecting Rehab Potential  Positive: motivation; Negative: age, chronicity, bilateral knee pain    PT Frequency  2x / week    PT Duration  8 weeks    PT  Treatment/Interventions  ADLs/Self Care Home Management;Aquatic Therapy;Canalith Repostioning;Cryotherapy;Electrical Stimulation;Iontophoresis 4mg /ml Dexamethasone;Moist Heat;Traction;Ultrasound;DME Instruction;Gait training;Stair training;Functional mobility training;Therapeutic  activities;Therapeutic exercise;Balance training;Neuromuscular re-education;Patient/family education;Wheelchair mobility training;Manual techniques;Passive range of motion;Vestibular    PT Next Visit Plan  Progress strengthening, reinforce HEP    PT Home Exercise Plan  Cervical retraction    Consulted and Agree with Plan of Care  Patient;Family member/caregiver       Patient will benefit from skilled therapeutic intervention in order to improve the following deficits and impairments:  Abnormal gait, Decreased strength, Obesity, Decreased activity tolerance, Difficulty walking, Decreased balance, Decreased mobility, Decreased coordination, Increased muscle spasms, Postural dysfunction, Hypomobility, Decreased range of motion, Decreased endurance  Visit Diagnosis: Muscle weakness (generalized)  Unsteadiness on feet  Difficulty in walking, not elsewhere classified     Problem List Patient Active Problem List   Diagnosis Date Noted  . Heart palpitations 01/06/2018  . Cystocele with rectocele 10/30/2017  . Rectocele 10/30/2017  . Microcytic red blood cells 09/23/2017  . Localized edema 02/13/2017  . Hemorrhoids 05/17/2016  . Obesity (BMI 30-39.9) 04/26/2016  . Multiple lung nodules 04/19/2016  . Chest pain 04/17/2016  . Partial symptomatic epilepsy with complex partial seizures, not intractable, without status epilepticus ([place]) 06/30/2015  . Bronchiectasis without complication ([place]) 59/45/8592  . Imbalance 11/08/2014  . Essential (primary) hypertension 11/08/2014  . Personal history of malignant neoplasm of breast 11/08/2014  . History of malignant carcinoid tumor of bronchus and lung 11/08/2014  . H/O peptic  ulcer 11/08/2014  . Asthma, mild intermittent 11/08/2014  . PE (pulmonary embolism) 11/08/2014  . Gonalgia 11/08/2014  . Leg varices 11/08/2014    Blythe Stanford, PT DPT 04/21/2018, 1:36 PM  [facility] [address], [place], [address] Phone: [phone]   Fax:  [phone]  Name: Rebecca Wolf MRN: [id number] Date of Birth: [birth year]

## 2018-04-24 DIAGNOSIS — R159 Full incontinence of feces: Secondary | ICD-10-CM | POA: Diagnosis not present

## 2018-04-28 ENCOUNTER — Ambulatory Visit: Payer: Medicare Other

## 2018-04-28 DIAGNOSIS — R262 Difficulty in walking, not elsewhere classified: Secondary | ICD-10-CM

## 2018-04-28 DIAGNOSIS — R2681 Unsteadiness on feet: Secondary | ICD-10-CM | POA: Diagnosis not present

## 2018-04-28 DIAGNOSIS — M6281 Muscle weakness (generalized): Secondary | ICD-10-CM | POA: Diagnosis not present

## 2018-04-28 NOTE — Therapy (Signed)
[facility] [address] [place], [address] Phone: [phone]   Fax:  [phone]  Physical Therapy Treatment  Patient Details  Name: Rebecca Wolf MRN: [id number] Date of Birth: 26-Jul-1924 No data recorded  Encounter Date: 04/28/2018  PT End of Session - 04/28/18 1336    Visit Number  49    Number of Visits  51    Date for PT Re-Evaluation  05/05/18    Authorization Type  9/10    PT Start Time  1307    PT Stop Time  1345    PT Time Calculation (min)  38 min    Equipment Utilized During Treatment  Gait belt    Activity Tolerance  Patient tolerated treatment well    Behavior During Therapy  WFL for tasks assessed/performed       Past Medical History:  Diagnosis Date  . Asthma   . Breast cancer ([place]) 2003   LT LUMPECTOMY  . Breast cancer ([place]) 2004   LT LUMPECTOMY  . 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  . GERD (gastroesophageal reflux disease)   . Hemorrhoids   . Hypertension   . Personal history of chemotherapy 2004   BREAST CA  . Personal history of radiation therapy 2003   BREAST CA  . Personal history of radiation therapy 2004   BREAST CA  . Polyp of colon 2002   bleeding polyp of right ascending colon  . PUD (peptic ulcer disease)   . Seizures ([place])    epilepsy well controlled    Past Surgical History:  Procedure Laterality Date  . BREAST EXCISIONAL BIOPSY Left 2003   positive  . BREAST EXCISIONAL BIOPSY Left 2004   positive  . BREAST LUMPECTOMY Left 2003   BREAST CA  . BREAST LUMPECTOMY Left 2004   BREAST CA  . HEMICOLECTOMY Right    bowel obstruction  . HEMORRHOID SURGERY    . THORACOTOMY/LOBECTOMY  1999   carcinoid tumor  . TOTAL VAGINAL HYSTERECTOMY    . VARICOSE VEIN SURGERY      There were no vitals filed for this visit.  Subjective Assessment - 04/28/18 1316    Subjective  Patient states she has been gong to her pelvic therapist and states things  are going well there. Patient states she walks at home.     Pertinent History  Previously seen for balance disorders and for neck pain over the past year. Pt reports R knee feels sore today but is not painful.    Limitations  Walking;Sitting    How long can you walk comfortably?  3 min     Diagnostic tests  None    Patient Stated Goals  To be able to walk without an walker.     Currently in Pain?  No/denies       TREATMENT Therapeutic Exercise Ambulation x 111ft , x 157ftwith B UE to perform HHA with therapist Nustep with UE with  Both LE and UE to improve strengthening -- 10 min, x 5 level Side Step ups onto 6" step - x 10 B Hip abduction in standing with UE support - x 10 B Hip extension in standing with UE support - x 10 B   Patient reports increased fatigue at the end of the session   PT Education - 04/28/18 1335    Education provided  Yes    Education Details  form/technique with exercise    Person(s) Educated  Patient    Methods  Explanation;Demonstration    Comprehension  Verbalized understanding;Returned demonstration          PT Long Term Goals - 03/24/18 1459      PT LONG TERM GOAL #1   Title  Patient will be independent with HEP to continue benefits of therapy after discharge.    Baseline  Requires moderate cueing with performance of exercise; 12/02/2017: moderate cueing to complete HEP; 01/20/2018: Performs HEP with ambulation    Time  8    Period  Weeks    Status  Achieved      PT LONG TERM GOAL #2   Title  Patient will improve TUG to under 12 seconds to indicate signficant improvement in fall risk.     Baseline  TUG: 24sec; 03/26/2017: TUG: 22sec; 05/12/17: 21.7 sec; 05/14/2017: 19sec; 08/26/2016: 14.75; 10/14/2017: 18sec; 12/02/2017: 16sec; ; 03/24/2018: 17sec    Time  8    Period  Weeks    Status  On-going      PT LONG TERM GOAL #3   Title  Patient will improve 26minWT to over 104ft to indicate functional improvement in walking tolerance and greater ability to  walk in stores before requiring a sitting rest break    Baseline  473ft; 03/26/2017: 526ft; 05/12/17: 430ft (primary deficit is DOE); 07/14/17: 449ft DOE; 08/26/2017: 378ft; 10/14/2017: 437ft ; 12/02/2017: 233ft; 01/20/2018: 440ft    Time  8    Period  Weeks    Status  On-going      PT LONG TERM GOAL #4   Title  Patient will demonstrate ability to stand for over 2 min to allow for improved ability to perform household chores at home.     Baseline  able to stand for 50sec before requiring a sitting rest break; 03/26/2017: 60 sec before requiring a break; 05/12/17: 60 seconds per pt subjective report; 07/14/17: 30sec 08/26/2017: 50sec; 10/14/2017: 60sec  12/02/2017: 45sec; 01/20/2018: 38sec; 03/24/2018: 28sec    Time  8    Period  Weeks    Status  On-going      PT LONG TERM GOAL #5   Title  Pt will decrease 5TSTS to <12 seconds from a chair at 19in in height in order to demonstrate clinically significant improvement in LE strength    Baseline  5xSTS: 14 sec from 22" height chair; 03/26/2017: 12.5sec; 05/12/17: 13.4 sec; 07/14/17: Deffered; 08/26/2017: 21sec from 21" height; 10/14/2017: 19" height 19 sec; 12/02/2017: 15;  at a height of 23in; 01/20/2018: 13sec from 23in chair; 03/24/2018: 18sec from 21" chair     Time  8    Period  Weeks    Status  On-going            Plan - 04/28/18 1337    Clinical Impression Statement  Patient demonstrates improvement with walking but demonstrates poor stability with ambulation and standing requiring UE support to perform. Patient demonstrates increased fasiculations with standing lacking standing stability. Patient will benefit from further skilled therapy to return to prior level of function.     Rehab Potential  Fair    Clinical Impairments Affecting Rehab Potential  Positive: motivation; Negative: age, chronicity, bilateral knee pain    PT Frequency  2x / week    PT Duration  8 weeks    PT Treatment/Interventions  ADLs/Self Care Home Management;Aquatic Therapy;Canalith  Repostioning;Cryotherapy;Electrical Stimulation;Iontophoresis 4mg /ml Dexamethasone;Moist Heat;Traction;Ultrasound;DME Instruction;Gait training;Stair training;Functional mobility training;Therapeutic activities;Therapeutic exercise;Balance training;Neuromuscular re-education;Patient/family education;Wheelchair mobility training;Manual techniques;Passive range of motion;Vestibular    PT  Next Visit Plan  Progress strengthening, reinforce HEP    PT Home Exercise Plan  Cervical retraction    Consulted and Agree with Plan of Care  Patient;Family member/caregiver       Patient will benefit from skilled therapeutic intervention in order to improve the following deficits and impairments:  Abnormal gait, Decreased strength, Obesity, Decreased activity tolerance, Difficulty walking, Decreased balance, Decreased mobility, Decreased coordination, Increased muscle spasms, Postural dysfunction, Hypomobility, Decreased range of motion, Decreased endurance  Visit Diagnosis: Difficulty in walking, not elsewhere classified     Problem List Patient Active Problem List   Diagnosis Date Noted  . Heart palpitations 01/06/2018  . Cystocele with rectocele 10/30/2017  . Rectocele 10/30/2017  . Microcytic red blood cells 09/23/2017  . Localized edema 02/13/2017  . Hemorrhoids 05/17/2016  . Obesity (BMI 30-39.9) 04/26/2016  . Multiple lung nodules 04/19/2016  . Chest pain 04/17/2016  . Partial symptomatic epilepsy with complex partial seizures, not intractable, without status epilepticus ([place]) 06/30/2015  . Bronchiectasis without complication ([place]) 02/33/4356  . Imbalance 11/08/2014  . Essential (primary) hypertension 11/08/2014  . Personal history of malignant neoplasm of breast 11/08/2014  . History of malignant carcinoid tumor of bronchus and lung 11/08/2014  . H/O peptic ulcer 11/08/2014  . Asthma, mild intermittent 11/08/2014  . PE (pulmonary embolism) 11/08/2014  . Gonalgia 11/08/2014  . Leg varices  11/08/2014    Blythe Stanford, PT DPT 04/28/2018, 2:15 PM  [facility] [address] [place], [address] Phone: [phone]   Fax:  [phone]  Name: Rebecca Wolf MRN: [id number] Date of Birth: 04-Mar-1925

## 2018-05-03 ENCOUNTER — Other Ambulatory Visit: Payer: Self-pay | Admitting: Internal Medicine

## 2018-05-05 ENCOUNTER — Ambulatory Visit: Payer: Medicare Other | Attending: Physical Medicine and Rehabilitation

## 2018-05-05 DIAGNOSIS — M6281 Muscle weakness (generalized): Secondary | ICD-10-CM | POA: Diagnosis not present

## 2018-05-05 DIAGNOSIS — R2681 Unsteadiness on feet: Secondary | ICD-10-CM | POA: Diagnosis not present

## 2018-05-05 DIAGNOSIS — R262 Difficulty in walking, not elsewhere classified: Secondary | ICD-10-CM | POA: Diagnosis not present

## 2018-05-05 NOTE — Therapy (Signed)
[facility] [address] [place], [address] Phone: [phone]   Fax:  [phone]  Physical Therapy Treatment  Patient Details  Name: Rebecca Wolf MRN: [id number] Date of Birth: 1924-09-29 No data recorded  Encounter Date: 05/05/2018  PT End of Session - 05/05/18 1403    Visit Number  50    Number of Visits  51    Date for PT Re-Evaluation  05/05/18    Authorization Type  6/10    PT Start Time  1300    PT Stop Time  1340    PT Time Calculation (min)  40 min    Equipment Utilized During Treatment  Gait belt    Activity Tolerance  Patient tolerated treatment well    Behavior During Therapy  WFL for tasks assessed/performed       Past Medical History:  Diagnosis Date  . Asthma   . Breast cancer ([place]) 2003   LT LUMPECTOMY  . Breast cancer ([place]) 2004   LT LUMPECTOMY  . 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  . GERD (gastroesophageal reflux disease)   . Hemorrhoids   . Hypertension   . Personal history of chemotherapy 2004   BREAST CA  . Personal history of radiation therapy 2003   BREAST CA  . Personal history of radiation therapy 2004   BREAST CA  . Polyp of colon 2002   bleeding polyp of right ascending colon  . PUD (peptic ulcer disease)   . Seizures ([place])    epilepsy well controlled    Past Surgical History:  Procedure Laterality Date  . BREAST EXCISIONAL BIOPSY Left 2003   positive  . BREAST EXCISIONAL BIOPSY Left 2004   positive  . BREAST LUMPECTOMY Left 2003   BREAST CA  . BREAST LUMPECTOMY Left 2004   BREAST CA  . HEMICOLECTOMY Right    bowel obstruction  . HEMORRHOID SURGERY    . THORACOTOMY/LOBECTOMY  1999   carcinoid tumor  . TOTAL VAGINAL HYSTERECTOMY    . VARICOSE VEIN SURGERY      There were no vitals filed for this visit.  Subjective Assessment - 05/05/18 1401    Subjective  Patient reports increased fatigue with performing sit to stands she states  she walks at home every once in a while.     Pertinent History  Previously seen for balance disorders and for neck pain over the past year. Pt reports R knee feels sore today but is not painful.    Limitations  Walking;Sitting    How long can you walk comfortably?  3 min     Diagnostic tests  None    Patient Stated Goals  To be able to walk without an walker.     Currently in Pain?  No/denies         TREATMENT Therapeutic Exercise Ambulation x 161ft , x 141ftwith B UE to perform HHA with therapist Nustep with UE with  Both LE and UE to improve strengthening -- 10 min, x 3 level Sit to stands - 2 x 5 Hip abduction in standing with UE support - x 10 B Soccer kicks in standing - x 10    Patient reports increased fatigue at the end of the session    PT Education - 05/05/18 1403    Education provided  Yes    Education Details  form/technique with exercise    Person(s) Educated  Patient    Methods  Demonstration;Explanation    Comprehension  Verbalized understanding;Returned demonstration          PT Long Term Goals - 03/24/18 1459      PT LONG TERM GOAL #1   Title  Patient will be independent with HEP to continue benefits of therapy after discharge.    Baseline  Requires moderate cueing with performance of exercise; 12/02/2017: moderate cueing to complete HEP; 01/20/2018: Performs HEP with ambulation    Time  8    Period  Weeks    Status  Achieved      PT LONG TERM GOAL #2   Title  Patient will improve TUG to under 12 seconds to indicate signficant improvement in fall risk.     Baseline  TUG: 24sec; 03/26/2017: TUG: 22sec; 05/12/17: 21.7 sec; 05/14/2017: 19sec; 08/26/2016: 14.75; 10/14/2017: 18sec; 12/02/2017: 16sec; ; 03/24/2018: 17sec    Time  8    Period  Weeks    Status  On-going      PT LONG TERM GOAL #3   Title  Patient will improve 66minWT to over 106ft to indicate functional improvement in walking tolerance and greater ability to walk in stores before requiring a sitting  rest break    Baseline  431ft; 03/26/2017: 559ft; 05/12/17: 43ft (primary deficit is DOE); 07/14/17: 440ft DOE; 08/26/2017: 389ft; 10/14/2017: 473ft ; 12/02/2017: 247ft; 01/20/2018: 470ft    Time  8    Period  Weeks    Status  On-going      PT LONG TERM GOAL #4   Title  Patient will demonstrate ability to stand for over 2 min to allow for improved ability to perform household chores at home.     Baseline  able to stand for 50sec before requiring a sitting rest break; 03/26/2017: 60 sec before requiring a break; 05/12/17: 60 seconds per pt subjective report; 07/14/17: 30sec 08/26/2017: 50sec; 10/14/2017: 60sec  12/02/2017: 45sec; 01/20/2018: 38sec; 03/24/2018: 28sec    Time  8    Period  Weeks    Status  On-going      PT LONG TERM GOAL #5   Title  Pt will decrease 5TSTS to <12 seconds from a chair at 19in in height in order to demonstrate clinically significant improvement in LE strength    Baseline  5xSTS: 14 sec from 22" height chair; 03/26/2017: 12.5sec; 05/12/17: 13.4 sec; 07/14/17: Deffered; 08/26/2017: 21sec from 21" height; 10/14/2017: 19" height 19 sec; 12/02/2017: 15;  at a height of 23in; 01/20/2018: 13sec from 23in chair; 03/24/2018: 18sec from 21" chair     Time  8    Period  Weeks    Status  On-going            Plan - 05/05/18 1404    Clinical Impression Statement  Patient demonstrates increased fatigue today and is resistent to perform exercises secondary to fatigue and nasuea. Patient required CGA for ambulation with reaching for objects to maintain balance. Patient continues to have diffculty ambulating and will benefit from further skilled therapy to return to prior level of function.     Rehab Potential  Fair    Clinical Impairments Affecting Rehab Potential  Positive: motivation; Negative: age, chronicity, bilateral knee pain    PT Frequency  2x / week    PT Duration  8 weeks    PT Treatment/Interventions  ADLs/Self Care Home Management;Aquatic Therapy;Canalith  Repostioning;Cryotherapy;Electrical Stimulation;Iontophoresis 4mg /ml Dexamethasone;Moist Heat;Traction;Ultrasound;DME Instruction;Gait training;Stair training;Functional mobility training;Therapeutic activities;Therapeutic exercise;Balance training;Neuromuscular re-education;Patient/family education;Wheelchair mobility training;Manual techniques;Passive range of motion;Vestibular  PT Next Visit Plan  Progress strengthening, reinforce HEP    PT Home Exercise Plan  Cervical retraction    Consulted and Agree with Plan of Care  Patient;Family member/caregiver       Patient will benefit from skilled therapeutic intervention in order to improve the following deficits and impairments:  Abnormal gait, Decreased strength, Obesity, Decreased activity tolerance, Difficulty walking, Decreased balance, Decreased mobility, Decreased coordination, Increased muscle spasms, Postural dysfunction, Hypomobility, Decreased range of motion, Decreased endurance  Visit Diagnosis: Difficulty in walking, not elsewhere classified  Muscle weakness (generalized)  Unsteadiness on feet     Problem List Patient Active Problem List   Diagnosis Date Noted  . Heart palpitations 01/06/2018  . Cystocele with rectocele 10/30/2017  . Rectocele 10/30/2017  . Microcytic red blood cells 09/23/2017  . Localized edema 02/13/2017  . Hemorrhoids 05/17/2016  . Obesity (BMI 30-39.9) 04/26/2016  . Multiple lung nodules 04/19/2016  . Chest pain 04/17/2016  . Partial symptomatic epilepsy with complex partial seizures, not intractable, without status epilepticus ([place]) 06/30/2015  . Bronchiectasis without complication ([place]) 79/08/8331  . Imbalance 11/08/2014  . Essential (primary) hypertension 11/08/2014  . Personal history of malignant neoplasm of breast 11/08/2014  . History of malignant carcinoid tumor of bronchus and lung 11/08/2014  . H/O peptic ulcer 11/08/2014  . Asthma, mild intermittent 11/08/2014  . PE (pulmonary  embolism) 11/08/2014  . Gonalgia 11/08/2014  . Leg varices 11/08/2014    Blythe Stanford, PT DPT 05/05/2018, 2:09 PM  [facility] [address] [place], [address] Phone: [phone]   Fax:  [phone]  Name: SHALIYAH TAITE MRN: [id number] Date of Birth: 07-13-1924

## 2018-05-12 ENCOUNTER — Ambulatory Visit: Payer: Medicare Other

## 2018-05-12 DIAGNOSIS — M6281 Muscle weakness (generalized): Secondary | ICD-10-CM

## 2018-05-12 DIAGNOSIS — R262 Difficulty in walking, not elsewhere classified: Secondary | ICD-10-CM | POA: Diagnosis not present

## 2018-05-12 DIAGNOSIS — R2681 Unsteadiness on feet: Secondary | ICD-10-CM | POA: Diagnosis not present

## 2018-05-12 NOTE — Therapy (Signed)
[facility] [address] [place], [address] Phone: [phone]   Fax:  [phone]  Physical Therapy Treatment  Patient Details  Name: Rebecca Wolf MRN: [id number] Date of Birth: 09/28/1924 No data recorded  Encounter Date: 05/12/2018  PT End of Session - 05/12/18 1307    Visit Number  51    Number of Visits  65    Date for PT Re-Evaluation  06/23/18    Authorization Type  1/ 10    PT Start Time  1300    PT Stop Time  1345    PT Time Calculation (min)  45 min    Equipment Utilized During Treatment  Gait belt    Activity Tolerance  Patient tolerated treatment well    Behavior During Therapy  WFL for tasks assessed/performed       Past Medical History:  Diagnosis Date  . Asthma   . Breast cancer ([place]) 2003   LT LUMPECTOMY  . Breast cancer ([place]) 2004   LT LUMPECTOMY  . 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  . GERD (gastroesophageal reflux disease)   . Hemorrhoids   . Hypertension   . Personal history of chemotherapy 2004   BREAST CA  . Personal history of radiation therapy 2003   BREAST CA  . Personal history of radiation therapy 2004   BREAST CA  . Polyp of colon 2002   bleeding polyp of right ascending colon  . PUD (peptic ulcer disease)   . Seizures ([place])    epilepsy well controlled    Past Surgical History:  Procedure Laterality Date  . BREAST EXCISIONAL BIOPSY Left 2003   positive  . BREAST EXCISIONAL BIOPSY Left 2004   positive  . BREAST LUMPECTOMY Left 2003   BREAST CA  . BREAST LUMPECTOMY Left 2004   BREAST CA  . HEMICOLECTOMY Right    bowel obstruction  . HEMORRHOID SURGERY    . THORACOTOMY/LOBECTOMY  1999   carcinoid tumor  . TOTAL VAGINAL HYSTERECTOMY    . VARICOSE VEIN SURGERY      There were no vitals filed for this visit.  Subjective Assessment - 05/12/18 1305    Subjective  Patient reports she has been trying to ambulate [place] a [place] to improve abilty to move with less effort.     Pertinent History  Previously seen for balance disorders and for neck pain over the past year. Pt reports R knee feels sore today but is not painful.    Limitations  Walking;Sitting    How long can you walk comfortably?  3 min     Diagnostic tests  None    Patient Stated Goals  To be able to walk without an walker.     Currently in Pain?  No/denies          TREATMENT Therapeutic Exercise Ambulation x 140ft , x 137ft, x 140ftwith B UE to perform HHA with therapist Nustep with UE with  Both LE and UE to improve strengthening -- 10 min, x 4 level Sit to stands - 3 x 5 Sit to standing with ambulating 36ft x 2  Standing for time as long as possible - 3 x 30 sec    Patient reports increased fatigue at the end of the session    PT Education - 05/12/18 1306    Education provided  Yes    Education Details  form/technique with exercise; POC  Person(s) Educated  Patient    Methods  Explanation;Demonstration    Comprehension  Verbalized understanding;Returned demonstration          PT Long Term Goals - 05/12/18 1328      PT LONG TERM GOAL #1   Title  Patient will be independent with HEP to continue benefits of therapy after discharge.    Baseline  Requires moderate cueing with performance of exercise; 12/02/2017: moderate cueing to complete HEP; 01/20/2018: Performs HEP with ambulation    Time  8    Period  Weeks    Status  Achieved      PT LONG TERM GOAL #2   Title  Patient will improve TUG to under 12 seconds to indicate signficant improvement in fall risk.     Baseline  TUG: 24sec; 03/26/2017: TUG: 22sec; 05/12/17: 21.7 sec; 05/14/2017: 19sec; 08/26/2016: 14.75; 10/14/2017: 18sec; 12/02/2017: 16sec; ; 03/24/2018: 17sec; 05/12/2018: 25sec     Time  8    Period  Weeks    Status  On-going      PT LONG TERM GOAL #3   Title  Patient will improve 107minWT to over 1057ft to indicate functional improvement in walking  tolerance and greater ability to walk in stores before requiring a sitting rest break    Baseline  41ft; 03/26/2017: 516ft; 05/12/17: 454ft (primary deficit is DOE); 07/14/17: 425ft DOE; 08/26/2017: 366ft; 10/14/2017: 487ft ; 12/02/2017: 241ft; 01/20/2018: 425ft; 05/12/2018: 375ft    Time  8    Period  Weeks    Status  On-going      PT LONG TERM GOAL #4   Title  Patient will demonstrate ability to stand for over 2 min to allow for improved ability to perform household chores at home.     Baseline  able to stand for 50sec before requiring a sitting rest break; 03/26/2017: 60 sec before requiring a break; 05/12/17: 60 seconds per pt subjective report; 07/14/17: 30sec 08/26/2017: 50sec; 10/14/2017: 60sec  12/02/2017: 45sec; 01/20/2018: 38sec; 03/24/2018: 28sec; 05/12/2018: 28sec    Time  8    Period  Weeks    Status  On-going      PT LONG TERM GOAL #5   Title  Pt will decrease 5TSTS to <12 seconds from a chair at 19in in height in order to demonstrate clinically significant improvement in LE strength    Baseline  5xSTS: 14 sec from 22" height chair; 03/26/2017: 12.5sec; 05/12/17: 13.4 sec; 07/14/17: Deffered; 08/26/2017: 21sec from 21" height; 10/14/2017: 19" height 19 sec; 12/02/2017: 15;  at a height of 23in; 01/20/2018: 13sec from 23in chair; 03/24/2018: 18sec from 21" chair; 05/12/2018: 15 sec      Time  8    Period  Weeks    Status  On-going            Plan - 05/12/18 1344    Clinical Impression Statement  Reassessed goals today and patient demosntrates endurance worse than the previous reassessment however demosntrates improvement in strength. Patient comes into therapy and appears to be fatigued, however is does not self report increased level of fatigue. Patient continues to require CGA with ambulation and frequent rest breaks. Patient will benefit from further skilled therapy to return to prior level of function secondary to increased fall risk as indicated by poor testing scores.     Rehab Potential   Fair    Clinical Impairments Affecting Rehab Potential  Positive: motivation; Negative: age, chronicity, bilateral knee pain    PT Frequency  2x /  week    PT Duration  8 weeks    PT Treatment/Interventions  ADLs/Self Care Home Management;Aquatic Therapy;Canalith Repostioning;Cryotherapy;Electrical Stimulation;Iontophoresis 4mg /ml Dexamethasone;Moist Heat;Traction;Ultrasound;DME Instruction;Gait training;Stair training;Functional mobility training;Therapeutic activities;Therapeutic exercise;Balance training;Neuromuscular re-education;Patient/family education;Wheelchair mobility training;Manual techniques;Passive range of motion;Vestibular    PT Next Visit Plan  Progress strengthening, reinforce HEP    PT Home Exercise Plan  Cervical retraction    Consulted and Agree with Plan of Care  Patient;Family member/caregiver       Patient will benefit from skilled therapeutic intervention in order to improve the following deficits and impairments:  Abnormal gait, Decreased strength, Obesity, Decreased activity tolerance, Difficulty walking, Decreased balance, Decreased mobility, Decreased coordination, Increased muscle spasms, Postural dysfunction, Hypomobility, Decreased range of motion, Decreased endurance  Visit Diagnosis: Difficulty in walking, not elsewhere classified  Muscle weakness (generalized)  Unsteadiness on feet     Problem List Patient Active Problem List   Diagnosis Date Noted  . Heart palpitations 01/06/2018  . Cystocele with rectocele 10/30/2017  . Rectocele 10/30/2017  . Microcytic red blood cells 09/23/2017  . Localized edema 02/13/2017  . Hemorrhoids 05/17/2016  . Obesity (BMI 30-39.9) 04/26/2016  . Multiple lung nodules 04/19/2016  . Chest pain 04/17/2016  . Partial symptomatic epilepsy with complex partial seizures, not intractable, without status epilepticus ([place]) 06/30/2015  . Bronchiectasis without complication ([place]) 16/60/6004  . Imbalance 11/08/2014  . Essential  (primary) hypertension 11/08/2014  . Personal history of malignant neoplasm of breast 11/08/2014  . History of malignant carcinoid tumor of bronchus and lung 11/08/2014  . H/O peptic ulcer 11/08/2014  . Asthma, mild intermittent 11/08/2014  . PE (pulmonary embolism) 11/08/2014  . Gonalgia 11/08/2014  . Leg varices 11/08/2014    Blythe Stanford, PT DPT 05/12/2018, 1:47 PM  [facility] [address] [place], [address] Phone: [phone]   Fax:  [phone]  Name: FRANCEEN ERISMAN MRN: [id number] Date of Birth: 26-Feb-1925

## 2018-05-15 DIAGNOSIS — Z23 Encounter for immunization: Secondary | ICD-10-CM | POA: Diagnosis not present

## 2018-05-19 ENCOUNTER — Ambulatory Visit: Payer: Medicare Other

## 2018-05-19 ENCOUNTER — Other Ambulatory Visit: Payer: Self-pay

## 2018-05-19 ENCOUNTER — Encounter: Payer: Self-pay | Admitting: Podiatry

## 2018-05-19 ENCOUNTER — Ambulatory Visit (INDEPENDENT_AMBULATORY_CARE_PROVIDER_SITE_OTHER): Payer: Medicare Other | Admitting: Podiatry

## 2018-05-19 DIAGNOSIS — R262 Difficulty in walking, not elsewhere classified: Secondary | ICD-10-CM | POA: Diagnosis not present

## 2018-05-19 DIAGNOSIS — M79676 Pain in unspecified toe(s): Secondary | ICD-10-CM

## 2018-05-19 DIAGNOSIS — B351 Tinea unguium: Secondary | ICD-10-CM | POA: Diagnosis not present

## 2018-05-19 DIAGNOSIS — M6281 Muscle weakness (generalized): Secondary | ICD-10-CM

## 2018-05-19 DIAGNOSIS — R2681 Unsteadiness on feet: Secondary | ICD-10-CM

## 2018-05-19 NOTE — Therapy (Signed)
[facility] [address], [place], [address] Phone: [phone]   Fax:  [phone]  Physical Therapy Treatment  Patient Details  Name: Rebecca Wolf MRN: [id number] Date of Birth: 09/09/1924 No data recorded  Encounter Date: 05/19/2018  PT End of Session - 05/19/18 1318    Visit Number  52    Number of Visits  6    Date for PT Re-Evaluation  06/23/18    Authorization Type  2/ 10    PT Start Time  1300    PT Stop Time  1345    PT Time Calculation (min)  45 min    Equipment Utilized During Treatment  Gait belt    Activity Tolerance  Patient tolerated treatment well    Behavior During Therapy  WFL for tasks assessed/performed       Past Medical History:  Diagnosis Date  . Asthma   . Breast cancer ([place]) 2003   LT LUMPECTOMY  . Breast cancer ([place]) 2004   LT LUMPECTOMY  . 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  . GERD (gastroesophageal reflux disease)   . Hemorrhoids   . Hypertension   . Personal history of chemotherapy 2004   BREAST CA  . Personal history of radiation therapy 2003   BREAST CA  . Personal history of radiation therapy 2004   BREAST CA  . Polyp of colon 2002   bleeding polyp of right ascending colon  . PUD (peptic ulcer disease)   . Seizures ([place])    epilepsy well controlled    Past Surgical History:  Procedure Laterality Date  . BREAST EXCISIONAL BIOPSY Left 2003   positive  . BREAST EXCISIONAL BIOPSY Left 2004   positive  . BREAST LUMPECTOMY Left 2003   BREAST CA  . BREAST LUMPECTOMY Left 2004   BREAST CA  . HEMICOLECTOMY Right    bowel obstruction  . HEMORRHOID SURGERY    . THORACOTOMY/LOBECTOMY  1999   carcinoid tumor  . TOTAL VAGINAL HYSTERECTOMY    . VARICOSE VEIN SURGERY      There were no vitals filed for this visit.  Subjective Assessment - 05/19/18 1308    Subjective  Patient reports she has been watching her diet and trying to eat healthier.  Patient reports she has been walking around [place] to walk over the weekend    Pertinent History  Previously seen for balance disorders and for neck pain over the past year. Pt reports R knee feels sore today but is not painful.    Limitations  Walking;Sitting    How long can you walk comfortably?  3 min     Diagnostic tests  None    Patient Stated Goals  To be able to walk without an walker.     Currently in Pain?  No/denies         TREATMENT Therapeutic Exercise Ambulation x 240ft HHA with therapist with focus on improving speed Nustep with UE with Both LE and UE to improve strengthening -- 10 min, x 4 level Sit to stands - 2 x 8 Side stepping onto 6" with UE support - x 10 Throwing balls into a basket - x20 x 2 Hip abduction in standing - x 10 B  Standing [place] with therapist support - x 10    Patient reports increased fatigue at the end of the session   PT Education - 05/19/18 1317    Education provided  Yes  Education Details  form/technique with exercise;     Person(s) Educated  Patient    Methods  Explanation;Demonstration    Comprehension  Verbalized understanding;Returned demonstration          PT Long Term Goals - 05/12/18 1328      PT LONG TERM GOAL #1   Title  Patient will be independent with HEP to continue benefits of therapy after discharge.    Baseline  Requires moderate cueing with performance of exercise; 12/02/2017: moderate cueing to complete HEP; 01/20/2018: Performs HEP with ambulation    Time  8    Period  Weeks    Status  Achieved      PT LONG TERM GOAL #2   Title  Patient will improve TUG to under 12 seconds to indicate signficant improvement in fall risk.     Baseline  TUG: 24sec; 03/26/2017: TUG: 22sec; 05/12/17: 21.7 sec; 05/14/2017: 19sec; 08/26/2016: 14.75; 10/14/2017: 18sec; 12/02/2017: 16sec; ; 03/24/2018: 17sec; 05/12/2018: 25sec     Time  8    Period  Weeks    Status  On-going      PT LONG TERM GOAL #3   Title  Patient will  improve 87minWT to over 1040ft to indicate functional improvement in walking tolerance and greater ability to walk in stores before requiring a sitting rest break    Baseline  428ft; 03/26/2017: 564ft; 05/12/17: 416ft (primary deficit is DOE); 07/14/17: 455ft DOE; 08/26/2017: 336ft; 10/14/2017: 447ft ; 12/02/2017: 264ft; 01/20/2018: 411ft; 05/12/2018: 366ft    Time  8    Period  Weeks    Status  On-going      PT LONG TERM GOAL #4   Title  Patient will demonstrate ability to stand for over 2 min to allow for improved ability to perform household chores at home.     Baseline  able to stand for 50sec before requiring a sitting rest break; 03/26/2017: 60 sec before requiring a break; 05/12/17: 60 seconds per pt subjective report; 07/14/17: 30sec 08/26/2017: 50sec; 10/14/2017: 60sec  12/02/2017: 45sec; 01/20/2018: 38sec; 03/24/2018: 28sec; 05/12/2018: 28sec    Time  8    Period  Weeks    Status  On-going      PT LONG TERM GOAL #5   Title  Pt will decrease 5TSTS to <12 seconds from a chair at 19in in height in order to demonstrate clinically significant improvement in LE strength    Baseline  5xSTS: 14 sec from 22" height chair; 03/26/2017: 12.5sec; 05/12/17: 13.4 sec; 07/14/17: Deffered; 08/26/2017: 21sec from 21" height; 10/14/2017: 19" height 19 sec; 12/02/2017: 15;  at a height of 23in; 01/20/2018: 13sec from 23in chair; 03/24/2018: 18sec from 21" chair; 05/12/2018: 15 sec      Time  8    Period  Weeks    Status  On-going            Plan - 05/19/18 1334    Clinical Impression Statement  Patient demonstrates singificant improvement with exercise performance today with ability to walk for longer periods of time before onset of fatigue indicating functional carryover. Part of functional carryover most likely from patient walking more recreationally. Patient continues to have increased weakness and imbalance in standing requiring therapist support or UE support to perform all standing exercises. Patient will benefit  from further skilled therapy to return to prior level of function.     Rehab Potential  Fair    Clinical Impairments Affecting Rehab Potential  Positive: motivation; Negative: age, chronicity, bilateral knee pain  PT Frequency  2x / week    PT Duration  8 weeks    PT Treatment/Interventions  ADLs/Self Care Home Management;Aquatic Therapy;Canalith Repostioning;Cryotherapy;Electrical Stimulation;Iontophoresis 4mg /ml Dexamethasone;Moist Heat;Traction;Ultrasound;DME Instruction;Gait training;Stair training;Functional mobility training;Therapeutic activities;Therapeutic exercise;Balance training;Neuromuscular re-education;Patient/family education;Wheelchair mobility training;Manual techniques;Passive range of motion;Vestibular    PT Next Visit Plan  Progress strengthening, reinforce HEP    PT Home Exercise Plan  Cervical retraction    Consulted and Agree with Plan of Care  Patient;Family member/caregiver       Patient will benefit from skilled therapeutic intervention in order to improve the following deficits and impairments:  Abnormal gait, Decreased strength, Obesity, Decreased activity tolerance, Difficulty walking, Decreased balance, Decreased mobility, Decreased coordination, Increased muscle spasms, Postural dysfunction, Hypomobility, Decreased range of motion, Decreased endurance  Visit Diagnosis: Difficulty in walking, not elsewhere classified  Muscle weakness (generalized)  Unsteadiness on feet     Problem List Patient Active Problem List   Diagnosis Date Noted  . Heart palpitations 01/06/2018  . Cystocele with rectocele 10/30/2017  . Rectocele 10/30/2017  . Microcytic red blood cells 09/23/2017  . Localized edema 02/13/2017  . Hemorrhoids 05/17/2016  . Obesity (BMI 30-39.9) 04/26/2016  . Multiple lung nodules 04/19/2016  . Chest pain 04/17/2016  . Partial symptomatic epilepsy with complex partial seizures, not intractable, without status epilepticus ([place]) 06/30/2015  .  Bronchiectasis without complication ([place]) 59/74/1638  . Imbalance 11/08/2014  . Essential (primary) hypertension 11/08/2014  . Personal history of malignant neoplasm of breast 11/08/2014  . History of malignant carcinoid tumor of bronchus and lung 11/08/2014  . H/O peptic ulcer 11/08/2014  . Asthma, mild intermittent 11/08/2014  . PE (pulmonary embolism) 11/08/2014  . Gonalgia 11/08/2014  . Leg varices 11/08/2014    Blythe Stanford, PT DPT 05/19/2018, 1:41 PM  [facility] [address] [place], [address] Phone: [phone]   Fax:  [phone]  Name: Rebecca Wolf MRN: [id number] Date of Birth: [birth year]

## 2018-05-21 NOTE — Progress Notes (Signed)
   SUBJECTIVE Patient presents to office today complaining of elongated, thickened nails that cause pain while ambulating in shoes. She is unable to trim her own nails. Patient is here for further evaluation and treatment.  Past Medical History:  Diagnosis Date  . Asthma   . Breast cancer ([place]) 2003   LT LUMPECTOMY  . Breast cancer ([place]) 2004   LT LUMPECTOMY  . 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  . GERD (gastroesophageal reflux disease)   . Hemorrhoids   . Hypertension   . Personal history of chemotherapy 2004   BREAST CA  . Personal history of radiation therapy 2003   BREAST CA  . Personal history of radiation therapy 2004   BREAST CA  . Polyp of colon 2002   bleeding polyp of right ascending colon  . PUD (peptic ulcer disease)   . Seizures ([place])    epilepsy well controlled    OBJECTIVE General Patient is awake, alert, and oriented x 3 and in no acute distress. Derm Skin is dry and supple bilateral. Negative open lesions or macerations. Remaining integument unremarkable. Nails are tender, long, thickened and dystrophic with subungual debris, consistent with onychomycosis, 1-5 bilateral. No signs of infection noted. Vasc  DP and PT pedal pulses palpable bilaterally. Temperature gradient within normal limits.  Neuro Epicritic and protective threshold sensation grossly intact bilaterally.  Musculoskeletal Exam No symptomatic pedal deformities noted bilateral. Muscular strength within normal limits.  ASSESSMENT 1. Onychodystrophic nails 1-5 bilateral with hyperkeratosis of nails.  2. Onychomycosis of nail due to dermatophyte bilateral 3. Pain in foot bilateral  PLAN OF CARE 1. Patient evaluated today.  2. Instructed to maintain good pedal hygiene and foot care.  3. Mechanical debridement of nails 1-5 bilaterally performed using a nail nipper. Filed with dremel without incident.  4. Return to clinic in 3 mos.    Edrick Kins, DPM [facility]  Dr. Edrick Kins, [place]                                        [place], [place] [address]                Office [phone]  Fax [phone]

## 2018-05-22 DIAGNOSIS — R569 Unspecified convulsions: Secondary | ICD-10-CM | POA: Diagnosis not present

## 2018-05-26 ENCOUNTER — Ambulatory Visit: Payer: Medicare Other

## 2018-05-28 ENCOUNTER — Other Ambulatory Visit: Payer: Self-pay | Admitting: Internal Medicine

## 2018-05-29 ENCOUNTER — Other Ambulatory Visit: Payer: Self-pay | Admitting: Internal Medicine

## 2018-06-02 ENCOUNTER — Ambulatory Visit: Payer: Medicare Other | Attending: Physical Medicine and Rehabilitation | Admitting: Physical Therapy

## 2018-06-02 DIAGNOSIS — M6281 Muscle weakness (generalized): Secondary | ICD-10-CM | POA: Insufficient documentation

## 2018-06-02 DIAGNOSIS — R2681 Unsteadiness on feet: Secondary | ICD-10-CM | POA: Diagnosis not present

## 2018-06-02 DIAGNOSIS — R278 Other lack of coordination: Secondary | ICD-10-CM

## 2018-06-02 DIAGNOSIS — R262 Difficulty in walking, not elsewhere classified: Secondary | ICD-10-CM | POA: Insufficient documentation

## 2018-06-02 DIAGNOSIS — Z9181 History of falling: Secondary | ICD-10-CM | POA: Insufficient documentation

## 2018-06-02 NOTE — Therapy (Signed)
[facility] [address] [place], [address] Phone: [phone]   Fax:  [phone]  Physical Therapy Treatment  Patient Details  Name: Rebecca Wolf MRN: [id number] Date of Birth: 04/25/1925 No data recorded  Encounter Date: 06/02/2018  PT End of Session - 06/02/18 1306    Visit Number  43    Number of Visits  32    Date for PT Re-Evaluation  06/23/18    Authorization Type  3/ 10    PT Start Time  9357    PT Stop Time  1345    PT Time Calculation (min)  40 min    Equipment Utilized During Treatment  Gait belt    Activity Tolerance  Patient tolerated treatment well    Behavior During Therapy  WFL for tasks assessed/performed       Past Medical History:  Diagnosis Date  . Asthma   . Breast cancer ([facility]) 2003   LT LUMPECTOMY  . Breast cancer ([facility]) 2004   LT LUMPECTOMY  . 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  . GERD (gastroesophageal reflux disease)   . Hemorrhoids   . Hypertension   . Personal history of chemotherapy 2004   BREAST CA  . Personal history of radiation therapy 2003   BREAST CA  . Personal history of radiation therapy 2004   BREAST CA  . Polyp of colon 2002   bleeding polyp of right ascending colon  . PUD (peptic ulcer disease)   . Seizures ([facility])    epilepsy well controlled    Past Surgical History:  Procedure Laterality Date  . BREAST EXCISIONAL BIOPSY Left 2003   positive  . BREAST EXCISIONAL BIOPSY Left 2004   positive  . BREAST LUMPECTOMY Left 2003   BREAST CA  . BREAST LUMPECTOMY Left 2004   BREAST CA  . HEMICOLECTOMY Right    bowel obstruction  . HEMORRHOID SURGERY    . THORACOTOMY/LOBECTOMY  1999   carcinoid tumor  . TOTAL VAGINAL HYSTERECTOMY    . VARICOSE VEIN SURGERY      There were no vitals filed for this visit.  Subjective Assessment - 06/02/18 1305    Subjective  Patient states that she is feeling better from being sick last week. Patient  denies any falls or injuries since her last visit and has no specific concerns or questions at this time.    Pertinent History  Previously seen for balance disorders and for neck pain over the past year. Pt reports R knee feels sore today but is not painful.    Limitations  Walking;Sitting    How long can you walk comfortably?  3 min     Diagnostic tests  None    Patient Stated Goals  To be able to walk without an walker.     Currently in Pain?  No/denies       TREATMENT Therapeutic Exercise: Nustep with UE with Both LE and UE to improve strengthening -- 10 min, x 4 level; SPM 20-30 Sit to stands - 2 x 8 Side stepping onto 6" with UE support - x 10 Hip abduction in standing - x 10 B      PT Education - 06/02/18 1311    Education provided  Yes    Education Details  exercise technique, balance strategies    Person(s) Educated  Patient    Methods  Explanation;Demonstration;Verbal cues    Comprehension  Verbalized understanding;Need further instruction;Returned demonstration  PT Long Term Goals - 05/12/18 1328      PT LONG TERM GOAL #1   Title  Patient will be independent with HEP to continue benefits of therapy after discharge.    Baseline  Requires moderate cueing with performance of exercise; 12/02/2017: moderate cueing to complete HEP; 01/20/2018: Performs HEP with ambulation    Time  8    Period  Weeks    Status  Achieved      PT LONG TERM GOAL #2   Title  Patient will improve TUG to under 12 seconds to indicate signficant improvement in fall risk.     Baseline  TUG: 24sec; 03/26/2017: TUG: 22sec; 05/12/17: 21.7 sec; 05/14/2017: 19sec; 08/26/2016: 14.75; 10/14/2017: 18sec; 12/02/2017: 16sec; ; 03/24/2018: 17sec; 05/12/2018: 25sec     Time  8    Period  Weeks    Status  On-going      PT LONG TERM GOAL #3   Title  Patient will improve 68minWT to over 1066ft to indicate functional improvement in walking tolerance and greater ability to walk in stores before requiring a  sitting rest break    Baseline  455ft; 03/26/2017: 571ft; 05/12/17: 428ft (primary deficit is DOE); 07/14/17: 465ft DOE; 08/26/2017: 354ft; 10/14/2017: 452ft ; 12/02/2017: 281ft; 01/20/2018: 464ft; 05/12/2018: 3100ft    Time  8    Period  Weeks    Status  On-going      PT LONG TERM GOAL #4   Title  Patient will demonstrate ability to stand for over 2 min to allow for improved ability to perform household chores at home.     Baseline  able to stand for 50sec before requiring a sitting rest break; 03/26/2017: 60 sec before requiring a break; 05/12/17: 60 seconds per pt subjective report; 07/14/17: 30sec 08/26/2017: 50sec; 10/14/2017: 60sec  12/02/2017: 45sec; 01/20/2018: 38sec; 03/24/2018: 28sec; 05/12/2018: 28sec    Time  8    Period  Weeks    Status  On-going      PT LONG TERM GOAL #5   Title  Pt will decrease 5TSTS to <12 seconds from a chair at 19in in height in order to demonstrate clinically significant improvement in LE strength    Baseline  5xSTS: 14 sec from 22" height chair; 03/26/2017: 12.5sec; 05/12/17: 13.4 sec; 07/14/17: Deffered; 08/26/2017: 21sec from 21" height; 10/14/2017: 19" height 19 sec; 12/02/2017: 15;  at a height of 23in; 01/20/2018: 13sec from 23in chair; 03/24/2018: 18sec from 21" chair; 05/12/2018: 15 sec      Time  8    Period  Weeks    Status  On-going            Plan - 06/02/18 1319    Clinical Impression Statement  Patient demonstrates increased fatigue during today's session and required increased seated rest breaks. Patient able to perform standing LE strengthening exercises with increased reliance on BUE support. Patient will continue to benefit from skilled therapeutic intervention in order to address deficits in strength and balance for improved function and QOL.    Rehab Potential  Fair    Clinical Impairments Affecting Rehab Potential  Positive: motivation; Negative: age, chronicity, bilateral knee pain    PT Frequency  2x / week    PT Duration  8 weeks    PT  Treatment/Interventions  ADLs/Self Care Home Management;Aquatic Therapy;Canalith Repostioning;Cryotherapy;Electrical Stimulation;Iontophoresis 4mg /ml Dexamethasone;Moist Heat;Traction;Ultrasound;DME Instruction;Gait training;Stair training;Functional mobility training;Therapeutic activities;Therapeutic exercise;Balance training;Neuromuscular re-education;Patient/family education;Wheelchair mobility training;Manual techniques;Passive range of motion;Vestibular    PT Next Visit Plan  Progress  strengthening, reinforce HEP    PT Home Exercise Plan  Cervical retraction    Consulted and Agree with Plan of Care  Patient;Family member/caregiver       Patient will benefit from skilled therapeutic intervention in order to improve the following deficits and impairments:  Abnormal gait, Decreased strength, Obesity, Decreased activity tolerance, Difficulty walking, Decreased balance, Decreased mobility, Decreased coordination, Increased muscle spasms, Postural dysfunction, Hypomobility, Decreased range of motion, Decreased endurance  Visit Diagnosis: Difficulty in walking, not elsewhere classified  Muscle weakness (generalized)  Unsteadiness on feet  History of falling  Other lack of coordination     Problem List Patient Active Problem List   Diagnosis Date Noted  . Heart palpitations 01/06/2018  . Cystocele with rectocele 10/30/2017  . Rectocele 10/30/2017  . Microcytic red blood cells 09/23/2017  . Localized edema 02/13/2017  . Hemorrhoids 05/17/2016  . Obesity (BMI 30-39.9) 04/26/2016  . Multiple lung nodules 04/19/2016  . Chest pain 04/17/2016  . Partial symptomatic epilepsy with complex partial seizures, not intractable, without status epilepticus ([facility]) 06/30/2015  . Bronchiectasis without complication ([facility]) 79/89/2119  . Imbalance 11/08/2014  . Essential (primary) hypertension 11/08/2014  . Personal history of malignant neoplasm of breast 11/08/2014  . History of malignant carcinoid  tumor of bronchus and lung 11/08/2014  . H/O peptic ulcer 11/08/2014  . Asthma, mild intermittent 11/08/2014  . PE (pulmonary embolism) 11/08/2014  . Gonalgia 11/08/2014  . Leg varices 11/08/2014   Myles Gip PT, DPT [id number] 06/02/2018, 4:29 PM  [facility] [address] [place], [address] Phone: [phone]   Fax:  [phone]  Name: ADISYNN SULEIMAN MRN: [id number] Date of Birth: 1925/06/21

## 2018-06-03 DIAGNOSIS — H90A31 Mixed conductive and sensorineural hearing loss, unilateral, right ear with restricted hearing on the contralateral side: Secondary | ICD-10-CM | POA: Diagnosis not present

## 2018-06-04 DIAGNOSIS — R569 Unspecified convulsions: Secondary | ICD-10-CM | POA: Diagnosis not present

## 2018-06-18 DIAGNOSIS — R198 Other specified symptoms and signs involving the digestive system and abdomen: Secondary | ICD-10-CM | POA: Diagnosis not present

## 2018-06-18 DIAGNOSIS — R1084 Generalized abdominal pain: Secondary | ICD-10-CM | POA: Diagnosis not present

## 2018-06-18 DIAGNOSIS — Z9181 History of falling: Secondary | ICD-10-CM | POA: Diagnosis not present

## 2018-06-18 DIAGNOSIS — G40209 Localization-related (focal) (partial) symptomatic epilepsy and epileptic syndromes with complex partial seizures, not intractable, without status epilepticus: Secondary | ICD-10-CM | POA: Diagnosis not present

## 2018-06-18 DIAGNOSIS — H6123 Impacted cerumen, bilateral: Secondary | ICD-10-CM | POA: Diagnosis not present

## 2018-06-18 DIAGNOSIS — R159 Full incontinence of feces: Secondary | ICD-10-CM | POA: Diagnosis not present

## 2018-06-18 DIAGNOSIS — G629 Polyneuropathy, unspecified: Secondary | ICD-10-CM | POA: Diagnosis not present

## 2018-06-18 DIAGNOSIS — Z7901 Long term (current) use of anticoagulants: Secondary | ICD-10-CM | POA: Diagnosis not present

## 2018-06-29 ENCOUNTER — Ambulatory Visit: Payer: Medicare Other

## 2018-06-29 DIAGNOSIS — M6281 Muscle weakness (generalized): Secondary | ICD-10-CM

## 2018-06-29 DIAGNOSIS — R278 Other lack of coordination: Secondary | ICD-10-CM | POA: Diagnosis not present

## 2018-06-29 DIAGNOSIS — R2681 Unsteadiness on feet: Secondary | ICD-10-CM | POA: Diagnosis not present

## 2018-06-29 DIAGNOSIS — Z9181 History of falling: Secondary | ICD-10-CM | POA: Diagnosis not present

## 2018-06-29 DIAGNOSIS — R262 Difficulty in walking, not elsewhere classified: Secondary | ICD-10-CM

## 2018-06-29 NOTE — Addendum Note (Signed)
Addended by: Blain Pais on: 06/29/2018 02:55 PM   Modules accepted: Orders

## 2018-06-29 NOTE — Therapy (Signed)
[facility] [address], [place], [address] Phone: [phone]   Fax:  [phone]  Physical Therapy Treatment  Patient Details  Name: Rebecca Wolf MRN: [id number] Date of Birth: [birth year] No data recorded  Encounter Date: 06/29/2018  PT End of Session - 06/29/18 1406    Visit Number  54    Number of Visits  57    Date for PT Re-Evaluation  06/23/18    Authorization Type  4/ 10    PT Start Time  1350    PT Stop Time  1430    PT Time Calculation (min)  40 min    Equipment Utilized During Treatment  Gait belt    Activity Tolerance  Patient tolerated treatment well    Behavior During Therapy  WFL for tasks assessed/performed       Past Medical History:  Diagnosis Date  . Asthma   . Breast cancer ([facility]) 2003   LT LUMPECTOMY  . Breast cancer ([facility]) 2004   LT LUMPECTOMY  . 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  . GERD (gastroesophageal reflux disease)   . Hemorrhoids   . Hypertension   . Personal history of chemotherapy 2004   BREAST CA  . Personal history of radiation therapy 2003   BREAST CA  . Personal history of radiation therapy 2004   BREAST CA  . Polyp of colon 2002   bleeding polyp of right ascending colon  . PUD (peptic ulcer disease)   . Seizures ([facility])    epilepsy well controlled    Past Surgical History:  Procedure Laterality Date  . BREAST EXCISIONAL BIOPSY Left 2003   positive  . BREAST EXCISIONAL BIOPSY Left 2004   positive  . BREAST LUMPECTOMY Left 2003   BREAST CA  . BREAST LUMPECTOMY Left 2004   BREAST CA  . HEMICOLECTOMY Right    bowel obstruction  . HEMORRHOID SURGERY    . THORACOTOMY/LOBECTOMY  1999   carcinoid tumor  . TOTAL VAGINAL HYSTERECTOMY    . VARICOSE VEIN SURGERY      There were no vitals filed for this visit.  Subjective Assessment - 06/29/18 1359    Subjective  Patient states she had no major changes, states she has been walking  around the house. Denies any falls.     Pertinent History  Previously seen for balance disorders and for neck pain over the past year. Pt reports R knee feels sore today but is not painful.    Limitations  Walking;Sitting    How long can you walk comfortably?  3 min     Diagnostic tests  None    Patient Stated Goals  To be able to walk without an walker.        TREATMENT Therapeutic Exercise Ambulation x 121ft HHA with therapist with focus on improving speed Nustep with UE with Both LE and UE to improve strengthening -- 10 min, x 4 level Throwing balls into a basket - x10 x 3 Hip abduction in standing - x 10 B  Hip extension in standing - x 10 B  Heel lifts in standing -- x 10 Squats in standing -- x 10 * Deferred reexamination to next visit secondary to fatigue*   Patient reports increased fatigue at the end of the session  PT Education - 06/29/18 1402    Education provided  Yes    Education Details  form/technique with exercise.    Person(s) Educated  Patient    Methods  Explanation;Demonstration    Comprehension  Verbalized understanding;Returned demonstration          PT Long Term Goals - 05/12/18 1328      PT LONG TERM GOAL #1   Title  Patient will be independent with HEP to continue benefits of therapy after discharge.    Baseline  Requires moderate cueing with performance of exercise; 12/02/2017: moderate cueing to complete HEP; 01/20/2018: Performs HEP with ambulation    Time  8    Period  Weeks    Status  Achieved      PT LONG TERM GOAL #2   Title  Patient will improve TUG to under 12 seconds to indicate signficant improvement in fall risk.     Baseline  TUG: 24sec; 03/26/2017: TUG: 22sec; 05/12/17: 21.7 sec; 05/14/2017: 19sec; 08/26/2016: 14.75; 10/14/2017: 18sec; 12/02/2017: 16sec; ; 03/24/2018: 17sec; 05/12/2018: 25sec; 06/29/2018: Deferred until NV secondary to fatigue     Time  8    Period  Weeks    Status  On-going      PT LONG TERM GOAL #3   Title  Patient  will improve 53minWT to over 1070ft to indicate functional improvement in walking tolerance and greater ability to walk in stores before requiring a sitting rest break    Baseline  466ft; 03/26/2017: 566ft; 05/12/17: 459ft (primary deficit is DOE); 07/14/17: 477ft DOE; 08/26/2017: 32ft; 10/14/2017: 424ft ; 12/02/2017: 233ft; 01/20/2018: 462ft; 05/12/2018: 395ft; 06/29/2018: Deferred until NV secondary to fatigue      Time  8    Period  Weeks    Status  On-going      PT LONG TERM GOAL #4   Title  Patient will demonstrate ability to stand for over 2 min to allow for improved ability to perform household chores at home.     Baseline  able to stand for 50sec before requiring a sitting rest break; 03/26/2017: 60 sec before requiring a break; 05/12/17: 60 seconds per pt subjective report; 07/14/17: 30sec 08/26/2017: 50sec; 10/14/2017: 60sec  12/02/2017: 45sec; 01/20/2018: 38sec; 03/24/2018: 28sec; 05/12/2018: 28sec; 06/29/2018: Deferred until NV secondary to fatigue     Time  8    Period  Weeks    Status  On-going      PT LONG TERM GOAL #5   Title  Pt will decrease 5TSTS to <12 seconds from a chair at 19in in height in order to demonstrate clinically significant improvement in LE strength    Baseline  5xSTS: 14 sec from 22" height chair; 03/26/2017: 12.5sec; 05/12/17: 13.4 sec; 07/14/17: Deffered; 08/26/2017: 21sec from 21" height; 10/14/2017: 19" height 19 sec; 12/02/2017: 15;  at a height of 23in; 01/20/2018: 13sec from 23in chair; 03/24/2018: 18sec from 21" chair; 05/12/2018: 15 sec; 06/29/2018: Deferred until NV secondary to fatigue      Time  8    Period  Weeks    Status  On-going            Plan - 06/29/18 1412    Clinical Impression Statement  Patient demonstrates improvement with ability to walk with less UE support compared to previous sessions. Patient demonstrates increased fatigue with exercises and fatigues quickly today especially with prolonged standing. Patient demosntrates improvement with walking  quality but increased fatigue with movement and patient will benefit from further skilled therapy to return to prior level of function.     Rehab Potential  Fair    Clinical Impairments Affecting Rehab Potential  Positive: motivation; Negative: age, chronicity,  bilateral knee pain    PT Frequency  2x / week    PT Duration  8 weeks    PT Treatment/Interventions  ADLs/Self Care Home Management;Aquatic Therapy;Canalith Repostioning;Cryotherapy;Electrical Stimulation;Iontophoresis 4mg /ml Dexamethasone;Moist Heat;Traction;Ultrasound;DME Instruction;Gait training;Stair training;Functional mobility training;Therapeutic activities;Therapeutic exercise;Balance training;Neuromuscular re-education;Patient/family education;Wheelchair mobility training;Manual techniques;Passive range of motion;Vestibular    PT Next Visit Plan  Progress strengthening, reinforce HEP    PT Home Exercise Plan  Cervical retraction    Consulted and Agree with Plan of Care  Patient;Family member/caregiver       Patient will benefit from skilled therapeutic intervention in order to improve the following deficits and impairments:  Abnormal gait, Decreased strength, Obesity, Decreased activity tolerance, Difficulty walking, Decreased balance, Decreased mobility, Decreased coordination, Increased muscle spasms, Postural dysfunction, Hypomobility, Decreased range of motion, Decreased endurance  Visit Diagnosis: Difficulty in walking, not elsewhere classified  Muscle weakness (generalized)     Problem List Patient Active Problem List   Diagnosis Date Noted  . Heart palpitations 01/06/2018  . Cystocele with rectocele 10/30/2017  . Rectocele 10/30/2017  . Microcytic red blood cells 09/23/2017  . Localized edema 02/13/2017  . Hemorrhoids 05/17/2016  . Obesity (BMI 30-39.9) 04/26/2016  . Multiple lung nodules 04/19/2016  . Chest pain 04/17/2016  . Partial symptomatic epilepsy with complex partial seizures, not intractable,  without status epilepticus ([place]) 06/30/2015  . Bronchiectasis without complication ([facility]) 75/91/6384  . Imbalance 11/08/2014  . Essential (primary) hypertension 11/08/2014  . Personal history of malignant neoplasm of breast 11/08/2014  . History of malignant carcinoid tumor of bronchus and lung 11/08/2014  . H/O peptic ulcer 11/08/2014  . Asthma, mild intermittent 11/08/2014  . PE (pulmonary embolism) 11/08/2014  . Gonalgia 11/08/2014  . Leg varices 11/08/2014    Blythe Stanford 06/29/2018, 2:21 PM  [facility] [address] [place], [address] Phone: [phone]   Fax:  [phone]  Name: AMEERA TIGUE MRN: [id number] Date of Birth: 03-27-1925

## 2018-07-13 ENCOUNTER — Ambulatory Visit: Payer: Medicare Other | Attending: Physical Medicine and Rehabilitation

## 2018-07-13 DIAGNOSIS — R262 Difficulty in walking, not elsewhere classified: Secondary | ICD-10-CM | POA: Diagnosis present

## 2018-07-13 DIAGNOSIS — M6281 Muscle weakness (generalized): Secondary | ICD-10-CM | POA: Insufficient documentation

## 2018-07-13 NOTE — Therapy (Signed)
[facility] [address] [place], [address] Phone: [phone]   Fax:  [phone]  Physical Therapy Treatment  Patient Details  Name: Rebecca Wolf MRN: [id number] Date of Birth: 1925/01/16 No data recorded  Encounter Date: 07/13/2018  PT End of Session - 07/13/18 1343    Visit Number  55    Number of Visits  28    Date for PT Re-Evaluation  09/07/18    Authorization Type  1/ 10    PT Start Time  1307    PT Stop Time  1345    PT Time Calculation (min)  38 min    Equipment Utilized During Treatment  Gait belt    Activity Tolerance  Patient tolerated treatment well    Behavior During Therapy  WFL for tasks assessed/performed       Past Medical History:  Diagnosis Date  . Asthma   . Breast cancer ([place]) 2003   LT LUMPECTOMY  . Breast cancer ([place]) 2004   LT LUMPECTOMY  . 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  . GERD (gastroesophageal reflux disease)   . Hemorrhoids   . Hypertension   . Personal history of chemotherapy 2004   BREAST CA  . Personal history of radiation therapy 2003   BREAST CA  . Personal history of radiation therapy 2004   BREAST CA  . Polyp of colon 2002   bleeding polyp of right ascending colon  . PUD (peptic ulcer disease)   . Seizures ([place])    epilepsy well controlled    Past Surgical History:  Procedure Laterality Date  . BREAST EXCISIONAL BIOPSY Left 2003   positive  . BREAST EXCISIONAL BIOPSY Left 2004   positive  . BREAST LUMPECTOMY Left 2003   BREAST CA  . BREAST LUMPECTOMY Left 2004   BREAST CA  . HEMICOLECTOMY Right    bowel obstruction  . HEMORRHOID SURGERY    . THORACOTOMY/LOBECTOMY  1999   carcinoid tumor  . TOTAL VAGINAL HYSTERECTOMY    . VARICOSE VEIN SURGERY      There were no vitals filed for this visit.  Subjective Assessment - 07/13/18 1340    Subjective  Patient reports she has been walking everyday in which the temperture is  49degrees or up. Patient states she able to walk an average of 4 buildings length before requiring a sitting rest break.      Pertinent History  Previously seen for balance disorders and for neck pain over the past year. Pt reports R knee feels sore today but is not painful.    Limitations  Walking;Sitting    How long can you walk comfortably?  3 min     Diagnostic tests  None    Patient Stated Goals  To be able to walk without an walker.     Currently in Pain?  No/denies          TREATMENT Therapeutic Exercise  Ambulation x 556ft with Rollatortherapist with focus on improving speed Nustep with UE with Both LE and UE to improve strengthening -- 7 min, x 3 level Standing time for LE endurance - 2 x 50sec Sit to stands with time - 2 x 5 Standing to walking with TUG - x2 with 85ft   Performed exercises in standing to challenge LE endurance and improve balance in standing as patient continues to demonstrate      PT Education - 07/13/18 1323    Education  provided  Yes    Education Details  form/technique with exercise    Person(s) Educated  Patient    Methods  Explanation;Demonstration    Comprehension  Verbalized understanding;Returned demonstration          PT Long Term Goals - 07/13/18 1327      PT LONG TERM GOAL #1   Title  Patient will be independent with HEP to continue benefits of therapy after discharge.    Baseline  Requires moderate cueing with performance of exercise; 12/02/2017: moderate cueing to complete HEP; 01/20/2018: Performs HEP with ambulation    Time  8    Period  Weeks    Status  Achieved      PT LONG TERM GOAL #2   Title  Patient will improve TUG to under 12 seconds to indicate signficant improvement in fall risk.     Baseline  TUG: 24sec; 03/26/2017: TUG: 22sec; 05/12/17: 21.7 sec; 05/14/2017: 19sec; 08/26/2016: 14.75; 10/14/2017: 18sec; 12/02/2017: 16sec; ; 03/24/2018: 17sec; 05/12/2018: 25sec; 07/13/2018: 18sec     Time  8    Period  Weeks    Status   On-going      PT LONG TERM GOAL #3   Title  Patient will improve 23minWT to over 1061ft to indicate functional improvement in walking tolerance and greater ability to walk in stores before requiring a sitting rest break    Baseline  436ft; 03/26/2017: 531ft; 05/12/17: 435ft (primary deficit is DOE); 07/14/17: 498ft DOE; 08/26/2017: 314ft; 10/14/2017: 436ft ; 12/02/2017: 250ft; 01/20/2018: 426ft; 05/12/2018: 366ft; 07/13/2018: 546ft with rollator     Time  8    Period  Weeks    Status  On-going      PT LONG TERM GOAL #4   Title  Patient will demonstrate ability to stand for over 2 min to allow for improved ability to perform household chores at home.     Baseline  able to stand for 50sec before requiring a sitting rest break; 03/26/2017: 60 sec before requiring a break; 05/12/17: 60 seconds per pt subjective report; 07/14/17: 30sec 08/26/2017: 50sec; 10/14/2017: 60sec  12/02/2017: 45sec; 01/20/2018: 38sec; 03/24/2018: 28sec; 05/12/2018: 28sec; 07/13/2018: 57sec    Time  8    Period  Weeks    Status  On-going      PT LONG TERM GOAL #5   Title  Pt will decrease 5TSTS to <12 seconds from a chair at 19in in height in order to demonstrate clinically significant improvement in LE strength    Baseline  5xSTS: 14 sec from 22" height chair; 03/26/2017: 12.5sec; 05/12/17: 13.4 sec; 07/14/17: Deffered; 08/26/2017: 21sec from 21" height; 10/14/2017: 19" height 19 sec; 12/02/2017: 15;  at a height of 23in; 01/20/2018: 13sec from 23in chair; 03/24/2018: 18sec from 21" chair; 05/12/2018: 15 sec; 07/13/2018: 14.5sec from 19" chair with use of hands      Time  8    Period  Weeks    Status  On-going            Plan - 07/13/18 1345    Clinical Impression Statement  Patient demonstrates improvement towards long term goals with improvement with 99minWT, TUG and total standing without UE support indicating functional carryover and improvement with exercises. Although patient is improving, she continues to have significant DOE and  requires use of UE to perform exercises. Patient demonstrates overall decrease in strength and will benefit from further skilled therapy to return to prior level of function.    Rehab Potential  Fair  Clinical Impairments Affecting Rehab Potential  Positive: motivation; Negative: age, chronicity, bilateral knee pain    PT Frequency  2x / week    PT Duration  8 weeks    PT Treatment/Interventions  ADLs/Self Care Home Management;Aquatic Therapy;Canalith Repostioning;Cryotherapy;Electrical Stimulation;Iontophoresis 4mg /ml Dexamethasone;Moist Heat;Traction;Ultrasound;DME Instruction;Gait training;Stair training;Functional mobility training;Therapeutic activities;Therapeutic exercise;Balance training;Neuromuscular re-education;Patient/family education;Wheelchair mobility training;Manual techniques;Passive range of motion;Vestibular    PT Next Visit Plan  Progress strengthening, reinforce HEP    PT Home Exercise Plan  Cervical retraction    Consulted and Agree with Plan of Care  Patient;Family member/caregiver       Patient will benefit from skilled therapeutic intervention in order to improve the following deficits and impairments:  Abnormal gait, Decreased strength, Obesity, Decreased activity tolerance, Difficulty walking, Decreased balance, Decreased mobility, Decreased coordination, Increased muscle spasms, Postural dysfunction, Hypomobility, Decreased range of motion, Decreased endurance  Visit Diagnosis: Difficulty in walking, not elsewhere classified  Muscle weakness (generalized)     Problem List Patient Active Problem List   Diagnosis Date Noted  . Heart palpitations 01/06/2018  . Cystocele with rectocele 10/30/2017  . Rectocele 10/30/2017  . Microcytic red blood cells 09/23/2017  . Localized edema 02/13/2017  . Hemorrhoids 05/17/2016  . Obesity (BMI 30-39.9) 04/26/2016  . Multiple lung nodules 04/19/2016  . Chest pain 04/17/2016  . Partial symptomatic epilepsy with complex  partial seizures, not intractable, without status epilepticus ([place]) 06/30/2015  . Bronchiectasis without complication ([place]) 71/11/2692  . Imbalance 11/08/2014  . Essential (primary) hypertension 11/08/2014  . Personal history of malignant neoplasm of breast 11/08/2014  . History of malignant carcinoid tumor of bronchus and lung 11/08/2014  . H/O peptic ulcer 11/08/2014  . Asthma, mild intermittent 11/08/2014  . PE (pulmonary embolism) 11/08/2014  . Gonalgia 11/08/2014  . Leg varices 11/08/2014    Blythe Stanford, PT DPT 07/13/2018, 1:54 PM  [facility] [address], [place], [address] Phone: [phone]   Fax:  [phone]  Name: CATHYE KREITER MRN: [id number] Date of Birth: 1925-06-23

## 2018-07-16 DIAGNOSIS — M8580 Other specified disorders of bone density and structure, unspecified site: Secondary | ICD-10-CM | POA: Insufficient documentation

## 2018-07-23 DIAGNOSIS — D472 Monoclonal gammopathy: Secondary | ICD-10-CM | POA: Insufficient documentation

## 2018-07-29 ENCOUNTER — Ambulatory Visit: Payer: Medicare Other

## 2018-07-29 DIAGNOSIS — R262 Difficulty in walking, not elsewhere classified: Secondary | ICD-10-CM | POA: Diagnosis not present

## 2018-07-29 DIAGNOSIS — M6281 Muscle weakness (generalized): Secondary | ICD-10-CM

## 2018-07-29 NOTE — Therapy (Signed)
[facility] [address], [place], [address] Phone: [phone]   Fax:  [phone]  Physical Therapy Treatment  Patient Details  Name: Rebecca Wolf MRN: [id number] Date of Birth: 05-05-1925 No data recorded  Encounter Date: 07/29/2018  PT End of Session - 07/29/18 0927    Visit Number  49    Number of Visits  22    Date for PT Re-Evaluation  09/07/18    Authorization Type  2/ 10    PT Start Time  0919    PT Stop Time  1000    PT Time Calculation (min)  41 min    Equipment Utilized During Treatment  Gait belt    Activity Tolerance  Patient tolerated treatment well    Behavior During Therapy  WFL for tasks assessed/performed       Past Medical History:  Diagnosis Date  . Asthma   . Breast cancer ([place]) 2003   LT LUMPECTOMY  . Breast cancer ([place]) 2004   LT LUMPECTOMY  . 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  . GERD (gastroesophageal reflux disease)   . Hemorrhoids   . Hypertension   . Personal history of chemotherapy 2004   BREAST CA  . Personal history of radiation therapy 2003   BREAST CA  . Personal history of radiation therapy 2004   BREAST CA  . Polyp of colon 2002   bleeding polyp of right ascending colon  . PUD (peptic ulcer disease)   . Seizures ([place])    epilepsy well controlled    Past Surgical History:  Procedure Laterality Date  . BREAST EXCISIONAL BIOPSY Left 2003   positive  . BREAST EXCISIONAL BIOPSY Left 2004   positive  . BREAST LUMPECTOMY Left 2003   BREAST CA  . BREAST LUMPECTOMY Left 2004   BREAST CA  . HEMICOLECTOMY Right    bowel obstruction  . HEMORRHOID SURGERY    . THORACOTOMY/LOBECTOMY  1999   carcinoid tumor  . TOTAL VAGINAL HYSTERECTOMY    . VARICOSE VEIN SURGERY      There were no vitals filed for this visit.  Subjective Assessment - 07/29/18 0924    Subjective  Patient reports she has been continuing to walk everyday and states no major  changes since the previous session. Patient states improvement in walking.     Pertinent History  Previously seen for balance disorders and for neck pain over the past year. Pt reports R knee feels sore today but is not painful.    Limitations  Walking;Sitting    How long can you walk comfortably?  3 min     Diagnostic tests  None    Patient Stated Goals  To be able to walk without an walker.     Currently in Pain?  No/denies       TREATMENT Therapeutic Exercise   Ambulation x 259ft with Rollator therapist with focus on improving speed Nustep with UE with Both LE and UE to improve strengthening -- 10 min, x 4 level Side stepping up and down 6" step - x 10  Step over hurdles - 2 x 5 Sit to stands with time - 2 x 5 Side stepping over hurdles with UE support - x 10  Standing time for LE endurance - 20 ball tosses in standing without support  Standing marches with UE support from therapist - x 20   Performed exercises in standing to challenge LE endurance and  improve balance in standing as patient continues to demonstrate decreased difficulty with walking and standing  PT Education - 07/29/18 0926    Education provided  Yes    Education Details  form/technique with exercise    Person(s) Educated  Patient    Methods  Explanation;Demonstration    Comprehension  Verbalized understanding;Returned demonstration          PT Long Term Goals - 07/13/18 1327      PT LONG TERM GOAL #1   Title  Patient will be independent with HEP to continue benefits of therapy after discharge.    Baseline  Requires moderate cueing with performance of exercise; 12/02/2017: moderate cueing to complete HEP; 01/20/2018: Performs HEP with ambulation    Time  8    Period  Weeks    Status  Achieved      PT LONG TERM GOAL #2   Title  Patient will improve TUG to under 12 seconds to indicate signficant improvement in fall risk.     Baseline  TUG: 24sec; 03/26/2017: TUG: 22sec; 05/12/17: 21.7 sec; 05/14/2017:  19sec; 08/26/2016: 14.75; 10/14/2017: 18sec; 12/02/2017: 16sec; ; 03/24/2018: 17sec; 05/12/2018: 25sec; 07/13/2018: 18sec     Time  8    Period  Weeks    Status  On-going      PT LONG TERM GOAL #3   Title  Patient will improve 39minWT to over 1079ft to indicate functional improvement in walking tolerance and greater ability to walk in stores before requiring a sitting rest break    Baseline  474ft; 03/26/2017: 553ft; 05/12/17: 475ft (primary deficit is DOE); 07/14/17: 482ft DOE; 08/26/2017: 31ft; 10/14/2017: 462ft ; 12/02/2017: 269ft; 01/20/2018: 449ft; 05/12/2018: 38ft; 07/13/2018: 552ft with rollator     Time  8    Period  Weeks    Status  On-going      PT LONG TERM GOAL #4   Title  Patient will demonstrate ability to stand for over 2 min to allow for improved ability to perform household chores at home.     Baseline  able to stand for 50sec before requiring a sitting rest break; 03/26/2017: 60 sec before requiring a break; 05/12/17: 60 seconds per pt subjective report; 07/14/17: 30sec 08/26/2017: 50sec; 10/14/2017: 60sec  12/02/2017: 45sec; 01/20/2018: 38sec; 03/24/2018: 28sec; 05/12/2018: 28sec; 07/13/2018: 57sec    Time  8    Period  Weeks    Status  On-going      PT LONG TERM GOAL #5   Title  Pt will decrease 5TSTS to <12 seconds from a chair at 19in in height in order to demonstrate clinically significant improvement in LE strength    Baseline  5xSTS: 14 sec from 22" height chair; 03/26/2017: 12.5sec; 05/12/17: 13.4 sec; 07/14/17: Deffered; 08/26/2017: 21sec from 21" height; 10/14/2017: 19" height 19 sec; 12/02/2017: 15;  at a height of 23in; 01/20/2018: 13sec from 23in chair; 03/24/2018: 18sec from 21" chair; 05/12/2018: 15 sec; 07/13/2018: 14.5sec from 19" chair with use of hands      Time  8    Period  Weeks    Status  On-going            Plan - 07/29/18 1002    Clinical Impression Statement  Patient demonstrates increased fatgiue today with activities with patient attributes to performing exercises in  the morning in which she is unable to do. Patient demonstrates decreased stepping clearance with ambulation and addressed this limitation with hurdles stepping and standing marches. Patient performed improvement with stepping after performance.  Patient will benefit from further skilled therapy to return to prior level of function.     Rehab Potential  Fair    Clinical Impairments Affecting Rehab Potential  Positive: motivation; Negative: age, chronicity, bilateral knee pain    PT Frequency  2x / week    PT Duration  8 weeks    PT Treatment/Interventions  ADLs/Self Care Home Management;Aquatic Therapy;Canalith Repostioning;Cryotherapy;Electrical Stimulation;Iontophoresis 4mg /ml Dexamethasone;Moist Heat;Traction;Ultrasound;DME Instruction;Gait training;Stair training;Functional mobility training;Therapeutic activities;Therapeutic exercise;Balance training;Neuromuscular re-education;Patient/family education;Wheelchair mobility training;Manual techniques;Passive range of motion;Vestibular    PT Next Visit Plan  Progress strengthening, reinforce HEP    PT Home Exercise Plan  Cervical retraction    Consulted and Agree with Plan of Care  Patient;Family member/caregiver       Patient will benefit from skilled therapeutic intervention in order to improve the following deficits and impairments:  Abnormal gait, Decreased strength, Obesity, Decreased activity tolerance, Difficulty walking, Decreased balance, Decreased mobility, Decreased coordination, Increased muscle spasms, Postural dysfunction, Hypomobility, Decreased range of motion, Decreased endurance  Visit Diagnosis: Difficulty in walking, not elsewhere classified  Muscle weakness (generalized)     Problem List Patient Active Problem List   Diagnosis Date Noted  . Heart palpitations 01/06/2018  . Cystocele with rectocele 10/30/2017  . Rectocele 10/30/2017  . Microcytic red blood cells 09/23/2017  . Localized edema 02/13/2017  . Hemorrhoids  05/17/2016  . Obesity (BMI 30-39.9) 04/26/2016  . Multiple lung nodules 04/19/2016  . Chest pain 04/17/2016  . Partial symptomatic epilepsy with complex partial seizures, not intractable, without status epilepticus ([place]) 06/30/2015  . Bronchiectasis without complication ([place]) 03/50/0938  . Imbalance 11/08/2014  . Essential (primary) hypertension 11/08/2014  . Personal history of malignant neoplasm of breast 11/08/2014  . History of malignant carcinoid tumor of bronchus and lung 11/08/2014  . H/O peptic ulcer 11/08/2014  . Asthma, mild intermittent 11/08/2014  . PE (pulmonary embolism) 11/08/2014  . Gonalgia 11/08/2014  . Leg varices 11/08/2014    Blythe Stanford, PT DPT 07/29/2018, 10:11 AM  [facility] [address] [place], [address] Phone: [phone]   Fax:  [phone]  Name: Rebecca Wolf MRN: [id number] Date of Birth: 23-Jul-1924

## 2018-08-04 ENCOUNTER — Ambulatory Visit: Payer: Medicare Other | Attending: Physical Medicine and Rehabilitation

## 2018-08-04 DIAGNOSIS — M6281 Muscle weakness (generalized): Secondary | ICD-10-CM | POA: Diagnosis present

## 2018-08-04 DIAGNOSIS — R2681 Unsteadiness on feet: Secondary | ICD-10-CM | POA: Diagnosis present

## 2018-08-04 DIAGNOSIS — R262 Difficulty in walking, not elsewhere classified: Secondary | ICD-10-CM | POA: Insufficient documentation

## 2018-08-04 NOTE — Therapy (Signed)
[facility] [address], [place], [address] Phone: [phone]   Fax:  [phone]  Physical Therapy Treatment  Patient Details  Name: Rebecca Wolf MRN: [id number] Date of Birth: September 13, 1924 No data recorded  Encounter Date: 08/04/2018  PT End of Session - 08/04/18 1405    Visit Number  62    Number of Visits  23    Date for PT Re-Evaluation  09/07/18    Authorization Type  3/ 10    PT Start Time  0867    PT Stop Time  1430    PT Time Calculation (min)  45 min    Equipment Utilized During Treatment  Gait belt    Activity Tolerance  Patient tolerated treatment well    Behavior During Therapy  WFL for tasks assessed/performed       Past Medical History:  Diagnosis Date  . Asthma   . Breast cancer ([place]) 2003   LT LUMPECTOMY  . Breast cancer ([place]) 2004   LT LUMPECTOMY  . 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  . GERD (gastroesophageal reflux disease)   . Hemorrhoids   . Hypertension   . Personal history of chemotherapy 2004   BREAST CA  . Personal history of radiation therapy 2003   BREAST CA  . Personal history of radiation therapy 2004   BREAST CA  . Polyp of colon 2002   bleeding polyp of right ascending colon  . PUD (peptic ulcer disease)   . Seizures ([place])    epilepsy well controlled    Past Surgical History:  Procedure Laterality Date  . BREAST EXCISIONAL BIOPSY Left 2003   positive  . BREAST EXCISIONAL BIOPSY Left 2004   positive  . BREAST LUMPECTOMY Left 2003   BREAST CA  . BREAST LUMPECTOMY Left 2004   BREAST CA  . HEMICOLECTOMY Right    bowel obstruction  . HEMORRHOID SURGERY    . THORACOTOMY/LOBECTOMY  1999   carcinoid tumor  . TOTAL VAGINAL HYSTERECTOMY    . VARICOSE VEIN SURGERY      There were no vitals filed for this visit.  Subjective Assessment - 08/04/18 1403    Subjective  Patient reports she has continued to walk around [place] "everyday"  and states it's becoming easier to perform.     Pertinent History  Previously seen for balance disorders and for neck pain over the past year. Pt reports R knee feels sore today but is not painful.    Limitations  Walking;Sitting    How long can you walk comfortably?  3 min     Diagnostic tests  None    Patient Stated Goals  To be able to walk without an walker.     Currently in Pain?  No/denies       TREATMENT Therapeutic Exercise   Ambulation - 2 x 224ft with Rollator therapist with focus on improving speed Nustep with UE with Both LE and UE to improve strengthening -- 4 min, x 4 level, 2 min x 6 level Sit to stands with time - x 10  Side stepping over canes - x 10  Maze walking for with 4 turns - x 58ft Hip abduction in standing with B UE support - x 10    Performed exercises in standing to challenge LE endurance and improve balance in standing as patient continues to demonstrate decreased difficulty with walking and standing  PT Education - 08/04/18 1404  Education provided  Yes    Education Details  form/technique with exercise    Person(s) Educated  Patient    Methods  Explanation;Demonstration    Comprehension  Verbalized understanding;Returned demonstration          PT Long Term Goals - 07/13/18 1327      PT LONG TERM GOAL #1   Title  Patient will be independent with HEP to continue benefits of therapy after discharge.    Baseline  Requires moderate cueing with performance of exercise; 12/02/2017: moderate cueing to complete HEP; 01/20/2018: Performs HEP with ambulation    Time  8    Period  Weeks    Status  Achieved      PT LONG TERM GOAL #2   Title  Patient will improve TUG to under 12 seconds to indicate signficant improvement in fall risk.     Baseline  TUG: 24sec; 03/26/2017: TUG: 22sec; 05/12/17: 21.7 sec; 05/14/2017: 19sec; 08/26/2016: 14.75; 10/14/2017: 18sec; 12/02/2017: 16sec; ; 03/24/2018: 17sec; 05/12/2018: 25sec; 07/13/2018: 18sec     Time  8    Period   Weeks    Status  On-going      PT LONG TERM GOAL #3   Title  Patient will improve 68minWT to over 1064ft to indicate functional improvement in walking tolerance and greater ability to walk in stores before requiring a sitting rest break    Baseline  465ft; 03/26/2017: 534ft; 05/12/17: 462ft (primary deficit is DOE); 07/14/17: 430ft DOE; 08/26/2017: 347ft; 10/14/2017: 485ft ; 12/02/2017: 226ft; 01/20/2018: 441ft; 05/12/2018: 353ft; 07/13/2018: 517ft with rollator     Time  8    Period  Weeks    Status  On-going      PT LONG TERM GOAL #4   Title  Patient will demonstrate ability to stand for over 2 min to allow for improved ability to perform household chores at home.     Baseline  able to stand for 50sec before requiring a sitting rest break; 03/26/2017: 60 sec before requiring a break; 05/12/17: 60 seconds per pt subjective report; 07/14/17: 30sec 08/26/2017: 50sec; 10/14/2017: 60sec  12/02/2017: 45sec; 01/20/2018: 38sec; 03/24/2018: 28sec; 05/12/2018: 28sec; 07/13/2018: 57sec    Time  8    Period  Weeks    Status  On-going      PT LONG TERM GOAL #5   Title  Pt will decrease 5TSTS to <12 seconds from a chair at 19in in height in order to demonstrate clinically significant improvement in LE strength    Baseline  5xSTS: 14 sec from 22" height chair; 03/26/2017: 12.5sec; 05/12/17: 13.4 sec; 07/14/17: Deffered; 08/26/2017: 21sec from 21" height; 10/14/2017: 19" height 19 sec; 12/02/2017: 15;  at a height of 23in; 01/20/2018: 13sec from 23in chair; 03/24/2018: 18sec from 21" chair; 05/12/2018: 15 sec; 07/13/2018: 14.5sec from 19" chair with use of hands      Time  8    Period  Weeks    Status  On-going            Plan - 08/04/18 1406    Clinical Impression Statement  Continued to address taking larger steps today as patient has tendency to have low foot clearance when ambulating and requires cues to take larger steps. Patient continues to require increased rest breaks throughout the session indicating poor strength  and endurance. Patient demonstrates improvement with ambulation with ability to perform greater amonut of exercises compared to previous sessions. Patient will benefit from further skilled therapy to return to prior level of function.  Clinical Presentation  Evolving    Rehab Potential  Fair    Clinical Impairments Affecting Rehab Potential  Positive: motivation; Negative: age, chronicity, bilateral knee pain    PT Frequency  2x / week    PT Duration  8 weeks    PT Treatment/Interventions  ADLs/Self Care Home Management;Aquatic Therapy;Canalith Repostioning;Cryotherapy;Electrical Stimulation;Iontophoresis 4mg /ml Dexamethasone;Moist Heat;Traction;Ultrasound;DME Instruction;Gait training;Stair training;Functional mobility training;Therapeutic activities;Therapeutic exercise;Balance training;Neuromuscular re-education;Patient/family education;Wheelchair mobility training;Manual techniques;Passive range of motion;Vestibular    PT Next Visit Plan  Progress strengthening, reinforce HEP    PT Home Exercise Plan  Cervical retraction    Consulted and Agree with Plan of Care  Patient;Family member/caregiver       Patient will benefit from skilled therapeutic intervention in order to improve the following deficits and impairments:  Abnormal gait, Decreased strength, Obesity, Decreased activity tolerance, Difficulty walking, Decreased balance, Decreased mobility, Decreased coordination, Increased muscle spasms, Postural dysfunction, Hypomobility, Decreased range of motion, Decreased endurance  Visit Diagnosis: Difficulty in walking, not elsewhere classified  Muscle weakness (generalized)     Problem List Patient Active Problem List   Diagnosis Date Noted  . Heart palpitations 01/06/2018  . Cystocele with rectocele 10/30/2017  . Rectocele 10/30/2017  . Microcytic red blood cells 09/23/2017  . Localized edema 02/13/2017  . Hemorrhoids 05/17/2016  . Obesity (BMI 30-39.9) 04/26/2016  . Multiple  lung nodules 04/19/2016  . Chest pain 04/17/2016  . Partial symptomatic epilepsy with complex partial seizures, not intractable, without status epilepticus ([place]) 06/30/2015  . Bronchiectasis without complication ([place]) 30/16/0109  . Imbalance 11/08/2014  . Essential (primary) hypertension 11/08/2014  . Personal history of malignant neoplasm of breast 11/08/2014  . History of malignant carcinoid tumor of bronchus and lung 11/08/2014  . H/O peptic ulcer 11/08/2014  . Asthma, mild intermittent 11/08/2014  . PE (pulmonary embolism) 11/08/2014  . Gonalgia 11/08/2014  . Leg varices 11/08/2014    Blythe Stanford, PT DPT 08/04/2018, 3:37 PM  [facility] [address], [place], [address] Phone: [phone]   Fax:  [phone]  Name: Rebecca Wolf MRN: [id number] Date of Birth: [birth year]

## 2018-08-11 ENCOUNTER — Ambulatory Visit: Payer: Medicare Other

## 2018-08-11 DIAGNOSIS — M6281 Muscle weakness (generalized): Secondary | ICD-10-CM

## 2018-08-11 DIAGNOSIS — R262 Difficulty in walking, not elsewhere classified: Secondary | ICD-10-CM

## 2018-08-11 DIAGNOSIS — R2681 Unsteadiness on feet: Secondary | ICD-10-CM

## 2018-08-11 NOTE — Therapy (Signed)
[facility] [address], [place], [address] Phone: [phone]   Fax:  [phone]  Physical Therapy Treatment  Patient Details  Name: Rebecca Wolf MRN: [id number] Date of Birth: 17-Feb-1925 No data recorded  Encounter Date: 08/11/2018  PT End of Session - 08/11/18 1441    Visit Number  72    Number of Visits  65    Date for PT Re-Evaluation  09/07/18    Authorization Type  4/ 10    PT Start Time  1345    PT Stop Time  1430    PT Time Calculation (min)  45 min    Equipment Utilized During Treatment  Gait belt    Activity Tolerance  Patient tolerated treatment well    Behavior During Therapy  WFL for tasks assessed/performed       Past Medical History:  Diagnosis Date  . Asthma   . Breast cancer ([facility]) 2003   LT LUMPECTOMY  . Breast cancer ([place]) 2004   LT LUMPECTOMY  . 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  . GERD (gastroesophageal reflux disease)   . Hemorrhoids   . Hypertension   . Personal history of chemotherapy 2004   BREAST CA  . Personal history of radiation therapy 2003   BREAST CA  . Personal history of radiation therapy 2004   BREAST CA  . Polyp of colon 2002   bleeding polyp of right ascending colon  . PUD (peptic ulcer disease)   . Seizures ([place])    epilepsy well controlled    Past Surgical History:  Procedure Laterality Date  . BREAST EXCISIONAL BIOPSY Left 2003   positive  . BREAST EXCISIONAL BIOPSY Left 2004   positive  . BREAST LUMPECTOMY Left 2003   BREAST CA  . BREAST LUMPECTOMY Left 2004   BREAST CA  . HEMICOLECTOMY Right    bowel obstruction  . HEMORRHOID SURGERY    . THORACOTOMY/LOBECTOMY  1999   carcinoid tumor  . TOTAL VAGINAL HYSTERECTOMY    . VARICOSE VEIN SURGERY      There were no vitals filed for this visit.  Subjective Assessment - 08/11/18 1432    Subjective  Patient reports she has continued to walk. Patient states she has  beenfeeling fatigued today and reports she might be from the weather. Patient states shes had on and off knee pain.    Pertinent History  Previously seen for balance disorders and for neck pain over the past year. Pt reports R knee feels sore today but is not painful.    Limitations  Walking;Sitting    How long can you walk comfortably?  3 min     Diagnostic tests  None    Patient Stated Goals  To be able to walk without an walker.     Currently in Pain?  No/denies       TREATMENT Therapeutic Exercise   Ambulation - 2 x 180ft with Rollator therapist with focus on improving speed and step length Nustep with UE with Both LE and UE to improve strengthening -- 6 min, x 5 level, 4 min x 3 level - lowered secondary to increased knee pain and fatigue Sit to stands with focus on decreasing time - x 10 with HHA Hip abduction in standing with B UE support - x 20  Side stepping up and down 6" step with B UE support - x 10 B   Performed exercises in standing to  challenge LE endurance/strength, improve balance, and cardiovascular endurance in standing as patient continues to demonstrate decreased difficulty with walking and standing   PT Education - 08/11/18 1441    Education provided  Yes    Education Details  form/technique with exercise    Person(s) Educated  Patient    Methods  Explanation;Demonstration    Comprehension  Verbalized understanding;Returned demonstration          PT Long Term Goals - 07/13/18 1327      PT LONG TERM GOAL #1   Title  Patient will be independent with HEP to continue benefits of therapy after discharge.    Baseline  Requires moderate cueing with performance of exercise; 12/02/2017: moderate cueing to complete HEP; 01/20/2018: Performs HEP with ambulation    Time  8    Period  Weeks    Status  Achieved      PT LONG TERM GOAL #2   Title  Patient will improve TUG to under 12 seconds to indicate signficant improvement in fall risk.     Baseline  TUG: 24sec;  03/26/2017: TUG: 22sec; 05/12/17: 21.7 sec; 05/14/2017: 19sec; 08/26/2016: 14.75; 10/14/2017: 18sec; 12/02/2017: 16sec; ; 03/24/2018: 17sec; 05/12/2018: 25sec; 07/13/2018: 18sec     Time  8    Period  Weeks    Status  On-going      PT LONG TERM GOAL #3   Title  Patient will improve 66minWT to over 1074ft to indicate functional improvement in walking tolerance and greater ability to walk in stores before requiring a sitting rest break    Baseline  466ft; 03/26/2017: 590ft; 05/12/17: 423ft (primary deficit is DOE); 07/14/17: 460ft DOE; 08/26/2017: 347ft; 10/14/2017: 477ft ; 12/02/2017: 292ft; 01/20/2018: 458ft; 05/12/2018: 363ft; 07/13/2018: 575ft with rollator     Time  8    Period  Weeks    Status  On-going      PT LONG TERM GOAL #4   Title  Patient will demonstrate ability to stand for over 2 min to allow for improved ability to perform household chores at home.     Baseline  able to stand for 50sec before requiring a sitting rest break; 03/26/2017: 60 sec before requiring a break; 05/12/17: 60 seconds per pt subjective report; 07/14/17: 30sec 08/26/2017: 50sec; 10/14/2017: 60sec  12/02/2017: 45sec; 01/20/2018: 38sec; 03/24/2018: 28sec; 05/12/2018: 28sec; 07/13/2018: 57sec    Time  8    Period  Weeks    Status  On-going      PT LONG TERM GOAL #5   Title  Pt will decrease 5TSTS to <12 seconds from a chair at 19in in height in order to demonstrate clinically significant improvement in LE strength    Baseline  5xSTS: 14 sec from 22" height chair; 03/26/2017: 12.5sec; 05/12/17: 13.4 sec; 07/14/17: Deffered; 08/26/2017: 21sec from 21" height; 10/14/2017: 19" height 19 sec; 12/02/2017: 15;  at a height of 23in; 01/20/2018: 13sec from 23in chair; 03/24/2018: 18sec from 21" chair; 05/12/2018: 15 sec; 07/13/2018: 14.5sec from 19" chair with use of hands      Time  8    Period  Weeks    Status  On-going            Plan - 08/11/18 1651    Clinical Impression Statement  Patient demonstrates increased faitgue today and is able  to walk 149ft x 2 compared to 23ft x 2 the previous sessions. Although patient demonstrates increased fatigue with these movements, she demonstrates improvement with hip abduction exercises with ability to perform  greater amount of reps (able to perform x20 vs x 10). Educated patient to perform exercises at home to further improve strength. Patient will benefit from further skilled therapy to return to prior level of function.     Rehab Potential  Fair    Clinical Impairments Affecting Rehab Potential  Positive: motivation; Negative: age, chronicity, bilateral knee pain    PT Frequency  2x / week    PT Duration  8 weeks    PT Treatment/Interventions  ADLs/Self Care Home Management;Aquatic Therapy;Canalith Repostioning;Cryotherapy;Electrical Stimulation;Iontophoresis 4mg /ml Dexamethasone;Moist Heat;Traction;Ultrasound;DME Instruction;Gait training;Stair training;Functional mobility training;Therapeutic activities;Therapeutic exercise;Balance training;Neuromuscular re-education;Patient/family education;Wheelchair mobility training;Manual techniques;Passive range of motion;Vestibular    PT Next Visit Plan  Progress strengthening, reinforce HEP    PT Home Exercise Plan  Cervical retraction    Consulted and Agree with Plan of Care  Patient;Family member/caregiver       Patient will benefit from skilled therapeutic intervention in order to improve the following deficits and impairments:  Abnormal gait, Decreased strength, Obesity, Decreased activity tolerance, Difficulty walking, Decreased balance, Decreased mobility, Decreased coordination, Increased muscle spasms, Postural dysfunction, Hypomobility, Decreased range of motion, Decreased endurance  Visit Diagnosis: Difficulty in walking, not elsewhere classified  Muscle weakness (generalized)  Unsteadiness on feet     Problem List Patient Active Problem List   Diagnosis Date Noted  . Heart palpitations 01/06/2018  . Cystocele with rectocele  10/30/2017  . Rectocele 10/30/2017  . Microcytic red blood cells 09/23/2017  . Localized edema 02/13/2017  . Hemorrhoids 05/17/2016  . Obesity (BMI 30-39.9) 04/26/2016  . Multiple lung nodules 04/19/2016  . Chest pain 04/17/2016  . Partial symptomatic epilepsy with complex partial seizures, not intractable, without status epilepticus ([place]) 06/30/2015  . Bronchiectasis without complication ([facility]) 86/76/1950  . Imbalance 11/08/2014  . Essential (primary) hypertension 11/08/2014  . Personal history of malignant neoplasm of breast 11/08/2014  . History of malignant carcinoid tumor of bronchus and lung 11/08/2014  . H/O peptic ulcer 11/08/2014  . Asthma, mild intermittent 11/08/2014  . PE (pulmonary embolism) 11/08/2014  . Gonalgia 11/08/2014  . Leg varices 11/08/2014    Blythe Stanford, PT DPT 08/11/2018, 4:56 PM  [facility] [address] [place], [address] Phone: [phone]   Fax:  [phone]  Name: IMOGEAN CIAMPA MRN: [id number] Date of Birth: July 19, 1924

## 2018-08-18 ENCOUNTER — Ambulatory Visit: Payer: Medicare Other

## 2018-08-18 ENCOUNTER — Encounter: Payer: Medicare Other | Admitting: Podiatry

## 2018-08-21 ENCOUNTER — Ambulatory Visit (INDEPENDENT_AMBULATORY_CARE_PROVIDER_SITE_OTHER): Payer: Medicare Other | Admitting: Podiatry

## 2018-08-21 ENCOUNTER — Other Ambulatory Visit: Payer: Self-pay | Admitting: Internal Medicine

## 2018-08-21 ENCOUNTER — Encounter: Payer: Self-pay | Admitting: Internal Medicine

## 2018-08-21 ENCOUNTER — Encounter: Payer: Self-pay | Admitting: Podiatry

## 2018-08-21 DIAGNOSIS — B351 Tinea unguium: Secondary | ICD-10-CM

## 2018-08-21 DIAGNOSIS — M79676 Pain in unspecified toe(s): Secondary | ICD-10-CM | POA: Diagnosis not present

## 2018-08-24 NOTE — Progress Notes (Signed)
   SUBJECTIVE Patient presents to office today complaining of elongated, thickened nails that cause pain while ambulating in shoes. She is unable to trim her own nails. Patient is here for further evaluation and treatment.  Past Medical History:  Diagnosis Date  . Asthma   . Breast cancer ([facility]) 2003   LT LUMPECTOMY  . Breast cancer ([facility]) 2004   LT LUMPECTOMY  . 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  . GERD (gastroesophageal reflux disease)   . Hemorrhoids   . Hypertension   . Personal history of chemotherapy 2004   BREAST CA  . Personal history of radiation therapy 2003   BREAST CA  . Personal history of radiation therapy 2004   BREAST CA  . Polyp of colon 2002   bleeding polyp of right ascending colon  . PUD (peptic ulcer disease)   . Seizures ([place])    epilepsy well controlled    OBJECTIVE General Patient is awake, alert, and oriented x 3 and in no acute distress. Derm Skin is dry and supple bilateral. Negative open lesions or macerations. Remaining integument unremarkable. Nails are tender, long, thickened and dystrophic with subungual debris, consistent with onychomycosis, 1-5 bilateral. No signs of infection noted. Vasc  DP and PT pedal pulses palpable bilaterally. Temperature gradient within normal limits.  Neuro Epicritic and protective threshold sensation grossly intact bilaterally.  Musculoskeletal Exam No symptomatic pedal deformities noted bilateral. Muscular strength within normal limits.  ASSESSMENT 1. Onychodystrophic nails 1-5 bilateral with hyperkeratosis of nails.  2. Onychomycosis of nail due to dermatophyte bilateral 3. Pain in foot bilateral  PLAN OF CARE 1. Patient evaluated today.  2. Instructed to maintain good pedal hygiene and foot care.  3. Mechanical debridement of nails 1-5 bilaterally performed using a nail nipper. Filed with dremel without incident.  4. Return to clinic in 3 mos.    Edrick Kins, DPM [facility]  Dr. Edrick Kins, [place]                                        [place], [place] [address]                Office [phone]  Fax [phone]

## 2018-08-25 ENCOUNTER — Ambulatory Visit: Payer: Medicare Other

## 2018-08-26 NOTE — Progress Notes (Signed)
This encounter was created in error - please disregard.

## 2018-09-03 ENCOUNTER — Ambulatory Visit: Payer: Medicare Other

## 2018-09-08 ENCOUNTER — Ambulatory Visit: Payer: Medicare Other

## 2018-09-16 ENCOUNTER — Ambulatory Visit: Payer: Medicare Other

## 2018-09-17 ENCOUNTER — Other Ambulatory Visit: Payer: Self-pay | Admitting: Internal Medicine

## 2018-09-24 ENCOUNTER — Other Ambulatory Visit: Payer: Medicare Other

## 2018-09-24 ENCOUNTER — Ambulatory Visit: Payer: Medicare Other | Admitting: Hematology and Oncology

## 2018-10-05 ENCOUNTER — Other Ambulatory Visit: Payer: Self-pay | Admitting: Internal Medicine

## 2018-10-05 DIAGNOSIS — N1832 Chronic kidney disease, stage 3b: Secondary | ICD-10-CM | POA: Insufficient documentation

## 2018-10-25 ENCOUNTER — Other Ambulatory Visit: Payer: Self-pay | Admitting: Hematology and Oncology

## 2018-10-25 DIAGNOSIS — R718 Other abnormality of red blood cells: Secondary | ICD-10-CM

## 2018-10-25 DIAGNOSIS — Z7901 Long term (current) use of anticoagulants: Secondary | ICD-10-CM | POA: Insufficient documentation

## 2018-10-25 NOTE — Progress Notes (Deleted)
[facility]     [address]     [place], [place] [address]     Phone: [phone]      Fax: [phone]        Clinic day:  10/25/2018   Chief Complaint: Rebecca Wolf is a 85 y.o. female with a history of breast cancer, carcinoid tumor, and DVT/PE who is seen for 1 year assessment.  HPI:  The patient was last seen in the medical oncology clinic on 09/23/2017.  At that time, she was doing well.  Exam was stable.  Hematocrit was normal.  MVC was slightly low.  Ferritin was 80.  Bilateral mammogram on 10/21/2017 revealed no evidence of malignancy.   Past Medical History:  Diagnosis Date  . Asthma   . Breast cancer ([place]) 2003   LT LUMPECTOMY  . Breast cancer ([place]) 2004   LT LUMPECTOMY  . 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  . GERD (gastroesophageal reflux disease)   . Hemorrhoids   . Hypertension   . Personal history of chemotherapy 2004   BREAST CA  . Personal history of radiation therapy 2003   BREAST CA  . Personal history of radiation therapy 2004   BREAST CA  . Polyp of colon 2002   bleeding polyp of right ascending colon  . PUD (peptic ulcer disease)   . Seizures ([place])    epilepsy well controlled    Past Surgical History:  Procedure Laterality Date  . BREAST EXCISIONAL BIOPSY Left 2003   positive  . BREAST EXCISIONAL BIOPSY Left 2004   positive  . BREAST LUMPECTOMY Left 2003   BREAST CA  . BREAST LUMPECTOMY Left 2004   BREAST CA  . HEMICOLECTOMY Right    bowel obstruction  . HEMORRHOID SURGERY    . THORACOTOMY/LOBECTOMY  1999   carcinoid tumor  . TOTAL VAGINAL HYSTERECTOMY    . VARICOSE VEIN SURGERY      Family History  Problem Relation Age of Onset  . Alcoholism Father   . Diabetes Sister   . Breast cancer Neg Hx     Social History:  reports that she has quit smoking. She has never used smokeless tobacco. She reports that she does not drink alcohol or use drugs.  She moved from [place] to  [place].  The patient is accompanied by her daughter, [name], today.  Allergies:  Allergies  Allergen Reactions  . Levofloxacin Shortness Of Breath  . Sulfa Antibiotics Rash    Current Medications: Current Outpatient Medications  Medication Sig Dispense Refill  . acetaminophen (TYLENOL) 500 MG tablet Take 500 mg by mouth every 6 (six) hours as needed.    Marland Kitchen albuterol (ACCUNEB) 1.25 MG/3ML nebulizer solution Inhale 1 ampule into the lungs 4 (four) times daily as needed.    Marland Kitchen amLODipine (NORVASC) 5 MG tablet TAKE 1 TABLET BY MOUTH EVERY DAY 90 tablet 0  . diltiazem (CARDIZEM CD) 120 MG 24 hr capsule Take by mouth.    . ELIQUIS 2.5 MG TABS tablet TAKE 1 TABLET BY MOUTH TWICE DAILY 60 tablet 5  . fluticasone (FLONASE) 50 MCG/ACT nasal spray Place 2 sprays into the nose daily.    . Fluticasone-Salmeterol (WIXELA INHUB) 500-50 MCG/DOSE AEPB Inhale 1 puff into the lungs 2 (two) times daily.    Marland Kitchen levETIRAcetam (KEPPRA) 750 MG tablet Take 1 tablet by mouth 2 (two) times daily.    . montelukast (SINGULAIR) 10 MG tablet Take 1 tablet by  mouth daily.    . Olopatadine HCl (PATANASE) 0.6 % SOLN Place 1 puff into the nose 2 (two) times daily.    . pantoprazole (PROTONIX) 40 MG tablet TAKE 1 TABLET BY MOUTH TWICE DAILY 60 tablet 0  . prednisoLONE acetate (PRED FORTE) 1 % ophthalmic suspension INTILL 2 GTS INTO OU QID  0  . spironolactone-hydrochlorothiazide (ALDACTAZIDE) 25-25 MG tablet TAKE 1 TABLET BY MOUTH DAILY 90 tablet 0  . sucralfate (CARAFATE) 1 g tablet Take 1 tablet (1 g total) by mouth 4 (four) times daily. 352 tablet 2   No current facility-administered medications for this visit.     Review of Systems:  GENERAL:  Feels "ok".  No fevers or sweats.  Weight down 1 pounds. PERFORMANCE STATUS (ECOG):  2 HEENT:  No visual changes, runny nose, sore throat, mouth sores or tenderness. Lungs: Chronic shortness of breath (followed by pulmonary).  No cough.  No hemoptysis. Cardiac:  No chest  pain, palpitations, orthopnea, or PND. GI:  Eating well.  No nausea, vomiting, diarrhea, constipation, melena or hematochezia. GU:  No urgency, frequency, dysuria, or hematuria. Musculoskeletal:  No back pain.  No joint pain.  No muscle tenderness. Extremities:  No pain or swelling. Skin:  No skin changes. Neuro:  Neuropathy in hands and feet.  Seizure in 05/2017. No headache, focal weakness, balance or coordination issues. Endocrine:  No diabetes, thyroid issues, hot flashes or night sweats. Psych:  No mood changes, depression or anxiety. Pain:  No focal pain. Review of systems:  All other systems reviewed and found to be negative.  Physical Exam: There were no vitals taken for this visit. GENERAL:  Well developed, well nourished, woman sitting comfortably in a wheelchair under a blanket in the exam room in no acute distress. MENTAL STATUS:  Alert and oriented to person, place and time. HEAD:  Pearline Cables hair pulled back.  Normocephalic, atraumatic, face symmetric, no Cushingoid features. EYES:  Blue gray eyes.  Pupils equal round and reactive to light and accomodation.  No conjunctivitis or scleral icterus. ENT:  Oropharynx clear without lesion.  Tongue normal.  Left lower fractured tooth.  Mucous membranes moist.  RESPIRATORY:  Clear to auscultation without rales, wheezes or rhonchi. CARDIOVASCULAR:  Regular rate and rhythm without murmur, rub or gallop. BREAST:  Right breast without masses, skin changes or nipple discharge.  Left breast with post operative and post radiation changes at 11 o'clock.  No skin changes or nipple discharge.  ABDOMEN:  Soft, non-tender, with active bowel sounds, and no hepatosplenomegaly.  No masses. SKIN:  No rashes or skin changes. EXTREMITIES: No edema, no skin discoloration or tenderness.  No palpable cords. LYMPH NODES: No palpable cervical, supraclavicular, axillary or inguinal adenopathy  NEUROLOGICAL: Unremarkable. PSYCH:  Appropriate.   No visits with  results within 3 Day(s) from this visit.  Latest known visit with results is:  Appointment on 09/23/2017  Component Date Value Ref Range Status  . Ferritin 09/23/2017 80  11 - 307 ng/mL Final   Performed at [facility], [address] [place], [place] [address]  . CA 27.29 09/23/2017 21.3  0.0 - 38.6 U/mL Final   Comment: (NOTE) Siemens Centaur Immunochemiluminometric Methodology [facility]) Values obtained with different assay methods or kits cannot be used interchangeably. Results cannot be interpreted as absolute evidence of the presence or absence of malignant disease. Performed At: [facility] [place], [place] [id number] Rush Farmer MD GT:[id number] Performed at [facility], [address],  [place], [place] [address]   . WBC 09/23/2017 8.7  3.6 - 11.0 K/uL Final  . RBC 09/23/2017 4.79  3.80 - 5.20 MIL/uL Final  . Hemoglobin 09/23/2017 12.0  12.0 - 16.0 g/dL Final  . HCT 09/23/2017 36.7  35.0 - 47.0 % Final  . MCV 09/23/2017 76.6* 80.0 - 100.0 fL Final  . MCH 09/23/2017 25.1* 26.0 - 34.0 pg Final  . MCHC 09/23/2017 32.7  32.0 - 36.0 g/dL Final  . RDW 09/23/2017 15.0* 11.5 - 14.5 % Final  . Platelets 09/23/2017 268  150 - 440 K/uL Final  . Neutrophils Relative % 09/23/2017 54  % Final  . Neutro Abs 09/23/2017 4.7  1.4 - 6.5 K/uL Final  . Lymphocytes Relative 09/23/2017 34  % Final  . Lymphs Abs 09/23/2017 2.9  1.0 - 3.6 K/uL Final  . Monocytes Relative 09/23/2017 7  % Final  . Monocytes Absolute 09/23/2017 0.6  0.2 - 0.9 K/uL Final  . Eosinophils Relative 09/23/2017 4  % Final  . Eosinophils Absolute 09/23/2017 0.4  0 - 0.7 K/uL Final  . Basophils Relative 09/23/2017 1  % Final  . Basophils Absolute 09/23/2017 0.1  0 - 0.1 K/uL Final   Performed at [facility], [address] [place], [place] [address]  . Sodium 09/23/2017 136  135 - 145 mmol/L Final  . Potassium 09/23/2017 3.8  3.5 - 5.1 mmol/L Final  . Chloride 09/23/2017  105  101 - 111 mmol/L Final  . CO2 09/23/2017 18* 22 - 32 mmol/L Final  . Glucose, Bld 09/23/2017 171* 65 - 99 mg/dL Final  . BUN 09/23/2017 21* 6 - 20 mg/dL Final  . Creatinine, Ser 09/23/2017 1.07* 0.44 - 1.00 mg/dL Final  . Calcium 09/23/2017 9.1  8.9 - 10.3 mg/dL Final  . Total Protein 09/23/2017 8.2* 6.5 - 8.1 g/dL Final  . Albumin 09/23/2017 3.7  3.5 - 5.0 g/dL Final  . AST 09/23/2017 40  15 - 41 U/L Final  . ALT 09/23/2017 27  14 - 54 U/L Final  . Alkaline Phosphatase 09/23/2017 96  38 - 126 U/L Final  . Total Bilirubin 09/23/2017 0.5  0.3 - 1.2 mg/dL Final  . GFR calc non Af Amer 09/23/2017 44* >60 mL/min Final  . GFR calc Af Amer 09/23/2017 51* >60 mL/min Final   Comment: (NOTE) The eGFR has been calculated using the CKD EPI equation. This calculation has not been validated in all clinical situations. eGFR's persistently <60 mL/min signify possible Chronic Kidney Disease.   Georgiann Hahn gap 09/23/2017 13  5 - 15 Final   Performed at [facility], [address] [place], [place] [address]    Assessment:  Rebecca Wolf is a 85 y.o. female with a history of breast cancer, carcinoid tumor, and DVT/PE.  She was diagnosed with precancerous breast lesion (? DCIS) in 2003.  She underwent lumpectomy.  She developed invasive lobular carcinoma in 2007. She underwent segmental mastectomy and sentinel lymph node biopsy followed by radiation.  No lymph nodes were involved.  Tumor was ER/PR positive.  She completed 5 years of Arimidex in 08/2011.   Bilateral screening mammogram on 10/21/2017 revealed no evidence of malignancy.  CA27.29 was 20.7 on 09/24/2016 and 21.3 on 09/23/2017.  She was diagnosed with pulmonary carcinoid in 2001 after presenting with an abnormality on chest CT.  She was asymptomatic.  She underwent resection.  She has had no evidence of recurrent disease.  She has a history of DVT x  2 and pulmonary embolism x 2.  She has been on anticoagulation for 8-10 years.  Chest  CT angiogram on 06/29/2017 revealed no evidence of significant pulmonary embolus.  There was an unchanged appearance of right paratracheal nodule and multiple pulmonary nodules.  She was initially on Coumadin, but switched to Eliquis in 2016.  She is unaware of any hypercoagulable work-up.   She has a history of GI bleeding in 2002.  She is s/p partial right ascending hemicolectomy for a bleeding polyp.  She has a history of reflux and peptic ulcer disease.   She has microcytic RBC indices.  Hematocrit is normal (37.8).  MCV is microcytic.  While in [place], a hemoglobin electrophoresis was performed (no result available).  She was started on oral iron on 08/31/2012.  She is currently not taking iron.  Ferritin was 43 on 09/24/2016 and 80 on 09/23/2017.  Symptomatically,   Plan: 1.   Labs today: CBC with diff, CMP, CA27.29, ferritin, iron studies. 2.   Invasive lobular carcinoma  Review interval mammogram - no evidence of malignancy.  Schedule yearly bilateral screening mammogram. 3.  DVT and pulmonary embolism  Continue Eliquis. 4.   Microcytic RBC indices  6.  RTC in 1 year for MD assessment, labs (CBC with diff, CMP, CA27.29), and review of mammogram.   Lequita Asal, MD  10/25/2018, 12:50 PM   I saw and evaluated the patient, participating in the key portions of the service and reviewing pertinent diagnostic studies and records.  I reviewed the nurse practitioner's note and agree with the findings and the plan.  The assessment and plan were discussed with the patient.  Several questions were asked by the patient and answered.   Nolon Stalls, MD 10/25/2018,12:50 PM

## 2018-10-26 ENCOUNTER — Other Ambulatory Visit: Payer: Medicare Other

## 2018-10-26 ENCOUNTER — Ambulatory Visit: Payer: Medicare Other | Admitting: Hematology and Oncology

## 2018-11-05 ENCOUNTER — Other Ambulatory Visit: Payer: Self-pay | Admitting: Internal Medicine

## 2018-11-05 NOTE — Telephone Encounter (Signed)
LM to call back.

## 2018-11-18 ENCOUNTER — Telehealth: Payer: Self-pay | Admitting: Internal Medicine

## 2018-11-18 NOTE — Telephone Encounter (Signed)
Thank you :)

## 2018-11-18 NOTE — Telephone Encounter (Signed)
Pt in now under care of Dr.Kimel-Scott in [facility]

## 2018-11-20 ENCOUNTER — Ambulatory Visit: Payer: Medicare Other | Admitting: Podiatry

## 2018-11-24 ENCOUNTER — Ambulatory Visit: Payer: Medicare Other | Admitting: Hematology and Oncology

## 2018-11-24 ENCOUNTER — Other Ambulatory Visit: Payer: Medicare Other

## 2018-12-01 ENCOUNTER — Other Ambulatory Visit: Payer: Self-pay | Admitting: Internal Medicine

## 2018-12-07 ENCOUNTER — Other Ambulatory Visit: Payer: Self-pay | Admitting: Internal Medicine

## 2018-12-11 ENCOUNTER — Other Ambulatory Visit: Payer: Self-pay | Admitting: Internal Medicine

## 2018-12-13 ENCOUNTER — Other Ambulatory Visit: Payer: Self-pay | Admitting: Internal Medicine

## 2018-12-22 ENCOUNTER — Ambulatory Visit: Payer: Medicare Other | Admitting: Podiatry

## 2019-01-06 ENCOUNTER — Other Ambulatory Visit: Payer: Medicare Other

## 2019-01-06 ENCOUNTER — Ambulatory Visit: Payer: Medicare Other | Admitting: Hematology and Oncology

## 2019-01-07 ENCOUNTER — Ambulatory Visit: Payer: Medicare Other

## 2019-01-15 ENCOUNTER — Ambulatory Visit: Payer: Medicare Other | Admitting: Podiatry

## 2019-02-05 ENCOUNTER — Ambulatory Visit: Payer: Medicare Other | Admitting: Podiatry

## 2019-03-05 ENCOUNTER — Ambulatory Visit: Payer: Medicare Other | Admitting: Podiatry

## 2019-03-19 ENCOUNTER — Encounter: Payer: Self-pay | Admitting: Podiatry

## 2019-03-19 ENCOUNTER — Ambulatory Visit (INDEPENDENT_AMBULATORY_CARE_PROVIDER_SITE_OTHER): Payer: Medicare Other | Admitting: Podiatry

## 2019-03-19 ENCOUNTER — Other Ambulatory Visit: Payer: Self-pay

## 2019-03-19 DIAGNOSIS — B351 Tinea unguium: Secondary | ICD-10-CM | POA: Diagnosis not present

## 2019-03-19 DIAGNOSIS — M79676 Pain in unspecified toe(s): Secondary | ICD-10-CM

## 2019-03-21 NOTE — Progress Notes (Signed)
   SUBJECTIVE Patient presents to office today complaining of elongated, thickened nails that cause pain while ambulating in shoes. She is unable to trim her own nails. Patient is here for further evaluation and treatment.  Past Medical History:  Diagnosis Date  . Asthma   . Breast cancer ([place]) 2003   LT LUMPECTOMY  . Breast cancer ([place]) 2004   LT LUMPECTOMY  . 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  . GERD (gastroesophageal reflux disease)   . Hemorrhoids   . Hypertension   . Personal history of chemotherapy 2004   BREAST CA  . Personal history of radiation therapy 2003   BREAST CA  . Personal history of radiation therapy 2004   BREAST CA  . Polyp of colon 2002   bleeding polyp of right ascending colon  . PUD (peptic ulcer disease)   . Seizures ([place])    epilepsy well controlled    OBJECTIVE General Patient is awake, alert, and oriented x 3 and in no acute distress. Derm Skin is dry and supple bilateral. Negative open lesions or macerations. Remaining integument unremarkable. Nails are tender, long, thickened and dystrophic with subungual debris, consistent with onychomycosis, 1-5 bilateral. No signs of infection noted. Vasc  DP and PT pedal pulses palpable bilaterally. Temperature gradient within normal limits.  Neuro Epicritic and protective threshold sensation grossly intact bilaterally.  Musculoskeletal Exam No symptomatic pedal deformities noted bilateral. Muscular strength within normal limits.  ASSESSMENT 1. Onychodystrophic nails 1-5 bilateral with hyperkeratosis of nails.  2. Onychomycosis of nail due to dermatophyte bilateral 3. Pain in foot bilateral  PLAN OF CARE 1. Patient evaluated today.  2. Instructed to maintain good pedal hygiene and foot care.  3. Mechanical debridement of nails 1-5 bilaterally performed using a nail nipper. Filed with dremel without incident.  4. Return to clinic in 3 mos.    Edrick Kins, DPM [facility]   Dr. Edrick Kins, [place]                                        [place], [place] [address]                Office [phone]  Fax [phone]

## 2019-03-26 IMAGING — CR DG CHEST 2V
1 series · 2 of 2 positions shown · non-contrast
Comparison: June 25, 2016, CT chest January 23, 2017

CLINICAL DATA: Seizure today

EXAM:
CHEST  2 VIEW

[Series 1: dg chest 2 view · 0.14mm/px · 2 of 2 slices shown]
[im 1/2]
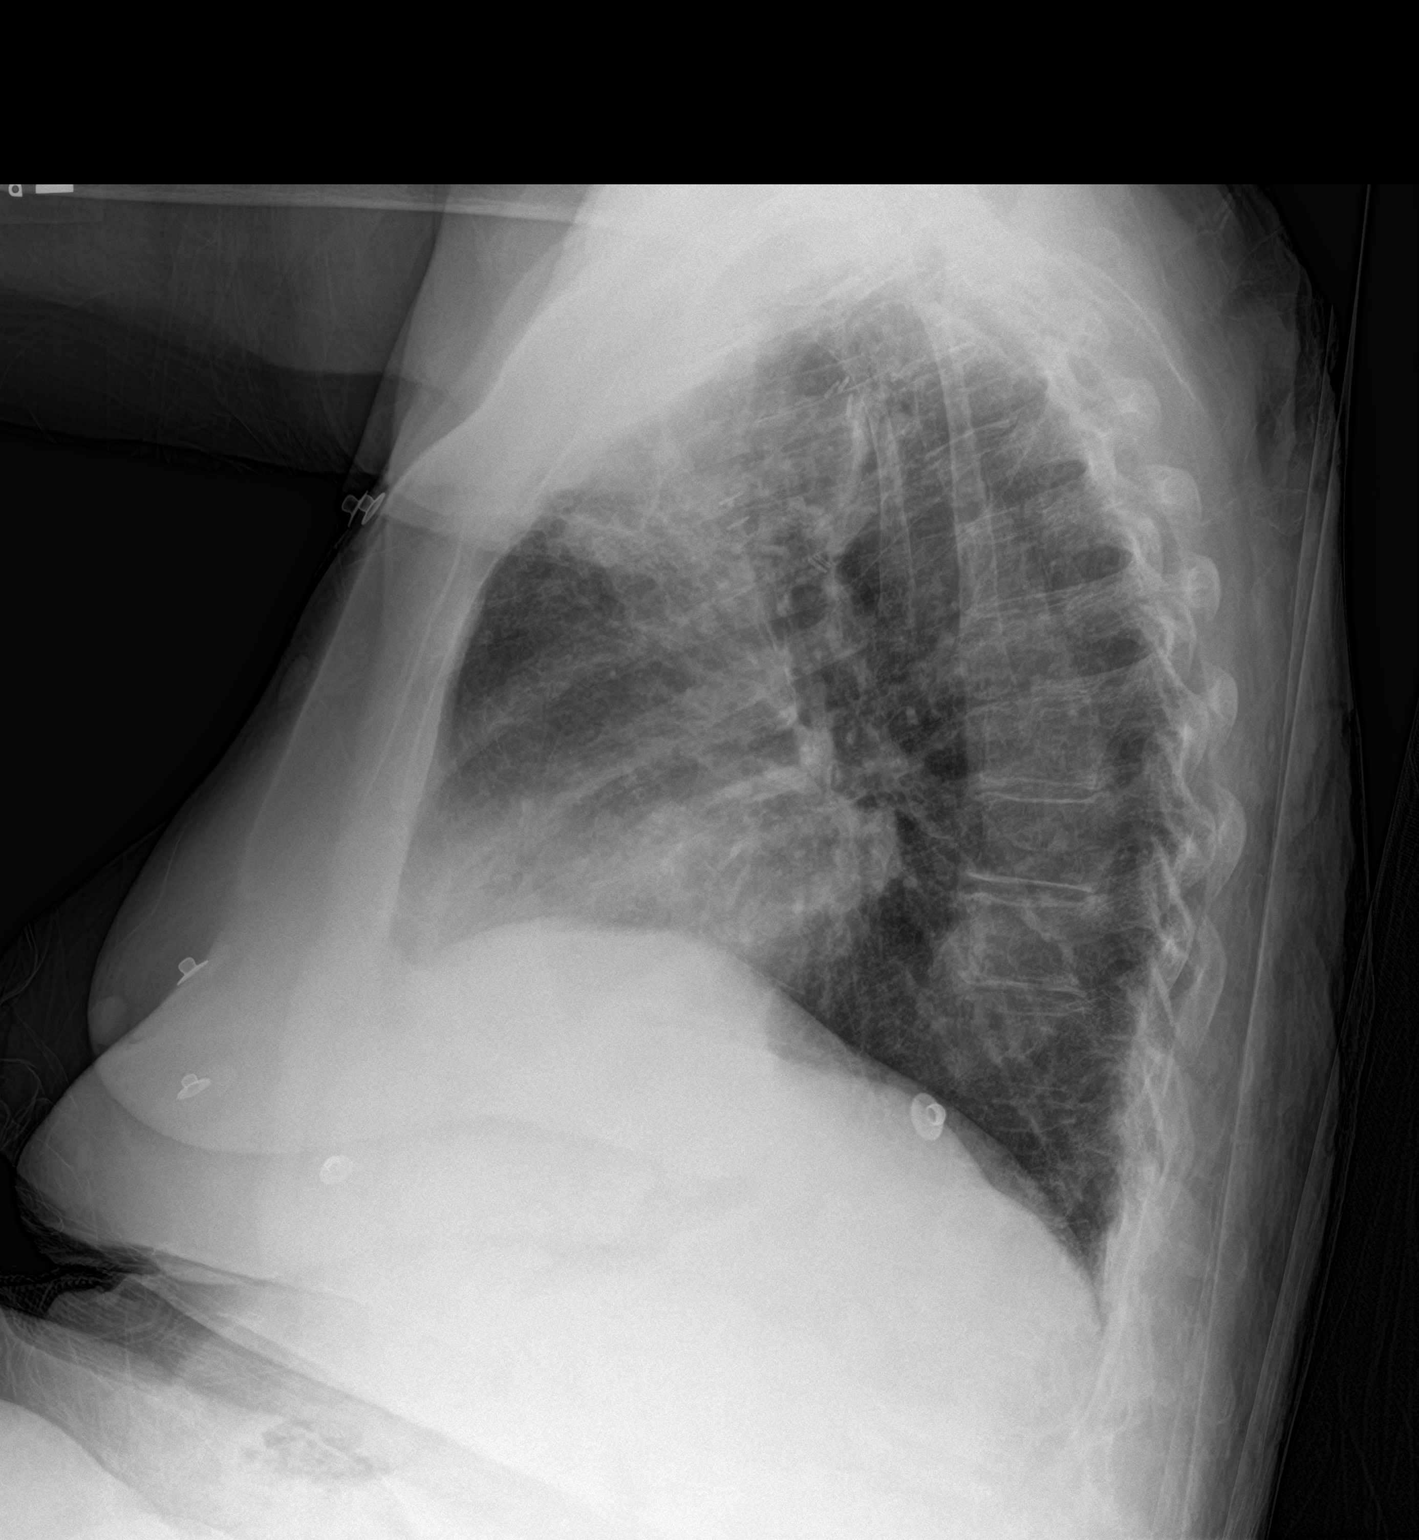
[im 2/2]
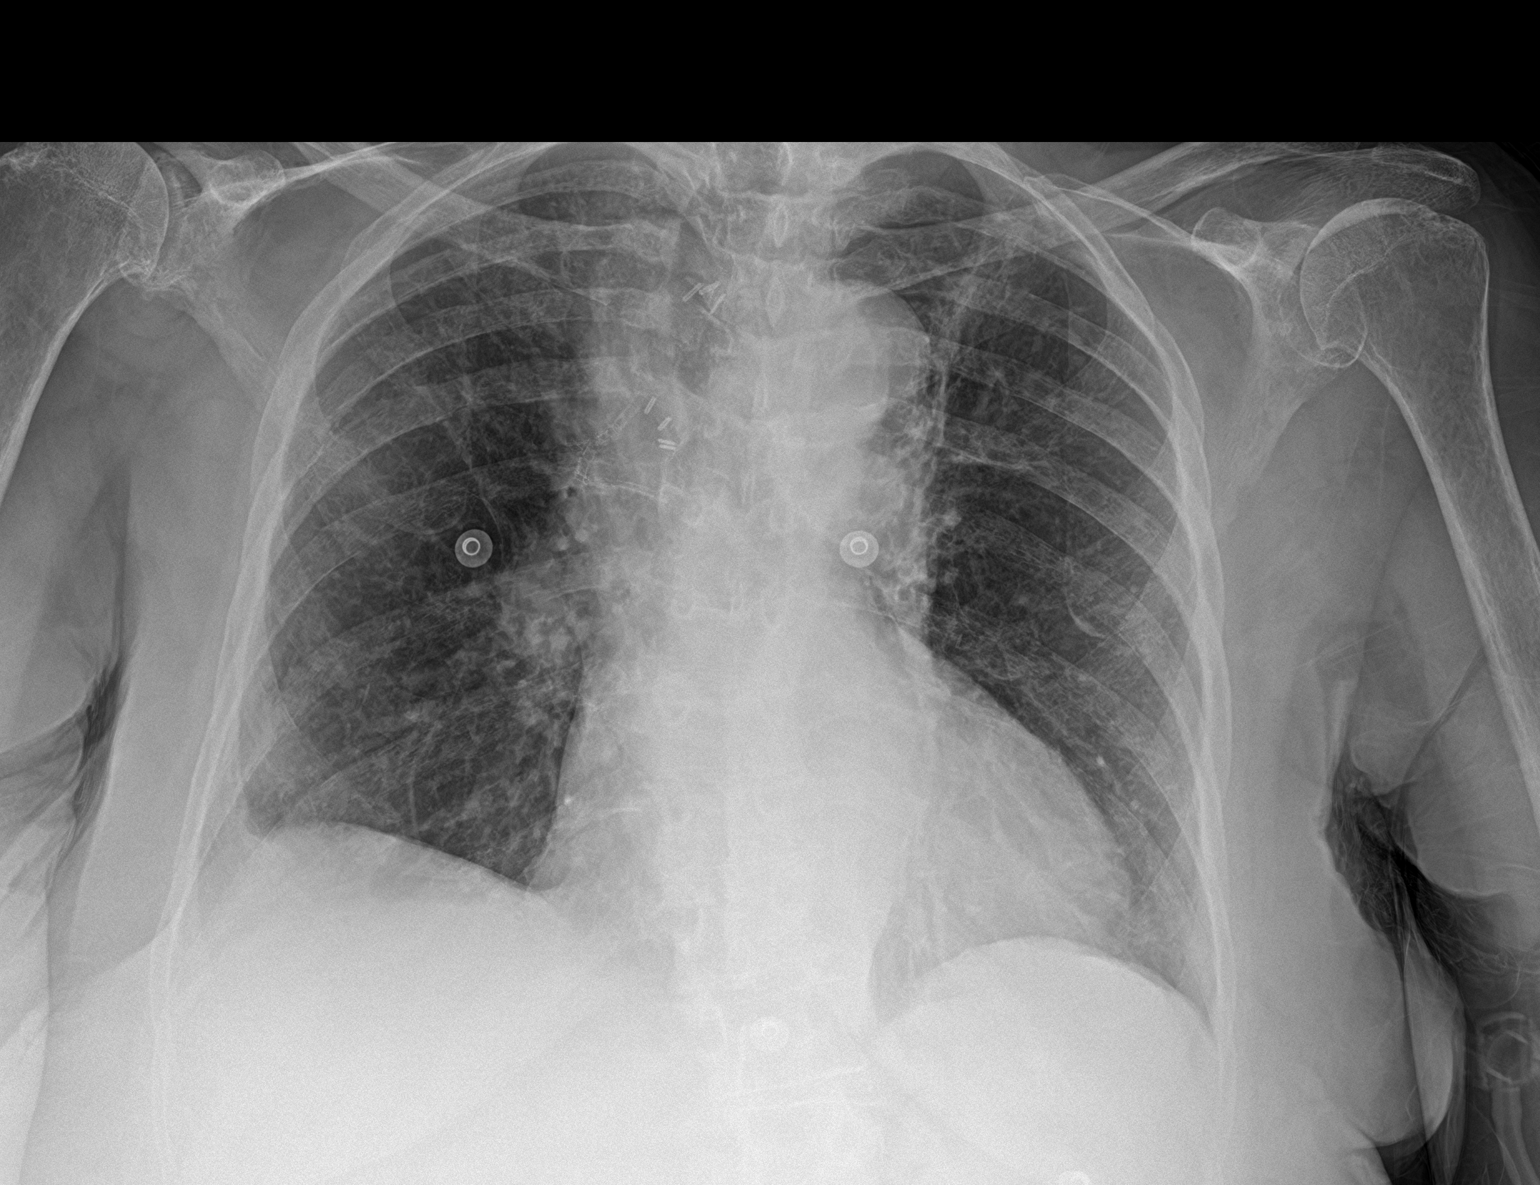

[2 of 2 positions shown; findings below may reference images not displayed]

FINDINGS: The heart size and mediastinal contours are stable. Right
paratracheal prominence is unchanged. There is no focal infiltrate,
pulmonary edema, or pleural effusion. The visualized skeletal
structures are stable.
IMPRESSION: No active cardiopulmonary disease.

## 2019-04-13 ENCOUNTER — Telehealth: Payer: Self-pay

## 2019-04-13 NOTE — Telephone Encounter (Signed)
Spoke with daughter regarding concerns with COVID. Informed her as long as patient is not exhibiting any breast concerns or symptoms, then Dr. Mike Gip is okay with her waiting to return until she is comfortable. Daughter states PCP is collecting labs and wanted to know what labs we may need. Informed her of cbc/d, cmp, CA27.29, ferritin, and iron studies. Encouraged daughter to call back to schedule appt. When patient is comfortable or if she as any symptoms. Daughter verbalizes understanding and denies any further questions or concerns.

## 2019-04-13 NOTE — Telephone Encounter (Signed)
-----   Message from Lequita Asal, MD sent at 04/13/2019 11:37 AM EDT ----- Regarding: FW: Mammogram Contact: [phone]  Please call patient.  We have not seen her since 08/2017.  She has been followed yearly.  Breast cancer diagnosis was in 2007.  Ideally, she should be seen yearly, but I can understand her concerns about COVID-19.  Does she have any breast concerns or new symptoms?  If so, needs to be seen sooner.  Otherwise, when she feels most comfortable returning to clinic.  M  ----- Message ----- From: Secundino Ginger Sent: 04/12/2019   4:19 PM EDT To: Lequita Asal, MD Subject: Mammogram                                      [place],This patient daughter called and said patient has not been seen because of covid and did not have her mammogram. She is wanting to know what you think about how long she has been without an exam. She also said that she is not seeing Because she kept cancelling her appt a mammogram was never orderd. any dr in person. Can she wait until Jan. for a mammogram. And then a Doximinity with you?

## 2019-05-14 ENCOUNTER — Ambulatory Visit: Payer: Medicare Other | Admitting: Podiatry

## 2019-06-04 ENCOUNTER — Other Ambulatory Visit
Admission: RE | Admit: 2019-06-04 | Discharge: 2019-06-04 | Disposition: A | Discharge status: Home / Self Care | Payer: Medicare Other | Source: Ambulatory Visit | Attending: Internal Medicine | Admitting: Internal Medicine

## 2019-06-04 DIAGNOSIS — M79604 Pain in right leg: Secondary | ICD-10-CM | POA: Insufficient documentation

## 2019-06-04 DIAGNOSIS — Z79899 Other long term (current) drug therapy: Secondary | ICD-10-CM | POA: Insufficient documentation

## 2019-06-04 DIAGNOSIS — M79605 Pain in left leg: Secondary | ICD-10-CM | POA: Diagnosis not present

## 2019-06-04 LAB — BRAIN NATRIURETIC PEPTIDE: B Natriuretic Peptide: 37 pg/mL (ref 0.0–100.0)

## 2019-06-11 ENCOUNTER — Ambulatory Visit: Payer: Medicare Other | Admitting: Podiatry

## 2019-07-06 ENCOUNTER — Ambulatory Visit: Payer: Medicare Other

## 2019-07-09 ENCOUNTER — Ambulatory Visit: Payer: Medicare Other | Admitting: Podiatry

## 2019-07-28 ENCOUNTER — Ambulatory Visit: Payer: Medicare Other

## 2019-08-20 ENCOUNTER — Ambulatory Visit: Payer: Medicare Other | Admitting: Podiatry

## 2019-08-23 NOTE — Progress Notes (Signed)
[facility]  [address], [address] [place], [place] [address] Phone: [phone]  Fax: [phone]   Clinic Day:  08/25/2019  Referring physician: Glean Hess, MD  Chief Complaint: Rebecca Wolf is a 86 y.o. female with a history of breast cancer, carcinoid tumor, and DVT/PE who is seen for a 2 year reassessment.     HPI: The patient was last seen in the medical oncology clinic on 09/23/2017. At that time, she was doing well.  Exam was stable.  Hematocrit 36.7, hemoglobin 12.0, MCV 76.6, platelets 268,000, WBC 8,700. Ferritin was 80. Creatinine 1.07. Total protein 8.2. CA 27.29 was 21.3. She continued on Eliquis.   Bilateral mammogram on 10/21/2017 showed no evidence of malignancy.   Patient was seen by Dr. Teresa Coombs in gyenecology on 10/30/2017. She desired a pessary as a means of controlling her symptoms of prolapse and/or urinary incontinence. Surgery was discussed. A 3 ring pessary was fitted without difficulty.   She was seen by Dr. Vicente Males on 12/25/2017 for an initial consult regarding abdominal pain. She had on and off again abdominal pain for 1 month. Pain increased after drinking cold water or eating.  She described the pain as dull in nature. She had a bowel movement every 3 hours-formed to loose. Dr. Vicente Males thought symptoms were IBS related. Patient declined endoscopy to do her age and risks. She wanted to start empiric treatment. If no improvement, imaging wold be obtained.   Bilateral lower venous ultrasound on 01/06/2018 showed no evidence of BLE DVT.   Labs from [place] on 06/04/2019 showed hematocrit 31.7, hemoglobin 10.0, MCV 78.7, platelets 220,000, WBC 7,700, eosinophil count 500. Potassium 2.9, calcium 8.4, AST <3. Albumin 3.4. Magnesium 1.1.   During the interim, she has felt "ok".  [name] notes that she did not have her mother come to clinic for follow up because of the pandemic. She had her last COVID-19 vaccine on 08/09/2019 at [facility].  She has follow ups with her PCP via telemedicine. Her PCP noted she was slightly anemic but did not prescribe any oral iron.    Her daughter denies any major changes. The patient has been feeling "ok" over the years. She has joint pain her elbows. She has numbness in her feet and hands.  She has shortness of breath on exertion. She denies having any seizures. She ambulates with a rolling walker. She does not perform regular breast exams. She denies any nipple discharge or skin changes. Patient declines mammogram unless concern is found on physical exam.  She is on Eliquis and denies any bleeding or bruising. She is not on any oral iron. She is taking some vitamin D supplements. She is eating well with a good diet. She had some diarrhea that has since resolved.  Her daughter is trying to get her out the house more this year. They drive around town and stay in the car. She has not been to a grocery store in 1 year.    Past Medical History:  Diagnosis Date  . Asthma   . Breast cancer ([place]) 2003   LT LUMPECTOMY  . Breast cancer ([place]) 2004   LT LUMPECTOMY  . 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  . GERD (gastroesophageal reflux disease)   . Hemorrhoids   . Hypertension   . Personal history of chemotherapy 2004   BREAST CA  . Personal history of radiation therapy 2003   BREAST CA  . Personal history of radiation therapy 2004  BREAST CA  . Polyp of colon 2002   bleeding polyp of right ascending colon  . PUD (peptic ulcer disease)   . Seizures ([place])    epilepsy well controlled    Past Surgical History:  Procedure Laterality Date  . BREAST EXCISIONAL BIOPSY Left 2003   positive  . BREAST EXCISIONAL BIOPSY Left 2004   positive  . BREAST LUMPECTOMY Left 2003   BREAST CA  . BREAST LUMPECTOMY Left 2004   BREAST CA  . HEMICOLECTOMY Right    bowel obstruction  . HEMORRHOID SURGERY    . THORACOTOMY/LOBECTOMY  1999   carcinoid tumor  . TOTAL VAGINAL HYSTERECTOMY    .  VARICOSE VEIN SURGERY      Family History  Problem Relation Age of Onset  . Alcoholism Father   . Diabetes Sister   . Breast cancer Neg Hx     Social History:  reports that she has quit smoking. She has never used smokeless tobacco. She reports that she does not drink alcohol or use drugs.She has a daughter named [name]. The patient is accompanied by [name] via [place] today.  Allergies:  Allergies  Allergen Reactions  . Levofloxacin Shortness Of Breath  . Sulfa Antibiotics Rash    Current Medications: Current Outpatient Medications  Medication Sig Dispense Refill  . acetaminophen (TYLENOL) 500 MG tablet Take 500 mg by mouth every 6 (six) hours as needed.    Marland Kitchen albuterol (ACCUNEB) 1.25 MG/3ML nebulizer solution Inhale 1 ampule into the lungs 4 (four) times daily as needed.    Marland Kitchen amLODipine (NORVASC) 5 MG tablet TAKE 1 TABLET BY MOUTH EVERY DAY (Patient taking differently: Take 2.5 mg by mouth daily. ) 90 tablet 0  . azelastine (ASTELIN) 0.1 % nasal spray U 1 SPRAY IEN BID TO REPLACE PATANASE    . azithromycin (ZITHROMAX) 250 MG tablet Take 250 mg by mouth daily.     . budesonide (PULMICORT) 0.5 MG/2ML nebulizer solution Inhale into the lungs.    . Cholecalciferol 25 MCG (1000 UT) tablet Take 1,000 Units by mouth daily.     Marland Kitchen diltiazem (CARDIZEM CD) 120 MG 24 hr capsule Take 120 mg by mouth daily.     Marland Kitchen ELIQUIS 2.5 MG TABS tablet TAKE 1 TABLET BY MOUTH TWICE DAILY 60 tablet 5  . fluticasone (FLONASE) 50 MCG/ACT nasal spray Place 2 sprays into the nose daily.    Marland Kitchen levalbuterol (XOPENEX) 0.63 MG/3ML nebulizer solution VVN Q 4 H PRN FOR WHZ    . levETIRAcetam (KEPPRA) 750 MG tablet Take 1 tablet by mouth 2 (two) times daily.    . montelukast (SINGULAIR) 10 MG tablet Take 1 tablet by mouth daily.    . pantoprazole (PROTONIX) 40 MG tablet TAKE 1 TABLET BY MOUTH TWICE DAILY 60 tablet 0  . prednisoLONE acetate (PRED FORTE) 1 % ophthalmic suspension INTILL 2 GTS INTO OU QID  0  .  predniSONE (DELTASONE) 2.5 MG tablet Take 2.5 mg by mouth daily with breakfast.     . spironolactone-hydrochlorothiazide (ALDACTAZIDE) 25-25 MG tablet TAKE 1 TABLET BY MOUTH DAILY 90 tablet 0  . sucralfate (CARAFATE) 1 g tablet Take 1 tablet (1 g total) by mouth 4 (four) times daily. 352 tablet 2  . Fluticasone-Salmeterol (WIXELA INHUB) 500-50 MCG/DOSE AEPB Inhale 1 puff into the lungs 2 (two) times daily.    . Olopatadine HCl (PATANASE) 0.6 % SOLN Place 1 puff into the nose 2 (two) times daily.    . SYMBICORT 160-4.5 MCG/ACT inhaler INL  2 PFS PO BID     No current facility-administered medications for this visit.    Review of Systems  Constitutional: Positive for weight loss (7 pounds). Negative for chills, diaphoresis, fever and malaise/fatigue.       Feels "ok".  HENT: Negative.  Negative for congestion, ear discharge, ear pain, hearing loss, nosebleeds, sinus pain and sore throat.   Eyes: Negative.  Negative for blurred vision.  Respiratory: Positive for shortness of breath (on exertion). Negative for cough, hemoptysis and sputum production.   Cardiovascular: Negative.  Negative for chest pain, palpitations and leg swelling.  Gastrointestinal: Negative.  Negative for abdominal pain, blood in stool, constipation, diarrhea, heartburn, melena, nausea and vomiting.       Good diet. Eating well.  Genitourinary: Negative.  Negative for dysuria, frequency, hematuria and urgency.  Musculoskeletal: Positive for joint pain (elbows). Negative for back pain, myalgias and neck pain.  Skin: Negative.  Negative for itching and rash.  Neurological: Positive for sensory change (numbness in hands and feet) and weakness (generalized). Negative for dizziness, tingling, speech change, focal weakness, seizures and headaches.       No seizures.  Endo/Heme/Allergies: Negative.  Does not bruise/bleed easily.  Psychiatric/Behavioral: Negative.  Negative for depression and memory loss. The patient is not  nervous/anxious and does not have insomnia.   All other systems reviewed and are negative.  Performance status (ECOG): 1-2  Vitals Blood pressure 133/63, pulse 91, temperature (!) 97.2 F (36.2 C), temperature source Tympanic, resp. rate 18, weight 170 lb 6.4 oz (77.3 kg), SpO2 99 %.   Physical Exam  Constitutional: She is oriented to person, place, and time. She appears well-developed and well-nourished. No distress.  She has a rolling walker by her side. Patient examined in chair.  HENT:  Head: Normocephalic and atraumatic.  Mouth/Throat: Oropharynx is clear and moist. No oropharyngeal exudate.  Gray hair pulled back.  Eyes: Pupils are equal, round, and reactive to light. Conjunctivae and EOM are normal. No scleral icterus.  Wearing safety goggles.  Cardiovascular: Normal rate, regular rhythm and normal heart sounds.  No murmur heard. Pulmonary/Chest: Effort normal and breath sounds normal. No respiratory distress. She has no wheezes. She has no rales. She exhibits no tenderness. Right breast exhibits tenderness. Right breast exhibits no skin change (fibrocystic changes inferiorly; 11 o'clock).  Abdominal: Soft. Bowel sounds are normal. She exhibits no distension and no mass. There is no abdominal tenderness. There is no rebound and no guarding.  Musculoskeletal:        General: No tenderness or edema. Normal range of motion.     Cervical back: Normal range of motion and neck supple.  Lymphadenopathy:       Head (right side): No preauricular, no posterior auricular and no occipital adenopathy present.       Head (left side): No preauricular, no posterior auricular and no occipital adenopathy present.    She has no cervical adenopathy.    She has no axillary adenopathy.       Right: No inguinal and no supraclavicular adenopathy present.       Left: No inguinal and no supraclavicular adenopathy present.  Neurological: She is alert and oriented to person, place, and time.  Skin: Skin  is warm and dry. No rash noted. She is not diaphoretic. No erythema. No pallor.  Psychiatric: She has a normal mood and affect. Her behavior is normal. Judgment and thought content normal.  Nursing note and vitals reviewed.   Appointment on 08/25/2019  Component Date Value Ref Range Status  . Sodium 08/25/2019 136  135 - 145 mmol/L Final  . Potassium 08/25/2019 3.5  3.5 - 5.1 mmol/L Final  . Chloride 08/25/2019 103  98 - 111 mmol/L Final  . CO2 08/25/2019 19* 22 - 32 mmol/L Final  . Glucose, Bld 08/25/2019 163* 70 - 99 mg/dL Final   Glucose reference range applies only to samples taken after fasting for at least 8 hours.  . BUN 08/25/2019 19  8 - 23 mg/dL Final  . Creatinine, Ser 08/25/2019 1.10* 0.44 - 1.00 mg/dL Final  . Calcium 08/25/2019 8.5* 8.9 - 10.3 mg/dL Final  . Total Protein 08/25/2019 7.0  6.5 - 8.1 g/dL Final  . Albumin 08/25/2019 3.4* 3.5 - 5.0 g/dL Final  . AST 08/25/2019 19  15 - 41 U/L Final  . ALT 08/25/2019 13  0 - 44 U/L Final  . Alkaline Phosphatase 08/25/2019 81  38 - 126 U/L Final  . Total Bilirubin 08/25/2019 0.4  0.3 - 1.2 mg/dL Final  . GFR calc non Af Amer 08/25/2019 44* >60 mL/min Final  . GFR calc Af Amer 08/25/2019 50* >60 mL/min Final  . Anion gap 08/25/2019 14  5 - 15 Final   Performed at [facility], [address], [place], [place] [address]  . WBC 08/25/2019 9.3  4.0 - 10.5 K/uL Final  . RBC 08/25/2019 4.17  3.87 - 5.11 MIL/uL Final  . Hemoglobin 08/25/2019 9.9* 12.0 - 15.0 g/dL Final  . HCT 08/25/2019 31.4* 36.0 - 46.0 % Final  . MCV 08/25/2019 75.3* 80.0 - 100.0 fL Final  . MCH 08/25/2019 23.7* 26.0 - 34.0 pg Final  . MCHC 08/25/2019 31.5  30.0 - 36.0 g/dL Final  . RDW 08/25/2019 14.9  11.5 - 15.5 % Final  . Platelets 08/25/2019 281  150 - 400 K/uL Final  . nRBC 08/25/2019 0.0  0.0 - 0.2 % Final  . Neutrophils Relative % 08/25/2019 50  % Final  . Neutro Abs 08/25/2019 4.6  1.7 - 7.7 K/uL Final  . Lymphocytes Relative 08/25/2019  39  % Final  . Lymphs Abs 08/25/2019 3.6  0.7 - 4.0 K/uL Final  . Monocytes Relative 08/25/2019 7  % Final  . Monocytes Absolute 08/25/2019 0.6  0.1 - 1.0 K/uL Final  . Eosinophils Relative 08/25/2019 3  % Final  . Eosinophils Absolute 08/25/2019 0.3  0.0 - 0.5 K/uL Final  . Basophils Relative 08/25/2019 1  % Final  . Basophils Absolute 08/25/2019 0.1  0.0 - 0.1 K/uL Final  . Immature Granulocytes 08/25/2019 0  % Final  . Abs Immature Granulocytes 08/25/2019 0.04  0.00 - 0.07 K/uL Final   Performed at [facility], [address], [place], [place] [address]    Assessment:  KAO MCKEONE is a 86 y.o. female with a history of breast cancer, carcinoid tumor, and DVT/PE.  She was diagnosed with precancerous breast lesion (? DCIS) in 2003.  She underwent lumpectomy.  She developed invasive lobular carcinoma in 2007. She underwent segmental mastectomy and sentinel lymph node biopsy followed by radiation.  No lymph nodes were involved.  Tumor was ER/PR positive.  She completed 5 years of Arimidex in 08/2011. Mammogram on 10/16/2016 was negative.  CA27.29 was 20.7 (normal) on 09/24/2016.  She was diagnosed with pulmonary carcinoid in 2001 after presenting with an abnormality on chest CT.  She was asymptomatic.  She underwent resection.  She has had no evidence of recurrent  disease.  She has a history of DVT x 2 and pulmonary embolism x 2.  She has been on anticoagulation for 8-10 years.  Chest CT angiogram on 06/29/2017 revealed no evidence of significant pulmonary embolus.  There was an unchanged appearance of right paratracheal nodule and multiple pulmonary nodules.  She was initially on Coumadin, but switched to Eliquis in 2016.  She is unaware of any hypercoagulable work-up.   She has a history of GI bleeding in 2002.  She is s/p partial right ascending hemicolectomy for a bleeding polyp.  She has a history of reflux and peptic ulcer disease.  Hematocrit is normal (37.8).  MCV is  microcytic.  While in [place], a hemoglobin electrophoresis was performed (no result available).  She was started on oral on 08/31/2012.  She is currently not taking iron.  Symptomatically, she feels "ok".  Exam is stable.  Plan: 1.   Labs today: CBC with diff, CMP, CA27.29, SPEP, ferritin, iron studies, B12, folate. 2.   History of invasive lobular cancer  Clinically, she is doing well.  Exam reveals no screen masses, skin changes or nipple discharge.  Patient wishes to defer follow-up mammogram unless any concern. 3.   Carcinoid syndrome  No worrisome symptoms.  Continue to monitor. 4.   Recurrent DVT and pulmonary embolism  Patient has remained on Eliquis  She denies any bleeding or recurrent thrombosis.  Continue Eliquis. 5.   History of MGUS  Check SPEP. 6.   Iron deficiency anemia  Hematocrit 31.4.  Hemoglobin 9.9.  MCV 75.3.   Ferritin 12 with an iron saturation of 7% and a TIBC of 420.  Patient denies any melena, hematochezia, hematuria or vaginal bleeding.  Patient seen by Dr. Vicente Males in 2019 and declined endoscopy secondary to age and risks. 7.   B12 and folate deficiency  Folate 5.1 and B12 157 today.  Results available after clinic.  Begin supplementation. 8.   RTC in 1 week for MD assessment (virtual), review of work-up and discussion regarding direction of therapy.  Addendum:  Anemia work-up available after clinic.  The patient's daughter was contacted after her clinic.  Recommendation was to begin folic acid 1 mg a day and B12 at 1000 mcg a day.  She was also instructed to begin oral iron with vitamin C or orange juice.  I discussed the assessment and treatment plan with the patient.  The patient was provided an opportunity to ask questions and all were answered.  The patient agreed with the plan and demonstrated an understanding of the instructions.  The patient was advised to call back if the symptoms worsen or if the condition fails to improve as anticipated.  I  provided 21 minutes of face-to-face time during this this encounter and > 50% was spent counseling as documented under my assessment and plan.   An additional 10 minutes were spent reviewing herchart (Epic and Care Everywhere) including notes, labs, and imaging studies.    Lequita Asal, MD, PhD    08/25/2019, 11:50 AM  I, Selena Batten, am acting as scribe for [employer]. Mike Gip, MD, PhD.  I, Square Jowett C. Mike Gip, MD, have reviewed the above documentation for accuracy and completeness, and I agree with the above.

## 2019-08-24 ENCOUNTER — Other Ambulatory Visit: Payer: Self-pay

## 2019-08-24 NOTE — Progress Notes (Signed)
Confirmed Name and DOB. Assessment completed with assistance from daughter, [name]. Denies any concerns.

## 2019-08-25 ENCOUNTER — Other Ambulatory Visit: Payer: Self-pay

## 2019-08-25 ENCOUNTER — Encounter: Payer: Self-pay | Admitting: Hematology and Oncology

## 2019-08-25 ENCOUNTER — Inpatient Hospital Stay (HOSPITAL_BASED_OUTPATIENT_CLINIC_OR_DEPARTMENT_OTHER): Payer: Medicare Other | Admitting: Hematology and Oncology

## 2019-08-25 ENCOUNTER — Other Ambulatory Visit: Payer: Self-pay | Admitting: Hematology and Oncology

## 2019-08-25 ENCOUNTER — Inpatient Hospital Stay: Payer: Medicare Other | Attending: Hematology and Oncology

## 2019-08-25 VITALS — BP 133/63 | HR 91 | Temp 97.2°F | Resp 18 | Wt 170.4 lb

## 2019-08-25 DIAGNOSIS — D472 Monoclonal gammopathy: Secondary | ICD-10-CM | POA: Insufficient documentation

## 2019-08-25 DIAGNOSIS — Z853 Personal history of malignant neoplasm of breast: Secondary | ICD-10-CM | POA: Insufficient documentation

## 2019-08-25 DIAGNOSIS — Z86711 Personal history of pulmonary embolism: Secondary | ICD-10-CM | POA: Diagnosis not present

## 2019-08-25 DIAGNOSIS — R634 Abnormal weight loss: Secondary | ICD-10-CM | POA: Insufficient documentation

## 2019-08-25 DIAGNOSIS — R718 Other abnormality of red blood cells: Secondary | ICD-10-CM

## 2019-08-25 DIAGNOSIS — M2550 Pain in unspecified joint: Secondary | ICD-10-CM | POA: Diagnosis not present

## 2019-08-25 DIAGNOSIS — Z7901 Long term (current) use of anticoagulants: Secondary | ICD-10-CM | POA: Diagnosis not present

## 2019-08-25 DIAGNOSIS — I2782 Chronic pulmonary embolism: Secondary | ICD-10-CM | POA: Diagnosis not present

## 2019-08-25 DIAGNOSIS — Z902 Acquired absence of lung [part of]: Secondary | ICD-10-CM | POA: Insufficient documentation

## 2019-08-25 DIAGNOSIS — K219 Gastro-esophageal reflux disease without esophagitis: Secondary | ICD-10-CM | POA: Diagnosis not present

## 2019-08-25 DIAGNOSIS — Z85110 Personal history of malignant carcinoid tumor of bronchus and lung: Secondary | ICD-10-CM | POA: Insufficient documentation

## 2019-08-25 DIAGNOSIS — E538 Deficiency of other specified B group vitamins: Secondary | ICD-10-CM | POA: Diagnosis not present

## 2019-08-25 DIAGNOSIS — Z7951 Long term (current) use of inhaled steroids: Secondary | ICD-10-CM | POA: Diagnosis not present

## 2019-08-25 DIAGNOSIS — R200 Anesthesia of skin: Secondary | ICD-10-CM | POA: Insufficient documentation

## 2019-08-25 DIAGNOSIS — D509 Iron deficiency anemia, unspecified: Secondary | ICD-10-CM | POA: Diagnosis not present

## 2019-08-25 DIAGNOSIS — Z923 Personal history of irradiation: Secondary | ICD-10-CM | POA: Diagnosis not present

## 2019-08-25 DIAGNOSIS — C50919 Malignant neoplasm of unspecified site of unspecified female breast: Secondary | ICD-10-CM

## 2019-08-25 DIAGNOSIS — Z8711 Personal history of peptic ulcer disease: Secondary | ICD-10-CM | POA: Diagnosis not present

## 2019-08-25 DIAGNOSIS — D649 Anemia, unspecified: Secondary | ICD-10-CM

## 2019-08-25 DIAGNOSIS — I10 Essential (primary) hypertension: Secondary | ICD-10-CM | POA: Diagnosis not present

## 2019-08-25 DIAGNOSIS — Z7952 Long term (current) use of systemic steroids: Secondary | ICD-10-CM | POA: Insufficient documentation

## 2019-08-25 DIAGNOSIS — E340 Carcinoid syndrome: Secondary | ICD-10-CM | POA: Diagnosis not present

## 2019-08-25 DIAGNOSIS — Z79899 Other long term (current) drug therapy: Secondary | ICD-10-CM | POA: Insufficient documentation

## 2019-08-25 DIAGNOSIS — Z87891 Personal history of nicotine dependence: Secondary | ICD-10-CM | POA: Insufficient documentation

## 2019-08-25 DIAGNOSIS — Z8719 Personal history of other diseases of the digestive system: Secondary | ICD-10-CM | POA: Diagnosis not present

## 2019-08-25 DIAGNOSIS — Z86718 Personal history of other venous thrombosis and embolism: Secondary | ICD-10-CM | POA: Insufficient documentation

## 2019-08-25 DIAGNOSIS — Z170 Estrogen receptor positive status [ER+]: Secondary | ICD-10-CM

## 2019-08-25 LAB — CBC WITH DIFFERENTIAL/PLATELET
Abs Immature Granulocytes: 0.04 10*3/uL (ref 0.00–0.07)
Basophils Absolute: 0.1 10*3/uL (ref 0.0–0.1)
Basophils Relative: 1 %
Eosinophils Absolute: 0.3 10*3/uL (ref 0.0–0.5)
Eosinophils Relative: 3 %
HCT: 31.4 % — ABNORMAL LOW (ref 36.0–46.0)
Hemoglobin: 9.9 g/dL — ABNORMAL LOW (ref 12.0–15.0)
Immature Granulocytes: 0 %
Lymphocytes Relative: 39 %
Lymphs Abs: 3.6 10*3/uL (ref 0.7–4.0)
MCH: 23.7 pg — ABNORMAL LOW (ref 26.0–34.0)
MCHC: 31.5 g/dL (ref 30.0–36.0)
MCV: 75.3 fL — ABNORMAL LOW (ref 80.0–100.0)
Monocytes Absolute: 0.6 10*3/uL (ref 0.1–1.0)
Monocytes Relative: 7 %
Neutro Abs: 4.6 10*3/uL (ref 1.7–7.7)
Neutrophils Relative %: 50 %
Platelets: 281 10*3/uL (ref 150–400)
RBC: 4.17 MIL/uL (ref 3.87–5.11)
RDW: 14.9 % (ref 11.5–15.5)
WBC: 9.3 10*3/uL (ref 4.0–10.5)
nRBC: 0 % (ref 0.0–0.2)

## 2019-08-25 LAB — COMPREHENSIVE METABOLIC PANEL
ALT: 13 U/L (ref 0–44)
AST: 19 U/L (ref 15–41)
Albumin: 3.4 g/dL — ABNORMAL LOW (ref 3.5–5.0)
Alkaline Phosphatase: 81 U/L (ref 38–126)
Anion gap: 14 (ref 5–15)
BUN: 19 mg/dL (ref 8–23)
CO2: 19 mmol/L — ABNORMAL LOW (ref 22–32)
Calcium: 8.5 mg/dL — ABNORMAL LOW (ref 8.9–10.3)
Chloride: 103 mmol/L (ref 98–111)
Creatinine, Ser: 1.1 mg/dL — ABNORMAL HIGH (ref 0.44–1.00)
GFR calc Af Amer: 50 mL/min — ABNORMAL LOW (ref >60)
GFR calc non Af Amer: 44 mL/min — ABNORMAL LOW (ref >60)
Glucose, Bld: 163 mg/dL — ABNORMAL HIGH (ref 70–99)
Potassium: 3.5 mmol/L (ref 3.5–5.1)
Sodium: 136 mmol/L (ref 135–145)
Total Bilirubin: 0.4 mg/dL (ref 0.3–1.2)
Total Protein: 7 g/dL (ref 6.5–8.1)

## 2019-08-25 LAB — FERRITIN: Ferritin: 12 ng/mL (ref 11–307)

## 2019-08-25 LAB — RETICULOCYTES
Immature Retic Fract: 22.5 % — ABNORMAL HIGH (ref 2.3–15.9)
RBC.: 4.24 MIL/uL (ref 3.87–5.11)
Retic Count, Absolute: 69.5 10*3/uL (ref 19.0–186.0)
Retic Ct Pct: 1.6 % (ref 0.4–3.1)

## 2019-08-25 LAB — IRON AND TIBC
Iron: 30 ug/dL (ref 28–170)
Saturation Ratios: 7 % — ABNORMAL LOW (ref 10.4–31.8)
TIBC: 420 ug/dL (ref 250–450)
UIBC: 390 ug/dL

## 2019-08-25 LAB — VITAMIN B12: Vitamin B-12: 157 pg/mL — ABNORMAL LOW (ref 180–914)

## 2019-08-25 LAB — TSH: TSH: 2.163 u[IU]/mL (ref 0.350–4.500)

## 2019-08-25 LAB — FOLATE: Folate: 5.1 ng/mL — ABNORMAL LOW (ref >5.9)

## 2019-08-25 NOTE — Progress Notes (Signed)
No new changes noted today 

## 2019-08-26 ENCOUNTER — Telehealth: Payer: Self-pay

## 2019-08-26 DIAGNOSIS — D509 Iron deficiency anemia, unspecified: Secondary | ICD-10-CM | POA: Insufficient documentation

## 2019-08-26 LAB — PROTEIN ELECTROPHORESIS, SERUM
A/G Ratio: 1.1 (ref 0.7–1.7)
Albumin ELP: 3.2 g/dL (ref 2.9–4.4)
Alpha-1-Globulin: 0.2 g/dL (ref 0.0–0.4)
Alpha-2-Globulin: 0.9 g/dL (ref 0.4–1.0)
Beta Globulin: 1 g/dL (ref 0.7–1.3)
Gamma Globulin: 1 g/dL (ref 0.4–1.8)
Globulin, Total: 3 g/dL (ref 2.2–3.9)
Total Protein ELP: 6.2 g/dL (ref 6.0–8.5)

## 2019-08-26 LAB — CA 27.29 (SERIAL MONITOR): CA 27.29: 12.9 U/mL (ref 0.0–38.6)

## 2019-08-26 NOTE — Telephone Encounter (Signed)
-----   Message from Lequita Asal, MD sent at 08/26/2019  5:48 AM EST ----- Regarding: Please call patient  B12 and folate are low.  Begin B12 1000 mcg po q day.  Begin folic acid 1 mg a day.  M ----- Message ----- From: Buel Ream, Lab In [place] Sent: 08/25/2019   3:30 PM EST To: Lequita Asal, MD

## 2019-08-26 NOTE — Telephone Encounter (Signed)
Left a message to inform her daughter that we need to speak with her about her mother labs.

## 2019-08-26 NOTE — Telephone Encounter (Signed)
Spoke with ms Decker daughter to inform her that Per Dr Mike Gip orders she will like for the patient to start ferrous sulfate 325 mg 1 tab po daily with OJ or vitamin C. Start B-12 1000 mcg 1 tab po daily, start folate acid 1 mg 1 tab po daily. Will recheck labs in 1 month which has been schedule. Mrs, Likely was understanding and agreeable.

## 2019-08-29 DIAGNOSIS — D649 Anemia, unspecified: Secondary | ICD-10-CM | POA: Insufficient documentation

## 2019-08-30 ENCOUNTER — Telehealth: Payer: Self-pay | Admitting: *Deleted

## 2019-08-30 ENCOUNTER — Telehealth: Payer: Self-pay

## 2019-08-30 NOTE — Telephone Encounter (Signed)
Patient's daughter, [name], called to say she only had one dipstick for the patient's stool collection.  Advised her to use a plastic utensil to retrieve stool for the card.  Daughter is also asking if next appointment is necessary.  I will send the team a message to see if it is.  Daughter verbalized understanding.

## 2019-08-30 NOTE — Telephone Encounter (Signed)
Spoke with the patient daughter ( kim) she had some concerns. She states she just started the stool cards today and wanted to know should she keep her mother schedule appointment even though she dont have the stool cards. Yes I have inform her to keep the schedule appointment. Ms Rebecca Wolf was understanding and agreeable.

## 2019-08-30 NOTE — Progress Notes (Signed)
[facility]  [address] [place], [place] [address] Phone: [phone]  Fax: [phone]   Telemedicine Office Visit:  09/02/2019  Referring physician: Erline Levine, MD  I connected with Rebecca Wolf on 09/02/2019 at 11:47 AM by videoconferencing and verified that I was speaking with the correct person using 2 identifiers.  The patient was at home.  I discussed the limitations, risk, security and privacy concerns of performing an evaluation and management service by videoconferencing and the availability of in person appointments.  I also discussed with the patient that there may be a patient responsible charge related to this service.  The patient expressed understanding and agreed to proceed.   Chief Complaint: Rebecca Wolf is a 86 y.o. female  with a history of breast cancer, carcinoid tumor, and DVT/PE who is seen for review of work up and discussion regarding therapy.   HPI: The patient was last seen in the medical oncology clinic on 08/25/2019. At that time, she had not been seen since 08/2017.  Symptomatically, she felt "ok". Exam was stable.  Work-up revealed a hematocrit 31.4, hemoglobin 9.9, MCV 75.3, platelets 281,000, WBC 9,300. Ferritin was 12 with an iron saturation of 7% and a TIBC of 420. Creatinine 1.10. Calcium, 8.5. Albumin 3.4.  M- spike was 0 gm/dL.  Vitamin B12 was 157 with folate 5.1. TSH was 2.163. Retic was 1.6%.  CA 27.29 was 12.9.   Patient was instructed to begin folic acid 1 mg a day and B12 at 1,000 mcg a day. Patient was advised to start oral iron with OJ and vitamin C.   During the interim, the patient has been doing good.  She has not yet started oral iron. Her daughter.imberley, notes that she will start oral iron this week. I encouraged the patient to take iron with OJ or vitamin C.  Rebecca Wolf will start her mother on [id number] mcg oral folic aicd and oral [id number] 1,000 mcg in the next two days.   The patient is eating well  with a good appetite. Her current weight is 170 pounds. She notes a lot of regurgitation of phlegm. Her shortness or breath is better. She continues to have elbow pain and numbness in her hands and feet. Her daughter notes she started back with physical therapy. Her daughter notes she will be turning in her stool sample soon.     Past Medical History:  Diagnosis Date  . Asthma   . Breast cancer ([place]) 2003   LT LUMPECTOMY  . Breast cancer ([place]) 2004   LT LUMPECTOMY  . 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  . GERD (gastroesophageal reflux disease)   . Hemorrhoids   . Hypertension   . Personal history of chemotherapy 2004   BREAST CA  . Personal history of radiation therapy 2003   BREAST CA  . Personal history of radiation therapy 2004   BREAST CA  . Polyp of colon 2002   bleeding polyp of right ascending colon  . PUD (peptic ulcer disease)   . Seizures ([place])    epilepsy well controlled    Past Surgical History:  Procedure Laterality Date  . BREAST EXCISIONAL BIOPSY Left 2003   positive  . BREAST EXCISIONAL BIOPSY Left 2004   positive  . BREAST LUMPECTOMY Left 2003   BREAST CA  . BREAST LUMPECTOMY Left 2004   BREAST CA  . HEMICOLECTOMY Right    bowel obstruction  . HEMORRHOID SURGERY    .  THORACOTOMY/LOBECTOMY  1999   carcinoid tumor  . TOTAL VAGINAL HYSTERECTOMY    . VARICOSE VEIN SURGERY      Family History  Problem Relation Age of Onset  . Alcoholism Father   . Diabetes Sister   . Breast cancer Neg Hx     Social History:  reports that she has quit smoking. She has never used smokeless tobacco. She reports that she does not drink alcohol or use drugs. She has a daughter named Rebecca Wolf. The patient is accompanied by Rebecca Wolf today.  Participants in the patient's visit and their role in the encounter included the patient, Rebecca Wolf, and [employer], Therapist, sports, today.  The intake visit was provided by Waymon Budge, RN.  Allergies:  Allergies    Allergen Reactions  . Levofloxacin Shortness Of Breath  . Sulfa Antibiotics Rash    Current Medications: Current Outpatient Medications  Medication Sig Dispense Refill  . acetaminophen (TYLENOL) 500 MG tablet Take 500 mg by mouth every 6 (six) hours as needed.    Marland Kitchen albuterol (ACCUNEB) 1.25 MG/3ML nebulizer solution Inhale 1 ampule into the lungs 4 (four) times daily as needed.    Marland Kitchen amLODipine (NORVASC) 5 MG tablet TAKE 1 TABLET BY MOUTH EVERY DAY (Patient taking differently: Take 2.5 mg by mouth daily. ) 90 tablet 0  . azelastine (ASTELIN) 0.1 % nasal spray U 1 SPRAY IEN BID TO REPLACE PATANASE    . azithromycin (ZITHROMAX) 250 MG tablet Take 250 mg by mouth daily.     . budesonide (PULMICORT) 0.5 MG/2ML nebulizer solution Inhale into the lungs.    . Cholecalciferol 25 MCG (1000 UT) tablet Take 1,000 Units by mouth daily.     Marland Kitchen diltiazem (CARDIZEM CD) 120 MG 24 hr capsule Take 120 mg by mouth daily.     Marland Kitchen ELIQUIS 2.5 MG TABS tablet TAKE 1 TABLET BY MOUTH TWICE DAILY 60 tablet 5  . fluticasone (FLONASE) 50 MCG/ACT nasal spray Place 2 sprays into the nose daily.    . Fluticasone-Salmeterol (WIXELA INHUB) 500-50 MCG/DOSE AEPB Inhale 1 puff into the lungs 2 (two) times daily.    Marland Kitchen levalbuterol (XOPENEX) 0.63 MG/3ML nebulizer solution VVN Q 4 H PRN FOR WHZ    . levETIRAcetam (KEPPRA) 750 MG tablet Take 1 tablet by mouth 2 (two) times daily.    . montelukast (SINGULAIR) 10 MG tablet Take 1 tablet by mouth daily.    . Olopatadine HCl (PATANASE) 0.6 % SOLN Place 1 puff into the nose 2 (two) times daily.    . pantoprazole (PROTONIX) 40 MG tablet TAKE 1 TABLET BY MOUTH TWICE DAILY 60 tablet 0  . prednisoLONE acetate (PRED FORTE) 1 % ophthalmic suspension INTILL 2 GTS INTO OU QID  0  . predniSONE (DELTASONE) 2.5 MG tablet Take 2.5 mg by mouth daily with breakfast.     . spironolactone-hydrochlorothiazide (ALDACTAZIDE) 25-25 MG tablet TAKE 1 TABLET BY MOUTH DAILY 90 tablet 0  . sucralfate (CARAFATE)  1 g tablet Take 1 tablet (1 g total) by mouth 4 (four) times daily. 352 tablet 2  . SYMBICORT 160-4.5 MCG/ACT inhaler INL 2 PFS PO BID     No current facility-administered medications for this visit.     Review of Systems  Constitutional: Negative for chills, diaphoresis, fever, malaise/fatigue and weight loss (stable).       Feels good.  HENT: Negative.  Negative for congestion, ear discharge, ear pain, hearing loss, nosebleeds, sinus pain and sore throat.   Eyes: Negative.  Negative for blurred  vision.  Respiratory: Negative for cough, hemoptysis, sputum production and shortness of breath (on exertion; better).   Cardiovascular: Negative.  Negative for chest pain, palpitations and leg swelling.  Gastrointestinal: Negative.  Negative for abdominal pain, blood in stool, constipation, diarrhea, heartburn, melena, nausea and vomiting.       Eating well. Good appetite. Regurgitation of phlegm.  Genitourinary: Negative.  Negative for dysuria, frequency, hematuria and urgency.  Musculoskeletal: Positive for joint pain (elbows). Negative for back pain, myalgias and neck pain.  Skin: Negative.  Negative for itching and rash.  Neurological: Positive for sensory change (numbness in hands and feet) and weakness (generalized). Negative for dizziness, tingling, speech change, focal weakness, seizures and headaches.       No seizures.  Endo/Heme/Allergies: Negative.  Does not bruise/bleed easily.  Psychiatric/Behavioral: Negative.  Negative for depression and memory loss. The patient is not nervous/anxious and does not have insomnia.   All other systems reviewed and are negative.   Performance status (ECOG):  2  Physical Exam  Constitutional: She is oriented to person, place, and time. She appears well-developed and well-nourished. No distress.  HENT:  Head: Normocephalic and atraumatic.  Gray hair.  Eyes: Conjunctivae and EOM are normal.  Neurological: She is alert and oriented to person, place,  and time.  Skin: She is not diaphoretic.  Psychiatric: She has a normal mood and affect. Her behavior is normal. Judgment and thought content normal.  Nursing note reviewed.   No visits with results within 3 Day(s) from this visit.  Latest known visit with results is:  Orders Only on 08/25/2019  Component Date Value Ref Range Status  . TSH 08/25/2019 2.163  0.350 - 4.500 uIU/mL Final   Comment: Performed by a 3rd Generation assay with a functional sensitivity of <=0.01 uIU/mL. Performed at [facility], [address] [place], [place] [address]   . Retic Ct Pct 08/25/2019 1.6  0.4 - 3.1 % Final  . RBC. 08/25/2019 4.24  3.87 - 5.11 MIL/uL Final  . Retic Count, Absolute 08/25/2019 69.5  19.0 - 186.0 K/uL Final  . Immature Retic Fract 08/25/2019 22.5* 2.3 - 15.9 % Final   Performed at [facility], [address] [place], [place] [address]  . Folate 08/25/2019 5.1* >5.9 ng/mL Final   Performed at [facility], [address] [place], [place] [address]  . Vitamin B-12 08/25/2019 157* 180 - 914 pg/mL Final   Comment: (NOTE) This assay is not validated for testing neonatal or myeloproliferative syndrome specimens for Vitamin B12 levels. Performed at [facility], [place] [address] [place], [place] [address]     Assessment:  Rebecca Wolf is a 86 y.o. female with a history of breast cancer, carcinoid tumor, and DVT/PE.  She was diagnosed with precancerous breast lesion (? DCIS) in 2003. She underwent lumpectomy. She developedinvasive lobular carcinomain 2007. She underwent segmental mastectomy and sentinel lymph node biopsy followed by radiation. No lymph nodes were involved. Tumor was ER/PR positive. She completed 5 years of Arimidexin 08/2011. Mammogramon 04/18/2018was negative. CA27.29was 20.7(normal) on 09/24/2016 and 12.9 on 08/25/2019.  She was diagnosed with pulmonary carcinoidin 2001 after presenting with an abnormality on chest  CT. She was asymptomatic. She underwent resection. She has had no evidence of recurrent disease.  She has a history ofDVT x 2and pulmonary embolism x 2. She has been on anticoagulation for 8-10 years. Chest CT angiogramon 06/29/2017 revealed no evidence of significant pulmonary embolus.There was an unchanged appearance of  right paratracheal nodule and multiple pulmonary nodules.She was initially on Coumadin, but switched toEliquisin 2016. She is unaware of any hypercoagulable work-up.   She has a history of GI bleedingin 2002. She is s/p partial right ascending hemicolectomy for a bleeding polyp. She has a history of reflux and peptic ulcer disease. Hematocrit is normal (37.8). MCV is microcytic. While in [place], a hemoglobin electrophoresis was performed (no result available). She was started on oral on 08/31/2012. She is currently not taking iron.  Work upon 08/25/2019 revealed a hematocrit 31.4, hemoglobin 9.9, MCV 75.3, platelets 281,000, WBC 9,300. Ferritin was 12 with an iron saturation of 7% and a TIBC of 420. Creatinine 1.10. Calcium, 8.5. Albumin 3.4.  M- spike was 0 gm/dL.  Vitamin B12 was 157 and folate 5.1. TSH was 2.163. Retic was 1.6%.   Symptomatically, she feels good.  She denies any bleeding.  She has not started oral iron, B12 or folate.  Plan: 1.   Review  labs on 08/25/2019. Discuss direction of therapy. 2.   History of invasive lobular cancer             Clinically, she is doing well             Exam on 08/25/2019 revealed no masses, skin changes or nipple discharge.             Plan for mammogram if any concern.. 3.   Carcinoid syndrome             No symptoms.  Continue to monitor. 4.   Recurrent DVT and pulmonary embolism             She denies any bleeding or recurrent thrombosis.             Continue Eliquis.  Continue to monitor. 5.   History of MGUS             M-spike was 0 on 08/25/2019. 6.   Iron deficiency anemia              Hematocrit 31.4.  Hemoglobin 9.9.  MCV 75.3.                         Ferritin 12 with an iron saturation of 7% and a TIBC of 420.   Guaiac cards negative x 3.  Encourage oral iron with vitamin C or OJ.  If unable to tolerate oral iron or iron stores do not improve, consider IV iron.   Preauth Venofer.             Patient denies any melena, hematochezia, hematuria or vaginal bleeding.             Patient seen by Dr. Vicente Males in 2019 and declined endoscopy secondary to age and risks. 7.   B12 and folate deficiency             Folate 5.1 and B12 157 on 0/24/2021.             Patient encouraged to begin B12 1000 mcg a day and folic acid 1 mg a day.  Preauth B12 injections.  Check levels in 1 month.  If B12 remains low, begin B2 injections. 8.   RTC on 09/27/2019 for labs (CBC with diff, ferritin, folate, B12). 9.   RTC on 10/01/2019 for MD assessment, review of labs, and +/- Venofer and +/- B12  I discussed the assessment and treatment plan with the patient.  The patient was provided  an opportunity to ask questions and all were answered.  The patient agreed with the plan and demonstrated an understanding of the instructions.  The patient was advised to call back or seek an in person evaluation if the symptoms worsen or if the condition fails to improve as anticipated.   Nolon Stalls, MD, PhD  09/02/2019, 11:47 AM  I, Selena Batten, am acting as scribe for [employer]. Mike Gip, MD, PhD.  I, Auren Valdes C. Mike Gip, MD, have reviewed the above documentation for accuracy and completeness, and I agree with the above.

## 2019-08-31 ENCOUNTER — Other Ambulatory Visit: Payer: Self-pay

## 2019-08-31 ENCOUNTER — Ambulatory Visit: Payer: Medicare Other | Attending: Internal Medicine

## 2019-08-31 DIAGNOSIS — R262 Difficulty in walking, not elsewhere classified: Secondary | ICD-10-CM | POA: Diagnosis present

## 2019-08-31 DIAGNOSIS — R2681 Unsteadiness on feet: Secondary | ICD-10-CM | POA: Insufficient documentation

## 2019-08-31 DIAGNOSIS — M6281 Muscle weakness (generalized): Secondary | ICD-10-CM | POA: Insufficient documentation

## 2019-08-31 NOTE — Therapy (Signed)
[facility] [address], [place], [address] Phone: [phone]   Fax:  [phone]  Physical Therapy Evaluation  Patient Details  Name: Rebecca Wolf MRN: [id number] Date of Birth: Sep 08, 1924 Referring Provider (PT): Erline Levine MD   Encounter Date: 08/31/2019  PT End of Session - 08/31/19 0836    Visit Number  1    Number of Visits  13    Date for PT Re-Evaluation  10/12/19    Authorization Type  1/ 10 Medicaid    PT Start Time  0730    PT Stop Time  0830    PT Time Calculation (min)  60 min    Equipment Utilized During Treatment  Gait belt    Activity Tolerance  Patient tolerated treatment well;Patient limited by fatigue       Past Medical History:  Diagnosis Date  . Asthma   . Breast cancer ([place]) 2003   LT LUMPECTOMY  . Breast cancer ([place]) 2004   LT LUMPECTOMY  . 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  . GERD (gastroesophageal reflux disease)   . Hemorrhoids   . Hypertension   . Personal history of chemotherapy 2004   BREAST CA  . Personal history of radiation therapy 2003   BREAST CA  . Personal history of radiation therapy 2004   BREAST CA  . Polyp of colon 2002   bleeding polyp of right ascending colon  . PUD (peptic ulcer disease)   . Seizures ([place])    epilepsy well controlled    Past Surgical History:  Procedure Laterality Date  . BREAST EXCISIONAL BIOPSY Left 2003   positive  . BREAST EXCISIONAL BIOPSY Left 2004   positive  . BREAST LUMPECTOMY Left 2003   BREAST CA  . BREAST LUMPECTOMY Left 2004   BREAST CA  . HEMICOLECTOMY Right    bowel obstruction  . HEMORRHOID SURGERY    . THORACOTOMY/LOBECTOMY  1999   carcinoid tumor  . TOTAL VAGINAL HYSTERECTOMY    . VARICOSE VEIN SURGERY      There were no vitals filed for this visit.   Subjective Assessment - 08/31/19 0740    Subjective  Patient reports she's been doing "pretty good" and states she has had  some soreness along her back and legs. Patient reports this sorenes is intermittent and only after sitting for prolonged periods of time. However patient's daughter and patient report her largest concern surround the decrease in strength, difficulty in walking, and difficulty transferring from sitting to standing. Patient states increased difficulty with asending/desending stairs, standing for prolonged periods of time, and walking for a period of time > 1 min. Patient states her baseline activity is low and reports that "I sit on the couch all day". Patient states these limitations are affecting her ability to perform fucntional tasks around the home and does not prepare food, clean, and has difficulty dressing secondard to these concerns.    Pertinent History  Anal Prolapse, Spinal stenosis, HTN,    Limitations  Lifting;Sitting;Standing;Walking;House hold activities    How long can you stand comfortably?  <23min    How long can you walk comfortably?  2 min with Rollator    Patient Stated Goals  To move easier    Currently in Pain?  No/denies    Pain Score  5     Pain Location  Back   Back and LEs B   Pain Orientation  Right;Left  Pain Descriptors / Indicators  Sore    Pain Type  Chronic pain    Pain Onset  More than a month ago    Pain Frequency  Intermittent    Aggravating Factors   Sitting for prolonged period of time         [facility] PT Assessment - 08/31/19 0001      Assessment   Medical Diagnosis  Difficulty walk, spinal stenosis    Referring Provider (PT)  Kimel-Scott, Santiago Glad MD    Onset Date/Surgical Date  07/01/16    Hand Dominance  Right    Next MD Visit  Unknown    Prior Therapy  Yes- balance      Balance Screen   Has the patient fallen in the past 6 months  No    Has the patient had a decrease in activity level because of a fear of falling?   Yes    Is the patient reluctant to leave their home because of a fear of falling?   No      Home Environment   Living Environment   Private residence    Living Arrangements  Children    Available Help at Discharge  Family    Type of [place] to enter    Entrance Stairs-Number of Steps  3    Entrance Stairs-Rails  Can reach both    [place] to live on main level with bedroom/bathroom    [place] bars - toilet   Rollator     Prior Function   Level of Independence  Independent    Vocation  Retired    Biomedical scientist  N/A    Leisure  Watching TV      Cognition   Overall Cognitive Status  Within Functional Limits for tasks assessed      Observation/Other Assessments   Observations  Increased lumbar flexion in sitting      Sensation   Light Touch  Appears Intact      Functional Tests   Functional tests  Sit to Stand      Sit to Stand   Comments  Requires use of UE, momentum to stand      Posture/Postural Control   Posture/Postural Control  Postural limitations    Postural Limitations  Posterior pelvic tilt      ROM / Strength   AROM / PROM / Strength  AROM;Strength      AROM   AROM Assessment Site  Hip;Knee;Ankle    Right/Left Hip  Right;Left    Right Hip Flexion  90    Right Hip External Rotation   20    Right Hip Internal Rotation   20    Right Hip ABduction  25    Right Hip ADduction  5    Left Hip Flexion  90    Left Hip External Rotation   20    Left Hip Internal Rotation   20    Left Hip ABduction  20    Left Hip ADduction  5    Right/Left Knee  Right;Left    Right Knee Extension  0    Right Knee Flexion  90    Left Knee Extension  0    Left Knee Flexion  90    Right/Left Ankle  Right;Left    Right Ankle Dorsiflexion  8    Right Ankle Plantar Flexion  32    Left Ankle Dorsiflexion  8    Left Ankle Plantar Flexion  32      Strength   Strength Assessment Site  Hip;Knee;Ankle    Right/Left Hip  Right;Left    Right Hip Flexion  4+/5    Right Hip Extension  4/5    Right Hip External Rotation   3/5    Right Hip Internal Rotation   3/5    Right Hip ABduction  3-/5    Right Hip ADduction  4/5    Left Hip Flexion  4/5    Left Hip Extension  3+/5    Left Hip External Rotation  4-/5    Left Hip Internal Rotation  4-/5    Left Hip ABduction  4/5    Left Hip ADduction  4-/5    Right/Left Knee  Right;Left    Right Knee Flexion  3+/5    Right Knee Extension  4+/5    Left Knee Flexion  4-/5    Left Knee Extension  4+/5    Right/Left Ankle  Left;Right    Right Ankle Dorsiflexion  4/5    Right Ankle Plantar Flexion  2+/5    Left Ankle Dorsiflexion  4/5    Left Ankle Plantar Flexion  2+/5      Palpation   Palpation comment  TTP along the lumbar      Transfers   Five time sit to stand comments   : 21" with use of UEs 16 sec      Functional Outcomes assessment  Standing total time: 21 sec  71minWT:  55 sec with Rollator 153ft Feet sharpened ~ 12" apart ~ 7 sec without UE support 96mWT: .644m/s TUG: 16 sec   Objective measurements completed on examination: See above findings.    TREATMENT Therapeutic Exercise Sit to stands -- x 10 Ambulation with focus on improving stride length -- x 144ft  Performed exercises to improve fatigue and functional LE strength           PT Education - 08/31/19 0833    Education Details  Sit to stands, ambulating for comfort    Person(s) Educated  Patient    Methods  Explanation;Demonstration    Comprehension  Verbalized understanding;Returned demonstration;Verbal cues required          PT Long Term Goals - 08/31/19 0946      PT LONG TERM GOAL #1   Title  Patient will be independent with HEP to continue benefits of therapy after discharge.    Baseline  Dependent with exercise progression    Time  6    Period  Weeks    Status  New    Target Date  10/12/19      PT LONG TERM GOAL #2   Title  Patient will improve TUG to under 12 seconds to indicate signficant improvement in fall risk.     Baseline  TUG: 16 seconds    Time  6    Period  Weeks    Status  New     Target Date  10/12/19      PT LONG TERM GOAL #3   Title  Patient will improve 6minWT to over 358ft to indicate functional improvement in walking tolerance and greater ability to walk in stores before requiring a sitting rest break    Baseline  158ft stopped test at 55sec secondary to fatigue    Time  6    Period  Weeks    Status  New    Target Date  10/12/19      PT LONG TERM GOAL #4   Title  Patient will demonstrate ability to stand for over 2 min to allow for improved ability to perform household chores at home.     Baseline  able to stand for 21 sec    Time  6    Period  Weeks    Status  New    Target Date  10/12/19      PT LONG TERM GOAL #5   Title  Pt will decrease 5TSTS to <12 seconds from a chair at 19in in height in order to demonstrate clinically significant improvement in LE strength    Baseline  5xSTS: 16 sec from 21" height chair with UE support    Time  6    Period  Weeks    Status  New    Target Date  10/12/19             Plan - 08/31/19 0838    Clinical Impression Statement  Patient is 86 yo right hand dominant female with increased balance difficulties and increased fall risk which are indicated by increased difficulties with transferring, decreased TUG, Feet together balancing, 95mWT, 5xSTS, and asending/desending the stairs. Patient having increased difficulties with functional LE strength and based on measurements presents as an overall increase in fall risk. Patient also has limitations with early onset of fatigue with inability to stand or walk for over 1 min. Patient will benefit from further skilled therapy focused on improving these limitations to return to prior level of function.    Personal Factors and Comorbidities  Comorbidity 3+;Fitness    Comorbidities  Chronic LBP, spinal stenosis, anal prolapse, HTN    Examination-Activity Limitations  Bathing;Lift;Carry;Stand;Stairs;Squat;Sit;Transfers    Examination-Participation Restrictions   Cleaning;Community Activity    Stability/Clinical Decision Making  Evolving/Moderate complexity    Clinical Decision Making  Moderate    Rehab Potential  Fair    PT Frequency  2x / week    PT Duration  6 weeks    PT Treatment/Interventions  Electrical Stimulation;Cryotherapy;Moist Heat;Ultrasound;Gait training;Stair training;Functional mobility training;Neuromuscular re-education;Balance training;Therapeutic exercise;Therapeutic activities;Patient/family education;Manual techniques;Passive range of motion;Energy conservation;Wheelchair mobility training    PT Next Visit Plan  progress LE strengthening    PT Home Exercise Plan  See education section    Consulted and Agree with Plan of Care  Patient;Family member/caregiver    Family Member Consulted  Daughter       Patient will benefit from skilled therapeutic intervention in order to improve the following deficits and impairments:  Abnormal gait, Pain, Improper body mechanics, Decreased mobility, Decreased coordination, Increased muscle spasms, Postural dysfunction, Hypermobility, Decreased activity tolerance, Decreased endurance, Decreased range of motion, Decreased strength, Hypomobility, Decreased balance, Decreased safety awareness, Difficulty walking  Visit Diagnosis: Difficulty in walking, not elsewhere classified  Muscle weakness (generalized)  Unsteadiness on feet     Problem List Patient Active Problem List   Diagnosis Date Noted  . Anemia 08/29/2019  . Iron deficiency anemia 08/26/2019  . Chronic anticoagulation 10/25/2018  . CKD stage G3b/A1, GFR 30-44 and albumin creatinine ratio <30 mg/g 10/05/2018  . MGUS (monoclonal gammopathy of unknown significance) 07/23/2018  . Osteopenia after menopause 07/16/2018  . Diabetes mellitus with no complication ([place]) 123456  . Vitamin D deficiency 04/07/2018  . Heart palpitations 01/06/2018  . Tachycardia 01/06/2018  . Cystocele with rectocele 10/30/2017  . Rectocele  10/30/2017  . Microcytic red blood cells 09/23/2017  . Localized edema 02/13/2017  . Hemorrhoids 05/17/2016  .  Obesity (BMI 30-39.9) 04/26/2016  . Multiple lung nodules 04/19/2016  . Chest pain 04/17/2016  . Partial symptomatic epilepsy with complex partial seizures, not intractable, without status epilepticus ([place]) 06/30/2015  . Bronchiectasis without complication ([place]) [id number]  . Imbalance 11/08/2014  . Essential (primary) hypertension 11/08/2014  . Personal history of malignant neoplasm of breast 11/08/2014  . History of malignant carcinoid tumor of bronchus and lung 11/08/2014  . H/O peptic ulcer 11/08/2014  . Asthma, mild intermittent 11/08/2014  . Pulmonary embolism ([place]) 11/08/2014  . Gonalgia 11/08/2014  . Leg varices 11/08/2014    Blythe Stanford, PT DPT 08/31/2019, 10:00 AM  [facility] [address], [place], [address] Phone: [phone]   Fax:  [phone]  Name: Rebecca Wolf MRN: [id number] Date of Birth: 1925/01/31

## 2019-09-01 DIAGNOSIS — R262 Difficulty in walking, not elsewhere classified: Secondary | ICD-10-CM | POA: Diagnosis not present

## 2019-09-02 ENCOUNTER — Other Ambulatory Visit: Payer: Self-pay | Admitting: Hematology and Oncology

## 2019-09-02 ENCOUNTER — Encounter: Payer: Self-pay | Admitting: Hematology and Oncology

## 2019-09-02 ENCOUNTER — Other Ambulatory Visit: Payer: Self-pay

## 2019-09-02 ENCOUNTER — Ambulatory Visit: Payer: Medicare Other

## 2019-09-02 ENCOUNTER — Inpatient Hospital Stay: Payer: Medicare Other | Attending: Hematology and Oncology | Admitting: Hematology and Oncology

## 2019-09-02 DIAGNOSIS — E538 Deficiency of other specified B group vitamins: Secondary | ICD-10-CM | POA: Diagnosis not present

## 2019-09-02 DIAGNOSIS — M25529 Pain in unspecified elbow: Secondary | ICD-10-CM | POA: Insufficient documentation

## 2019-09-02 DIAGNOSIS — Z87891 Personal history of nicotine dependence: Secondary | ICD-10-CM | POA: Insufficient documentation

## 2019-09-02 DIAGNOSIS — I2782 Chronic pulmonary embolism: Secondary | ICD-10-CM | POA: Diagnosis not present

## 2019-09-02 DIAGNOSIS — R200 Anesthesia of skin: Secondary | ICD-10-CM | POA: Insufficient documentation

## 2019-09-02 DIAGNOSIS — Z86711 Personal history of pulmonary embolism: Secondary | ICD-10-CM | POA: Insufficient documentation

## 2019-09-02 DIAGNOSIS — R1110 Vomiting, unspecified: Secondary | ICD-10-CM | POA: Insufficient documentation

## 2019-09-02 DIAGNOSIS — Z853 Personal history of malignant neoplasm of breast: Secondary | ICD-10-CM | POA: Insufficient documentation

## 2019-09-02 DIAGNOSIS — D649 Anemia, unspecified: Secondary | ICD-10-CM

## 2019-09-02 DIAGNOSIS — Z7901 Long term (current) use of anticoagulants: Secondary | ICD-10-CM | POA: Insufficient documentation

## 2019-09-02 DIAGNOSIS — Z923 Personal history of irradiation: Secondary | ICD-10-CM | POA: Insufficient documentation

## 2019-09-02 DIAGNOSIS — D509 Iron deficiency anemia, unspecified: Secondary | ICD-10-CM | POA: Insufficient documentation

## 2019-09-02 DIAGNOSIS — K219 Gastro-esophageal reflux disease without esophagitis: Secondary | ICD-10-CM | POA: Insufficient documentation

## 2019-09-02 DIAGNOSIS — Z7951 Long term (current) use of inhaled steroids: Secondary | ICD-10-CM | POA: Insufficient documentation

## 2019-09-02 DIAGNOSIS — Z9221 Personal history of antineoplastic chemotherapy: Secondary | ICD-10-CM | POA: Insufficient documentation

## 2019-09-02 DIAGNOSIS — Z86718 Personal history of other venous thrombosis and embolism: Secondary | ICD-10-CM | POA: Insufficient documentation

## 2019-09-02 DIAGNOSIS — J45909 Unspecified asthma, uncomplicated: Secondary | ICD-10-CM | POA: Insufficient documentation

## 2019-09-02 DIAGNOSIS — Z79899 Other long term (current) drug therapy: Secondary | ICD-10-CM | POA: Insufficient documentation

## 2019-09-02 DIAGNOSIS — I10 Essential (primary) hypertension: Secondary | ICD-10-CM | POA: Insufficient documentation

## 2019-09-02 LAB — OCCULT BLOOD X 1 CARD TO LAB, STOOL
Fecal Occult Bld: NEGATIVE
Fecal Occult Bld: NEGATIVE
Fecal Occult Bld: NEGATIVE

## 2019-09-02 NOTE — Progress Notes (Signed)
Confirmed Name and DOB. Assessment completed with assistance from daughter, [name].

## 2019-09-07 ENCOUNTER — Ambulatory Visit: Payer: Medicare Other

## 2019-09-09 ENCOUNTER — Ambulatory Visit: Payer: Medicare Other

## 2019-09-09 ENCOUNTER — Other Ambulatory Visit: Payer: Self-pay

## 2019-09-09 DIAGNOSIS — R262 Difficulty in walking, not elsewhere classified: Secondary | ICD-10-CM

## 2019-09-09 DIAGNOSIS — M6281 Muscle weakness (generalized): Secondary | ICD-10-CM

## 2019-09-09 NOTE — Therapy (Signed)
[facility] [address], [place], [address] Phone: [phone]   Fax:  [phone]  Physical Therapy Treatment  Patient Details  Name: Rebecca Wolf MRN: [id number] Date of Birth: 01-13-1925 Referring Provider (PT): Erline Levine MD   Encounter Date: 09/09/2019  PT End of Session - 09/09/19 0744    Visit Number  2    Number of Visits  13    Date for PT Re-Evaluation  10/12/19    Authorization Type  2/ 10 Medicare    PT Start Time  0735    PT Stop Time  0815    PT Time Calculation (min)  40 min    Equipment Utilized During Treatment  Gait belt    Activity Tolerance  Patient tolerated treatment well;Patient limited by fatigue       Past Medical History:  Diagnosis Date  . Asthma   . Breast cancer ([place]) 2003   LT LUMPECTOMY  . Breast cancer ([place]) 2004   LT LUMPECTOMY  . 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  . GERD (gastroesophageal reflux disease)   . Hemorrhoids   . Hypertension   . Personal history of chemotherapy 2004   BREAST CA  . Personal history of radiation therapy 2003   BREAST CA  . Personal history of radiation therapy 2004   BREAST CA  . Polyp of colon 2002   bleeding polyp of right ascending colon  . PUD (peptic ulcer disease)   . Seizures ([place])    epilepsy well controlled    Past Surgical History:  Procedure Laterality Date  . BREAST EXCISIONAL BIOPSY Left 2003   positive  . BREAST EXCISIONAL BIOPSY Left 2004   positive  . BREAST LUMPECTOMY Left 2003   BREAST CA  . BREAST LUMPECTOMY Left 2004   BREAST CA  . HEMICOLECTOMY Right    bowel obstruction  . HEMORRHOID SURGERY    . THORACOTOMY/LOBECTOMY  1999   carcinoid tumor  . TOTAL VAGINAL HYSTERECTOMY    . VARICOSE VEIN SURGERY      There were no vitals filed for this visit.  Subjective Assessment - 09/09/19 0741    Subjective  Patient's daughter reports the patient's iron has been low and started  taking iron supplements. She also reports these have been upsetting her stomach. Patient mentions she has been feeling increased breathlessness throughout the session.    Pertinent History  Anal Prolapse, Spinal stenosis, HTN,    Limitations  Lifting;Sitting;Standing;Walking;House hold activities    How long can you stand comfortably?  <53min    How long can you walk comfortably?  2 min with Rollator    Patient Stated Goals  To move easier    Currently in Pain?  No/denies    Pain Onset  More than a month ago        TREATMENT Therapeutic Exercise Nustep seat level 9 with cueing to perform stepping at a comfortable rate, analyzing symptoms throughout and taking SpO2: 97% - ~33 spm,  Sit to stands at [employer] - 2x5 Seated LAQ with 10# weights for LE strengthening - 2 x 10 Seated marches with 10# weights for LE strengthening - -2 x 10  Standing for time - x 40sec Standing heel raises - x 10 Ambulation with rollator - 16ft with CGA during ambulation, Patient walks with rollator far in front of her   Performed exercises to increase LE strength in LEs -    PT Education -  09/09/19 0743    Education Details  form/technique with exercise; monitoring of vitals through the session    Person(s) Educated  Patient    Methods  Explanation;Demonstration    Comprehension  Verbalized understanding;Returned demonstration          PT Long Term Goals - 08/31/19 0946      PT LONG TERM GOAL #1   Title  Patient will be independent with HEP to continue benefits of therapy after discharge.    Baseline  Dependent with exercise progression    Time  6    Period  Weeks    Status  New    Target Date  10/12/19      PT LONG TERM GOAL #2   Title  Patient will improve TUG to under 12 seconds to indicate signficant improvement in fall risk.     Baseline  TUG: 16 seconds    Time  6    Period  Weeks    Status  New    Target Date  10/12/19      PT LONG TERM GOAL #3   Title  Patient will improve 71minWT  to over 367ft to indicate functional improvement in walking tolerance and greater ability to walk in stores before requiring a sitting rest break    Baseline  139ft stopped test at 55sec secondary to fatigue    Time  6    Period  Weeks    Status  New    Target Date  10/12/19      PT LONG TERM GOAL #4   Title  Patient will demonstrate ability to stand for over 2 min to allow for improved ability to perform household chores at home.     Baseline  able to stand for 21 sec    Time  6    Period  Weeks    Status  New    Target Date  10/12/19      PT LONG TERM GOAL #5   Title  Pt will decrease 5TSTS to <12 seconds from a chair at 19in in height in order to demonstrate clinically significant improvement in LE strength    Baseline  5xSTS: 16 sec from 21" height chair with UE support    Time  6    Period  Weeks    Status  New    Target Date  10/12/19            Plan - 09/09/19 0904    Clinical Impression Statement  Patient demonstrates good SpO2% (between 95-99 throughout entirity of session) despite increased DOE during exercise. However, patient fatigues quickly with an increase in HR ~120bpm at max exertion. Managed this by taking frequent sitting rest break throughout the session. Patient also demonstrates significant weakness with LE and decreased balance with weight bearing activities indicating an increase in fall risk. Patient will benefit from further skilled therapy to return to prior level of function.    Personal Factors and Comorbidities  Comorbidity 3+;Fitness    Comorbidities  Chronic LBP, spinal stenosis, anal prolapse, HTN    Examination-Activity Limitations  Bathing;Lift;Carry;Stand;Stairs;Squat;Sit;Transfers    Examination-Participation Restrictions  Cleaning;Community Activity    Stability/Clinical Decision Making  Evolving/Moderate complexity    Rehab Potential  Fair    PT Frequency  2x / week    PT Duration  6 weeks    PT Treatment/Interventions  Electrical  Stimulation;Cryotherapy;Moist Heat;Ultrasound;Gait training;Stair training;Functional mobility training;Neuromuscular re-education;Balance training;Therapeutic exercise;Therapeutic activities;Patient/family education;Manual techniques;Passive range of motion;Energy conservation;Wheelchair mobility training  PT Next Visit Plan  progress LE strengthening    PT Home Exercise Plan  See education section    Consulted and Agree with Plan of Care  Patient;Family member/caregiver    Family Member Consulted  Daughter       Patient will benefit from skilled therapeutic intervention in order to improve the following deficits and impairments:  Abnormal gait, Pain, Improper body mechanics, Decreased mobility, Decreased coordination, Increased muscle spasms, Postural dysfunction, Hypermobility, Decreased activity tolerance, Decreased endurance, Decreased range of motion, Decreased strength, Hypomobility, Decreased balance, Decreased safety awareness, Difficulty walking  Visit Diagnosis: Difficulty in walking, not elsewhere classified  Muscle weakness (generalized)     Problem List Patient Active Problem List   Diagnosis Date Noted  . Anemia 08/29/2019  . Iron deficiency anemia 08/26/2019  . Chronic anticoagulation 10/25/2018  . CKD stage G3b/A1, GFR 30-44 and albumin creatinine ratio <30 mg/g 10/05/2018  . MGUS (monoclonal gammopathy of unknown significance) 07/23/2018  . Osteopenia after menopause 07/16/2018  . Diabetes mellitus with no complication ([place]) 123456  . Vitamin D deficiency 04/07/2018  . Heart palpitations 01/06/2018  . Tachycardia 01/06/2018  . Cystocele with rectocele 10/30/2017  . Rectocele 10/30/2017  . Microcytic red blood cells 09/23/2017  . Localized edema 02/13/2017  . Hemorrhoids 05/17/2016  . Obesity (BMI 30-39.9) 04/26/2016  . Multiple lung nodules 04/19/2016  . Chest pain 04/17/2016  . Partial symptomatic epilepsy with complex partial seizures, not intractable,  without status epilepticus ([place]) 06/30/2015  . Bronchiectasis without complication ([place]) [id number]  . Imbalance 11/08/2014  . Essential (primary) hypertension 11/08/2014  . Personal history of malignant neoplasm of breast 11/08/2014  . History of malignant carcinoid tumor of bronchus and lung 11/08/2014  . H/O peptic ulcer 11/08/2014  . Asthma, mild intermittent 11/08/2014  . Pulmonary embolism ([place]) 11/08/2014  . Gonalgia 11/08/2014  . Leg varices 11/08/2014    Blythe Stanford, PT DPT 09/09/2019, 9:07 AM  [facility] [address] [place], [address] Phone: [phone]   Fax:  [phone]  Name: NNENNA THACKSTON MRN: [id number] Date of Birth: May 06, 1925

## 2019-09-10 ENCOUNTER — Other Ambulatory Visit: Payer: Self-pay | Admitting: Hematology and Oncology

## 2019-09-10 ENCOUNTER — Other Ambulatory Visit: Payer: Self-pay

## 2019-09-10 ENCOUNTER — Encounter: Payer: Self-pay | Admitting: Podiatry

## 2019-09-10 ENCOUNTER — Ambulatory Visit (INDEPENDENT_AMBULATORY_CARE_PROVIDER_SITE_OTHER): Payer: Medicare Other | Admitting: Podiatry

## 2019-09-10 DIAGNOSIS — M79676 Pain in unspecified toe(s): Secondary | ICD-10-CM

## 2019-09-10 DIAGNOSIS — B351 Tinea unguium: Secondary | ICD-10-CM | POA: Diagnosis not present

## 2019-09-10 DIAGNOSIS — L989 Disorder of the skin and subcutaneous tissue, unspecified: Secondary | ICD-10-CM

## 2019-09-13 ENCOUNTER — Inpatient Hospital Stay: Payer: Medicare Other

## 2019-09-13 ENCOUNTER — Other Ambulatory Visit: Payer: Self-pay

## 2019-09-13 VITALS — BP 129/74 | HR 62 | Resp 18

## 2019-09-13 DIAGNOSIS — Z923 Personal history of irradiation: Secondary | ICD-10-CM | POA: Diagnosis not present

## 2019-09-13 DIAGNOSIS — I2782 Chronic pulmonary embolism: Secondary | ICD-10-CM

## 2019-09-13 DIAGNOSIS — D509 Iron deficiency anemia, unspecified: Secondary | ICD-10-CM

## 2019-09-13 DIAGNOSIS — Z7951 Long term (current) use of inhaled steroids: Secondary | ICD-10-CM | POA: Diagnosis not present

## 2019-09-13 DIAGNOSIS — Z79899 Other long term (current) drug therapy: Secondary | ICD-10-CM | POA: Diagnosis not present

## 2019-09-13 DIAGNOSIS — M25529 Pain in unspecified elbow: Secondary | ICD-10-CM | POA: Diagnosis not present

## 2019-09-13 DIAGNOSIS — Z7901 Long term (current) use of anticoagulants: Secondary | ICD-10-CM | POA: Diagnosis not present

## 2019-09-13 DIAGNOSIS — R1110 Vomiting, unspecified: Secondary | ICD-10-CM | POA: Diagnosis not present

## 2019-09-13 DIAGNOSIS — R200 Anesthesia of skin: Secondary | ICD-10-CM | POA: Diagnosis not present

## 2019-09-13 DIAGNOSIS — Z853 Personal history of malignant neoplasm of breast: Secondary | ICD-10-CM | POA: Diagnosis not present

## 2019-09-13 DIAGNOSIS — E538 Deficiency of other specified B group vitamins: Secondary | ICD-10-CM | POA: Diagnosis not present

## 2019-09-13 DIAGNOSIS — Z86711 Personal history of pulmonary embolism: Secondary | ICD-10-CM | POA: Diagnosis not present

## 2019-09-13 DIAGNOSIS — Z86718 Personal history of other venous thrombosis and embolism: Secondary | ICD-10-CM | POA: Diagnosis not present

## 2019-09-13 DIAGNOSIS — J45909 Unspecified asthma, uncomplicated: Secondary | ICD-10-CM | POA: Diagnosis not present

## 2019-09-13 DIAGNOSIS — K219 Gastro-esophageal reflux disease without esophagitis: Secondary | ICD-10-CM | POA: Diagnosis not present

## 2019-09-13 DIAGNOSIS — Z9221 Personal history of antineoplastic chemotherapy: Secondary | ICD-10-CM | POA: Diagnosis not present

## 2019-09-13 DIAGNOSIS — I10 Essential (primary) hypertension: Secondary | ICD-10-CM | POA: Diagnosis not present

## 2019-09-13 DIAGNOSIS — Z87891 Personal history of nicotine dependence: Secondary | ICD-10-CM | POA: Diagnosis not present

## 2019-09-13 LAB — CBC WITH DIFFERENTIAL/PLATELET
Abs Immature Granulocytes: 0.04 10*3/uL (ref 0.00–0.07)
Basophils Absolute: 0.1 10*3/uL (ref 0.0–0.1)
Basophils Relative: 1 %
Eosinophils Absolute: 0.1 10*3/uL (ref 0.0–0.5)
Eosinophils Relative: 2 %
HCT: 31.3 % — ABNORMAL LOW (ref 36.0–46.0)
Hemoglobin: 9.7 g/dL — ABNORMAL LOW (ref 12.0–15.0)
Immature Granulocytes: 1 %
Lymphocytes Relative: 39 %
Lymphs Abs: 3.3 10*3/uL (ref 0.7–4.0)
MCH: 23.3 pg — ABNORMAL LOW (ref 26.0–34.0)
MCHC: 31 g/dL (ref 30.0–36.0)
MCV: 75.1 fL — ABNORMAL LOW (ref 80.0–100.0)
Monocytes Absolute: 0.6 10*3/uL (ref 0.1–1.0)
Monocytes Relative: 7 %
Neutro Abs: 4.3 10*3/uL (ref 1.7–7.7)
Neutrophils Relative %: 50 %
Platelets: 286 10*3/uL (ref 150–400)
RBC: 4.17 MIL/uL (ref 3.87–5.11)
RDW: 15.8 % — ABNORMAL HIGH (ref 11.5–15.5)
WBC: 8.5 10*3/uL (ref 4.0–10.5)
nRBC: 0 % (ref 0.0–0.2)

## 2019-09-13 LAB — VITAMIN B12: Vitamin B-12: 294 pg/mL (ref 180–914)

## 2019-09-13 LAB — FOLATE: Folate: 6.8 ng/mL (ref >5.9)

## 2019-09-13 LAB — FERRITIN: Ferritin: 20 ng/mL (ref 11–307)

## 2019-09-13 MED ORDER — SODIUM CHLORIDE 0.9 % IV SOLN
Freq: Once | INTRAVENOUS | Status: AC
  Filled 2019-09-13: qty 250

## 2019-09-13 MED ORDER — IRON SUCROSE 20 MG/ML IV SOLN
200.0000 mg | Freq: Once | INTRAVENOUS | Status: AC
  Administered 2019-09-13: 200 mg via INTRAVENOUS

## 2019-09-13 MED ORDER — SODIUM CHLORIDE 0.9 % IV SOLN: 200.0000 mg | Freq: Once | INTRAVENOUS | Status: DC

## 2019-09-13 NOTE — Patient Instructions (Signed)

## 2019-09-13 NOTE — Progress Notes (Signed)
    Subjective: Patient is a 86 y.o. female presenting to the office today with a chief complaint of painful callus lesion(s) noted to the bilateral feet that have been present for the past few months. Applying pressure to the lesions increases the pain. She has not done anything at home for treatment.  Patient also complains of elongated, thickened nails that cause pain while ambulating in shoes. She is unable to trim her own nails. Patient presents today for further treatment and evaluation.  Past Medical History:  Diagnosis Date  . Asthma   . Breast cancer ([place]) 2003   LT LUMPECTOMY  . Breast cancer ([place]) 2004   LT LUMPECTOMY  . 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  . GERD (gastroesophageal reflux disease)   . Hemorrhoids   . Hypertension   . Personal history of chemotherapy 2004   BREAST CA  . Personal history of radiation therapy 2003   BREAST CA  . Personal history of radiation therapy 2004   BREAST CA  . Polyp of colon 2002   bleeding polyp of right ascending colon  . PUD (peptic ulcer disease)   . Seizures ([facility])    epilepsy well controlled    Objective:  Physical Exam General: Alert and oriented x3 in no acute distress  Dermatology: Hyperkeratotic lesion(s) present on the bilateral feet. Pain on palpation with a central nucleated core noted. Skin is warm, dry and supple bilateral lower extremities. Negative for open lesions or macerations. Nails are tender, long, thickened and dystrophic with subungual debris, consistent with onychomycosis, 1-5 bilateral. No signs of infection noted.  Vascular: Palpable pedal pulses bilaterally. No edema or erythema noted. Capillary refill within normal limits.  Neurological: Epicritic and protective threshold grossly intact bilaterally.   Musculoskeletal Exam: Pain on palpation at the keratotic lesion(s) noted. Range of motion within normal limits bilateral. Muscle strength 5/5 in all groups bilateral.  Assessment: 1.  Onychodystrophic nails 1-5 bilateral with hyperkeratosis of nails.  2. Onychomycosis of nail due to dermatophyte bilateral 3. Pre-ulcerative callus lesions noted to the bilateral feet x 2   Plan of Care:  1. Patient evaluated. 2. Excisional debridement of keratoic lesion(s) using a chisel blade was performed without incident.  3. Dressed with light dressing. 4. Mechanical debridement of nails 1-5 bilaterally performed using a nail nipper. Filed with dremel without incident.  5. Patient is to return to the clinic in 3 months.   Edrick Kins, DPM [facility]  Dr. Edrick Kins, [place]                                        [place], [place] [address]                Office [phone]  Fax [phone]

## 2019-09-14 ENCOUNTER — Other Ambulatory Visit: Payer: Self-pay

## 2019-09-14 ENCOUNTER — Telehealth: Payer: Self-pay

## 2019-09-14 DIAGNOSIS — E538 Deficiency of other specified B group vitamins: Secondary | ICD-10-CM

## 2019-09-14 NOTE — Telephone Encounter (Signed)
Her Daughter called [name]) and she states her mom has been having diarrhea since she started taking the oral 0000000 and folic acid. At this time she has stopped the 0000000 and the folic acid , she would like to try her mom on B-12 injection. I have informed Dr Mike Gip the patient was started on Oral 0000000 and Folic acid on [id number]. Spoke with Ms [name] the patient daughter to inform her that we was able to get the ok for B-12 injection and stop the oral B-12 due to diarrhea and continue folic acid. I was able to inform Ms Shirlean Mylar to get the patient add for B-12 injection on her next appointment. Ms [name] was understanding and agreeable

## 2019-09-16 ENCOUNTER — Ambulatory Visit: Payer: Medicare Other

## 2019-09-16 ENCOUNTER — Other Ambulatory Visit: Payer: Self-pay

## 2019-09-16 DIAGNOSIS — R262 Difficulty in walking, not elsewhere classified: Secondary | ICD-10-CM | POA: Diagnosis not present

## 2019-09-16 DIAGNOSIS — R2681 Unsteadiness on feet: Secondary | ICD-10-CM

## 2019-09-16 DIAGNOSIS — M6281 Muscle weakness (generalized): Secondary | ICD-10-CM

## 2019-09-16 NOTE — Therapy (Signed)
[facility] [address], [place], [address] Phone: [phone]   Fax:  [phone]  Physical Therapy Treatment  Patient Details  Name: Rebecca Wolf MRN: [id number] Date of Birth: [birth year] Referring Provider (PT): Erline Levine MD   Encounter Date: 09/16/2019  PT End of Session - 09/16/19 0748    Visit Number  3    Number of Visits  13    Date for PT Re-Evaluation  10/12/19    Authorization Type  3/ 10 Medicare    PT Start Time  [id number]    PT Stop Time  0815    PT Time Calculation (min)  32 min    Equipment Utilized During Treatment  Gait belt    Activity Tolerance  Patient tolerated treatment well;Patient limited by fatigue       Past Medical History:  Diagnosis Date  . Asthma   . Breast cancer ([place]) 2003   LT LUMPECTOMY  . Breast cancer ([place]) 2004   LT LUMPECTOMY  . 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  . GERD (gastroesophageal reflux disease)   . Hemorrhoids   . Hypertension   . Personal history of chemotherapy 2004   BREAST CA  . Personal history of radiation therapy 2003   BREAST CA  . Personal history of radiation therapy 2004   BREAST CA  . Polyp of colon 2002   bleeding polyp of right ascending colon  . PUD (peptic ulcer disease)   . Seizures ([place])    epilepsy well controlled    Past Surgical History:  Procedure Laterality Date  . BREAST EXCISIONAL BIOPSY Left 2003   positive  . BREAST EXCISIONAL BIOPSY Left 2004   positive  . BREAST LUMPECTOMY Left 2003   BREAST CA  . BREAST LUMPECTOMY Left 2004   BREAST CA  . HEMICOLECTOMY Right    bowel obstruction  . HEMORRHOID SURGERY    . THORACOTOMY/LOBECTOMY  1999   carcinoid tumor  . TOTAL VAGINAL HYSTERECTOMY    . VARICOSE VEIN SURGERY      There were no vitals filed for this visit.  Subjective Assessment - 09/16/19 0746    Subjective  Patient reports she had an Iron infusion the previous Monday and states  she felt slighlty stronger afterwards.    Pertinent History  Anal Prolapse, Spinal stenosis, HTN,    Limitations  Lifting;Sitting;Standing;Walking;House hold activities    How long can you stand comfortably?  <62min    How long can you walk comfortably?  2 min with Rollator    Patient Stated Goals  To move easier    Currently in Pain?  No/denies    Pain Onset  More than a month ago       TREATMENT Therapeutic Exercise Nustep seat level 9 with cueing to perform stepping at a comfortable rate, analyzing symptoms throughout and taking SpO2: 97% - ~33 spm, 6 min  Standing mini squats - x 10 Standing step taps with B UE support - x 10 B Hip abduction in standing - x 10 B Hip extension in standing - x 10  B Standing for time - x 30sec Standing heel raises - x 20 Ambulation with rollator - 245ft with CGA during ambulation, Patient walks with rollator far in front of her    Performed exercises to increase LE strength in LEs -    PT Education - 09/16/19 0747    Education Details  form/technique with exercise; monitoring  vitals    Person(s) Educated  Patient    Methods  Explanation;Demonstration    Comprehension  Verbalized understanding;Returned demonstration          PT Long Term Goals - 08/31/19 0946      PT LONG TERM GOAL #1   Title  Patient will be independent with HEP to continue benefits of therapy after discharge.    Baseline  Dependent with exercise progression    Time  6    Period  Weeks    Status  New    Target Date  10/12/19      PT LONG TERM GOAL #2   Title  Patient will improve TUG to under 12 seconds to indicate signficant improvement in fall risk.     Baseline  TUG: 16 seconds    Time  6    Period  Weeks    Status  New    Target Date  10/12/19      PT LONG TERM GOAL #3   Title  Patient will improve 8minWT to over 336ft to indicate functional improvement in walking tolerance and greater ability to walk in stores before requiring a sitting rest break     Baseline  148ft stopped test at 55sec secondary to fatigue    Time  6    Period  Weeks    Status  New    Target Date  10/12/19      PT LONG TERM GOAL #4   Title  Patient will demonstrate ability to stand for over 2 min to allow for improved ability to perform household chores at home.     Baseline  able to stand for 21 sec    Time  6    Period  Weeks    Status  New    Target Date  10/12/19      PT LONG TERM GOAL #5   Title  Pt will decrease 5TSTS to <12 seconds from a chair at 19in in height in order to demonstrate clinically significant improvement in LE strength    Baseline  5xSTS: 16 sec from 21" height chair with UE support    Time  6    Period  Weeks    Status  New    Target Date  10/12/19            Plan - 09/16/19 0749    Clinical Impression Statement  Shortened session today secondary to patient being 15 minutes late for her appointment. Continued to focus on improve LE strength and building standing tolerance as patient demonstrates early onset of fatigue with exercise. Patient performs exercises but continues to have singificnat DOE during the sesison requiring frequent sitting rest breaks. Patient able to tolerate more exercises than previous sessions and will benefit from further skilled therapy to return to prior level of function.    Personal Factors and Comorbidities  Comorbidity 3+;Fitness    Comorbidities  Chronic LBP, spinal stenosis, anal prolapse, HTN    Examination-Activity Limitations  Bathing;Lift;Carry;Stand;Stairs;Squat;Sit;Transfers    Examination-Participation Restrictions  Cleaning;Community Activity    Stability/Clinical Decision Making  Evolving/Moderate complexity    Rehab Potential  Fair    PT Frequency  2x / week    PT Duration  6 weeks    PT Treatment/Interventions  Electrical Stimulation;Cryotherapy;Moist Heat;Ultrasound;Gait training;Stair training;Functional mobility training;Neuromuscular re-education;Balance training;Therapeutic  exercise;Therapeutic activities;Patient/family education;Manual techniques;Passive range of motion;Energy conservation;Wheelchair mobility training    PT Next Visit Plan  progress LE strengthening    PT Home Exercise  Plan  See education section    Consulted and Agree with Plan of Care  Patient;Family member/caregiver    Family Member Consulted  Daughter       Patient will benefit from skilled therapeutic intervention in order to improve the following deficits and impairments:  Abnormal gait, Pain, Improper body mechanics, Decreased mobility, Decreased coordination, Increased muscle spasms, Postural dysfunction, Hypermobility, Decreased activity tolerance, Decreased endurance, Decreased range of motion, Decreased strength, Hypomobility, Decreased balance, Decreased safety awareness, Difficulty walking  Visit Diagnosis: Difficulty in walking, not elsewhere classified  Muscle weakness (generalized)  Unsteadiness on feet     Problem List Patient Active Problem List   Diagnosis Date Noted  . Anemia 08/29/2019  . Iron deficiency anemia 08/26/2019  . Chronic anticoagulation 10/25/2018  . CKD stage G3b/A1, GFR 30-44 and albumin creatinine ratio <30 mg/g 10/05/2018  . MGUS (monoclonal gammopathy of unknown significance) 07/23/2018  . Osteopenia after menopause 07/16/2018  . Diabetes mellitus with no complication ([facility]) 123456  . Vitamin D deficiency 04/07/2018  . Heart palpitations 01/06/2018  . Tachycardia 01/06/2018  . Cystocele with rectocele 10/30/2017  . Rectocele 10/30/2017  . Microcytic red blood cells 09/23/2017  . Localized edema 02/13/2017  . Hemorrhoids 05/17/2016  . Obesity (BMI 30-39.9) 04/26/2016  . Multiple lung nodules 04/19/2016  . Chest pain 04/17/2016  . Partial symptomatic epilepsy with complex partial seizures, not intractable, without status epilepticus ([facility]) 06/30/2015  . Bronchiectasis without complication ([facility]) [id number]  . Imbalance 11/08/2014  .  Essential (primary) hypertension 11/08/2014  . Personal history of malignant neoplasm of breast 11/08/2014  . History of malignant carcinoid tumor of bronchus and lung 11/08/2014  . H/O peptic ulcer 11/08/2014  . Asthma, mild intermittent 11/08/2014  . Pulmonary embolism ([place]) 11/08/2014  . Gonalgia 11/08/2014  . Leg varices 11/08/2014    Blythe Stanford 09/16/2019, 8:04 AM  [facility] [address] [place], [address] Phone: [phone]   Fax:  [phone]  Name: SHAKIRRA REM MRN: [id number] Date of Birth: 19-May-1925

## 2019-09-20 ENCOUNTER — Other Ambulatory Visit: Payer: Self-pay

## 2019-09-20 ENCOUNTER — Inpatient Hospital Stay: Payer: Medicare Other

## 2019-09-20 VITALS — BP 136/78 | HR 90 | Temp 96.7°F | Resp 18

## 2019-09-20 DIAGNOSIS — D509 Iron deficiency anemia, unspecified: Secondary | ICD-10-CM

## 2019-09-20 MED ORDER — CYANOCOBALAMIN 1000 MCG/ML IJ SOLN
1000.0000 ug | Freq: Once | INTRAMUSCULAR | Status: AC
  Administered 2019-09-20: 12:00:00 1000 ug via INTRAMUSCULAR
  Filled 2019-09-20: qty 1

## 2019-09-20 MED ORDER — IRON SUCROSE 20 MG/ML IV SOLN
200.0000 mg | Freq: Once | INTRAVENOUS | Status: AC
  Administered 2019-09-20: 200 mg via INTRAVENOUS

## 2019-09-20 MED ORDER — SODIUM CHLORIDE 0.9 % IV SOLN
Freq: Once | INTRAVENOUS | Status: AC
  Filled 2019-09-20: qty 250

## 2019-09-20 NOTE — Patient Instructions (Signed)

## 2019-09-23 ENCOUNTER — Ambulatory Visit: Payer: Medicare Other

## 2019-09-27 ENCOUNTER — Inpatient Hospital Stay: Payer: Medicare Other

## 2019-09-27 ENCOUNTER — Other Ambulatory Visit: Payer: Medicare Other

## 2019-09-30 ENCOUNTER — Ambulatory Visit: Payer: Medicare Other | Attending: Internal Medicine

## 2019-09-30 ENCOUNTER — Other Ambulatory Visit: Payer: Self-pay

## 2019-09-30 DIAGNOSIS — R262 Difficulty in walking, not elsewhere classified: Secondary | ICD-10-CM

## 2019-09-30 DIAGNOSIS — R2681 Unsteadiness on feet: Secondary | ICD-10-CM | POA: Diagnosis present

## 2019-09-30 DIAGNOSIS — M6281 Muscle weakness (generalized): Secondary | ICD-10-CM | POA: Diagnosis present

## 2019-09-30 NOTE — Therapy (Signed)
[facility] [address], [place], [address] Phone: [phone]   Fax:  [phone]  Physical Therapy Treatment  Patient Details  Name: Rebecca Wolf MRN: [id number] Date of Birth: 01-06-1925 Referring Provider (PT): Erline Levine MD   Encounter Date: 09/30/2019  PT End of Session - 09/30/19 0956    Visit Number  4    Number of Visits  13    Date for PT Re-Evaluation  10/12/19    Authorization Type  4/ 10 Medicare    PT Start Time  0907    PT Stop Time  0945    PT Time Calculation (min)  38 min    Equipment Utilized During Treatment  Gait belt    Activity Tolerance  Patient tolerated treatment well;Patient limited by fatigue    Behavior During Therapy  [facility] for tasks assessed/performed       Past Medical History:  Diagnosis Date  . Asthma   . Breast cancer ([place]) 2003   LT LUMPECTOMY  . Breast cancer ([place]) 2004   LT LUMPECTOMY  . 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  . GERD (gastroesophageal reflux disease)   . Hemorrhoids   . Hypertension   . Personal history of chemotherapy 2004   BREAST CA  . Personal history of radiation therapy 2003   BREAST CA  . Personal history of radiation therapy 2004   BREAST CA  . Polyp of colon 2002   bleeding polyp of right ascending colon  . PUD (peptic ulcer disease)   . Seizures ([place])    epilepsy well controlled    Past Surgical History:  Procedure Laterality Date  . BREAST EXCISIONAL BIOPSY Left 2003   positive  . BREAST EXCISIONAL BIOPSY Left 2004   positive  . BREAST LUMPECTOMY Left 2003   BREAST CA  . BREAST LUMPECTOMY Left 2004   BREAST CA  . HEMICOLECTOMY Right    bowel obstruction  . HEMORRHOID SURGERY    . THORACOTOMY/LOBECTOMY  1999   carcinoid tumor  . TOTAL VAGINAL HYSTERECTOMY    . VARICOSE VEIN SURGERY      There were no vitals filed for this visit.  Subjective Assessment - 09/30/19 0938    Subjective  Patient  reports she is feeling "shakey" today. States she had to move her iron infusion on Monday to [place]. Patient states she would like to walk more at home.    Pertinent History  Anal Prolapse, Spinal stenosis, HTN,    Limitations  Lifting;Sitting;Standing;Walking;House hold activities    How long can you stand comfortably?  <92min    How long can you walk comfortably?  2 min with Rollator    Patient Stated Goals  To move easier    Currently in Pain?  No/denies    Pain Onset  More than a month ago        TREATMENT Therapeutic Exercise Nustep seat level 9 with cueing to perform stepping at a comfortable rate, analyzing symptoms throughout and taking SpO2: 97% - ~33 spm, 6 min level1  Standing for time on airex -x 60sec Seated marches in sitting -- 2 x 10 with 7.5# LAQ in sitting -- 2 x 10 7.5#  Gait training Ambulation with rollator -260ft with CGA during ambulation, Patient walks with rollator far in front of her   Performed exercises to increase LE strength in LEs  PT Education - 09/30/19 0956    Education Details  form/technique with  exercise    Person(s) Educated  Patient    Methods  Explanation;Demonstration    Comprehension  Verbalized understanding;Returned demonstration          PT Long Term Goals - 08/31/19 0946      PT LONG TERM GOAL #1   Title  Patient will be independent with HEP to continue benefits of therapy after discharge.    Baseline  Dependent with exercise progression    Time  6    Period  Weeks    Status  New    Target Date  10/12/19      PT LONG TERM GOAL #2   Title  Patient will improve TUG to under 12 seconds to indicate signficant improvement in fall risk.     Baseline  TUG: 16 seconds    Time  6    Period  Weeks    Status  New    Target Date  10/12/19      PT LONG TERM GOAL #3   Title  Patient will improve 3minWT to over 360ft to indicate functional improvement in walking tolerance and greater ability to walk in stores before requiring a  sitting rest break    Baseline  150ft stopped test at 55sec secondary to fatigue    Time  6    Period  Weeks    Status  New    Target Date  10/12/19      PT LONG TERM GOAL #4   Title  Patient will demonstrate ability to stand for over 2 min to allow for improved ability to perform household chores at home.     Baseline  able to stand for 21 sec    Time  6    Period  Weeks    Status  New    Target Date  10/12/19      PT LONG TERM GOAL #5   Title  Pt will decrease 5TSTS to <12 seconds from a chair at 19in in height in order to demonstrate clinically significant improvement in LE strength    Baseline  5xSTS: 16 sec from 21" height chair with UE support    Time  6    Period  Weeks    Status  New    Target Date  10/12/19            Plan - 09/30/19 0957    Clinical Impression Statement  Patient demonstrates increased difficulty with performance of standing exercises today, performed seated exercises today secondary to this. Patient with increased LE fasiculation when standing stationary, she was not comfortable with performing stadning exercise today. Patient continues to demonstrate DOE with exercise and will benefit from furhter skilled therapy to return to prior level of function    Personal Factors and Comorbidities  Comorbidity 3+;Fitness    Comorbidities  Chronic LBP, spinal stenosis, anal prolapse, HTN    Examination-Activity Limitations  Bathing;Lift;Carry;Stand;Stairs;Squat;Sit;Transfers    Examination-Participation Restrictions  Cleaning;Community Activity    Stability/Clinical Decision Making  Evolving/Moderate complexity    Rehab Potential  Fair    PT Frequency  2x / week    PT Duration  6 weeks    PT Treatment/Interventions  Electrical Stimulation;Cryotherapy;Moist Heat;Ultrasound;Gait training;Stair training;Functional mobility training;Neuromuscular re-education;Balance training;Therapeutic exercise;Therapeutic activities;Patient/family education;Manual  techniques;Passive range of motion;Energy conservation;Wheelchair mobility training    PT Next Visit Plan  progress LE strengthening    PT Home Exercise Plan  See education section    Consulted and Agree with Plan of Care  Patient;Family member/caregiver  Family Member Consulted  Daughter       Patient will benefit from skilled therapeutic intervention in order to improve the following deficits and impairments:  Abnormal gait, Pain, Improper body mechanics, Decreased mobility, Decreased coordination, Increased muscle spasms, Postural dysfunction, Hypermobility, Decreased activity tolerance, Decreased endurance, Decreased range of motion, Decreased strength, Hypomobility, Decreased balance, Decreased safety awareness, Difficulty walking  Visit Diagnosis: Difficulty in walking, not elsewhere classified  Muscle weakness (generalized)  Unsteadiness on feet     Problem List Patient Active Problem List   Diagnosis Date Noted  . Anemia 08/29/2019  . Iron deficiency anemia 08/26/2019  . Chronic anticoagulation 10/25/2018  . CKD stage G3b/A1, GFR 30-44 and albumin creatinine ratio <30 mg/g 10/05/2018  . MGUS (monoclonal gammopathy of unknown significance) 07/23/2018  . Osteopenia after menopause 07/16/2018  . Diabetes mellitus with no complication ([place]) 123456  . Vitamin D deficiency 04/07/2018  . Heart palpitations 01/06/2018  . Tachycardia 01/06/2018  . Cystocele with rectocele 10/30/2017  . Rectocele 10/30/2017  . Microcytic red blood cells 09/23/2017  . Localized edema 02/13/2017  . Hemorrhoids 05/17/2016  . Obesity (BMI 30-39.9) 04/26/2016  . Multiple lung nodules 04/19/2016  . Chest pain 04/17/2016  . Partial symptomatic epilepsy with complex partial seizures, not intractable, without status epilepticus ([place]) 06/30/2015  . Bronchiectasis without complication ([facility]) [id number]  . Imbalance 11/08/2014  . Essential (primary) hypertension 11/08/2014  . Personal history of  malignant neoplasm of breast 11/08/2014  . History of malignant carcinoid tumor of bronchus and lung 11/08/2014  . H/O peptic ulcer 11/08/2014  . Asthma, mild intermittent 11/08/2014  . Pulmonary embolism ([place]) 11/08/2014  . Gonalgia 11/08/2014  . Leg varices 11/08/2014    Blythe Stanford, PT DPT 09/30/2019, 10:12 AM  [facility] [address], [place], [address] Phone: [phone]   Fax:  [phone]  Name: Rebecca Wolf MRN: [id number] Date of Birth: [birth year]

## 2019-10-01 ENCOUNTER — Inpatient Hospital Stay: Payer: Medicare Other | Attending: Hematology and Oncology

## 2019-10-01 ENCOUNTER — Ambulatory Visit: Payer: Medicare Other | Admitting: Hematology and Oncology

## 2019-10-01 ENCOUNTER — Ambulatory Visit: Payer: Medicare Other

## 2019-10-01 VITALS — BP 116/75 | HR 62 | Resp 18

## 2019-10-01 DIAGNOSIS — Z86711 Personal history of pulmonary embolism: Secondary | ICD-10-CM | POA: Insufficient documentation

## 2019-10-01 DIAGNOSIS — E538 Deficiency of other specified B group vitamins: Secondary | ICD-10-CM | POA: Diagnosis not present

## 2019-10-01 DIAGNOSIS — Z853 Personal history of malignant neoplasm of breast: Secondary | ICD-10-CM | POA: Insufficient documentation

## 2019-10-01 DIAGNOSIS — Z7901 Long term (current) use of anticoagulants: Secondary | ICD-10-CM | POA: Insufficient documentation

## 2019-10-01 DIAGNOSIS — D509 Iron deficiency anemia, unspecified: Secondary | ICD-10-CM | POA: Diagnosis not present

## 2019-10-01 DIAGNOSIS — Z79899 Other long term (current) drug therapy: Secondary | ICD-10-CM | POA: Diagnosis not present

## 2019-10-01 DIAGNOSIS — D472 Monoclonal gammopathy: Secondary | ICD-10-CM | POA: Diagnosis not present

## 2019-10-01 DIAGNOSIS — E340 Carcinoid syndrome: Secondary | ICD-10-CM | POA: Insufficient documentation

## 2019-10-01 DIAGNOSIS — Z86718 Personal history of other venous thrombosis and embolism: Secondary | ICD-10-CM | POA: Diagnosis not present

## 2019-10-01 MED ORDER — SODIUM CHLORIDE 0.9 % IV SOLN
Freq: Once | INTRAVENOUS | Status: AC
  Filled 2019-10-01: qty 250

## 2019-10-01 MED ORDER — IRON SUCROSE 20 MG/ML IV SOLN
200.0000 mg | Freq: Once | INTRAVENOUS | Status: AC
  Administered 2019-10-01: 200 mg via INTRAVENOUS

## 2019-10-01 NOTE — Patient Instructions (Signed)

## 2019-10-04 ENCOUNTER — Inpatient Hospital Stay: Payer: Medicare Other

## 2019-10-07 ENCOUNTER — Ambulatory Visit: Payer: Medicare Other

## 2019-10-08 ENCOUNTER — Other Ambulatory Visit: Payer: Self-pay

## 2019-10-08 ENCOUNTER — Inpatient Hospital Stay: Payer: Medicare Other

## 2019-10-08 VITALS — BP 97/66 | HR 71 | Temp 96.0°F | Resp 18

## 2019-10-08 DIAGNOSIS — E119 Type 2 diabetes mellitus without complications: Secondary | ICD-10-CM

## 2019-10-08 DIAGNOSIS — D509 Iron deficiency anemia, unspecified: Secondary | ICD-10-CM

## 2019-10-08 DIAGNOSIS — Z853 Personal history of malignant neoplasm of breast: Secondary | ICD-10-CM | POA: Diagnosis not present

## 2019-10-08 MED ORDER — IRON SUCROSE 20 MG/ML IV SOLN
200.0000 mg | Freq: Once | INTRAVENOUS | Status: AC
  Administered 2019-10-08: 200 mg via INTRAVENOUS

## 2019-10-08 MED ORDER — SODIUM CHLORIDE 0.9 % IV SOLN
Freq: Once | INTRAVENOUS | Status: AC
  Filled 2019-10-08: qty 250

## 2019-10-08 NOTE — Patient Instructions (Signed)

## 2019-10-11 ENCOUNTER — Inpatient Hospital Stay: Payer: Medicare Other

## 2019-10-14 ENCOUNTER — Other Ambulatory Visit: Payer: Self-pay

## 2019-10-14 ENCOUNTER — Ambulatory Visit: Payer: Medicare Other

## 2019-10-14 DIAGNOSIS — R262 Difficulty in walking, not elsewhere classified: Secondary | ICD-10-CM

## 2019-10-14 DIAGNOSIS — M6281 Muscle weakness (generalized): Secondary | ICD-10-CM

## 2019-10-14 NOTE — Therapy (Signed)
[facility] [address], [place], [address] Phone: [phone]   Fax:  [phone]  Physical Therapy Treatment  Patient Details  Name: Rebecca Wolf MRN: [id number] Date of Birth: 01/31/1925 Referring Provider (PT): Erline Levine MD   Encounter Date: 10/14/2019  PT End of Session - 10/14/19 0913    Visit Number  5    Number of Visits  13    Date for PT Re-Evaluation  10/12/19    Authorization Type  5/ 10 Medicare    PT Start Time  0905    PT Stop Time  0945    PT Time Calculation (min)  40 min    Equipment Utilized During Treatment  Gait belt    Activity Tolerance  Patient tolerated treatment well;Patient limited by fatigue    Behavior During Therapy  [facility] for tasks assessed/performed       Past Medical History:  Diagnosis Date  . Asthma   . Breast cancer ([place]) 2003   LT LUMPECTOMY  . Breast cancer ([place]) 2004   LT LUMPECTOMY  . 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  . GERD (gastroesophageal reflux disease)   . Hemorrhoids   . Hypertension   . Personal history of chemotherapy 2004   BREAST CA  . Personal history of radiation therapy 2003   BREAST CA  . Personal history of radiation therapy 2004   BREAST CA  . Polyp of colon 2002   bleeding polyp of right ascending colon  . PUD (peptic ulcer disease)   . Seizures ([place])    epilepsy well controlled    Past Surgical History:  Procedure Laterality Date  . BREAST EXCISIONAL BIOPSY Left 2003   positive  . BREAST EXCISIONAL BIOPSY Left 2004   positive  . BREAST LUMPECTOMY Left 2003   BREAST CA  . BREAST LUMPECTOMY Left 2004   BREAST CA  . HEMICOLECTOMY Right    bowel obstruction  . HEMORRHOID SURGERY    . THORACOTOMY/LOBECTOMY  1999   carcinoid tumor  . TOTAL VAGINAL HYSTERECTOMY    . VARICOSE VEIN SURGERY      There were no vitals filed for this visit.  Subjective Assessment - 10/14/19 0910    Subjective  Patient  states she continues to have increased difficulties with standing and walking for prlonged periods of time. Patient state no major changes otherwise.    Pertinent History  Anal Prolapse, Spinal stenosis, HTN,    Limitations  Lifting;Sitting;Standing;Walking;House hold activities    How long can you stand comfortably?  <74min    How long can you walk comfortably?  2 min with Rollator    Patient Stated Goals  To move easier    Currently in Pain?  No/denies    Pain Onset  More than a month ago          TREATMENT Therapeutic Exercise Nustep seat level 9 with cueing to perform stepping at a comfortable rate, analyzing symptoms throughout and taking SpO2: 97% - ~33 spm, 8 min level 2   Standing for time on airex - with B UE support 30 x 2  Hip abduction in standing - x 10  Hip extension in standing - x 10  Sit to stand - 2 x 5 Hip abduction in standing with UE support - x 10 B Hip extension in standing with UE support - x 10 B   Gait training Ambulation with rollator - 166ft x 2 with  CGA during ambulation, Patient walks with rollator far in front of her cued to improve throughout the session   Performed exercises to increase LE strength in LEs    PT Education - 10/14/19 0912    Education Details  form/technique with exercise; POC; educated to stay closer to rollator with ambulating    Person(s) Educated  Patient    Methods  Explanation;Demonstration    Comprehension  Verbalized understanding;Returned demonstration          PT Long Term Goals - 10/14/19 0924      PT LONG TERM GOAL #1   Title  Patient will be independent with HEP to continue benefits of therapy after discharge.    Baseline  Dependent with exercise progression; 10/14/2019: not currently performing exercises    Time  6    Period  Weeks    Status  On-going      PT LONG TERM GOAL #2   Title  Patient will improve TUG to under 12 seconds to indicate signficant improvement in fall risk.     Baseline  TUG: 16  seconds; 10/14/2019: 26 sec    Time  6    Period  Weeks    Status  On-going      PT LONG TERM GOAL #3   Title  Patient will improve 43minWT to over 385ft to indicate functional improvement in walking tolerance and greater ability to walk in stores before requiring a sitting rest break    Baseline  124ft stopped test at 55sec secondary to fatigue; 10/14/2019: 113ft in 80 sec    Time  6    Period  Weeks    Status  On-going      PT LONG TERM GOAL #4   Title  Patient will demonstrate ability to stand for over 2 min to allow for improved ability to perform household chores at home.     Baseline  able to stand for 21 sec; 10/14/2019: 30sec    Time  6    Period  Weeks    Status  On-going      PT LONG TERM GOAL #5   Title  Pt will decrease 5TSTS to <12 seconds from a chair at 19in in height in order to demonstrate clinically significant improvement in LE strength    Baseline  5xSTS: 16 sec from 21" height chair with UE support; 10/14/2019: 14.5 sec 22"    Time  6    Period  Weeks    Status  On-going            Plan - 10/14/19 1253    Clinical Impression Statement  Patient is making progress towards long term goals with improvement in total standing time and overall walking distance compared to previous session. Patient is improving although slowly. She demonstrates difficulty with standing and often feels a need to sit down before onset of fatigue. Patient will benefit from further skilled therapy to return to prior level of function.    Personal Factors and Comorbidities  Comorbidity 3+;Fitness    Comorbidities  Chronic LBP, spinal stenosis, anal prolapse, HTN    Examination-Activity Limitations  Bathing;Lift;Carry;Stand;Stairs;Squat;Sit;Transfers    Examination-Participation Restrictions  Cleaning;Community Activity    Stability/Clinical Decision Making  Evolving/Moderate complexity    Rehab Potential  Fair    PT Frequency  2x / week    PT Duration  6 weeks    PT  Treatment/Interventions  Electrical Stimulation;Cryotherapy;Moist Heat;Ultrasound;Gait training;Stair training;Functional mobility training;Neuromuscular re-education;Balance training;Therapeutic exercise;Therapeutic activities;Patient/family education;Manual techniques;Passive range  of motion;Energy conservation;Wheelchair mobility training    PT Next Visit Plan  progress LE strengthening    PT Home Exercise Plan  See education section    Consulted and Agree with Plan of Care  Patient;Family member/caregiver    Family Member Consulted  Daughter       Patient will benefit from skilled therapeutic intervention in order to improve the following deficits and impairments:  Abnormal gait, Pain, Improper body mechanics, Decreased mobility, Decreased coordination, Increased muscle spasms, Postural dysfunction, Hypermobility, Decreased activity tolerance, Decreased endurance, Decreased range of motion, Decreased strength, Hypomobility, Decreased balance, Decreased safety awareness, Difficulty walking  Visit Diagnosis: Difficulty in walking, not elsewhere classified  Muscle weakness (generalized)     Problem List Patient Active Problem List   Diagnosis Date Noted  . Anemia 08/29/2019  . Iron deficiency anemia 08/26/2019  . Chronic anticoagulation 10/25/2018  . CKD stage G3b/A1, GFR 30-44 and albumin creatinine ratio <30 mg/g 10/05/2018  . MGUS (monoclonal gammopathy of unknown significance) 07/23/2018  . Osteopenia after menopause 07/16/2018  . Diabetes mellitus with no complication ([place]) 123456  . Vitamin D deficiency 04/07/2018  . Heart palpitations 01/06/2018  . Tachycardia 01/06/2018  . Cystocele with rectocele 10/30/2017  . Rectocele 10/30/2017  . Microcytic red blood cells 09/23/2017  . Localized edema 02/13/2017  . Hemorrhoids 05/17/2016  . Obesity (BMI 30-39.9) 04/26/2016  . Multiple lung nodules 04/19/2016  . Chest pain 04/17/2016  . Partial symptomatic epilepsy with  complex partial seizures, not intractable, without status epilepticus ([place]) 06/30/2015  . Bronchiectasis without complication ([place]) [id number]  . Imbalance 11/08/2014  . Essential (primary) hypertension 11/08/2014  . Personal history of malignant neoplasm of breast 11/08/2014  . History of malignant carcinoid tumor of bronchus and lung 11/08/2014  . H/O peptic ulcer 11/08/2014  . Asthma, mild intermittent 11/08/2014  . Pulmonary embolism ([place]) 11/08/2014  . Gonalgia 11/08/2014  . Leg varices 11/08/2014    Blythe Stanford, PT DPT 10/14/2019, 12:57 PM  [facility] [address] [place], [address] Phone: [phone]   Fax:  [phone]  Name: Rebecca Wolf MRN: [id number] Date of Birth: 1925-05-02

## 2019-10-15 ENCOUNTER — Inpatient Hospital Stay: Payer: Medicare Other

## 2019-10-15 ENCOUNTER — Encounter: Payer: Self-pay | Admitting: Hematology and Oncology

## 2019-10-15 ENCOUNTER — Other Ambulatory Visit: Payer: Self-pay

## 2019-10-15 DIAGNOSIS — D509 Iron deficiency anemia, unspecified: Secondary | ICD-10-CM

## 2019-10-15 DIAGNOSIS — Z853 Personal history of malignant neoplasm of breast: Secondary | ICD-10-CM | POA: Diagnosis not present

## 2019-10-15 DIAGNOSIS — E538 Deficiency of other specified B group vitamins: Secondary | ICD-10-CM

## 2019-10-15 LAB — FOLATE: Folate: 13.6 ng/mL (ref >5.9)

## 2019-10-15 LAB — VITAMIN B12: Vitamin B-12: 270 pg/mL (ref 180–914)

## 2019-10-15 LAB — FERRITIN: Ferritin: 257 ng/mL (ref 11–307)

## 2019-10-15 NOTE — Progress Notes (Signed)
Spoke with the patient daughter Rebecca Wolf). The patient Name, DOB and medication has been verified by the patient daughter by phone today.

## 2019-10-16 NOTE — Progress Notes (Signed)
[name]  [address], [address] [place], [place] [address] Phone: [phone]  Fax: [phone]   Clinic Day:  10/18/2019  Referring physician: Erline Levine, MD  Chief Complaint: Rebecca Wolf is a 86 y.o. female  with a history of breast cancer, carcinoid tumor, and DVT/PE who is seen for a 7 week assessment.   HPI: The patient was last seen in the medical oncology clinic on 09/02/2019 via telemedicine. At that time, she felt good. She denied any bleeding. Fecal occult blood was negative. She had not started oral iron, B12 or folate. Patient was encouraged to start B12 [id number] mcg and folic acid 1 mg per day.   Patient's daughter, Rebecca Wolf, called on 09/14/2019 regarding diarrhea which started after initiation of oral 123456 and folic acid.  Oral 123456 and folic acid were stopped on 03/16/221.  Patient agreed to B12 injections.   Patient received Venofer x 4 (09/13/2019 - 10/08/2019).  She received a B12 injection on 09/20/2019.   Labs followed: 09/13/2019: Hematocrit 31.3, hemoglobin 9.7, MCV 75.1, platelets 286,000, WBC 8,500. Ferritin was 20. Vitamin B12 was 294 with folate 6.8.   10/15/2019: Ferritin was 257. Vitamin B12 was 270 with folate 13.6.   During the interim, she has felt "pretty good'. She notes bilateral elbow soreness and bruising. Her hands feels "grainy". She notes leg edema. She has had no bloody or black stool. She is tolerating her IV iron and B12 injection. Patient denies any diarrhea. She notes that she can not hear out of her left ear. She notes 4-5 days ago she heard a popping sound. Her daughter notes that no one got an hemoglobin A1c on her today for labs. Patient remains on Eliquis. Patient's daughter noted she was on Lasix x 3 days last week secondary to leg swelling. She is not on Lasix right now. She does not want any follow up mammograms unless she has a concern. Patient has an upcoming appointment with her cardiologist.    Past  Medical History:  Diagnosis Date  . Asthma   . Breast cancer ([place]) 2003   LT LUMPECTOMY  . Breast cancer ([place]) 2004   LT LUMPECTOMY  . 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  . GERD (gastroesophageal reflux disease)   . Hemorrhoids   . Hypertension   . Personal history of chemotherapy 2004   BREAST CA  . Personal history of radiation therapy 2003   BREAST CA  . Personal history of radiation therapy 2004   BREAST CA  . Polyp of colon 2002   bleeding polyp of right ascending colon  . PUD (peptic ulcer disease)   . Seizures ([place])    epilepsy well controlled    Past Surgical History:  Procedure Laterality Date  . BREAST EXCISIONAL BIOPSY Left 2003   positive  . BREAST EXCISIONAL BIOPSY Left 2004   positive  . BREAST LUMPECTOMY Left 2003   BREAST CA  . BREAST LUMPECTOMY Left 2004   BREAST CA  . HEMICOLECTOMY Right    bowel obstruction  . HEMORRHOID SURGERY    . THORACOTOMY/LOBECTOMY  1999   carcinoid tumor  . TOTAL VAGINAL HYSTERECTOMY    . VARICOSE VEIN SURGERY      Family History  Problem Relation Age of Onset  . Alcoholism Father   . Diabetes Sister   . Breast cancer Neg Hx     Social History:  reports that she has quit smoking. She has never used smokeless tobacco. She  reports that she does not drink alcohol or use drugs. She has a daughter named Rebecca Wolf. The patient is accompanied by Rebecca Wolf via [place] today.  Allergies:  Allergies  Allergen Reactions  . Levofloxacin Shortness Of Breath  . Sulfa Antibiotics Rash    Current Medications: Current Outpatient Medications  Medication Sig Dispense Refill  . acetaminophen (TYLENOL) 500 MG tablet Take 500 mg by mouth every 6 (six) hours as needed.    Marland Kitchen albuterol (ACCUNEB) 1.25 MG/3ML nebulizer solution Inhale 1 ampule into the lungs 4 (four) times daily as needed.    Marland Kitchen amLODipine (NORVASC) 5 MG tablet TAKE 1 TABLET BY MOUTH EVERY DAY (Patient taking differently: Take 2.5 mg by mouth daily. ) 90  tablet 0  . azelastine (ASTELIN) 0.1 % nasal spray U 1 SPRAY IEN BID TO REPLACE PATANASE    . budesonide (PULMICORT) 0.5 MG/2ML nebulizer solution Inhale into the lungs.    . Cholecalciferol 25 MCG (1000 UT) tablet Take 1,000 Units by mouth daily.     Marland Kitchen diltiazem (CARDIZEM CD) 120 MG 24 hr capsule Take 120 mg by mouth daily.     Marland Kitchen ELIQUIS 2.5 MG TABS tablet TAKE 1 TABLET BY MOUTH TWICE DAILY 60 tablet 5  . fluticasone (FLONASE) 50 MCG/ACT nasal spray Place 2 sprays into the nose daily.    . Fluticasone-Salmeterol (WIXELA INHUB) 500-50 MCG/DOSE AEPB Inhale 1 puff into the lungs 2 (two) times daily.    Marland Kitchen levalbuterol (XOPENEX) 0.63 MG/3ML nebulizer solution VVN Q 4 H PRN FOR WHZ    . levETIRAcetam (KEPPRA) 750 MG tablet Take 1 tablet by mouth 2 (two) times daily.    . montelukast (SINGULAIR) 10 MG tablet Take 1 tablet by mouth daily.    . Olopatadine HCl (PATANASE) 0.6 % SOLN Place 1 puff into the nose 2 (two) times daily.    . pantoprazole (PROTONIX) 40 MG tablet TAKE 1 TABLET BY MOUTH TWICE DAILY 60 tablet 0  . prednisoLONE acetate (PRED FORTE) 1 % ophthalmic suspension INTILL 2 GTS INTO OU QID  0  . predniSONE (DELTASONE) 2.5 MG tablet Take 2.5 mg by mouth daily with breakfast.     . spironolactone-hydrochlorothiazide (ALDACTAZIDE) 25-25 MG tablet TAKE 1 TABLET BY MOUTH DAILY 90 tablet 0  . sucralfate (CARAFATE) 1 g tablet Take 1 tablet (1 g total) by mouth 4 (four) times daily. 352 tablet 2  . SYMBICORT 160-4.5 MCG/ACT inhaler INL 2 PFS PO BID    . azithromycin (ZITHROMAX) 250 MG tablet Take 250 mg by mouth daily.      No current facility-administered medications for this visit.     Review of Systems  Constitutional: Negative for chills, diaphoresis, fever, malaise/fatigue and weight loss (stable).       Feels "pretty good".  HENT: Positive for hearing loss (left ear s/p popping sound). Negative for congestion, ear discharge, ear pain, nosebleeds, sinus pain and sore throat.   Eyes:  Negative.  Negative for blurred vision.  Respiratory: Negative for cough, hemoptysis, sputum production and shortness of breath (on exertion; better).   Cardiovascular: Positive for leg swelling. Negative for chest pain and palpitations.  Gastrointestinal: Negative.  Negative for abdominal pain, blood in stool, constipation, diarrhea, heartburn, melena, nausea and vomiting.       Eating well. Good appetite. Regurgitation of phlegm.  Genitourinary: Negative.  Negative for dysuria, frequency, hematuria and urgency.  Musculoskeletal: Positive for joint pain (elbows; sore). Negative for back pain, myalgias and neck pain.  Skin: Negative.  Negative  for itching and rash.  Neurological: Positive for sensory change (numbness in hands and feet) and weakness (generalized). Negative for dizziness, tingling, speech change, focal weakness, seizures and headaches.       No seizures.  Endo/Heme/Allergies: Bruises/bleeds easily (elbows).  Psychiatric/Behavioral: Negative.  Negative for depression and memory loss. The patient is not nervous/anxious and does not have insomnia.   All other systems reviewed and are negative.   Performance status (ECOG):  2  Physical Exam Vitals and nursing note reviewed.  Constitutional:      General: She is not in acute distress.    Appearance: She is well-developed and well-nourished. She is not diaphoretic.     Interventions: Face mask in place.  HENT:     Head: Normocephalic and atraumatic.     Right Ear: Tympanic membrane is scarred.     Mouth/Throat:     Mouth: Oropharynx is clear and moist.     Pharynx: No oropharyngeal exudate.      Comments: Gray hair. Wax noted in left ear. Eyes:     General: No scleral icterus.    Extraocular Movements: EOM normal.     Conjunctiva/sclera: Conjunctivae normal.     Pupils: Pupils are equal, round, and reactive to light.  Cardiovascular:     Rate and Rhythm: Normal rate and regular rhythm.     Heart sounds: No murmur heard.     Pulmonary:     Effort: Pulmonary effort is normal. No respiratory distress.     Breath sounds: Normal breath sounds. No wheezing or rales.  Chest:     Chest wall: No tenderness.  Abdominal:     General: Bowel sounds are normal. There is no distension.     Palpations: Abdomen is soft. There is no mass.     Tenderness: There is no abdominal tenderness. There is no guarding or rebound.  Musculoskeletal:        General: Tenderness and edema (3+) present. Normal range of motion.     Cervical back: Normal range of motion and neck supple.  Lymphadenopathy:     Head:     Right side of head: No preauricular, posterior auricular or occipital adenopathy.     Left side of head: No preauricular, posterior auricular or occipital adenopathy.     Cervical: No cervical adenopathy.     Upper Body:  No axillary adenopathy present.    Right upper body: No supraclavicular adenopathy.     Left upper body: No supraclavicular adenopathy.     Lower Body: No right inguinal adenopathy. No left inguinal adenopathy.  Skin:    General: Skin is warm and dry.  Neurological:     Mental Status: She is alert and oriented to person, place, and time.  Psychiatric:        Mood and Affect: Mood and affect normal.        Behavior: Behavior normal.        Thought Content: Thought content normal.        Judgment: Judgment normal.    No visits with results within 3 Day(s) from this visit.  Latest known visit with results is:  Appointment on 10/15/2019  Component Date Value Ref Range Status  . Folate 10/15/2019 13.6  >5.9 ng/mL Final   Performed at [facility], [address] [place], [place] [address]  . Vitamin B-12 10/15/2019 270  180 - 914 pg/mL Final   Comment: (NOTE) This assay is not validated for testing neonatal or myeloproliferative syndrome specimens  for Vitamin B12 levels. Performed at [facility], [address] [place], [place] [address]   . Ferritin 10/15/2019 257  11 - 307  ng/mL Final   Performed at [facility], [address] [place], [place] [address]    Assessment:  Rebecca Wolf is a 86 y.o. female with a history of breast cancer, carcinoid tumor, and DVT/PE.  She was diagnosed with precancerous breast lesion (? DCIS) in 2003. She underwent lumpectomy. She developedinvasive lobular carcinomain 2007. She underwent segmental mastectomy and sentinel lymph node biopsy followed by radiation. No lymph nodes were involved. Tumor was ER/PR positive. She completed 5 years of Arimidexin 08/2011. Mammogramon 04/18/2018was negative. CA27.29was 20.7(normal) on 09/24/2016 and 12.9 on 08/25/2019.  She was diagnosed with pulmonary carcinoidin 2001 after presenting with an abnormality on chest CT. She was asymptomatic. She underwent resection. She has had no evidence of recurrent disease.  She has a history ofDVT x 2and pulmonary embolism x 2. She has been on anticoagulation for 8-10 years. Chest CT angiogramon 06/29/2017 revealed no evidence of significant pulmonary embolus.There was an unchanged appearance of right paratracheal nodule and multiple pulmonary nodules.She was initially on Coumadin, but switched toEliquisin 2016. She is unaware of any hypercoagulable work-up.   She has a history of GI bleedingin 2002. She is s/p partial right ascending hemicolectomy for a bleeding polyp. She has a history of reflux and peptic ulcer disease. Hematocrit is normal (37.8). MCV is microcytic. While in [place], a hemoglobin electrophoresis was performed (no result available). She was started on oral on 08/31/2012. She is currently not taking iron.  Patient received Venofer x 4 (09/13/2019 - 10/08/2019).   Ferritin has been followed: 43 on 09/24/2016, 80 on 09/23/2017, 12 on 08/25/2019, 20 on 09/13/2019, and 237 on 10/15/2019.  Work upon 08/25/2019 revealed a hematocrit 31.4, hemoglobin 9.9, MCV 75.3, platelets 281,000, WBC 9,300.  Ferritin was 12 with an iron saturation of 7% and a TIBC of 420. Creatinine 1.10. Calcium, 8.5. Albumin 3.4.  M- spike was 0 gm/dL.  Vitamin B12 was 157 and folate 5.1. TSH was 2.163. Retic was 1.6%.   She has B12 deficiency.  B12 was 157 on 08/25/2019 and 270 on 10/15/2019.  She was briefly oral B12 (discontinued secondary to diarrhea).  She began B12 injections on 09/20/2019.  She has folate deficiency.  Folate was 5.1 on 08/25/2019, 6.8 on 09/13/2019 and 13.6 on 10/15/2019.  She is on oral folate.  Symptomatically, she denies any bleeding, flushing or diarrhea.  She denies any breast concerns.  Exam is stable.  Plan: 1.   Review labs from 10/15/2019. 2.   Labs today: CBC with diff. 3.   Iron deficiency anemia             Hematocrit 31.3.  Hemoglobin 9.7.  MCV 75.1 on 09/13/2019    Ferritin 20.  Hematocrit 36.4.  Hemoglobin 11.5.  MCV 76.5 today.                         Ferritin 257 on 10/15/2019.  She received Rebecca Wolf (last 10/08/2019).  Guaiac cards negative x 3.             She denies any bleeding.             Patient seen by Dr. Vicente Males in 2019 and declined endoscopy secondary to age and risks.  Continue to monitor 4.   B12 and folate deficiency  Folate 5.1 and B12 157 on 0/24/2021.             Folate 13.6 and B12 271 on 10/15/2019.  Patient began B12 injections on 09/20/2019.  B12 today and monthly x6.  Check folate annually. 5.   History of invasive lobular cancer             Clinically, she is doing well             Exam reveals no evidence of recurrent disease.             Patient declines follow-up mammograms secondary to age. 6.   Carcinoid syndrome             Patient denies any symptoms.  Continue to monitor. 7.   Recurrent DVT and pulmonary embolism             She denies any bleeding or recurrent thrombosis.             Continue Eliquis. 8.   History of MGUS             M-spike was 0 on 08/25/2019.  Check SPEP every 6 months. 9.   RN to call patient with  today's labs. 10.   RTC in 3 months for labs (CBC with diff, ferritin, SPEP). 11.   RTC in 6 months for MD assessment, labs (CBC with diff, ferritin, iron studies- day before) and +/- Venofer.  I discussed the assessment and treatment plan with the patient.  The patient was provided an opportunity to ask questions and all were answered.  The patient agreed with the plan and demonstrated an understanding of the instructions.  The patient was advised to call back or seek an in person evaluation if the symptoms worsen or if the condition fails to improve as anticipated.   Nolon Stalls, MD, PhD  10/18/2019, 11:18 AM  I, Selena Batten, am acting as scribe for [employer]. Rebecca Gip, MD, PhD.  I, Kyrstin Campillo C. Rebecca Gip, MD, have reviewed the above documentation for accuracy and completeness, and I agree with the above.

## 2019-10-17 DIAGNOSIS — E538 Deficiency of other specified B group vitamins: Secondary | ICD-10-CM | POA: Insufficient documentation

## 2019-10-18 ENCOUNTER — Encounter: Payer: Self-pay | Admitting: Hematology and Oncology

## 2019-10-18 ENCOUNTER — Telehealth: Payer: Self-pay

## 2019-10-18 ENCOUNTER — Inpatient Hospital Stay: Payer: Medicare Other | Attending: Hematology and Oncology

## 2019-10-18 ENCOUNTER — Inpatient Hospital Stay (HOSPITAL_BASED_OUTPATIENT_CLINIC_OR_DEPARTMENT_OTHER): Payer: Medicare Other | Admitting: Hematology and Oncology

## 2019-10-18 ENCOUNTER — Inpatient Hospital Stay: Payer: Medicare Other

## 2019-10-18 ENCOUNTER — Other Ambulatory Visit: Payer: Self-pay

## 2019-10-18 VITALS — BP 137/74 | HR 93 | Temp 97.9°F | Resp 20 | Wt 170.9 lb

## 2019-10-18 DIAGNOSIS — E538 Deficiency of other specified B group vitamins: Secondary | ICD-10-CM | POA: Diagnosis not present

## 2019-10-18 DIAGNOSIS — Z7901 Long term (current) use of anticoagulants: Secondary | ICD-10-CM | POA: Diagnosis not present

## 2019-10-18 DIAGNOSIS — D509 Iron deficiency anemia, unspecified: Secondary | ICD-10-CM

## 2019-10-18 DIAGNOSIS — D472 Monoclonal gammopathy: Secondary | ICD-10-CM | POA: Diagnosis not present

## 2019-10-18 DIAGNOSIS — Z853 Personal history of malignant neoplasm of breast: Secondary | ICD-10-CM | POA: Diagnosis not present

## 2019-10-18 DIAGNOSIS — E119 Type 2 diabetes mellitus without complications: Secondary | ICD-10-CM | POA: Diagnosis not present

## 2019-10-18 LAB — CBC WITH DIFFERENTIAL/PLATELET
Abs Immature Granulocytes: 0.04 10*3/uL (ref 0.00–0.07)
Basophils Absolute: 0.1 10*3/uL (ref 0.0–0.1)
Basophils Relative: 1 %
Eosinophils Absolute: 0.2 10*3/uL (ref 0.0–0.5)
Eosinophils Relative: 3 %
HCT: 36.4 % (ref 36.0–46.0)
Hemoglobin: 11.5 g/dL — ABNORMAL LOW (ref 12.0–15.0)
Immature Granulocytes: 1 %
Lymphocytes Relative: 41 %
Lymphs Abs: 3.2 10*3/uL (ref 0.7–4.0)
MCH: 24.2 pg — ABNORMAL LOW (ref 26.0–34.0)
MCHC: 31.6 g/dL (ref 30.0–36.0)
MCV: 76.5 fL — ABNORMAL LOW (ref 80.0–100.0)
Monocytes Absolute: 0.6 10*3/uL (ref 0.1–1.0)
Monocytes Relative: 7 %
Neutro Abs: 3.8 10*3/uL (ref 1.7–7.7)
Neutrophils Relative %: 47 %
Platelets: 249 10*3/uL (ref 150–400)
RBC: 4.76 MIL/uL (ref 3.87–5.11)
RDW: 17.7 % — ABNORMAL HIGH (ref 11.5–15.5)
WBC: 8 10*3/uL (ref 4.0–10.5)
nRBC: 0 % (ref 0.0–0.2)

## 2019-10-18 LAB — HEMOGLOBIN A1C
Hgb A1c MFr Bld: 7.2 % — ABNORMAL HIGH (ref 4.8–5.6)
Mean Plasma Glucose: 159.94 mg/dL

## 2019-10-18 MED ORDER — CYANOCOBALAMIN 1000 MCG/ML IJ SOLN
1000.0000 ug | Freq: Once | INTRAMUSCULAR | Status: AC
  Administered 2019-10-18: 12:00:00 1000 ug via INTRAMUSCULAR

## 2019-10-18 NOTE — Patient Instructions (Signed)

## 2019-10-18 NOTE — Telephone Encounter (Signed)
Informed patient's daughter, [name], CBC results were great, per Dr. Mike Gip. [name] verbalizes understanding and denies any further questions or concerns.

## 2019-10-18 NOTE — Progress Notes (Signed)
Patient here for follow up. Denies any concerns.  

## 2019-10-18 NOTE — Telephone Encounter (Signed)
-----   Message from Lequita Asal, MD sent at 10/18/2019 11:51 AM EDT ----- Regarding: Please call patient with results of CBC- looks great.  ----- Message ----- From: Interface, Lab In [facility] Sent: 10/18/2019  11:51 AM EDT To: Lequita Asal, MD

## 2019-10-19 ENCOUNTER — Telehealth: Payer: Self-pay

## 2019-10-19 NOTE — Telephone Encounter (Signed)
The patient A1C levels has been routed to her PCP office today.

## 2019-10-21 ENCOUNTER — Ambulatory Visit: Payer: Medicare Other

## 2019-10-21 ENCOUNTER — Other Ambulatory Visit: Payer: Self-pay

## 2019-10-21 DIAGNOSIS — R262 Difficulty in walking, not elsewhere classified: Secondary | ICD-10-CM

## 2019-10-21 DIAGNOSIS — R2681 Unsteadiness on feet: Secondary | ICD-10-CM

## 2019-10-21 DIAGNOSIS — M6281 Muscle weakness (generalized): Secondary | ICD-10-CM

## 2019-10-21 NOTE — Therapy (Signed)
[facility] [address] [place], [address] Phone: [phone]   Fax:  [phone]  Physical Therapy Treatment  Patient Details  Name: Rebecca Wolf MRN: [id number] Date of Birth: [birth year] Referring Provider (PT): Erline Levine MD   Encounter Date: 10/21/2019  PT End of Session - 10/21/19 0914    Visit Number  6    Number of Visits  13    Date for PT Re-Evaluation  10/12/19    Authorization Type  6/ 10 Medicare    PT Start Time  0900    PT Stop Time  0945    PT Time Calculation (min)  45 min    Equipment Utilized During Treatment  Gait belt    Activity Tolerance  Patient tolerated treatment well;Patient limited by fatigue    Behavior During Therapy  [facility] for tasks assessed/performed       Past Medical History:  Diagnosis Date  . Asthma   . Breast cancer ([place]) 2003   LT LUMPECTOMY  . Breast cancer ([place]) 2004   LT LUMPECTOMY  . 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  . GERD (gastroesophageal reflux disease)   . Hemorrhoids   . Hypertension   . Personal history of chemotherapy 2004   BREAST CA  . Personal history of radiation therapy 2003   BREAST CA  . Personal history of radiation therapy 2004   BREAST CA  . Polyp of colon 2002   bleeding polyp of right ascending colon  . PUD (peptic ulcer disease)   . Seizures ([place])    epilepsy well controlled    Past Surgical History:  Procedure Laterality Date  . BREAST EXCISIONAL BIOPSY Left 2003   positive  . BREAST EXCISIONAL BIOPSY Left 2004   positive  . BREAST LUMPECTOMY Left 2003   BREAST CA  . BREAST LUMPECTOMY Left 2004   BREAST CA  . HEMICOLECTOMY Right    bowel obstruction  . HEMORRHOID SURGERY    . THORACOTOMY/LOBECTOMY  1999   carcinoid tumor  . TOTAL VAGINAL HYSTERECTOMY    . VARICOSE VEIN SURGERY      There were no vitals filed for this visit.  Subjective Assessment - 10/21/19 0909    Subjective  Patient's  daughter reports she has been experiencing increased elbow pain which she reports feels may be from bursa inflammation. Patient reports she continues to experience neuropathy.    Pertinent History  Anal Prolapse, Spinal stenosis, HTN,    Limitations  Lifting;Sitting;Standing;Walking;House hold activities    How long can you stand comfortably?  <71min    How long can you walk comfortably?  2 min with Rollator    Patient Stated Goals  To move easier    Currently in Pain?  No/denies    Pain Onset  More than a month ago          TREATMENT Therapeutic Exercise   Sit to stand - 2 x 5 Hip abduction/ER in sitting - x 20 with GTB Seated Knee extension - x 10 with 5# Seated knee extension with SLR - x 10 with 5# and therapist assistance Hip abduction in standing with UE support - x 10 B Hip extension in standing with UE support - x 10 B Elbow flexion and extension in sitting - 2 x 10    Gait training Ambulation with rollator - 119ft x 2 with CGA during ambulation, Patient walks with rollator far in front of her cued  to improve throughout the session; shows improved after verbal cueing   Performed exercises to increase LE strength in LEs    PT Education - 10/21/19 0913    Education Details  form/technique with exercise;    Person(s) Educated  Patient    Methods  Explanation;Demonstration    Comprehension  Verbalized understanding;Returned demonstration          PT Long Term Goals - 10/14/19 0924      PT LONG TERM GOAL #1   Title  Patient will be independent with HEP to continue benefits of therapy after discharge.    Baseline  Dependent with exercise progression; 10/14/2019: not currently performing exercises    Time  6    Period  Weeks    Status  On-going      PT LONG TERM GOAL #2   Title  Patient will improve TUG to under 12 seconds to indicate signficant improvement in fall risk.     Baseline  TUG: 16 seconds; 10/14/2019: 26 sec    Time  6    Period  Weeks    Status   On-going      PT LONG TERM GOAL #3   Title  Patient will improve 74minWT to over 321ft to indicate functional improvement in walking tolerance and greater ability to walk in stores before requiring a sitting rest break    Baseline  152ft stopped test at 55sec secondary to fatigue; 10/14/2019: 169ft in 80 sec    Time  6    Period  Weeks    Status  On-going      PT LONG TERM GOAL #4   Title  Patient will demonstrate ability to stand for over 2 min to allow for improved ability to perform household chores at home.     Baseline  able to stand for 21 sec; 10/14/2019: 30sec    Time  6    Period  Weeks    Status  On-going      PT LONG TERM GOAL #5   Title  Pt will decrease 5TSTS to <12 seconds from a chair at 19in in height in order to demonstrate clinically significant improvement in LE strength    Baseline  5xSTS: 16 sec from 21" height chair with UE support; 10/14/2019: 14.5 sec 22"    Time  6    Period  Weeks    Status  On-going            Plan - 10/21/19 [id number]    Clinical Impression Statement  Continued to focus on improving standing stabilization with exercises performed today, patient does well with this however, requires frequent sitting breaks secondary to fatigue. Patient conitnues to demonstrate significant weakness along her hips and LE and will benefit from further skilled therapy focused on improving strength to return to prior level of function.    Personal Factors and Comorbidities  Comorbidity 3+;Fitness    Comorbidities  Chronic LBP, spinal stenosis, anal prolapse, HTN    Examination-Activity Limitations  Bathing;Lift;Carry;Stand;Stairs;Squat;Sit;Transfers    Examination-Participation Restrictions  Cleaning;Community Activity    Stability/Clinical Decision Making  Evolving/Moderate complexity    Rehab Potential  Fair    PT Frequency  2x / week    PT Duration  6 weeks    PT Treatment/Interventions  Electrical Stimulation;Cryotherapy;Moist Heat;Ultrasound;Gait  training;Stair training;Functional mobility training;Neuromuscular re-education;Balance training;Therapeutic exercise;Therapeutic activities;Patient/family education;Manual techniques;Passive range of motion;Energy conservation;Wheelchair mobility training    PT Next Visit Plan  progress LE strengthening    PT Home Exercise  Plan  See education section    Consulted and Agree with Plan of Care  Patient;Family member/caregiver    Family Member Consulted  Daughter       Patient will benefit from skilled therapeutic intervention in order to improve the following deficits and impairments:  Abnormal gait, Pain, Improper body mechanics, Decreased mobility, Decreased coordination, Increased muscle spasms, Postural dysfunction, Hypermobility, Decreased activity tolerance, Decreased endurance, Decreased range of motion, Decreased strength, Hypomobility, Decreased balance, Decreased safety awareness, Difficulty walking  Visit Diagnosis: Difficulty in walking, not elsewhere classified  Muscle weakness (generalized)  Unsteadiness on feet     Problem List Patient Active Problem List   Diagnosis Date Noted  . B12 deficiency 10/17/2019  . Folate deficiency 10/17/2019  . Anemia 08/29/2019  . Iron deficiency anemia 08/26/2019  . Chronic anticoagulation 10/25/2018  . CKD stage G3b/A1, GFR 30-44 and albumin creatinine ratio <30 mg/g 10/05/2018  . MGUS (monoclonal gammopathy of unknown significance) 07/23/2018  . Osteopenia after menopause 07/16/2018  . Diabetes mellitus with no complication ([place]) 123456  . Vitamin D deficiency 04/07/2018  . Heart palpitations 01/06/2018  . Tachycardia 01/06/2018  . Cystocele with rectocele 10/30/2017  . Rectocele 10/30/2017  . Microcytic red blood cells 09/23/2017  . Localized edema 02/13/2017  . Hemorrhoids 05/17/2016  . Obesity (BMI 30-39.9) 04/26/2016  . Multiple lung nodules 04/19/2016  . Chest pain 04/17/2016  . Partial symptomatic epilepsy with  complex partial seizures, not intractable, without status epilepticus ([place]) 06/30/2015  . Bronchiectasis without complication ([place]) [id number]  . Imbalance 11/08/2014  . Essential (primary) hypertension 11/08/2014  . Personal history of malignant neoplasm of breast 11/08/2014  . History of malignant carcinoid tumor of bronchus and lung 11/08/2014  . H/O peptic ulcer 11/08/2014  . Asthma, mild intermittent 11/08/2014  . Pulmonary embolism ([place]) 11/08/2014  . Gonalgia 11/08/2014  . Leg varices 11/08/2014    Blythe Stanford, PT DPT 10/21/2019, 9:42 AM  [facility] [address], [place], [address] Phone: [phone]   Fax:  [phone]  Name: TYREESE KNIEP MRN: [id number] Date of Birth: 1925-06-28

## 2019-10-28 ENCOUNTER — Ambulatory Visit: Payer: Medicare Other

## 2019-11-04 ENCOUNTER — Ambulatory Visit: Payer: Medicare Other | Attending: Internal Medicine

## 2019-11-04 DIAGNOSIS — M6281 Muscle weakness (generalized): Secondary | ICD-10-CM | POA: Insufficient documentation

## 2019-11-04 DIAGNOSIS — R262 Difficulty in walking, not elsewhere classified: Secondary | ICD-10-CM | POA: Insufficient documentation

## 2019-11-11 ENCOUNTER — Ambulatory Visit: Payer: Medicare Other

## 2019-11-11 ENCOUNTER — Other Ambulatory Visit: Payer: Self-pay

## 2019-11-11 DIAGNOSIS — R262 Difficulty in walking, not elsewhere classified: Secondary | ICD-10-CM | POA: Diagnosis present

## 2019-11-11 DIAGNOSIS — M6281 Muscle weakness (generalized): Secondary | ICD-10-CM | POA: Diagnosis present

## 2019-11-11 NOTE — Therapy (Signed)
[facility] [address] [place], [address] Phone: [phone]   Fax:  [phone]  Physical Therapy Treatment  Patient Details  Name: Rebecca Wolf MRN: [id number] Date of Birth: 1925/05/21 Referring Provider (PT): Erline Levine MD   Encounter Date: 11/11/2019  PT End of Session - 11/11/19 0835    Visit Number  7    Number of Visits  13    Date for PT Re-Evaluation  11/25/19    Authorization Type  7/ 10 Medicare    PT Start Time  0825    PT Stop Time  0903    PT Time Calculation (min)  38 min    Equipment Utilized During Treatment  Gait belt    Activity Tolerance  Patient tolerated treatment well;Patient limited by fatigue    Behavior During Therapy  [facility] for tasks assessed/performed       Past Medical History:  Diagnosis Date  . Asthma   . Breast cancer ([place]) 2003   LT LUMPECTOMY  . Breast cancer ([place]) 2004   LT LUMPECTOMY  . 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  . GERD (gastroesophageal reflux disease)   . Hemorrhoids   . Hypertension   . Personal history of chemotherapy 2004   BREAST CA  . Personal history of radiation therapy 2003   BREAST CA  . Personal history of radiation therapy 2004   BREAST CA  . Polyp of colon 2002   bleeding polyp of right ascending colon  . PUD (peptic ulcer disease)   . Seizures ([place])    epilepsy well controlled    Past Surgical History:  Procedure Laterality Date  . BREAST EXCISIONAL BIOPSY Left 2003   positive  . BREAST EXCISIONAL BIOPSY Left 2004   positive  . BREAST LUMPECTOMY Left 2003   BREAST CA  . BREAST LUMPECTOMY Left 2004   BREAST CA  . HEMICOLECTOMY Right    bowel obstruction  . HEMORRHOID SURGERY    . THORACOTOMY/LOBECTOMY  1999   carcinoid tumor  . TOTAL VAGINAL HYSTERECTOMY    . VARICOSE VEIN SURGERY      There were no vitals filed for this visit.  Subjective Assessment - 11/11/19 0830    Subjective  Patient  states she has been exercising intermittetly however is feeling tired today from walking into the building.    Pertinent History  Anal Prolapse, Spinal stenosis, HTN,    Limitations  Lifting;Sitting;Standing;Walking;House hold activities    How long can you stand comfortably?  <17min    How long can you walk comfortably?  2 min with Rollator    Patient Stated Goals  To move easier    Currently in Pain?  No/denies    Pain Onset  More than a month ago           TREATMENT Therapeutic Exercise   Sit to stand - 2 x 5 Heel raises in standing -x 15 Hip abduction in standing with UE support - x 10 B Squats in standing with UE support - x 10    Gait training Ambulation with rollator - 144ft x 2 with CGA during ambulation, Patient walks with rollator far in front of her cued to improve throughout the session; shows improved after verbal cueing   Performed exercises to increase LE strength in LEs        PT Education - 11/11/19 0833    Education Details  form/technique with exercise  Person(s) Educated  Patient    Methods  Explanation;Demonstration    Comprehension  Verbalized understanding;Returned demonstration          PT Long Term Goals - 10/14/19 0924      PT LONG TERM GOAL #1   Title  Patient will be independent with HEP to continue benefits of therapy after discharge.    Baseline  Dependent with exercise progression; 10/14/2019: not currently performing exercises    Time  6    Period  Weeks    Status  On-going      PT LONG TERM GOAL #2   Title  Patient will improve TUG to under 12 seconds to indicate signficant improvement in fall risk.     Baseline  TUG: 16 seconds; 10/14/2019: 26 sec    Time  6    Period  Weeks    Status  On-going      PT LONG TERM GOAL #3   Title  Patient will improve 35minWT to over 345ft to indicate functional improvement in walking tolerance and greater ability to walk in stores before requiring a sitting rest break    Baseline  183ft  stopped test at 55sec secondary to fatigue; 10/14/2019: 137ft in 80 sec    Time  6    Period  Weeks    Status  On-going      PT LONG TERM GOAL #4   Title  Patient will demonstrate ability to stand for over 2 min to allow for improved ability to perform household chores at home.     Baseline  able to stand for 21 sec; 10/14/2019: 30sec    Time  6    Period  Weeks    Status  On-going      PT LONG TERM GOAL #5   Title  Pt will decrease 5TSTS to <12 seconds from a chair at 19in in height in order to demonstrate clinically significant improvement in LE strength    Baseline  5xSTS: 16 sec from 21" height chair with UE support; 10/14/2019: 14.5 sec 22"    Time  6    Period  Weeks    Status  On-going            Plan - 11/11/19 [id number]    Clinical Impression Statement  Increased lethargy during today's session, performed exercises within patient's tolerance. Focused on dynamic standing exercises to encourage growth of muscular tissue in the LEs. Patient walking closer to [facility] compared to previous session indicating functional carryover between session however fatigue continues to limit progress. Patient will benefit from further skilled therapy to return to prior level of function.    Personal Factors and Comorbidities  Comorbidity 3+;Fitness    Comorbidities  Chronic LBP, spinal stenosis, anal prolapse, HTN    Examination-Activity Limitations  Bathing;Lift;Carry;Stand;Stairs;Squat;Sit;Transfers    Examination-Participation Restrictions  Cleaning;Community Activity    Stability/Clinical Decision Making  Evolving/Moderate complexity    Rehab Potential  Fair    PT Frequency  2x / week    PT Duration  6 weeks    PT Treatment/Interventions  Electrical Stimulation;Cryotherapy;Moist Heat;Ultrasound;Gait training;Stair training;Functional mobility training;Neuromuscular re-education;Balance training;Therapeutic exercise;Therapeutic activities;Patient/family education;Manual techniques;Passive range of  motion;Energy conservation;Wheelchair mobility training    PT Next Visit Plan  progress LE strengthening    PT Home Exercise Plan  See education section    Consulted and Agree with Plan of Care  Patient;Family member/caregiver    Family Member Consulted  Daughter       Patient will benefit  from skilled therapeutic intervention in order to improve the following deficits and impairments:  Abnormal gait, Pain, Improper body mechanics, Decreased mobility, Decreased coordination, Increased muscle spasms, Postural dysfunction, Hypermobility, Decreased activity tolerance, Decreased endurance, Decreased range of motion, Decreased strength, Hypomobility, Decreased balance, Decreased safety awareness, Difficulty walking  Visit Diagnosis: Difficulty in walking, not elsewhere classified  Muscle weakness (generalized)     Problem List Patient Active Problem List   Diagnosis Date Noted  . B12 deficiency 10/17/2019  . Folate deficiency 10/17/2019  . Anemia 08/29/2019  . Iron deficiency anemia 08/26/2019  . Chronic anticoagulation 10/25/2018  . CKD stage G3b/A1, GFR 30-44 and albumin creatinine ratio <30 mg/g 10/05/2018  . MGUS (monoclonal gammopathy of unknown significance) 07/23/2018  . Osteopenia after menopause 07/16/2018  . Diabetes mellitus with no complication ([place]) 123456  . Vitamin D deficiency 04/07/2018  . Heart palpitations 01/06/2018  . Tachycardia 01/06/2018  . Cystocele with rectocele 10/30/2017  . Rectocele 10/30/2017  . Microcytic red blood cells 09/23/2017  . Localized edema 02/13/2017  . Hemorrhoids 05/17/2016  . Obesity (BMI 30-39.9) 04/26/2016  . Multiple lung nodules 04/19/2016  . Chest pain 04/17/2016  . Partial symptomatic epilepsy with complex partial seizures, not intractable, without status epilepticus ([place]) 06/30/2015  . Bronchiectasis without complication ([place]) [id number]  . Imbalance 11/08/2014  . Essential (primary) hypertension 11/08/2014  . Personal  history of malignant neoplasm of breast 11/08/2014  . History of malignant carcinoid tumor of bronchus and lung 11/08/2014  . H/O peptic ulcer 11/08/2014  . Asthma, mild intermittent 11/08/2014  . Pulmonary embolism ([place]) 11/08/2014  . Gonalgia 11/08/2014  . Leg varices 11/08/2014    Blythe Stanford, PT DPT 11/11/2019, 10:14 AM  [facility] [address], [place], [address] Phone: [phone]   Fax:  [phone]  Name: KATALIN CRADDOCK MRN: [id number] Date of Birth: Feb 07, 1925

## 2019-11-15 ENCOUNTER — Inpatient Hospital Stay: Payer: Medicare Other

## 2019-11-15 ENCOUNTER — Inpatient Hospital Stay: Payer: Medicare Other | Attending: Hematology and Oncology

## 2019-11-15 DIAGNOSIS — E538 Deficiency of other specified B group vitamins: Secondary | ICD-10-CM | POA: Insufficient documentation

## 2019-11-18 ENCOUNTER — Other Ambulatory Visit: Payer: Self-pay

## 2019-11-18 ENCOUNTER — Ambulatory Visit: Payer: Medicare Other

## 2019-11-18 DIAGNOSIS — M6281 Muscle weakness (generalized): Secondary | ICD-10-CM

## 2019-11-18 DIAGNOSIS — R262 Difficulty in walking, not elsewhere classified: Secondary | ICD-10-CM

## 2019-11-18 NOTE — Therapy (Signed)
[facility] [address], [place], [address] Phone: [phone]   Fax:  [phone]  Physical Therapy Treatment  Patient Details  Name: Rebecca Wolf MRN: [id number] Date of Birth: 18-Mar-1925 Referring Provider (PT): Erline Levine MD   Encounter Date: 11/18/2019  PT End of Session - 11/18/19 0844    Visit Number  8    Number of Visits  13    Date for PT Re-Evaluation  11/25/19    Authorization Type  8/ 10 Medicare    PT Start Time  0815    PT Stop Time  0900    PT Time Calculation (min)  45 min    Equipment Utilized During Treatment  Gait belt    Activity Tolerance  Patient tolerated treatment well;Patient limited by fatigue    Behavior During Therapy  [facility] for tasks assessed/performed       Past Medical History:  Diagnosis Date  . Asthma   . Breast cancer ([place]) 2003   LT LUMPECTOMY  . Breast cancer ([place]) 2004   LT LUMPECTOMY  . 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  . GERD (gastroesophageal reflux disease)   . Hemorrhoids   . Hypertension   . Personal history of chemotherapy 2004   BREAST CA  . Personal history of radiation therapy 2003   BREAST CA  . Personal history of radiation therapy 2004   BREAST CA  . Polyp of colon 2002   bleeding polyp of right ascending colon  . PUD (peptic ulcer disease)   . Seizures ([place])    epilepsy well controlled    Past Surgical History:  Procedure Laterality Date  . BREAST EXCISIONAL BIOPSY Left 2003   positive  . BREAST EXCISIONAL BIOPSY Left 2004   positive  . BREAST LUMPECTOMY Left 2003   BREAST CA  . BREAST LUMPECTOMY Left 2004   BREAST CA  . HEMICOLECTOMY Right    bowel obstruction  . HEMORRHOID SURGERY    . THORACOTOMY/LOBECTOMY  1999   carcinoid tumor  . TOTAL VAGINAL HYSTERECTOMY    . VARICOSE VEIN SURGERY      There were no vitals filed for this visit.  Subjective Assessment - 11/18/19 0827    Subjective  Patient  reports she has been walking about her house for exercise but no other exercise otherwise.    Pertinent History  Anal Prolapse, Spinal stenosis, HTN,    Limitations  Lifting;Sitting;Standing;Walking;House hold activities    How long can you stand comfortably?  <72min    How long can you walk comfortably?  2 min with Rollator    Patient Stated Goals  To move easier    Currently in Pain?  No/denies    Pain Onset  More than a month ago             TREATMENT Therapeutic Exercise   Sit to stand - x5 Hip abduction in standing - x 10  Hip extension in standing - x 10 Heel raises in standing -x 15 Squats in standing with UE support - x 10    Gait training Ambulation with rollator - x263ft with CGA during ambulation, Patient walks with rollator far in front of her cued to improve throughout the session; shows improved after verbal cueing   Performed exercises to increase LE strength in LEs  PT Education - 11/18/19 0842    Education Details  form/technique with exercise    Person(s) Educated  Patient  Methods  Explanation;Demonstration    Comprehension  Verbalized understanding;Returned demonstration          PT Long Term Goals - 10/14/19 0924      PT LONG TERM GOAL #1   Title  Patient will be independent with HEP to continue benefits of therapy after discharge.    Baseline  Dependent with exercise progression; 10/14/2019: not currently performing exercises    Time  6    Period  Weeks    Status  On-going      PT LONG TERM GOAL #2   Title  Patient will improve TUG to under 12 seconds to indicate signficant improvement in fall risk.     Baseline  TUG: 16 seconds; 10/14/2019: 26 sec    Time  6    Period  Weeks    Status  On-going      PT LONG TERM GOAL #3   Title  Patient will improve 60minWT to over 362ft to indicate functional improvement in walking tolerance and greater ability to walk in stores before requiring a sitting rest break    Baseline  151ft stopped test at  55sec secondary to fatigue; 10/14/2019: 158ft in 80 sec    Time  6    Period  Weeks    Status  On-going      PT LONG TERM GOAL #4   Title  Patient will demonstrate ability to stand for over 2 min to allow for improved ability to perform household chores at home.     Baseline  able to stand for 21 sec; 10/14/2019: 30sec    Time  6    Period  Weeks    Status  On-going      PT LONG TERM GOAL #5   Title  Pt will decrease 5TSTS to <12 seconds from a chair at 19in in height in order to demonstrate clinically significant improvement in LE strength    Baseline  5xSTS: 16 sec from 21" height chair with UE support; 10/14/2019: 14.5 sec 22"    Time  6    Period  Weeks    Status  On-going            Plan - 11/18/19 [id number]    Clinical Impression Statement  Performed exercises to improve LE functioning and strength through dynamic movements. Patient demonstrates improvement in stamina today as evidenced by ability to perform greater amount of exercises before onset of pain. Although patiet is improving she conitnues to have limitations with standing tolerance and decreased walking ability as she is unable to perform stepping with heavy use of UEs. Patient will continue to benefit from further skilled therapy to return to prior level of function.    Personal Factors and Comorbidities  Comorbidity 3+;Fitness    Comorbidities  Chronic LBP, spinal stenosis, anal prolapse, HTN    Examination-Activity Limitations  Bathing;Lift;Carry;Stand;Stairs;Squat;Sit;Transfers    Examination-Participation Restrictions  Cleaning;Community Activity    Stability/Clinical Decision Making  Evolving/Moderate complexity    Rehab Potential  Fair    PT Frequency  2x / week    PT Duration  6 weeks    PT Treatment/Interventions  Electrical Stimulation;Cryotherapy;Moist Heat;Ultrasound;Gait training;Stair training;Functional mobility training;Neuromuscular re-education;Balance training;Therapeutic exercise;Therapeutic  activities;Patient/family education;Manual techniques;Passive range of motion;Energy conservation;Wheelchair mobility training    PT Next Visit Plan  progress LE strengthening    PT Home Exercise Plan  See education section    Consulted and Agree with Plan of Care  Patient;Family member/caregiver    Family Member Consulted  Daughter       Patient will benefit from skilled therapeutic intervention in order to improve the following deficits and impairments:  Abnormal gait, Pain, Improper body mechanics, Decreased mobility, Decreased coordination, Increased muscle spasms, Postural dysfunction, Hypermobility, Decreased activity tolerance, Decreased endurance, Decreased range of motion, Decreased strength, Hypomobility, Decreased balance, Decreased safety awareness, Difficulty walking  Visit Diagnosis: Difficulty in walking, not elsewhere classified  Muscle weakness (generalized)     Problem List Patient Active Problem List   Diagnosis Date Noted  . B12 deficiency 10/17/2019  . Folate deficiency 10/17/2019  . Anemia 08/29/2019  . Iron deficiency anemia 08/26/2019  . Chronic anticoagulation 10/25/2018  . CKD stage G3b/A1, GFR 30-44 and albumin creatinine ratio <30 mg/g 10/05/2018  . MGUS (monoclonal gammopathy of unknown significance) 07/23/2018  . Osteopenia after menopause 07/16/2018  . Diabetes mellitus with no complication ([place]) 123456  . Vitamin D deficiency 04/07/2018  . Heart palpitations 01/06/2018  . Tachycardia 01/06/2018  . Cystocele with rectocele 10/30/2017  . Rectocele 10/30/2017  . Microcytic red blood cells 09/23/2017  . Localized edema 02/13/2017  . Hemorrhoids 05/17/2016  . Obesity (BMI 30-39.9) 04/26/2016  . Multiple lung nodules 04/19/2016  . Chest pain 04/17/2016  . Partial symptomatic epilepsy with complex partial seizures, not intractable, without status epilepticus ([place]) 06/30/2015  . Bronchiectasis without complication ([place]) [id number]  . Imbalance  11/08/2014  . Essential (primary) hypertension 11/08/2014  . Personal history of malignant neoplasm of breast 11/08/2014  . History of malignant carcinoid tumor of bronchus and lung 11/08/2014  . H/O peptic ulcer 11/08/2014  . Asthma, mild intermittent 11/08/2014  . Pulmonary embolism ([place]) 11/08/2014  . Gonalgia 11/08/2014  . Leg varices 11/08/2014    Blythe Stanford, PT DPT 11/18/2019, 9:05 AM  [facility] [address] [place], [address] Phone: [phone]   Fax:  [phone]  Name: DANAPAOLA FURBEE MRN: [id number] Date of Birth: 01/08/1925

## 2019-11-19 ENCOUNTER — Inpatient Hospital Stay: Payer: Medicare Other

## 2019-11-19 DIAGNOSIS — E538 Deficiency of other specified B group vitamins: Secondary | ICD-10-CM | POA: Diagnosis present

## 2019-11-19 DIAGNOSIS — D509 Iron deficiency anemia, unspecified: Secondary | ICD-10-CM

## 2019-11-19 MED ORDER — CYANOCOBALAMIN 1000 MCG/ML IJ SOLN
1000.0000 ug | Freq: Once | INTRAMUSCULAR | Status: AC
  Administered 2019-11-19: 1000 ug via INTRAMUSCULAR
  Filled 2019-11-19: qty 1

## 2019-11-25 ENCOUNTER — Ambulatory Visit: Payer: Medicare Other

## 2019-11-25 ENCOUNTER — Other Ambulatory Visit: Payer: Self-pay

## 2019-11-25 DIAGNOSIS — M6281 Muscle weakness (generalized): Secondary | ICD-10-CM

## 2019-11-25 DIAGNOSIS — R262 Difficulty in walking, not elsewhere classified: Secondary | ICD-10-CM

## 2019-11-25 NOTE — Therapy (Signed)
[facility] [address], [place], [address] Phone: [phone]   Fax:  [phone]  Physical Therapy Treatment  Patient Details  Name: Rebecca Wolf MRN: [id number] Date of Birth: 10-Mar-1925 Referring Provider (PT): Loraine Maple, Santiago Glad MD   Encounter Date: 11/25/2019  PT End of Session - 11/25/19 1049    Visit Number  9    Number of Visits  13    Date for PT Re-Evaluation  11/25/19    Authorization Type  9/ 10 Medicare    PT Start Time  0945    PT Stop Time  1030    PT Time Calculation (min)  45 min    Equipment Utilized During Treatment  Gait belt    Activity Tolerance  Patient tolerated treatment well;Patient limited by fatigue    Behavior During Therapy  [facility] for tasks assessed/performed       Past Medical History:  Diagnosis Date  . Asthma   . Breast cancer ([place]) 2003   LT LUMPECTOMY  . Breast cancer ([place]) 2004   LT LUMPECTOMY  . 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  . GERD (gastroesophageal reflux disease)   . Hemorrhoids   . Hypertension   . Personal history of chemotherapy 2004   BREAST CA  . Personal history of radiation therapy 2003   BREAST CA  . Personal history of radiation therapy 2004   BREAST CA  . Polyp of colon 2002   bleeding polyp of right ascending colon  . PUD (peptic ulcer disease)   . Seizures ([place])    epilepsy well controlled    Past Surgical History:  Procedure Laterality Date  . BREAST EXCISIONAL BIOPSY Left 2003   positive  . BREAST EXCISIONAL BIOPSY Left 2004   positive  . BREAST LUMPECTOMY Left 2003   BREAST CA  . BREAST LUMPECTOMY Left 2004   BREAST CA  . HEMICOLECTOMY Right    bowel obstruction  . HEMORRHOID SURGERY    . THORACOTOMY/LOBECTOMY  1999   carcinoid tumor  . TOTAL VAGINAL HYSTERECTOMY    . VARICOSE VEIN SURGERY      There were no vitals filed for this visit.  Subjective Assessment - 11/25/19 1019    Subjective  Patient  states no major changes since the previous session. Patient states she has been walking around her home as necessary.    Pertinent History  Anal Prolapse, Spinal stenosis, HTN,    Limitations  Lifting;Sitting;Standing;Walking;House hold activities    How long can you stand comfortably?  <76min    How long can you walk comfortably?  2 min with Rollator    Patient Stated Goals  To move easier    Currently in Pain?  No/denies    Pain Onset  More than a month ago           TREATMENT  Therapeutic Exercise  Hip abduction in standing - x 10  Hip extension in standing - x 10  Heel raises in standing -x 15  Squats in standing with UE support - x 10  Gait training  Ambulation with rollator - x139ft, x171ft with CGA during ambulation, Patient walks with rollator far in front of her cued to improve throughout the session; shows improved after verbal cueing  Performed exercises to increase LE strength in LEs    PT Education - 11/25/19 1049    Education Details  form/technique with exercise    Person(s) Educated  Patient  Methods  Explanation;Demonstration    Comprehension  Returned demonstration;Verbalized understanding          PT Long Term Goals - 10/14/19 0924      PT LONG TERM GOAL #1   Title  Patient will be independent with HEP to continue benefits of therapy after discharge.    Baseline  Dependent with exercise progression; 10/14/2019: not currently performing exercises    Time  6    Period  Weeks    Status  On-going      PT LONG TERM GOAL #2   Title  Patient will improve TUG to under 12 seconds to indicate signficant improvement in fall risk.     Baseline  TUG: 16 seconds; 10/14/2019: 26 sec    Time  6    Period  Weeks    Status  On-going      PT LONG TERM GOAL #3   Title  Patient will improve 67minWT to over 332ft to indicate functional improvement in walking tolerance and greater ability to walk in stores before requiring a sitting rest break    Baseline  136ft  stopped test at 55sec secondary to fatigue; 10/14/2019: 13ft in 80 sec    Time  6    Period  Weeks    Status  On-going      PT LONG TERM GOAL #4   Title  Patient will demonstrate ability to stand for over 2 min to allow for improved ability to perform household chores at home.     Baseline  able to stand for 21 sec; 10/14/2019: 30sec    Time  6    Period  Weeks    Status  On-going      PT LONG TERM GOAL #5   Title  Pt will decrease 5TSTS to <12 seconds from a chair at 19in in height in order to demonstrate clinically significant improvement in LE strength    Baseline  5xSTS: 16 sec from 21" height chair with UE support; 10/14/2019: 14.5 sec 22"    Time  6    Period  Weeks    Status  On-going            Plan - 11/25/19 1058    Clinical Impression Statement  Continues to have increased fatigue with standing and walking today and requires greater amount of sitting rest breaks compared the previous session. Will continue to address improving functional strength and balance throughout therapy as she continues to have increased balanace difficulties. Patient will benefit from further skilled therapy to return to prior level of function.    Personal Factors and Comorbidities  Comorbidity 3+;Fitness    Comorbidities  Chronic LBP, spinal stenosis, anal prolapse, HTN    Examination-Activity Limitations  Bathing;Lift;Carry;Stand;Stairs;Squat;Sit;Transfers    Examination-Participation Restrictions  Cleaning;Community Activity    Stability/Clinical Decision Making  Evolving/Moderate complexity    Rehab Potential  Fair    PT Frequency  2x / week    PT Duration  6 weeks    PT Treatment/Interventions  Electrical Stimulation;Cryotherapy;Moist Heat;Ultrasound;Gait training;Stair training;Functional mobility training;Neuromuscular re-education;Balance training;Therapeutic exercise;Therapeutic activities;Patient/family education;Manual techniques;Passive range of motion;Energy conservation;Wheelchair  mobility training    PT Next Visit Plan  progress LE strengthening    PT Home Exercise Plan  See education section    Consulted and Agree with Plan of Care  Patient;Family member/caregiver    Family Member Consulted  Daughter       Patient will benefit from skilled therapeutic intervention in order to improve the following  deficits and impairments:  Abnormal gait, Pain, Improper body mechanics, Decreased mobility, Decreased coordination, Increased muscle spasms, Postural dysfunction, Hypermobility, Decreased activity tolerance, Decreased endurance, Decreased range of motion, Decreased strength, Hypomobility, Decreased balance, Decreased safety awareness, Difficulty walking  Visit Diagnosis: Difficulty in walking, not elsewhere classified  Muscle weakness (generalized)     Problem List Patient Active Problem List   Diagnosis Date Noted  . B12 deficiency 10/17/2019  . Folate deficiency 10/17/2019  . Anemia 08/29/2019  . Iron deficiency anemia 08/26/2019  . Chronic anticoagulation 10/25/2018  . CKD stage G3b/A1, GFR 30-44 and albumin creatinine ratio <30 mg/g 10/05/2018  . MGUS (monoclonal gammopathy of unknown significance) 07/23/2018  . Osteopenia after menopause 07/16/2018  . Diabetes mellitus with no complication ([place]) 123456  . Vitamin D deficiency 04/07/2018  . Heart palpitations 01/06/2018  . Tachycardia 01/06/2018  . Cystocele with rectocele 10/30/2017  . Rectocele 10/30/2017  . Microcytic red blood cells 09/23/2017  . Localized edema 02/13/2017  . Hemorrhoids 05/17/2016  . Obesity (BMI 30-39.9) 04/26/2016  . Multiple lung nodules 04/19/2016  . Chest pain 04/17/2016  . Partial symptomatic epilepsy with complex partial seizures, not intractable, without status epilepticus ([place]) 06/30/2015  . Bronchiectasis without complication ([place]) [id number]  . Imbalance 11/08/2014  . Essential (primary) hypertension 11/08/2014  . Personal history of malignant neoplasm of breast  11/08/2014  . History of malignant carcinoid tumor of bronchus and lung 11/08/2014  . H/O peptic ulcer 11/08/2014  . Asthma, mild intermittent 11/08/2014  . Pulmonary embolism ([place]) 11/08/2014  . Gonalgia 11/08/2014  . Leg varices 11/08/2014    Blythe Stanford, PT DPT 11/25/2019, 11:20 AM  [facility] [address], [place], [address] Phone: [phone]   Fax:  [phone]  Name: EBONNIE STAHELI MRN: [id number] Date of Birth: [birth year]

## 2019-12-07 ENCOUNTER — Ambulatory Visit: Payer: Medicare Other

## 2019-12-08 ENCOUNTER — Ambulatory Visit: Payer: Medicare Other

## 2019-12-13 ENCOUNTER — Inpatient Hospital Stay: Payer: Medicare Other | Attending: Hematology and Oncology

## 2019-12-13 ENCOUNTER — Other Ambulatory Visit: Payer: Self-pay

## 2019-12-13 ENCOUNTER — Inpatient Hospital Stay: Payer: Medicare Other

## 2019-12-13 DIAGNOSIS — D509 Iron deficiency anemia, unspecified: Secondary | ICD-10-CM

## 2019-12-13 DIAGNOSIS — E538 Deficiency of other specified B group vitamins: Secondary | ICD-10-CM | POA: Insufficient documentation

## 2019-12-13 MED ORDER — CYANOCOBALAMIN 1000 MCG/ML IJ SOLN
1000.0000 ug | Freq: Once | INTRAMUSCULAR | Status: AC
  Administered 2019-12-13: 1000 ug via INTRAMUSCULAR
  Filled 2019-12-13: qty 1

## 2019-12-16 ENCOUNTER — Other Ambulatory Visit: Payer: Self-pay

## 2019-12-16 ENCOUNTER — Ambulatory Visit: Payer: Medicare Other

## 2019-12-16 DIAGNOSIS — R278 Other lack of coordination: Secondary | ICD-10-CM | POA: Insufficient documentation

## 2019-12-16 DIAGNOSIS — Z9181 History of falling: Secondary | ICD-10-CM | POA: Insufficient documentation

## 2019-12-16 DIAGNOSIS — R2681 Unsteadiness on feet: Secondary | ICD-10-CM | POA: Insufficient documentation

## 2019-12-16 DIAGNOSIS — M6281 Muscle weakness (generalized): Secondary | ICD-10-CM | POA: Insufficient documentation

## 2019-12-16 DIAGNOSIS — R262 Difficulty in walking, not elsewhere classified: Secondary | ICD-10-CM | POA: Insufficient documentation

## 2019-12-16 NOTE — Therapy (Unsigned)
[facility] [address] [place], [address] Phone: [phone]   Fax:  [phone]  Physical Therapy Treatment  Patient Details  Name: Rebecca Wolf MRN: [id number] Date of Birth: [birth year] Referring Provider (PT): Loraine Maple, Santiago Glad MD   Encounter Date: 12/16/2019  Pt arrives for session with DTR. Author asks for DTR to attend session for help with HPI as patient is out of cert period and this visit is reassessment. Pt has visual, hearing, and memory impairment, author hoping to get more subjective detail/correlation from DTR. DTR reports she is unable to attend session due to schedule conflicts/prior engagement, asks to cancel today's session and reschedule next week. Author agreeable.     Past Medical History:  Diagnosis Date  . Asthma   . Breast cancer ([place]) 2003   LT LUMPECTOMY  . Breast cancer ([place]) 2004   LT LUMPECTOMY  . 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  . GERD (gastroesophageal reflux disease)   . Hemorrhoids   . Hypertension   . Personal history of chemotherapy 2004   BREAST CA  . Personal history of radiation therapy 2003   BREAST CA  . Personal history of radiation therapy 2004   BREAST CA  . Polyp of colon 2002   bleeding polyp of right ascending colon  . PUD (peptic ulcer disease)   . Seizures ([place])    epilepsy well controlled    Past Surgical History:  Procedure Laterality Date  . BREAST EXCISIONAL BIOPSY Left 2003   positive  . BREAST EXCISIONAL BIOPSY Left 2004   positive  . BREAST LUMPECTOMY Left 2003   BREAST CA  . BREAST LUMPECTOMY Left 2004   BREAST CA  . HEMICOLECTOMY Right    bowel obstruction  . HEMORRHOID SURGERY    . THORACOTOMY/LOBECTOMY  1999   carcinoid tumor  . TOTAL VAGINAL HYSTERECTOMY    . VARICOSE VEIN SURGERY      There were no vitals filed for this visit.        PT Long Term Goals - 10/14/19 3500      PT LONG TERM GOAL #1    Title Patient will be independent with HEP to continue benefits of therapy after discharge.    Baseline Dependent with exercise progression; 10/14/2019: not currently performing exercises    Time 6    Period Weeks    Status On-going      PT LONG TERM GOAL #2   Title Patient will improve TUG to under 12 seconds to indicate signficant improvement in fall risk.     Baseline TUG: 16 seconds; 10/14/2019: 26 sec    Time 6    Period Weeks    Status On-going      PT LONG TERM GOAL #3   Title Patient will improve 30minWT to over 338ft to indicate functional improvement in walking tolerance and greater ability to walk in stores before requiring a sitting rest break    Baseline 131ft stopped test at 55sec secondary to fatigue; 10/14/2019: 188ft in 80 sec    Time 6    Period Weeks    Status On-going      PT LONG TERM GOAL #4   Title Patient will demonstrate ability to stand for over 2 min to allow for improved ability to perform household chores at home.     Baseline able to stand for 21 sec; 10/14/2019: 30sec    Time 6    Period Weeks  Status On-going      PT LONG TERM GOAL #5   Title Pt will decrease 5TSTS to <12 seconds from a chair at 19in in height in order to demonstrate clinically significant improvement in LE strength    Baseline 5xSTS: 16 sec from 21" height chair with UE support; 10/14/2019: 14.5 sec 22"    Time 6    Period Weeks    Status On-going                  Patient will benefit from skilled therapeutic intervention in order to improve the following deficits and impairments:     Visit Diagnosis: Difficulty in walking, not elsewhere classified  Muscle weakness (generalized)  Unsteadiness on feet  History of falling  Other lack of coordination     Problem List Patient Active Problem List   Diagnosis Date Noted  . B12 deficiency 10/17/2019  . Folate deficiency 10/17/2019  . Anemia 08/29/2019  . Iron deficiency anemia 08/26/2019  . Chronic  anticoagulation 10/25/2018  . CKD stage G3b/A1, GFR 30-44 and albumin creatinine ratio <30 mg/g 10/05/2018  . MGUS (monoclonal gammopathy of unknown significance) 07/23/2018  . Osteopenia after menopause 07/16/2018  . Diabetes mellitus with no complication ([place]) 67/89/3810  . Vitamin D deficiency 04/07/2018  . Heart palpitations 01/06/2018  . Tachycardia 01/06/2018  . Cystocele with rectocele 10/30/2017  . Rectocele 10/30/2017  . Microcytic red blood cells 09/23/2017  . Localized edema 02/13/2017  . Hemorrhoids 05/17/2016  . Obesity (BMI 30-39.9) 04/26/2016  . Multiple lung nodules 04/19/2016  . Chest pain 04/17/2016  . Partial symptomatic epilepsy with complex partial seizures, not intractable, without status epilepticus ([place]) 06/30/2015  . Bronchiectasis without complication ([place]) 17/51/0258  . Imbalance 11/08/2014  . Essential (primary) hypertension 11/08/2014  . Personal history of malignant neoplasm of breast 11/08/2014  . History of malignant carcinoid tumor of bronchus and lung 11/08/2014  . H/O peptic ulcer 11/08/2014  . Asthma, mild intermittent 11/08/2014  . Pulmonary embolism ([place]) 11/08/2014  . Gonalgia 11/08/2014  . Leg varices 11/08/2014   10:51 AM, 12/16/19 Etta Grandchild, PT, DPT Physical Therapist - [place] [phone] (Office)    Arena Lindahl C 12/16/2019, 10:48 AM  [facility] [address], [place], [address] Phone: [phone]   Fax:  [phone]  Name: Rebecca Wolf MRN: [id number] Date of Birth: December 08, 1924

## 2019-12-16 NOTE — Therapy (Signed)
[facility] [address], [place], [address] Phone: [phone]   Fax:  [phone]  Physical Therapy Visit Note:  no treatment, session ended early  Patient Details  Name: Rebecca Wolf MRN: [id number] Date of Birth: [birth year] Referring Provider (PT): Erline Levine MD   Encounter Date: 12/16/2019   PT End of Session - 12/16/19 1546    PT Start Time 1038    PT Stop Time 1045    PT Time Calculation (min) 7 min           Past Medical History:  Diagnosis Date  . Asthma   . Breast cancer ([place]) 2003   LT LUMPECTOMY  . Breast cancer ([place]) 2004   LT LUMPECTOMY  . 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  . GERD (gastroesophageal reflux disease)   . Hemorrhoids   . Hypertension   . Personal history of chemotherapy 2004   BREAST CA  . Personal history of radiation therapy 2003   BREAST CA  . Personal history of radiation therapy 2004   BREAST CA  . Polyp of colon 2002   bleeding polyp of right ascending colon  . PUD (peptic ulcer disease)   . Seizures ([place])    epilepsy well controlled    Past Surgical History:  Procedure Laterality Date  . BREAST EXCISIONAL BIOPSY Left 2003   positive  . BREAST EXCISIONAL BIOPSY Left 2004   positive  . BREAST LUMPECTOMY Left 2003   BREAST CA  . BREAST LUMPECTOMY Left 2004   BREAST CA  . HEMICOLECTOMY Right    bowel obstruction  . HEMORRHOID SURGERY    . THORACOTOMY/LOBECTOMY  1999   carcinoid tumor  . TOTAL VAGINAL HYSTERECTOMY    . VARICOSE VEIN SURGERY      There were no vitals filed for this visit.   Subjective Assessment - 12/16/19 1545    Subjective Pt arrives for session, reports feeling fine today. Pt reports she feels benefit from working with PT, would like to conitnue to get stronger/faster. DTR requests to end session and reschedule before any subjective information can be collected.    Patient is accompained by: Family member              PT Long Term Goals - 10/14/19 0924      PT LONG TERM GOAL #1   Title Patient will be independent with HEP to continue benefits of therapy after discharge.    Baseline Dependent with exercise progression; 10/14/2019: not currently performing exercises    Time 6    Period Weeks    Status On-going      PT LONG TERM GOAL #2   Title Patient will improve TUG to under 12 seconds to indicate signficant improvement in fall risk.     Baseline TUG: 16 seconds; 10/14/2019: 26 sec    Time 6    Period Weeks    Status On-going      PT LONG TERM GOAL #3   Title Patient will improve 64minWT to over 374ft to indicate functional improvement in walking tolerance and greater ability to walk in stores before requiring a sitting rest break    Baseline 143ft stopped test at 55sec secondary to fatigue; 10/14/2019: 160ft in 80 sec    Time 6    Period Weeks    Status On-going      PT LONG TERM GOAL #4   Title Patient will demonstrate ability to stand for  over 2 min to allow for improved ability to perform household chores at home.     Baseline able to stand for 21 sec; 10/14/2019: 30sec    Time 6    Period Weeks    Status On-going      PT LONG TERM GOAL #5   Title Pt will decrease 5TSTS to <12 seconds from a chair at 19in in height in order to demonstrate clinically significant improvement in LE strength    Baseline 5xSTS: 16 sec from 21" height chair with UE support; 10/14/2019: 14.5 sec 22"    Time 6    Period Weeks    Status On-going                 Plan - 12/16/19 1547    Clinical Impression Statement Pt arrives a few weeks since last session, now outside of certiifcation period and in need of reassessment. Author asks DTR if she is able to attend session for first 5 minutes to give updates on pt's recent mobility, HEP, independence, progress, etc. DTR inititally ia agreeable, but then after moving her car returns to clinic and asks to end session and reschedule to another day  when pt's primary therapist is available. DTR later communicates to office staff discontent with being scheduled with a sub therapist, be scheduled for a reassessment date without being made aware prior to session, and having a student PT in attendance. DTR's wish is granted and session is ended.    PT Next Visit Plan Reassessment, certification    Consulted and Agree with Plan of Care Patient;Family member/caregiver    Family Member Consulted Daughter           Patient will benefit from skilled therapeutic intervention in order to improve the following deficits and impairments:     Visit Diagnosis: Difficulty in walking, not elsewhere classified  Muscle weakness (generalized)  Unsteadiness on feet  History of falling  Other lack of coordination     Problem List Patient Active Problem List   Diagnosis Date Noted  . B12 deficiency 10/17/2019  . Folate deficiency 10/17/2019  . Anemia 08/29/2019  . Iron deficiency anemia 08/26/2019  . Chronic anticoagulation 10/25/2018  . CKD stage G3b/A1, GFR 30-44 and albumin creatinine ratio <30 mg/g 10/05/2018  . MGUS (monoclonal gammopathy of unknown significance) 07/23/2018  . Osteopenia after menopause 07/16/2018  . Diabetes mellitus with no complication ([place]) 56/21/3086  . Vitamin D deficiency 04/07/2018  . Heart palpitations 01/06/2018  . Tachycardia 01/06/2018  . Cystocele with rectocele 10/30/2017  . Rectocele 10/30/2017  . Microcytic red blood cells 09/23/2017  . Localized edema 02/13/2017  . Hemorrhoids 05/17/2016  . Obesity (BMI 30-39.9) 04/26/2016  . Multiple lung nodules 04/19/2016  . Chest pain 04/17/2016  . Partial symptomatic epilepsy with complex partial seizures, not intractable, without status epilepticus ([place]) 06/30/2015  . Bronchiectasis without complication ([place]) 57/84/6962  . Imbalance 11/08/2014  . Essential (primary) hypertension 11/08/2014  . Personal history of malignant neoplasm of breast 11/08/2014  .  History of malignant carcinoid tumor of bronchus and lung 11/08/2014  . H/O peptic ulcer 11/08/2014  . Asthma, mild intermittent 11/08/2014  . Pulmonary embolism ([place]) 11/08/2014  . Gonalgia 11/08/2014  . Leg varices 11/08/2014   3:53 PM, 12/16/19 Etta Grandchild, PT, DPT Physical Therapist - [place] [phone] (Office)    Yishai Rehfeld C 12/16/2019, 3:52 PM  [facility] [address] [place], [address] Phone: [phone]  Fax:  [phone]  Name: MANAMI TUTOR MRN: [id number] Date of Birth: Jul 07, 1924

## 2019-12-21 ENCOUNTER — Other Ambulatory Visit: Payer: Self-pay

## 2019-12-21 ENCOUNTER — Ambulatory Visit (INDEPENDENT_AMBULATORY_CARE_PROVIDER_SITE_OTHER): Payer: Medicare Other | Admitting: Podiatry

## 2019-12-21 ENCOUNTER — Encounter: Payer: Self-pay | Admitting: Podiatry

## 2019-12-21 DIAGNOSIS — L989 Disorder of the skin and subcutaneous tissue, unspecified: Secondary | ICD-10-CM

## 2019-12-21 DIAGNOSIS — R600 Localized edema: Secondary | ICD-10-CM

## 2019-12-21 DIAGNOSIS — B351 Tinea unguium: Secondary | ICD-10-CM

## 2019-12-21 DIAGNOSIS — M79676 Pain in unspecified toe(s): Secondary | ICD-10-CM | POA: Diagnosis not present

## 2019-12-21 NOTE — Progress Notes (Signed)
° ° °  Subjective: Patient is a 86 y.o. female presenting to the office today with a chief complaint of painful callus lesion(s) noted to the bilateral feet that have been present for the past few months. Applying pressure to the lesions increases the pain. She has not done anything at home for treatment.  Patient also complains of elongated, thickened nails that cause pain while ambulating in shoes. She is unable to trim her own nails. Patient presents today for further treatment and evaluation.  Past Medical History:  Diagnosis Date  . Asthma   . Breast cancer ([facility]) 2003   LT LUMPECTOMY  . Breast cancer ([facility]) 2004   LT LUMPECTOMY  . 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  . GERD (gastroesophageal reflux disease)   . Hemorrhoids   . Hypertension   . Personal history of chemotherapy 2004   BREAST CA  . Personal history of radiation therapy 2003   BREAST CA  . Personal history of radiation therapy 2004   BREAST CA  . Polyp of colon 2002   bleeding polyp of right ascending colon  . PUD (peptic ulcer disease)   . Seizures ([facility])    epilepsy well controlled    Objective:  Physical Exam General: Alert and oriented x3 in no acute distress  Dermatology: Hyperkeratotic lesion(s) present on the bilateral feet. Pain on palpation with a central nucleated core noted. Skin is warm, dry and supple bilateral lower extremities. Negative for open lesions or macerations. Nails are tender, long, thickened and dystrophic with subungual debris, consistent with onychomycosis, 1-5 bilateral. No signs of infection noted.  Vascular: Palpable pedal pulses bilaterally. No edema or erythema noted. Capillary refill within normal limits.  Neurological: Epicritic and protective threshold grossly intact bilaterally.   Musculoskeletal Exam: Pain on palpation at the keratotic lesion(s) noted. Range of motion within normal limits bilateral. Muscle strength 5/5 in all groups bilateral.  Assessment: 1.  Onychodystrophic nails 1-5 bilateral with hyperkeratosis of nails.  2. Onychomycosis of nail due to dermatophyte bilateral 3. Pre-ulcerative callus lesions noted to the bilateral feet x 2   Plan of Care:  1. Patient evaluated. 2. Excisional debridement of keratoic lesion(s) using a chisel blade was performed without incident.  3. Dressed with light dressing. 4. Mechanical debridement of nails 1-5 bilaterally performed using a nail nipper. Filed with dremel without incident.  5.  Recommend daily compression socks.  Patient has the compression socks but does not wear them 6.  Patient is to return to the clinic in 3 months.   Shainna Faux M. Ottilia Pippenger, DPM [facility]  Dr. Merissa Renwick M. Song Garris, DPM    [address]                                        [place], [place] [address]                Office [phone]  Fax [phone]   

## 2019-12-22 ENCOUNTER — Ambulatory Visit: Payer: Medicare Other | Attending: Internal Medicine

## 2019-12-22 DIAGNOSIS — M6281 Muscle weakness (generalized): Secondary | ICD-10-CM | POA: Diagnosis present

## 2019-12-22 DIAGNOSIS — R2681 Unsteadiness on feet: Secondary | ICD-10-CM | POA: Diagnosis not present

## 2019-12-22 DIAGNOSIS — R278 Other lack of coordination: Secondary | ICD-10-CM | POA: Diagnosis not present

## 2019-12-22 DIAGNOSIS — R262 Difficulty in walking, not elsewhere classified: Secondary | ICD-10-CM | POA: Diagnosis not present

## 2019-12-22 DIAGNOSIS — Z9181 History of falling: Secondary | ICD-10-CM | POA: Diagnosis not present

## 2019-12-22 NOTE — Therapy (Signed)
[facility] [address], [place], [address] Phone: [phone]   Fax:  [phone]  Physical Therapy Treatment/ Progress Note  Patient Details  Name: Rebecca Wolf MRN: [id number] Date of Birth: [birth year] Referring Provider (PT): Erline Levine MD  Reporting period: 10/14/2019 - 12/22/2019  Encounter Date: 12/22/2019   PT End of Session - 12/22/19 0930    Visit Number 10    Number of Visits 13    Date for PT Re-Evaluation 11/25/19    Authorization Type 10/ 10 Medicare    PT Start Time 0900    PT Stop Time 0945    PT Time Calculation (min) 45 min    Equipment Utilized During Treatment Gait belt    Activity Tolerance Patient tolerated treatment well;Patient limited by fatigue    Behavior During Therapy [facility] for tasks assessed/performed           Past Medical History:  Diagnosis Date  . Asthma   . Breast cancer ([place]) 2003   LT LUMPECTOMY  . Breast cancer ([place]) 2004   LT LUMPECTOMY  . 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  . GERD (gastroesophageal reflux disease)   . Hemorrhoids   . Hypertension   . Personal history of chemotherapy 2004   BREAST CA  . Personal history of radiation therapy 2003   BREAST CA  . Personal history of radiation therapy 2004   BREAST CA  . Polyp of colon 2002   bleeding polyp of right ascending colon  . PUD (peptic ulcer disease)   . Seizures ([place])    epilepsy well controlled    Past Surgical History:  Procedure Laterality Date  . BREAST EXCISIONAL BIOPSY Left 2003   positive  . BREAST EXCISIONAL BIOPSY Left 2004   positive  . BREAST LUMPECTOMY Left 2003   BREAST CA  . BREAST LUMPECTOMY Left 2004   BREAST CA  . HEMICOLECTOMY Right    bowel obstruction  . HEMORRHOID SURGERY    . THORACOTOMY/LOBECTOMY  1999   carcinoid tumor  . TOTAL VAGINAL HYSTERECTOMY    . VARICOSE VEIN SURGERY      There were no vitals filed for this visit.    Subjective Assessment - 12/22/19 0912    Subjective Patient states no major changes since the previous session. States shes she would like to continue on working on getting her legs stronger.    Patient is accompained by: Family member    Patient Stated Goals To move easier    Currently in Pain? No/denies              TREATMENT Therapeutic Exercise  Standing for time without UE support - 2 x 30sec Sit to stands with UE support - x5 Sit to stands with unilateral UE support - x 5 Ambulation with focus on improving speed and decreasing time to performance - 259ft with rollator walker Sit to stand followed by ambulation of 20' - x 2  NuStep with level 3 resistance focused on improving speed - 6 min to improve LE muscular endurance Performed exercises to improve strength and muscular endurance    PT Education - 12/22/19 0930    Education Details form/technique with exercise    Person(s) Educated Patient    Methods Explanation;Demonstration    Comprehension Verbalized understanding;Returned demonstration               PT Long Term Goals - 12/22/19 9381  PT LONG TERM GOAL #1   Title Patient will be independent with HEP to continue benefits of therapy after discharge.    Baseline Dependent with exercise progression; 10/14/2019: not currently performing exercises; 12/22/2019: States she'll begin walking with the change of the weather    Time 6    Period Weeks    Status On-going      PT LONG TERM GOAL #2   Title Patient will improve TUG to under 12 seconds to indicate signficant improvement in fall risk.     Baseline TUG: 16 seconds; 10/14/2019: 26 sec; 12/22/2019: 21.3 sec    Time 6    Period Weeks    Status On-going      PT LONG TERM GOAL #3   Title Patient will improve 35minWT to over 370ft to indicate functional improvement in walking tolerance and greater ability to walk in stores before requiring a sitting rest break    Baseline 117ft stopped test at 55sec secondary  to fatigue; 10/14/2019: 163ft in 80 sec; 12/22/2019: 232ft 106sec    Time 6    Period Weeks    Status On-going      PT LONG TERM GOAL #4   Title Patient will demonstrate ability to stand for over 2 min to allow for improved ability to perform household chores at home.     Baseline able to stand for 21 sec; 10/14/2019: 30sec; 12/22/2019: 30 sec    Time 6    Period Weeks    Status On-going      PT LONG TERM GOAL #5   Title Pt will decrease 5TSTS to <12 seconds from a chair at 19in in height in order to demonstrate clinically significant improvement in LE strength    Baseline 5xSTS: 16 sec from 21" height chair with UE support; 10/14/2019: 14.5 sec 22"; 12/22/2019: with B UE support, 24.7 w/o UE support and PT assistance    Time 6    Period Weeks    Status On-going                 Plan - 12/22/19 1301    Clinical Impression Statement Patient is making improvements towards long term goals with increased distance with performing 84minwalk test decreased time with performing 5xSTS and performing the TUG compared the previous sessions. Although patient is improving, she conitnues to have difficulty with performing standing for time, functional LE strength. Patient will beenfit from further skilled therapy to return to prior level of function.    PT Next Visit Plan Reassessment, certification    Consulted and Agree with Plan of Care Patient;Family member/caregiver    Family Member Consulted Daughter           Patient will benefit from skilled therapeutic intervention in order to improve the following deficits and impairments:     Visit Diagnosis: Difficulty in walking, not elsewhere classified  Muscle weakness (generalized)     Problem List Patient Active Problem List   Diagnosis Date Noted  . B12 deficiency 10/17/2019  . Folate deficiency 10/17/2019  . Anemia 08/29/2019  . Iron deficiency anemia 08/26/2019  . Chronic anticoagulation 10/25/2018  . CKD stage G3b/A1, GFR 30-44  and albumin creatinine ratio <30 mg/g 10/05/2018  . MGUS (monoclonal gammopathy of unknown significance) 07/23/2018  . Osteopenia after menopause 07/16/2018  . Diabetes mellitus with no complication ([place]) 67/20/9470  . Vitamin D deficiency 04/07/2018  . Heart palpitations 01/06/2018  . Tachycardia 01/06/2018  . Cystocele with rectocele 10/30/2017  . Rectocele 10/30/2017  .  Microcytic red blood cells 09/23/2017  . Localized edema 02/13/2017  . Hemorrhoids 05/17/2016  . Obesity (BMI 30-39.9) 04/26/2016  . Multiple lung nodules 04/19/2016  . Chest pain 04/17/2016  . Partial symptomatic epilepsy with complex partial seizures, not intractable, without status epilepticus ([place]) 06/30/2015  . Bronchiectasis without complication ([place]) 94/83/4758  . Imbalance 11/08/2014  . Essential (primary) hypertension 11/08/2014  . Personal history of malignant neoplasm of breast 11/08/2014  . History of malignant carcinoid tumor of bronchus and lung 11/08/2014  . H/O peptic ulcer 11/08/2014  . Asthma, mild intermittent 11/08/2014  . Pulmonary embolism ([place]) 11/08/2014  . Gonalgia 11/08/2014  . Leg varices 11/08/2014    Blythe Stanford, PT DPT 12/22/2019, 1:18 PM  [facility] [address], [place], [address] Phone: [phone]   Fax:  [phone]  Name: Rebecca Wolf MRN: [id number] Date of Birth: [birth year]

## 2019-12-29 ENCOUNTER — Ambulatory Visit: Payer: Medicare Other

## 2020-01-06 ENCOUNTER — Ambulatory Visit: Payer: Medicare Other | Attending: Internal Medicine

## 2020-01-06 ENCOUNTER — Other Ambulatory Visit: Payer: Self-pay

## 2020-01-06 DIAGNOSIS — R2681 Unsteadiness on feet: Secondary | ICD-10-CM | POA: Diagnosis present

## 2020-01-06 DIAGNOSIS — M6281 Muscle weakness (generalized): Secondary | ICD-10-CM

## 2020-01-06 DIAGNOSIS — R262 Difficulty in walking, not elsewhere classified: Secondary | ICD-10-CM | POA: Diagnosis present

## 2020-01-06 NOTE — Therapy (Signed)
[facility] [address] [place], [address] Phone: [phone]   Fax:  [phone]  Physical Therapy Treatment  Patient Details  Name: Rebecca Wolf MRN: [id number] Date of Birth: 1924-09-23 Referring Provider (PT): Erline Levine MD   Encounter Date: 01/06/2020   PT End of Session - 01/06/20 1448    Visit Number 11    Number of Visits 13    Date for PT Re-Evaluation 02/16/20    Authorization Type 1 / 10 Medicare    PT Start Time 0845    PT Stop Time 0930    PT Time Calculation (min) 45 min    Equipment Utilized During Treatment Gait belt    Activity Tolerance Patient tolerated treatment well;Patient limited by fatigue    Behavior During Therapy [facility] for tasks assessed/performed           Past Medical History:  Diagnosis Date  . Asthma   . Breast cancer ([place]) 2003   LT LUMPECTOMY  . Breast cancer ([place]) 2004   LT LUMPECTOMY  . 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  . GERD (gastroesophageal reflux disease)   . Hemorrhoids   . Hypertension   . Personal history of chemotherapy 2004   BREAST CA  . Personal history of radiation therapy 2003   BREAST CA  . Personal history of radiation therapy 2004   BREAST CA  . Polyp of colon 2002   bleeding polyp of right ascending colon  . PUD (peptic ulcer disease)   . Seizures ([place])    epilepsy well controlled    Past Surgical History:  Procedure Laterality Date  . BREAST EXCISIONAL BIOPSY Left 2003   positive  . BREAST EXCISIONAL BIOPSY Left 2004   positive  . BREAST LUMPECTOMY Left 2003   BREAST CA  . BREAST LUMPECTOMY Left 2004   BREAST CA  . HEMICOLECTOMY Right    bowel obstruction  . HEMORRHOID SURGERY    . THORACOTOMY/LOBECTOMY  1999   carcinoid tumor  . TOTAL VAGINAL HYSTERECTOMY    . VARICOSE VEIN SURGERY      There were no vitals filed for this visit.   Subjective Assessment - 01/06/20 0913    Subjective Patient  reports no major changes since the previous session. Patient states she tried to do some walking over the weekend, but hasn't been able to secondary to the heat. No pain currently.    Patient is accompained by: Family member    Patient Stated Goals To move easier             TREATMENT Therapeutic Exercise Side step ups on 6" step with UE support -- x 5 B Nustep in sitting level 3 with patient positioned -- x 10 min with focus on speed Ambulation with use of rollator -- x 145ft  Step ups forward 4 steps -- x 1   Performed to increased strength    PT Education - 01/06/20 1448    Education Details form/technique with exercise    Person(s) Educated Patient    Methods Explanation;Demonstration    Comprehension Verbalized understanding;Returned demonstration               PT Long Term Goals - 12/22/19 0934      PT LONG TERM GOAL #1   Title Patient will be independent with HEP to continue benefits of therapy after discharge.    Baseline Dependent with exercise progression; 10/14/2019: not currently performing exercises; 12/22/2019:  States she'll begin walking with the change of the weather    Time 6    Period Weeks    Status On-going      PT LONG TERM GOAL #2   Title Patient will improve TUG to under 12 seconds to indicate signficant improvement in fall risk.     Baseline TUG: 16 seconds; 10/14/2019: 26 sec; 12/22/2019: 21.3 sec    Time 6    Period Weeks    Status On-going      PT LONG TERM GOAL #3   Title Patient will improve 91minWT to over 343ft to indicate functional improvement in walking tolerance and greater ability to walk in stores before requiring a sitting rest break    Baseline 190ft stopped test at 55sec secondary to fatigue; 10/14/2019: 140ft in 80 sec; 12/22/2019: 271ft 106sec    Time 6    Period Weeks    Status On-going      PT LONG TERM GOAL #4   Title Patient will demonstrate ability to stand for over 2 min to allow for improved ability to perform  household chores at home.     Baseline able to stand for 21 sec; 10/14/2019: 30sec; 12/22/2019: 30 sec    Time 6    Period Weeks    Status On-going      PT LONG TERM GOAL #5   Title Pt will decrease 5TSTS to <12 seconds from a chair at 19in in height in order to demonstrate clinically significant improvement in LE strength    Baseline 5xSTS: 16 sec from 21" height chair with UE support; 10/14/2019: 14.5 sec 22"; 12/22/2019: with B UE support, 24.7 w/o UE support and PT assistance    Time 6    Period Weeks    Status On-going                 Plan - 01/06/20 1454    Clinical Impression Statement Performed exercises today to address LE strength and balance, patient fatigues quickly during today's session requiring greater amount of seated rest breaks compared to previous sessions. Decreased DOE, most likely secondary to no intake of food before the day's session. Will conitnue to focus on improving limitations to improve pain and return to prior level of function.    PT Next Visit Plan Reassessment, certification    Consulted and Agree with Plan of Care Patient;Family member/caregiver    Family Member Consulted Daughter           Patient will benefit from skilled therapeutic intervention in order to improve the following deficits and impairments:     Visit Diagnosis: Difficulty in walking, not elsewhere classified  Muscle weakness (generalized)  Unsteadiness on feet     Problem List Patient Active Problem List   Diagnosis Date Noted  . B12 deficiency 10/17/2019  . Folate deficiency 10/17/2019  . Anemia 08/29/2019  . Iron deficiency anemia 08/26/2019  . Chronic anticoagulation 10/25/2018  . CKD stage G3b/A1, GFR 30-44 and albumin creatinine ratio <30 mg/g 10/05/2018  . MGUS (monoclonal gammopathy of unknown significance) 07/23/2018  . Osteopenia after menopause 07/16/2018  . Diabetes mellitus with no complication ([place]) 76/19/5093  . Vitamin D deficiency 04/07/2018  .  Heart palpitations 01/06/2018  . Tachycardia 01/06/2018  . Cystocele with rectocele 10/30/2017  . Rectocele 10/30/2017  . Microcytic red blood cells 09/23/2017  . Localized edema 02/13/2017  . Hemorrhoids 05/17/2016  . Obesity (BMI 30-39.9) 04/26/2016  . Multiple lung nodules 04/19/2016  . Chest pain 04/17/2016  .  Partial symptomatic epilepsy with complex partial seizures, not intractable, without status epilepticus ([place]) 06/30/2015  . Bronchiectasis without complication ([place]) 81/85/9093  . Imbalance 11/08/2014  . Essential (primary) hypertension 11/08/2014  . Personal history of malignant neoplasm of breast 11/08/2014  . History of malignant carcinoid tumor of bronchus and lung 11/08/2014  . H/O peptic ulcer 11/08/2014  . Asthma, mild intermittent 11/08/2014  . Pulmonary embolism ([place]) 11/08/2014  . Gonalgia 11/08/2014  . Leg varices 11/08/2014    Blythe Stanford, PT DPT 01/06/2020, 2:57 PM  [facility] [address] [place], [address] Phone: [phone]   Fax:  [phone]  Name: KNIYAH KHUN MRN: [id number] Date of Birth: [birth year]

## 2020-01-06 NOTE — Addendum Note (Signed)
Addended by: Blain Pais on: 01/06/2020 02:53 PM   Modules accepted: Orders

## 2020-01-10 ENCOUNTER — Inpatient Hospital Stay: Payer: Medicare Other

## 2020-01-10 ENCOUNTER — Other Ambulatory Visit: Payer: Self-pay

## 2020-01-10 ENCOUNTER — Other Ambulatory Visit: Payer: Medicare Other

## 2020-01-10 ENCOUNTER — Inpatient Hospital Stay: Payer: Medicare Other | Attending: Hematology and Oncology

## 2020-01-10 DIAGNOSIS — D472 Monoclonal gammopathy: Secondary | ICD-10-CM

## 2020-01-10 DIAGNOSIS — D509 Iron deficiency anemia, unspecified: Secondary | ICD-10-CM | POA: Diagnosis present

## 2020-01-10 DIAGNOSIS — E538 Deficiency of other specified B group vitamins: Secondary | ICD-10-CM | POA: Diagnosis present

## 2020-01-10 LAB — CBC WITH DIFFERENTIAL/PLATELET
Abs Immature Granulocytes: 0.04 10*3/uL (ref 0.00–0.07)
Basophils Absolute: 0 10*3/uL (ref 0.0–0.1)
Basophils Relative: 0 %
Eosinophils Absolute: 0.2 10*3/uL (ref 0.0–0.5)
Eosinophils Relative: 2 %
HCT: 35.7 % — ABNORMAL LOW (ref 36.0–46.0)
Hemoglobin: 11.8 g/dL — ABNORMAL LOW (ref 12.0–15.0)
Immature Granulocytes: 1 %
Lymphocytes Relative: 43 %
Lymphs Abs: 3 10*3/uL (ref 0.7–4.0)
MCH: 25.4 pg — ABNORMAL LOW (ref 26.0–34.0)
MCHC: 33.1 g/dL (ref 30.0–36.0)
MCV: 76.8 fL — ABNORMAL LOW (ref 80.0–100.0)
Monocytes Absolute: 0.5 10*3/uL (ref 0.1–1.0)
Monocytes Relative: 6 %
Neutro Abs: 3.4 10*3/uL (ref 1.7–7.7)
Neutrophils Relative %: 48 %
Platelets: 225 10*3/uL (ref 150–400)
RBC: 4.65 MIL/uL (ref 3.87–5.11)
RDW: 15.3 % (ref 11.5–15.5)
WBC: 7.1 10*3/uL (ref 4.0–10.5)
nRBC: 0 % (ref 0.0–0.2)

## 2020-01-10 LAB — FERRITIN: Ferritin: 57 ng/mL (ref 11–307)

## 2020-01-10 MED ORDER — CYANOCOBALAMIN 1000 MCG/ML IJ SOLN
1000.0000 ug | Freq: Once | INTRAMUSCULAR | Status: AC
  Administered 2020-01-10: 1000 ug via INTRAMUSCULAR
  Filled 2020-01-10: qty 1

## 2020-01-11 LAB — PROTEIN ELECTROPHORESIS, SERUM
A/G Ratio: 1.1 (ref 0.7–1.7)
Albumin ELP: 3.3 g/dL (ref 2.9–4.4)
Alpha-1-Globulin: 0.2 g/dL (ref 0.0–0.4)
Alpha-2-Globulin: 0.9 g/dL (ref 0.4–1.0)
Beta Globulin: 1 g/dL (ref 0.7–1.3)
Gamma Globulin: 1 g/dL (ref 0.4–1.8)
Globulin, Total: 3 g/dL (ref 2.2–3.9)
Total Protein ELP: 6.3 g/dL (ref 6.0–8.5)

## 2020-01-13 ENCOUNTER — Other Ambulatory Visit: Payer: Self-pay

## 2020-01-13 ENCOUNTER — Ambulatory Visit: Payer: Medicare Other

## 2020-01-13 VITALS — BP 139/68 | HR 90

## 2020-01-13 DIAGNOSIS — R2681 Unsteadiness on feet: Secondary | ICD-10-CM

## 2020-01-13 DIAGNOSIS — R262 Difficulty in walking, not elsewhere classified: Secondary | ICD-10-CM

## 2020-01-13 DIAGNOSIS — M6281 Muscle weakness (generalized): Secondary | ICD-10-CM

## 2020-01-13 NOTE — Therapy (Signed)
[place] [facility] [address] [place], [address] Phone: [phone]   Fax:  [phone]  Physical Therapy Treatment  Patient Details  Name: Rebecca Wolf MRN: [id number] Date of Birth: 1924-08-25 Referring Provider (PT): Erline Levine MD   Encounter Date: 01/13/2020   PT End of Session - 01/13/20 1035    Visit Number 12    Number of Visits 21    Date for PT Re-Evaluation 02/16/20    Authorization Type 2 / 10 Medicare    PT Start Time 0930    PT Stop Time 1015    PT Time Calculation (min) 45 min    Equipment Utilized During Treatment Gait belt    Activity Tolerance Patient tolerated treatment well;Patient limited by fatigue    Behavior During Therapy [facility] for tasks assessed/performed           Past Medical History:  Diagnosis Date  . Asthma   . Breast cancer ([place]) 2003   LT LUMPECTOMY  . Breast cancer ([place]) 2004   LT LUMPECTOMY  . 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  . GERD (gastroesophageal reflux disease)   . Hemorrhoids   . Hypertension   . Personal history of chemotherapy 2004   BREAST CA  . Personal history of radiation therapy 2003   BREAST CA  . Personal history of radiation therapy 2004   BREAST CA  . Polyp of colon 2002   bleeding polyp of right ascending colon  . PUD (peptic ulcer disease)   . Seizures ([place])    epilepsy well controlled    Past Surgical History:  Procedure Laterality Date  . BREAST EXCISIONAL BIOPSY Left 2003   positive  . BREAST EXCISIONAL BIOPSY Left 2004   positive  . BREAST LUMPECTOMY Left 2003   BREAST CA  . BREAST LUMPECTOMY Left 2004   BREAST CA  . HEMICOLECTOMY Right    bowel obstruction  . HEMORRHOID SURGERY    . THORACOTOMY/LOBECTOMY  1999   carcinoid tumor  . TOTAL VAGINAL HYSTERECTOMY    . VARICOSE VEIN SURGERY      Vitals:   01/13/20 0952  BP: 139/68  Pulse: 90     Subjective Assessment - 01/13/20 1019    Subjective  Patient states she is feeling better than she did the previous session. Patient reports no major changes otherwise.    Patient is accompained by: Family member    Patient Stated Goals To move easier    Currently in Pain? No/denies               TREATMENT Therapeutic Exercise Hip extension in standing with UE support - x 10  Heel raises in standing with UE support - x 10  Nustep in sitting level 3 with patient positioned -- x 10 min with focus on speed Ambulation with use of rollator -- x 180ft, x48ft  Hip abduction in standing with UE support - x 20 Side stepping up and over 6" step - x 20    Performed to increased strength       PT Education - 01/13/20 1034    Education Details form/technique with exercise    Person(s) Educated Patient    Methods Explanation;Demonstration    Comprehension Verbalized understanding;Returned demonstration               PT Long Term Goals - 12/22/19 0934      PT LONG TERM GOAL #1   Title Patient  will be independent with HEP to continue benefits of therapy after discharge.    Baseline Dependent with exercise progression; 10/14/2019: not currently performing exercises; 12/22/2019: States she'll begin walking with the change of the weather    Time 6    Period Weeks    Status On-going      PT LONG TERM GOAL #2   Title Patient will improve TUG to under 12 seconds to indicate signficant improvement in fall risk.     Baseline TUG: 16 seconds; 10/14/2019: 26 sec; 12/22/2019: 21.3 sec    Time 6    Period Weeks    Status On-going      PT LONG TERM GOAL #3   Title Patient will improve 97minWT to over 39ft to indicate functional improvement in walking tolerance and greater ability to walk in stores before requiring a sitting rest break    Baseline 169ft stopped test at 55sec secondary to fatigue; 10/14/2019: 193ft in 80 sec; 12/22/2019: 235ft 106sec    Time 6    Period Weeks    Status On-going      PT LONG TERM GOAL #4   Title Patient will  demonstrate ability to stand for over 2 min to allow for improved ability to perform household chores at home.     Baseline able to stand for 21 sec; 10/14/2019: 30sec; 12/22/2019: 30 sec    Time 6    Period Weeks    Status On-going      PT LONG TERM GOAL #5   Title Pt will decrease 5TSTS to <12 seconds from a chair at 19in in height in order to demonstrate clinically significant improvement in LE strength    Baseline 5xSTS: 16 sec from 21" height chair with UE support; 10/14/2019: 14.5 sec 22"; 12/22/2019: with B UE support, 24.7 w/o UE support and PT assistance    Time 6    Period Weeks    Status On-going                 Plan - 01/13/20 1036    Clinical Impression Statement Continued to focus on improving LE strength and stabilization during today's session. Patient demonstrates improvement in terms on improving functional strength and ambulation endurance. Patient demonstrates improvement with ambulation as she was able to ambulate for a prolonged period of time. Patient will benefit from further skilled therapy to return to prior level of function.    PT Next Visit Plan Reassessment, certification    Consulted and Agree with Plan of Care Patient;Family member/caregiver    Family Member Consulted Daughter           Patient will benefit from skilled therapeutic intervention in order to improve the following deficits and impairments:     Visit Diagnosis: Difficulty in walking, not elsewhere classified  Muscle weakness (generalized)  Unsteadiness on feet     Problem List Patient Active Problem List   Diagnosis Date Noted  . B12 deficiency 10/17/2019  . Folate deficiency 10/17/2019  . Anemia 08/29/2019  . Iron deficiency anemia 08/26/2019  . Chronic anticoagulation 10/25/2018  . CKD stage G3b/A1, GFR 30-44 and albumin creatinine ratio <30 mg/g 10/05/2018  . MGUS (monoclonal gammopathy of unknown significance) 07/23/2018  . Osteopenia after menopause 07/16/2018  .  Diabetes mellitus with no complication ([place]) 03/50/0938  . Vitamin D deficiency 04/07/2018  . Heart palpitations 01/06/2018  . Tachycardia 01/06/2018  . Cystocele with rectocele 10/30/2017  . Rectocele 10/30/2017  . Microcytic red blood cells 09/23/2017  . Localized  edema 02/13/2017  . Hemorrhoids 05/17/2016  . Obesity (BMI 30-39.9) 04/26/2016  . Multiple lung nodules 04/19/2016  . Chest pain 04/17/2016  . Partial symptomatic epilepsy with complex partial seizures, not intractable, without status epilepticus ([place]) 06/30/2015  . Bronchiectasis without complication ([place]) 84/08/9793  . Imbalance 11/08/2014  . Essential (primary) hypertension 11/08/2014  . Personal history of malignant neoplasm of breast 11/08/2014  . History of malignant carcinoid tumor of bronchus and lung 11/08/2014  . H/O peptic ulcer 11/08/2014  . Asthma, mild intermittent 11/08/2014  . Pulmonary embolism ([place]) 11/08/2014  . Gonalgia 11/08/2014  . Leg varices 11/08/2014    Blythe Stanford, PT DPT 01/13/2020, 10:46 AM  [place] [facility] [address] [place], [address] Phone: [phone]   Fax:  [phone]  Name: Rebecca Wolf MRN: [id number] Date of Birth: Nov 18, 1924

## 2020-01-20 ENCOUNTER — Ambulatory Visit: Payer: Medicare Other

## 2020-01-27 ENCOUNTER — Ambulatory Visit: Payer: Medicare Other

## 2020-02-01 ENCOUNTER — Other Ambulatory Visit: Payer: Self-pay

## 2020-02-01 ENCOUNTER — Ambulatory Visit: Payer: Medicare Other | Attending: Internal Medicine

## 2020-02-01 DIAGNOSIS — M6281 Muscle weakness (generalized): Secondary | ICD-10-CM | POA: Diagnosis present

## 2020-02-01 DIAGNOSIS — R262 Difficulty in walking, not elsewhere classified: Secondary | ICD-10-CM | POA: Diagnosis not present

## 2020-02-01 NOTE — Therapy (Signed)
[place] [facility] [address] [place], [address] Phone: [phone]   Fax:  [phone]  Physical Therapy Treatment  Patient Details  Name: Rebecca Wolf MRN: [id number] Date of Birth: 04-29-1925 Referring Provider (PT): Erline Levine MD   Encounter Date: 02/01/2020   PT End of Session - 02/01/20 1141    Visit Number 13    Number of Visits 21    Date for PT Re-Evaluation 02/16/20    Authorization Type 3 / 10 Medicare    PT Start Time 1448    PT Stop Time 1215    PT Time Calculation (min) 40 min    Equipment Utilized During Treatment Gait belt    Activity Tolerance Patient tolerated treatment well;Patient limited by fatigue    Behavior During Therapy [facility] for tasks assessed/performed           Past Medical History:  Diagnosis Date  . Asthma   . Breast cancer ([place]) 2003   LT LUMPECTOMY  . Breast cancer ([place]) 2004   LT LUMPECTOMY  . 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  . GERD (gastroesophageal reflux disease)   . Hemorrhoids   . Hypertension   . Personal history of chemotherapy 2004   BREAST CA  . Personal history of radiation therapy 2003   BREAST CA  . Personal history of radiation therapy 2004   BREAST CA  . Polyp of colon 2002   bleeding polyp of right ascending colon  . PUD (peptic ulcer disease)   . Seizures ([place])    epilepsy well controlled    Past Surgical History:  Procedure Laterality Date  . BREAST EXCISIONAL BIOPSY Left 2003   positive  . BREAST EXCISIONAL BIOPSY Left 2004   positive  . BREAST LUMPECTOMY Left 2003   BREAST CA  . BREAST LUMPECTOMY Left 2004   BREAST CA  . HEMICOLECTOMY Right    bowel obstruction  . HEMORRHOID SURGERY    . THORACOTOMY/LOBECTOMY  1999   carcinoid tumor  . TOTAL VAGINAL HYSTERECTOMY    . VARICOSE VEIN SURGERY      There were no vitals filed for this visit.   Subjective Assessment - 02/01/20 1140    Subjective Patient  reports she has been feeling "okay" no other updates otherwise.    Patient is accompained by: Family member    Patient Stated Goals To move easier    Currently in Pain? No/denies             TREATMENT Therapeutic Exercise Hip extension in standing with UE support - x 20  Heel raises in standing with UE support - x 20  Nustep in sitting level 3 with patient positioned -- x 10 min with focus on speed Ambulation with use of rollator -- x 260ft Hip abduction in standing with UE support - x 20   Performed to increased strength      PT Education - 02/01/20 1140    Education Details form/technique with exercise    Person(s) Educated Patient    Methods Explanation;Demonstration    Comprehension Verbalized understanding;Returned demonstration               PT Long Term Goals - 12/22/19 0934      PT LONG TERM GOAL #1   Title Patient will be independent with HEP to continue benefits of therapy after discharge.    Baseline Dependent with exercise progression; 10/14/2019: not currently performing exercises; 12/22/2019: States she'll  begin walking with the change of the weather    Time 6    Period Weeks    Status On-going      PT LONG TERM GOAL #2   Title Patient will improve TUG to under 12 seconds to indicate signficant improvement in fall risk.     Baseline TUG: 16 seconds; 10/14/2019: 26 sec; 12/22/2019: 21.3 sec    Time 6    Period Weeks    Status On-going      PT LONG TERM GOAL #3   Title Patient will improve 47minWT to over 355ft to indicate functional improvement in walking tolerance and greater ability to walk in stores before requiring a sitting rest break    Baseline 162ft stopped test at 55sec secondary to fatigue; 10/14/2019: 133ft in 80 sec; 12/22/2019: 244ft 106sec    Time 6    Period Weeks    Status On-going      PT LONG TERM GOAL #4   Title Patient will demonstrate ability to stand for over 2 min to allow for improved ability to perform household chores at  home.     Baseline able to stand for 21 sec; 10/14/2019: 30sec; 12/22/2019: 30 sec    Time 6    Period Weeks    Status On-going      PT LONG TERM GOAL #5   Title Pt will decrease 5TSTS to <12 seconds from a chair at 19in in height in order to demonstrate clinically significant improvement in LE strength    Baseline 5xSTS: 16 sec from 21" height chair with UE support; 10/14/2019: 14.5 sec 22"; 12/22/2019: with B UE support, 24.7 w/o UE support and PT assistance    Time 6    Period Weeks    Status On-going                 Plan - 02/01/20 1456    Clinical Impression Statement Improvement today compared to previous session with ambulation and exercises performed. Patient able to perform exercises with repetitions of 20 and improvement in ambulation distance of 200 ft. Although patient is improving, she conitnues to have an increase in fall risk as indicated by necessity of heavy use of hands with the  rollator walker. Patient will benefit from further skilled therapy foucsed on improving these limitations to return to prior level of function.    PT Next Visit Plan Reassessment, certification    Consulted and Agree with Plan of Care Patient;Family member/caregiver    Family Member Consulted Daughter           Patient will benefit from skilled therapeutic intervention in order to improve the following deficits and impairments:     Visit Diagnosis: Difficulty in walking, not elsewhere classified  Muscle weakness (generalized)     Problem List Patient Active Problem List   Diagnosis Date Noted  . B12 deficiency 10/17/2019  . Folate deficiency 10/17/2019  . Anemia 08/29/2019  . Iron deficiency anemia 08/26/2019  . Chronic anticoagulation 10/25/2018  . CKD stage G3b/A1, GFR 30-44 and albumin creatinine ratio <30 mg/g 10/05/2018  . MGUS (monoclonal gammopathy of unknown significance) 07/23/2018  . Osteopenia after menopause 07/16/2018  . Diabetes mellitus with no complication  ([place]) 18/29/9371  . Vitamin D deficiency 04/07/2018  . Heart palpitations 01/06/2018  . Tachycardia 01/06/2018  . Cystocele with rectocele 10/30/2017  . Rectocele 10/30/2017  . Microcytic red blood cells 09/23/2017  . Localized edema 02/13/2017  . Hemorrhoids 05/17/2016  . Obesity (BMI 30-39.9) 04/26/2016  .  Multiple lung nodules 04/19/2016  . Chest pain 04/17/2016  . Partial symptomatic epilepsy with complex partial seizures, not intractable, without status epilepticus ([place]) 06/30/2015  . Bronchiectasis without complication ([place]) 56/38/7564  . Imbalance 11/08/2014  . Essential (primary) hypertension 11/08/2014  . Personal history of malignant neoplasm of breast 11/08/2014  . History of malignant carcinoid tumor of bronchus and lung 11/08/2014  . H/O peptic ulcer 11/08/2014  . Asthma, mild intermittent 11/08/2014  . Pulmonary embolism ([place]) 11/08/2014  . Gonalgia 11/08/2014  . Leg varices 11/08/2014    Blythe Stanford, PT DPT 02/01/2020, 3:13 PM  [place] [address], [place], [address] Phone: [phone]   Fax:  [phone]  Name: Rebecca Wolf MRN: [id number] Date of Birth: 05-Dec-1924

## 2020-02-07 ENCOUNTER — Inpatient Hospital Stay: Payer: Medicare Other

## 2020-02-08 ENCOUNTER — Ambulatory Visit: Payer: Medicare Other

## 2020-02-10 ENCOUNTER — Inpatient Hospital Stay: Payer: Medicare Other | Attending: Hematology and Oncology

## 2020-02-10 ENCOUNTER — Other Ambulatory Visit: Payer: Self-pay

## 2020-02-10 DIAGNOSIS — E538 Deficiency of other specified B group vitamins: Secondary | ICD-10-CM | POA: Insufficient documentation

## 2020-02-10 DIAGNOSIS — D509 Iron deficiency anemia, unspecified: Secondary | ICD-10-CM

## 2020-02-10 MED ORDER — CYANOCOBALAMIN 1000 MCG/ML IJ SOLN
1000.0000 ug | Freq: Once | INTRAMUSCULAR | Status: AC
  Administered 2020-02-10: 1000 ug via INTRAMUSCULAR
  Filled 2020-02-10: qty 1

## 2020-02-22 ENCOUNTER — Ambulatory Visit: Payer: Medicare Other

## 2020-03-01 ENCOUNTER — Ambulatory Visit: Payer: Medicare Other | Attending: Internal Medicine

## 2020-03-01 ENCOUNTER — Other Ambulatory Visit: Payer: Self-pay

## 2020-03-01 DIAGNOSIS — R278 Other lack of coordination: Secondary | ICD-10-CM | POA: Insufficient documentation

## 2020-03-01 DIAGNOSIS — R262 Difficulty in walking, not elsewhere classified: Secondary | ICD-10-CM

## 2020-03-01 DIAGNOSIS — M6281 Muscle weakness (generalized): Secondary | ICD-10-CM | POA: Diagnosis present

## 2020-03-01 DIAGNOSIS — Z9181 History of falling: Secondary | ICD-10-CM | POA: Diagnosis present

## 2020-03-01 DIAGNOSIS — R2681 Unsteadiness on feet: Secondary | ICD-10-CM | POA: Insufficient documentation

## 2020-03-01 NOTE — Therapy (Signed)
[place] [facility] [address] [place], [place], [address] Phone: [phone]   Fax:  [phone]  Physical Therapy Treatment/Progress Note  Patient Details  Name: Rebecca Wolf MRN: [id number] Date of Birth: 10-Jan-1925 Referring Provider (PT): Kimel-Scott, Santiago Glad MD   Encounter Date: 03/01/2020   PT End of Session - 03/01/20 0913    Visit Number 14    Number of Visits 21    Date for PT Re-Evaluation 02/16/20    Authorization Type 4 / 10 Medicare    PT Start Time 0905    PT Stop Time 0945    PT Time Calculation (min) 40 min    Equipment Utilized During Treatment Gait belt    Activity Tolerance Patient tolerated treatment well;Patient limited by fatigue    Behavior During Therapy [facility] for tasks assessed/performed           Past Medical History:  Diagnosis Date   Asthma    Breast cancer ([place]) 2003   LT LUMPECTOMY   Breast cancer ([place]) 2004   LT LUMPECTOMY   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   GERD (gastroesophageal reflux disease)    Hemorrhoids    Hypertension    Personal history of chemotherapy 2004   BREAST CA   Personal history of radiation therapy 2003   BREAST CA   Personal history of radiation therapy 2004   BREAST CA   Polyp of colon 2002   bleeding polyp of right ascending colon   PUD (peptic ulcer disease)    Seizures ([place])    epilepsy well controlled    Past Surgical History:  Procedure Laterality Date   BREAST EXCISIONAL BIOPSY Left 2003   positive   BREAST EXCISIONAL BIOPSY Left 2004   positive   BREAST LUMPECTOMY Left 2003   BREAST CA   BREAST LUMPECTOMY Left 2004   BREAST CA   HEMICOLECTOMY Right    bowel obstruction   HEMORRHOID SURGERY     THORACOTOMY/LOBECTOMY  1999   carcinoid tumor   TOTAL VAGINAL HYSTERECTOMY     VARICOSE VEIN SURGERY      There were no vitals filed for this visit.   Subjective Assessment - 03/01/20 0912     Subjective Patient reports she has been doing well and has no other issues.    Patient is accompained by: Family member    Pertinent History Anal Prolapse, Spinal stenosis, HTN,    Limitations Lifting;Sitting;Standing;Walking;House hold activities    How long can you stand comfortably? <22min    How long can you walk comfortably? 2 min with Rollator    Patient Stated Goals To move easier    Currently in Pain? No/denies    Pain Onset More than a month ago          Interventions   Therapeutic Exercise -Nustepin sitting level 3 with patient positioned -- x 10 min with focus on speed -Standing with UE Support x1:40 min -5xSTS -TUG Test 18 sec  -2MWT 200 ft   Performed Reassessment                          PT Education - 03/01/20 0913    Education Details form/technique with exercise    Person(s) Educated Patient    Methods Explanation;Demonstration    Comprehension Verbalized understanding;Returned demonstration               PT Long Term Goals - 03/01/20  0919      PT LONG TERM GOAL #1   Title Patient will be independent with HEP to continue benefits of therapy after discharge.    Baseline Dependent with exercise progression; 10/14/2019: not currently performing exercises; 12/22/2019: States she'll begin walking with the change of the weather    Time 6    Period Weeks    Status On-going      PT LONG TERM GOAL #2   Title Patient will improve TUG to under 12 seconds to indicate signficant improvement in fall risk.     Baseline TUG: 16 seconds; 10/14/2019: 26 sec; 12/22/2019: 21.3 sec; 03/01/20: 18 sec    Time 6    Period Weeks    Status On-going      PT LONG TERM GOAL #3   Title Patient will improve 68minWT to over 359ft to indicate functional improvement in walking tolerance and greater ability to walk in stores before requiring a sitting rest break    Baseline 172ft stopped test at 55sec secondary to fatigue; 10/14/2019: 146ft in 80 sec; 12/22/2019:  232ft 106sec; 03/01/20: 261ft 120 sec    Time 6    Period Weeks    Status On-going      PT LONG TERM GOAL #4   Title Patient will demonstrate ability to stand for over 2 min to allow for improved ability to perform household chores at home.     Baseline able to stand for 21 sec; 10/14/2019: 30sec; 12/22/2019: 30 sec; 03/01/20: 100 sec    Time 6    Period Weeks    Status On-going      PT LONG TERM GOAL #5   Title Pt will decrease 5TSTS to <12 seconds from a chair at 19in in height in order to demonstrate clinically significant improvement in LE strength    Baseline 5xSTS: 16 sec from 21" height chair with UE support; 10/14/2019: 14.5 sec 22"; 12/22/2019: with B UE support, 24.7 w/o UE support and PT assistance; 03/01/20: 18sec from roll- aider (21in)    Time 6    Period Weeks    Status On-going                 Plan - 03/01/20 0943    Clinical Impression Statement Patient is progressing towards long-term goals which was demonstrated by improving her TUG and 5xSTS scores showing a decrease in falls risk.  Patient also increased her standing time significantly when compared to last time and was able to complete 2MWT without stopping before the 2 minutes indicating an increase in her endurance even though her distance remained the same.  Patient is making improvements and will continue to benefit from skilled therapy to improve limitations remaining and return to prior level of function.    Personal Factors and Comorbidities Comorbidity 3+;Fitness    Comorbidities Chronic LBP, spinal stenosis, anal prolapse, HTN    Examination-Activity Limitations Bathing;Lift;Carry;Stand;Stairs;Squat;Sit;Transfers    Examination-Participation Restrictions Cleaning;Community Activity    Stability/Clinical Decision Making Evolving/Moderate complexity    Clinical Decision Making Moderate    Rehab Potential Fair    PT Frequency 2x / week    PT Duration 6 weeks    PT Treatment/Interventions Electrical  Stimulation;Cryotherapy;Moist Heat;Ultrasound;Gait training;Stair training;Functional mobility training;Neuromuscular re-education;Balance training;Therapeutic exercise;Therapeutic activities;Patient/family education;Manual techniques;Passive range of motion;Energy conservation;Wheelchair mobility training    PT Next Visit Plan endurance and strengthening exercise    PT Home Exercise Plan See education section    Consulted and Agree with Plan of Care Patient;Family member/caregiver  Family Member Consulted Daughter           Patient will benefit from skilled therapeutic intervention in order to improve the following deficits and impairments:  Abnormal gait, Pain, Improper body mechanics, Decreased mobility, Decreased coordination, Increased muscle spasms, Postural dysfunction, Hypermobility, Decreased activity tolerance, Decreased endurance, Decreased range of motion, Decreased strength, Hypomobility, Decreased balance, Decreased safety awareness, Difficulty walking  Visit Diagnosis: Difficulty in walking, not elsewhere classified  Muscle weakness (generalized)  History of falling  Unsteadiness on feet  Other lack of coordination     Problem List Patient Active Problem List   Diagnosis Date Noted   B12 deficiency 10/17/2019   Folate deficiency 10/17/2019   Anemia 08/29/2019   Iron deficiency anemia 08/26/2019   Chronic anticoagulation 10/25/2018   CKD stage G3b/A1, GFR 30-44 and albumin creatinine ratio <30 mg/g 10/05/2018   MGUS (monoclonal gammopathy of unknown significance) 07/23/2018   Osteopenia after menopause 07/16/2018   Diabetes mellitus with no complication ([facility]) 45/85/9292   Vitamin D deficiency 04/07/2018   Heart palpitations 01/06/2018   Tachycardia 01/06/2018   Cystocele with rectocele 10/30/2017   Rectocele 10/30/2017   Microcytic red blood cells 09/23/2017   Localized edema 02/13/2017   Hemorrhoids 05/17/2016   Obesity (BMI 30-39.9)  04/26/2016   Multiple lung nodules 04/19/2016   Chest pain 04/17/2016   Partial symptomatic epilepsy with complex partial seizures, not intractable, without status epilepticus ([place]) 06/30/2015   Bronchiectasis without complication ([place]) 44/62/8638   Imbalance 11/08/2014   Essential (primary) hypertension 11/08/2014   Personal history of malignant neoplasm of breast 11/08/2014   History of malignant carcinoid tumor of bronchus and lung 11/08/2014   H/O peptic ulcer 11/08/2014   Asthma, mild intermittent 11/08/2014   Pulmonary embolism ([place]) 11/08/2014   Gonalgia 11/08/2014   Leg varices 11/08/2014   9:47 AM, 03/01/20 Margarito Liner, SPT Student Physical Therapist [place]  Margarito Liner 03/01/2020, 9:45 AM  [place] [facility] [address] [place], [address] Phone: [phone]   Fax:  [phone]  Name: YANIRA TOLSMA MRN: [id number] Date of Birth: October 09, 1924

## 2020-03-07 ENCOUNTER — Inpatient Hospital Stay: Payer: Medicare Other | Attending: Hematology and Oncology

## 2020-03-07 ENCOUNTER — Inpatient Hospital Stay: Payer: Medicare Other

## 2020-03-07 DIAGNOSIS — E538 Deficiency of other specified B group vitamins: Secondary | ICD-10-CM | POA: Insufficient documentation

## 2020-03-08 ENCOUNTER — Other Ambulatory Visit: Payer: Self-pay

## 2020-03-08 ENCOUNTER — Ambulatory Visit: Payer: Medicare Other

## 2020-03-08 DIAGNOSIS — Z9181 History of falling: Secondary | ICD-10-CM

## 2020-03-08 DIAGNOSIS — R262 Difficulty in walking, not elsewhere classified: Secondary | ICD-10-CM | POA: Diagnosis not present

## 2020-03-08 DIAGNOSIS — M6281 Muscle weakness (generalized): Secondary | ICD-10-CM

## 2020-03-08 NOTE — Therapy (Signed)
[facility] [address] [place], [address] Phone: [phone]   Fax:  [phone]  Physical Therapy Treatment  Patient Details  Name: Rebecca Wolf MRN: [id number] Date of Birth: 1924-08-10 Referring Provider (PT): Erline Levine MD   Encounter Date: 03/08/2020   PT End of Session - 03/08/20 0933    Visit Number 15    Number of Visits 21    Date for PT Re-Evaluation 02/16/20    Authorization Type 5 / 10 Medicare    PT Start Time 0905    PT Stop Time 0930    PT Time Calculation (min) 25 min    Equipment Utilized During Treatment Gait belt    Activity Tolerance Patient tolerated treatment well;Patient limited by fatigue    Behavior During Therapy [facility] for tasks assessed/performed           Past Medical History:  Diagnosis Date  . Asthma   . Breast cancer ([place]) 2003   LT LUMPECTOMY  . Breast cancer ([place]) 2004   LT LUMPECTOMY  . 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  . GERD (gastroesophageal reflux disease)   . Hemorrhoids   . Hypertension   . Personal history of chemotherapy 2004   BREAST CA  . Personal history of radiation therapy 2003   BREAST CA  . Personal history of radiation therapy 2004   BREAST CA  . Polyp of colon 2002   bleeding polyp of right ascending colon  . PUD (peptic ulcer disease)   . Seizures ([place])    epilepsy well controlled    Past Surgical History:  Procedure Laterality Date  . BREAST EXCISIONAL BIOPSY Left 2003   positive  . BREAST EXCISIONAL BIOPSY Left 2004   positive  . BREAST LUMPECTOMY Left 2003   BREAST CA  . BREAST LUMPECTOMY Left 2004   BREAST CA  . HEMICOLECTOMY Right    bowel obstruction  . HEMORRHOID SURGERY    . THORACOTOMY/LOBECTOMY  1999   carcinoid tumor  . TOTAL VAGINAL HYSTERECTOMY    . VARICOSE VEIN SURGERY      There were no vitals filed for this visit.   Subjective Assessment - 03/08/20 0922    Subjective Patient  states increased elbow pain most notably with standing and weight bearing onto her UEs.    Patient is accompained by: Family member    Pertinent History Anal Prolapse, Spinal stenosis, HTN,    Limitations Lifting;Sitting;Standing;Walking;House hold activities    How long can you stand comfortably? <21min    How long can you walk comfortably? 2 min with Rollator    Patient Stated Goals To move easier    Currently in Pain? Yes    Pain Score 8     Pain Location Elbow    Pain Orientation Right;Left    Pain Descriptors / Indicators Sore    Pain Type Chronic pain    Pain Onset More than a month ago    Pain Frequency Intermittent           TREATMENT Therapeutic Exercise Therapeutic Exercise -Nustepin sitting level 3 with patient positioned -- x 10 min with focus on speed -Standing with UE Support hip extension in standing -- x 10  -Elbow extension in sitting with YTB -- x 10  Performed Reassessment     PT Education - 03/08/20 0929    Education Details form/technique with exercise    Person(s) Educated Patient    Methods Explanation;Demonstration  Comprehension Verbalized understanding;Returned demonstration               PT Long Term Goals - 03/01/20 0919      PT LONG TERM GOAL #1   Title Patient will be independent with HEP to continue benefits of therapy after discharge.    Baseline Dependent with exercise progression; 10/14/2019: not currently performing exercises; 12/22/2019: States she'll begin walking with the change of the weather    Time 6    Period Weeks    Status On-going      PT LONG TERM GOAL #2   Title Patient will improve TUG to under 12 seconds to indicate signficant improvement in fall risk.     Baseline TUG: 16 seconds; 10/14/2019: 26 sec; 12/22/2019: 21.3 sec; 03/01/20: 18 sec    Time 6    Period Weeks    Status On-going      PT LONG TERM GOAL #3   Title Patient will improve 3minWT to over 362ft to indicate functional improvement in walking  tolerance and greater ability to walk in stores before requiring a sitting rest break    Baseline 158ft stopped test at 55sec secondary to fatigue; 10/14/2019: 117ft in 80 sec; 12/22/2019: 268ft 106sec; 03/01/20: 245ft 120 sec    Time 6    Period Weeks    Status On-going      PT LONG TERM GOAL #4   Title Patient will demonstrate ability to stand for over 2 min to allow for improved ability to perform household chores at home.     Baseline able to stand for 21 sec; 10/14/2019: 30sec; 12/22/2019: 30 sec; 03/01/20: 100 sec    Time 6    Period Weeks    Status On-going      PT LONG TERM GOAL #5   Title Pt will decrease 5TSTS to <12 seconds from a chair at 19in in height in order to demonstrate clinically significant improvement in LE strength    Baseline 5xSTS: 16 sec from 21" height chair with UE support; 10/14/2019: 14.5 sec 22"; 12/22/2019: with B UE support, 24.7 w/o UE support and PT assistance; 03/01/20: 18sec from roll- aider (21in)    Time 6    Period Weeks    Status On-going                 Plan - 03/08/20 1497    Clinical Impression Statement Shortened session today secondary to increased elbow pain and overall increase of fatigue during the session. Performed exercises to address hip weakness and improvement in endurance. Will continue to monitor symptoms throughout sessions. Patient will benefit from further skilled therapy to return to prior level of function.    Personal Factors and Comorbidities Comorbidity 3+;Fitness    Comorbidities Chronic LBP, spinal stenosis, anal prolapse, HTN    Examination-Activity Limitations Bathing;Lift;Carry;Stand;Stairs;Squat;Sit;Transfers    Examination-Participation Restrictions Cleaning;Community Activity    Stability/Clinical Decision Making Evolving/Moderate complexity    Rehab Potential Fair    PT Frequency 2x / week    PT Duration 6 weeks    PT Treatment/Interventions Electrical Stimulation;Cryotherapy;Moist Heat;Ultrasound;Gait  training;Stair training;Functional mobility training;Neuromuscular re-education;Balance training;Therapeutic exercise;Therapeutic activities;Patient/family education;Manual techniques;Passive range of motion;Energy conservation;Wheelchair mobility training    PT Next Visit Plan endurance and strengthening exercise    PT Home Exercise Plan See education section    Consulted and Agree with Plan of Care Patient;Family member/caregiver    Family Member Consulted Daughter           Patient will benefit from  skilled therapeutic intervention in order to improve the following deficits and impairments:  Abnormal gait, Pain, Improper body mechanics, Decreased mobility, Decreased coordination, Increased muscle spasms, Postural dysfunction, Hypermobility, Decreased activity tolerance, Decreased endurance, Decreased range of motion, Decreased strength, Hypomobility, Decreased balance, Decreased safety awareness, Difficulty walking  Visit Diagnosis: Difficulty in walking, not elsewhere classified  Muscle weakness (generalized)  History of falling     Problem List Patient Active Problem List   Diagnosis Date Noted  . B12 deficiency 10/17/2019  . Folate deficiency 10/17/2019  . Anemia 08/29/2019  . Iron deficiency anemia 08/26/2019  . Chronic anticoagulation 10/25/2018  . CKD stage G3b/A1, GFR 30-44 and albumin creatinine ratio <30 mg/g 10/05/2018  . MGUS (monoclonal gammopathy of unknown significance) 07/23/2018  . Osteopenia after menopause 07/16/2018  . Diabetes mellitus with no complication ([place]) 49/44/9675  . Vitamin D deficiency 04/07/2018  . Heart palpitations 01/06/2018  . Tachycardia 01/06/2018  . Cystocele with rectocele 10/30/2017  . Rectocele 10/30/2017  . Microcytic red blood cells 09/23/2017  . Localized edema 02/13/2017  . Hemorrhoids 05/17/2016  . Obesity (BMI 30-39.9) 04/26/2016  . Multiple lung nodules 04/19/2016  . Chest pain 04/17/2016  . Partial symptomatic epilepsy  with complex partial seizures, not intractable, without status epilepticus ([place]) 06/30/2015  . Bronchiectasis without complication ([place]) 91/63/8466  . Imbalance 11/08/2014  . Essential (primary) hypertension 11/08/2014  . Personal history of malignant neoplasm of breast 11/08/2014  . History of malignant carcinoid tumor of bronchus and lung 11/08/2014  . H/O peptic ulcer 11/08/2014  . Asthma, mild intermittent 11/08/2014  . Pulmonary embolism ([place]) 11/08/2014  . Gonalgia 11/08/2014  . Leg varices 11/08/2014    Blythe Stanford, PT DPT 03/08/2020, 9:46 AM  [facility] [address] [place], [address] Phone: [phone]   Fax:  [phone]  Name: CHARISE LEINBACH MRN: [id number] Date of Birth: 1924/10/24

## 2020-03-10 ENCOUNTER — Inpatient Hospital Stay: Payer: Medicare Other

## 2020-03-15 ENCOUNTER — Ambulatory Visit: Payer: Medicare Other

## 2020-03-22 ENCOUNTER — Ambulatory Visit: Payer: Medicare Other

## 2020-03-23 ENCOUNTER — Ambulatory Visit: Payer: Medicare Other

## 2020-03-23 ENCOUNTER — Other Ambulatory Visit: Payer: Self-pay

## 2020-03-23 DIAGNOSIS — M6281 Muscle weakness (generalized): Secondary | ICD-10-CM

## 2020-03-23 DIAGNOSIS — Z9181 History of falling: Secondary | ICD-10-CM

## 2020-03-23 DIAGNOSIS — R262 Difficulty in walking, not elsewhere classified: Secondary | ICD-10-CM | POA: Diagnosis not present

## 2020-03-23 NOTE — Therapy (Signed)
[facility] [address], [place], [address] Phone: [phone]   Fax:  [phone]  Physical Therapy Treatment  Patient Details  Name: Rebecca Wolf MRN: [id number] Date of Birth: 11/28/1924 Referring Provider (PT): Erline Levine MD   Encounter Date: 03/23/2020   PT End of Session - 03/23/20 1526    Visit Number 16    Number of Visits 21    Date for PT Re-Evaluation 05/24/20    Authorization Type 5 / 10 Medicare    PT Start Time 1030    PT Stop Time 1115    PT Time Calculation (min) 45 min    Equipment Utilized During Treatment Gait belt    Activity Tolerance Patient tolerated treatment well;Patient limited by fatigue    Behavior During Therapy [facility] for tasks assessed/performed           Past Medical History:  Diagnosis Date  . Asthma   . Breast cancer ([place]) 2003   LT LUMPECTOMY  . Breast cancer ([place]) 2004   LT LUMPECTOMY  . 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  . GERD (gastroesophageal reflux disease)   . Hemorrhoids   . Hypertension   . Personal history of chemotherapy 2004   BREAST CA  . Personal history of radiation therapy 2003   BREAST CA  . Personal history of radiation therapy 2004   BREAST CA  . Polyp of colon 2002   bleeding polyp of right ascending colon  . PUD (peptic ulcer disease)   . Seizures ([place])    epilepsy well controlled    Past Surgical History:  Procedure Laterality Date  . BREAST EXCISIONAL BIOPSY Left 2003   positive  . BREAST EXCISIONAL BIOPSY Left 2004   positive  . BREAST LUMPECTOMY Left 2003   BREAST CA  . BREAST LUMPECTOMY Left 2004   BREAST CA  . HEMICOLECTOMY Right    bowel obstruction  . HEMORRHOID SURGERY    . THORACOTOMY/LOBECTOMY  1999   carcinoid tumor  . TOTAL VAGINAL HYSTERECTOMY    . VARICOSE VEIN SURGERY      There were no vitals filed for this visit.   Subjective Assessment - 03/23/20 1048    Subjective Patient  reports she has been performing some walking around the house. States her elbows continue to feel sore. Reports she got a powerwheelchair but has not had the oppertunity to use it secondary to not having a ramp to get in the house.    Patient is accompained by: Family member    Pertinent History Anal Prolapse, Spinal stenosis, HTN,    Limitations Lifting;Sitting;Standing;Walking;House hold activities    How long can you stand comfortably? <67min    How long can you walk comfortably? 2 min with Rollator    Patient Stated Goals To move easier    Currently in Pain? Yes    Pain Score 8     Pain Location Elbow    Pain Orientation Left;Right    Pain Type Chronic pain    Pain Onset More than a month ago    Pain Frequency Intermittent           TREATMENT Therapeutic Exercise Hip extension in standing with UE support -x 20  Nustepin sitting level 3 with patient positioned -- x 10 min with focus on speed Ambulationwith use of rollator -- x 29ft Hip abduction in standing with UE support -x 20  Performed to increased strength  PT Education - 03/23/20 1524    Education Details form/technique with exercise    Person(s) Educated Patient    Methods Explanation;Demonstration    Comprehension Verbalized understanding;Returned demonstration               PT Long Term Goals - 03/01/20 0919      PT LONG TERM GOAL #1   Title Patient will be independent with HEP to continue benefits of therapy after discharge.    Baseline Dependent with exercise progression; 10/14/2019: not currently performing exercises; 12/22/2019: States she'll begin walking with the change of the weather    Time 6    Period Weeks    Status On-going      PT LONG TERM GOAL #2   Title Patient will improve TUG to under 12 seconds to indicate signficant improvement in fall risk.     Baseline TUG: 16 seconds; 10/14/2019: 26 sec; 12/22/2019: 21.3 sec; 03/01/20: 18 sec    Time 6    Period Weeks    Status On-going       PT LONG TERM GOAL #3   Title Patient will improve 62minWT to over 364ft to indicate functional improvement in walking tolerance and greater ability to walk in stores before requiring a sitting rest break    Baseline 141ft stopped test at 55sec secondary to fatigue; 10/14/2019: 186ft in 80 sec; 12/22/2019: 269ft 106sec; 03/01/20: 211ft 120 sec    Time 6    Period Weeks    Status On-going      PT LONG TERM GOAL #4   Title Patient will demonstrate ability to stand for over 2 min to allow for improved ability to perform household chores at home.     Baseline able to stand for 21 sec; 10/14/2019: 30sec; 12/22/2019: 30 sec; 03/01/20: 100 sec    Time 6    Period Weeks    Status On-going      PT LONG TERM GOAL #5   Title Pt will decrease 5TSTS to <12 seconds from a chair at 19in in height in order to demonstrate clinically significant improvement in LE strength    Baseline 5xSTS: 16 sec from 21" height chair with UE support; 10/14/2019: 14.5 sec 22"; 12/22/2019: with B UE support, 24.7 w/o UE support and PT assistance; 03/01/20: 18sec from roll- aider (21in)    Time 6    Period Weeks    Status On-going                 Plan - 03/23/20 1531    Clinical Impression Statement Increased fatigue after performing sit to stands and standing based exercises. Continued to perform standing exercises to patient tolerance. Patient will benefit from further skilled therapy to return to prior level of function.    Personal Factors and Comorbidities Comorbidity 3+;Fitness    Comorbidities Chronic LBP, spinal stenosis, anal prolapse, HTN    Examination-Activity Limitations Bathing;Lift;Carry;Stand;Stairs;Squat;Sit;Transfers    Examination-Participation Restrictions Cleaning;Community Activity    Stability/Clinical Decision Making Evolving/Moderate complexity    Rehab Potential Fair    PT Frequency 2x / week    PT Duration 6 weeks    PT Treatment/Interventions Electrical Stimulation;Cryotherapy;Moist  Heat;Ultrasound;Gait training;Stair training;Functional mobility training;Neuromuscular re-education;Balance training;Therapeutic exercise;Therapeutic activities;Patient/family education;Manual techniques;Passive range of motion;Energy conservation;Wheelchair mobility training    PT Next Visit Plan endurance and strengthening exercise    PT Home Exercise Plan See education section    Consulted and Agree with Plan of Care Patient;Family member/caregiver    Family Member Consulted Daughter  Patient will benefit from skilled therapeutic intervention in order to improve the following deficits and impairments:  Abnormal gait, Pain, Improper body mechanics, Decreased mobility, Decreased coordination, Increased muscle spasms, Postural dysfunction, Hypermobility, Decreased activity tolerance, Decreased endurance, Decreased range of motion, Decreased strength, Hypomobility, Decreased balance, Decreased safety awareness, Difficulty walking  Visit Diagnosis: Difficulty in walking, not elsewhere classified  Muscle weakness (generalized)  History of falling     Problem List Patient Active Problem List   Diagnosis Date Noted  . B12 deficiency 10/17/2019  . Folate deficiency 10/17/2019  . Anemia 08/29/2019  . Iron deficiency anemia 08/26/2019  . Chronic anticoagulation 10/25/2018  . CKD stage G3b/A1, GFR 30-44 and albumin creatinine ratio <30 mg/g 10/05/2018  . MGUS (monoclonal gammopathy of unknown significance) 07/23/2018  . Osteopenia after menopause 07/16/2018  . Diabetes mellitus with no complication ([place]) 03/55/9741  . Vitamin D deficiency 04/07/2018  . Heart palpitations 01/06/2018  . Tachycardia 01/06/2018  . Cystocele with rectocele 10/30/2017  . Rectocele 10/30/2017  . Microcytic red blood cells 09/23/2017  . Localized edema 02/13/2017  . Hemorrhoids 05/17/2016  . Obesity (BMI 30-39.9) 04/26/2016  . Multiple lung nodules 04/19/2016  . Chest pain 04/17/2016  . Partial  symptomatic epilepsy with complex partial seizures, not intractable, without status epilepticus ([place]) 06/30/2015  . Bronchiectasis without complication ([place]) 63/84/5364  . Imbalance 11/08/2014  . Essential (primary) hypertension 11/08/2014  . Personal history of malignant neoplasm of breast 11/08/2014  . History of malignant carcinoid tumor of bronchus and lung 11/08/2014  . H/O peptic ulcer 11/08/2014  . Asthma, mild intermittent 11/08/2014  . Pulmonary embolism ([place]) 11/08/2014  . Gonalgia 11/08/2014  . Leg varices 11/08/2014    Blythe Stanford, PT DPT 03/23/2020, 3:39 PM  [facility] [address], [place], [address] Phone: [phone]   Fax:  [phone]  Name: Rebecca Wolf MRN: [id number] Date of Birth: [birth year]

## 2020-03-23 NOTE — Addendum Note (Signed)
Addended by: Blain Pais on: 03/23/2020 03:26 PM   Modules accepted: Orders

## 2020-03-24 ENCOUNTER — Ambulatory Visit: Payer: Medicare Other | Admitting: Podiatry

## 2020-03-24 ENCOUNTER — Inpatient Hospital Stay: Payer: Medicare Other

## 2020-03-24 DIAGNOSIS — D509 Iron deficiency anemia, unspecified: Secondary | ICD-10-CM

## 2020-03-24 DIAGNOSIS — E538 Deficiency of other specified B group vitamins: Secondary | ICD-10-CM | POA: Diagnosis present

## 2020-03-24 MED ORDER — CYANOCOBALAMIN 1000 MCG/ML IJ SOLN
1000.0000 ug | Freq: Once | INTRAMUSCULAR | Status: AC
  Administered 2020-03-24: 1000 ug via INTRAMUSCULAR
  Filled 2020-03-24: qty 1

## 2020-03-29 ENCOUNTER — Other Ambulatory Visit: Payer: Self-pay

## 2020-03-29 ENCOUNTER — Ambulatory Visit: Payer: Medicare Other

## 2020-03-29 DIAGNOSIS — R262 Difficulty in walking, not elsewhere classified: Secondary | ICD-10-CM | POA: Diagnosis not present

## 2020-03-29 DIAGNOSIS — M6281 Muscle weakness (generalized): Secondary | ICD-10-CM

## 2020-03-29 NOTE — Therapy (Signed)
[facility] [address], [place], [address] Phone: [phone]   Fax:  [phone]  Physical Therapy Treatment  Patient Details  Name: Rebecca Wolf MRN: [id number] Date of Birth: 1925/02/20 Referring Provider (PT): Erline Levine MD   Encounter Date: 03/29/2020   PT End of Session - 03/29/20 1353    Visit Number 17    Number of Visits 21    Date for PT Re-Evaluation 05/24/20    Authorization Type 7 / 10 Medicare    PT Start Time 7209    PT Stop Time 1430    PT Time Calculation (min) 45 min    Equipment Utilized During Treatment Gait belt    Activity Tolerance Patient tolerated treatment well;Patient limited by fatigue    Behavior During Therapy [facility] for tasks assessed/performed           Past Medical History:  Diagnosis Date  . Asthma   . Breast cancer ([place]) 2003   LT LUMPECTOMY  . Breast cancer ([place]) 2004   LT LUMPECTOMY  . 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  . GERD (gastroesophageal reflux disease)   . Hemorrhoids   . Hypertension   . Personal history of chemotherapy 2004   BREAST CA  . Personal history of radiation therapy 2003   BREAST CA  . Personal history of radiation therapy 2004   BREAST CA  . Polyp of colon 2002   bleeding polyp of right ascending colon  . PUD (peptic ulcer disease)   . Seizures ([place])    epilepsy well controlled    Past Surgical History:  Procedure Laterality Date  . BREAST EXCISIONAL BIOPSY Left 2003   positive  . BREAST EXCISIONAL BIOPSY Left 2004   positive  . BREAST LUMPECTOMY Left 2003   BREAST CA  . BREAST LUMPECTOMY Left 2004   BREAST CA  . HEMICOLECTOMY Right    bowel obstruction  . HEMORRHOID SURGERY    . THORACOTOMY/LOBECTOMY  1999   carcinoid tumor  . TOTAL VAGINAL HYSTERECTOMY    . VARICOSE VEIN SURGERY      There were no vitals filed for this visit.   Subjective Assessment - 03/29/20 1350    Subjective Patient  states no major changes since the previous session. Patient reports her elbows conitnue to bother her from use of her UE.    Patient is accompained by: Family member    Pertinent History Anal Prolapse, Spinal stenosis, HTN,    Limitations Lifting;Sitting;Standing;Walking;House hold activities    How long can you stand comfortably? <16min    How long can you walk comfortably? 2 min with Rollator    Patient Stated Goals To move easier    Currently in Pain? Yes    Pain Score 6     Pain Location Elbow    Pain Orientation Right;Left    Pain Descriptors / Indicators Sore    Pain Type Chronic pain    Pain Onset More than a month ago    Pain Frequency Intermittent                TREATMENT Therapeutic Exercise  Hip abduction in standing with UE support - x 20  Sit to stands from nustep chair - 2 x 5 Nustep in sitting level 3 with patient positioned -- x 10 min with focus on speed Ambulation with use of rollator -- x 238ft Side stepping two steps each direction - x 10 Heel raises  in standing - x 10    Performed to increased strength     PT Education - 03/29/20 1353    Education Details form/technique with exercise    Person(s) Educated Patient    Methods Explanation;Demonstration    Comprehension Verbalized understanding;Returned demonstration               PT Long Term Goals - 03/01/20 0919      PT LONG TERM GOAL #1   Title Patient will be independent with HEP to continue benefits of therapy after discharge.    Baseline Dependent with exercise progression; 10/14/2019: not currently performing exercises; 12/22/2019: States she'll begin walking with the change of the weather    Time 6    Period Weeks    Status On-going      PT LONG TERM GOAL #2   Title Patient will improve TUG to under 12 seconds to indicate signficant improvement in fall risk.     Baseline TUG: 16 seconds; 10/14/2019: 26 sec; 12/22/2019: 21.3 sec; 03/01/20: 18 sec    Time 6    Period Weeks    Status  On-going      PT LONG TERM GOAL #3   Title Patient will improve 48minWT to over 354ft to indicate functional improvement in walking tolerance and greater ability to walk in stores before requiring a sitting rest break    Baseline 177ft stopped test at 55sec secondary to fatigue; 10/14/2019: 129ft in 80 sec; 12/22/2019: 255ft 106sec; 03/01/20: 243ft 120 sec    Time 6    Period Weeks    Status On-going      PT LONG TERM GOAL #4   Title Patient will demonstrate ability to stand for over 2 min to allow for improved ability to perform household chores at home.     Baseline able to stand for 21 sec; 10/14/2019: 30sec; 12/22/2019: 30 sec; 03/01/20: 100 sec    Time 6    Period Weeks    Status On-going      PT LONG TERM GOAL #5   Title Pt will decrease 5TSTS to <12 seconds from a chair at 19in in height in order to demonstrate clinically significant improvement in LE strength    Baseline 5xSTS: 16 sec from 21" height chair with UE support; 10/14/2019: 14.5 sec 22"; 12/22/2019: with B UE support, 24.7 w/o UE support and PT assistance; 03/01/20: 18sec from roll- aider (21in)    Time 6    Period Weeks    Status On-going                 Plan - 03/29/20 1410    Clinical Impression Statement Patient with increased weakness with performance of sit to stands with use of UEs compared to previous session. Increased fatigue overall today compared to previous sessions limiting performance and ending session early. Patient will benefit from further skilled therapy to return to prior level of function.    Personal Factors and Comorbidities Comorbidity 3+;Fitness    Comorbidities Chronic LBP, spinal stenosis, anal prolapse, HTN    Examination-Activity Limitations Bathing;Lift;Carry;Stand;Stairs;Squat;Sit;Transfers    Examination-Participation Restrictions Cleaning;Community Activity    Stability/Clinical Decision Making Evolving/Moderate complexity    Rehab Potential Fair    PT Frequency 2x / week    PT  Duration 6 weeks    PT Treatment/Interventions Electrical Stimulation;Cryotherapy;Moist Heat;Ultrasound;Gait training;Stair training;Functional mobility training;Neuromuscular re-education;Balance training;Therapeutic exercise;Therapeutic activities;Patient/family education;Manual techniques;Passive range of motion;Energy conservation;Wheelchair mobility training    PT Next Visit Plan endurance and strengthening exercise  PT Home Exercise Plan See education section    Consulted and Agree with Plan of Care Patient;Family member/caregiver    Family Member Consulted Daughter           Patient will benefit from skilled therapeutic intervention in order to improve the following deficits and impairments:  Abnormal gait, Pain, Improper body mechanics, Decreased mobility, Decreased coordination, Increased muscle spasms, Postural dysfunction, Hypermobility, Decreased activity tolerance, Decreased endurance, Decreased range of motion, Decreased strength, Hypomobility, Decreased balance, Decreased safety awareness, Difficulty walking  Visit Diagnosis: Difficulty in walking, not elsewhere classified  Muscle weakness (generalized)     Problem List Patient Active Problem List   Diagnosis Date Noted  . B12 deficiency 10/17/2019  . Folate deficiency 10/17/2019  . Anemia 08/29/2019  . Iron deficiency anemia 08/26/2019  . Chronic anticoagulation 10/25/2018  . CKD stage G3b/A1, GFR 30-44 and albumin creatinine ratio <30 mg/g 10/05/2018  . MGUS (monoclonal gammopathy of unknown significance) 07/23/2018  . Osteopenia after menopause 07/16/2018  . Diabetes mellitus with no complication ([place]) 16/04/9603  . Vitamin D deficiency 04/07/2018  . Heart palpitations 01/06/2018  . Tachycardia 01/06/2018  . Cystocele with rectocele 10/30/2017  . Rectocele 10/30/2017  . Microcytic red blood cells 09/23/2017  . Localized edema 02/13/2017  . Hemorrhoids 05/17/2016  . Obesity (BMI 30-39.9) 04/26/2016  .  Multiple lung nodules 04/19/2016  . Chest pain 04/17/2016  . Partial symptomatic epilepsy with complex partial seizures, not intractable, without status epilepticus ([place]) 06/30/2015  . Bronchiectasis without complication ([place]) 54/03/8118  . Imbalance 11/08/2014  . Essential (primary) hypertension 11/08/2014  . Personal history of malignant neoplasm of breast 11/08/2014  . History of malignant carcinoid tumor of bronchus and lung 11/08/2014  . H/O peptic ulcer 11/08/2014  . Asthma, mild intermittent 11/08/2014  . Pulmonary embolism ([place]) 11/08/2014  . Gonalgia 11/08/2014  . Leg varices 11/08/2014    Blythe Stanford, PT DPT 03/29/2020, 2:21 PM  [facility] [address], [place], [address] Phone: [phone]   Fax:  [phone]  Name: SALLIE STARON MRN: [id number] Date of Birth: Sep 16, 1924

## 2020-03-31 ENCOUNTER — Encounter: Payer: Self-pay | Admitting: *Deleted

## 2020-03-31 ENCOUNTER — Ambulatory Visit (INDEPENDENT_AMBULATORY_CARE_PROVIDER_SITE_OTHER): Payer: Medicare Other | Admitting: Podiatry

## 2020-03-31 ENCOUNTER — Other Ambulatory Visit: Payer: Self-pay

## 2020-03-31 ENCOUNTER — Encounter: Payer: Self-pay | Admitting: Podiatry

## 2020-03-31 DIAGNOSIS — R600 Localized edema: Secondary | ICD-10-CM

## 2020-03-31 DIAGNOSIS — B351 Tinea unguium: Secondary | ICD-10-CM

## 2020-03-31 DIAGNOSIS — L989 Disorder of the skin and subcutaneous tissue, unspecified: Secondary | ICD-10-CM

## 2020-03-31 DIAGNOSIS — M79676 Pain in unspecified toe(s): Secondary | ICD-10-CM | POA: Diagnosis not present

## 2020-03-31 NOTE — Progress Notes (Signed)
    Subjective: Patient is a 86 y.o. female presenting to the office today with a chief complaint of painful callus lesion(s) noted to the bilateral feet that have been present for the past few months. Applying pressure to the lesions increases the pain. She has not done anything at home for treatment.  Patient also complains of elongated, thickened nails that cause pain while ambulating in shoes. She is unable to trim her own nails. Patient presents today for further treatment and evaluation.  Past Medical History:  Diagnosis Date  . Asthma   . Breast cancer ([place]) 2003   LT LUMPECTOMY  . Breast cancer ([place]) 2004   LT LUMPECTOMY  . 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  . GERD (gastroesophageal reflux disease)   . Hemorrhoids   . Hypertension   . Personal history of chemotherapy 2004   BREAST CA  . Personal history of radiation therapy 2003   BREAST CA  . Personal history of radiation therapy 2004   BREAST CA  . Polyp of colon 2002   bleeding polyp of right ascending colon  . PUD (peptic ulcer disease)   . Seizures ([place])    epilepsy well controlled    Objective:  Physical Exam General: Alert and oriented x3 in no acute distress  Dermatology: Hyperkeratotic lesion(s) present on the bilateral feet. Pain on palpation with a central nucleated core noted. Skin is warm, dry and supple bilateral lower extremities. Negative for open lesions or macerations. Nails are tender, long, thickened and dystrophic with subungual debris, consistent with onychomycosis, 1-5 bilateral. No signs of infection noted.  Vascular: Palpable pedal pulses bilaterally. No edema or erythema noted. Capillary refill within normal limits.  Neurological: Epicritic and protective threshold grossly intact bilaterally.   Musculoskeletal Exam: Pain on palpation at the keratotic lesion(s) noted. Range of motion within normal limits bilateral. Muscle strength 5/5 in all groups bilateral.  Assessment: 1.  Onychodystrophic nails 1-5 bilateral with hyperkeratosis of nails.  2. Onychomycosis of nail due to dermatophyte bilateral 3. Pre-ulcerative callus lesions noted to the bilateral feet x 2   Plan of Care:  1. Patient evaluated. 2. Excisional debridement of keratoic lesion(s) using a chisel blade was performed without incident.  3. Dressed with light dressing. 4. Mechanical debridement of nails 1-5 bilaterally performed using a nail nipper. Filed with dremel without incident.  5.  Recommend daily compression socks.  Patient has the compression socks but does not wear them 6.  Patient is to return to the clinic in 3 months.   Edrick Kins, DPM [facility]  Dr. Edrick Kins, [address]                                        [place], [place] [address]                Office [phone]  Fax [phone]

## 2020-04-03 ENCOUNTER — Inpatient Hospital Stay: Payer: Medicare Other

## 2020-04-03 ENCOUNTER — Other Ambulatory Visit: Payer: Medicare Other

## 2020-04-04 ENCOUNTER — Ambulatory Visit: Payer: Medicare Other

## 2020-04-04 ENCOUNTER — Ambulatory Visit: Payer: Medicare Other | Attending: Internal Medicine

## 2020-04-04 ENCOUNTER — Other Ambulatory Visit: Payer: Self-pay

## 2020-04-04 ENCOUNTER — Inpatient Hospital Stay: Payer: Medicare Other | Admitting: Hematology and Oncology

## 2020-04-04 DIAGNOSIS — R262 Difficulty in walking, not elsewhere classified: Secondary | ICD-10-CM | POA: Diagnosis not present

## 2020-04-04 DIAGNOSIS — M6281 Muscle weakness (generalized): Secondary | ICD-10-CM | POA: Insufficient documentation

## 2020-04-04 DIAGNOSIS — Z9181 History of falling: Secondary | ICD-10-CM | POA: Diagnosis present

## 2020-04-04 NOTE — Therapy (Signed)
[facility] [address], [place], [address] Phone: [phone]   Fax:  [phone]  Physical Therapy Treatment  Patient Details  Name: Rebecca Wolf MRN: [id number] Date of Birth: 04/28/1925 Referring Provider (PT): Erline Levine MD   Encounter Date: 04/04/2020   PT End of Session - 04/04/20 1354    Visit Number 18    Number of Visits 21    Date for PT Re-Evaluation 05/24/20    Authorization Type 3 / 10 Medicare    PT Start Time 7341    PT Stop Time 1430    PT Time Calculation (min) 45 min    Equipment Utilized During Treatment Gait belt    Activity Tolerance Patient tolerated treatment well;Patient limited by fatigue    Behavior During Therapy [facility] for tasks assessed/performed           Past Medical History:  Diagnosis Date  . Asthma   . Breast cancer ([place]) 2003   LT LUMPECTOMY  . Breast cancer ([place]) 2004   LT LUMPECTOMY  . 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  . GERD (gastroesophageal reflux disease)   . Hemorrhoids   . Hypertension   . Personal history of chemotherapy 2004   BREAST CA  . Personal history of radiation therapy 2003   BREAST CA  . Personal history of radiation therapy 2004   BREAST CA  . Polyp of colon 2002   bleeding polyp of right ascending colon  . PUD (peptic ulcer disease)   . Seizures ([place])    epilepsy well controlled    Past Surgical History:  Procedure Laterality Date  . BREAST EXCISIONAL BIOPSY Left 2003   positive  . BREAST EXCISIONAL BIOPSY Left 2004   positive  . BREAST LUMPECTOMY Left 2003   BREAST CA  . BREAST LUMPECTOMY Left 2004   BREAST CA  . HEMICOLECTOMY Right    bowel obstruction  . HEMORRHOID SURGERY    . THORACOTOMY/LOBECTOMY  1999   carcinoid tumor  . TOTAL VAGINAL HYSTERECTOMY    . VARICOSE VEIN SURGERY      There were no vitals filed for this visit.   Subjective Assessment - 04/04/20 1347    Subjective Patient  reports she has"been weathering the financial storm". States she saw her PCP about her elbow pain. Patient states she feels it's from artritic.    Patient is accompained by: Family member    Pertinent History Anal Prolapse, Spinal stenosis, HTN,    Limitations Lifting;Sitting;Standing;Walking;House hold activities    How long can you stand comfortably? <6min    How long can you walk comfortably? 2 min with Rollator    Patient Stated Goals To move easier    Currently in Pain? No/denies    Pain Onset More than a month ago                 TREATMENT Therapeutic Exercise   Hip abduction in standing with UE support - x 10 Standing squats in standing with UE support Nustep in sitting level 3 with patient positioned -- x 10 min with focus on speed Ambulation with use of rollator -- x 2101ft Hip extension in standing - x 20  Heel raises in standing - x 20  Side stepping two steps each direction - x 10  Performed to improve strength        PT Education - 04/04/20 1354    Education Details form/technique with exercise  Person(s) Educated Patient    Methods Explanation;Demonstration    Comprehension Verbalized understanding;Returned demonstration               PT Long Term Goals - 03/01/20 0919      PT LONG TERM GOAL #1   Title Patient will be independent with HEP to continue benefits of therapy after discharge.    Baseline Dependent with exercise progression; 10/14/2019: not currently performing exercises; 12/22/2019: States she'll begin walking with the change of the weather    Time 6    Period Weeks    Status On-going      PT LONG TERM GOAL #2   Title Patient will improve TUG to under 12 seconds to indicate signficant improvement in fall risk.     Baseline TUG: 16 seconds; 10/14/2019: 26 sec; 12/22/2019: 21.3 sec; 03/01/20: 18 sec    Time 6    Period Weeks    Status On-going      PT LONG TERM GOAL #3   Title Patient will improve 64minWT to over 346ft to indicate  functional improvement in walking tolerance and greater ability to walk in stores before requiring a sitting rest break    Baseline 15ft stopped test at 55sec secondary to fatigue; 10/14/2019: 164ft in 80 sec; 12/22/2019: 278ft 106sec; 03/01/20: 269ft 120 sec    Time 6    Period Weeks    Status On-going      PT LONG TERM GOAL #4   Title Patient will demonstrate ability to stand for over 2 min to allow for improved ability to perform household chores at home.     Baseline able to stand for 21 sec; 10/14/2019: 30sec; 12/22/2019: 30 sec; 03/01/20: 100 sec    Time 6    Period Weeks    Status On-going      PT LONG TERM GOAL #5   Title Pt will decrease 5TSTS to <12 seconds from a chair at 19in in height in order to demonstrate clinically significant improvement in LE strength    Baseline 5xSTS: 16 sec from 21" height chair with UE support; 10/14/2019: 14.5 sec 22"; 12/22/2019: with B UE support, 24.7 w/o UE support and PT assistance; 03/01/20: 18sec from roll- aider (21in)    Time 6    Period Weeks    Status On-going                 Plan - 04/04/20 1402    Clinical Impression Statement Continued to focus on performing exercises in standing, walking, and performing functional exercises. Patient continues to fatigue quickly requiring frequent sitting rest breaks throughout the session. Although this limited, patient is able to perform greater amount of exercises compared to previous session. Patient will benefit from further skilled therapy to return to prior level of function.    Personal Factors and Comorbidities Comorbidity 3+;Fitness    Comorbidities Chronic LBP, spinal stenosis, anal prolapse, HTN    Examination-Activity Limitations Bathing;Lift;Carry;Stand;Stairs;Squat;Sit;Transfers    Examination-Participation Restrictions Cleaning;Community Activity    Stability/Clinical Decision Making Evolving/Moderate complexity    Rehab Potential Fair    PT Frequency 2x / week    PT Duration 6 weeks     PT Treatment/Interventions Electrical Stimulation;Cryotherapy;Moist Heat;Ultrasound;Gait training;Stair training;Functional mobility training;Neuromuscular re-education;Balance training;Therapeutic exercise;Therapeutic activities;Patient/family education;Manual techniques;Passive range of motion;Energy conservation;Wheelchair mobility training    PT Next Visit Plan endurance and strengthening exercise    PT Home Exercise Plan See education section    Consulted and Agree with Plan of Care Patient;Family member/caregiver  Family Member Consulted Daughter           Patient will benefit from skilled therapeutic intervention in order to improve the following deficits and impairments:  Abnormal gait, Pain, Improper body mechanics, Decreased mobility, Decreased coordination, Increased muscle spasms, Postural dysfunction, Hypermobility, Decreased activity tolerance, Decreased endurance, Decreased range of motion, Decreased strength, Hypomobility, Decreased balance, Decreased safety awareness, Difficulty walking  Visit Diagnosis: Difficulty in walking, not elsewhere classified  Muscle weakness (generalized)  History of falling     Problem List Patient Active Problem List   Diagnosis Date Noted  . B12 deficiency 10/17/2019  . Folate deficiency 10/17/2019  . Anemia 08/29/2019  . Iron deficiency anemia 08/26/2019  . Chronic anticoagulation 10/25/2018  . CKD stage G3b/A1, GFR 30-44 and albumin creatinine ratio <30 mg/g ([place]) 10/05/2018  . MGUS (monoclonal gammopathy of unknown significance) 07/23/2018  . Osteopenia after menopause 07/16/2018  . Diabetes mellitus with no complication ([place]) 90/30/0923  . Vitamin D deficiency 04/07/2018  . Heart palpitations 01/06/2018  . Tachycardia 01/06/2018  . Cystocele with rectocele 10/30/2017  . Rectocele 10/30/2017  . Microcytic red blood cells 09/23/2017  . Localized edema 02/13/2017  . Hemorrhoids 05/17/2016  . Obesity (BMI 30-39.9)  04/26/2016  . Multiple lung nodules 04/19/2016  . Chest pain 04/17/2016  . Partial symptomatic epilepsy with complex partial seizures, not intractable, without status epilepticus ([place]) 06/30/2015  . Bronchiectasis without complication ([place]) 30/12/6224  . Imbalance 11/08/2014  . Essential (primary) hypertension 11/08/2014  . Personal history of malignant neoplasm of breast 11/08/2014  . History of malignant carcinoid tumor of bronchus and lung 11/08/2014  . H/O peptic ulcer 11/08/2014  . Asthma, mild intermittent 11/08/2014  . Pulmonary embolism ([place]) 11/08/2014  . Gonalgia 11/08/2014  . Leg varices 11/08/2014    Blythe Stanford 04/04/2020, 2:09 PM  [facility] [address] [place], [address] Phone: [phone]   Fax:  [phone]  Name: CASSANDRA HARBOLD MRN: [id number] Date of Birth: 1925/06/01

## 2020-04-10 ENCOUNTER — Inpatient Hospital Stay: Payer: Medicare Other | Attending: Hematology and Oncology

## 2020-04-10 ENCOUNTER — Other Ambulatory Visit: Payer: Self-pay

## 2020-04-10 DIAGNOSIS — Z7901 Long term (current) use of anticoagulants: Secondary | ICD-10-CM | POA: Diagnosis not present

## 2020-04-10 DIAGNOSIS — D509 Iron deficiency anemia, unspecified: Secondary | ICD-10-CM | POA: Insufficient documentation

## 2020-04-10 DIAGNOSIS — E538 Deficiency of other specified B group vitamins: Secondary | ICD-10-CM | POA: Insufficient documentation

## 2020-04-10 DIAGNOSIS — Z853 Personal history of malignant neoplasm of breast: Secondary | ICD-10-CM | POA: Insufficient documentation

## 2020-04-10 DIAGNOSIS — D472 Monoclonal gammopathy: Secondary | ICD-10-CM | POA: Diagnosis not present

## 2020-04-10 DIAGNOSIS — Z86718 Personal history of other venous thrombosis and embolism: Secondary | ICD-10-CM | POA: Insufficient documentation

## 2020-04-10 DIAGNOSIS — Z923 Personal history of irradiation: Secondary | ICD-10-CM | POA: Diagnosis not present

## 2020-04-10 DIAGNOSIS — Z86711 Personal history of pulmonary embolism: Secondary | ICD-10-CM | POA: Diagnosis not present

## 2020-04-10 DIAGNOSIS — Z9221 Personal history of antineoplastic chemotherapy: Secondary | ICD-10-CM | POA: Insufficient documentation

## 2020-04-10 DIAGNOSIS — Z85110 Personal history of malignant carcinoid tumor of bronchus and lung: Secondary | ICD-10-CM | POA: Insufficient documentation

## 2020-04-10 DIAGNOSIS — Z87891 Personal history of nicotine dependence: Secondary | ICD-10-CM | POA: Diagnosis not present

## 2020-04-10 LAB — CBC WITH DIFFERENTIAL/PLATELET
Abs Immature Granulocytes: 0.02 10*3/uL (ref 0.00–0.07)
Basophils Absolute: 0 10*3/uL (ref 0.0–0.1)
Basophils Relative: 1 %
Eosinophils Absolute: 0.2 10*3/uL (ref 0.0–0.5)
Eosinophils Relative: 2 %
HCT: 36.2 % (ref 36.0–46.0)
Hemoglobin: 11.8 g/dL — ABNORMAL LOW (ref 12.0–15.0)
Immature Granulocytes: 0 %
Lymphocytes Relative: 38 %
Lymphs Abs: 3.2 10*3/uL (ref 0.7–4.0)
MCH: 25.1 pg — ABNORMAL LOW (ref 26.0–34.0)
MCHC: 32.6 g/dL (ref 30.0–36.0)
MCV: 77 fL — ABNORMAL LOW (ref 80.0–100.0)
Monocytes Absolute: 0.7 10*3/uL (ref 0.1–1.0)
Monocytes Relative: 8 %
Neutro Abs: 4.4 10*3/uL (ref 1.7–7.7)
Neutrophils Relative %: 51 %
Platelets: 271 10*3/uL (ref 150–400)
RBC: 4.7 MIL/uL (ref 3.87–5.11)
RDW: 13.6 % (ref 11.5–15.5)
WBC: 8.5 10*3/uL (ref 4.0–10.5)
nRBC: 0 % (ref 0.0–0.2)

## 2020-04-10 LAB — IRON AND TIBC
Iron: 43 ug/dL (ref 28–170)
Saturation Ratios: 11 % (ref 10.4–31.8)
TIBC: 384 ug/dL (ref 250–450)
UIBC: 341 ug/dL

## 2020-04-10 LAB — FERRITIN: Ferritin: 23 ng/mL (ref 11–307)

## 2020-04-10 NOTE — Progress Notes (Signed)
[facility]  [address] [place], [place] [address] Phone: [phone]  Fax: [phone]   Telemedicine Office Visit:  04/11/2020  Referring physician: Erline Levine, MD  I connected with Rebecca Wolf on 04/11/2020 at 11:53 AM by videoconferencing and verified that I was speaking with the correct person using 2 identifiers.  The patient was at home.  I discussed the limitations, risk, security and privacy concerns of performing an evaluation and management service by videoconferencing and the availability of in person appointments.  I also discussed with the patient that there may be a patient responsible charge related to this service.  The patient expressed understanding and agreed to proceed.   Chief Complaint: Rebecca Wolf is a 86 y.o. female  with a history of breast cancer, carcinoid tumor, and DVT/PE who is seen for 6 month assessment.   HPI: The patient was last seen in the medical oncology clinic on 10/18/2019 via telemedicine. At that time, she denies any bleeding, flushing or diarrhea.  She denies any breast concerns.  Exam is stable. Hematocrit was 36.4, hemoglobin 11.5, platelets 249,000, WBC 8,000. Ferritin was 257. Folate was 13.6 and vitamin B12 was 270 (low). Hemoglobin A1c was 7.2. She continued Eliquis.   Labs on 01/10/2020 revealed a hematocrit of 35.7, hemoglobin 11.8, MCV 76.8, platelets 225,000, WBC 7,100. Ferritin was 57. SPEP was normal.  She received Vitamin B12 injections monthly x 6 (10/18/2019 - 03/24/2020).  During the interim, she has been fine. She is still taking Eliquis. She denies any breast concerns. Her elbows are still sore. Her shortness of breath and left ear popping have resolved. Her neuropathy has improved. She has diarrhea, but it is better than it has been.   Past Medical History:  Diagnosis Date  . Asthma   . Breast cancer ([place]) 2003   LT LUMPECTOMY  . Breast cancer ([place]) 2004   LT LUMPECTOMY   . 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  . GERD (gastroesophageal reflux disease)   . Hemorrhoids   . Hypertension   . Personal history of chemotherapy 2004   BREAST CA  . Personal history of radiation therapy 2003   BREAST CA  . Personal history of radiation therapy 2004   BREAST CA  . Polyp of colon 2002   bleeding polyp of right ascending colon  . PUD (peptic ulcer disease)   . Seizures ([place])    epilepsy well controlled    Past Surgical History:  Procedure Laterality Date  . BREAST EXCISIONAL BIOPSY Left 2003   positive  . BREAST EXCISIONAL BIOPSY Left 2004   positive  . BREAST LUMPECTOMY Left 2003   BREAST CA  . BREAST LUMPECTOMY Left 2004   BREAST CA  . HEMICOLECTOMY Right    bowel obstruction  . HEMORRHOID SURGERY    . THORACOTOMY/LOBECTOMY  1999   carcinoid tumor  . TOTAL VAGINAL HYSTERECTOMY    . VARICOSE VEIN SURGERY      Family History  Problem Relation Age of Onset  . Alcoholism Father   . Diabetes Sister   . Breast cancer Neg Hx     Social History:  reports that she has quit smoking. She has never used smokeless tobacco. She reports that she does not drink alcohol and does not use drugs. She has a daughter named [name]. The patient is accompanied by her daughter, [name], today.  Participants in the patient's visit and their role in the encounter included the patient and  Orlene Och, RN or Vito Berger, [place], today.  The intake visit was provided by Orlene Och, RN.  Allergies:  Allergies  Allergen Reactions  . Levofloxacin Shortness Of Breath  . Sulfa Antibiotics Rash    Current Medications: Current Outpatient Medications  Medication Sig Dispense Refill  . acetaminophen (TYLENOL) 500 MG tablet Take 500 mg by mouth every 6 (six) hours as needed.    Marland Kitchen albuterol (ACCUNEB) 1.25 MG/3ML nebulizer solution Inhale 1 ampule into the lungs 4 (four) times daily as needed.    Marland Kitchen amLODipine (NORVASC) 5 MG tablet TAKE 1 TABLET BY  MOUTH EVERY DAY (Patient taking differently: Take 2.5 mg by mouth daily. ) 90 tablet 0  . azelastine (ASTELIN) 0.1 % nasal spray U 1 SPRAY IEN BID TO REPLACE PATANASE    . azithromycin (ZITHROMAX) 250 MG tablet Take 250 mg by mouth daily.     . Cholecalciferol 25 MCG (1000 UT) tablet Take 1,000 Units by mouth daily.     Marland Kitchen ELIQUIS 2.5 MG TABS tablet TAKE 1 TABLET BY MOUTH TWICE DAILY 60 tablet 5  . fluticasone (FLONASE) 50 MCG/ACT nasal spray Place 2 sprays into the nose daily.    Marland Kitchen levalbuterol (XOPENEX) 0.63 MG/3ML nebulizer solution VVN Q 4 H PRN FOR WHZ    . levETIRAcetam (KEPPRA) 750 MG tablet Take 1 tablet by mouth 2 (two) times daily.    . montelukast (SINGULAIR) 10 MG tablet Take 1 tablet by mouth daily.    . pantoprazole (PROTONIX) 40 MG tablet TAKE 1 TABLET BY MOUTH TWICE DAILY 60 tablet 0  . prednisoLONE acetate (PRED FORTE) 1 % ophthalmic suspension INTILL 2 GTS INTO OU QID  0  . spironolactone-hydrochlorothiazide (ALDACTAZIDE) 25-25 MG tablet TAKE 1 TABLET BY MOUTH DAILY 90 tablet 0  . budesonide (PULMICORT) 0.5 MG/2ML nebulizer solution Inhale into the lungs.    Marland Kitchen diltiazem (CARDIZEM CD) 120 MG 24 hr capsule Take 120 mg by mouth daily.     . Fluticasone-Salmeterol (WIXELA INHUB) 500-50 MCG/DOSE AEPB Inhale 1 puff into the lungs 2 (two) times daily. (Patient not taking: Reported on 04/11/2020)    . Olopatadine HCl (PATANASE) 0.6 % SOLN Place 1 puff into the nose 2 (two) times daily. (Patient not taking: Reported on 04/11/2020)    . sucralfate (CARAFATE) 1 g tablet Take 1 tablet (1 g total) by mouth 4 (four) times daily. (Patient not taking: Reported on 04/11/2020) 352 tablet 2   No current facility-administered medications for this visit.     Review of Systems  Constitutional: Negative for chills, diaphoresis, fever, malaise/fatigue and weight loss (no new weight).  HENT: Positive for hearing loss (left ear). Negative for congestion, ear discharge, ear pain, nosebleeds, sinus  pain, sore throat and tinnitus.   Eyes: Negative.  Negative for blurred vision.  Respiratory: Negative for cough, hemoptysis, sputum production and shortness of breath.   Cardiovascular: Negative for chest pain, palpitations and leg swelling.  Gastrointestinal: Positive for diarrhea. Negative for abdominal pain, blood in stool, constipation, heartburn, melena, nausea and vomiting.  Genitourinary: Negative.  Negative for dysuria, frequency, hematuria and urgency.  Musculoskeletal: Positive for joint pain (elbows; sore). Negative for back pain, myalgias and neck pain.  Skin: Negative.  Negative for itching and rash.  Neurological: Positive for sensory change (numbness in hands and feet, better). Negative for dizziness, tingling, speech change, focal weakness, seizures, weakness and headaches.  Endo/Heme/Allergies: Does not bruise/bleed easily.  Psychiatric/Behavioral: Negative.  Negative for depression and memory loss. The patient is  not nervous/anxious and does not have insomnia.   All other systems reviewed and are negative.   Performance status (ECOG):  2  Physical Exam Nursing note reviewed.  Constitutional:      General: She is not in acute distress.    Appearance: She is well-developed. She is not diaphoretic.  HENT:     Head:     Comments: Graying hair pulled up in a bun. Eyes:     General: No scleral icterus.    Conjunctiva/sclera: Conjunctivae normal.     Pupils: Pupils are equal, round, and reactive to light.     Comments: Brown eyes.  Neurological:     Mental Status: She is alert and oriented to person, place, and time.  Psychiatric:        Behavior: Behavior normal.        Thought Content: Thought content normal.        Judgment: Judgment normal.    Appointment on 04/10/2020  Component Date Value Ref Range Status  . WBC 04/10/2020 8.5  4.0 - 10.5 K/uL Final  . RBC 04/10/2020 4.70  3.87 - 5.11 MIL/uL Final  . Hemoglobin 04/10/2020 11.8* 12.0 - 15.0 g/dL Final  . HCT  04/10/2020 36.2  36 - 46 % Final  . MCV 04/10/2020 77.0* 80.0 - 100.0 fL Final  . MCH 04/10/2020 25.1* 26.0 - 34.0 pg Final  . MCHC 04/10/2020 32.6  30.0 - 36.0 g/dL Final  . RDW 04/10/2020 13.6  11.5 - 15.5 % Final  . Platelets 04/10/2020 271  150 - 400 K/uL Final  . nRBC 04/10/2020 0.0  0.0 - 0.2 % Final  . Neutrophils Relative % 04/10/2020 51  % Final  . Neutro Abs 04/10/2020 4.4  1.7 - 7.7 K/uL Final  . Lymphocytes Relative 04/10/2020 38  % Final  . Lymphs Abs 04/10/2020 3.2  0.7 - 4.0 K/uL Final  . Monocytes Relative 04/10/2020 8  % Final  . Monocytes Absolute 04/10/2020 0.7  0.1 - 1.0 K/uL Final  . Eosinophils Relative 04/10/2020 2  % Final  . Eosinophils Absolute 04/10/2020 0.2  0 - 0 K/uL Final  . Basophils Relative 04/10/2020 1  % Final  . Basophils Absolute 04/10/2020 0.0  0 - 0 K/uL Final  . Immature Granulocytes 04/10/2020 0  % Final  . Abs Immature Granulocytes 04/10/2020 0.02  0.00 - 0.07 K/uL Final   Performed at [facility], [address] [place], [place] [address]  . Ferritin 04/10/2020 23  11 - 307 ng/mL Final   Performed at [facility], [address] [place], [place] [address]  . Iron 04/10/2020 43  28 - 170 ug/dL Final  . TIBC 04/10/2020 384  250 - 450 ug/dL Final  . Saturation Ratios 04/10/2020 11  10.4 - 31.8 % Final  . UIBC 04/10/2020 341  ug/dL Final   Performed at [facility], [address] [place], [place] [address]    Assessment:  Rebecca Wolf is a 86 y.o. female with a history of breast cancer, carcinoid tumor, and DVT/PE.  She was diagnosed with precancerous breast lesion (? DCIS) in 2003. She underwent lumpectomy. She developedinvasive lobular carcinomain 2007. She underwent segmental mastectomy and sentinel lymph node biopsy followed by radiation. No lymph nodes were involved. Tumor was ER/PR positive. She completed 5 years of Arimidexin 08/2011. Mammogramon 04/18/2018was negative. CA27.29was 20.7(normal)  on 09/24/2016 and 12.9 on 08/25/2019.  She was diagnosed with pulmonary carcinoidin 2001 after presenting with an abnormality  on chest CT. She was asymptomatic. She underwent resection. She has had no evidence of recurrent disease.  She has a history ofDVT x 2and pulmonary embolism x 2. She has been on anticoagulation for 8-10 years. Chest CT angiogramon 06/29/2017 revealed no evidence of significant pulmonary embolus.There was an unchanged appearance of right paratracheal nodule and multiple pulmonary nodules.She was initially on Coumadin, but switched toEliquisin 2016. She is unaware of any hypercoagulable work-up.   She has a history of GI bleedingin 2002. She is s/p partial right ascending hemicolectomy for a bleeding polyp. She has a history of reflux and peptic ulcer disease. Hematocrit is normal (37.8). MCV is microcytic. While in [place], a hemoglobin electrophoresis was performed (no result available). She was started on oral on 08/31/2012. She is currently not taking iron.  Patient received Venofer x 4 (09/13/2019 - 10/08/2019).   Ferritin has been followed: 43 on 09/24/2016, 80 on 09/23/2017, 12 on 08/25/2019, 20 on 09/13/2019, 237 on 10/15/2019, 57 on 01/10/2020, and 23 on 04/10/2020.  Work upon 08/25/2019 revealed a hematocrit 31.4, hemoglobin 9.9, MCV 75.3, platelets 281,000, WBC 9,300. Ferritin was 12 with an iron saturation of 7% and a TIBC of 420. Creatinine 1.10. Calcium, 8.5. Albumin 3.4.  M- spike was 0 gm/dL.  Vitamin B12 was 157 and folate 5.1. TSH was 2.163. Retic was 1.6%.   She has B12 deficiency.  B12 was 157 on 08/25/2019 and 270 on 10/15/2019.  She was briefly oral B12 (discontinued secondary to diarrhea).  She began B12 injections on 09/20/2019 (last 03/24/2020).  She has folate deficiency.  Folate was 5.1 on 08/25/2019, 6.8 on 09/13/2019 and 13.6 on 10/15/2019.  She is on oral folate.  She has a history of a monoclonal gammopathy of unknown  significance (MGUS).  M-spike was 0 on 08/25/2019 and 01/10/2020.  Symptomatically, shefeels fine. She is still taking Eliquis. She denies any breast concerns.  Diarrhea has improved.  Plan: 1.   Review labs from 04/10/2020. 2.   Iron deficiency anemia             Hematocrit 31.3.  Hemoglobin   9.7.  MCV 75.1 on 09/13/2019   Hematocrit 36.4.  Hemoglobin 11.5.  MCV 76.5 on 10/18/2019.             Hematocrit 36.2.  Hemoglobin 11.8.  MCV 77.0 on 04/10/2020.   Ferritin 257 to 57 to 23.  She received Alroy Bailiff (last 10/08/2019).  Guaiac cards x3 were negative.             She denies any bleeding.             Patient seen by Dr. Vicente Males in 2019 and declined endoscopy secondary to age and risks.  Discussed plans for Venofer weekly x2. 3.   B12 and folate deficiency             Folate 5.1 and B12 157 on 0/24/2021.             Folate 13.6 and B12 271 on 10/15/2019.  Patient began B12 injections on 09/20/2019 (last 03/24/2020).  Continue B12 monthly x6.  Check folate annually. 4.   History of invasive lobular cancer             Clinically, she denies any concerns             Anticipate physical exam at next visit.             Patient declines follow-up mammograms secondary to age. 5.  Carcinoid syndrome             Patient notes interval diarrhea, improving.  Continue to monitor. 6.   Recurrent DVT and pulmonary embolism             Patient remains on Eliquis.             He denies any bleeding. 7.   History of MGUS             M-spike was 0 on 01/10/2020.  Check SPEP annually unless any concerning symptoms. 8.   RTC for Venofer weekly x 2. 9.   RTC for B12 monthly x 6 (last 03/24/2020). 10.   RTC in 3 months for labs (CBC with diff, ferritin). 11.   RTC in 6 months for MD assessment, labs (CBC with diff, ferritin, iron studies- day before) and +/- Venofer.  I discussed the assessment and treatment plan with the patient.  The patient was provided an opportunity to ask questions and all were  answered.  The patient agreed with the plan and demonstrated an understanding of the instructions.  The patient was advised to call back or seek an in person evaluation if the symptoms worsen or if the condition fails to improve as anticipated.  I provided 12 minutes (11:53 AM - 12:04 PM) of face-to-face video visit time during this this encounter and > 50% was spent counseling as documented under my assessment and plan.  An additional 5 minutes were spent reviewing her chart (Epic and Care Everywhere) including notes, labs, and imaging studies.  I provided these services from the [facility] office.   Nolon Stalls, MD, PhD  04/11/2020, 11:53 AM  I, Mirian Mo Tufford, am acting as Education administrator for [employer]. Mike Gip, MD, PhD.  I, Naquita Nappier C. Mike Gip, MD, have reviewed the above documentation for accuracy and completeness, and I agree with the above.

## 2020-04-11 ENCOUNTER — Encounter: Payer: Self-pay | Admitting: Hematology and Oncology

## 2020-04-11 ENCOUNTER — Inpatient Hospital Stay (HOSPITAL_BASED_OUTPATIENT_CLINIC_OR_DEPARTMENT_OTHER): Payer: Medicare Other | Admitting: Hematology and Oncology

## 2020-04-11 DIAGNOSIS — E538 Deficiency of other specified B group vitamins: Secondary | ICD-10-CM | POA: Diagnosis not present

## 2020-04-11 DIAGNOSIS — I2782 Chronic pulmonary embolism: Secondary | ICD-10-CM

## 2020-04-11 DIAGNOSIS — Z853 Personal history of malignant neoplasm of breast: Secondary | ICD-10-CM | POA: Diagnosis not present

## 2020-04-11 DIAGNOSIS — D509 Iron deficiency anemia, unspecified: Secondary | ICD-10-CM | POA: Diagnosis not present

## 2020-04-11 DIAGNOSIS — Z85110 Personal history of malignant carcinoid tumor of bronchus and lung: Secondary | ICD-10-CM | POA: Diagnosis not present

## 2020-04-11 DIAGNOSIS — Z7901 Long term (current) use of anticoagulants: Secondary | ICD-10-CM

## 2020-04-11 NOTE — Progress Notes (Signed)
Daughter took her off of multivitamin due to stomach upset. She is trying to find a more natural vitamin that wont upset stomach for calcium and vitamin d mostly

## 2020-04-19 ENCOUNTER — Ambulatory Visit: Payer: Medicare Other

## 2020-04-19 ENCOUNTER — Other Ambulatory Visit: Payer: Self-pay

## 2020-04-19 DIAGNOSIS — R262 Difficulty in walking, not elsewhere classified: Secondary | ICD-10-CM

## 2020-04-19 DIAGNOSIS — M6281 Muscle weakness (generalized): Secondary | ICD-10-CM

## 2020-04-19 NOTE — Therapy (Signed)
[facility] [address], [place], [address] Phone: [phone]   Fax:  [phone]  Physical Therapy Treatment  Patient Details  Name: Rebecca Wolf MRN: [id number] Date of Birth: [birth year] Referring Provider (PT): Erline Levine MD   Encounter Date: 04/19/2020   PT End of Session - 04/19/20 1133    Visit Number 19    Number of Visits 21    Date for PT Re-Evaluation 05/24/20    Authorization Type 4 / 10 Medicare    PT Start Time 1115    PT Stop Time 1200    PT Time Calculation (min) 45 min    Equipment Utilized During Treatment Gait belt    Activity Tolerance Patient tolerated treatment well;Patient limited by fatigue    Behavior During Therapy [facility] for tasks assessed/performed           Past Medical History:  Diagnosis Date  . Asthma   . Breast cancer ([place]) 2003   LT LUMPECTOMY  . Breast cancer ([place]) 2004   LT LUMPECTOMY  . 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  . GERD (gastroesophageal reflux disease)   . Hemorrhoids   . Hypertension   . Personal history of chemotherapy 2004   BREAST CA  . Personal history of radiation therapy 2003   BREAST CA  . Personal history of radiation therapy 2004   BREAST CA  . Polyp of colon 2002   bleeding polyp of right ascending colon  . PUD (peptic ulcer disease)   . Seizures ([place])    epilepsy well controlled    Past Surgical History:  Procedure Laterality Date  . BREAST EXCISIONAL BIOPSY Left 2003   positive  . BREAST EXCISIONAL BIOPSY Left 2004   positive  . BREAST LUMPECTOMY Left 2003   BREAST CA  . BREAST LUMPECTOMY Left 2004   BREAST CA  . HEMICOLECTOMY Right    bowel obstruction  . HEMORRHOID SURGERY    . THORACOTOMY/LOBECTOMY  1999   carcinoid tumor  . TOTAL VAGINAL HYSTERECTOMY    . VARICOSE VEIN SURGERY      There were no vitals filed for this visit.   Subjective Assessment - 04/19/20 1131    Subjective Patient  states no major changes since the previous sesison. Patient states her elbow has been slowly improving.    Patient is accompained by: Family member    Pertinent History Anal Prolapse, Spinal stenosis, HTN,    Limitations Lifting;Sitting;Standing;Walking;House hold activities    How long can you stand comfortably? <76min    How long can you walk comfortably? 2 min with Rollator    Patient Stated Goals To move easier    Currently in Pain? No/denies    Pain Onset More than a month ago               TREATMENT  Therapeutic Exercise  Nustep in sitting level 3 with patient positioned -- x 10 min with focus on speed Ambulation with use of rollator -- x 218ft, x121ft  Ball kicks with B Ue support - x 20  Hand-held walking - x 72ft Ball toss with unilateral UE support - x 20 Hip Abduction in standing - x 10    Performed to improve strength     PT Education - 04/19/20 1339    Education Details form/technique with exercise    Person(s) Educated Patient    Methods Explanation;Demonstration    Comprehension Verbalized understanding;Returned demonstration  PT Long Term Goals - 03/01/20 0919      PT LONG TERM GOAL #1   Title Patient will be independent with HEP to continue benefits of therapy after discharge.    Baseline Dependent with exercise progression; 10/14/2019: not currently performing exercises; 12/22/2019: States she'll begin walking with the change of the weather    Time 6    Period Weeks    Status On-going      PT LONG TERM GOAL #2   Title Patient will improve TUG to under 12 seconds to indicate signficant improvement in fall risk.     Baseline TUG: 16 seconds; 10/14/2019: 26 sec; 12/22/2019: 21.3 sec; 03/01/20: 18 sec    Time 6    Period Weeks    Status On-going      PT LONG TERM GOAL #3   Title Patient will improve 18minWT to over 338ft to indicate functional improvement in walking tolerance and greater ability to walk in stores before requiring a  sitting rest break    Baseline 12ft stopped test at 55sec secondary to fatigue; 10/14/2019: 112ft in 80 sec; 12/22/2019: 236ft 106sec; 03/01/20: 245ft 120 sec    Time 6    Period Weeks    Status On-going      PT LONG TERM GOAL #4   Title Patient will demonstrate ability to stand for over 2 min to allow for improved ability to perform household chores at home.     Baseline able to stand for 21 sec; 10/14/2019: 30sec; 12/22/2019: 30 sec; 03/01/20: 100 sec    Time 6    Period Weeks    Status On-going      PT LONG TERM GOAL #5   Title Pt will decrease 5TSTS to <12 seconds from a chair at 19in in height in order to demonstrate clinically significant improvement in LE strength    Baseline 5xSTS: 16 sec from 21" height chair with UE support; 10/14/2019: 14.5 sec 22"; 12/22/2019: with B UE support, 24.7 w/o UE support and PT assistance; 03/01/20: 18sec from roll- aider (21in)    Time 6    Period Weeks    Status On-going                 Plan - 04/19/20 1340    Clinical Impression Statement Performed exercises to address limited ability to stand for prolonged periods of time and improve LE strength with standing and ambulating. Good performance with exercises, however continues to have difficulty with performing prolonged standing and ambulating with early onset of fatigue. Patient will benefit from further skilled therapy to return to prior level of function.    Personal Factors and Comorbidities Comorbidity 3+;Fitness    Comorbidities Chronic LBP, spinal stenosis, anal prolapse, HTN    Examination-Activity Limitations Bathing;Lift;Carry;Stand;Stairs;Squat;Sit;Transfers    Examination-Participation Restrictions Cleaning;Community Activity    Stability/Clinical Decision Making Evolving/Moderate complexity    Rehab Potential Fair    PT Frequency 2x / week    PT Duration 6 weeks    PT Treatment/Interventions Electrical Stimulation;Cryotherapy;Moist Heat;Ultrasound;Gait training;Stair  training;Functional mobility training;Neuromuscular re-education;Balance training;Therapeutic exercise;Therapeutic activities;Patient/family education;Manual techniques;Passive range of motion;Energy conservation;Wheelchair mobility training    PT Next Visit Plan endurance and strengthening exercise    PT Home Exercise Plan See education section    Consulted and Agree with Plan of Care Patient;Family member/caregiver    Family Member Consulted Daughter           Patient will benefit from skilled therapeutic intervention in order to improve the following deficits  and impairments:  Abnormal gait, Pain, Improper body mechanics, Decreased mobility, Decreased coordination, Increased muscle spasms, Postural dysfunction, Hypermobility, Decreased activity tolerance, Decreased endurance, Decreased range of motion, Decreased strength, Hypomobility, Decreased balance, Decreased safety awareness, Difficulty walking  Visit Diagnosis: Difficulty in walking, not elsewhere classified  Muscle weakness (generalized)     Problem List Patient Active Problem List   Diagnosis Date Noted  . B12 deficiency 10/17/2019  . Folate deficiency 10/17/2019  . Anemia 08/29/2019  . Iron deficiency anemia 08/26/2019  . Chronic anticoagulation 10/25/2018  . CKD stage G3b/A1, GFR 30-44 and albumin creatinine ratio <30 mg/g ([place]) 10/05/2018  . MGUS (monoclonal gammopathy of unknown significance) 07/23/2018  . Osteopenia after menopause 07/16/2018  . Diabetes mellitus with no complication ([place]) 77/05/6578  . Vitamin D deficiency 04/07/2018  . Heart palpitations 01/06/2018  . Tachycardia 01/06/2018  . Cystocele with rectocele 10/30/2017  . Rectocele 10/30/2017  . Microcytic red blood cells 09/23/2017  . Localized edema 02/13/2017  . Hemorrhoids 05/17/2016  . Obesity (BMI 30-39.9) 04/26/2016  . Multiple lung nodules 04/19/2016  . Chest pain 04/17/2016  . Partial symptomatic epilepsy with complex partial seizures,  not intractable, without status epilepticus ([place]) 06/30/2015  . Bronchiectasis without complication ([place]) 03/83/3383  . Imbalance 11/08/2014  . Essential (primary) hypertension 11/08/2014  . Personal history of malignant neoplasm of breast 11/08/2014  . History of malignant carcinoid tumor of bronchus and lung 11/08/2014  . H/O peptic ulcer 11/08/2014  . Asthma, mild intermittent 11/08/2014  . Pulmonary embolism ([place]) 11/08/2014  . Gonalgia 11/08/2014  . Leg varices 11/08/2014    Blythe Stanford, PT DPT 04/19/2020, 1:43 PM  [facility] [address] [place], [address] Phone: [phone]   Fax:  [phone]  Name: CYDNE GRAHN MRN: [id number] Date of Birth: 1924-12-06

## 2020-04-20 ENCOUNTER — Inpatient Hospital Stay: Payer: Medicare Other

## 2020-04-20 VITALS — BP 115/72 | HR 66 | Temp 96.0°F | Resp 18

## 2020-04-20 DIAGNOSIS — D509 Iron deficiency anemia, unspecified: Secondary | ICD-10-CM | POA: Diagnosis not present

## 2020-04-20 MED ORDER — IRON SUCROSE 20 MG/ML IV SOLN
200.0000 mg | Freq: Once | INTRAVENOUS | Status: AC
  Administered 2020-04-20: 200 mg via INTRAVENOUS
  Filled 2020-04-20: qty 10

## 2020-04-20 MED ORDER — SODIUM CHLORIDE 0.9 % IV SOLN
Freq: Once | INTRAVENOUS | Status: AC
  Filled 2020-04-20: qty 250

## 2020-04-20 MED ORDER — CYANOCOBALAMIN 1000 MCG/ML IJ SOLN
1000.0000 ug | Freq: Once | INTRAMUSCULAR | Status: AC
  Administered 2020-04-20: 1000 ug via INTRAMUSCULAR
  Filled 2020-04-20: qty 1

## 2020-04-20 NOTE — Progress Notes (Signed)
Pt tolerated infusion well. No s/s of distress or reaction noted. Pt and VS stable at discharge.  

## 2020-04-27 ENCOUNTER — Other Ambulatory Visit: Payer: Self-pay

## 2020-04-27 ENCOUNTER — Inpatient Hospital Stay: Payer: Medicare Other

## 2020-04-27 VITALS — BP 121/60 | HR 68 | Resp 18

## 2020-04-27 DIAGNOSIS — D509 Iron deficiency anemia, unspecified: Secondary | ICD-10-CM | POA: Diagnosis not present

## 2020-04-27 MED ORDER — SODIUM CHLORIDE 0.9 % IV SOLN
INTRAVENOUS | Status: DC
  Filled 2020-04-27: qty 250

## 2020-04-27 MED ORDER — IRON SUCROSE 20 MG/ML IV SOLN
200.0000 mg | Freq: Once | INTRAVENOUS | Status: AC
  Administered 2020-04-27: 200 mg via INTRAVENOUS
  Filled 2020-04-27: qty 10

## 2020-04-27 NOTE — Progress Notes (Signed)
Pt had tx as ordered, no issues, Pt stable at discharge

## 2020-05-01 ENCOUNTER — Ambulatory Visit: Payer: Medicare Other | Attending: Internal Medicine

## 2020-05-01 ENCOUNTER — Other Ambulatory Visit: Payer: Self-pay

## 2020-05-01 DIAGNOSIS — M6281 Muscle weakness (generalized): Secondary | ICD-10-CM | POA: Insufficient documentation

## 2020-05-01 DIAGNOSIS — Z9181 History of falling: Secondary | ICD-10-CM | POA: Diagnosis present

## 2020-05-01 DIAGNOSIS — R262 Difficulty in walking, not elsewhere classified: Secondary | ICD-10-CM

## 2020-05-01 NOTE — Therapy (Signed)
[facility] [address], [place], [address] Phone: [phone]   Fax:  [phone]  Physical Therapy Treatment  Patient Details  Name: Rebecca Wolf MRN: [id number] Date of Birth: 04/05/1925 Referring Provider (PT): Erline Levine MD   Encounter Date: 05/01/2020   PT End of Session - 05/01/20 0956    Visit Number 20    Number of Visits 21    Date for PT Re-Evaluation 05/24/20    Authorization Type 5 / 10 Medicare    PT Start Time 0945    PT Stop Time 1030    PT Time Calculation (min) 45 min    Equipment Utilized During Treatment Gait belt    Activity Tolerance Patient tolerated treatment well;Patient limited by fatigue    Behavior During Therapy [facility] for tasks assessed/performed           Past Medical History:  Diagnosis Date  . Asthma   . Breast cancer ([place]) 2003   LT LUMPECTOMY  . Breast cancer ([place]) 2004   LT LUMPECTOMY  . 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  . GERD (gastroesophageal reflux disease)   . Hemorrhoids   . Hypertension   . Personal history of chemotherapy 2004   BREAST CA  . Personal history of radiation therapy 2003   BREAST CA  . Personal history of radiation therapy 2004   BREAST CA  . Polyp of colon 2002   bleeding polyp of right ascending colon  . PUD (peptic ulcer disease)   . Seizures ([place])    epilepsy well controlled    Past Surgical History:  Procedure Laterality Date  . BREAST EXCISIONAL BIOPSY Left 2003   positive  . BREAST EXCISIONAL BIOPSY Left 2004   positive  . BREAST LUMPECTOMY Left 2003   BREAST CA  . BREAST LUMPECTOMY Left 2004   BREAST CA  . HEMICOLECTOMY Right    bowel obstruction  . HEMORRHOID SURGERY    . THORACOTOMY/LOBECTOMY  1999   carcinoid tumor  . TOTAL VAGINAL HYSTERECTOMY    . VARICOSE VEIN SURGERY      There were no vitals filed for this visit.   Subjective Assessment - 05/01/20 0952    Subjective Patient  reports no significant changes since the previous session. Patient states she has been performing activities around the house, falls denied. Patient states no major changes along the elbow.    Patient is accompained by: Family member    Pertinent History Anal Prolapse, Spinal stenosis, HTN,    Limitations Lifting;Sitting;Standing;Walking;House hold activities    How long can you stand comfortably? <20min    How long can you walk comfortably? 2 min with Rollator    Patient Stated Goals To move easier    Currently in Pain? No/denies    Pain Onset More than a month ago               TREATMENT   Therapeutic Exercise   Nustep in sitting level 3 with patient positioned -- x 5 min with focus on speed Ambulation with use of rollator -- x 354ft; x 140ft - with 4 standing rest breaks lasting <10sec Mini squats in standing with UE support - x 20  Hip Abduction in standing - x 10 with YTB around ankles Ball kicks with B Ue support - x 20  Hip extension in standing -- x 10  With UE support    Performed to improve strength  PT Education - 05/01/20 0955    Education Details form/technique with exercise    Person(s) Educated Patient    Methods Explanation;Demonstration    Comprehension Verbalized understanding;Returned demonstration               PT Long Term Goals - 03/01/20 0919      PT LONG TERM GOAL #1   Title Patient will be independent with HEP to continue benefits of therapy after discharge.    Baseline Dependent with exercise progression; 10/14/2019: not currently performing exercises; 12/22/2019: States she'll begin walking with the change of the weather    Time 6    Period Weeks    Status On-going      PT LONG TERM GOAL #2   Title Patient will improve TUG to under 12 seconds to indicate signficant improvement in fall risk.     Baseline TUG: 16 seconds; 10/14/2019: 26 sec; 12/22/2019: 21.3 sec; 03/01/20: 18 sec    Time 6    Period Weeks    Status On-going      PT  LONG TERM GOAL #3   Title Patient will improve 1minWT to over 3104ft to indicate functional improvement in walking tolerance and greater ability to walk in stores before requiring a sitting rest break    Baseline 123ft stopped test at 55sec secondary to fatigue; 10/14/2019: 147ft in 80 sec; 12/22/2019: 248ft 106sec; 03/01/20: 264ft 120 sec    Time 6    Period Weeks    Status On-going      PT LONG TERM GOAL #4   Title Patient will demonstrate ability to stand for over 2 min to allow for improved ability to perform household chores at home.     Baseline able to stand for 21 sec; 10/14/2019: 30sec; 12/22/2019: 30 sec; 03/01/20: 100 sec    Time 6    Period Weeks    Status On-going      PT LONG TERM GOAL #5   Title Pt will decrease 5TSTS to <12 seconds from a chair at 19in in height in order to demonstrate clinically significant improvement in LE strength    Baseline 5xSTS: 16 sec from 21" height chair with UE support; 10/14/2019: 14.5 sec 22"; 12/22/2019: with B UE support, 24.7 w/o UE support and PT assistance; 03/01/20: 18sec from roll- aider (21in)    Time 6    Period Weeks    Status On-going                 Plan - 05/01/20 1002    Clinical Impression Statement Patient with improvement with performance of walking with rollator today able to amb for longer periods of times with less overall rest breaks compared to previous sessions. Continued to address generalized weakness with functional based exercises focused on improving hip and LE strength. Patient will benefit from further skilled therapy to return to prior level of function.    Personal Factors and Comorbidities Comorbidity 3+;Fitness    Comorbidities Chronic LBP, spinal stenosis, anal prolapse, HTN    Examination-Activity Limitations Bathing;Lift;Carry;Stand;Stairs;Squat;Sit;Transfers    Examination-Participation Restrictions Cleaning;Community Activity    Stability/Clinical Decision Making Evolving/Moderate complexity    Rehab  Potential Fair    PT Frequency 2x / week    PT Duration 6 weeks    PT Treatment/Interventions Electrical Stimulation;Cryotherapy;Moist Heat;Ultrasound;Gait training;Stair training;Functional mobility training;Neuromuscular re-education;Balance training;Therapeutic exercise;Therapeutic activities;Patient/family education;Manual techniques;Passive range of motion;Energy conservation;Wheelchair mobility training    PT Next Visit Plan endurance and strengthening exercise    PT Home Exercise  Plan See education section    Consulted and Agree with Plan of Care Patient;Family member/caregiver    Family Member Consulted Daughter           Patient will benefit from skilled therapeutic intervention in order to improve the following deficits and impairments:  Abnormal gait, Pain, Improper body mechanics, Decreased mobility, Decreased coordination, Increased muscle spasms, Postural dysfunction, Hypermobility, Decreased activity tolerance, Decreased endurance, Decreased range of motion, Decreased strength, Hypomobility, Decreased balance, Decreased safety awareness, Difficulty walking  Visit Diagnosis: Difficulty in walking, not elsewhere classified  Muscle weakness (generalized)  History of falling     Problem List Patient Active Problem List   Diagnosis Date Noted  . B12 deficiency 10/17/2019  . Folate deficiency 10/17/2019  . Anemia 08/29/2019  . Iron deficiency anemia 08/26/2019  . Chronic anticoagulation 10/25/2018  . CKD stage G3b/A1, GFR 30-44 and albumin creatinine ratio <30 mg/g ([place]) 10/05/2018  . MGUS (monoclonal gammopathy of unknown significance) 07/23/2018  . Osteopenia after menopause 07/16/2018  . Diabetes mellitus with no complication ([place]) 07/86/7544  . Vitamin D deficiency 04/07/2018  . Heart palpitations 01/06/2018  . Tachycardia 01/06/2018  . Cystocele with rectocele 10/30/2017  . Rectocele 10/30/2017  . Microcytic red blood cells 09/23/2017  . Localized edema  02/13/2017  . Hemorrhoids 05/17/2016  . Obesity (BMI 30-39.9) 04/26/2016  . Multiple lung nodules 04/19/2016  . Chest pain 04/17/2016  . Partial symptomatic epilepsy with complex partial seizures, not intractable, without status epilepticus ([place]) 06/30/2015  . Bronchiectasis without complication ([place]) 92/07/69  . Imbalance 11/08/2014  . Essential (primary) hypertension 11/08/2014  . Personal history of malignant neoplasm of breast 11/08/2014  . History of malignant carcinoid tumor of bronchus and lung 11/08/2014  . H/O peptic ulcer 11/08/2014  . Asthma, mild intermittent 11/08/2014  . Pulmonary embolism ([place]) 11/08/2014  . Gonalgia 11/08/2014  . Leg varices 11/08/2014    Blythe Stanford, PT DPT 05/01/2020, 10:15 AM  [facility] [address] [place], [address] Phone: [phone]   Fax:  [phone]  Name: Rebecca Wolf MRN: [id number] Date of Birth: 30-Nov-1924

## 2020-05-09 ENCOUNTER — Ambulatory Visit: Payer: Medicare Other

## 2020-05-10 ENCOUNTER — Other Ambulatory Visit: Payer: Self-pay

## 2020-05-10 ENCOUNTER — Ambulatory Visit: Payer: Medicare Other

## 2020-05-10 DIAGNOSIS — R262 Difficulty in walking, not elsewhere classified: Secondary | ICD-10-CM

## 2020-05-10 DIAGNOSIS — M6281 Muscle weakness (generalized): Secondary | ICD-10-CM

## 2020-05-10 DIAGNOSIS — Z9181 History of falling: Secondary | ICD-10-CM

## 2020-05-10 NOTE — Therapy (Signed)
[facility] [address] [place], [address] Phone: [phone]   Fax:  [phone]  Physical Therapy Treatment  Patient Details  Name: Rebecca Wolf MRN: [id number] Date of Birth: [birth year] Referring Provider (PT): Erline Levine MD   Encounter Date: 05/10/2020   PT End of Session - 05/10/20 1353    Visit Number 21    Number of Visits 21    Date for PT Re-Evaluation 05/24/20    Authorization Type 6 / 10 Medicare    PT Start Time 7169    PT Stop Time 1430    PT Time Calculation (min) 45 min    Equipment Utilized During Treatment Gait belt    Activity Tolerance Patient tolerated treatment well;Patient limited by fatigue    Behavior During Therapy [facility] for tasks assessed/performed           Past Medical History:  Diagnosis Date  . Asthma   . Breast cancer ([place]) 2003   LT LUMPECTOMY  . Breast cancer ([place]) 2004   LT LUMPECTOMY  . 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  . GERD (gastroesophageal reflux disease)   . Hemorrhoids   . Hypertension   . Personal history of chemotherapy 2004   BREAST CA  . Personal history of radiation therapy 2003   BREAST CA  . Personal history of radiation therapy 2004   BREAST CA  . Polyp of colon 2002   bleeding polyp of right ascending colon  . PUD (peptic ulcer disease)   . Seizures ([place])    epilepsy well controlled    Past Surgical History:  Procedure Laterality Date  . BREAST EXCISIONAL BIOPSY Left 2003   positive  . BREAST EXCISIONAL BIOPSY Left 2004   positive  . BREAST LUMPECTOMY Left 2003   BREAST CA  . BREAST LUMPECTOMY Left 2004   BREAST CA  . HEMICOLECTOMY Right    bowel obstruction  . HEMORRHOID SURGERY    . THORACOTOMY/LOBECTOMY  1999   carcinoid tumor  . TOTAL VAGINAL HYSTERECTOMY    . VARICOSE VEIN SURGERY      There were no vitals filed for this visit.   Subjective Assessment - 05/10/20 1352    Subjective Patient  states she would like to work more on walking during today's session. Patient denies any falls.    Patient is accompained by: Family member    Pertinent History Anal Prolapse, Spinal stenosis, HTN,    Limitations Lifting;Sitting;Standing;Walking;House hold activities    How long can you stand comfortably? <56min    How long can you walk comfortably? 2 min with Rollator    Patient Stated Goals To move easier    Currently in Pain? No/denies    Pain Onset More than a month ago             TREATMENT  Therapeutic Exercise  Nustepin sitting level 3 with patient positioned -- x 5 min with focus on speed Ambulationwith use of rollator -- x 319ft; x 169ft - with 4 standing rest breaks lasting <10sec Mini squats in standing with UE support - x 20  Hip Abduction in standing -x 10 with YTB around ankles Ball kicks with B Ue support -x 20   Peformed to improve functional strength        PT Education - 05/10/20 1353    Education Details form/technique with exercise    Person(s) Educated Patient    Methods Explanation;Demonstration    Comprehension  Verbalized understanding;Returned demonstration               PT Long Term Goals - 03/01/20 0919      PT LONG TERM GOAL #1   Title Patient will be independent with HEP to continue benefits of therapy after discharge.    Baseline Dependent with exercise progression; 10/14/2019: not currently performing exercises; 12/22/2019: States she'll begin walking with the change of the weather    Time 6    Period Weeks    Status On-going      PT LONG TERM GOAL #2   Title Patient will improve TUG to under 12 seconds to indicate signficant improvement in fall risk.     Baseline TUG: 16 seconds; 10/14/2019: 26 sec; 12/22/2019: 21.3 sec; 03/01/20: 18 sec    Time 6    Period Weeks    Status On-going      PT LONG TERM GOAL #3   Title Patient will improve 52minWT to over 35ft to indicate functional improvement in walking tolerance and  greater ability to walk in stores before requiring a sitting rest break    Baseline 177ft stopped test at 55sec secondary to fatigue; 10/14/2019: 136ft in 80 sec; 12/22/2019: 236ft 106sec; 03/01/20: 287ft 120 sec    Time 6    Period Weeks    Status On-going      PT LONG TERM GOAL #4   Title Patient will demonstrate ability to stand for over 2 min to allow for improved ability to perform household chores at home.     Baseline able to stand for 21 sec; 10/14/2019: 30sec; 12/22/2019: 30 sec; 03/01/20: 100 sec    Time 6    Period Weeks    Status On-going      PT LONG TERM GOAL #5   Title Pt will decrease 5TSTS to <12 seconds from a chair at 19in in height in order to demonstrate clinically significant improvement in LE strength    Baseline 5xSTS: 16 sec from 21" height chair with UE support; 10/14/2019: 14.5 sec 22"; 12/22/2019: with B UE support, 24.7 w/o UE support and PT assistance; 03/01/20: 18sec from roll- aider (21in)    Time 6    Period Weeks    Status On-going                 Plan - 05/10/20 1401    Clinical Impression Statement Patient able to perform 3 laps of ambulation with less sitting rest breaks compared to previous sessions indicating functional carryover between sessions. Continued to focus on improving functional LE strength throughout the session. Patient requires exercises to be performed throughout shortened ROM. Patient will benefit form further skilled therapy to return to prior level of function.    Personal Factors and Comorbidities Comorbidity 3+;Fitness    Comorbidities Chronic LBP, spinal stenosis, anal prolapse, HTN    Examination-Activity Limitations Bathing;Lift;Carry;Stand;Stairs;Squat;Sit;Transfers    Examination-Participation Restrictions Cleaning;Community Activity    Stability/Clinical Decision Making Evolving/Moderate complexity    Rehab Potential Fair    PT Frequency 2x / week    PT Duration 6 weeks    PT Treatment/Interventions Electrical  Stimulation;Cryotherapy;Moist Heat;Ultrasound;Gait training;Stair training;Functional mobility training;Neuromuscular re-education;Balance training;Therapeutic exercise;Therapeutic activities;Patient/family education;Manual techniques;Passive range of motion;Energy conservation;Wheelchair mobility training    PT Next Visit Plan endurance and strengthening exercise    PT Home Exercise Plan See education section    Consulted and Agree with Plan of Care Patient;Family member/caregiver    Family Member Consulted Daughter  Patient will benefit from skilled therapeutic intervention in order to improve the following deficits and impairments:  Abnormal gait, Pain, Improper body mechanics, Decreased mobility, Decreased coordination, Increased muscle spasms, Postural dysfunction, Hypermobility, Decreased activity tolerance, Decreased endurance, Decreased range of motion, Decreased strength, Hypomobility, Decreased balance, Decreased safety awareness, Difficulty walking  Visit Diagnosis: Difficulty in walking, not elsewhere classified  Muscle weakness (generalized)  History of falling     Problem List Patient Active Problem List   Diagnosis Date Noted  . B12 deficiency 10/17/2019  . Folate deficiency 10/17/2019  . Anemia 08/29/2019  . Iron deficiency anemia 08/26/2019  . Chronic anticoagulation 10/25/2018  . CKD stage G3b/A1, GFR 30-44 and albumin creatinine ratio <30 mg/g ([place]) 10/05/2018  . MGUS (monoclonal gammopathy of unknown significance) 07/23/2018  . Osteopenia after menopause 07/16/2018  . Diabetes mellitus with no complication ([place]) 09/38/1829  . Vitamin D deficiency 04/07/2018  . Heart palpitations 01/06/2018  . Tachycardia 01/06/2018  . Cystocele with rectocele 10/30/2017  . Rectocele 10/30/2017  . Microcytic red blood cells 09/23/2017  . Localized edema 02/13/2017  . Hemorrhoids 05/17/2016  . Obesity (BMI 30-39.9) 04/26/2016  . Multiple lung nodules 04/19/2016   . Chest pain 04/17/2016  . Partial symptomatic epilepsy with complex partial seizures, not intractable, without status epilepticus ([place]) 06/30/2015  . Bronchiectasis without complication ([place]) 93/71/6967  . Imbalance 11/08/2014  . Essential (primary) hypertension 11/08/2014  . Personal history of malignant neoplasm of breast 11/08/2014  . History of malignant carcinoid tumor of bronchus and lung 11/08/2014  . H/O peptic ulcer 11/08/2014  . Asthma, mild intermittent 11/08/2014  . Pulmonary embolism ([place]) 11/08/2014  . Gonalgia 11/08/2014  . Leg varices 11/08/2014    Blythe Stanford, PT DPT 05/10/2020, 2:12 PM  [facility] [address] [place], [address] Phone: [phone]   Fax:  [phone]  Name: Rebecca Wolf MRN: [id number] Date of Birth: 06-15-1925

## 2020-05-16 ENCOUNTER — Ambulatory Visit: Payer: Medicare Other

## 2020-05-16 ENCOUNTER — Other Ambulatory Visit: Payer: Self-pay

## 2020-05-16 DIAGNOSIS — R262 Difficulty in walking, not elsewhere classified: Secondary | ICD-10-CM

## 2020-05-16 DIAGNOSIS — Z9181 History of falling: Secondary | ICD-10-CM

## 2020-05-16 DIAGNOSIS — M6281 Muscle weakness (generalized): Secondary | ICD-10-CM

## 2020-05-16 NOTE — Therapy (Signed)
[facility] [address], [place], [address] Phone: [phone]   Fax:  [phone]  Physical Therapy Treatment  Patient Details  Name: Rebecca Wolf MRN: [id number] Date of Birth: [birth year] Referring Provider (PT): Kimel-Scott, Santiago Glad MD   Encounter Date: 05/16/2020   PT End of Session - 05/16/20 1003    Visit Number 22    Number of Visits 23    Date for PT Re-Evaluation 05/24/20    Authorization Type 7 / 10 Medicare    PT Start Time 0945    PT Stop Time 1030    PT Time Calculation (min) 45 min    Equipment Utilized During Treatment Gait belt    Activity Tolerance Patient tolerated treatment well;Patient limited by fatigue    Behavior During Therapy [facility] for tasks assessed/performed           Past Medical History:  Diagnosis Date   Asthma    Breast cancer ([place]) 2003   LT LUMPECTOMY   Breast cancer ([place]) 2004   LT LUMPECTOMY   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   GERD (gastroesophageal reflux disease)    Hemorrhoids    Hypertension    Personal history of chemotherapy 2004   BREAST CA   Personal history of radiation therapy 2003   BREAST CA   Personal history of radiation therapy 2004   BREAST CA   Polyp of colon 2002   bleeding polyp of right ascending colon   PUD (peptic ulcer disease)    Seizures ([place])    epilepsy well controlled    Past Surgical History:  Procedure Laterality Date   BREAST EXCISIONAL BIOPSY Left 2003   positive   BREAST EXCISIONAL BIOPSY Left 2004   positive   BREAST LUMPECTOMY Left 2003   BREAST CA   BREAST LUMPECTOMY Left 2004   BREAST CA   HEMICOLECTOMY Right    bowel obstruction   HEMORRHOID SURGERY     THORACOTOMY/LOBECTOMY  1999   carcinoid tumor   TOTAL VAGINAL HYSTERECTOMY     VARICOSE VEIN SURGERY      There were no vitals filed for this visit.   Subjective Assessment - 05/16/20 0955    Subjective Patient  states she would like to walk during today's visit. Reports no major changes or falls since the previous session.    Patient is accompained by: Family member    Pertinent History Anal Prolapse, Spinal stenosis, HTN,    Limitations Lifting;Sitting;Standing;Walking;House hold activities    How long can you stand comfortably? <31min    How long can you walk comfortably? 2 min with Rollator    Patient Stated Goals To move easier    Currently in Pain? No/denies    Pain Onset More than a month ago               TREATMENT   Therapeutic Exercise   Nustep in sitting level 3 with patient positioned -- x 5 min with focus on speed Ambulation with use of rollator -- x 259ft; x 155ft - with 3 standing rest breaks lasting <10sec Marches in standing with B UE support - x 12 Mini squats in standing with UE support - x 20  Sit to standing with UE support - x 5 Ball kicks with B Ue support - x 20    Peformed to improve functional strength         PT Education - 05/16/20 7588  Education Details form/technique with exercise    Person(s) Educated Patient    Methods Explanation;Demonstration    Comprehension Verbalized understanding;Returned demonstration               PT Long Term Goals - 03/01/20 0919      PT LONG TERM GOAL #1   Title Patient will be independent with HEP to continue benefits of therapy after discharge.    Baseline Dependent with exercise progression; 10/14/2019: not currently performing exercises; 12/22/2019: States she'll begin walking with the change of the weather    Time 6    Period Weeks    Status On-going      PT LONG TERM GOAL #2   Title Patient will improve TUG to under 12 seconds to indicate signficant improvement in fall risk.     Baseline TUG: 16 seconds; 10/14/2019: 26 sec; 12/22/2019: 21.3 sec; 03/01/20: 18 sec    Time 6    Period Weeks    Status On-going      PT LONG TERM GOAL #3   Title Patient will improve 59minWT to over 344ft to indicate  functional improvement in walking tolerance and greater ability to walk in stores before requiring a sitting rest break    Baseline 153ft stopped test at 55sec secondary to fatigue; 10/14/2019: 181ft in 80 sec; 12/22/2019: 245ft 106sec; 03/01/20: 29ft 120 sec    Time 6    Period Weeks    Status On-going      PT LONG TERM GOAL #4   Title Patient will demonstrate ability to stand for over 2 min to allow for improved ability to perform household chores at home.     Baseline able to stand for 21 sec; 10/14/2019: 30sec; 12/22/2019: 30 sec; 03/01/20: 100 sec    Time 6    Period Weeks    Status On-going      PT LONG TERM GOAL #5   Title Pt will decrease 5TSTS to <12 seconds from a chair at 19in in height in order to demonstrate clinically significant improvement in LE strength    Baseline 5xSTS: 16 sec from 21" height chair with UE support; 10/14/2019: 14.5 sec 22"; 12/22/2019: with B UE support, 24.7 w/o UE support and PT assistance; 03/01/20: 18sec from roll- aider (21in)    Time 6    Period Weeks    Status On-going                 Plan - 05/16/20 1011    Clinical Impression Statement Performed exercises to further address functional lower extremity strength. Patient with increased fatigue today; with inability to ambulate for prolonged periods of time (231ft vs 375ft). Patient will benefit from further skilled therapy focused on imrpoving these limtiations and further decrease fall risk.    Personal Factors and Comorbidities Comorbidity 3+;Fitness    Comorbidities Chronic LBP, spinal stenosis, anal prolapse, HTN    Examination-Activity Limitations Bathing;Lift;Carry;Stand;Stairs;Squat;Sit;Transfers    Examination-Participation Restrictions Cleaning;Community Activity    Stability/Clinical Decision Making Evolving/Moderate complexity    Rehab Potential Fair    PT Frequency 2x / week    PT Duration 6 weeks    PT Treatment/Interventions Electrical Stimulation;Cryotherapy;Moist  Heat;Ultrasound;Gait training;Stair training;Functional mobility training;Neuromuscular re-education;Balance training;Therapeutic exercise;Therapeutic activities;Patient/family education;Manual techniques;Passive range of motion;Energy conservation;Wheelchair mobility training    PT Next Visit Plan endurance and strengthening exercise    PT Home Exercise Plan See education section    Consulted and Agree with Plan of Care Patient;Family member/caregiver    Family Member Consulted  Daughter           Patient will benefit from skilled therapeutic intervention in order to improve the following deficits and impairments:  Abnormal gait, Pain, Improper body mechanics, Decreased mobility, Decreased coordination, Increased muscle spasms, Postural dysfunction, Hypermobility, Decreased activity tolerance, Decreased endurance, Decreased range of motion, Decreased strength, Hypomobility, Decreased balance, Decreased safety awareness, Difficulty walking  Visit Diagnosis: Difficulty in walking, not elsewhere classified  History of falling  Muscle weakness (generalized)     Problem List Patient Active Problem List   Diagnosis Date Noted   B12 deficiency 10/17/2019   Folate deficiency 10/17/2019   Anemia 08/29/2019   Iron deficiency anemia 08/26/2019   Chronic anticoagulation 10/25/2018   CKD stage G3b/A1, GFR 30-44 and albumin creatinine ratio <30 mg/g (HCC) 10/05/2018   MGUS (monoclonal gammopathy of unknown significance) 07/23/2018   Osteopenia after menopause 07/16/2018   Diabetes mellitus with no complication ([place]) 92/42/6834   Vitamin D deficiency 04/07/2018   Heart palpitations 01/06/2018   Tachycardia 01/06/2018   Cystocele with rectocele 10/30/2017   Rectocele 10/30/2017   Microcytic red blood cells 09/23/2017   Localized edema 02/13/2017   Hemorrhoids 05/17/2016   Obesity (BMI 30-39.9) 04/26/2016   Multiple lung nodules 04/19/2016   Chest pain 04/17/2016    Partial symptomatic epilepsy with complex partial seizures, not intractable, without status epilepticus ([place]) 06/30/2015   Bronchiectasis without complication ([place]) 19/62/2297   Imbalance 11/08/2014   Essential (primary) hypertension 11/08/2014   Personal history of malignant neoplasm of breast 11/08/2014   History of malignant carcinoid tumor of bronchus and lung 11/08/2014   H/O peptic ulcer 11/08/2014   Asthma, mild intermittent 11/08/2014   Pulmonary embolism ([place]) 11/08/2014   Gonalgia 11/08/2014   Leg varices 11/08/2014    Blythe Stanford, PT DPT 05/16/2020, 4:47 PM  [place] [facility] [address], [place], [address] Phone: [phone]   Fax:  [phone]  Name: Rebecca Wolf MRN: [id number] Date of Birth: 03/11/1925

## 2020-05-19 ENCOUNTER — Ambulatory Visit: Payer: Medicare Other | Attending: Internal Medicine

## 2020-05-19 ENCOUNTER — Other Ambulatory Visit: Payer: Self-pay | Admitting: Internal Medicine

## 2020-05-19 ENCOUNTER — Inpatient Hospital Stay: Payer: Medicare Other

## 2020-05-19 DIAGNOSIS — Z23 Encounter for immunization: Secondary | ICD-10-CM

## 2020-05-19 NOTE — Progress Notes (Signed)
   Covid-19 Vaccination Clinic  Name:  Rebecca Wolf    MRN: [id number] DOB: 02/16/1925  05/19/2020  Rebecca Wolf was observed post Covid-19 immunization for 30 minutes based on pre-vaccination screening without incident. She was provided with Vaccine Information Sheet and instruction to access the V-Safe system.   Rebecca Wolf was instructed to call 911 with any severe reactions post vaccine: Marland Kitchen Difficulty breathing  . Swelling of face and throat  . A fast heartbeat  . A bad rash all over body  . Dizziness and weakness   Immunizations Administered    Name Date Dose VIS Date Route   Pfizer COVID-19 Vaccine 05/19/2020 11:05 AM 0.3 mL 04/19/2020 Intramuscular   Manufacturer: [place]   Lot: [id number]   [place]: [id number]

## 2020-05-23 ENCOUNTER — Ambulatory Visit: Payer: Medicare Other

## 2020-05-23 ENCOUNTER — Other Ambulatory Visit: Payer: Self-pay

## 2020-05-23 DIAGNOSIS — M6281 Muscle weakness (generalized): Secondary | ICD-10-CM

## 2020-05-23 DIAGNOSIS — Z9181 History of falling: Secondary | ICD-10-CM

## 2020-05-23 DIAGNOSIS — R262 Difficulty in walking, not elsewhere classified: Secondary | ICD-10-CM | POA: Diagnosis not present

## 2020-05-23 NOTE — Therapy (Signed)
[place] [address], [place], [address] Phone: [phone]   Fax:  [phone]  Physical Therapy Treatment  Patient Details  Name: Rebecca Wolf MRN: [id number] Date of Birth: 1925/01/10 Referring Provider (PT): Erline Levine MD   Encounter Date: 05/23/2020   PT End of Session - 05/23/20 1020    Visit Number 23    Number of Visits 23    Date for PT Re-Evaluation 05/24/20    Authorization Type 8 / 10 Medicare    PT Start Time 1000    PT Stop Time 1045    PT Time Calculation (min) 45 min    Equipment Utilized During Treatment Gait belt    Activity Tolerance Patient tolerated treatment well;Patient limited by fatigue    Behavior During Therapy [facility] for tasks assessed/performed           Past Medical History:  Diagnosis Date  . Asthma   . Breast cancer ([place]) 2003   LT LUMPECTOMY  . Breast cancer ([place]) 2004   LT LUMPECTOMY  . 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  . GERD (gastroesophageal reflux disease)   . Hemorrhoids   . Hypertension   . Personal history of chemotherapy 2004   BREAST CA  . Personal history of radiation therapy 2003   BREAST CA  . Personal history of radiation therapy 2004   BREAST CA  . Polyp of colon 2002   bleeding polyp of right ascending colon  . PUD (peptic ulcer disease)   . Seizures ([place])    epilepsy well controlled    Past Surgical History:  Procedure Laterality Date  . BREAST EXCISIONAL BIOPSY Left 2003   positive  . BREAST EXCISIONAL BIOPSY Left 2004   positive  . BREAST LUMPECTOMY Left 2003   BREAST CA  . BREAST LUMPECTOMY Left 2004   BREAST CA  . HEMICOLECTOMY Right    bowel obstruction  . HEMORRHOID SURGERY    . THORACOTOMY/LOBECTOMY  1999   carcinoid tumor  . TOTAL VAGINAL HYSTERECTOMY    . VARICOSE VEIN SURGERY      There were no vitals filed for this visit.   Subjective Assessment - 05/23/20 1019    Subjective Patient  states no major changes since the previous session.    Patient is accompained by: Family member    Pertinent History Anal Prolapse, Spinal stenosis, HTN,    Limitations Lifting;Sitting;Standing;Walking;House hold activities    How long can you stand comfortably? <74min    How long can you walk comfortably? 2 min with Rollator    Patient Stated Goals To move easier    Currently in Pain? No/denies    Pain Onset More than a month ago             TREATMENT Therapeutic Exercise   Nustep in sitting level 3 with patient positioned -- x 5 min with focus on speed Ambulation with use of rollator -- x 262ft; x 14ft - with 3 standing rest breaks lasting <10sec Sit to standing with UE support - x 10 Leg kicks ext with B Ue support - x 20 YTB Heel raises in standing with UE support - x 20  Marches in standing with B UE support - x 10 Step ups onto 4" step - x 5 B   Peformed to improve functional strength     PT Education - 05/23/20 1019    Education Details form/technique with exercise  Person(s) Educated Patient    Methods Explanation;Demonstration    Comprehension Verbalized understanding;Returned demonstration               PT Long Term Goals - 03/01/20 0919      PT LONG TERM GOAL #1   Title Patient will be independent with HEP to continue benefits of therapy after discharge.    Baseline Dependent with exercise progression; 10/14/2019: not currently performing exercises; 12/22/2019: States she'll begin walking with the change of the weather    Time 6    Period Weeks    Status On-going      PT LONG TERM GOAL #2   Title Patient will improve TUG to under 12 seconds to indicate signficant improvement in fall risk.     Baseline TUG: 16 seconds; 10/14/2019: 26 sec; 12/22/2019: 21.3 sec; 03/01/20: 18 sec    Time 6    Period Weeks    Status On-going      PT LONG TERM GOAL #3   Title Patient will improve 42minWT to over 316ft to indicate functional improvement in walking tolerance  and greater ability to walk in stores before requiring a sitting rest break    Baseline 170ft stopped test at 55sec secondary to fatigue; 10/14/2019: 182ft in 80 sec; 12/22/2019: 287ft 106sec; 03/01/20: 248ft 120 sec    Time 6    Period Weeks    Status On-going      PT LONG TERM GOAL #4   Title Patient will demonstrate ability to stand for over 2 min to allow for improved ability to perform household chores at home.     Baseline able to stand for 21 sec; 10/14/2019: 30sec; 12/22/2019: 30 sec; 03/01/20: 100 sec    Time 6    Period Weeks    Status On-going      PT LONG TERM GOAL #5   Title Pt will decrease 5TSTS to <12 seconds from a chair at 19in in height in order to demonstrate clinically significant improvement in LE strength    Baseline 5xSTS: 16 sec from 21" height chair with UE support; 10/14/2019: 14.5 sec 22"; 12/22/2019: with B UE support, 24.7 w/o UE support and PT assistance; 03/01/20: 18sec from roll- aider (21in)    Time 6    Period Weeks    Status On-going                 Plan - 05/23/20 1029    Clinical Impression Statement Continued to focus on improve functional LE strength throughout the session today. Patient with improvement of ambulation as she was able to amb for a total of 467ft compared to 275ft the previous session. Patient continues to have decreased endurance and will benefit from further skilled therapy to return to prior level of function.    Personal Factors and Comorbidities Comorbidity 3+;Fitness    Comorbidities Chronic LBP, spinal stenosis, anal prolapse, HTN    Examination-Activity Limitations Bathing;Lift;Carry;Stand;Stairs;Squat;Sit;Transfers    Examination-Participation Restrictions Cleaning;Community Activity    Stability/Clinical Decision Making Evolving/Moderate complexity    Rehab Potential Fair    PT Frequency 2x / week    PT Duration 6 weeks    PT Treatment/Interventions Electrical Stimulation;Cryotherapy;Moist Heat;Ultrasound;Gait  training;Stair training;Functional mobility training;Neuromuscular re-education;Balance training;Therapeutic exercise;Therapeutic activities;Patient/family education;Manual techniques;Passive range of motion;Energy conservation;Wheelchair mobility training    PT Next Visit Plan endurance and strengthening exercise    PT Home Exercise Plan See education section    Consulted and Agree with Plan of Care Patient;Family member/caregiver  Family Member Consulted Daughter           Patient will benefit from skilled therapeutic intervention in order to improve the following deficits and impairments:  Abnormal gait, Pain, Improper body mechanics, Decreased mobility, Decreased coordination, Increased muscle spasms, Postural dysfunction, Hypermobility, Decreased activity tolerance, Decreased endurance, Decreased range of motion, Decreased strength, Hypomobility, Decreased balance, Decreased safety awareness, Difficulty walking  Visit Diagnosis: History of falling  Difficulty in walking, not elsewhere classified  Muscle weakness (generalized)     Problem List Patient Active Problem List   Diagnosis Date Noted  . B12 deficiency 10/17/2019  . Folate deficiency 10/17/2019  . Anemia 08/29/2019  . Iron deficiency anemia 08/26/2019  . Chronic anticoagulation 10/25/2018  . CKD stage G3b/A1, GFR 30-44 and albumin creatinine ratio <30 mg/g ([place]) 10/05/2018  . MGUS (monoclonal gammopathy of unknown significance) 07/23/2018  . Osteopenia after menopause 07/16/2018  . Diabetes mellitus with no complication ([place]) 83/03/4075  . Vitamin D deficiency 04/07/2018  . Heart palpitations 01/06/2018  . Tachycardia 01/06/2018  . Cystocele with rectocele 10/30/2017  . Rectocele 10/30/2017  . Microcytic red blood cells 09/23/2017  . Localized edema 02/13/2017  . Hemorrhoids 05/17/2016  . Obesity (BMI 30-39.9) 04/26/2016  . Multiple lung nodules 04/19/2016  . Chest pain 04/17/2016  . Partial symptomatic  epilepsy with complex partial seizures, not intractable, without status epilepticus ([place]) 06/30/2015  . Bronchiectasis without complication ([place]) 80/88/1103  . Imbalance 11/08/2014  . Essential (primary) hypertension 11/08/2014  . Personal history of malignant neoplasm of breast 11/08/2014  . History of malignant carcinoid tumor of bronchus and lung 11/08/2014  . H/O peptic ulcer 11/08/2014  . Asthma, mild intermittent 11/08/2014  . Pulmonary embolism ([place]) 11/08/2014  . Gonalgia 11/08/2014  . Leg varices 11/08/2014    Blythe Stanford, PT DPT 05/23/2020, 10:39 AM  [facility] [address] [place], [address] Phone: [phone]   Fax:  [phone]  Name: LORILYN LAITINEN MRN: [id number] Date of Birth: May 06, 1925

## 2020-05-30 ENCOUNTER — Ambulatory Visit: Payer: Medicare Other

## 2020-06-02 ENCOUNTER — Inpatient Hospital Stay: Payer: Medicare Other | Attending: Hematology and Oncology

## 2020-06-02 DIAGNOSIS — E538 Deficiency of other specified B group vitamins: Secondary | ICD-10-CM | POA: Diagnosis present

## 2020-06-02 DIAGNOSIS — D509 Iron deficiency anemia, unspecified: Secondary | ICD-10-CM | POA: Diagnosis not present

## 2020-06-02 MED ORDER — CYANOCOBALAMIN 1000 MCG/ML IJ SOLN
1000.0000 ug | Freq: Once | INTRAMUSCULAR | Status: AC
  Administered 2020-06-02: 1000 ug via INTRAMUSCULAR
  Filled 2020-06-02: qty 1

## 2020-06-08 ENCOUNTER — Ambulatory Visit: Payer: Medicare Other

## 2020-06-16 ENCOUNTER — Inpatient Hospital Stay: Payer: Medicare Other

## 2020-06-19 ENCOUNTER — Ambulatory Visit: Payer: Medicare Other

## 2020-06-27 ENCOUNTER — Ambulatory Visit: Payer: Medicare Other | Admitting: Podiatry

## 2020-06-29 ENCOUNTER — Telehealth: Payer: Self-pay | Admitting: Hematology and Oncology

## 2020-06-29 ENCOUNTER — Inpatient Hospital Stay: Payer: Medicare Other

## 2020-07-03 ENCOUNTER — Inpatient Hospital Stay: Payer: Medicare Other

## 2020-07-07 ENCOUNTER — Ambulatory Visit: Payer: Medicare Other | Admitting: Podiatry

## 2020-07-07 ENCOUNTER — Inpatient Hospital Stay: Payer: Medicare Other | Attending: Hematology and Oncology

## 2020-07-07 DIAGNOSIS — E538 Deficiency of other specified B group vitamins: Secondary | ICD-10-CM | POA: Insufficient documentation

## 2020-07-07 DIAGNOSIS — D508 Other iron deficiency anemias: Secondary | ICD-10-CM | POA: Insufficient documentation

## 2020-07-07 NOTE — Telephone Encounter (Signed)
Spoke to daughter due her calling wanting to reschedule appt. Appt. rescheduled

## 2020-07-10 ENCOUNTER — Ambulatory Visit: Payer: Medicare Other

## 2020-07-11 ENCOUNTER — Ambulatory Visit: Payer: Medicare Other

## 2020-07-14 ENCOUNTER — Other Ambulatory Visit: Payer: Medicare Other

## 2020-07-14 ENCOUNTER — Inpatient Hospital Stay: Payer: Medicare Other

## 2020-07-28 ENCOUNTER — Telehealth: Payer: Self-pay | Admitting: Hematology and Oncology

## 2020-07-28 ENCOUNTER — Telehealth: Payer: Self-pay

## 2020-07-28 ENCOUNTER — Ambulatory Visit: Payer: Medicare Other | Admitting: Podiatry

## 2020-07-28 ENCOUNTER — Inpatient Hospital Stay: Payer: Medicare Other

## 2020-07-28 DIAGNOSIS — I2782 Chronic pulmonary embolism: Secondary | ICD-10-CM

## 2020-07-28 DIAGNOSIS — D509 Iron deficiency anemia, unspecified: Secondary | ICD-10-CM

## 2020-07-28 DIAGNOSIS — Z7901 Long term (current) use of anticoagulants: Secondary | ICD-10-CM

## 2020-07-28 DIAGNOSIS — D508 Other iron deficiency anemias: Secondary | ICD-10-CM | POA: Diagnosis present

## 2020-07-28 DIAGNOSIS — Z853 Personal history of malignant neoplasm of breast: Secondary | ICD-10-CM

## 2020-07-28 DIAGNOSIS — Z85110 Personal history of malignant carcinoid tumor of bronchus and lung: Secondary | ICD-10-CM

## 2020-07-28 DIAGNOSIS — E538 Deficiency of other specified B group vitamins: Secondary | ICD-10-CM | POA: Diagnosis not present

## 2020-07-28 LAB — CBC WITH DIFFERENTIAL/PLATELET
Abs Immature Granulocytes: 0.03 10*3/uL (ref 0.00–0.07)
Basophils Absolute: 0 10*3/uL (ref 0.0–0.1)
Basophils Relative: 1 %
Eosinophils Absolute: 0.2 10*3/uL (ref 0.0–0.5)
Eosinophils Relative: 3 %
HCT: 32.6 % — ABNORMAL LOW (ref 36.0–46.0)
Hemoglobin: 10.6 g/dL — ABNORMAL LOW (ref 12.0–15.0)
Immature Granulocytes: 0 %
Lymphocytes Relative: 43 %
Lymphs Abs: 3.3 10*3/uL (ref 0.7–4.0)
MCH: 25.2 pg — ABNORMAL LOW (ref 26.0–34.0)
MCHC: 32.5 g/dL (ref 30.0–36.0)
MCV: 77.4 fL — ABNORMAL LOW (ref 80.0–100.0)
Monocytes Absolute: 0.6 10*3/uL (ref 0.1–1.0)
Monocytes Relative: 8 %
Neutro Abs: 3.4 10*3/uL (ref 1.7–7.7)
Neutrophils Relative %: 45 %
Platelets: 204 10*3/uL (ref 150–400)
RBC: 4.21 MIL/uL (ref 3.87–5.11)
RDW: 15 % (ref 11.5–15.5)
WBC: 7.5 10*3/uL (ref 4.0–10.5)
nRBC: 0 % (ref 0.0–0.2)

## 2020-07-28 LAB — FERRITIN: Ferritin: 33 ng/mL (ref 11–307)

## 2020-07-28 MED ORDER — CYANOCOBALAMIN 1000 MCG/ML IJ SOLN
1000.0000 ug | Freq: Once | INTRAMUSCULAR | Status: AC
  Administered 2020-07-28: 1000 ug via INTRAMUSCULAR
  Filled 2020-07-28: qty 1

## 2020-07-28 NOTE — Telephone Encounter (Signed)
07/28/2020 Called pts daughter and informed her that Dr. Loletha Grayer would like pt to get venofer x3 due to low ferritin and hgb. Appts scheduled in [place], per daughter's request, @ 2 pm on 1/31, 2/2, and 2/7. Daughter confirmed appts  SRW

## 2020-07-28 NOTE — Telephone Encounter (Signed)
I spoke with patient daughter and informed her the the patient Rebecca Wolf was low and that her hemoglobin was drifting down. Per dr.corcoran she would like for patient to consider venofer x3 weekly. Patient daughter stated she is not having any type of bleeding and that they will do Weekly venofer.

## 2020-07-31 ENCOUNTER — Other Ambulatory Visit: Payer: Self-pay

## 2020-07-31 ENCOUNTER — Inpatient Hospital Stay: Payer: Medicare Other

## 2020-07-31 VITALS — BP 126/76 | HR 83 | Temp 97.6°F | Resp 20

## 2020-07-31 DIAGNOSIS — E538 Deficiency of other specified B group vitamins: Secondary | ICD-10-CM | POA: Diagnosis not present

## 2020-07-31 DIAGNOSIS — D509 Iron deficiency anemia, unspecified: Secondary | ICD-10-CM

## 2020-07-31 MED ORDER — IRON SUCROSE 20 MG/ML IV SOLN
200.0000 mg | Freq: Once | INTRAVENOUS | Status: AC
  Administered 2020-07-31: 200 mg via INTRAVENOUS
  Filled 2020-07-31: qty 10

## 2020-07-31 MED ORDER — SODIUM CHLORIDE 0.9 % IV SOLN
INTRAVENOUS | Status: DC
  Filled 2020-07-31: qty 250

## 2020-07-31 NOTE — Progress Notes (Signed)
Stable at discharge 

## 2020-08-02 ENCOUNTER — Inpatient Hospital Stay: Payer: Medicare Other | Attending: Hematology and Oncology

## 2020-08-02 VITALS — BP 109/58 | HR 77 | Temp 97.6°F | Resp 18

## 2020-08-02 DIAGNOSIS — D508 Other iron deficiency anemias: Secondary | ICD-10-CM | POA: Diagnosis present

## 2020-08-02 DIAGNOSIS — E538 Deficiency of other specified B group vitamins: Secondary | ICD-10-CM | POA: Insufficient documentation

## 2020-08-02 DIAGNOSIS — D509 Iron deficiency anemia, unspecified: Secondary | ICD-10-CM

## 2020-08-02 MED ORDER — SODIUM CHLORIDE 0.9 % IV SOLN
Freq: Once | INTRAVENOUS | Status: AC
  Filled 2020-08-02: qty 250

## 2020-08-02 MED ORDER — IRON SUCROSE 20 MG/ML IV SOLN
200.0000 mg | Freq: Once | INTRAVENOUS | Status: AC
Start: 2020-08-02 — End: 2020-08-02
  Administered 2020-08-02: 200 mg via INTRAVENOUS
  Filled 2020-08-02: qty 10

## 2020-08-02 NOTE — Progress Notes (Signed)
Pt tolerated infusion well. No s/s of distress or reaction noted. Pt stable at discharge.  

## 2020-08-07 ENCOUNTER — Inpatient Hospital Stay: Payer: Medicare Other

## 2020-08-11 ENCOUNTER — Inpatient Hospital Stay: Payer: Medicare Other

## 2020-08-17 ENCOUNTER — Inpatient Hospital Stay: Payer: Medicare Other

## 2020-08-17 VITALS — BP 125/67 | HR 84 | Temp 97.7°F | Resp 20

## 2020-08-17 DIAGNOSIS — E538 Deficiency of other specified B group vitamins: Secondary | ICD-10-CM | POA: Diagnosis not present

## 2020-08-17 DIAGNOSIS — D509 Iron deficiency anemia, unspecified: Secondary | ICD-10-CM

## 2020-08-17 MED ORDER — IRON SUCROSE 20 MG/ML IV SOLN
200.0000 mg | Freq: Once | INTRAVENOUS | Status: AC
  Administered 2020-08-17: 200 mg via INTRAVENOUS

## 2020-08-17 MED ORDER — SODIUM CHLORIDE 0.9 % IV SOLN
INTRAVENOUS | Status: DC
  Filled 2020-08-17: qty 250

## 2020-08-17 MED ORDER — IRON SUCROSE 20 MG/ML IV SOLN
INTRAVENOUS | Status: AC
  Filled 2020-08-17: qty 5

## 2020-08-18 ENCOUNTER — Ambulatory Visit: Payer: Medicare Other | Admitting: Podiatry

## 2020-08-25 ENCOUNTER — Inpatient Hospital Stay: Payer: Medicare Other

## 2020-08-28 ENCOUNTER — Inpatient Hospital Stay: Payer: Medicare Other

## 2020-08-28 DIAGNOSIS — D509 Iron deficiency anemia, unspecified: Secondary | ICD-10-CM

## 2020-08-28 DIAGNOSIS — E538 Deficiency of other specified B group vitamins: Secondary | ICD-10-CM | POA: Diagnosis not present

## 2020-08-28 MED ORDER — CYANOCOBALAMIN 1000 MCG/ML IJ SOLN
1000.0000 ug | Freq: Once | INTRAMUSCULAR | Status: AC
  Administered 2020-08-28: 1000 ug via INTRAMUSCULAR
  Filled 2020-08-28: qty 1

## 2020-09-08 ENCOUNTER — Inpatient Hospital Stay: Payer: Medicare Other

## 2020-09-08 ENCOUNTER — Ambulatory Visit: Payer: Medicare Other | Admitting: Podiatry

## 2020-09-12 ENCOUNTER — Ambulatory Visit: Payer: Medicare Other | Admitting: Podiatry

## 2020-09-13 ENCOUNTER — Ambulatory Visit: Payer: Medicare Other

## 2020-09-19 ENCOUNTER — Other Ambulatory Visit: Payer: Self-pay

## 2020-09-19 ENCOUNTER — Ambulatory Visit (INDEPENDENT_AMBULATORY_CARE_PROVIDER_SITE_OTHER): Payer: Medicare Other | Admitting: Podiatry

## 2020-09-19 DIAGNOSIS — M79676 Pain in unspecified toe(s): Secondary | ICD-10-CM

## 2020-09-19 DIAGNOSIS — R600 Localized edema: Secondary | ICD-10-CM

## 2020-09-19 DIAGNOSIS — L989 Disorder of the skin and subcutaneous tissue, unspecified: Secondary | ICD-10-CM | POA: Diagnosis not present

## 2020-09-19 DIAGNOSIS — B351 Tinea unguium: Secondary | ICD-10-CM | POA: Diagnosis not present

## 2020-09-19 NOTE — Progress Notes (Signed)
   HPI: 87 y.o. female presenting today for possible fitting of diabetic shoes.  Patient states that due to the swelling in her legs she is unable to get most shoes on having a hard time getting them on her feet.  Patient does have a history of diabetes mellitus and she comes in for routine foot care.  Currently no other complaints at this time  Past Medical History:  Diagnosis Date  . Asthma   . Breast cancer ([place]) 2003   LT LUMPECTOMY  . Breast cancer ([place]) 2004   LT LUMPECTOMY  . 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  . GERD (gastroesophageal reflux disease)   . Hemorrhoids   . Hypertension   . Personal history of chemotherapy 2004   BREAST CA  . Personal history of radiation therapy 2003   BREAST CA  . Personal history of radiation therapy 2004   BREAST CA  . Polyp of colon 2002   bleeding polyp of right ascending colon  . PUD (peptic ulcer disease)   . Seizures ([place])    epilepsy well controlled     Physical Exam: General: The patient is alert and oriented x3 in no acute distress.  Dermatology: Skin is warm, dry and supple bilateral lower extremities. Negative for open lesions or macerations.  Hyperkeratotic dystrophic nails noted 1-5 bilateral  Vascular: Palpable pedal pulses bilaterally. No edema or erythema noted. Capillary refill within normal limits.  Neurological: Epicritic and protective threshold diminished bilaterally.   Musculoskeletal Exam: Range of motion within normal limits to all pedal and ankle joints bilateral. Muscle strength 5/5 in all groups bilateral.   Assessment: 1.  Diabetes mellitus with peripheral polyneuropathy 2.  Pain due to onychomycosis of toenail both   Plan of Care:  1. Patient evaluated.  2.  Appointment with Pedorthist for diabetic shoes and insoles made today.  Due to the heavy edema of the lower extremities she would benefit from accommodative diabetic shoes and insoles 3.  Return to clinic next scheduled appointment  for routine foot care      Edrick Kins, DPM [facility]  Dr. Edrick Kins, DPM    [address], [place] [address]                Office [phone]  Fax [phone]

## 2020-09-21 ENCOUNTER — Ambulatory Visit: Payer: Medicare Other

## 2020-09-22 ENCOUNTER — Inpatient Hospital Stay: Payer: Medicare Other

## 2020-09-26 ENCOUNTER — Ambulatory Visit: Payer: Medicare Other

## 2020-09-29 ENCOUNTER — Inpatient Hospital Stay: Payer: Medicare Other | Attending: Hematology and Oncology

## 2020-09-29 ENCOUNTER — Other Ambulatory Visit: Payer: Self-pay

## 2020-09-29 DIAGNOSIS — E538 Deficiency of other specified B group vitamins: Secondary | ICD-10-CM | POA: Diagnosis not present

## 2020-09-29 DIAGNOSIS — D509 Iron deficiency anemia, unspecified: Secondary | ICD-10-CM | POA: Insufficient documentation

## 2020-09-29 MED ORDER — CYANOCOBALAMIN 1000 MCG/ML IJ SOLN
1000.0000 ug | Freq: Once | INTRAMUSCULAR | Status: AC
  Administered 2020-09-29: 1000 ug via INTRAMUSCULAR
  Filled 2020-09-29: qty 1

## 2020-10-02 ENCOUNTER — Ambulatory Visit: Payer: Medicare Other | Attending: Internal Medicine

## 2020-10-02 ENCOUNTER — Other Ambulatory Visit: Payer: Self-pay

## 2020-10-02 DIAGNOSIS — M6281 Muscle weakness (generalized): Secondary | ICD-10-CM | POA: Insufficient documentation

## 2020-10-02 DIAGNOSIS — R262 Difficulty in walking, not elsewhere classified: Secondary | ICD-10-CM | POA: Diagnosis present

## 2020-10-02 DIAGNOSIS — Z9181 History of falling: Secondary | ICD-10-CM | POA: Diagnosis present

## 2020-10-02 NOTE — Therapy (Signed)
[facility] [address], [place], [address] Phone: [phone]   Fax:  [phone]  Physical Therapy Evaluation  Patient Details  Name: Rebecca Wolf MRN: [id number] Date of Birth: 08/27/1924 Referring Provider (PT): Mike Gip MD   Encounter Date: 10/02/2020   PT End of Session - 10/02/20 1540    Visit Number 1    Number of Visits 17    Date for PT Re-Evaluation 12/25/20    Authorization Type 1 / 10    PT Start Time 1120    PT Stop Time 1200    PT Time Calculation (min) 40 min    Equipment Utilized During Treatment Gait belt    Activity Tolerance Patient tolerated treatment well;Patient limited by fatigue    Behavior During Therapy [facility] for tasks assessed/performed           Past Medical History:  Diagnosis Date  . Asthma   . Breast cancer ([place]) 2003   LT LUMPECTOMY  . Breast cancer ([place]) 2004   LT LUMPECTOMY  . 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  . GERD (gastroesophageal reflux disease)   . Hemorrhoids   . Hypertension   . Personal history of chemotherapy 2004   BREAST CA  . Personal history of radiation therapy 2003   BREAST CA  . Personal history of radiation therapy 2004   BREAST CA  . Polyp of colon 2002   bleeding polyp of right ascending colon  . PUD (peptic ulcer disease)   . Seizures ([place])    epilepsy well controlled    Past Surgical History:  Procedure Laterality Date  . BREAST EXCISIONAL BIOPSY Left 2003   positive  . BREAST EXCISIONAL BIOPSY Left 2004   positive  . BREAST LUMPECTOMY Left 2003   BREAST CA  . BREAST LUMPECTOMY Left 2004   BREAST CA  . HEMICOLECTOMY Right    bowel obstruction  . HEMORRHOID SURGERY    . THORACOTOMY/LOBECTOMY  1999   carcinoid tumor  . TOTAL VAGINAL HYSTERECTOMY    . VARICOSE VEIN SURGERY      There were no vitals filed for this visit.    Subjective Assessment - 10/02/20 1536    Subjective Patient reports she's been  doing "pretty good". However, patient reports her largest concern surround the decrease in strength, difficulty in walking, and difficulty transferring from sitting to standing most notably off the couch. Patient states increased difficulty with ascending/descending stairs, standing for prolonged periods of time, and walking for a period of time > 1 min. Patient reports that  she has been sitting on the couch all day. Patient states she uses her rollator to ambulate. Patient states these limitations are affecting her ability to perform functional tasks around the home and does not prepare food, clean, and has difficulty dressing secondary to these concerns. Increased bowel problems for the past two months with loose stool ~1 x day. For pleasure, patient Rides in the car through [place]. Patient states she Has not been walking for exercise recently but wants to wait until the weather gets warmer. Patient states she needs to move by the end of the month as her lease has ended at her current resident.    Pertinent History Anal Prolapse, Spinal stenosis, HTN,    Limitations Lifting;Sitting;Standing;Walking;House hold activities    How long can you stand comfortably? <73min    How long can you walk comfortably? 2 min with Rollator    Patient  Stated Goals To move easier    Currently in Pain? No/denies    Pain Onset More than a month ago              [facility] PT Assessment - 10/02/20 1552      Assessment   Medical Diagnosis Difficulty in walking    Referring Provider (PT) Corcoran MD    Onset Date/Surgical Date 07/01/16    Hand Dominance Right    Next MD Visit Unknown    Prior Therapy Yes- balance      Restrictions   Weight Bearing Restrictions No      Balance Screen   Has the patient fallen in the past 6 months No    Has the patient had a decrease in activity level because of a fear of falling?  No    Is the patient reluctant to leave their home because of a fear of falling?  No      Home  Environment   Living Environment Private residence    Living Arrangements Children    Available Help at Discharge Family    Type of [place] to enter    Entrance Stairs-Number of Steps 3    Entrance Stairs-Rails Can reach both    [place] to live on main level with bedroom/bathroom    Home Equipment Grab bars - toilet      Prior Function   Level of Independence Independent    Vocation Retired    Biomedical scientist N/A    Leisure Watching TV, walking      Cognition   Overall Cognitive Status Within Functional Limits for tasks assessed      Observation/Other Assessments   Observations Increased lumbar flexion in sitting      Sensation   Light Touch Appears Intact      Functional Tests   Functional tests Sit to Stand      Sit to Stand   Comments Requires use of UE, momentum to stand      Posture/Postural Control   Posture/Postural Control Postural limitations    Postural Limitations Anterior pelvic tilt      ROM / Strength   AROM / PROM / Strength AROM;Strength      AROM   Right Hip Flexion 90    Right Hip External Rotation  15    Right Hip Internal Rotation  15    Right Hip ABduction 25    Right Hip ADduction 5    Left Hip Flexion 90    Left Hip External Rotation  20    Left Hip Internal Rotation  15    Left Hip ABduction 20    Left Hip ADduction 5    Right/Left Knee Right;Left    Right Knee Extension 0    Right Knee Flexion 90    Left Knee Extension 0    Left Knee Flexion 90      Strength   Right Hip Flexion 4/5    Right Hip Extension 3+/5    Right Hip External Rotation  3/5    Right Hip Internal Rotation 3/5    Right Hip ABduction 3/5    Right Hip ADduction 4/5    Left Hip Flexion 4/5    Left Hip Extension 3+/5    Left Hip External Rotation 3/5    Left Hip Internal Rotation 3/5    Left Hip ABduction 3/5    Left Hip ADduction 3/5    Right/Left Knee  Right;Left    Right Knee Flexion 3+/5    Right Knee Extension  4+/5    Left Knee Flexion 4-/5    Left Knee Extension 4+/5      Transfers   Five time sit to stand comments  : 21" with use of UEs 19.8 sec      Ambulation/Gait   Gait Comments Decreased step length, walks with rollator at a distance far from the walker, lumbar flexion while walking, heavy use of UEs      Standardized Balance Assessment   Standardized Balance Assessment Timed Up and Go Test      Timed Up and Go Test   Normal TUG (seconds) 29    TUG Comments rollator used          40minWT: 51 sec; 67 ft walked  Objective measurements completed on examination: See above findings.       PT Education - 10/02/20 1538    Education Details form/technique with exercise    Person(s) Educated Patient    Methods Explanation;Demonstration    Comprehension Verbalized understanding;Returned demonstration               PT Long Term Goals - 03/01/20 0919      PT LONG TERM GOAL #1   Title Patient will be independent with HEP to continue benefits of therapy after discharge.    Baseline Dependent with exercise progression; 10/14/2019: not currently performing exercises; 12/22/2019: States she'll begin walking with the change of the weather    Time 6    Period Weeks    Status On-going      PT LONG TERM GOAL #2   Title Patient will improve TUG to under 12 seconds to indicate signficant improvement in fall risk.     Baseline TUG: 16 seconds; 10/14/2019: 26 sec; 12/22/2019: 21.3 sec; 03/01/20: 18 sec    Time 6    Period Weeks    Status On-going      PT LONG TERM GOAL #3   Title Patient will improve 53minWT to over 342ft to indicate functional improvement in walking tolerance and greater ability to walk in stores before requiring a sitting rest break    Baseline 152ft stopped test at 55sec secondary to fatigue; 10/14/2019: 165ft in 80 sec; 12/22/2019: 29ft 106sec; 03/01/20: 280ft 120 sec    Time 6    Period Weeks    Status On-going      PT LONG TERM GOAL #4   Title Patient will  demonstrate ability to stand for over 2 min to allow for improved ability to perform household chores at home.     Baseline able to stand for 21 sec; 10/14/2019: 30sec; 12/22/2019: 30 sec; 03/01/20: 100 sec    Time 6    Period Weeks    Status On-going      PT LONG TERM GOAL #5   Title Pt will decrease 5TSTS to <12 seconds from a chair at 19in in height in order to demonstrate clinically significant improvement in LE strength    Baseline 5xSTS: 16 sec from 21" height chair with UE support; 10/14/2019: 14.5 sec 22"; 12/22/2019: with B UE support, 24.7 w/o UE support and PT assistance; 03/01/20: 18sec from roll- aider (21in)    Time 6    Period Weeks    Status On-going                  Plan - 10/02/20 1543    Clinical Impression Statement Patient is a 87 yo  right had dominant female presenting with increased fall risk, decreased generalized strength, and poor overall balance in both static and dynamic positions. Patient's current limitations are indicated by poor MMT scores of the LEs/Hips B as well as decreased TUG, 43minWT, 64mWT, and 5xSTS tests. Out of these, the most significant limitation is the 63minWT as she was able to perform 51sec for 67 ft. Patient will benefit from further skilled therapy focused on improving functional LE strength and static/dynamic balance to improve functional capacity.    Personal Factors and Comorbidities Comorbidity 3+;Fitness    Comorbidities Chronic LBP, spinal stenosis, anal prolapse, HTN    Examination-Activity Limitations Bathing;Lift;Carry;Stand;Stairs;Squat;Sit;Transfers    Examination-Participation Restrictions Cleaning;Community Activity    Stability/Clinical Decision Making Evolving/Moderate complexity    Clinical Decision Making Moderate    Rehab Potential Fair    PT Frequency 2x / week    PT Duration 6 weeks    PT Treatment/Interventions Electrical Stimulation;Cryotherapy;Moist Heat;Ultrasound;Gait training;Stair training;Functional mobility  training;Neuromuscular re-education;Balance training;Therapeutic exercise;Therapeutic activities;Patient/family education;Manual techniques;Passive range of motion;Energy conservation;Wheelchair mobility training    PT Next Visit Plan endurance and strengthening exercise    PT Home Exercise Plan See education section    Consulted and Agree with Plan of Care Patient;Family member/caregiver    Family Member Consulted Daughter           Patient will benefit from skilled therapeutic intervention in order to improve the following deficits and impairments:  Abnormal gait,Pain,Improper body mechanics,Decreased mobility,Decreased coordination,Increased muscle spasms,Postural dysfunction,Hypermobility,Decreased activity tolerance,Decreased endurance,Decreased range of motion,Decreased strength,Hypomobility,Decreased balance,Decreased safety awareness,Difficulty walking  Visit Diagnosis: History of falling  Difficulty in walking, not elsewhere classified  Muscle weakness (generalized)     Problem List Patient Active Problem List   Diagnosis Date Noted  . B12 deficiency 10/17/2019  . Folate deficiency 10/17/2019  . Anemia 08/29/2019  . Iron deficiency anemia 08/26/2019  . Chronic anticoagulation 10/25/2018  . CKD stage G3b/A1, GFR 30-44 and albumin creatinine ratio <30 mg/g ([place]) 10/05/2018  . MGUS (monoclonal gammopathy of unknown significance) 07/23/2018  . Osteopenia after menopause 07/16/2018  . Diabetes mellitus with no complication ([place]) 08/67/6195  . Vitamin D deficiency 04/07/2018  . Heart palpitations 01/06/2018  . Tachycardia 01/06/2018  . Cystocele with rectocele 10/30/2017  . Rectocele 10/30/2017  . Microcytic red blood cells 09/23/2017  . Localized edema 02/13/2017  . Hemorrhoids 05/17/2016  . Obesity (BMI 30-39.9) 04/26/2016  . Multiple lung nodules 04/19/2016  . Chest pain 04/17/2016  . Partial symptomatic epilepsy with complex partial seizures, not intractable,  without status epilepticus ([place]) 06/30/2015  . Bronchiectasis without complication ([place]) 09/32/6712  . Imbalance 11/08/2014  . Essential (primary) hypertension 11/08/2014  . Personal history of malignant neoplasm of breast 11/08/2014  . History of malignant carcinoid tumor of bronchus and lung 11/08/2014  . H/O peptic ulcer 11/08/2014  . Asthma, mild intermittent 11/08/2014  . Pulmonary embolism ([place]) 11/08/2014  . Gonalgia 11/08/2014  . Leg varices 11/08/2014    Blythe Stanford, PT DPT 10/02/2020, 4:13 PM  [facility] [address] [place], [address] Phone: [phone]   Fax:  [phone]  Name: AZALIE HARBECK MRN: [id number] Date of Birth: [birth year]

## 2020-10-10 ENCOUNTER — Ambulatory Visit: Payer: Medicare Other

## 2020-10-11 ENCOUNTER — Other Ambulatory Visit: Payer: Self-pay

## 2020-10-11 ENCOUNTER — Ambulatory Visit: Payer: Medicare Other

## 2020-10-13 ENCOUNTER — Ambulatory Visit: Admit: 2020-10-13 | Payer: Medicare Other | Admitting: Ophthalmology

## 2020-10-13 SURGERY — PHACOEMULSIFICATION, CATARACT, WITH IOL INSERTION
Anesthesia: Topical | Laterality: Right

## 2020-10-16 ENCOUNTER — Telehealth: Payer: Self-pay | Admitting: Podiatry

## 2020-10-16 ENCOUNTER — Ambulatory Visit: Payer: Medicare Other

## 2020-10-16 NOTE — Telephone Encounter (Addendum)
pts daughter returned call and lvm and I called her back and she maybe just hold off on them since she is going to buy her mom some sandals for the summer. I have sent a few pictures of shoes for her to look at incase she decides to order. The daughter is to let me know.  Pts daughter called back and they want to get the blue  naples [employer]ofeet shoes([phone])

## 2020-10-16 NOTE — Telephone Encounter (Signed)
Left message for pts daughter that the shoes pt picked out at her appt last week are not available (1 is width and 1 not available at all) and to call me to discuss further.

## 2020-10-18 ENCOUNTER — Other Ambulatory Visit: Payer: Self-pay

## 2020-10-18 ENCOUNTER — Inpatient Hospital Stay: Payer: Medicare Other

## 2020-10-18 DIAGNOSIS — Z853 Personal history of malignant neoplasm of breast: Secondary | ICD-10-CM

## 2020-10-18 DIAGNOSIS — D509 Iron deficiency anemia, unspecified: Secondary | ICD-10-CM

## 2020-10-18 DIAGNOSIS — E538 Deficiency of other specified B group vitamins: Secondary | ICD-10-CM

## 2020-10-18 DIAGNOSIS — Z85110 Personal history of malignant carcinoid tumor of bronchus and lung: Secondary | ICD-10-CM

## 2020-10-18 DIAGNOSIS — I2782 Chronic pulmonary embolism: Secondary | ICD-10-CM

## 2020-10-18 DIAGNOSIS — Z7901 Long term (current) use of anticoagulants: Secondary | ICD-10-CM

## 2020-10-18 LAB — FERRITIN: Ferritin: 131 ng/mL (ref 11–307)

## 2020-10-18 LAB — CBC WITH DIFFERENTIAL/PLATELET
Abs Immature Granulocytes: 0.02 10*3/uL (ref 0.00–0.07)
Basophils Absolute: 0 10*3/uL (ref 0.0–0.1)
Basophils Relative: 0 %
Eosinophils Absolute: 0.2 10*3/uL (ref 0.0–0.5)
Eosinophils Relative: 3 %
HCT: 34.5 % — ABNORMAL LOW (ref 36.0–46.0)
Hemoglobin: 10.9 g/dL — ABNORMAL LOW (ref 12.0–15.0)
Immature Granulocytes: 0 %
Lymphocytes Relative: 47 %
Lymphs Abs: 2.7 10*3/uL (ref 0.7–4.0)
MCH: 25.3 pg — ABNORMAL LOW (ref 26.0–34.0)
MCHC: 31.6 g/dL (ref 30.0–36.0)
MCV: 80 fL (ref 80.0–100.0)
Monocytes Absolute: 0.5 10*3/uL (ref 0.1–1.0)
Monocytes Relative: 8 %
Neutro Abs: 2.4 10*3/uL (ref 1.7–7.7)
Neutrophils Relative %: 42 %
Platelets: 236 10*3/uL (ref 150–400)
RBC: 4.31 MIL/uL (ref 3.87–5.11)
RDW: 15.5 % (ref 11.5–15.5)
WBC: 5.8 10*3/uL (ref 4.0–10.5)
nRBC: 0 % (ref 0.0–0.2)

## 2020-10-18 LAB — IRON AND TIBC
Iron: 57 ug/dL (ref 28–170)
Saturation Ratios: 16 % (ref 10.4–31.8)
TIBC: 354 ug/dL (ref 250–450)
UIBC: 297 ug/dL

## 2020-10-18 NOTE — Progress Notes (Signed)
[facility]  [address] [place], [place] [address] Phone: [phone]  Fax: [phone]   Telemedicine Office Visit:  10/19/2020  Referring physician: Lequita Asal, MD  I connected with Rebecca Wolf on 10/19/2020 at 2:04 PM by videoconferencing and verified that I was speaking with the correct person using 2 identifiers.  The patient was at home.  I discussed the limitations, risk, security and privacy concerns of performing an evaluation and management service by videoconferencing and the availability of in person appointments.  I also discussed with the patient that there may be a patient responsible charge related to this service.  The patient expressed understanding and agreed to proceed.   Chief Complaint: Rebecca Wolf is a 87 y.o. female  with a history of breast cancer, carcinoid tumor, and DVT/PE who is seen for 6 month assessment.   HPI: The patient was last seen in the medical oncology clinic on 04/11/2020 via telemedicine. At that time, she felt fine. She was still taking Eliquis. She denied any breast concerns.  Diarrhea had improved. Hematocrit was 36.2, hemoglobin 11.8, MCV 77.0, platelets 271,000, WBC 8,500. Ferritin was 23 with an iron saturation of 11% and a TIBC of 384.  The patient received Venofer on 04/20/2020 and 04/27/2020.  Labs followed: 06/07/2020: Hematocrit 34.0, hemoglobin 10.9, MCV 77.6, platelets 201,000, WBC 6,900. Ferritin 76.2. 07/29/2019: Hematocrit 32.6, hemoglobin 10.6, MCV 77.4, platelets 204,000, WBC 7,500. Ferritin 33.0.  She received Venofer on 07/31/2020, 08/02/2020, and 08/17/2020. She received vitamin B12 injections monthly x 5 (04/20/2020 - 09/29/2020).  During the interim, she has felt good. She has been struggling with diarrhea. She has been following the [employer], which has helped. She has not had diarrhea in 3 days. She has [place] and plans to start using it if necessary.  She has rare  elbow pain. She still has hand and toe numbness. The skin on her hands feel "grainy." She has multiple spots on her skin and is followed by dermatology. She denies any breast concerns.  She has not been taking folic acid. She is on Eliquis.    Past Medical History:  Diagnosis Date  . Asthma   . Breast cancer ([place]) 2003   LT LUMPECTOMY  . Breast cancer ([place]) 2004   LT LUMPECTOMY  . 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  . GERD (gastroesophageal reflux disease)   . Hemorrhoids   . Hypertension   . Personal history of chemotherapy 2004   BREAST CA  . Personal history of radiation therapy 2003   BREAST CA  . Personal history of radiation therapy 2004   BREAST CA  . Polyp of colon 2002   bleeding polyp of right ascending colon  . PUD (peptic ulcer disease)   . Seizures ([place])    epilepsy well controlled    Past Surgical History:  Procedure Laterality Date  . BREAST EXCISIONAL BIOPSY Left 2003   positive  . BREAST EXCISIONAL BIOPSY Left 2004   positive  . BREAST LUMPECTOMY Left 2003   BREAST CA  . BREAST LUMPECTOMY Left 2004   BREAST CA  . HEMICOLECTOMY Right    bowel obstruction  . HEMORRHOID SURGERY    . THORACOTOMY/LOBECTOMY  1999   carcinoid tumor  . TOTAL VAGINAL HYSTERECTOMY    . VARICOSE VEIN SURGERY      Family History  Problem Relation Age of Onset  . Alcoholism Father   . Diabetes Sister   . Breast cancer  Neg Hx     Social History:  reports that she has quit smoking. She has never used smokeless tobacco. She reports that she does not drink alcohol and does not use drugs. She has a daughter named Rebecca Wolf. The patient is accompanied by her daughter, Rebecca Wolf, today.  Participants in the patient's visit and their role in the encounter included the patient and Duwayne Heck CMA, today.  The intake visit was provided by Duwayne Heck, [place].  Allergies:  Allergies  Allergen Reactions  . Levofloxacin Shortness Of Breath  . Sulfa Antibiotics Rash     Current Medications: Current Outpatient Medications  Medication Sig Dispense Refill  . acetaminophen (TYLENOL) 500 MG tablet Take 500 mg by mouth every 6 (six) hours as needed.    Marland Kitchen albuterol (ACCUNEB) 1.25 MG/3ML nebulizer solution Inhale 1 ampule into the lungs 4 (four) times daily as needed.    Marland Kitchen amLODipine (NORVASC) 5 MG tablet TAKE 1 TABLET BY MOUTH EVERY DAY (Patient taking differently: Take 2.5 mg by mouth daily.) 90 tablet 0  . azelastine (ASTELIN) 0.1 % nasal spray U 1 SPRAY IEN BID TO REPLACE PATANASE    . azithromycin (ZITHROMAX) 250 MG tablet Take 250 mg by mouth daily.    . budesonide (PULMICORT) 0.5 MG/2ML nebulizer solution Inhale 0.5 mg into the lungs 2 (two) times daily as needed.    . diltiazem (CARDIZEM LA) 180 MG 24 hr tablet Take 180 mg by mouth daily.    Marland Kitchen ELIQUIS 2.5 MG TABS tablet TAKE 1 TABLET BY MOUTH TWICE DAILY 60 tablet 5  . fluticasone (FLONASE) 50 MCG/ACT nasal spray Place 2 sprays into the nose daily.    . furosemide (LASIX) 20 MG tablet every other day.    . levalbuterol (XOPENEX) 0.63 MG/3ML nebulizer solution VVN Q 4 H PRN FOR WHZ    . levETIRAcetam (KEPPRA) 750 MG tablet Take 1 tablet by mouth 2 (two) times daily.    . montelukast (SINGULAIR) 10 MG tablet Take 1 tablet by mouth daily.    . pantoprazole (PROTONIX) 40 MG tablet TAKE 1 TABLET BY MOUTH TWICE DAILY (Patient taking differently: daily.) 60 tablet 0  . prednisoLONE acetate (PRED FORTE) 1 % ophthalmic suspension INTILL 2 GTS INTO OU QID  0  . predniSONE (DELTASONE) 2.5 MG tablet Take 2.5 mg by mouth daily with breakfast.    . spironolactone (ALDACTONE) 25 MG tablet Take 1 tablet by mouth daily.    . sucralfate (CARAFATE) 1 g tablet Take 1 tablet (1 g total) by mouth 4 (four) times daily. 352 tablet 2  . Cholecalciferol 25 MCG (1000 UT) tablet Take 1,000 Units by mouth daily.  (Patient not taking: Reported on 10/19/2020)    . COVID-19 mRNA vaccine, Pfizer, 30 MCG/0.3ML injection USE AS DIRECTED  (Patient not taking: Reported on 10/19/2020) .3 mL 0  . Fluticasone-Salmeterol (ADVAIR) 500-50 MCG/DOSE AEPB Inhale 1 puff into the lungs 2 (two) times daily. (Patient not taking: No sig reported)    . Olopatadine HCl 0.6 % SOLN Place 1 puff into the nose 2 (two) times daily. (Patient not taking: No sig reported)    . spironolactone-hydrochlorothiazide (ALDACTAZIDE) 25-25 MG tablet TAKE 1 TABLET BY MOUTH DAILY (Patient not taking: Reported on 10/19/2020) 90 tablet 0   No current facility-administered medications for this visit.    Review of Systems  Constitutional: Negative for chills, diaphoresis, fever and malaise/fatigue. Weight loss: no new weight.  HENT: Positive for hearing loss (left ear). Negative for congestion, ear discharge,  ear pain, nosebleeds, sinus pain, sore throat and tinnitus.   Eyes: Negative.  Negative for blurred vision.  Respiratory: Negative for cough, hemoptysis, sputum production and shortness of breath.   Cardiovascular: Negative for chest pain, palpitations and leg swelling.  Gastrointestinal: Positive for diarrhea. Negative for abdominal pain, blood in stool, constipation, heartburn, melena, nausea and vomiting.  Genitourinary: Negative.  Negative for dysuria, frequency, hematuria and urgency.  Musculoskeletal: Positive for joint pain (elbows, rare). Negative for back pain, myalgias and neck pain.  Skin: Negative for itching and rash.       Multiple spots on skin. Skin on hands is "grainy."  Neurological: Positive for sensory change (numbness in hands and feet). Negative for dizziness, tingling, speech change, focal weakness, seizures, weakness and headaches.  Endo/Heme/Allergies: Does not bruise/bleed easily.  Psychiatric/Behavioral: Negative.  Negative for depression and memory loss. The patient is not nervous/anxious and does not have insomnia.   All other systems reviewed and are negative.   Performance status (ECOG):  2  Physical Exam Vitals and nursing note  reviewed.  Constitutional:      General: She is not in acute distress.    Appearance: She is not diaphoretic.  HENT:     Head:     Comments: Gray hair pulled back. Eyes:     General: No scleral icterus.    Conjunctiva/sclera: Conjunctivae normal.  Neurological:     Mental Status: She is alert and oriented to person, place, and time.  Psychiatric:        Behavior: Behavior normal.        Thought Content: Thought content normal.        Judgment: Judgment normal.    Appointment on 10/18/2020  Component Date Value Ref Range Status  . Iron 10/18/2020 57  28 - 170 ug/dL Final  . TIBC 10/18/2020 354  250 - 450 ug/dL Final  . Saturation Ratios 10/18/2020 16  10.4 - 31.8 % Final  . UIBC 10/18/2020 297  ug/dL Final   Performed at [facility], [address], [place], [place] [address]  . Ferritin 10/18/2020 131  11 - 307 ng/mL Final   Performed at [facility], [address] [place], [place] [address]  . WBC 10/18/2020 5.8  4.0 - 10.5 K/uL Final  . RBC 10/18/2020 4.31  3.87 - 5.11 MIL/uL Final  . Hemoglobin 10/18/2020 10.9* 12.0 - 15.0 g/dL Final  . HCT 10/18/2020 34.5* 36.0 - 46.0 % Final  . MCV 10/18/2020 80.0  80.0 - 100.0 fL Final  . MCH 10/18/2020 25.3* 26.0 - 34.0 pg Final  . MCHC 10/18/2020 31.6  30.0 - 36.0 g/dL Final  . RDW 10/18/2020 15.5  11.5 - 15.5 % Final  . Platelets 10/18/2020 236  150 - 400 K/uL Final  . nRBC 10/18/2020 0.0  0.0 - 0.2 % Final  . Neutrophils Relative % 10/18/2020 42  % Final  . Neutro Abs 10/18/2020 2.4  1.7 - 7.7 K/uL Final  . Lymphocytes Relative 10/18/2020 47  % Final  . Lymphs Abs 10/18/2020 2.7  0.7 - 4.0 K/uL Final  . Monocytes Relative 10/18/2020 8  % Final  . Monocytes Absolute 10/18/2020 0.5  0.1 - 1.0 K/uL Final  . Eosinophils Relative 10/18/2020 3  % Final  . Eosinophils Absolute 10/18/2020 0.2  0.0 - 0.5 K/uL Final  . Basophils Relative 10/18/2020 0  % Final  . Basophils Absolute 10/18/2020 0.0  0.0 - 0.1 K/uL  Final  . Immature Granulocytes 10/18/2020  0  % Final  . Abs Immature Granulocytes 10/18/2020 0.02  0.00 - 0.07 K/uL Final   Performed at [facility], [address] [place], [place] [address]    Assessment:  Rebecca Wolf is a 87 y.o. female with a history of breast cancer, carcinoid tumor, and DVT/PE.  She was diagnosed with precancerous breast lesion (? DCIS) in 2003. She underwent lumpectomy. She developedinvasive lobular carcinomain 2007. She underwent segmental mastectomy and sentinel lymph node biopsy followed by radiation. No lymph nodes were involved. Tumor was ER/PR positive. She completed 5 years of Arimidexin 08/2011. Mammogramon 04/18/2018was negative. CA27.29was 20.7(normal) on 09/24/2016 and 12.9 on 08/25/2019.  She was diagnosed with pulmonary carcinoidin 2001 after presenting with an abnormality on chest CT. She was asymptomatic. She underwent resection. She has had no evidence of recurrent disease.  She has a history ofDVT x 2and pulmonary embolism x 2. She has been on anticoagulation for 8-10 years. Chest CT angiogramon 06/29/2017 revealed no evidence of significant pulmonary embolus.There was an unchanged appearance of right paratracheal nodule and multiple pulmonary nodules.She was initially on Coumadin, but switched toEliquisin 2016. She is unaware of any hypercoagulable work-up.   She has a history of GI bleedingin 2002. She is s/p partial right ascending hemicolectomy for a bleeding polyp. She has a history of reflux and peptic ulcer disease. Hematocrit is normal (37.8). MCV is microcytic. While in [place], a hemoglobin electrophoresis was performed (no result available). She was started on oral on 08/31/2012. She is currently not taking iron.  Patient received Venofer x 4 (09/13/2019 - 10/08/2019), x 2(04/20/2020 - 04/27/2020), and x 3 (07/31/2020 - 08/17/2020).  Ferritin has been followed: 43 on 09/24/2016, 80 on 09/23/2017,  12 on 08/25/2019, 20 on 09/13/2019, 237 on 10/15/2019, 57 on 01/10/2020, and 23 on 04/10/2020.  Work upon 08/25/2019 revealed a hematocrit 31.4, hemoglobin 9.9, MCV 75.3, platelets 281,000, WBC 9,300. Ferritin was 12 with an iron saturation of 7% and a TIBC of 420. Creatinine 1.10. Calcium, 8.5. Albumin 3.4.  M- spike was 0 gm/dL.  Vitamin B12 was 157 and folate 5.1. TSH was 2.163. Retic was 1.6%.   She has B12 deficiency.  B12 was 157 on 08/25/2019 and 270 on 10/15/2019.  She was briefly oral B12 (discontinued secondary to diarrhea).  She began B12 injections on 09/20/2019 (last 09/29/2020).  She has folate deficiency.  Folate was 5.1 on 08/25/2019, 6.8 on 09/13/2019 and 13.6 on 10/15/2019.  She is on oral folate.  She has a history of a monoclonal gammopathy of unknown significance (MGUS).  M-spike was 0 on 08/25/2019 and 01/10/2020.  She received the [place] COVID-19 vaccine on 05/19/2020.  Symptomatically, she feels good. She has been struggling with diarrhea; she has not had diarrhea in 3 days. She has not been taking folic acid. She is on Eliquis.   Plan: 1.   Review labs from 10/18/2020 2.   Iron deficiency anemia  Hematocrit 34.5.  Hemoglobin 10.9.  MCV 80.0 on 10/18/2020.   Ferritin 131 with an iron saturation of 16% and a TIBC of 354.  She received Venofer weekly x 3 (last 08/17/2020).  Guaiac cards x3 were negative.  She denies any bleeding.             She was seen by Dr. Vicente Males in 2019 and declined endoscopy secondary to age and risks.  Continue to monitor. 3.   B12 and folate deficiency             Folate 5.1  and B12 157 on 0/24/2021.             Folate 13.6 and B12 271 on 10/15/2019.  Patient began B12 injections on 09/20/2019 (last 09/29/2020).  Continue B12 monthly x 6.  Encourage patient to continue folic acid supplementation.  Check folate annually. 4.   History of invasive lobular cancer             Clinically, she denies any concerns.             Discuss plan for  physical exam at next visit.             Patient declines follow-up mammograms secondary to age. 5.   Carcinoid syndrome             She denies any diarrhea x 3 days.  Continue to monitor. 6.   Recurrent DVT and pulmonary embolism             She is on Eliquis.             She denies any bleeding. 7.   History of MGUS             M-spike was 0 on 01/10/2020.  Check SPEP annually unless any concerning symptoms or laboratory abnormalities. 8.   RTC for B12 monthly x 6 (last 09/29/2020). 9.   RTC in 3 months (when B12 due) for MD assessment, breast exam, labs (CBC with diff, ferritin, iron studies, folate- day before), B12 and +/- Venofer.  I discussed the assessment and treatment plan with the patient.  The patient was provided an opportunity to ask questions and all were answered.  The patient agreed with the plan and demonstrated an understanding of the instructions.  The patient was advised to call back or seek an in person evaluation if the symptoms worsen or if the condition fails to improve as anticipated.  I provided 14 minutes (1:48 PM - 2:02 PM) of face-to-face video visit time during this this encounter and > 50% was spent counseling as documented under my assessment and plan.  An additional 7 minutes were spent reviewing her chart (Epic and Care Everywhere) including notes, labs, and imaging studies.  I provided these services from the [facility] office.   Nolon Stalls, MD, PhD  10/19/2020, 2:04 PM  I, Mirian Mo Tufford, am acting as Education administrator for [employer]. Mike Gip, MD, PhD.  I, Sahmir Weatherbee C. Mike Gip, MD, have reviewed the above documentation for accuracy and completeness, and I agree with the above.

## 2020-10-19 ENCOUNTER — Inpatient Hospital Stay: Payer: Medicare Other

## 2020-10-19 ENCOUNTER — Inpatient Hospital Stay (HOSPITAL_BASED_OUTPATIENT_CLINIC_OR_DEPARTMENT_OTHER): Payer: Medicare Other | Admitting: Hematology and Oncology

## 2020-10-19 ENCOUNTER — Encounter: Payer: Self-pay | Admitting: Hematology and Oncology

## 2020-10-19 DIAGNOSIS — D509 Iron deficiency anemia, unspecified: Secondary | ICD-10-CM | POA: Diagnosis not present

## 2020-10-19 DIAGNOSIS — E538 Deficiency of other specified B group vitamins: Secondary | ICD-10-CM | POA: Diagnosis not present

## 2020-10-19 DIAGNOSIS — D472 Monoclonal gammopathy: Secondary | ICD-10-CM

## 2020-10-19 DIAGNOSIS — I2782 Chronic pulmonary embolism: Secondary | ICD-10-CM | POA: Diagnosis not present

## 2020-10-19 DIAGNOSIS — Z853 Personal history of malignant neoplasm of breast: Secondary | ICD-10-CM

## 2020-10-19 NOTE — Progress Notes (Signed)
Patient has chronic diarrhea that is being treated by PCP.  Would like to know if she needs to have breast exams and mammograms.

## 2020-10-20 ENCOUNTER — Ambulatory Visit: Payer: Medicare Other | Admitting: Podiatry

## 2020-10-25 ENCOUNTER — Ambulatory Visit: Admit: 2020-10-25 | Payer: Medicare Other | Admitting: Ophthalmology

## 2020-10-25 ENCOUNTER — Ambulatory Visit: Payer: Medicare Other

## 2020-10-25 SURGERY — PHACOEMULSIFICATION, CATARACT, WITH IOL INSERTION
Anesthesia: Topical | Laterality: Left

## 2020-10-27 ENCOUNTER — Telehealth: Payer: Self-pay | Admitting: Podiatry

## 2020-10-27 NOTE — Telephone Encounter (Signed)
pts daughter called about the diabetic shoes that were ordered wanted to know if pt would have to come back in and when should they expect the shoes in..  I explained that the pt will have to come in for an fitting and that we have faxed the paperwork to her pcp several times and have not received it back yet and once we get it back it usually takes 2 to 3 wks to get the shoes. She was going to call the pcp.

## 2020-10-27 NOTE — Telephone Encounter (Signed)
pts daughter called back and left message.   I returned call and left message for her to call me back.

## 2020-10-30 ENCOUNTER — Inpatient Hospital Stay: Payer: Medicare Other

## 2020-11-03 ENCOUNTER — Inpatient Hospital Stay: Payer: Medicare Other

## 2020-11-10 ENCOUNTER — Inpatient Hospital Stay: Payer: Medicare Other | Attending: Oncology

## 2020-11-10 ENCOUNTER — Other Ambulatory Visit: Payer: Self-pay

## 2020-11-10 ENCOUNTER — Inpatient Hospital Stay: Payer: Medicare Other

## 2020-11-10 DIAGNOSIS — E538 Deficiency of other specified B group vitamins: Secondary | ICD-10-CM | POA: Diagnosis not present

## 2020-11-10 DIAGNOSIS — D509 Iron deficiency anemia, unspecified: Secondary | ICD-10-CM

## 2020-11-10 MED ORDER — CYANOCOBALAMIN 1000 MCG/ML IJ SOLN
1000.0000 ug | Freq: Once | INTRAMUSCULAR | Status: AC
  Administered 2020-11-10: 1000 ug via INTRAMUSCULAR
  Filled 2020-11-10: qty 1

## 2020-11-30 ENCOUNTER — Inpatient Hospital Stay: Payer: Medicare Other

## 2020-12-04 ENCOUNTER — Inpatient Hospital Stay: Payer: Medicare Other

## 2020-12-06 ENCOUNTER — Other Ambulatory Visit: Payer: Medicare Other

## 2020-12-06 ENCOUNTER — Other Ambulatory Visit: Payer: Self-pay

## 2020-12-06 DIAGNOSIS — E1142 Type 2 diabetes mellitus with diabetic polyneuropathy: Secondary | ICD-10-CM | POA: Diagnosis not present

## 2020-12-06 DIAGNOSIS — M216X2 Other acquired deformities of left foot: Secondary | ICD-10-CM

## 2020-12-06 DIAGNOSIS — M216X1 Other acquired deformities of right foot: Secondary | ICD-10-CM

## 2020-12-08 ENCOUNTER — Ambulatory Visit: Payer: Medicare Other

## 2020-12-22 ENCOUNTER — Encounter: Payer: Self-pay | Admitting: Hematology and Oncology

## 2020-12-22 ENCOUNTER — Ambulatory Visit: Payer: Medicare Other | Attending: Internal Medicine

## 2020-12-22 ENCOUNTER — Inpatient Hospital Stay: Payer: Medicare Other | Attending: Oncology

## 2020-12-22 ENCOUNTER — Other Ambulatory Visit: Payer: Self-pay

## 2020-12-22 DIAGNOSIS — Z23 Encounter for immunization: Secondary | ICD-10-CM

## 2020-12-22 MED ORDER — PFIZER-BIONT COVID-19 VAC-TRIS 30 MCG/0.3ML IM SUSP
INTRAMUSCULAR | 0 refills | Status: DC
  Filled 2020-12-22: qty 0.3, 1d supply, fill #0

## 2020-12-22 NOTE — Progress Notes (Signed)
   Covid-19 Vaccination Clinic  Name:  Rebecca Wolf    MRN: [id number] DOB: June 05, 1925  12/22/2020  Ms. Karstens was observed post Covid-19 immunization for 15 minutes without incident. She was provided with Vaccine Information Sheet and instruction to access the V-Safe system.   Ms. Lindo was instructed to call 911 with any severe reactions post vaccine: Difficulty breathing  Swelling of face and throat  A fast heartbeat  A bad rash all over body  Dizziness and weakness   Immunizations Administered     Name Date Dose VIS Date Route   PFIZER Comrnaty(Gray TOP) Covid-19 Vaccine 12/22/2020 10:00 AM 0.3 mL 06/08/2020 Intramuscular   Manufacturer: [place]   Lot: [id number]   NDC: [phone]

## 2020-12-28 ENCOUNTER — Telehealth: Payer: Self-pay | Admitting: Oncology

## 2020-12-28 NOTE — Telephone Encounter (Signed)
Patient's daughter called and stated that patient's labs are being monitored by her PCP and has requested we cancel all future appts. Told daughter to feel free to reach back out to [place] if patient needs anything in the future.

## 2021-01-02 ENCOUNTER — Other Ambulatory Visit: Payer: Medicare Other

## 2021-01-03 ENCOUNTER — Ambulatory Visit: Payer: Medicare Other

## 2021-01-03 ENCOUNTER — Ambulatory Visit: Payer: Medicare Other | Admitting: Oncology

## 2021-01-05 ENCOUNTER — Telehealth: Payer: Self-pay | Admitting: Podiatry

## 2021-01-05 NOTE — Telephone Encounter (Signed)
Pts daughter called and left message on my voicemail on 7.6 @ 1205pm asking for a call back.  I returned call and left message for her to call me back and she returned call right after I hung up. She was asking if pt could possibly get a sandal type shoe for the summer verses the one she is returning and if her insurance would cover them.  I explained that she is returning the shoes as long as they are not soiled and we will reorder the new shoe she chooses and there will be not additional charges. She is scheduled to see EJ on 7.13.2022 in [place] office.

## 2021-01-10 ENCOUNTER — Ambulatory Visit (INDEPENDENT_AMBULATORY_CARE_PROVIDER_SITE_OTHER): Payer: Medicare Other

## 2021-01-10 ENCOUNTER — Other Ambulatory Visit: Payer: Self-pay

## 2021-01-10 DIAGNOSIS — R600 Localized edema: Secondary | ICD-10-CM

## 2021-01-17 ENCOUNTER — Other Ambulatory Visit: Payer: Medicare Other

## 2021-01-18 ENCOUNTER — Ambulatory Visit: Payer: Medicare Other

## 2021-01-18 ENCOUNTER — Ambulatory Visit: Payer: Medicare Other | Admitting: Hematology and Oncology

## 2021-01-29 ENCOUNTER — Inpatient Hospital Stay: Payer: Medicare Other

## 2021-01-30 ENCOUNTER — Ambulatory Visit: Payer: Medicare Other

## 2021-01-31 ENCOUNTER — Other Ambulatory Visit: Payer: Self-pay

## 2021-01-31 DIAGNOSIS — R197 Diarrhea, unspecified: Secondary | ICD-10-CM | POA: Diagnosis not present

## 2021-01-31 DIAGNOSIS — Z853 Personal history of malignant neoplasm of breast: Secondary | ICD-10-CM | POA: Diagnosis not present

## 2021-01-31 DIAGNOSIS — Z7952 Long term (current) use of systemic steroids: Secondary | ICD-10-CM | POA: Diagnosis not present

## 2021-01-31 DIAGNOSIS — M5441 Lumbago with sciatica, right side: Secondary | ICD-10-CM | POA: Insufficient documentation

## 2021-01-31 DIAGNOSIS — N1832 Chronic kidney disease, stage 3b: Secondary | ICD-10-CM | POA: Diagnosis not present

## 2021-01-31 DIAGNOSIS — E1122 Type 2 diabetes mellitus with diabetic chronic kidney disease: Secondary | ICD-10-CM | POA: Diagnosis not present

## 2021-01-31 DIAGNOSIS — I129 Hypertensive chronic kidney disease with stage 1 through stage 4 chronic kidney disease, or unspecified chronic kidney disease: Secondary | ICD-10-CM | POA: Diagnosis not present

## 2021-01-31 DIAGNOSIS — Z79899 Other long term (current) drug therapy: Secondary | ICD-10-CM | POA: Diagnosis not present

## 2021-01-31 DIAGNOSIS — Z9012 Acquired absence of left breast and nipple: Secondary | ICD-10-CM | POA: Insufficient documentation

## 2021-01-31 DIAGNOSIS — J4520 Mild intermittent asthma, uncomplicated: Secondary | ICD-10-CM | POA: Diagnosis not present

## 2021-01-31 DIAGNOSIS — Z87891 Personal history of nicotine dependence: Secondary | ICD-10-CM | POA: Diagnosis not present

## 2021-01-31 LAB — CBC
HCT: 34.6 % — ABNORMAL LOW (ref 36.0–46.0)
Hemoglobin: 11.1 g/dL — ABNORMAL LOW (ref 12.0–15.0)
MCH: 25.6 pg — ABNORMAL LOW (ref 26.0–34.0)
MCHC: 32.1 g/dL (ref 30.0–36.0)
MCV: 79.7 fL — ABNORMAL LOW (ref 80.0–100.0)
Platelets: 185 10*3/uL (ref 150–400)
RBC: 4.34 MIL/uL (ref 3.87–5.11)
RDW: 15.7 % — ABNORMAL HIGH (ref 11.5–15.5)
WBC: 7.2 10*3/uL (ref 4.0–10.5)
nRBC: 0 % (ref 0.0–0.2)

## 2021-01-31 LAB — TROPONIN I (HIGH SENSITIVITY): Troponin I (High Sensitivity): 7 ng/L (ref <18)

## 2021-01-31 LAB — COMPREHENSIVE METABOLIC PANEL
ALT: 13 U/L (ref 0–44)
AST: 15 U/L (ref 15–41)
Albumin: 3.4 g/dL — ABNORMAL LOW (ref 3.5–5.0)
Alkaline Phosphatase: 97 U/L (ref 38–126)
Anion gap: 7 (ref 5–15)
BUN: 16 mg/dL (ref 8–23)
CO2: 26 mmol/L (ref 22–32)
Calcium: 9.1 mg/dL (ref 8.9–10.3)
Chloride: 106 mmol/L (ref 98–111)
Creatinine, Ser: 0.86 mg/dL (ref 0.44–1.00)
GFR, Estimated: >60 mL/min (ref >60)
Glucose, Bld: 128 mg/dL — ABNORMAL HIGH (ref 70–99)
Potassium: 3.9 mmol/L (ref 3.5–5.1)
Sodium: 139 mmol/L (ref 135–145)
Total Bilirubin: 0.5 mg/dL (ref 0.3–1.2)
Total Protein: 6.6 g/dL (ref 6.5–8.1)

## 2021-01-31 LAB — LIPASE, BLOOD: Lipase: 23 U/L (ref 11–51)

## 2021-01-31 NOTE — ED Triage Notes (Signed)
Pt states she started having pain under right breast earlier today, pt took tylenol and pain improved. Pt now having mid back pain, hx of spinal stenosis but unsure if same pain.

## 2021-02-01 ENCOUNTER — Emergency Department
Admission: EM | Admit: 2021-02-01 | Discharge: 2021-02-01 | Disposition: A | Discharge status: Home / Self Care | Payer: Medicare Other | Attending: Emergency Medicine | Admitting: Emergency Medicine

## 2021-02-01 DIAGNOSIS — M5441 Lumbago with sciatica, right side: Secondary | ICD-10-CM | POA: Diagnosis not present

## 2021-02-01 DIAGNOSIS — M549 Dorsalgia, unspecified: Secondary | ICD-10-CM

## 2021-02-01 LAB — TROPONIN I (HIGH SENSITIVITY): Troponin I (High Sensitivity): 8 ng/L (ref <18)

## 2021-02-01 MED ORDER — LIDOCAINE 5 % EX PTCH: 1.0000 | MEDICATED_PATCH | Freq: Two times a day (BID) | CUTANEOUS | 0 refills | Status: DC

## 2021-02-01 MED ORDER — LIDOCAINE 5 % EX PTCH
1.0000 | MEDICATED_PATCH | CUTANEOUS | Status: DC
  Filled 2021-02-01: qty 1

## 2021-02-01 NOTE — ED Notes (Signed)
Had not taken patient off of board yet d/t family stating they would return inside for paperwork.  Family never returned, refused vitals from [place], [place] when leaving or signature.

## 2021-02-01 NOTE — ED Notes (Addendum)
Pt family member approached this RN and asked if she could take patient to the car because she is getting restless, reported she will come back for discharge paperwork. This RN attempted to obtain a full set of vitals at discharge. Unable to obtain HR and oxygen with nurse on a stick, attempted to retrieve another nurse on a stick. Family member stated, " I have a oxygen probe at home". Family member refused reassessment of HR and oxygen levels at time of discharge.

## 2021-02-01 NOTE — ED Provider Notes (Signed)
[facility] Emergency Department Provider Note  ____________________________________________   Event Date/Time   First MD Initiated Contact with Patient 02/01/21 0308     (approximate)  I have reviewed the triage vital signs and the nursing notes.   HISTORY  Chief Complaint Back Pain    HPI Rebecca Wolf is a 87 y.o. female with medical history as listed below who presents for evaluation of pain in the middle of the right side of her back.  Her daughter is with her to provide additional history although the patient is very sharp with no evidence of dementia.  She has had no recent trauma, although she stretched recently and that seemed to exacerbate the pain.  Reportedly the symptoms started after the patient and her daughter ate at a restaurant with which they were not familiar.  Afterwards the patient had a couple of episodes of diarrhea and some pain underneath the right breast.  Those symptoms have resolved but the patient's back is hurting.  She thinks she may have stretched something.  She is having no chest pain, shortness of breath, nausea/vomiting, nor abdominal pain.  She has had no recent dysuria.  She is feeling better now than she did earlier.  Nothing in particular made her feel better.     Past Medical History:  Diagnosis Date   Asthma    Breast cancer ([place]) 2003   LT LUMPECTOMY   Breast cancer ([place]) 2004   LT LUMPECTOMY   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   GERD (gastroesophageal reflux disease)    Hemorrhoids    Hypertension    Personal history of chemotherapy 2004   BREAST CA   Personal history of radiation therapy 2003   BREAST CA   Personal history of radiation therapy 2004   BREAST CA   Polyp of colon 2002   bleeding polyp of right ascending colon   PUD (peptic ulcer disease)    Seizures ([place])    epilepsy well controlled    Patient Active Problem List   Diagnosis Date Noted   B12 deficiency 10/17/2019    Folate deficiency 10/17/2019   Anemia 08/29/2019   Iron deficiency anemia 08/26/2019   Chronic anticoagulation 10/25/2018   CKD stage G3b/A1, GFR 30-44 and albumin creatinine ratio <30 mg/g (HCC) 10/05/2018   MGUS (monoclonal gammopathy of unknown significance) 07/23/2018   Osteopenia after menopause 07/16/2018   Diabetes mellitus with no complication ([place]) [id number]   Vitamin D deficiency 04/07/2018   Heart palpitations 01/06/2018   Tachycardia 01/06/2018   Cystocele with rectocele 10/30/2017   Rectocele 10/30/2017   Microcytic red blood cells 09/23/2017   Localized edema 02/13/2017   Hemorrhoids 05/17/2016   Obesity (BMI 30-39.9) 04/26/2016   Multiple lung nodules 04/19/2016   Chest pain 04/17/2016   Partial symptomatic epilepsy with complex partial seizures, not intractable, without status epilepticus ([place]) 06/30/2015   Bronchiectasis without complication ([place]) [id number]   Imbalance 11/08/2014   Essential (primary) hypertension 11/08/2014   Personal history of malignant neoplasm of breast 11/08/2014   History of malignant carcinoid tumor of bronchus and lung 11/08/2014   H/O peptic ulcer 11/08/2014   Asthma, mild intermittent 11/08/2014   Pulmonary embolism ([place]) 11/08/2014   Gonalgia 11/08/2014   Leg varices 11/08/2014    Past Surgical History:  Procedure Laterality Date   BREAST EXCISIONAL BIOPSY Left 2003   positive   BREAST EXCISIONAL BIOPSY Left 2004   positive   BREAST LUMPECTOMY Left 2003  BREAST CA   BREAST LUMPECTOMY Left 2004   BREAST CA   HEMICOLECTOMY Right    bowel obstruction   HEMORRHOID SURGERY     THORACOTOMY/LOBECTOMY  1999   carcinoid tumor   TOTAL VAGINAL HYSTERECTOMY     VARICOSE VEIN SURGERY      Prior to Admission medications   Medication Sig Start Date End Date Taking? Authorizing Provider  lidocaine (LIDODERM) 5 % Place 1 patch onto the skin every 12 (twelve) hours. Remove & Discard patch within 12 hours or as directed by MD 02/01/21  02/01/22 Yes Hinda Kehr, MD  acetaminophen (TYLENOL) 500 MG tablet Take 500 mg by mouth every 6 (six) hours as needed.    [provider]  albuterol (ACCUNEB) 1.25 MG/3ML nebulizer solution Inhale 1 ampule into the lungs 4 (four) times daily as needed. 05/06/14   [provider]  amLODipine (NORVASC) 5 MG tablet TAKE 1 TABLET BY MOUTH EVERY DAY Patient taking differently: Take 2.5 mg by mouth daily. 02/26/18   Glean Hess, MD  azelastine (ASTELIN) 0.1 % nasal spray U 1 SPRAY IEN BID TO REPLACE PATANASE 11/24/18   [provider]  azithromycin (ZITHROMAX) 250 MG tablet Take 250 mg by mouth daily. 03/07/19   [provider]  budesonide (PULMICORT) 0.5 MG/2ML nebulizer solution Inhale 0.5 mg into the lungs 2 (two) times daily as needed. 01/05/19 10/19/20  [provider]  Cholecalciferol 25 MCG (1000 UT) tablet Take 1,000 Units by mouth daily.  Patient not taking: Reported on 10/19/2020 07/09/19   [provider]  COVID-19 mRNA Vac-TriS, Pfizer, (PFIZER-BIONT COVID-19 VAC-TRIS) SUSP injection Inject into the muscle. 12/22/20   Carlyle Basques, MD  COVID-19 mRNA vaccine, Pfizer, 30 MCG/0.3ML injection USE AS DIRECTED Patient not taking: Reported on 10/19/2020 05/19/20 05/19/21  Carlyle Basques, MD  diltiazem (CARDIZEM LA) 180 MG 24 hr tablet Take 180 mg by mouth daily. 07/07/17 10/19/20  [provider]  ELIQUIS 2.5 MG TABS tablet TAKE 1 TABLET BY MOUTH TWICE DAILY 05/29/18   Glean Hess, MD  fluticasone [facility]) 50 MCG/ACT nasal spray Place 2 sprays into the nose daily.    [provider]  Fluticasone-Salmeterol (ADVAIR) 500-50 MCG/DOSE AEPB Inhale 1 puff into the lungs 2 (two) times daily. Patient not taking: No sig reported    [provider]  furosemide (LASIX) 20 MG tablet every other day. 08/31/20   [provider]  levalbuterol Penne Lash) 0.63 MG/3ML nebulizer solution VVN Q 4 H PRN FOR [facility] 02/18/19   [provider]  levETIRAcetam (KEPPRA) 750 MG tablet Take 1 tablet by mouth 2 (two) times daily. 06/29/13   [provider]  montelukast (SINGULAIR) 10 MG tablet Take 1 tablet by mouth daily.    [provider]  Olopatadine HCl 0.6 % SOLN Place 1 puff into the nose 2 (two) times daily. Patient not taking: No sig reported    [provider]  pantoprazole (PROTONIX) 40 MG tablet TAKE 1 TABLET BY MOUTH TWICE DAILY Patient taking differently: daily. 11/05/18   Glean Hess, MD  prednisoLONE acetate (PRED FORTE) 1 % ophthalmic suspension INTILL 2 GTS INTO OU QID 09/23/17   [provider]  predniSONE (DELTASONE) 2.5 MG tablet Take 2.5 mg by mouth daily with breakfast.    [provider]  spironolactone (ALDACTONE) 25 MG tablet Take 1 tablet by mouth daily. 08/31/20 08/31/21  [provider]  spironolactone-hydrochlorothiazide (ALDACTAZIDE) 25-25 MG tablet TAKE 1 TABLET BY MOUTH DAILY Patient  not taking: Reported on 10/19/2020 11/28/17   Glean Hess, MD  sucralfate (CARAFATE) 1 g tablet Take 1 tablet (1 g total) by mouth 4 (four) times daily. 12/25/17   Jonathon Bellows, MD    Allergies Levofloxacin and Sulfa antibiotics  Family History  Problem Relation Age of Onset   Alcoholism Father    Diabetes Sister    Breast cancer Neg Hx     Social History Social History   Tobacco Use   Smoking status: Former   Smokeless tobacco: Never  Scientific laboratory technician Use: Never used  Substance Use Topics   Alcohol use: No    Alcohol/week: 0.0 standard drinks   Drug use: No    Review of Systems Constitutional: No fever/chills Eyes: No visual changes. ENT: No sore throat. Cardiovascular: Initially there is some pain below the right breast, now resolved.  No ongoing chest pain. Respiratory: Denies shortness of breath. Gastrointestinal: No abdominal pain.  No nausea, no vomiting.  No diarrhea.  No constipation. Genitourinary: Negative for  dysuria. Musculoskeletal: Pain in the right side of the mid back. Integumentary: Negative for rash. Neurological: Negative for headaches, focal weakness or numbness.   ____________________________________________   PHYSICAL EXAM:  VITAL SIGNS: ED Triage Vitals [01/31/21 2107]  Enc Vitals Group     BP (!) 150/84     Pulse Rate 74     Resp 20     Temp 98.3 F (36.8 C)     Temp Source Oral     SpO2 100 %     Weight 75.8 kg (167 lb)     Height 1.626 m ('5\' 4"'$ )     Head Circumference      Peak Flow      Pain Score 6     Pain Loc      Pain Edu?      Excl. in [place]?     Constitutional: Alert and oriented.  Elderly but sharp and oriented. Eyes: Conjunctivae are normal.  Head: Atraumatic. Nose: No congestion/rhinnorhea. Mouth/Throat: Patient is wearing a mask. Neck: No stridor.  No meningeal signs.   Cardiovascular: Normal rate, regular rhythm. Good peripheral circulation. Respiratory: Normal respiratory effort.  No retractions. Gastrointestinal: Soft and nontender. No distention.  Musculoskeletal: Easily reproducible tenderness to palpation of the right posterior mid back.  No midline tenderness.  No gross deformities or palpable abnormalities.  The area is diffuse and not specific to 1 particular point. Neurologic:  Normal speech and language. No gross focal neurologic deficits are appreciated.  Skin:  Skin is warm, dry and intact.  No erythema nor vesicular lesions in the area of tenderness on her back. Psychiatric: Mood and affect are normal. Speech and behavior are normal.  ____________________________________________   LABS (all labs ordered are listed, but only abnormal results are displayed)  Labs Reviewed  CBC - Abnormal; Notable for the following components:      Result Value   Hemoglobin 11.1 (*)    HCT 34.6 (*)    MCV 79.7 (*)    MCH 25.6 (*)    RDW 15.7 (*)    All other components within normal limits  COMPREHENSIVE METABOLIC PANEL - Abnormal; Notable for  the following components:   Glucose, Bld 128 (*)    Albumin 3.4 (*)    All other components within normal limits  LIPASE, BLOOD  TROPONIN I (HIGH SENSITIVITY)  TROPONIN I (HIGH SENSITIVITY)   ____________________________________________  EKG  ED ECG REPORT I, Hinda Kehr, the attending  physician, personally viewed and interpreted this ECG.  Date: 01/31/2021 EKG Time: 21: 09 Rate: 75 Rhythm: Sinus rhythm with first-degree AV block QRS Axis: Left axis deviation Intervals: normal ST/T Wave abnormalities: Non-specific ST segment / T-wave changes, but no clear evidence of acute ischemia. Narrative Interpretation: no definitive evidence of acute ischemia; does not meet STEMI criteria.  ____________________________________________   INITIAL IMPRESSION / MDM / ASSESSMENT AND PLAN / ED COURSE  As part of my medical decision making, I reviewed the following data within the electronic MEDICAL RECORD NUMBER History obtained from family, Nursing notes reviewed and incorporated, Labs reviewed , EKG interpreted , Old chart reviewed, and Notes from prior ED visits   Differential diagnosis includes, but is not limited to, musculoskeletal strain, shingles, biliary colic, ACS, PE, pneumonia, UTI/pyonephritis.  The patient is almost surprisingly well appearing for her age.  She is not in any distress right now but has reproducible back pain on the right side with palpation and with certain movements.  Her vital signs are reassuring other than hypertension.  Her EKG is nonischemic.   She has no tenderness to palpation of the abdomen, a normal basic metabolic panel, and normal lipase.  CBC is within normal limits.  High-sensitivity troponins are negative x2.  Overall she has a reassuring exam work-up and I believe her symptoms are most likely musculoskeletal.  I talked with the patient and her daughter about additional testing such as CT scans or other imaging or checking a urinalysis.  However she has no  urinary symptoms and the probability of asymptomatic bacteriuria is very high and will likely be noncontributory to the current presentation and could lead to unnecessary antibiotics.  The daughter understands and agrees.  We will try Lidoderm patch and the patient and her daughter are very comfortable and agreeable to the plan to go home and follow-up with her PCP.  I gave my usual and customary return precautions.           ____________________________________________  FINAL CLINICAL IMPRESSION(S) / ED DIAGNOSES  Final diagnoses:  Mid back pain on right side     MEDICATIONS GIVEN DURING THIS VISIT:  Medications  lidocaine (LIDODERM) 5 % 1 patch (has no administration in time range)     ED Discharge Orders          Ordered    lidocaine (LIDODERM) 5 %  Every 12 hours        02/01/21 0406             Note:  This document was prepared using Dragon voice recognition software and may include unintentional dictation errors.   Hinda Kehr, MD 02/01/21 [phone]

## 2021-02-01 NOTE — ED Notes (Signed)
ED Provider at bedside. 

## 2021-02-01 NOTE — Discharge Instructions (Addendum)
Your workup in the Emergency Department today was reassuring.  We did not find any specific abnormalities.  We recommend you drink plenty of fluids, take your regular medications and/or any new ones prescribed today, and follow up with the doctor(s) listed in these documents as recommended.  Return to the Emergency Department if you develop new or worsening symptoms that concern you.  

## 2021-02-14 ENCOUNTER — Other Ambulatory Visit: Payer: Self-pay

## 2021-02-14 ENCOUNTER — Ambulatory Visit (INDEPENDENT_AMBULATORY_CARE_PROVIDER_SITE_OTHER): Payer: Medicare Other | Admitting: Podiatry

## 2021-02-14 DIAGNOSIS — R600 Localized edema: Secondary | ICD-10-CM

## 2021-02-14 DIAGNOSIS — L989 Disorder of the skin and subcutaneous tissue, unspecified: Secondary | ICD-10-CM

## 2021-02-14 NOTE — Progress Notes (Signed)
Patient presented to the office to pick up diabetic shoes only  Patient was fitted with the shoes and the shoes were comfortable and no defects

## 2021-02-27 ENCOUNTER — Ambulatory Visit: Payer: Medicare Other | Admitting: Physical Therapy

## 2021-03-06 ENCOUNTER — Inpatient Hospital Stay: Payer: Medicare Other

## 2021-03-08 ENCOUNTER — Encounter: Payer: Self-pay | Admitting: Physical Therapy

## 2021-03-08 ENCOUNTER — Encounter: Payer: Medicare Other | Admitting: Physical Therapy

## 2021-03-08 ENCOUNTER — Ambulatory Visit: Payer: Medicare Other | Attending: Internal Medicine | Admitting: Physical Therapy

## 2021-03-08 DIAGNOSIS — R262 Difficulty in walking, not elsewhere classified: Secondary | ICD-10-CM | POA: Diagnosis not present

## 2021-03-08 DIAGNOSIS — M6281 Muscle weakness (generalized): Secondary | ICD-10-CM | POA: Insufficient documentation

## 2021-03-08 NOTE — Therapy (Signed)
[facility] [address] [place] [address], [place], [address] Phone: [phone]   Fax:  [phone]  Physical Therapy Evaluation  Patient Details  Name: Rebecca Wolf MRN: [id number] Date of Birth: 11-Sep-1924 Referring Provider (PT): Mike Gip MD   Encounter Date: 03/08/2021   PT End of Session - 03/08/21 1326     Visit Number 1    Number of Visits 17    Date for PT Re-Evaluation 05/04/21    Authorization - Visit Number 1    Authorization - Number of Visits 10    PT Start Time 1115    PT Stop Time 1200    PT Time Calculation (min) 45 min    Equipment Utilized During Treatment Gait belt    Activity Tolerance Patient tolerated treatment well;Patient limited by fatigue    Behavior During Therapy [facility] for tasks assessed/performed             Past Medical History:  Diagnosis Date   Asthma    Breast cancer ([place]) 2003   LT LUMPECTOMY   Breast cancer ([place]) 2004   LT LUMPECTOMY   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   GERD (gastroesophageal reflux disease)    Hemorrhoids    Hypertension    Personal history of chemotherapy 2004   BREAST CA   Personal history of radiation therapy 2003   BREAST CA   Personal history of radiation therapy 2004   BREAST CA   Polyp of colon 2002   bleeding polyp of right ascending colon   PUD (peptic ulcer disease)    Seizures ([place])    epilepsy well controlled    Past Surgical History:  Procedure Laterality Date   BREAST EXCISIONAL BIOPSY Left 2003   positive   BREAST EXCISIONAL BIOPSY Left 2004   positive   BREAST LUMPECTOMY Left 2003   BREAST CA   BREAST LUMPECTOMY Left 2004   BREAST CA   HEMICOLECTOMY Right    bowel obstruction   HEMORRHOID SURGERY     THORACOTOMY/LOBECTOMY  1999   carcinoid tumor   TOTAL VAGINAL HYSTERECTOMY     VARICOSE VEIN SURGERY      There were no vitals filed for this visit.    Subjective Assessment - 03/08/21 1118      Subjective Rebecca Wolf is a 87 y.o. female reporting to therapy with primary concerns of her balance and decreased strength, difficulty walking on uneven surfaces, and transferring from sitting to standing. Patient reports difficulty standing longer than >1 min and walking >2 min. Pt reports she ambulates with her rollator. She states that she is able to bathe and dress herself in sitting but requires assistance negotiating stairs, curbs, cleaning, and preparing food. For fun, she enjoys sitting and watching on a bench at [employer]. She reports having 1 fall in the last 6 months when her walker got stuck on an uneven cobblestone causing her to lose her balance.  Pt denies any unexplained weight fluctuation, saddle paresthesia, loss of bowel/bladder function, or unrelenting night pain at this time. She does have a history of spinal stenosis and previous brest cancer.    Patient is accompained by: Family member    Pertinent History Anal Prolapse, Spinal stenosis, HTN    Limitations Lifting;Sitting;Standing;Walking;House hold activities    How long can you stand comfortably? 1 minutes    How long can you walk comfortably? 5 min    Patient Stated Goals improve balance  Currently in Pain? No/denies            HIP MMT   BLE flex/ER/IR abd in sitting 4-/5 (heavy post lean for bilat flex) Hip ext not formally tested clear weakness with STS transfers and inability to stand erect with neutral hip ext bilat   Knee MMT  Grossly 4/5 bilat  Ankle MMT  Dorsiflexion 4/5 bilat  Plantaflexion 3+/5 bilat  Hip AROM WFL - other than hip ext with approx ~20d deficit  Knee AROM WFL  Ankle AROM  WFL  Test and Measures  5xSTS: 50 sec requires UE use with difficulty to come fully erect Standing feet together  feet approx 8' apart: 15 sec  *attempt eyes closed patient unable Romberg standing: <5 sec heavy ankle strategy used, eventually stepping to prevent fall; minA from therapist to  return to standing position Standing unsupported; <30 sec Picking object off floor : unsteady TUG: 23 sec with sitting rollator far from patient, leaving to side to sit 2MWT:  fatigued at 1 min 45 sec @ 210f 10MWT: 16 sec = 0.625 m/s   Observations:   Posture: excessive thoracic kyphosis, trunk flexion, forward head, bilaterally rounded shoulders, maintained bilat hip flex in standing and amb. Stands with  bilat knee valgus, bilat ankle eversion and midfoot pronation  Gait: decreased stride/step length, no hip ext throughout gait with decreased terminal stance bilat; near shuffling gait with decreased toe off and step clearance. Ambulates with rollator too far ahead perpetuating forward trunk lean, multiple cues to keep rollator close for safety. Decreased gait speed  Sit to stand transfer: heavy UE push off from chair with quick bilat hand grasp to rollator with heavy UE push; use of legs at back of table for balance; pt does attempt to lock rollator prior to stand, but not prior to sit         Objective measurements completed on examination: See above findings.      Therapeutic Exercise  Seated Hip ABD 2 x 10 reps  Seat Marches 2 x 10 reps *yellow theraband   Ther-Ex PT reviewed the following HEP with patient with patient able to demonstrate a set of the following with min cuing for correction needed. PT educated patient on parameters of therex (how/when to inc/decrease intensity, frequency, rep/set range, stretch hold time, and purpose of therex) with verbalized understanding.       PT Education - 03/08/21 1328     Education Details HEP and PT POC    Person(s) Educated Patient    Methods Explanation;Demonstration    Comprehension Verbalized understanding;Returned demonstration              PT Short Term Goals - 03/08/21 1317       PT SHORT TERM GOAL #1   Title Pt will demonstrate independence with HEP to improve functional mobility for increased ability  to participate with ADLs    Baseline HEP given    Time 3    Period Weeks    Status New    Target Date 03/29/21               PT Long Term Goals - 03/08/21 1318       PT LONG TERM GOAL #1   Title Patient will increase FOTO score to ** to demonstrate predicted increase in functional mobility to complete ADLs    Time 6    Period Weeks    Status New      PT LONG TERM GOAL #2  Title Patient will improve TUG to under 12 seconds to indicate signficant improvement in fall risk.     Baseline TUG: 23 sec 03/08/21    Time 6    Period Weeks    Status New    Target Date 04/19/21      PT LONG TERM GOAL #3   Title Patient will improve 40mnWT to over 3060fto indicate functional improvement in walking tolerance and greater ability to walk in stores before requiring a sitting rest break    Baseline 20039ftopped test at 1:45 sec secondary to fatigue; 03/08/21    Time 6    Period Weeks    Status New      PT LONG TERM GOAL #4   Title Pt will increase gait speed in 10MWT to 0.68m/68mo demonstrate appropriate speed for community ambulation    Baseline 0.62 m/s 03/08/21    Time 6    Period Weeks    Status On-going      PT LONG TERM GOAL #5   Title Patient will be able to stand from 19in surface ind to demonstrate increased bilat LE strength for safety with toilet transfers (handicap height)    Baseline 5xSTS: 50 sec from standard chair with heavy UE use needed 03/08/21    Time 6    Period Weeks    Status New                    Plan - 03/08/21 1308     Clinical Impression Statement Patient is a 89 y46. female presenting with increased fall risk, decreased general strength, pain, abnormal gait, abnormal posture, decreased endurance (muscular and cardiovascular), decreased activity tolerance, and poor balance in static and dynamic positions. Pts current activity limitations in bathing, dressing, walking, standing, transferring; inhibiting full participation in safety with ADLs and  household/community ambulation. Patient will benefit from further skilled therapy in order to improve functional LE strength and balance to improve functional capacity.    Personal Factors and Comorbidities Comorbidity 3+;Fitness;Age;Time since onset of injury/illness/exacerbation    Comorbidities Chronic LBP, spinal stenosis, anal prolapse, HTN    Examination-Activity Limitations Bathing;Lift;Carry;Stand;Stairs;Squat;Sit;Transfers;Bed Mobility;Locomotion Level    Examination-Participation Restrictions Cleaning;Community Activity;Other   self care   Stability/Clinical Decision Making Evolving/Moderate complexity    Clinical Decision Making Moderate    Rehab Potential Fair    PT Frequency 2x / week    PT Duration 6 weeks    PT Treatment/Interventions Electrical Stimulation;Cryotherapy;Moist Heat;Ultrasound;Gait training;Stair training;Functional mobility training;Neuromuscular re-education;Balance training;Therapeutic exercise;Therapeutic activities;Patient/family education;Manual techniques;Passive range of motion;Energy conservation;Wheelchair mobility training;Dry needling    PT Next Visit Plan endurance and strengthening exercise; supine <> sit assessment, FOTO    PT Home Exercise Plan Sitting hip ABD/marching with band    Consulted and Agree with Plan of Care Patient;Family member/caregiver    Family Member Consulted Daughter             Patient will benefit from skilled therapeutic intervention in order to improve the following deficits and impairments:  Abnormal gait, Pain, Improper body mechanics, Decreased mobility, Decreased coordination, Increased muscle spasms, Postural dysfunction, Hypermobility, Decreased activity tolerance, Decreased endurance, Decreased range of motion, Decreased strength, Hypomobility, Decreased balance, Decreased safety awareness, Difficulty walking  Visit Diagnosis: Difficulty in walking, not elsewhere classified  Muscle weakness  (generalized)     Problem List Patient Active Problem List   Diagnosis Date Noted   B12 deficiency 10/17/2019   Folate deficiency 10/17/2019   Anemia 08/29/2019   Iron deficiency  anemia 08/26/2019   Chronic anticoagulation 10/25/2018   CKD stage G3b/A1, GFR 30-44 and albumin creatinine ratio <30 mg/g (HCC) 10/05/2018   MGUS (monoclonal gammopathy of unknown significance) 07/23/2018   Osteopenia after menopause 07/16/2018   Diabetes mellitus with no complication ([employer]) [id number]   Vitamin D deficiency 04/07/2018   Heart palpitations 01/06/2018   Tachycardia 01/06/2018   Cystocele with rectocele 10/30/2017   Rectocele 10/30/2017   Microcytic red blood cells 09/23/2017   Localized edema 02/13/2017   Hemorrhoids 05/17/2016   Obesity (BMI 30-39.9) 04/26/2016   Multiple lung nodules 04/19/2016   Chest pain 04/17/2016   Partial symptomatic epilepsy with complex partial seizures, not intractable, without status epilepticus ([place]) 06/30/2015   Bronchiectasis without complication ([place]) [id number]   Imbalance 11/08/2014   Essential (primary) hypertension 11/08/2014   Personal history of malignant neoplasm of breast 11/08/2014   History of malignant carcinoid tumor of bronchus and lung 11/08/2014   H/O peptic ulcer 11/08/2014   Asthma, mild intermittent 11/08/2014   Pulmonary embolism ([place]) 11/08/2014   Gonalgia 11/08/2014   Leg varices 11/08/2014    Durwin Reges DPT Sharion Settler, SPT  Durwin Reges, PT 03/08/2021, 2:53 PM  [facility] [address] [place], [address] Phone: [phone]   Fax:  [phone]  Name: Rebecca Wolf MRN: [id number] Date of Birth: Oct 02, 1924

## 2021-03-12 ENCOUNTER — Ambulatory Visit: Payer: Medicare Other | Admitting: Physical Therapy

## 2021-03-12 ENCOUNTER — Encounter: Payer: Self-pay | Admitting: Physical Therapy

## 2021-03-12 DIAGNOSIS — R262 Difficulty in walking, not elsewhere classified: Secondary | ICD-10-CM

## 2021-03-12 NOTE — Therapy (Signed)
[facility] [address], [place], [address] Phone: [phone]   Fax:  [phone]  Physical Therapy Treatment  Patient Details  Name: Rebecca Wolf MRN: [id number] Date of Birth: Mar 15, 1925 Referring Provider (PT): Mike Gip MD   Encounter Date: 03/12/2021   PT End of Session - 03/12/21 1202     Visit Number 2    Number of Visits 17    Date for PT Re-Evaluation 05/04/21    Authorization - Visit Number 2    Authorization - Number of Visits 10    PT Start Time 1120    PT Stop Time [id number]    PT Time Calculation (min) 39 min    Equipment Utilized During Treatment Gait belt    Activity Tolerance Patient tolerated treatment well    Behavior During Therapy [facility] for tasks assessed/performed             Past Medical History:  Diagnosis Date   Asthma    Breast cancer ([place]) 2003   LT LUMPECTOMY   Breast cancer ([place]) 2004   LT LUMPECTOMY   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   GERD (gastroesophageal reflux disease)    Hemorrhoids    Hypertension    Personal history of chemotherapy 2004   BREAST CA   Personal history of radiation therapy 2003   BREAST CA   Personal history of radiation therapy 2004   BREAST CA   Polyp of colon 2002   bleeding polyp of right ascending colon   PUD (peptic ulcer disease)    Seizures ([place])    epilepsy well controlled    Past Surgical History:  Procedure Laterality Date   BREAST EXCISIONAL BIOPSY Left 2003   positive   BREAST EXCISIONAL BIOPSY Left 2004   positive   BREAST LUMPECTOMY Left 2003   BREAST CA   BREAST LUMPECTOMY Left 2004   BREAST CA   HEMICOLECTOMY Right    bowel obstruction   HEMORRHOID SURGERY     THORACOTOMY/LOBECTOMY  1999   carcinoid tumor   TOTAL VAGINAL HYSTERECTOMY     VARICOSE VEIN SURGERY      There were no vitals filed for this visit.   Subjective Assessment - 03/12/21 1124     Subjective Pt reports feeling well  today with no reports of falling since last visit. She denies any pain. She does report that she was unable to do exercises because instructions ripped.    Patient is accompained by: Family member    Pertinent History Rebecca Wolf is a 87 y.o. female reporting to therapy with primary concerns of her balance and decreased strength, difficulty walking on uneven surfaces, and transferring from sitting to standing. Patient reports difficulty standing longer than >1 min and walking >2 min. Pt reports she ambulates with her rollator. She states that she is able to bathe and dress herself in sitting but requires assistance negotiating stairs, curbs, cleaning, and preparing food. For fun, she enjoys sitting and watching on a bench at [employer]. She reports having 1 fall in the last 6 months when her walker got stuck on an uneven cobblestone causing her to lose her balance.  Pt denies any unexplained weight fluctuation, saddle paresthesia, loss of bowel/bladder function, or unrelenting night pain at this time. She does have a history of spinal stenosis and previous brest cancer.    Limitations Lifting;Sitting;Standing;Walking;House hold activities    How long can you stand comfortably? 1 minutes  How long can you walk comfortably? 5 min    Patient Stated Goals improve balance            Therex  Nustep level 3 x 5 min with SPM <50 Ambulation with use of rollator -- 2 x 126f with 1 standing rest breaks lasting <10sec  Sit to standing with UE support - x10 reps  with rest break between; PT cueing to reach for handrail following stand  Heel raises in standing with UE support - x 20 reps  Marches in standing with B UE support 2 x 10  Seated Knee Extension via ball kick BLE 1 x 10 reps   Review HEP  Seated Marching 2 x 10 reps Seated Hip ABD 2 x 10 reps  *reps cues to perform slow and hold 3 sec                         PT Education - 03/12/21 1201     Education  Details therex form/technique    Person(s) Educated Patient    Methods Explanation;Demonstration    Comprehension Verbalized understanding;Returned demonstration              PT Short Term Goals - 03/08/21 1317       PT SHORT TERM GOAL #1   Title Pt will demonstrate independence with HEP to improve functional mobility for increased ability to participate with ADLs    Baseline HEP given    Time 3    Period Weeks    Status New    Target Date 03/29/21               PT Long Term Goals - 03/08/21 1318       PT LONG TERM GOAL #1   Title Patient will increase FOTO score to ** to demonstrate predicted increase in functional mobility to complete ADLs    Time 6    Period Weeks    Status New      PT LONG TERM GOAL #2   Title Patient will improve TUG to under 12 seconds to indicate signficant improvement in fall risk.     Baseline TUG: 23 sec 03/08/21    Time 6    Period Weeks    Status New    Target Date 04/19/21      PT LONG TERM GOAL #3   Title Patient will improve 237mWT to over 30014fo indicate functional improvement in walking tolerance and greater ability to walk in stores before requiring a sitting rest break    Baseline [id number] test at 1:45 sec secondary to fatigue; 03/08/21    Time 6    Period Weeks    Status New      PT LONG TERM GOAL #4   Title Pt will increase gait speed in 10MWT to 0.4m/s61m demonstrate appropriate speed for safe community ambulation    Baseline 0.62 m/s 03/08/21    Time 6    Period Weeks    Status On-going      PT LONG TERM GOAL #5   Title Patient will be able to stand from 19in surface ind to demonstrate increased bilat LE strength for safety with toilet transfers (handicap height)    Baseline 5xSTS: 50 sec from standard chair with heavy UE use needed 03/08/21    Time 6    Period Weeks    Status New  Plan - 03/12/21 1202     Clinical Impression Statement PT initiated general strength, balance, and  gait training this session. Pt tolerated session well but required frequent rest breaks between sets and demonstrates decreased standing tolerance requiring BUE support. Pt required verbal and tacile cueing for consistency with sit to stand transfers and to keep AD close to person with ambulation with decent carry over. Continue PT POC.    Personal Factors and Comorbidities Comorbidity 3+;Fitness;Age;Time since onset of injury/illness/exacerbation    Comorbidities Chronic LBP, spinal stenosis, anal prolapse, HTN    Examination-Activity Limitations Bathing;Lift;Carry;Stand;Stairs;Squat;Sit;Transfers;Bed Mobility;Locomotion Level    Examination-Participation Restrictions Cleaning;Community Activity;Other    Stability/Clinical Decision Making Evolving/Moderate complexity    Rehab Potential Fair    PT Frequency 2x / week    PT Duration 6 weeks    PT Treatment/Interventions Electrical Stimulation;Cryotherapy;Moist Heat;Ultrasound;Gait training;Stair training;Functional mobility training;Neuromuscular re-education;Balance training;Therapeutic exercise;Therapeutic activities;Patient/family education;Manual techniques;Passive range of motion;Energy conservation;Wheelchair mobility training;Dry needling    PT Next Visit Plan endurance and strengthening exercise; supine <> sit assessment, FOTO    PT Home Exercise Plan Sitting hip ABD/marching with band    Consulted and Agree with Plan of Care Patient;Family member/caregiver    Family Member Consulted Daughter             Patient will benefit from skilled therapeutic intervention in order to improve the following deficits and impairments:  Abnormal gait, Pain, Improper body mechanics, Decreased mobility, Decreased coordination, Increased muscle spasms, Postural dysfunction, Hypermobility, Decreased activity tolerance, Decreased endurance, Decreased range of motion, Decreased strength, Hypomobility, Decreased balance, Decreased safety awareness, Difficulty  walking  Visit Diagnosis: Difficulty in walking, not elsewhere classified     Problem List Patient Active Problem List   Diagnosis Date Noted   B12 deficiency 10/17/2019   Folate deficiency 10/17/2019   Anemia 08/29/2019   Iron deficiency anemia 08/26/2019   Chronic anticoagulation 10/25/2018   CKD stage G3b/A1, GFR 30-44 and albumin creatinine ratio <30 mg/g (HCC) 10/05/2018   MGUS (monoclonal gammopathy of unknown significance) 07/23/2018   Osteopenia after menopause 07/16/2018   Diabetes mellitus with no complication (HCC) 123456   Vitamin D deficiency 04/07/2018   Heart palpitations 01/06/2018   Tachycardia 01/06/2018   Cystocele with rectocele 10/30/2017   Rectocele 10/30/2017   Microcytic red blood cells 09/23/2017   Localized edema 02/13/2017   Hemorrhoids 05/17/2016   Obesity (BMI 30-39.9) 04/26/2016   Multiple lung nodules 04/19/2016   Chest pain 04/17/2016   Partial symptomatic epilepsy with complex partial seizures, not intractable, without status epilepticus ([place]) 06/30/2015   Bronchiectasis without complication ([place]) [id number]   Imbalance 11/08/2014   Essential (primary) hypertension 11/08/2014   Personal history of malignant neoplasm of breast 11/08/2014   History of malignant carcinoid tumor of bronchus and lung 11/08/2014   H/O peptic ulcer 11/08/2014   Asthma, mild intermittent 11/08/2014   Pulmonary embolism ([place]) 11/08/2014   Gonalgia 11/08/2014   Leg varices 11/08/2014     Durwin Reges DPT Sharion Settler, SPT  Durwin Reges, PT 03/13/2021, 10:20 AM  [facility] [address], [place], [address] Phone: [phone]   Fax:  [phone]  Name: Rebecca Wolf MRN: [id number] Date of Birth: [birth year]

## 2021-03-13 NOTE — Progress Notes (Signed)
Patient presents today to pick out new DM shoes.   [place] 997 ordered Size: 10 XW   Patient will be notified once shoes are received in office

## 2021-03-19 ENCOUNTER — Ambulatory Visit: Payer: Medicare Other

## 2021-03-19 DIAGNOSIS — R262 Difficulty in walking, not elsewhere classified: Secondary | ICD-10-CM

## 2021-03-19 NOTE — Therapy (Addendum)
[facility] [address], [place], [address] Phone: [phone]   Fax:  [phone]  Physical Therapy Treatment  Patient Details  Name: Rebecca Wolf MRN: [id number] Date of Birth: [birth year] Referring Provider (PT): Mike Gip MD   Encounter Date: 03/19/2021   PT End of Session - 03/19/21 1201     Visit Number 3    Number of Visits 17    Date for PT Re-Evaluation 05/04/21    Authorization - Visit Number 3    Authorization - Number of Visits 10    PT Start Time [id number]    PT Stop Time 1200    PT Time Calculation (min) 39 min    Equipment Utilized During Treatment Gait belt    Activity Tolerance Patient tolerated treatment well    Behavior During Therapy [facility] for tasks assessed/performed             Past Medical History:  Diagnosis Date   Asthma    Breast cancer ([place]) 2003   LT LUMPECTOMY   Breast cancer ([place]) 2004   LT LUMPECTOMY   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   GERD (gastroesophageal reflux disease)    Hemorrhoids    Hypertension    Personal history of chemotherapy 2004   BREAST CA   Personal history of radiation therapy 2003   BREAST CA   Personal history of radiation therapy 2004   BREAST CA   Polyp of colon 2002   bleeding polyp of right ascending colon   PUD (peptic ulcer disease)    Seizures ([place])    epilepsy well controlled    Past Surgical History:  Procedure Laterality Date   BREAST EXCISIONAL BIOPSY Left 2003   positive   BREAST EXCISIONAL BIOPSY Left 2004   positive   BREAST LUMPECTOMY Left 2003   BREAST CA   BREAST LUMPECTOMY Left 2004   BREAST CA   HEMICOLECTOMY Right    bowel obstruction   HEMORRHOID SURGERY     THORACOTOMY/LOBECTOMY  1999   carcinoid tumor   TOTAL VAGINAL HYSTERECTOMY     VARICOSE VEIN SURGERY      There were no vitals filed for this visit.   Subjective Assessment - 03/19/21 1124     Subjective Pt reports doing well  today. Pt denies any falls or pain. She reports participating in he HEP this past vist.    Patient is accompained by: Family member    Pertinent History Rebecca Wolf is a 87 y.o. female reporting to therapy with primary concerns of her balance and decreased strength, difficulty walking on uneven surfaces, and transferring from sitting to standing. Patient reports difficulty standing longer than >1 min and walking >2 min. Pt reports she ambulates with her rollator. She states that she is able to bathe and dress herself in sitting but requires assistance negotiating stairs, curbs, cleaning, and preparing food. For fun, she enjoys sitting and watching on a bench at [employer]. She reports having 1 fall in the last 6 months when her walker got stuck on an uneven cobblestone causing her to lose her balance.  Pt denies any unexplained weight fluctuation, saddle paresthesia, loss of bowel/bladder function, or unrelenting night pain at this time. She does have a history of spinal stenosis and previous brest cancer.    Limitations Lifting;Sitting;Standing;Walking;House hold activities    How long can you stand comfortably? 1 minutes    How long can you walk comfortably? 5  min            Therapeutic Exercise  Nustep level 3 x 5 min with SPM > 50  Ambulation with use of rollator -- 2 x 12f with 1 standing rest breaks lasting < 10 sec cueing to keep rollator close   Sit to standing with UE support - x10 reps  with rest break between; PT cueing to reach for handrail following stand; VC to reach back for chair with one hand before sitting to complete safe transfer   Heel raises in standing with UE support - x 20 reps   Alt Toe tapping marches on treadmill in standing with B UE support 2 x 10 reps  FWD step ups onto 4" step 1 x 5 reps leading with ea LE.    Seated Knee Extension via ball kick BLE 1 x 10 reps alternating  Seated Knee extension YTB 1 x 10 reps with cues for full ROM. Educated to  add to HEP.    PT Education - 03/19/21 1206     Education Details Rollator height and safety; pt refused    Person(s) Educated Patient    Methods Explanation;Demonstration    Comprehension Verbalized understanding;Returned demonstration              PT Short Term Goals - 03/08/21 1317       PT SHORT TERM GOAL #1   Title Pt will demonstrate independence with HEP to improve functional mobility for increased ability to participate with ADLs    Baseline HEP given    Time 3    Period Weeks    Status New    Target Date 03/29/21               PT Long Term Goals - 03/08/21 1318       PT LONG TERM GOAL #1   Title Patient will increase FOTO score to ** to demonstrate predicted increase in functional mobility to complete ADLs    Time 6    Period Weeks    Status New      PT LONG TERM GOAL #2   Title Patient will improve TUG to under 12 seconds to indicate signficant improvement in fall risk.     Baseline TUG: 23 sec 03/08/21    Time 6    Period Weeks    Status New    Target Date 04/19/21      PT LONG TERM GOAL #3   Title Patient will improve 240mWT to over [id number] indicate functional improvement in walking tolerance and greater ability to walk in stores before requiring a sitting rest break    Baseline [id number] test at 1:45 sec secondary to fatigue; 03/08/21    Time 6    Period Weeks    Status New      PT LONG TERM GOAL #4   Title Pt will increase gait speed in 10MWT to 0.45m/s17m demonstrate appropriate speed for safe community ambulation    Baseline 0.62 m/s 03/08/21    Time 6    Period Weeks    Status On-going      PT LONG TERM GOAL #5   Title Patient will be able to stand from 19in surface ind to demonstrate increased bilat LE strength for safety with toilet transfers (handicap height)    Baseline 5xSTS: 50 sec from standard chair with heavy UE use needed 03/08/21    Time 6    Period Weeks    Status New  Plan - 03/19/21 1201      Clinical Impression Statement PT continued general strengthening, balance, and gait training to improve pt functional mobility and safety. PT educated patient on rollator adjustment to prevent anterior LOB but patient refused. Pt required multimodal cueing to keep rollator close and to reach back for chair before sitting to complete transfer. Continue PT POC as able.    Personal Factors and Comorbidities Comorbidity 3+;Fitness;Age;Time since onset of injury/illness/exacerbation    Comorbidities Chronic LBP, spinal stenosis, anal prolapse, HTN    Examination-Activity Limitations Bathing;Lift;Carry;Stand;Stairs;Squat;Sit;Transfers;Bed Mobility;Locomotion Level    Examination-Participation Restrictions Cleaning;Community Activity;Other    Stability/Clinical Decision Making Evolving/Moderate complexity    Clinical Decision Making Moderate    Rehab Potential Fair    PT Frequency 2x / week    PT Duration 6 weeks    PT Treatment/Interventions Electrical Stimulation;Cryotherapy;Moist Heat;Ultrasound;Gait training;Stair training;Functional mobility training;Neuromuscular re-education;Balance training;Therapeutic exercise;Therapeutic activities;Patient/family education;Manual techniques;Passive range of motion;Energy conservation;Wheelchair mobility training;Dry needling    PT Next Visit Plan endurance and strengthening exercise; supine <> sit assessment, FOTO    PT Home Exercise Plan Sitting hip ABD/marching with band    Consulted and Agree with Plan of Care Patient;Family member/caregiver    Family Member Consulted Daughter             Patient will benefit from skilled therapeutic intervention in order to improve the following deficits and impairments:  Abnormal gait, Pain, Improper body mechanics, Decreased mobility, Decreased coordination, Increased muscle spasms, Postural dysfunction, Hypermobility, Decreased activity tolerance, Decreased endurance, Decreased range of motion, Decreased  strength, Hypomobility, Decreased balance, Decreased safety awareness, Difficulty walking  Visit Diagnosis: Difficulty in walking, not elsewhere classified     Problem List Patient Active Problem List   Diagnosis Date Noted   B12 deficiency 10/17/2019   Folate deficiency 10/17/2019   Anemia 08/29/2019   Iron deficiency anemia 08/26/2019   Chronic anticoagulation 10/25/2018   CKD stage G3b/A1, GFR 30-44 and albumin creatinine ratio <30 mg/g (HCC) 10/05/2018   MGUS (monoclonal gammopathy of unknown significance) 07/23/2018   Osteopenia after menopause 07/16/2018   Diabetes mellitus with no complication ([place]) [id number]   Vitamin D deficiency 04/07/2018   Heart palpitations 01/06/2018   Tachycardia 01/06/2018   Cystocele with rectocele 10/30/2017   Rectocele 10/30/2017   Microcytic red blood cells 09/23/2017   Localized edema 02/13/2017   Hemorrhoids 05/17/2016   Obesity (BMI 30-39.9) 04/26/2016   Multiple lung nodules 04/19/2016   Chest pain 04/17/2016   Partial symptomatic epilepsy with complex partial seizures, not intractable, without status epilepticus ([place]) 06/30/2015   Bronchiectasis without complication ([place]) [id number]   Imbalance 11/08/2014   Essential (primary) hypertension 11/08/2014   Personal history of malignant neoplasm of breast 11/08/2014   History of malignant carcinoid tumor of bronchus and lung 11/08/2014   H/O peptic ulcer 11/08/2014   Asthma, mild intermittent 11/08/2014   Pulmonary embolism ([place]) 11/08/2014   Gonalgia 11/08/2014   Leg varices 11/08/2014   Sharion Settler, SPT   Salem Caster. Fairly IV, PT, DPT Physical Therapist- [facility]  03/19/2021, 12:41 PM  [facility] [address], [place], [address] Phone: [phone]   Fax:  [phone]  Name: RETHA HARMES MRN: [id number] Date of Birth: September 19, 1924

## 2021-03-27 ENCOUNTER — Ambulatory Visit: Payer: Medicare Other | Admitting: Physical Therapy

## 2021-04-02 ENCOUNTER — Inpatient Hospital Stay: Payer: Medicare Other

## 2021-04-02 ENCOUNTER — Encounter: Payer: Self-pay | Admitting: Physical Therapy

## 2021-04-02 ENCOUNTER — Ambulatory Visit: Payer: Medicare Other | Attending: Internal Medicine | Admitting: Physical Therapy

## 2021-04-02 DIAGNOSIS — R262 Difficulty in walking, not elsewhere classified: Secondary | ICD-10-CM | POA: Diagnosis not present

## 2021-04-02 NOTE — Therapy (Signed)
[facility] [address], [place], [address] Phone: [phone]   Fax:  [phone]  Physical Therapy Treatment  Patient Details  Name: Rebecca Wolf MRN: [id number] Date of Birth: 02-Jun-1925 Referring Provider (PT): Mike Gip MD   Encounter Date: 04/02/2021   PT End of Session - 04/02/21 1133     Visit Number 4    Number of Visits 17    Date for PT Re-Evaluation 05/04/21    Authorization - Visit Number 4    Authorization - Number of Visits 10    PT Start Time 7062    PT Stop Time 3762    PT Time Calculation (min) 34 min    Equipment Utilized During Treatment Gait belt    Activity Tolerance Patient tolerated treatment well    Behavior During Therapy [facility] for tasks assessed/performed             Past Medical History:  Diagnosis Date   Asthma    Breast cancer ([place]) 2003   LT LUMPECTOMY   Breast cancer ([place]) 2004   LT LUMPECTOMY   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   GERD (gastroesophageal reflux disease)    Hemorrhoids    Hypertension    Personal history of chemotherapy 2004   BREAST CA   Personal history of radiation therapy 2003   BREAST CA   Personal history of radiation therapy 2004   BREAST CA   Polyp of colon 2002   bleeding polyp of right ascending colon   PUD (peptic ulcer disease)    Seizures ([place])    epilepsy well controlled    Past Surgical History:  Procedure Laterality Date   BREAST EXCISIONAL BIOPSY Left 2003   positive   BREAST EXCISIONAL BIOPSY Left 2004   positive   BREAST LUMPECTOMY Left 2003   BREAST CA   BREAST LUMPECTOMY Left 2004   BREAST CA   HEMICOLECTOMY Right    bowel obstruction   HEMORRHOID SURGERY     THORACOTOMY/LOBECTOMY  1999   carcinoid tumor   TOTAL VAGINAL HYSTERECTOMY     VARICOSE VEIN SURGERY      There were no vitals filed for this visit.   Subjective Assessment - 04/02/21 1135     Subjective Pt reports doing well  today. Pt denies any falls or pain since last visit. She reports active participation in [place].    Pertinent History Rebecca Wolf is a 87 y.o. female reporting to therapy with primary concerns of her balance and decreased strength, difficulty walking on uneven surfaces, and transferring from sitting to standing. Patient reports difficulty standing longer than >1 min and walking >2 min. Pt reports she ambulates with her rollator. She states that she is able to bathe and dress herself in sitting but requires assistance negotiating stairs, curbs, cleaning, and preparing food. For fun, she enjoys sitting and watching on a bench at [employer]. She reports having 1 fall in the last 6 months when her walker got stuck on an uneven cobblestone causing her to lose her balance.  Pt denies any unexplained weight fluctuation, saddle paresthesia, loss of bowel/bladder function, or unrelenting night pain at this time. She does have a history of spinal stenosis and previous brest cancer.    Limitations Lifting;Sitting;Standing;Walking;House hold activities    How long can you stand comfortably? 1 minutes    Patient Stated Goals improve balance  Ther-Ex  Nustep level1 x 5 min with SPM > 50 Seat 5 UE 12   Ambulation with use of rollator -- 2 x 131ft with 2 standing rest breaks lasting < 10 sec cueing to keep rollator close   Sit to standing with UE support - x10 reps  with rest break between; PT cueing to reach for handrail following stand; minimal VC to reach back for chair with one hand before sitting to complete safe transfer   Heel raises in standing with UE support - x 20 reps   Alt Toe tapping marches on treadmill in standing with B UE support 2 x 10 reps   Seated Heel Toes x 20 reps                           PT Education - 04/02/21 1206     Education Details therex form/technique    Person(s) Educated Patient    Methods Explanation;Demonstration     Comprehension Verbalized understanding;Returned demonstration              PT Short Term Goals - 03/08/21 1317       PT SHORT TERM GOAL #1   Title Pt will demonstrate independence with HEP to improve functional mobility for increased ability to participate with ADLs    Baseline HEP given    Time 3    Period Weeks    Status New    Target Date 03/29/21               PT Long Term Goals - 03/08/21 1318       PT LONG TERM GOAL #1   Title Patient will increase FOTO score to ** to demonstrate predicted increase in functional mobility to complete ADLs    Time 6    Period Weeks    Status New      PT LONG TERM GOAL #2   Title Patient will improve TUG to under 12 seconds to indicate signficant improvement in fall risk.     Baseline TUG: 23 sec 03/08/21    Time 6    Period Weeks    Status New    Target Date 04/19/21      PT LONG TERM GOAL #3   Title Patient will improve 9minWT to over 349ft to indicate functional improvement in walking tolerance and greater ability to walk in stores before requiring a sitting rest break    Baseline 276ft stopped test at 1:45 sec secondary to fatigue; 03/08/21    Time 6    Period Weeks    Status New      PT LONG TERM GOAL #4   Title Pt will increase gait speed in 10MWT to 0.73m/s to demonstrate appropriate speed for safe community ambulation    Baseline 0.62 m/s 03/08/21    Time 6    Period Weeks    Status On-going      PT LONG TERM GOAL #5   Title Patient will be able to stand from 19in surface ind to demonstrate increased bilat LE strength for safety with toilet transfers (handicap height)    Baseline 5xSTS: 50 sec from standard chair with heavy UE use needed 03/08/21    Time 6    Period Weeks    Status New                   Plan - 04/02/21 1204     Clinical Impression Statement PT continued general  strengthening, balance, and gait training to improve pt functional mobility and safety. PT educated patient on therex  form/technique to increase activity difficulty. Pt required multimodal cueing to keep rollator close and to reach back for chair before sitting to complete transfer. Continue PT POC as able.    Personal Factors and Comorbidities Comorbidity 3+;Fitness;Age;Time since onset of injury/illness/exacerbation    Comorbidities Chronic LBP, spinal stenosis, anal prolapse, HTN    Examination-Activity Limitations Bathing;Lift;Carry;Stand;Stairs;Squat;Sit;Transfers;Bed Mobility;Locomotion Level    Examination-Participation Restrictions Cleaning;Community Activity;Other    Stability/Clinical Decision Making Evolving/Moderate complexity    Clinical Decision Making Moderate    Rehab Potential Fair    PT Frequency 2x / week    PT Duration 6 weeks    PT Treatment/Interventions Electrical Stimulation;Cryotherapy;Moist Heat;Ultrasound;Gait training;Stair training;Functional mobility training;Neuromuscular re-education;Balance training;Therapeutic exercise;Therapeutic activities;Patient/family education;Manual techniques;Passive range of motion;Energy conservation;Wheelchair mobility training;Dry needling    PT Next Visit Plan endurance and strengthening exercise; supine <> sit assessment, FOTO    PT Home Exercise Plan Sitting hip ABD/marching with band    Consulted and Agree with Plan of Care Patient;Family member/caregiver    Family Member Consulted Daughter             Patient will benefit from skilled therapeutic intervention in order to improve the following deficits and impairments:  Abnormal gait, Pain, Improper body mechanics, Decreased mobility, Decreased coordination, Increased muscle spasms, Postural dysfunction, Hypermobility, Decreased activity tolerance, Decreased endurance, Decreased range of motion, Decreased strength, Hypomobility, Decreased balance, Decreased safety awareness, Difficulty walking  Visit Diagnosis: Difficulty in walking, not elsewhere classified     Problem List Patient  Active Problem List   Diagnosis Date Noted   B12 deficiency 10/17/2019   Folate deficiency 10/17/2019   Anemia 08/29/2019   Iron deficiency anemia 08/26/2019   Chronic anticoagulation 10/25/2018   CKD stage G3b/A1, GFR 30-44 and albumin creatinine ratio <30 mg/g (HCC) 10/05/2018   MGUS (monoclonal gammopathy of unknown significance) 07/23/2018   Osteopenia after menopause 07/16/2018   Diabetes mellitus with no complication (HCC) 70/26/3785   Vitamin D deficiency 04/07/2018   Heart palpitations 01/06/2018   Tachycardia 01/06/2018   Cystocele with rectocele 10/30/2017   Rectocele 10/30/2017   Microcytic red blood cells 09/23/2017   Localized edema 02/13/2017   Hemorrhoids 05/17/2016   Obesity (BMI 30-39.9) 04/26/2016   Multiple lung nodules 04/19/2016   Chest pain 04/17/2016   Partial symptomatic epilepsy with complex partial seizures, not intractable, without status epilepticus ([place]) 06/30/2015   Bronchiectasis without complication ([place]) 88/50/2774   Imbalance 11/08/2014   Essential (primary) hypertension 11/08/2014   Personal history of malignant neoplasm of breast 11/08/2014   History of malignant carcinoid tumor of bronchus and lung 11/08/2014   H/O peptic ulcer 11/08/2014   Asthma, mild intermittent 11/08/2014   Pulmonary embolism ([place]) 11/08/2014   Gonalgia 11/08/2014   Leg varices 11/08/2014      Durwin Reges DPT Sharion Settler, SPT  Durwin Reges, PT 04/02/2021, 2:20 PM  [facility] [address] [place], [address] Phone: [phone]   Fax:  [phone]  Name: Rebecca Wolf MRN: [id number] Date of Birth: 1924/09/27

## 2021-04-03 ENCOUNTER — Ambulatory Visit: Payer: Medicare Other

## 2021-04-09 ENCOUNTER — Ambulatory Visit: Payer: Medicare Other | Admitting: Physical Therapy

## 2021-04-16 ENCOUNTER — Ambulatory Visit: Payer: Medicare Other | Admitting: Physical Therapy

## 2021-04-16 ENCOUNTER — Encounter: Payer: Self-pay | Admitting: Physical Therapy

## 2021-04-16 DIAGNOSIS — R262 Difficulty in walking, not elsewhere classified: Secondary | ICD-10-CM

## 2021-04-16 NOTE — Therapy (Signed)
[facility] [address], [place], [address] Phone: [phone]   Fax:  [phone]  Physical Therapy Treatment  Patient Details  Name: Rebecca Wolf MRN: [id number] Date of Birth: Aug 10, 1924 Referring Provider (PT): Mike Gip MD   Encounter Date: 04/16/2021   PT End of Session - 04/16/21 1124     Visit Number 5    Number of Visits 17    Date for PT Re-Evaluation 05/04/21    Authorization - Visit Number 4    Authorization - Number of Visits 10    PT Start Time 0630    PT Stop Time 1601    PT Time Calculation (min) 39 min    Equipment Utilized During Treatment Gait belt    Activity Tolerance Patient tolerated treatment well    Behavior During Therapy [facility] for tasks assessed/performed             Past Medical History:  Diagnosis Date   Asthma    Breast cancer ([employer]) 2003   LT LUMPECTOMY   Breast cancer ([place]) 2004   LT LUMPECTOMY   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   GERD (gastroesophageal reflux disease)    Hemorrhoids    Hypertension    Personal history of chemotherapy 2004   BREAST CA   Personal history of radiation therapy 2003   BREAST CA   Personal history of radiation therapy 2004   BREAST CA   Polyp of colon 2002   bleeding polyp of right ascending colon   PUD (peptic ulcer disease)    Seizures ([place])    epilepsy well controlled    Past Surgical History:  Procedure Laterality Date   BREAST EXCISIONAL BIOPSY Left 2003   positive   BREAST EXCISIONAL BIOPSY Left 2004   positive   BREAST LUMPECTOMY Left 2003   BREAST CA   BREAST LUMPECTOMY Left 2004   BREAST CA   HEMICOLECTOMY Right    bowel obstruction   HEMORRHOID SURGERY     THORACOTOMY/LOBECTOMY  1999   carcinoid tumor   TOTAL VAGINAL HYSTERECTOMY     VARICOSE VEIN SURGERY      There were no vitals filed for this visit.   Subjective Assessment - 04/16/21 1121     Subjective Pt reports back to  therapy after having a "head cold." She denies having any pain this visit.    Pertinent History Rebecca Wolf is a 87 y.o. female reporting to therapy with primary concerns of her balance and decreased strength, difficulty walking on uneven surfaces, and transferring from sitting to standing. Patient reports difficulty standing longer than >1 min and walking >2 min. Pt reports she ambulates with her rollator. She states that she is able to bathe and dress herself in sitting but requires assistance negotiating stairs, curbs, cleaning, and preparing food. For fun, she enjoys sitting and watching on a bench at [employer]. She reports having 1 fall in the last 6 months when her walker got stuck on an uneven cobblestone causing her to lose her balance.  Pt denies any unexplained weight fluctuation, saddle paresthesia, loss of bowel/bladder function, or unrelenting night pain at this time. She does have a history of spinal stenosis and previous brest cancer.    Limitations Lifting;Sitting;Standing;Walking;House hold activities    How long can you stand comfortably? 1 minutes    How long can you walk comfortably? 5 min    Patient Stated Goals improve balance  Ther-Ex   Nustep level 2 x 5 min with SPM > 40 Seat 5 UE 12    Ambulation with use of rollator -- 1 x 190ft with 1 standing rest break lasting < 10 sec; Vcueing to keep rollator close  Seated Hip Abduction 3 x 10 reps RTB   Sit to standing with UE support - x5 reps  with rest break between; PT cueing to reach for handrail following stand; minimal VC to reach back for chair with one hand before sitting to complete safe transfer   Heel raises in standing with BUE support - 1 x 10 reps   Alt Toe tapping marches on treadmill in standing with B UE support 1 x 10 reps   Seated Heel Toes raises x 20 reps                          PT Education - 04/17/21 1529     Education Details therex form/technique     Person(s) Educated Patient    Methods Explanation;Demonstration;Verbal cues    Comprehension Verbalized understanding;Returned demonstration;Verbal cues required              PT Short Term Goals - 03/08/21 1317       PT SHORT TERM GOAL #1   Title Pt will demonstrate independence with HEP to improve functional mobility for increased ability to participate with ADLs    Baseline HEP given    Time 3    Period Weeks    Status New    Target Date 03/29/21               PT Long Term Goals - 03/08/21 1318       PT LONG TERM GOAL #1   Title Patient will increase FOTO score to ** to demonstrate predicted increase in functional mobility to complete ADLs    Time 6    Period Weeks    Status New      PT LONG TERM GOAL #2   Title Patient will improve TUG to under 12 seconds to indicate signficant improvement in fall risk.     Baseline TUG: 23 sec 03/08/21    Time 6    Period Weeks    Status New    Target Date 04/19/21      PT LONG TERM GOAL #3   Title Patient will improve 8minWT to over 394ft to indicate functional improvement in walking tolerance and greater ability to walk in stores before requiring a sitting rest break    Baseline 259ft stopped test at 1:45 sec secondary to fatigue; 03/08/21    Time 6    Period Weeks    Status New      PT LONG TERM GOAL #4   Title Pt will increase gait speed in 10MWT to 0.62m/s to demonstrate appropriate speed for safe community ambulation    Baseline 0.62 m/s 03/08/21    Time 6    Period Weeks    Status On-going      PT LONG TERM GOAL #5   Title Patient will be able to stand from 19in surface ind to demonstrate increased bilat LE strength for safety with toilet transfers (handicap height)    Baseline 5xSTS: 50 sec from standard chair with heavy UE use needed 03/08/21    Time 6    Period Weeks    Status New  Plan - 04/16/21 1211     Clinical Impression Statement Pt session limited this visit due to fatigue  following sickness. PT continued general strengthening, balance, and gait training to improve pt functional mobility and safety. Pt continues to require multimodal cueing to keep rollator close and to reach back for chair before sitting to complete transfer. Continue PT POC as able.    Personal Factors and Comorbidities Comorbidity 3+;Fitness;Age;Time since onset of injury/illness/exacerbation    Comorbidities Chronic LBP, spinal stenosis, anal prolapse, HTN    Examination-Activity Limitations Bathing;Lift;Carry;Stand;Stairs;Squat;Sit;Transfers;Bed Mobility;Locomotion Level    Examination-Participation Restrictions Cleaning;Community Activity;Other    Stability/Clinical Decision Making Evolving/Moderate complexity    Clinical Decision Making Moderate    Rehab Potential Fair    PT Frequency 2x / week    PT Duration 6 weeks    PT Treatment/Interventions Electrical Stimulation;Cryotherapy;Moist Heat;Ultrasound;Gait training;Stair training;Functional mobility training;Neuromuscular re-education;Balance training;Therapeutic exercise;Therapeutic activities;Patient/family education;Manual techniques;Passive range of motion;Energy conservation;Wheelchair mobility training;Dry needling    PT Next Visit Plan endurance and strengthening exercise; supine <> sit assessment, FOTO    PT Home Exercise Plan Sitting hip ABD/marching with band    Consulted and Agree with Plan of Care Patient;Family member/caregiver             Patient will benefit from skilled therapeutic intervention in order to improve the following deficits and impairments:  Abnormal gait, Pain, Improper body mechanics, Decreased mobility, Decreased coordination, Increased muscle spasms, Postural dysfunction, Hypermobility, Decreased activity tolerance, Decreased endurance, Decreased range of motion, Decreased strength, Hypomobility, Decreased balance, Decreased safety awareness, Difficulty walking  Visit Diagnosis: Difficulty in walking,  not elsewhere classified     Problem List Patient Active Problem List   Diagnosis Date Noted   B12 deficiency 10/17/2019   Folate deficiency 10/17/2019   Anemia 08/29/2019   Iron deficiency anemia 08/26/2019   Chronic anticoagulation 10/25/2018   CKD stage G3b/A1, GFR 30-44 and albumin creatinine ratio <30 mg/g (HCC) 10/05/2018   MGUS (monoclonal gammopathy of unknown significance) 07/23/2018   Osteopenia after menopause 07/16/2018   Diabetes mellitus with no complication (HCC) 70/17/7939   Vitamin D deficiency 04/07/2018   Heart palpitations 01/06/2018   Tachycardia 01/06/2018   Cystocele with rectocele 10/30/2017   Rectocele 10/30/2017   Microcytic red blood cells 09/23/2017   Localized edema 02/13/2017   Hemorrhoids 05/17/2016   Obesity (BMI 30-39.9) 04/26/2016   Multiple lung nodules 04/19/2016   Chest pain 04/17/2016   Partial symptomatic epilepsy with complex partial seizures, not intractable, without status epilepticus ([place]) 06/30/2015   Bronchiectasis without complication ([place]) 03/00/9233   Imbalance 11/08/2014   Essential (primary) hypertension 11/08/2014   Personal history of malignant neoplasm of breast 11/08/2014   History of malignant carcinoid tumor of bronchus and lung 11/08/2014   H/O peptic ulcer 11/08/2014   Asthma, mild intermittent 11/08/2014   Pulmonary embolism ([place]) 11/08/2014   Gonalgia 11/08/2014   Leg varices 11/08/2014     Durwin Reges DPT Sharion Settler, SPT  Durwin Reges, PT 04/17/2021, 3:32 PM  [facility] [address], [place], [address] Phone: [phone]   Fax:  [phone]  Name: Rebecca Wolf MRN: [id number] Date of Birth: [birth year]

## 2021-04-23 ENCOUNTER — Encounter: Payer: Self-pay | Admitting: Physical Therapy

## 2021-04-23 ENCOUNTER — Ambulatory Visit: Payer: Medicare Other | Admitting: Physical Therapy

## 2021-04-23 DIAGNOSIS — R262 Difficulty in walking, not elsewhere classified: Secondary | ICD-10-CM | POA: Diagnosis not present

## 2021-04-23 NOTE — Therapy (Signed)
[facility] [address], [place], [address] Phone: [phone]   Fax:  [phone]  Physical Therapy Treatment  Patient Details  Name: Rebecca Wolf MRN: [id number] Date of Birth: [birth year] Referring Provider (PT): Mike Gip MD   Encounter Date: 04/23/2021   PT End of Session - 04/23/21 1126     Visit Number 6    Number of Visits 17    Date for PT Re-Evaluation 05/04/21    Authorization - Visit Number 5    Authorization - Number of Visits 10    PT Start Time 1118    PT Stop Time 6503    PT Time Calculation (min) 38 min    Equipment Utilized During Treatment Gait belt    Activity Tolerance Patient tolerated treatment well    Behavior During Therapy [facility] for tasks assessed/performed             Past Medical History:  Diagnosis Date   Asthma    Breast cancer ([place]) 2003   LT LUMPECTOMY   Breast cancer ([place]) 2004   LT LUMPECTOMY   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   GERD (gastroesophageal reflux disease)    Hemorrhoids    Hypertension    Personal history of chemotherapy 2004   BREAST CA   Personal history of radiation therapy 2003   BREAST CA   Personal history of radiation therapy 2004   BREAST CA   Polyp of colon 2002   bleeding polyp of right ascending colon   PUD (peptic ulcer disease)    Seizures ([place])    epilepsy well controlled    Past Surgical History:  Procedure Laterality Date   BREAST EXCISIONAL BIOPSY Left 2003   positive   BREAST EXCISIONAL BIOPSY Left 2004   positive   BREAST LUMPECTOMY Left 2003   BREAST CA   BREAST LUMPECTOMY Left 2004   BREAST CA   HEMICOLECTOMY Right    bowel obstruction   HEMORRHOID SURGERY     THORACOTOMY/LOBECTOMY  1999   carcinoid tumor   TOTAL VAGINAL HYSTERECTOMY     VARICOSE VEIN SURGERY      There were no vitals filed for this visit.   Subjective Assessment - 04/23/21 1122     Subjective Pt reports feeling better  this session. She reports talking prednisone. She denies any falls or change in medical status.    Pertinent History Rebecca Wolf is a 87 y.o. female reporting to therapy with primary concerns of her balance and decreased strength, difficulty walking on uneven surfaces, and transferring from sitting to standing. Patient reports difficulty standing longer than >1 min and walking >2 min. Pt reports she ambulates with her rollator. She states that she is able to bathe and dress herself in sitting but requires assistance negotiating stairs, curbs, cleaning, and preparing food. For fun, she enjoys sitting and watching on a bench at [employer]. She reports having 1 fall in the last 6 months when her walker got stuck on an uneven cobblestone causing her to lose her balance.  Pt denies any unexplained weight fluctuation, saddle paresthesia, loss of bowel/bladder function, or unrelenting night pain at this time. She does have a history of spinal stenosis and previous brest cancer.    Limitations Lifting;Sitting;Standing;Walking;House hold activities    How long can you stand comfortably? 1 minutes    How long can you walk comfortably? 5 min    Patient Stated Goals improve balance  Ther-Ex   Nustep level 1 x 5 min with SPM > 50 Seat 5 UE 12    Ambulation with use of rollator -- 1 x 281ft with 2 standing rest break lasting < 10 sec each; Vcueing to keep rollator close   Seated alternating knee extension 3 x 10 reps 2# AW with cues for full extension   Sit to standing with UE support - 2 x 5 reps  with rest break between; PT cueing to reach for handrail following stand; minimal VC to reach back for chair with one hand before sitting to complete safe transfer   Alt Toe tapping marches on treadmill in standing with B UE support 1 x 10 reps with 2# AW    Step ups onto 4 inch step 1 x 6 reps leading with each LE with heavy reliance on UE to maintain balance on step  Seated hip ADD with  ball 3 x 20 sec                           PT Education - 04/23/21 1206     Education Details therex form/technique    Person(s) Educated Patient    Methods Explanation;Demonstration    Comprehension Verbalized understanding;Returned demonstration              PT Short Term Goals - 03/08/21 1317       PT SHORT TERM GOAL #1   Title Pt will demonstrate independence with HEP to improve functional mobility for increased ability to participate with ADLs    Baseline HEP given    Time 3    Period Weeks    Status New    Target Date 03/29/21               PT Long Term Goals - 03/08/21 1318       PT LONG TERM GOAL #1   Title Patient will increase FOTO score to ** to demonstrate predicted increase in functional mobility to complete ADLs    Time 6    Period Weeks    Status New      PT LONG TERM GOAL #2   Title Patient will improve TUG to under 12 seconds to indicate signficant improvement in fall risk.     Baseline TUG: 23 sec 03/08/21    Time 6    Period Weeks    Status New    Target Date 04/19/21      PT LONG TERM GOAL #3   Title Patient will improve 76minWT to over 379ft to indicate functional improvement in walking tolerance and greater ability to walk in stores before requiring a sitting rest break    Baseline 277ft stopped test at 1:45 sec secondary to fatigue; 03/08/21    Time 6    Period Weeks    Status New      PT LONG TERM GOAL #4   Title Pt will increase gait speed in 10MWT to 0.62m/s to demonstrate appropriate speed for safe community ambulation    Baseline 0.62 m/s 03/08/21    Time 6    Period Weeks    Status On-going      PT LONG TERM GOAL #5   Title Patient will be able to stand from 19in surface ind to demonstrate increased bilat LE strength for safety with toilet transfers (handicap height)    Baseline 5xSTS: 50 sec from standard chair with heavy UE use needed 03/08/21    Time 6  Period Weeks    Status New                    Plan - 04/23/21 1202     Clinical Impression Statement Pt tolerated session well with increased physical performance this visit. PT continued general strengthening, balance, and gait training to improve pt functional mobility and safety. Pt continues to demonstrate decreased LE strength and activity tolerance evidenced by frequent rest breaks and heavy reliance of UE on railings and rollator for support/balance. Pt continues to require multimodal cueing to keep rollator close and to reach back for chair before sitting to complete transfer. Continue PT POC as able.    Personal Factors and Comorbidities Comorbidity 3+;Fitness;Age;Time since onset of injury/illness/exacerbation    Comorbidities Chronic LBP, spinal stenosis, anal prolapse, HTN    Examination-Activity Limitations Bathing;Lift;Carry;Stand;Stairs;Squat;Sit;Transfers;Bed Mobility;Locomotion Level    Examination-Participation Restrictions Cleaning;Community Activity;Other    Stability/Clinical Decision Making Evolving/Moderate complexity    Clinical Decision Making Moderate    Rehab Potential Fair    PT Frequency 2x / week    PT Duration 6 weeks    PT Treatment/Interventions Electrical Stimulation;Cryotherapy;Moist Heat;Ultrasound;Gait training;Stair training;Functional mobility training;Neuromuscular re-education;Balance training;Therapeutic exercise;Therapeutic activities;Patient/family education;Manual techniques;Passive range of motion;Energy conservation;Wheelchair mobility training;Dry needling    PT Next Visit Plan endurance and strengthening exercise; supine <> sit assessment, FOTO    PT Home Exercise Plan Sitting hip ABD/marching with band    Consulted and Agree with Plan of Care Patient;Family member/caregiver    Family Member Consulted Daughter             Patient will benefit from skilled therapeutic intervention in order to improve the following deficits and impairments:  Abnormal gait, Pain, Improper  body mechanics, Decreased mobility, Decreased coordination, Increased muscle spasms, Postural dysfunction, Hypermobility, Decreased activity tolerance, Decreased endurance, Decreased range of motion, Decreased strength, Hypomobility, Decreased balance, Decreased safety awareness, Difficulty walking  Visit Diagnosis: Difficulty in walking, not elsewhere classified     Problem List Patient Active Problem List   Diagnosis Date Noted   B12 deficiency 10/17/2019   Folate deficiency 10/17/2019   Anemia 08/29/2019   Iron deficiency anemia 08/26/2019   Chronic anticoagulation 10/25/2018   CKD stage G3b/A1, GFR 30-44 and albumin creatinine ratio <30 mg/g (HCC) 10/05/2018   MGUS (monoclonal gammopathy of unknown significance) 07/23/2018   Osteopenia after menopause 07/16/2018   Diabetes mellitus with no complication (HCC) 87/86/7672   Vitamin D deficiency 04/07/2018   Heart palpitations 01/06/2018   Tachycardia 01/06/2018   Cystocele with rectocele 10/30/2017   Rectocele 10/30/2017   Microcytic red blood cells 09/23/2017   Localized edema 02/13/2017   Hemorrhoids 05/17/2016   Obesity (BMI 30-39.9) 04/26/2016   Multiple lung nodules 04/19/2016   Chest pain 04/17/2016   Partial symptomatic epilepsy with complex partial seizures, not intractable, without status epilepticus ([place]) 06/30/2015   Bronchiectasis without complication ([place]) 09/47/0962   Imbalance 11/08/2014   Essential (primary) hypertension 11/08/2014   Personal history of malignant neoplasm of breast 11/08/2014   History of malignant carcinoid tumor of bronchus and lung 11/08/2014   H/O peptic ulcer 11/08/2014   Asthma, mild intermittent 11/08/2014   Pulmonary embolism ([place]) 11/08/2014   Gonalgia 11/08/2014   Leg varices 11/08/2014    Durwin Reges DPT Sharion Settler, SPT  Durwin Reges, PT 04/23/2021, 3:44 PM  [facility] [address], [place], [address] Phone: [phone]   Fax:  [phone]  Name: Lyndee Herbst  Stallone MRN: [id number] Date of Birth: [birth year]

## 2021-04-30 ENCOUNTER — Ambulatory Visit: Payer: Medicare Other | Admitting: Physical Therapy

## 2021-04-30 ENCOUNTER — Encounter: Payer: Self-pay | Admitting: Physical Therapy

## 2021-04-30 DIAGNOSIS — R262 Difficulty in walking, not elsewhere classified: Secondary | ICD-10-CM

## 2021-04-30 NOTE — Therapy (Signed)
[facility] [address], [place], [address] Phone: [phone]   Fax:  [phone]  Physical Therapy Treatment  Patient Details  Name: BONNIEJEAN PIANO MRN: [id number] Date of Birth: 1924/12/19 Referring Provider (PT): Mike Gip MD   Encounter Date: 04/30/2021   PT End of Session - 04/30/21 1125     Visit Number 7    Number of Visits 17    Date for PT Re-Evaluation 05/04/21    Authorization - Visit Number 7    Authorization - Number of Visits 10    PT Start Time 1120    PT Stop Time 5885    PT Time Calculation (min) 38 min    Equipment Utilized During Treatment Gait belt    Activity Tolerance Patient tolerated treatment well    Behavior During Therapy [facility] for tasks assessed/performed             Past Medical History:  Diagnosis Date   Asthma    Breast cancer ([place]) 2003   LT LUMPECTOMY   Breast cancer ([place]) 2004   LT LUMPECTOMY   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   GERD (gastroesophageal reflux disease)    Hemorrhoids    Hypertension    Personal history of chemotherapy 2004   BREAST CA   Personal history of radiation therapy 2003   BREAST CA   Personal history of radiation therapy 2004   BREAST CA   Polyp of colon 2002   bleeding polyp of right ascending colon   PUD (peptic ulcer disease)    Seizures ([place])    epilepsy well controlled    Past Surgical History:  Procedure Laterality Date   BREAST EXCISIONAL BIOPSY Left 2003   positive   BREAST EXCISIONAL BIOPSY Left 2004   positive   BREAST LUMPECTOMY Left 2003   BREAST CA   BREAST LUMPECTOMY Left 2004   BREAST CA   HEMICOLECTOMY Right    bowel obstruction   HEMORRHOID SURGERY     THORACOTOMY/LOBECTOMY  1999   carcinoid tumor   TOTAL VAGINAL HYSTERECTOMY     VARICOSE VEIN SURGERY      There were no vitals filed for this visit.   Subjective Assessment - 04/30/21 1128     Subjective Pt reports visiting the  ED on 10/27 due to a fever. She reports feeling much better today but her and her daughter express concern for her ability to negotiate steps.    Pertinent History Devanshi Califf is a 87 y.o. female reporting to therapy with primary concerns of her balance and decreased strength, difficulty walking on uneven surfaces, and transferring from sitting to standing. Patient reports difficulty standing longer than >1 min and walking >2 min. Pt reports she ambulates with her rollator. She states that she is able to bathe and dress herself in sitting but requires assistance negotiating stairs, curbs, cleaning, and preparing food. For fun, she enjoys sitting and watching on a bench at [employer]. She reports having 1 fall in the last 6 months when her walker got stuck on an uneven cobblestone causing her to lose her balance.  Pt denies any unexplained weight fluctuation, saddle paresthesia, loss of bowel/bladder function, or unrelenting night pain at this time. She does have a history of spinal stenosis and previous brest cancer.    Limitations Lifting;Sitting;Standing;Walking;House hold activities    How long can you stand comfortably? 1 minutes    How long can you walk comfortably?  5 min    Patient Stated Goals improve balance    Pain Onset More than a month ago             Therex     Nustep level 3 x 5 min with SPM > 50 Seat 5 UE 12   Stair negotiation x 1 trial with step to pattern with heavy UE utilization on L handrail; patient unable to descend facing fwd  Step ups onto 6 inch step 1 x 6; 1 x 4 reps reps leading with each LE with heavy reliance on UE to maintain balance on step  Ambulation with use of rollator -- 1 x 113ft with 1 sitting rest break standing rest break lasting 30 sec; Vcueing to keep rollator close and reach back for chair before sitting     *adequate rest breaks given for recovery               PT Education - 04/30/21 1204     Education Details patient  educated on keeping rollator close and reaching back for chair before completing transfer.    Person(s) Educated Patient    Methods Explanation;Demonstration    Comprehension Verbalized understanding;Returned demonstration              PT Short Term Goals - 03/08/21 1317       PT SHORT TERM GOAL #1   Title Pt will demonstrate independence with HEP to improve functional mobility for increased ability to participate with ADLs    Baseline HEP given    Time 3    Period Weeks    Status New    Target Date 03/29/21               PT Long Term Goals - 03/08/21 1318       PT LONG TERM GOAL #1   Title Patient will increase FOTO score to ** to demonstrate predicted increase in functional mobility to complete ADLs    Time 6    Period Weeks    Status New      PT LONG TERM GOAL #2   Title Patient will improve TUG to under 12 seconds to indicate signficant improvement in fall risk.     Baseline TUG: 23 sec 03/08/21    Time 6    Period Weeks    Status New    Target Date 04/19/21      PT LONG TERM GOAL #3   Title Patient will improve 45minWT to over 355ft to indicate functional improvement in walking tolerance and greater ability to walk in stores before requiring a sitting rest break    Baseline 273ft stopped test at 1:45 sec secondary to fatigue; 03/08/21    Time 6    Period Weeks    Status New      PT LONG TERM GOAL #4   Title Pt will increase gait speed in 10MWT to 0.65m/s to demonstrate appropriate speed for safe community ambulation    Baseline 0.62 m/s 03/08/21    Time 6    Period Weeks    Status On-going      PT LONG TERM GOAL #5   Title Patient will be able to stand from 19in surface ind to demonstrate increased bilat LE strength for safety with toilet transfers (handicap height)    Baseline 5xSTS: 50 sec from standard chair with heavy UE use needed 03/08/21    Time 6    Period Weeks    Status New  Plan - 04/30/21 1206     Clinical  Impression Statement Pt tolerated session fair with increased exertion with exercise and decreased willingness to participate. PT continued general strengthening and stair negotiation to improve pt functional mobility and safety. Pt continues to demonstrate decreased LE strength and activity tolerance evidenced by frequent rest breaks and heavy reliance of UE on railings and rollator for support/balance. Pt continues to require multimodal cueing to keep rollator close and to reach back for chair before sitting to complete transfer. Continue PT POC as able.    Personal Factors and Comorbidities Comorbidity 3+;Fitness;Age;Time since onset of injury/illness/exacerbation    Comorbidities Chronic LBP, spinal stenosis, anal prolapse, HTN    Examination-Activity Limitations Bathing;Lift;Carry;Stand;Stairs;Squat;Sit;Transfers;Bed Mobility;Locomotion Level    Examination-Participation Restrictions Cleaning;Community Activity;Other    Stability/Clinical Decision Making Evolving/Moderate complexity    Clinical Decision Making Moderate    Rehab Potential Fair    PT Frequency 2x / week    PT Duration 6 weeks    PT Treatment/Interventions Electrical Stimulation;Cryotherapy;Moist Heat;Ultrasound;Gait training;Stair training;Functional mobility training;Neuromuscular re-education;Balance training;Therapeutic exercise;Therapeutic activities;Patient/family education;Manual techniques;Passive range of motion;Energy conservation;Wheelchair mobility training;Dry needling    PT Next Visit Plan endurance and strengthening exercise; supine <> sit assessment, FOTO    PT Home Exercise Plan Sitting hip ABD/marching with band    Consulted and Agree with Plan of Care Patient;Family member/caregiver    Family Member Consulted Daughter             Patient will benefit from skilled therapeutic intervention in order to improve the following deficits and impairments:  Abnormal gait, Pain, Improper body mechanics, Decreased  mobility, Decreased coordination, Increased muscle spasms, Postural dysfunction, Hypermobility, Decreased activity tolerance, Decreased endurance, Decreased range of motion, Decreased strength, Hypomobility, Decreased balance, Decreased safety awareness, Difficulty walking  Visit Diagnosis: Difficulty in walking, not elsewhere classified     Problem List Patient Active Problem List   Diagnosis Date Noted   B12 deficiency 10/17/2019   Folate deficiency 10/17/2019   Anemia 08/29/2019   Iron deficiency anemia 08/26/2019   Chronic anticoagulation 10/25/2018   CKD stage G3b/A1, GFR 30-44 and albumin creatinine ratio <30 mg/g (HCC) 10/05/2018   MGUS (monoclonal gammopathy of unknown significance) 07/23/2018   Osteopenia after menopause 07/16/2018   Diabetes mellitus with no complication (HCC) 81/19/1478   Vitamin D deficiency 04/07/2018   Heart palpitations 01/06/2018   Tachycardia 01/06/2018   Cystocele with rectocele 10/30/2017   Rectocele 10/30/2017   Microcytic red blood cells 09/23/2017   Localized edema 02/13/2017   Hemorrhoids 05/17/2016   Obesity (BMI 30-39.9) 04/26/2016   Multiple lung nodules 04/19/2016   Chest pain 04/17/2016   Partial symptomatic epilepsy with complex partial seizures, not intractable, without status epilepticus ([place]) 06/30/2015   Bronchiectasis without complication ([place]) 29/56/2130   Imbalance 11/08/2014   Essential (primary) hypertension 11/08/2014   Personal history of malignant neoplasm of breast 11/08/2014   History of malignant carcinoid tumor of bronchus and lung 11/08/2014   H/O peptic ulcer 11/08/2014   Asthma, mild intermittent 11/08/2014   Pulmonary embolism ([place]) 11/08/2014   Gonalgia 11/08/2014   Leg varices 11/08/2014  Chelsea Peterson DPT Sharion Settler, SPT  Sharion Settler, Student-PT 04/30/2021, 12:12 PM  [facility] [address], [place], [address] Phone:  [phone]   Fax:  [phone]  Name: MILAYAH KRELL MRN: [id number] Date of Birth: 1925/01/14

## 2021-05-09 ENCOUNTER — Ambulatory Visit: Payer: Medicare Other | Attending: Internal Medicine | Admitting: Physical Therapy

## 2021-05-09 DIAGNOSIS — R262 Difficulty in walking, not elsewhere classified: Secondary | ICD-10-CM | POA: Diagnosis present

## 2021-05-09 NOTE — Therapy (Signed)
[facility] [address] [place], [place], [address] Phone: [phone]   Fax:  [phone]  Physical Therapy Treatment  Patient Details  Name: Rebecca Wolf MRN: [id number] Date of Birth: 1924/08/14 Referring Provider (PT): Mike Gip MD   Encounter Date: 05/09/2021     Past Medical History:  Diagnosis Date   Asthma    Breast cancer ([place]) 2003   LT LUMPECTOMY   Breast cancer ([place]) 2004   LT LUMPECTOMY   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   GERD (gastroesophageal reflux disease)    Hemorrhoids    Hypertension    Personal history of chemotherapy 2004   BREAST CA   Personal history of radiation therapy 2003   BREAST CA   Personal history of radiation therapy 2004   BREAST CA   Polyp of colon 2002   bleeding polyp of right ascending colon   PUD (peptic ulcer disease)    Seizures ([place])    epilepsy well controlled    Past Surgical History:  Procedure Laterality Date   BREAST EXCISIONAL BIOPSY Left 2003   positive   BREAST EXCISIONAL BIOPSY Left 2004   positive   BREAST LUMPECTOMY Left 2003   BREAST CA   BREAST LUMPECTOMY Left 2004   BREAST CA   HEMICOLECTOMY Right    bowel obstruction   HEMORRHOID SURGERY     THORACOTOMY/LOBECTOMY  1999   carcinoid tumor   TOTAL VAGINAL HYSTERECTOMY     VARICOSE VEIN SURGERY      There were no vitals filed for this visit.    Therex  Nustep level 2 x 5 min with SPM > 50 Seat 5 UE 12    Ambulation with use of rollator -- 2 x 120ft with 1 sitting rest break lasting >30 sec each; Vcueing to keep rollator close   Sit to standing with BUE support - 2 x 6 reps  with rest break between; PT cueing to reach for handrail following stand; minimal VC to reach back for chair with one hand before sitting to complete safe transfer   Step ups onto 4 inch step 1 x 5 reps leading with each LE with heavy reliance on UE to maintain balance on step  Therapeutic  Activity  Ambulation on uneven sidewalk, ramp, and car transfer. Patient became anxious and expressed need for rest break with ambulation to vehicle. Patient became anxious, expressed need to sit, and displayed signs of SOB. SPO2 was monitored (98%). Patient required mod-max assist to transfer sit<>stand, ambulate safely approximately 50ft, and transfer into vehicle. Pt was safely assisted to vehicle where SPO2 and HR were monitored again. Vitals were in normal range.                   PT Short Term Goals - 03/08/21 1317       PT SHORT TERM GOAL #1   Title Pt will demonstrate independence with HEP to improve functional mobility for increased ability to participate with ADLs    Baseline HEP given    Time 3    Period Weeks    Status New    Target Date 03/29/21               PT Long Term Goals - 03/08/21 1318       PT LONG TERM GOAL #1   Title Patient will increase FOTO score to ** to demonstrate predicted increase in functional mobility to complete ADLs  Time 6    Period Weeks    Status New      PT LONG TERM GOAL #2   Title Patient will improve TUG to under 12 seconds to indicate signficant improvement in fall risk.     Baseline TUG: 23 sec 03/08/21    Time 6    Period Weeks    Status New    Target Date 04/19/21      PT LONG TERM GOAL #3   Title Patient will improve 10minWT to over 379ft to indicate functional improvement in walking tolerance and greater ability to walk in stores before requiring a sitting rest break    Baseline 255ft stopped test at 1:45 sec secondary to fatigue; 03/08/21    Time 6    Period Weeks    Status New      PT LONG TERM GOAL #4   Title Pt will increase gait speed in 10MWT to 0.47m/s to demonstrate appropriate speed for safe community ambulation    Baseline 0.62 m/s 03/08/21    Time 6    Period Weeks    Status On-going      PT LONG TERM GOAL #5   Title Patient will be able to stand from 19in surface ind to demonstrate increased  bilat LE strength for safety with toilet transfers (handicap height)    Baseline 5xSTS: 50 sec from standard chair with heavy UE use needed 03/08/21    Time 6    Period Weeks    Status New                     Patient will benefit from skilled therapeutic intervention in order to improve the following deficits and impairments:  Abnormal gait, Pain, Improper body mechanics, Decreased mobility, Decreased coordination, Increased muscle spasms, Postural dysfunction, Hypermobility, Decreased activity tolerance, Decreased endurance, Decreased range of motion, Decreased strength, Hypomobility, Decreased balance, Decreased safety awareness, Difficulty walking  Visit Diagnosis: Difficulty in walking, not elsewhere classified     Problem List Patient Active Problem List   Diagnosis Date Noted   B12 deficiency 10/17/2019   Folate deficiency 10/17/2019   Anemia 08/29/2019   Iron deficiency anemia 08/26/2019   Chronic anticoagulation 10/25/2018   CKD stage G3b/A1, GFR 30-44 and albumin creatinine ratio <30 mg/g (HCC) 10/05/2018   MGUS (monoclonal gammopathy of unknown significance) 07/23/2018   Osteopenia after menopause 07/16/2018   Diabetes mellitus with no complication (HCC) 09/81/1914   Vitamin D deficiency 04/07/2018   Heart palpitations 01/06/2018   Tachycardia 01/06/2018   Cystocele with rectocele 10/30/2017   Rectocele 10/30/2017   Microcytic red blood cells 09/23/2017   Localized edema 02/13/2017   Hemorrhoids 05/17/2016   Obesity (BMI 30-39.9) 04/26/2016   Multiple lung nodules 04/19/2016   Chest pain 04/17/2016   Partial symptomatic epilepsy with complex partial seizures, not intractable, without status epilepticus ([place]) 06/30/2015   Bronchiectasis without complication ([place]) 78/29/5621   Imbalance 11/08/2014   Essential (primary) hypertension 11/08/2014   Personal history of malignant neoplasm of breast 11/08/2014   History of malignant carcinoid tumor of bronchus  and lung 11/08/2014   H/O peptic ulcer 11/08/2014   Asthma, mild intermittent 11/08/2014   Pulmonary embolism ([place]) 11/08/2014   Gonalgia 11/08/2014   Leg varices 11/08/2014      Durwin Reges DPT \ Sharion Settler, SPT  Durwin Reges, PT 05/11/2021, 12:14 PM  [facility] [address] [place], [address] Phone: [phone]  Fax:  [phone]  Name: Rebecca Wolf MRN: [id number] Date of Birth: December 05, 1924

## 2021-05-16 ENCOUNTER — Ambulatory Visit: Payer: Medicare Other | Admitting: Physical Therapy

## 2021-05-22 ENCOUNTER — Other Ambulatory Visit: Payer: Self-pay

## 2021-05-22 MED ORDER — FLUAD QUADRIVALENT 0.5 ML IM PRSY
PREFILLED_SYRINGE | INTRAMUSCULAR | 0 refills | Status: DC
  Filled 2021-05-22: qty 0.5, 1d supply, fill #0

## 2021-05-23 ENCOUNTER — Ambulatory Visit: Payer: Medicare Other | Admitting: Physical Therapy

## 2021-05-30 ENCOUNTER — Encounter: Payer: Medicare Other | Admitting: Physical Therapy

## 2021-06-04 ENCOUNTER — Other Ambulatory Visit: Payer: Self-pay

## 2021-06-15 ENCOUNTER — Ambulatory Visit: Payer: Medicare Other | Admitting: Podiatry

## 2021-07-06 ENCOUNTER — Ambulatory Visit: Payer: Medicare Other | Admitting: Podiatry

## 2021-07-20 ENCOUNTER — Ambulatory Visit: Payer: Medicare Other | Admitting: Podiatry

## 2021-08-15 ENCOUNTER — Other Ambulatory Visit: Payer: Self-pay | Admitting: *Deleted

## 2021-08-15 DIAGNOSIS — D509 Iron deficiency anemia, unspecified: Secondary | ICD-10-CM

## 2021-08-17 ENCOUNTER — Ambulatory Visit: Payer: Medicare Other | Admitting: Podiatry

## 2021-08-31 ENCOUNTER — Telehealth: Payer: Self-pay | Admitting: Oncology

## 2021-08-31 NOTE — Telephone Encounter (Signed)
Left VM with patient's daughter and let her know that per a referral received from Dr. Mickel Duhamel. Rebecca Wolf would like her to come in next week (move existing appointment up)to repeat lab work, see the MD and possibly receive an iron infusion. Requested she call back.  ?

## 2021-09-04 ENCOUNTER — Inpatient Hospital Stay: Payer: Medicare Other | Attending: Oncology

## 2021-09-04 ENCOUNTER — Other Ambulatory Visit: Payer: Self-pay

## 2021-09-04 ENCOUNTER — Encounter: Payer: Self-pay | Admitting: Nurse Practitioner

## 2021-09-04 ENCOUNTER — Inpatient Hospital Stay (HOSPITAL_BASED_OUTPATIENT_CLINIC_OR_DEPARTMENT_OTHER): Payer: Medicare Other | Admitting: Nurse Practitioner

## 2021-09-04 ENCOUNTER — Inpatient Hospital Stay: Payer: Medicare Other

## 2021-09-04 VITALS — BP 119/52 | HR 66 | Temp 97.9°F | Resp 18

## 2021-09-04 VITALS — BP 119/52 | HR 66 | Temp 97.9°F | Resp 18 | Wt 172.0 lb

## 2021-09-04 DIAGNOSIS — D509 Iron deficiency anemia, unspecified: Secondary | ICD-10-CM

## 2021-09-04 DIAGNOSIS — E538 Deficiency of other specified B group vitamins: Secondary | ICD-10-CM

## 2021-09-04 LAB — CBC
HCT: 30.1 % — ABNORMAL LOW (ref 36.0–46.0)
Hemoglobin: 8.9 g/dL — ABNORMAL LOW (ref 12.0–15.0)
MCH: 21.3 pg — ABNORMAL LOW (ref 26.0–34.0)
MCHC: 29.6 g/dL — ABNORMAL LOW (ref 30.0–36.0)
MCV: 72.2 fL — ABNORMAL LOW (ref 80.0–100.0)
Platelets: 256 10*3/uL (ref 150–400)
RBC: 4.17 MIL/uL (ref 3.87–5.11)
RDW: 15.7 % — ABNORMAL HIGH (ref 11.5–15.5)
WBC: 7.2 10*3/uL (ref 4.0–10.5)
nRBC: 0 % (ref 0.0–0.2)

## 2021-09-04 LAB — IRON AND TIBC
Iron: 31 ug/dL (ref 28–170)
Saturation Ratios: 8 % — ABNORMAL LOW (ref 10.4–31.8)
TIBC: 413 ug/dL (ref 250–450)
UIBC: 382 ug/dL

## 2021-09-04 LAB — FOLATE: Folate: 7.8 ng/mL (ref >5.9)

## 2021-09-04 LAB — FERRITIN: Ferritin: 12 ng/mL (ref 11–307)

## 2021-09-04 MED ORDER — SODIUM CHLORIDE 0.9 % IV SOLN: 200.0000 mg | Freq: Once | INTRAVENOUS | Status: DC

## 2021-09-04 MED ORDER — IRON SUCROSE 20 MG/ML IV SOLN
200.0000 mg | Freq: Once | INTRAVENOUS | Status: AC
  Administered 2021-09-04: 200 mg via INTRAVENOUS
  Filled 2021-09-04: qty 10

## 2021-09-04 MED ORDER — CYANOCOBALAMIN 1000 MCG/ML IJ SOLN
1000.0000 ug | Freq: Once | INTRAMUSCULAR | Status: AC
  Administered 2021-09-04: 1000 ug via INTRAMUSCULAR
  Filled 2021-09-04: qty 1

## 2021-09-04 MED ORDER — SODIUM CHLORIDE 0.9 % IV SOLN
INTRAVENOUS | Status: DC
  Filled 2021-09-04: qty 250

## 2021-09-04 NOTE — Progress Notes (Signed)
Pt in for follow up and iron infusion.  Pt and caregiver reports pt has been very fatigued and tired.  Caregiver reports changes in med list, RN reviewed and updated record.   ?

## 2021-09-04 NOTE — Progress Notes (Signed)
Rebecca Wolf, [place] Phone: [phone]   Referring physician: Erline Levine, MD  Chief Complaint: Rebecca Wolf is a 88 y.o. female  with a history of breast cancer, carcinoid tumor, and DVT/PE who is seen for 6 month assessment.   HPI:   Rebecca Wolf is a 88 y.o. female with a history of breast cancer, carcinoid tumor, and DVT/PE.   She was diagnosed with precancerous breast lesion (? DCIS) in 2003.  She underwent lumpectomy.  She developed invasive lobular carcinoma in 2007. She underwent segmental mastectomy and sentinel lymph node biopsy followed by radiation.  No lymph nodes were involved.  Tumor was ER/PR positive.  She completed 5 years of Arimidex in 08/2011. Mammogram on 10/16/2016 was negative.  CA27.29 was 20.7 (normal) on 09/24/2016 and 12.9 on 08/25/2019.   She was diagnosed with pulmonary carcinoid in 2001 after presenting with an abnormality on chest CT.  She was asymptomatic.  She underwent resection.  She has had no evidence of recurrent disease.   She has a history of DVT x 2 and pulmonary embolism x 2.  She has been on anticoagulation for 8-10 years.  Chest CT angiogram on 06/29/2017 revealed no evidence of significant pulmonary embolus.  There was an unchanged appearance of right paratracheal nodule and multiple pulmonary nodules.  She was initially on Coumadin, but switched to Eliquis in 2016.  She is unaware of any hypercoagulable work-up.    She has a history of GI bleeding in 2002.  She is s/p partial right ascending hemicolectomy for a bleeding polyp.  She has a history of reflux and peptic ulcer disease.  Hematocrit is normal (37.8).  MCV is microcytic.  While in [place], a hemoglobin electrophoresis was performed (no result available).  She was started on oral on 08/31/2012.  She has been receiving IV iron (Venofer).   She has B12 deficiency.  B12 was 157 on 08/25/2019 and 270 on 10/15/2019.  She was briefly oral B12 (discontinued secondary  to diarrhea).  She began B12 injections on 09/20/2019.   She has folate deficiency.  Folate was 5.1 on 08/25/2019, 6.8 on 09/13/2019 and 13.6 on 10/15/2019.  She is on oral folate.  She has a history of a monoclonal gammopathy of unknown significance (MGUS).  M-spike was 0 on 08/25/2019 and 01/10/2020.   Interval History: Patient is 88 year old female who returns to clinic for labs, further evaluation, and consideration of IV iron and b12. She reports fatigue. Denies any neurologic complaints. Denies recent fevers or illnesses. Denies any easy bleeding or bruising. No melena or hematochezia. No pica or restless leg. Reports good appetite and denies weight loss. Denies chest pain. Denies any nausea, vomiting, constipation, or diarrhea. Denies urinary complaints. Patient offers no further specific complaints today.   Past Medical History:  Diagnosis Date   Asthma    Breast cancer ([place]) 2003   LT LUMPECTOMY   Breast cancer ([place]) 2004   LT LUMPECTOMY   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   GERD (gastroesophageal reflux disease)    Hemorrhoids    Hypertension    Personal history of chemotherapy 2004   BREAST CA   Personal history of radiation therapy 2003   BREAST CA   Personal history of radiation therapy 2004   BREAST CA   Polyp of colon 2002   bleeding polyp of right ascending colon   PUD (peptic ulcer disease)    Seizures ([place])    epilepsy well  controlled    Past Surgical History:  Procedure Laterality Date   BREAST EXCISIONAL BIOPSY Left 2003   positive   BREAST EXCISIONAL BIOPSY Left 2004   positive   BREAST LUMPECTOMY Left 2003   BREAST CA   BREAST LUMPECTOMY Left 2004   BREAST CA   HEMICOLECTOMY Right    bowel obstruction   HEMORRHOID SURGERY     THORACOTOMY/LOBECTOMY  1999   carcinoid tumor   TOTAL VAGINAL HYSTERECTOMY     VARICOSE VEIN SURGERY      Family History  Problem Relation Age of Onset   Alcoholism Father    Diabetes Sister    Breast  cancer Neg Hx     Social History:  reports that she has quit smoking. She has never used smokeless tobacco. She reports that she does not drink alcohol and does not use drugs. She has a daughter named Rebecca Wolf. The patient is accompanied by her daughter, Rebecca Wolf, today.   Allergies:  Allergies  Allergen Reactions   Levofloxacin Shortness Of Breath   Sulfa Antibiotics Rash    Current Medications: Current Outpatient Medications  Medication Sig Dispense Refill   acetaminophen (TYLENOL) 500 MG tablet Take 500 mg by mouth every 6 (six) hours as needed.     albuterol (ACCUNEB) 1.25 MG/3ML nebulizer solution Inhale 1 ampule into the lungs 4 (four) times daily as needed.     amLODipine (NORVASC) 5 MG tablet TAKE 1 TABLET BY MOUTH EVERY DAY (Patient taking differently: Take 2.5 mg by mouth daily.) 90 tablet 0   azelastine (ASTELIN) 0.1 % nasal spray U 1 SPRAY IEN BID TO REPLACE PATANASE     azithromycin (ZITHROMAX) 250 MG tablet Take 250 mg by mouth daily.     budesonide (PULMICORT) 0.5 MG/2ML nebulizer solution Inhale 0.5 mg into the lungs 2 (two) times daily as needed.     Cholecalciferol 25 MCG (1000 UT) tablet Take 1,000 Units by mouth daily.  (Patient not taking: Reported on 10/19/2020)     COVID-19 mRNA Vac-TriS, Pfizer, (PFIZER-BIONT COVID-19 VAC-TRIS) SUSP injection Inject into the muscle. 0.3 mL 0   diltiazem (CARDIZEM LA) 180 MG 24 hr tablet Take 180 mg by mouth daily.     ELIQUIS 2.5 MG TABS tablet TAKE 1 TABLET BY MOUTH TWICE DAILY 60 tablet 5   fluticasone (FLONASE) 50 MCG/ACT nasal spray Place 2 sprays into the nose daily.     Fluticasone-Salmeterol (ADVAIR) 500-50 MCG/DOSE AEPB Inhale 1 puff into the lungs 2 (two) times daily. (Patient not taking: No sig reported)     furosemide (LASIX) 20 MG tablet every other day.     influenza vaccine adjuvanted (FLUAD QUADRIVALENT) 0.5 ML injection Inject into the muscle. 0.5 mL 0   levalbuterol (XOPENEX) 0.63 MG/3ML nebulizer solution VVN Q 4  H PRN FOR WHZ     levETIRAcetam (KEPPRA) 750 MG tablet Take 1 tablet by mouth 2 (two) times daily.     lidocaine (LIDODERM) 5 % Place 1 patch onto the skin every 12 (twelve) hours. Remove & Discard patch within 12 hours or as directed by MD 10 patch 0   montelukast (SINGULAIR) 10 MG tablet Take 1 tablet by mouth daily.     Olopatadine HCl 0.6 % SOLN Place 1 puff into the nose 2 (two) times daily. (Patient not taking: No sig reported)     pantoprazole (PROTONIX) 40 MG tablet TAKE 1 TABLET BY MOUTH TWICE DAILY (Patient taking differently: daily.) 60 tablet 0   prednisoLONE acetate (PRED FORTE)  1 % ophthalmic suspension INTILL 2 GTS INTO OU QID  0   predniSONE (DELTASONE) 2.5 MG tablet Take 2.5 mg by mouth daily with breakfast.     spironolactone (ALDACTONE) 25 MG tablet Take 1 tablet by mouth daily.     spironolactone-hydrochlorothiazide (ALDACTAZIDE) 25-25 MG tablet TAKE 1 TABLET BY MOUTH DAILY (Patient not taking: Reported on 10/19/2020) 90 tablet 0   sucralfate (CARAFATE) 1 g tablet Take 1 tablet (1 g total) by mouth 4 (four) times daily. 352 tablet 2   No current facility-administered medications for this visit.    Review of Systems  Constitutional:  Negative for chills, fever, malaise/fatigue and weight loss.  HENT:  Negative for hearing loss, nosebleeds, sore throat and tinnitus.   Eyes:  Negative for blurred vision and double vision.  Respiratory:  Negative for cough, hemoptysis, shortness of breath and wheezing.   Cardiovascular:  Negative for chest pain, palpitations and leg swelling.  Gastrointestinal:  Negative for abdominal pain, blood in stool, constipation, diarrhea, melena, nausea and vomiting.  Genitourinary:  Negative for dysuria and urgency.  Musculoskeletal:  Negative for back pain, falls, joint pain and myalgias.  Skin:  Negative for itching and rash.  Neurological:  Negative for dizziness, tingling, sensory change, loss of consciousness, weakness and headaches.   Endo/Heme/Allergies:  Negative for environmental allergies. Does not bruise/bleed easily.  Psychiatric/Behavioral:  Negative for depression. The patient is not nervous/anxious and does not have insomnia.    Performance status (ECOG):  2  Physical Exam Constitutional:      Appearance: She is not ill-appearing.  Pulmonary:     Effort: No respiratory distress.  Abdominal:     General: There is no distension.     Tenderness: There is no abdominal tenderness.  Skin:    General: Skin is dry.     Coloration: Skin is not pale.  Neurological:     Mental Status: She is alert and oriented to person, place, and time.  Psychiatric:        Mood and Affect: Mood normal.        Behavior: Behavior normal.   CBC Latest Ref Rng & Units 09/04/2021 01/31/2021 10/18/2020  WBC 4.0 - 10.5 K/uL 7.2 7.2 5.8  Hemoglobin 12.0 - 15.0 g/dL 8.9(L) 11.1(L) 10.9(L)  Hematocrit 36.0 - 46.0 % 30.1(L) 34.6(L) 34.5(L)  Platelets 150 - 400 K/uL 256 185 236   CMP Latest Ref Rng & Units 01/31/2021 08/25/2019 09/23/2017  Glucose 70 - 99 mg/dL 128(H) 163(H) 171(H)  BUN 8 - 23 mg/dL 16 19 21(H)  Creatinine 0.44 - 1.00 mg/dL 0.86 1.10(H) 1.07(H)  Sodium 135 - 145 mmol/L 139 136 136  Potassium 3.5 - 5.1 mmol/L 3.9 3.5 3.8  Chloride 98 - 111 mmol/L 106 103 105  CO2 22 - 32 mmol/L 26 19(L) 18(L)  Calcium 8.9 - 10.3 mg/dL 9.1 8.5(L) 9.1  Total Protein 6.5 - 8.1 g/dL 6.6 7.0 8.2(H)  Total Bilirubin 0.3 - 1.2 mg/dL 0.5 0.4 0.5  Alkaline Phos 38 - 126 U/L 97 81 96  AST 15 - 41 U/L 15 19 40  ALT 0 - 44 U/L '13 13 27   '$ Iron/TIBC/Ferritin/ %Sat    Component Value Date/Time   IRON 57 10/18/2020 1122   TIBC 354 10/18/2020 1122   FERRITIN 131 10/18/2020 1122   IRONPCTSAT 16 10/18/2020 1122   Lab Results  Component Value Date   VITAMINB12 270 10/15/2019     Assessment & Plan: 1.   Iron deficiency anemia- possibly  related to GI bleed but unable to have endoscopy or colonoscopy due to age and comorbidities. hemoglobin 8.9 with  associated microcytosis. Iron studies pending at time of visit. Ferritin from pcp was previously 16. Iron sat 11%. Consistent with iron deficiency. Recommend venofer x 4.   2.   B12 and folate deficiency- pending today. B12 was 597 with PCP in February. Restart monthly b12 injections. Plan to check b12 every 6 months. Folate pending today. She will continue oral folic acid supplementation  3. History of invasive lobular cancer- Mammograms discontinued d/t age and comorbidities. Clinically, asymptomatic.    4.   Carcinoid syndrome- Clinically, asymptomatic. Monitor.   5.   Recurrent DVT and pulmonary embolism- on eliquis. Continue evaluating risk vs benefit with pcp.   6.   History of MGUS- monitored annually by [facility].   Disposition:  Venofer x 4  B12 monthly 6 months- labs (cbc, cmp, ferritin, iron studies, b12) Day to week later see MD (ok for virtual if pt prefers). If in person add +/- venofer & b12- la  I discussed the assessment and treatment plan with the patient.  The patient was provided an opportunity to ask questions and all were answered.  The patient agreed with the plan and demonstrated an understanding of the instructions.  The patient was advised to call back or seek an in person evaluation if the symptoms worsen or if the condition fails to improve as anticipated.  Beckey Rutter, DNP, AGNP-C [facility] [phone] (clinic) 09/04/2021  CC: Dr. Loraine Maple

## 2021-09-11 ENCOUNTER — Other Ambulatory Visit: Payer: Self-pay

## 2021-09-11 ENCOUNTER — Inpatient Hospital Stay: Payer: Medicare Other

## 2021-09-11 VITALS — BP 129/83 | HR 81 | Temp 97.2°F | Resp 18

## 2021-09-11 DIAGNOSIS — D509 Iron deficiency anemia, unspecified: Secondary | ICD-10-CM

## 2021-09-11 MED ORDER — IRON SUCROSE 20 MG/ML IV SOLN
200.0000 mg | Freq: Once | INTRAVENOUS | Status: AC
  Administered 2021-09-11: 200 mg via INTRAVENOUS
  Filled 2021-09-11: qty 10

## 2021-09-11 MED ORDER — SODIUM CHLORIDE 0.9 % IV SOLN
Freq: Once | INTRAVENOUS | Status: AC
  Filled 2021-09-11: qty 250

## 2021-09-11 MED ORDER — SODIUM CHLORIDE 0.9 % IV SOLN: 200.0000 mg | Freq: Once | INTRAVENOUS | Status: DC

## 2021-09-11 NOTE — Patient Instructions (Signed)

## 2021-09-11 NOTE — Progress Notes (Signed)
Pt received 9-9.78ms out of 117mof Venofer prior to losing IV access. 3 unsuccessful attempts made, unable to obtain IV access for remaining 0.5-13m313mLauBeckey Rutter made aware. Pt stable at discharge.   ?

## 2021-09-17 ENCOUNTER — Other Ambulatory Visit: Payer: Medicare Other

## 2021-09-17 ENCOUNTER — Telehealth: Payer: Self-pay | Admitting: Nurse Practitioner

## 2021-09-17 ENCOUNTER — Ambulatory Visit: Payer: Medicare Other | Admitting: Oncology

## 2021-09-17 ENCOUNTER — Other Ambulatory Visit: Payer: Self-pay

## 2021-09-17 ENCOUNTER — Inpatient Hospital Stay: Payer: Medicare Other

## 2021-09-17 ENCOUNTER — Ambulatory Visit: Payer: Medicare Other

## 2021-09-17 DIAGNOSIS — I878 Other specified disorders of veins: Secondary | ICD-10-CM

## 2021-09-17 NOTE — Progress Notes (Signed)
Unable to obtain IV access for venofer. 2 unsuccessful attempts. Spoke with daughter. Beckey Rutter NP to call patient's daughter later today and discuss treatment options. Discharged home with daughter ?

## 2021-09-17 NOTE — Telephone Encounter (Signed)
Received message to speak to [name], patient's daughter. At last infusion, patient had more IV access, required several attempts. Patient's daughter says she has 2 hematomas as a result of attempts. Also concerned that patient remained in wheelchair for visit which was uncomfortable, prefers recliner/cushioned chair. We discussed other options for IV access including placement of port, refusing IV at any point. I offered to personally attempt IV if patient prefers but will also communicate patient's poor access to charge nurse who may be able to assist. Patient's daughter thanked for call and concern. She believes her mother would not wish to undergo any invasive procedures such as port. Advised that if she is unable to take IV iron we can discuss alternatives. She would like to plan for infusion as scheduled.  ?

## 2021-09-18 ENCOUNTER — Telehealth: Payer: Self-pay | Admitting: Nurse Practitioner

## 2021-09-18 DIAGNOSIS — I878 Other specified disorders of veins: Secondary | ICD-10-CM

## 2021-09-18 NOTE — Telephone Encounter (Signed)
Spoke to [employer] by phone. Nursing was unable to get IV access yesterday. Attempted twice. Patient declined additional attempts. [name] would like to encourage oral hydration and plans to speak to cardiology about reducing lasix dosing in hopes that this will improve access and that patient can get infusions. Requests we keep appointments as scheduled. Will forward copy & addended last note to PCP at patient's request.  ?

## 2021-09-25 ENCOUNTER — Inpatient Hospital Stay: Payer: Medicare Other

## 2021-09-25 ENCOUNTER — Other Ambulatory Visit: Payer: Self-pay

## 2021-09-25 VITALS — BP 116/55 | HR 75 | Temp 96.0°F | Resp 20

## 2021-09-25 DIAGNOSIS — D509 Iron deficiency anemia, unspecified: Secondary | ICD-10-CM

## 2021-09-25 MED ORDER — SODIUM CHLORIDE 0.9 % IV SOLN
INTRAVENOUS | Status: DC
  Filled 2021-09-25: qty 250

## 2021-09-25 MED ORDER — SODIUM CHLORIDE 0.9 % IV SOLN: 200.0000 mg | Freq: Once | INTRAVENOUS | Status: DC

## 2021-09-25 MED ORDER — IRON SUCROSE 20 MG/ML IV SOLN
200.0000 mg | Freq: Once | INTRAVENOUS | Status: AC
  Administered 2021-09-25: 200 mg via INTRAVENOUS
  Filled 2021-09-25: qty 10

## 2021-09-25 NOTE — Patient Instructions (Signed)

## 2021-10-02 ENCOUNTER — Inpatient Hospital Stay: Payer: Medicare Other | Attending: Oncology

## 2021-10-02 VITALS — BP 131/65 | HR 85 | Temp 97.5°F | Resp 18

## 2021-10-02 DIAGNOSIS — D509 Iron deficiency anemia, unspecified: Secondary | ICD-10-CM | POA: Diagnosis present

## 2021-10-02 DIAGNOSIS — E538 Deficiency of other specified B group vitamins: Secondary | ICD-10-CM | POA: Insufficient documentation

## 2021-10-02 MED ORDER — SODIUM CHLORIDE 0.9 % IV SOLN
INTRAVENOUS | Status: DC
  Filled 2021-10-02: qty 250

## 2021-10-02 MED ORDER — SODIUM CHLORIDE 0.9 % IV SOLN: 200.0000 mg | Freq: Once | INTRAVENOUS | Status: DC

## 2021-10-02 MED ORDER — IRON SUCROSE 20 MG/ML IV SOLN
200.0000 mg | Freq: Once | INTRAVENOUS | Status: AC
  Administered 2021-10-02: 200 mg via INTRAVENOUS
  Filled 2021-10-02: qty 10

## 2021-10-02 MED ORDER — CYANOCOBALAMIN 1000 MCG/ML IJ SOLN
1000.0000 ug | Freq: Once | INTRAMUSCULAR | Status: AC
  Administered 2021-10-02: 1000 ug via INTRAMUSCULAR
  Filled 2021-10-02: qty 1

## 2021-10-02 NOTE — Patient Instructions (Signed)
[facility] AT [place]  Discharge Instructions: ?Thank you for choosing [facility] to provide your oncology and hematology care.  ?If you have a lab appointment with the [facility], please go directly to the [facility] and check in at the registration area. ? ?Wear comfortable clothing and clothing appropriate for easy access to any Portacath or PICC line.  ? ?We strive to give you quality time with your provider. You may need to reschedule your appointment if you arrive late (15 or more minutes).  Arriving late affects you and other patients whose appointments are after yours.  Also, if you miss three or more appointments without notifying the office, you may be dismissed from the clinic at the provider?s discretion.    ?  ?For prescription refill requests, have your pharmacy contact our office and allow 72 hours for refills to be completed.   ? ?Today you received the following chemotherapy and/or immunotherapy agents VENOFER and VITAMIN B 12    ?  ?To help prevent nausea and vomiting after your treatment, we encourage you to take your nausea medication as directed. ? ?BELOW ARE SYMPTOMS THAT SHOULD BE REPORTED IMMEDIATELY: ?*FEVER GREATER THAN 100.4 F (38 ?C) OR HIGHER ?*CHILLS OR SWEATING ?*NAUSEA AND VOMITING THAT IS NOT CONTROLLED WITH YOUR NAUSEA MEDICATION ?*UNUSUAL SHORTNESS OF BREATH ?*UNUSUAL BRUISING OR BLEEDING ?*URINARY PROBLEMS (pain or burning when urinating, or frequent urination) ?*BOWEL PROBLEMS (unusual diarrhea, constipation, pain near the anus) ?TENDERNESS IN MOUTH AND THROAT WITH OR WITHOUT PRESENCE OF ULCERS (sore throat, sores in mouth, or a toothache) ?UNUSUAL RASH, SWELLING OR PAIN  ?UNUSUAL VAGINAL DISCHARGE OR ITCHING  ? ?Items with * indicate a potential emergency and should be followed up as soon as possible or go to the Emergency Department if any problems should occur. ? ?Please show the CHEMOTHERAPY ALERT CARD or IMMUNOTHERAPY ALERT CARD  at check-in to the Emergency Department and triage nurse. ? ?Should you have questions after your visit or need to cancel or reschedule your appointment, please contact [facility] CANCER [place] AT [place]  [phone] and follow the prompts.  Office hours are 8:00 a.m. to 4:30 p.m. Monday - Friday. Please note that voicemails left after 4:00 p.m. may not be returned until the following business day.  We are closed weekends and major holidays. You have access to a nurse at all times for urgent questions. Please call the main number to the clinic [phone] and follow the prompts. ? ?For any non-urgent questions, you may also contact your provider using MyChart. We now offer e-Visits for anyone 64 and older to request care online for non-urgent symptoms. For details visit [email or web address]. ?  ?Also download the MyChart app! Go to the app store, search "MyChart", open the app, select [facility], and log in with your MyChart username and password. ? ?Due to Covid, a mask is required upon entering the hospital/clinic. If you do not have a mask, one will be given to you upon arrival. For doctor visits, patients may have 1 support person aged 41 or older with them. For treatment visits, patients cannot have anyone with them due to current Covid guidelines and our immunocompromised population.  ? ?Iron Sucrose Injection ?What is this medication? ?IRON SUCROSE (EYE ern SOO krose) treats low levels of iron (iron deficiency anemia) in people with kidney disease. Iron is a mineral that plays an important role in making red blood cells, which carry oxygen from your lungs to the rest of your  body. ?This medicine may be used for other purposes; ask your health care provider or pharmacist if you have questions. ?COMMON BRAND NAME(S): Venofer ?What should I tell my care team before I take this medication? ?They need to know if you have any of these conditions: ?Anemia not caused by low iron levels ?Heart  disease ?High levels of iron in the blood ?Kidney disease ?Liver disease ?An unusual or allergic reaction to iron, other medications, foods, dyes, or preservatives ?Pregnant or trying to get pregnant ?Breast-feeding ?How should I use this medication? ?This medication is for infusion into a vein. It is given in a hospital or clinic setting. ?Talk to your care team about the use of this medication in children. While this medication may be prescribed for children as young as 2 years for selected conditions, precautions do apply. ?Overdosage: If you think you have taken too much of this medicine contact a poison control center or emergency room at once. ?NOTE: This medicine is only for you. Do not share this medicine with others. ?What if I miss a dose? ?It is important not to miss your dose. Call your care team if you are unable to keep an appointment. ?What may interact with this medication? ?Do not take this medication with any of the following: ?Deferoxamine ?Dimercaprol ?Other iron products ?This medication may also interact with the following: ?Chloramphenicol ?Deferasirox ?This list may not describe all possible interactions. Give your health care provider a list of all the medicines, herbs, non-prescription drugs, or dietary supplements you use. Also tell them if you smoke, drink alcohol, or use illegal drugs. Some items may interact with your medicine. ?What should I watch for while using this medication? ?Visit your care team regularly. Tell your care team if your symptoms do not start to get better or if they get worse. You may need blood work done while you are taking this medication. ?You may need to follow a special diet. Talk to your care team. Foods that contain iron include: whole grains/cereals, dried fruits, beans, or peas, leafy green vegetables, and organ meats (liver, kidney). ?What side effects may I notice from receiving this medication? ?Side effects that you should report to your care team as  soon as possible: ?Allergic reactions--skin rash, itching, hives, swelling of the face, lips, tongue, or throat ?Low blood pressure--dizziness, feeling faint or lightheaded, blurry vision ?Shortness of breath ?Side effects that usually do not require medical attention (report to your care team if they continue or are bothersome): ?Flushing ?Headache ?Joint pain ?Muscle pain ?Nausea ?Pain, redness, or irritation at injection site ?This list may not describe all possible side effects. Call your doctor for medical advice about side effects. You may report side effects to FDA at 1-800-FDA-1088. ?Where should I keep my medication? ?This medication is given in a hospital or clinic and will not be stored at home. ?NOTE: This sheet is a summary. It may not cover all possible information. If you have questions about this medicine, talk to your doctor, pharmacist, or health care provider. ?? 2022 Elsevier/Gold Standard (2020-11-10 00:00:00) ? ?Vitamin B12 Injection ?What is this medication? ?Vitamin B12 (VAHY tuh min B12) prevents and treats low vitamin B12 levels in your body. It is used in people who do not get enough vitamin B12 from their diet or when their digestive tract does not absorb enough. Vitamin B12 plays an important role in maintaining the health of your nervous system and red blood cells. ?This medicine may be used for other  purposes; ask your health care provider or pharmacist if you have questions. ?COMMON BRAND NAME(S): B-12 Compliance Kit, B-12 Injection Kit, Cyomin, Dodex, LA-12, Nutri-Twelve, Physicians EZ Use B-12, Primabalt ?What should I tell my care team before I take this medication? ?They need to know if you have any of these conditions: ?Kidney disease ?Leber's disease ?Megaloblastic anemia ?An unusual or allergic reaction to cyanocobalamin, cobalt, other medications, foods, dyes, or preservatives ?Pregnant or trying to get pregnant ?Breast-feeding ?How should I use this medication? ?This  medication is injected into a muscle or deeply under the skin. It is usually given in a clinic or care team's office. However, your care team may teach you how to inject yourself. Follow all instructions. ?Talk to your

## 2021-10-12 ENCOUNTER — Ambulatory Visit: Payer: Medicare Other | Admitting: Podiatry

## 2021-10-30 ENCOUNTER — Ambulatory Visit (INDEPENDENT_AMBULATORY_CARE_PROVIDER_SITE_OTHER): Payer: Medicare Other | Admitting: Podiatry

## 2021-10-30 DIAGNOSIS — M79676 Pain in unspecified toe(s): Secondary | ICD-10-CM | POA: Diagnosis not present

## 2021-10-30 DIAGNOSIS — B351 Tinea unguium: Secondary | ICD-10-CM

## 2021-10-30 NOTE — Progress Notes (Signed)
? ?  SUBJECTIVE ?Patient presents to office today complaining of elongated, thickened nails that cause pain while ambulating in shoes.  Patient is unable to trim their own nails. Patient is here for further evaluation and treatment. ? ?Past Medical History:  ?Diagnosis Date  ? Asthma   ? Breast cancer ([place]) 2003  ? LT LUMPECTOMY  ? Breast cancer ([place]) 2004  ? LT LUMPECTOMY  ? 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  ? GERD (gastroesophageal reflux disease)   ? Hemorrhoids   ? Hypertension   ? Personal history of chemotherapy 2004  ? BREAST CA  ? Personal history of radiation therapy 2003  ? BREAST CA  ? Personal history of radiation therapy 2004  ? BREAST CA  ? Polyp of colon 2002  ? bleeding polyp of right ascending colon  ? PUD (peptic ulcer disease)   ? Seizures ([place])   ? epilepsy well controlled  ? ? ?OBJECTIVE ?General Patient is awake, alert, and oriented x 3 and in no acute distress. ?Derm Skin is dry and supple bilateral. Negative open lesions or macerations. Remaining integument unremarkable. Nails are tender, long, thickened and dystrophic with subungual debris, consistent with onychomycosis, 1-5 bilateral. No signs of infection noted. ?Vasc  DP and PT pedal pulses palpable bilaterally. Temperature gradient within normal limits.  ?Neuro Epicritic and protective threshold sensation grossly intact bilaterally.  ?Musculoskeletal Exam No symptomatic pedal deformities noted bilateral. Muscular strength within normal limits. ? ?ASSESSMENT ?1.  Pain due to onychomycosis of toenails both ? ?PLAN OF CARE ?1. Patient evaluated today.  ?2. Instructed to maintain good pedal hygiene and foot care.  ?3. Mechanical debridement of nails 1-5 bilaterally performed using a nail nipper. Filed with dremel without incident.  ?4. Return to clinic in 3 mos.  ? ? ?Edrick Kins, DPM ?[facility] ? ?Dr. Edrick Kins, DPM  ?  ?[address]                                     ?[place], [place] [address]                 ?Office [phone]  ?Fax [phone] ? ? ? ? ?

## 2021-11-05 ENCOUNTER — Inpatient Hospital Stay: Payer: Medicare Other

## 2021-11-06 ENCOUNTER — Telehealth: Payer: Self-pay | Admitting: Oncology

## 2021-11-06 NOTE — Telephone Encounter (Signed)
Pt daughter called in to r/s missed appointment  ? ?

## 2021-11-08 ENCOUNTER — Inpatient Hospital Stay: Payer: Medicare Other | Attending: Oncology

## 2021-11-08 DIAGNOSIS — E538 Deficiency of other specified B group vitamins: Secondary | ICD-10-CM | POA: Diagnosis present

## 2021-11-08 DIAGNOSIS — D509 Iron deficiency anemia, unspecified: Secondary | ICD-10-CM

## 2021-11-08 MED ORDER — CYANOCOBALAMIN 1000 MCG/ML IJ SOLN
1000.0000 ug | Freq: Once | INTRAMUSCULAR | Status: AC
  Administered 2021-11-08: 1000 ug via INTRAMUSCULAR
  Filled 2021-11-08: qty 1

## 2021-12-07 ENCOUNTER — Inpatient Hospital Stay: Payer: Medicare Other | Attending: Oncology

## 2021-12-14 ENCOUNTER — Inpatient Hospital Stay: Payer: Medicare Other

## 2021-12-21 ENCOUNTER — Inpatient Hospital Stay: Payer: Medicare Other

## 2022-01-07 ENCOUNTER — Inpatient Hospital Stay: Payer: Medicare Other

## 2022-01-07 ENCOUNTER — Inpatient Hospital Stay: Payer: Medicare Other | Attending: Oncology

## 2022-01-07 DIAGNOSIS — E538 Deficiency of other specified B group vitamins: Secondary | ICD-10-CM | POA: Diagnosis not present

## 2022-01-07 DIAGNOSIS — D509 Iron deficiency anemia, unspecified: Secondary | ICD-10-CM

## 2022-01-07 MED ORDER — CYANOCOBALAMIN 1000 MCG/ML IJ SOLN
1000.0000 ug | Freq: Once | INTRAMUSCULAR | Status: AC
  Administered 2022-01-07: 1000 ug via INTRAMUSCULAR
  Filled 2022-01-07: qty 1

## 2022-01-15 ENCOUNTER — Emergency Department: Payer: Medicare Other

## 2022-01-15 ENCOUNTER — Encounter: Payer: Self-pay | Admitting: Emergency Medicine

## 2022-01-15 ENCOUNTER — Encounter: Payer: Self-pay | Admitting: Certified Registered"

## 2022-01-15 ENCOUNTER — Encounter
Admission: EM | Disposition: A | Discharge status: Home Health | Payer: Self-pay | Source: Home / Self Care | Attending: Internal Medicine

## 2022-01-15 ENCOUNTER — Other Ambulatory Visit: Payer: Self-pay

## 2022-01-15 ENCOUNTER — Inpatient Hospital Stay
Admission: EM | Admit: 2022-01-15 | Discharge: 2022-01-20 | DRG: 357 | Disposition: A | Discharge status: Home Health | Payer: Medicare Other | Attending: Internal Medicine | Admitting: Internal Medicine

## 2022-01-15 DIAGNOSIS — E663 Overweight: Secondary | ICD-10-CM | POA: Diagnosis not present

## 2022-01-15 DIAGNOSIS — L8990 Pressure ulcer of unspecified site, unspecified stage: Secondary | ICD-10-CM | POA: Diagnosis present

## 2022-01-15 DIAGNOSIS — N179 Acute kidney failure, unspecified: Secondary | ICD-10-CM | POA: Diagnosis present

## 2022-01-15 DIAGNOSIS — Z9221 Personal history of antineoplastic chemotherapy: Secondary | ICD-10-CM

## 2022-01-15 DIAGNOSIS — Z90710 Acquired absence of both cervix and uterus: Secondary | ICD-10-CM | POA: Diagnosis not present

## 2022-01-15 DIAGNOSIS — K920 Hematemesis: Secondary | ICD-10-CM

## 2022-01-15 DIAGNOSIS — Z8711 Personal history of peptic ulcer disease: Secondary | ICD-10-CM

## 2022-01-15 DIAGNOSIS — Z7901 Long term (current) use of anticoagulants: Secondary | ICD-10-CM

## 2022-01-15 DIAGNOSIS — Z87891 Personal history of nicotine dependence: Secondary | ICD-10-CM | POA: Diagnosis not present

## 2022-01-15 DIAGNOSIS — E1122 Type 2 diabetes mellitus with diabetic chronic kidney disease: Secondary | ICD-10-CM | POA: Diagnosis present

## 2022-01-15 DIAGNOSIS — Z7952 Long term (current) use of systemic steroids: Secondary | ICD-10-CM

## 2022-01-15 DIAGNOSIS — G40909 Epilepsy, unspecified, not intractable, without status epilepticus: Secondary | ICD-10-CM

## 2022-01-15 DIAGNOSIS — I130 Hypertensive heart and chronic kidney disease with heart failure and stage 1 through stage 4 chronic kidney disease, or unspecified chronic kidney disease: Secondary | ICD-10-CM | POA: Diagnosis present

## 2022-01-15 DIAGNOSIS — Z6829 Body mass index (BMI) 29.0-29.9, adult: Secondary | ICD-10-CM

## 2022-01-15 DIAGNOSIS — R040 Epistaxis: Secondary | ICD-10-CM | POA: Diagnosis present

## 2022-01-15 DIAGNOSIS — Z923 Personal history of irradiation: Secondary | ICD-10-CM

## 2022-01-15 DIAGNOSIS — L89152 Pressure ulcer of sacral region, stage 2: Secondary | ICD-10-CM | POA: Diagnosis present

## 2022-01-15 DIAGNOSIS — Z833 Family history of diabetes mellitus: Secondary | ICD-10-CM | POA: Diagnosis not present

## 2022-01-15 DIAGNOSIS — D500 Iron deficiency anemia secondary to blood loss (chronic): Secondary | ICD-10-CM | POA: Diagnosis not present

## 2022-01-15 DIAGNOSIS — Z86711 Personal history of pulmonary embolism: Secondary | ICD-10-CM | POA: Diagnosis not present

## 2022-01-15 DIAGNOSIS — K254 Chronic or unspecified gastric ulcer with hemorrhage: Secondary | ICD-10-CM | POA: Diagnosis not present

## 2022-01-15 DIAGNOSIS — K219 Gastro-esophageal reflux disease without esophagitis: Secondary | ICD-10-CM | POA: Diagnosis present

## 2022-01-15 DIAGNOSIS — D72829 Elevated white blood cell count, unspecified: Secondary | ICD-10-CM | POA: Diagnosis present

## 2022-01-15 DIAGNOSIS — K922 Gastrointestinal hemorrhage, unspecified: Secondary | ICD-10-CM | POA: Diagnosis not present

## 2022-01-15 DIAGNOSIS — Z853 Personal history of malignant neoplasm of breast: Secondary | ICD-10-CM

## 2022-01-15 DIAGNOSIS — I10 Essential (primary) hypertension: Secondary | ICD-10-CM | POA: Diagnosis present

## 2022-01-15 DIAGNOSIS — Z811 Family history of alcohol abuse and dependence: Secondary | ICD-10-CM

## 2022-01-15 DIAGNOSIS — E1165 Type 2 diabetes mellitus with hyperglycemia: Secondary | ICD-10-CM | POA: Diagnosis present

## 2022-01-15 DIAGNOSIS — K921 Melena: Secondary | ICD-10-CM | POA: Diagnosis present

## 2022-01-15 DIAGNOSIS — I9589 Other hypotension: Secondary | ICD-10-CM | POA: Diagnosis present

## 2022-01-15 DIAGNOSIS — N182 Chronic kidney disease, stage 2 (mild): Secondary | ICD-10-CM | POA: Diagnosis present

## 2022-01-15 DIAGNOSIS — J45909 Unspecified asthma, uncomplicated: Secondary | ICD-10-CM | POA: Diagnosis present

## 2022-01-15 DIAGNOSIS — D62 Acute posthemorrhagic anemia: Secondary | ICD-10-CM | POA: Diagnosis present

## 2022-01-15 DIAGNOSIS — I5022 Chronic systolic (congestive) heart failure: Secondary | ICD-10-CM | POA: Diagnosis present

## 2022-01-15 DIAGNOSIS — Z79899 Other long term (current) drug therapy: Secondary | ICD-10-CM

## 2022-01-15 HISTORY — PX: EMBOLIZATION: CATH118239

## 2022-01-15 LAB — CBC WITH DIFFERENTIAL/PLATELET
Abs Immature Granulocytes: 0.13 10*3/uL — ABNORMAL HIGH (ref 0.00–0.07)
Basophils Absolute: 0.1 10*3/uL (ref 0.0–0.1)
Basophils Relative: 0 %
Eosinophils Absolute: 0.2 10*3/uL (ref 0.0–0.5)
Eosinophils Relative: 2 %
HCT: 30.7 % — ABNORMAL LOW (ref 36.0–46.0)
Hemoglobin: 9.2 g/dL — ABNORMAL LOW (ref 12.0–15.0)
Immature Granulocytes: 1 %
Lymphocytes Relative: 40 %
Lymphs Abs: 6.2 10*3/uL — ABNORMAL HIGH (ref 0.7–4.0)
MCH: 24.2 pg — ABNORMAL LOW (ref 26.0–34.0)
MCHC: 30 g/dL (ref 30.0–36.0)
MCV: 80.8 fL (ref 80.0–100.0)
Monocytes Absolute: 1.1 10*3/uL — ABNORMAL HIGH (ref 0.1–1.0)
Monocytes Relative: 7 %
Neutro Abs: 7.9 10*3/uL — ABNORMAL HIGH (ref 1.7–7.7)
Neutrophils Relative %: 50 %
Platelets: 247 10*3/uL (ref 150–400)
RBC: 3.8 MIL/uL — ABNORMAL LOW (ref 3.87–5.11)
RDW: 15.5 % (ref 11.5–15.5)
Smear Review: NORMAL
WBC: 15.4 10*3/uL — ABNORMAL HIGH (ref 4.0–10.5)
nRBC: 0 % (ref 0.0–0.2)

## 2022-01-15 LAB — PROTIME-INR
INR: 1.4 — ABNORMAL HIGH (ref 0.8–1.2)
Prothrombin Time: 17.2 seconds — ABNORMAL HIGH (ref 11.4–15.2)

## 2022-01-15 LAB — COMPREHENSIVE METABOLIC PANEL
ALT: 11 U/L (ref 0–44)
AST: 19 U/L (ref 15–41)
Albumin: 2.8 g/dL — ABNORMAL LOW (ref 3.5–5.0)
Alkaline Phosphatase: 83 U/L (ref 38–126)
Anion gap: 8 (ref 5–15)
BUN: 32 mg/dL — ABNORMAL HIGH (ref 8–23)
CO2: 23 mmol/L (ref 22–32)
Calcium: 8.6 mg/dL — ABNORMAL LOW (ref 8.9–10.3)
Chloride: 110 mmol/L (ref 98–111)
Creatinine, Ser: 1.13 mg/dL — ABNORMAL HIGH (ref 0.44–1.00)
GFR, Estimated: 45 mL/min — ABNORMAL LOW (ref >60)
Glucose, Bld: 241 mg/dL — ABNORMAL HIGH (ref 70–99)
Potassium: 4 mmol/L (ref 3.5–5.1)
Sodium: 141 mmol/L (ref 135–145)
Total Bilirubin: <0.1 mg/dL — ABNORMAL LOW (ref 0.3–1.2)
Total Protein: 5.7 g/dL — ABNORMAL LOW (ref 6.5–8.1)

## 2022-01-15 LAB — ABO/RH: ABO/RH(D): A POS

## 2022-01-15 LAB — TROPONIN I (HIGH SENSITIVITY)
Troponin I (High Sensitivity): 15 ng/L (ref <18)
Troponin I (High Sensitivity): 17 ng/L (ref <18)

## 2022-01-15 LAB — HEMOGLOBIN AND HEMATOCRIT, BLOOD
HCT: 37.4 % (ref 36.0–46.0)
HCT: 34.5 % — ABNORMAL LOW (ref 36.0–46.0)
Hemoglobin: 12 g/dL (ref 12.0–15.0)
Hemoglobin: 11.5 g/dL — ABNORMAL LOW (ref 12.0–15.0)

## 2022-01-15 LAB — MRSA NEXT GEN BY PCR, NASAL: MRSA by PCR Next Gen: NOT DETECTED

## 2022-01-15 LAB — PREPARE RBC (CROSSMATCH)

## 2022-01-15 LAB — GLUCOSE, CAPILLARY
Glucose-Capillary: 170 mg/dL — ABNORMAL HIGH (ref 70–99)
Glucose-Capillary: 180 mg/dL — ABNORMAL HIGH (ref 70–99)

## 2022-01-15 SURGERY — EMBOLIZATION
Anesthesia: Moderate Sedation

## 2022-01-15 SURGERY — ESOPHAGOGASTRODUODENOSCOPY (EGD) WITH PROPOFOL
Anesthesia: General

## 2022-01-15 MED ORDER — ONDANSETRON HCL 4 MG/2ML IJ SOLN
4.0000 mg | Freq: Four times a day (QID) | INTRAMUSCULAR | Status: DC | PRN
  Filled 2022-01-15: qty 2

## 2022-01-15 MED ORDER — SODIUM CHLORIDE 0.9% FLUSH: 3.0000 mL | INTRAVENOUS | Status: DC | PRN

## 2022-01-15 MED ORDER — SODIUM CHLORIDE 0.9 % IV SOLN: 250.0000 mL | INTRAVENOUS | Status: DC | PRN

## 2022-01-15 MED ORDER — IODIXANOL 320 MG/ML IV SOLN
INTRAVENOUS | Status: DC | PRN
  Administered 2022-01-15: 50 mL

## 2022-01-15 MED ORDER — IOHEXOL 350 MG/ML SOLN
100.0000 mL | Freq: Once | INTRAVENOUS | Status: AC | PRN
  Administered 2022-01-15: 100 mL via INTRAVENOUS

## 2022-01-15 MED ORDER — LACTATED RINGERS IV BOLUS
1000.0000 mL | Freq: Once | INTRAVENOUS | Status: AC
  Administered 2022-01-15: 1000 mL via INTRAVENOUS

## 2022-01-15 MED ORDER — POLYETHYLENE GLYCOL 3350 17 G PO PACK: 17.0000 g | PACK | Freq: Every day | ORAL | Status: DC | PRN

## 2022-01-15 MED ORDER — ACETAMINOPHEN 325 MG PO TABS: 650.0000 mg | ORAL_TABLET | ORAL | Status: DC | PRN

## 2022-01-15 MED ORDER — PANTOPRAZOLE SODIUM 40 MG IV SOLR: 40.0000 mg | Freq: Once | INTRAVENOUS | Status: DC

## 2022-01-15 MED ORDER — CEFAZOLIN SODIUM-DEXTROSE 2-4 GM/100ML-% IV SOLN
INTRAVENOUS | Status: AC
  Administered 2022-01-15: 2 g via INTRAVENOUS
  Filled 2022-01-15: qty 100

## 2022-01-15 MED ORDER — DOCUSATE SODIUM 100 MG PO CAPS: 100.0000 mg | ORAL_CAPSULE | Freq: Two times a day (BID) | ORAL | Status: DC | PRN

## 2022-01-15 MED ORDER — BUDESONIDE 0.5 MG/2ML IN SUSP
0.5000 mg | Freq: Two times a day (BID) | RESPIRATORY_TRACT | Status: DC
Start: 2022-01-15 — End: 2022-01-20
  Administered 2022-01-16 – 2022-01-20 (×9): 0.5 mg via RESPIRATORY_TRACT
  Filled 2022-01-15 (×9): qty 2

## 2022-01-15 MED ORDER — SODIUM CHLORIDE 0.9% IV SOLUTION: Freq: Once | INTRAVENOUS | Status: DC

## 2022-01-15 MED ORDER — MONTELUKAST SODIUM 10 MG PO TABS
10.0000 mg | ORAL_TABLET | Freq: Every day | ORAL | Status: DC
  Administered 2022-01-17 – 2022-01-20 (×4): 10 mg via ORAL
  Filled 2022-01-15 (×4): qty 1

## 2022-01-15 MED ORDER — LEVETIRACETAM IN NACL 500 MG/100ML IV SOLN
500.0000 mg | Freq: Two times a day (BID) | INTRAVENOUS | Status: DC
  Administered 2022-01-15 – 2022-01-20 (×10): 500 mg via INTRAVENOUS
  Filled 2022-01-15 (×10): qty 100

## 2022-01-15 MED ORDER — ACETAMINOPHEN 325 MG PO TABS
650.0000 mg | ORAL_TABLET | ORAL | Status: DC | PRN
  Administered 2022-01-19: 650 mg via ORAL
  Filled 2022-01-15: qty 2

## 2022-01-15 MED ORDER — PANTOPRAZOLE SODIUM 40 MG IV SOLR: 40.0000 mg | Freq: Two times a day (BID) | INTRAVENOUS | Status: DC

## 2022-01-15 MED ORDER — PANTOPRAZOLE SODIUM 40 MG IV SOLR
40.0000 mg | Freq: Once | INTRAVENOUS | Status: AC
Start: 2022-01-15 — End: 2022-01-15
  Administered 2022-01-15: 40 mg via INTRAVENOUS
  Filled 2022-01-15: qty 10

## 2022-01-15 MED ORDER — SODIUM CHLORIDE 0.9% FLUSH
3.0000 mL | Freq: Two times a day (BID) | INTRAVENOUS | Status: DC
  Administered 2022-01-16 – 2022-01-20 (×7): 3 mL via INTRAVENOUS

## 2022-01-15 MED ORDER — SODIUM CHLORIDE 0.9% IV SOLUTION
Freq: Once | INTRAVENOUS | Status: DC
  Filled 2022-01-15: qty 250

## 2022-01-15 MED ORDER — ONDANSETRON HCL 4 MG/2ML IJ SOLN
4.0000 mg | Freq: Four times a day (QID) | INTRAMUSCULAR | Status: DC | PRN
  Administered 2022-01-15: 4 mg via INTRAVENOUS

## 2022-01-15 MED ORDER — CHLORHEXIDINE GLUCONATE CLOTH 2 % EX PADS
6.0000 | MEDICATED_PAD | Freq: Every day | CUTANEOUS | Status: DC
  Administered 2022-01-15 – 2022-01-16 (×2): 6 via TOPICAL

## 2022-01-15 MED ORDER — SODIUM CHLORIDE 0.9 % IV SOLN: INTRAVENOUS | Status: DC

## 2022-01-15 MED ORDER — CEFAZOLIN SODIUM-DEXTROSE 2-4 GM/100ML-% IV SOLN: 2.0000 g | INTRAVENOUS | Status: AC

## 2022-01-15 MED ORDER — OXYCODONE HCL 5 MG PO TABS: 5.0000 mg | ORAL_TABLET | ORAL | Status: DC | PRN

## 2022-01-15 MED ORDER — PROTHROMBIN COMPLEX CONC HUMAN 500 UNITS IV KIT
4206.0000 [IU] | PACK | Status: AC
  Administered 2022-01-15: 4206 [IU] via INTRAVENOUS
  Filled 2022-01-15: qty 3206

## 2022-01-15 MED ORDER — SODIUM CHLORIDE 0.9% FLUSH
3.0000 mL | Freq: Two times a day (BID) | INTRAVENOUS | Status: DC
  Administered 2022-01-16: 3 mL via INTRAVENOUS

## 2022-01-15 MED ORDER — MORPHINE SULFATE (PF) 4 MG/ML IV SOLN
2.0000 mg | INTRAVENOUS | Status: DC | PRN
  Administered 2022-01-15: 2 mg via INTRAVENOUS
  Filled 2022-01-15 (×2): qty 1

## 2022-01-15 MED ORDER — LEVETIRACETAM 500 MG PO TABS
500.0000 mg | ORAL_TABLET | Freq: Two times a day (BID) | ORAL | Status: DC
  Filled 2022-01-15 (×2): qty 1

## 2022-01-15 MED ORDER — PANTOPRAZOLE INFUSION (NEW) - SIMPLE MED
8.0000 mg/h | INTRAVENOUS | Status: AC
  Administered 2022-01-15 – 2022-01-17 (×6): 8 mg/h via INTRAVENOUS
  Filled 2022-01-15 (×8): qty 100

## 2022-01-15 MED ORDER — SODIUM CHLORIDE 0.9 % IV SOLN: INTRAVENOUS | Status: AC

## 2022-01-15 SURGICAL SUPPLY — 27 items
CATH ANGIO 5F PIGTAIL 65CM (CATHETERS) ×1 IMPLANT
CATH MICROCATH PRGRT 2.8F 110 (CATHETERS) IMPLANT
CATH VS15FR (CATHETERS) ×1 IMPLANT
COIL 400 COMPLEX SOFT 3X15CM (Vascular Products) ×2 IMPLANT
COIL 400 COMPLEX SOFT 3X5CM (Vascular Products) ×1 IMPLANT
COVER PROBE U/S 5X48 (MISCELLANEOUS) ×1 IMPLANT
DEVICE STARCLOSE SE CLOSURE (Vascular Products) ×1 IMPLANT
DEVICE TORQUE .025-.038 (MISCELLANEOUS) ×1 IMPLANT
DRAPE BRACHIAL (DRAPES) ×1 IMPLANT
DRAPE INCISE IOBAN 66X45 STRL (DRAPES) ×1 IMPLANT
GLIDEWIRE STIFF .35X180X3 HYDR (WIRE) ×1 IMPLANT
GUIDEWIRE GT SS DBLE ANG 0.016 (WIRE) ×1 IMPLANT
HANDLE DETACHMENT COIL (MISCELLANEOUS) ×1 IMPLANT
MICROCATH PROGREAT 2.8F 110 CM (CATHETERS) ×2
NDL ENTRY 21GA 7CM ECHOTIP (NEEDLE) IMPLANT
NEEDLE ENTRY 21GA 7CM ECHOTIP (NEEDLE) ×2 IMPLANT
PACK ANGIOGRAPHY (CUSTOM PROCEDURE TRAY) ×2 IMPLANT
SET INTRO CAPELLA COAXIAL (SET/KITS/TRAYS/PACK) ×1 IMPLANT
SHEATH BRITE TIP 5FRX11 (SHEATH) ×1 IMPLANT
SHEATH BRITE TIP 6FRX5.5 (SHEATH) ×1 IMPLANT
SUT MNCRL AB 4-0 PS2 18 (SUTURE) ×2 IMPLANT
SUT SILK 0 FSL (SUTURE) ×1 IMPLANT
SYR EMBOSPHERE 500-700 2ML (Embolic) ×2 IMPLANT
SYR MEDRAD MARK 7 150ML (SYRINGE) ×1 IMPLANT
SYRINGE EMBOSPHERE 500-700 2ML (Embolic) IMPLANT
TUBING CONTRAST HIGH PRESS 72 (TUBING) ×1 IMPLANT
WIRE GUIDERIGHT .035X150 (WIRE) ×1 IMPLANT

## 2022-01-15 NOTE — Consult Note (Signed)
[facility] Vascular Consult Note  MRN : [id number]  Rebecca Wolf is a 88 y.o. ([birth year]) female who presents with chief complaint of  Chief Complaint  Patient presents with   GI Bleeding  .   Consulting Physician:Kurian Kasa, MD Reason for consult: Gastrointestinal bleed History of Present Illness: Rebecca Wolf is a 88 year old female who has a previous medical history of peptic ulcer disease, chronic kidney disease, seizures, diabetes, hypertension, and asthma that presented to [facility] due to a nosebleed.  She was also noted to have dark tarry stools.  During her time in the emergency room the patient began to vomit bright red blood.  The patient also has a history of a bleeding ulcer years ago.  The patient remains on Eliquis 2.5 mg twice daily as maintenance following her pulmonary embolism years ago.  The patient was given Kcentra to reverse Eliquis today at 9 AM.  Initially there was plan to have the patient undergo endoscopy however she began having large volumes of hematemesis which would likely make endoscopy visualization difficult.  Based on this decision was made for the patient undergo embolization.  Current Facility-Administered Medications  Medication Dose Route Frequency Provider Last Rate Last Admin   [MAR Hold] 0.9 %  sodium chloride infusion (Manually program via Guardrails IV Fluids)   Intravenous Once Blake Divine, MD       [facility] Hold] 0.9 %  sodium chloride infusion (Manually program via Guardrails IV Fluids)   Intravenous Once Blake Divine, MD       [facility] Hold] 0.9 %  sodium chloride infusion  250 mL Intravenous PRN Flora Lipps, MD       [facility] Hold] acetaminophen (TYLENOL) tablet 650 mg  650 mg Oral Q4H PRN Flora Lipps, MD       [facility] Hold] ceFAZolin (ANCEF) IVPB 2g/100 mL premix  2 g Intravenous On Call to OR Schnier, Dolores Lory, MD       [MAR Hold] docusate sodium (COLACE) capsule 100 mg  100 mg Oral BID PRN Flora Lipps, MD       [facility] Hold] ondansetron (ZOFRAN) injection 4 mg  4 mg Intravenous Q6H PRN Flora Lipps, MD       [MAR Hold] pantoprazole (PROTONIX) injection 40 mg  40 mg Intravenous Q12H Bradly Bienenstock, NP       [MAR Hold] pantoprazole (PROTONIX) injection 40 mg  40 mg Intravenous Once Darel Hong D, NP       pantoprozole (PROTONIX) 80 mg /NS 100 mL infusion  8 mg/hr Intravenous Continuous Bradly Bienenstock, NP       [MAR Hold] polyethylene glycol (MIRALAX / GLYCOLAX) packet 17 g  17 g Oral Daily PRN Flora Lipps, MD       [facility] Hold] sodium chloride flush (NS) 0.9 % injection 3 mL  3 mL Intravenous Q12H Flora Lipps, MD       [facility] Hold] sodium chloride flush (NS) 0.9 % injection 3 mL  3 mL Intravenous PRN Flora Lipps, MD        Past Medical History:  Diagnosis Date   Asthma    Breast cancer ([place]) 2003   LT LUMPECTOMY   Breast cancer ([place]) 2004   LT LUMPECTOMY   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   GERD (gastroesophageal reflux disease)    Hemorrhoids    Hypertension    Personal history of chemotherapy 2004   BREAST CA   Personal history of radiation  therapy 2003   BREAST CA   Personal history of radiation therapy 2004   BREAST CA   Polyp of colon 2002   bleeding polyp of right ascending colon   PUD (peptic ulcer disease)    Seizures ([place])    epilepsy well controlled    Past Surgical History:  Procedure Laterality Date   BREAST EXCISIONAL BIOPSY Left 2003   positive   BREAST EXCISIONAL BIOPSY Left 2004   positive   BREAST LUMPECTOMY Left 2003   BREAST CA   BREAST LUMPECTOMY Left 2004   BREAST CA   HEMICOLECTOMY Right    bowel obstruction   HEMORRHOID SURGERY     THORACOTOMY/LOBECTOMY  1999   carcinoid tumor   TOTAL VAGINAL HYSTERECTOMY     VARICOSE VEIN SURGERY      Social History Social History   Tobacco Use   Smoking status: Former   Smokeless tobacco: Never  Scientific laboratory technician Use: Never used  Substance Use Topics   Alcohol use:  No    Alcohol/week: 0.0 standard drinks of alcohol   Drug use: No    Family History Family History  Problem Relation Age of Onset   Alcoholism Father    Diabetes Sister    Breast cancer Neg Hx     Allergies  Allergen Reactions   Levofloxacin Shortness Of Breath   Sulfa Antibiotics Rash     REVIEW OF SYSTEMS (Negative unless checked)  Constitutional: '[]'$ Weight loss  '[]'$ Fever  '[]'$ Chills Cardiac: '[]'$ Chest pain   '[]'$ Chest pressure   '[]'$ Palpitations   '[]'$ Shortness of breath when laying flat   '[]'$ Shortness of breath at rest   '[]'$ Shortness of breath with exertion. Vascular:  '[]'$ Pain in legs with walking   '[]'$ Pain in legs at rest   '[]'$ Pain in legs when laying flat   '[]'$ Claudication   '[]'$ Pain in feet when walking  '[]'$ Pain in feet at rest  '[]'$ Pain in feet when laying flat   '[]'$ History of DVT   '[]'$ Phlebitis   '[]'$ Swelling in legs   '[]'$ Varicose veins   '[]'$ Non-healing ulcers Pulmonary:   '[]'$ Uses home oxygen   '[]'$ Productive cough   '[]'$ Hemoptysis   '[]'$ Wheeze  '[]'$ COPD   '[]'$ Asthma Neurologic:  '[]'$ Dizziness  '[]'$ Blackouts   '[]'$ Seizures   '[]'$ History of stroke   '[]'$ History of TIA  '[]'$ Aphasia   '[]'$ Temporary blindness   '[]'$ Dysphagia   '[]'$ Weakness or numbness in arms   '[]'$ Weakness or numbness in legs Musculoskeletal:  '[]'$ Arthritis   '[]'$ Joint swelling   '[]'$ Joint pain   '[]'$ Low back pain Hematologic:  '[]'$ Easy bruising  '[]'$ Easy bleeding   '[]'$ Hypercoagulable state   '[]'$ Anemic  '[]'$ Hepatitis Gastrointestinal:  '[x]'$ Blood in stool   '[x]'$ Vomiting blood  '[]'$ Gastroesophageal reflux/heartburn   '[]'$ Difficulty swallowing. Genitourinary:  '[]'$ Chronic kidney disease   '[]'$ Difficult urination  '[]'$ Frequent urination  '[]'$ Burning with urination   '[]'$ Blood in urine Skin:  '[]'$ Rashes   '[]'$ Ulcers   '[]'$ Wounds Psychological:  '[]'$ History of anxiety   '[]'$  History of major depression.  Physical Examination  Vitals:   01/15/22 1059 01/15/22 1116 01/15/22 1258 01/15/22 1300  BP:  117/80 128/79   Pulse:  (!) 116 (!) 110   Resp:  (!) 22 (!) 27   Temp: 98.1 F (36.7 C)   (!) 97.5 F (36.4 C)   TempSrc: Oral   Oral  SpO2:  99% 99%   Weight:   76.2 kg   Height:   '5\' 4"'$  (1.626 m)    Body mass index is 28.84 kg/m. Gen:  WD/WN, NAD Head: [place]/AT,  No temporalis wasting. Prominent temp pulse not noted. Ear/Nose/Throat: Hearing grossly intact, nares w/o erythema or drainage, oropharynx w/o Erythema/Exudate Eyes: Sclera non-icteric, conjunctiva clear Neck: Trachea midline.  No JVD.  Pulmonary:  Good air movement, respirations not labored, equal bilaterally.  Cardiac: RRR, normal S1, S2. Vascular: 2+ edema bilaterally  Gastrointestinal: soft, non-tender/non-distended. No guarding/reflex.  Musculoskeletal: M/S 5/5 throughout.  Extremities without ischemic changes.  No deformity or atrophy. No edema. Neurologic: Sensation grossly intact in extremities.  Symmetrical.  Speech is fluent. Motor exam as listed above. Psychiatric: Judgment intact, Mood & affect appropriate for pt's clinical situation. Dermatologic: No rashes or ulcers noted.  No cellulitis or open wounds. Lymph : No Cervical, Axillary, or Inguinal lymphadenopathy.    CBC Lab Results  Component Value Date   WBC 15.4 (H) 01/15/2022   HGB 12.0 01/15/2022   HCT 37.4 01/15/2022   MCV 80.8 01/15/2022   PLT 247 01/15/2022    BMET    Component Value Date/Time   NA 141 01/15/2022 0755   NA 138 10/11/2014 0920   K 4.0 01/15/2022 0755   K 4.3 10/11/2014 0920   CL 110 01/15/2022 0755   CL 104 10/11/2014 0920   CO2 23 01/15/2022 0755   CO2 24 10/11/2014 0920   GLUCOSE 241 (H) 01/15/2022 0755   GLUCOSE 145 (H) 10/11/2014 0920   BUN 32 (H) 01/15/2022 0755   BUN 26 (H) 10/11/2014 0920   CREATININE 1.13 (H) 01/15/2022 0755   CREATININE 1.13 (H) 10/11/2014 0920   CALCIUM 8.6 (L) 01/15/2022 0755   CALCIUM 9.5 10/11/2014 0920   GFRNONAA 45 (L) 01/15/2022 0755   GFRNONAA 43 (L) 10/11/2014 0920   GFRAA 50 (L) 08/25/2019 1114   GFRAA 50 (L) 10/11/2014 0920   Estimated Creatinine Clearance: 29.8 mL/min (A) (by C-G formula  based on SCr of 1.13 mg/dL (H)).  COAG Lab Results  Component Value Date   INR 1.4 (H) 01/15/2022    Radiology CT Angio Abd/Pel W and/or Wo Contrast  Result Date: 01/15/2022 CLINICAL DATA:  GI bleeding. EXAM: CTA ABDOMEN AND PELVIS WITHOUT AND WITH CONTRAST TECHNIQUE: Multidetector CT imaging of the abdomen and pelvis was performed using the standard protocol during bolus administration of intravenous contrast. Multiplanar reconstructed images and MIPs were obtained and reviewed to evaluate the vascular anatomy. RADIATION DOSE REDUCTION: This exam was performed according to the departmental dose-optimization program which includes automated exposure control, adjustment of the mA and/or kV according to patient size and/or use of iterative reconstruction technique. CONTRAST:  159m OMNIPAQUE IOHEXOL 350 MG/ML SOLN COMPARISON:  Chest CT-06/29/2017; 01/23/2017 FINDINGS: VASCULAR Aorta: There is a moderate amount of mixed calcified and noncalcified atherosclerotic plaque within a markedly tortuous but normal caliber abdominal aorta, not resulting in a hemodynamically significant stenosis. No evidence of abdominal aortic dissection or perivascular stranding. Celiac: There is a minimal amount of mixed calcified and noncalcified atherosclerotic plaque involving the origin of the celiac artery, not resulting in a hemodynamically significant stenosis. The left gastric artery has a conventional takeoff from the celiac trunk. SMA: Minimal amount of noncalcified atherosclerotic plaque involves the origin of the SMA as well as the main trunk of the SMA, not resulting in hemodynamically significant stenosis. The major branch vessels of the SMA appear widely patent. Conventional branching pattern. No discrete lumen filling defects to suggest distal embolism. Renals: Solitary bilaterally noncalcified atherosclerotic plaque involves the origin of the right renal artery approaching 50% luminal narrowing (coronal image 57,  series 12). There is a  minimal amount of mixed calcified and noncalcified atherosclerotic plaque involving the origin of the left renal artery, not resulting in hemodynamically significant stenosis. No vessel irregularity to suggest FMD. IMA: Diseased at its origin though remains patent. Inflow: There is a minimal amount of eccentric calcified atherosclerotic plaque involving the bilateral common iliac arteries, not resulting in hemodynamically significant stenosis. The bilateral internal iliac arteries are disease though patent and of normal caliber. The bilateral external iliac arteries are of normal caliber and widely patent without hemodynamically significant narrowing. Proximal Outflow: There is a minimal amount of calcified atherosclerotic plaque involving the bilateral common femoral arteries, not resulting in hemodynamically significant stenosis. The imaged portions of the bilateral superficial and deep femoral arteries are of normal caliber and widely patent throughout their imaged courses. Veins: The IVC and pelvic venous systems appear widely patent. Review of the MIP images confirms the above findings. _________________________________________________________ NON-VASCULAR Lower chest: Limited visualization of the lower thorax demonstrates minimal bibasilar dependent subsegmental atelectasis. No focal airspace opacities. No pleural effusion. Note is made of a punctate (approximately 3 mm) right lower lobe pulmonary nodule (image 4, series 6) which is unchanged compared to remote chest CT performed 03/2016 and thus of benign etiology. Hepatobiliary: Normal hepatic contour. No discrete hepatic lesions. Normal appearance of the gallbladder given degree distention. No radiopaque gallstones. No intra or extrahepatic biliary ductal dilatation. No ascites. Pancreas: Normal appearance of the pancreas. Spleen: Normal appearance of the spleen. Adrenals/Urinary Tract: There is symmetric enhancement and excretion of  the bilateral kidneys. Note is made of an approximately 1.6 cm hypoattenuating right-sided renal lesion which is incompletely characterized on the present examination and while potentially representative of a minimally complex cyst appears to demonstrate minimal enhancement (axial image 66, series 16; image 61, series 9 and image 27, series 4). There is a approximally 2.0 cm hypoattenuating partially exophytic nonenhancing cyst arising from the inferior pole of the right kidney (image 84, series 16). No discrete left-sided renal lesions. Calcifications about the bilateral renal hila may be vascular in etiology. No urinary obstruction or perinephric stranding. Normal appearance the bilateral adrenal glands. Normal appearance of the urinary bladder given degree of distention. Stomach/Bowel: There is an ill-defined area of intraluminal active extravasation about the lesser curvature of the stomach (axial image 20, series 9; coronal image 57, series 6), with pooling seen on the delayed phase imaging (representative axial image 22, series 16). There is a large amount of high density debris within the gastric lumen compatible with blood products. Large liquid stool burden within the colon without evidence of enteric obstruction. Postoperative change of the cecum. Normal appearance of the terminal ileum. No pneumoperitoneum, pneumatosis or portal venous gas. Lymphatic: No bulky retroperitoneal, mesenteric, pelvic or inguinal lymphadenopathy. Reproductive: Post hysterectomy. No discrete adnexal lesions. No free fluid the pelvic cul-de-sac. Other: Tiny mesenteric fat containing periumbilical hernia. Note is also made of a smaller midline ventral wall presumably incisional hernia. Subcutaneous edema about the midline of the low back. Musculoskeletal: No acute or aggressive osseous abnormalities. Moderate degenerative change the bilateral hips with joint space loss, subchondral sclerosis and osteophytosis. Moderate rotatory  scoliotic curvature of the lumbar spine with associated multilevel moderate to severe DDD, worse about the central concavity of the scoliotic curvature. IMPRESSION: VASCULAR 1. The examination is positive for acute intraluminal gastric bleeding centered at the lesser curvature of the stomach with large amount of blood products within the stomach. The etiology of the bleeding is not depicted on this examination and thus may  be secondary to a ulcer. Specifically, there is no stigmata of portal venous hypertension or definitive gastric or esophageal varices. Further evaluation and potential management with endoscopy is advised. 2. Moderate amount of calcified atherosclerotic plaque within a tortuous but normal caliber abdominal aorta, not resulting in hemodynamically significant stenosis. Aortic Atherosclerosis (ICD10-I70.0). 3. Approximately 50% luminal narrowing involving the origin of the right renal artery without associated asymmetric renal atrophy or delayed renal enhancement. NON-VASCULAR 1. Indeterminate approximately 1.6 cm hypoattenuating right-sided renal lesion, potentially representative of a minimally complex renal cyst though incompletely evaluated on the present examination. Comparison with prior outside examinations (if available), is advised. Otherwise, further evaluation with nonemergent abdominal MRI could performed as indicated. Critical Value/emergent results were called by telephone at the time of interpretation on 01/15/2022 at 10:22 am to provider [facility] , who verbally acknowledged these results. Electronically Signed   By: Sandi Mariscal M.D.   On: 01/15/2022 10:32   DG Chest Portable 1 View  Result Date: 01/15/2022 CLINICAL DATA:  Shortness of breath.  Nose bleed. EXAM: PORTABLE CHEST 1 VIEW COMPARISON:  06/29/2017 FINDINGS: Heart size is normal. Aorta is tortuous. Lungs are free of focal consolidations and pleural effusions. Mild spondylosis associated with scoliosis of the  thoracolumbar spine. Degenerative changes in both shoulders. IMPRESSION: No evidence for acute cardiopulmonary abnormality. Electronically Signed   By: Nolon Nations M.D.   On: 01/15/2022 08:10      Assessment/Plan 1.  Gastrointestinal bleed  CT scan shows evidence of gastric bleed.  Discussed with patient and family that we will attempt to perform coil embolization to possibly stop and/or at least decrease the amount of bleeding.  By doing this and may allow for EGD in the case that there may be an ulceration partially causing this issue.  If we are ultimately unsuccessful the patient would require evaluation by general surgery.  This was discussed with the patient and daughter.  We discussed risk, benefits and alternatives and they are agreeable to proceed.  2.  History of pulmonary embolism Patient was previously placed on Eliquis due to a previous history of pulmonary embolism.  In this instance because this pulmonary embolism was several years ago, there is no immediate risk to the patient at this time.  We do not believe IVC filter is warranted at this time.    Family Communication:  Thank you for allowing [place] to participate in the care of this patient.   Kris Hartmann, NP [facility] [phone] (Office Phone) [phone] (Office Fax) [phone] (Pager)  01/15/2022 1:03 PM  Staff may message me via secure chat in [place]  but this may not receive immediate response,  please page for urgent matters!  Dictation software was used to generate the above note. Typos may occur and escape review, as with typed/written notes. Any error is purely unintentional.  Please contact me directly for clarity if needed.

## 2022-01-15 NOTE — Interval H&P Note (Signed)
History and Physical Interval Note:  01/15/2022 2:01 PM  Rebecca Wolf  has presented today for surgery, with the diagnosis of Massive GI bleed.  The various methods of treatment have been discussed with the patient and family. After consideration of risks, benefits and other options for treatment, the patient has consented to  Procedure(s): EMBOLIZATION (N/A) as a surgical intervention.  The patient's history has been reviewed, patient examined, no change in status, stable for surgery.  I have reviewed the patient's chart and labs.  Questions were answered to the patient's satisfaction.     [name]

## 2022-01-15 NOTE — Anesthesia Preprocedure Evaluation (Signed)
Anesthesia Evaluation  Patient identified by MRN, date of birth, ID band Patient awake    Reviewed: Allergy & Precautions, NPO status , Patient's Chart, lab work & pertinent test results  Airway Mallampati: III  TM Distance: >3 FB Neck ROM: full    Dental  (+) Chipped   Pulmonary asthma , former smoker,    Pulmonary exam normal        Cardiovascular hypertension, Normal cardiovascular exam     Neuro/Psych Seizures -,  negative psych ROS   GI/Hepatic negative GI ROS, Neg liver ROS,   Endo/Other  negative endocrine ROS  Renal/GU      Musculoskeletal   Abdominal   Peds  Hematology negative hematology ROS (+)   Anesthesia Other Findings gi bleed  Past Medical History: No date: Asthma 2003: Breast cancer ([place])     Comment:  LT LUMPECTOMY 2004: Breast cancer ([place])     Comment:  LT LUMPECTOMY 2001: Carcinoid tumor determined by biopsy of lung No date: GERD (gastroesophageal reflux disease) No date: Hemorrhoids No date: Hypertension 2004: Personal history of chemotherapy     Comment:  BREAST CA 2003: Personal history of radiation therapy     Comment:  BREAST CA 2004: Personal history of radiation therapy     Comment:  BREAST CA 2002: Polyp of colon     Comment:  bleeding polyp of right ascending colon No date: PUD (peptic ulcer disease) No date: Seizures ([place])     Comment:  epilepsy well controlled  Past Surgical History: 2003: BREAST EXCISIONAL BIOPSY; Left     Comment:  positive 2004: BREAST EXCISIONAL BIOPSY; Left     Comment:  positive 2003: BREAST LUMPECTOMY; Left     Comment:  BREAST CA 2004: BREAST LUMPECTOMY; Left     Comment:  BREAST CA No date: HEMICOLECTOMY; Right     Comment:  bowel obstruction No date: HEMORRHOID SURGERY 1999: THORACOTOMY/LOBECTOMY     Comment:  carcinoid tumor No date: TOTAL VAGINAL HYSTERECTOMY No date: VARICOSE VEIN SURGERY  BMI    Body Mass Index: 29.18 kg/m       Reproductive/Obstetrics negative OB ROS                             Anesthesia Physical Anesthesia Plan  ASA: 4 and emergent  Anesthesia Plan: General ETT   Post-op Pain Management:    Induction: Rapid sequence and Intravenous  PONV Risk Score and Plan: Ondansetron, Dexamethasone, Midazolam and Treatment may vary due to age or medical condition  Airway Management Planned:   Additional Equipment:   Intra-op Plan:   Post-operative Plan:   Informed Consent:     Dental Advisory Given  Plan Discussed with: Anesthesiologist, CRNA and Surgeon  Anesthesia Plan Comments:        Anesthesia Quick Evaluation

## 2022-01-15 NOTE — ED Notes (Signed)
Pt had approx 525m of bloody vomitus. MD [place] aware.

## 2022-01-15 NOTE — ED Triage Notes (Signed)
Pt from home for unexpected bleeding. Pt had nosebleed. EMS state pt had BM Before leaving and it was dark but not tarry. Dtr reports pt is at baseline. Hx seizures but has not had one for 20 years. Takes Keppra  but has not had it this morning. Pt on eliquis. Epitaxis is well controlled at this time.

## 2022-01-15 NOTE — H&P (Signed)
Jonathon Bellows , MD [address], [place], [place], [place], [id number], [place], [place], [place], [address] Phone: [phone]  Fax: [phone]  Consultation  Referring Provider:   ER Primary Care Physician:  Erline Levine, MD Primary Gastroenterologist:  [facility]          Reason for Consultation:    Melena   Date of Admission:  01/15/2022 Date of Consultation:  01/15/2022         HPI:   Rebecca Wolf is a 88 y.o. female  who has a history of PE on eloquis presented to the ER with Epistaxis along with a dark bowel movement , in the ER vomited bright red blood . Last dose of Eloquis is 6 pm last night .  I went into the ER to see her she appeared to be comfortable sitting in her bed and she had had multiple black bowel movements since she present to the ER earlier today.  Denied any abdominal pain denied any NSAID use.  She had previously thrown up but was not throwing up actively.  I discussed with her that she would need an endoscopy I went up to the endoscopy unit and we plan to call her up within the next 20 minutes when I heard that she was actively throwing up blood and having black tarry stools at the same time and discussed with the ER attending that she would likely need to be intubated stabilized before any endoscopic evaluation can be considered  Past Medical History:  Diagnosis Date   Asthma    Breast cancer ([place]) 2003   LT LUMPECTOMY   Breast cancer ([place]) 2004   LT LUMPECTOMY   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   GERD (gastroesophageal reflux disease)    Hemorrhoids    Hypertension    Personal history of chemotherapy 2004   BREAST CA   Personal history of radiation therapy 2003   BREAST CA   Personal history of radiation therapy 2004   BREAST CA   Polyp of colon 2002   bleeding polyp of right ascending colon   PUD (peptic ulcer disease)    Seizures ([place])    epilepsy well controlled    Past Surgical History:  Procedure  Laterality Date   BREAST EXCISIONAL BIOPSY Left 2003   positive   BREAST EXCISIONAL BIOPSY Left 2004   positive   BREAST LUMPECTOMY Left 2003   BREAST CA   BREAST LUMPECTOMY Left 2004   BREAST CA   HEMICOLECTOMY Right    bowel obstruction   HEMORRHOID SURGERY     THORACOTOMY/LOBECTOMY  1999   carcinoid tumor   TOTAL VAGINAL HYSTERECTOMY     VARICOSE VEIN SURGERY      Prior to Admission medications   Medication Sig Start Date End Date Taking? Authorizing Provider  acetaminophen (TYLENOL) 500 MG tablet Take 500 mg by mouth every 6 (six) hours as needed.    [provider]  albuterol (ACCUNEB) 1.25 MG/3ML nebulizer solution Inhale 1 ampule into the lungs 4 (four) times daily as needed. 05/06/14   [provider]  amLODipine (NORVASC) 5 MG tablet TAKE 1 TABLET BY MOUTH EVERY DAY Patient taking differently: Take 2.5 mg by mouth daily. 02/26/18   Glean Hess, MD  azelastine (ASTELIN) 0.1 % nasal spray U 1 SPRAY IEN BID TO REPLACE PATANASE 11/24/18   [provider]  azithromycin (ZITHROMAX) 250 MG tablet Take 250 mg by mouth daily.  Patient not taking: Reported on 09/04/2021 03/07/19   [provider]  budesonide (PULMICORT) 0.5 MG/2ML nebulizer solution Inhale 0.5 mg into the lungs 2 (two) times daily as needed. 01/05/19 09/04/21  [provider]  Cholecalciferol 25 MCG (1000 UT) tablet Take 1,000 Units by mouth daily.  Patient not taking: Reported on 10/19/2020 07/09/19   [provider]  diltiazem (CARDIZEM LA) 180 MG 24 hr tablet Take 180 mg by mouth daily. 07/07/17 09/04/21  [provider]  ELIQUIS 2.5 MG TABS tablet TAKE 1 TABLET BY MOUTH TWICE DAILY 05/29/18   Glean Hess, MD  fluticasone [facility]) 50 MCG/ACT nasal spray Place 2 sprays into the nose daily.    [provider]  furosemide (LASIX) 20 MG tablet every other day. Caregiver reports currently taking 20 mg altering with 40 mg daily. 08/31/20   [provider]  levalbuterol Penne Lash) 0.63 MG/3ML nebulizer solution VVN Q 4 H PRN FOR [facility] 02/18/19   [provider]  levETIRAcetam (KEPPRA) 750 MG tablet Take 1 tablet by mouth 2 (two) times daily. 06/29/13   [provider]  montelukast (SINGULAIR) 10 MG tablet Take 1 tablet by mouth daily.    [provider]  pantoprazole (PROTONIX) 40 MG tablet TAKE 1 TABLET BY MOUTH TWICE DAILY Patient taking differently: daily. 11/05/18   Glean Hess, MD  predniSONE (DELTASONE) 2.5 MG tablet Take 2.5 mg by mouth daily with breakfast.    [provider]  spironolactone (ALDACTONE) 25 MG tablet Take 1 tablet by mouth daily. 08/31/20 09/04/21  [provider]    Family History  Problem Relation Age of Onset   Alcoholism Father    Diabetes Sister    Breast cancer Neg Hx      Social History   Tobacco Use   Smoking status: Former   Smokeless tobacco: Never  Vaping Use   Vaping Use: Never used  Substance Use Topics   Alcohol use: No    Alcohol/week: 0.0 standard drinks of alcohol   Drug use: No    Allergies as of 01/15/2022 - Review Complete 01/15/2022  Allergen Reaction Noted   Levofloxacin Shortness Of Breath 11/08/2014   Sulfa antibiotics Rash 11/08/2014    Review of Systems:    All systems reviewed and negative except where noted in HPI.   Physical Exam:  Vital signs in last 24 hours: Pulse Rate:  [109] 109 (07/18 0711) Resp:  [18] 18 (07/18 0711) BP: (116)/(78) 116/78 (07/18 0711) SpO2:  [98 %] 98 % (07/18 0711) Weight:  [78 kg] 78 kg (07/18 0800)   General:   Pleasant, cooperative in NAD Head:  Normocephalic and atraumatic. Eyes:   No icterus.   Conjunctiva pink. PERRLA. Ears:  Normal auditory acuity. Neck:  Supple; no masses or thyroidomegaly Lungs: Respirations even and unlabored. Lungs clear to auscultation bilaterally.   No wheezes, crackles, or rhonchi.  Heart:  Regular rate and rhythm;  Without murmur, clicks, rubs or  gallops Abdomen:  Soft, nondistended, nontender. Normal bowel sounds. No appreciable masses or hepatomegaly.  No rebound or guarding.  Neurologic:  Alert and oriented x3;  grossly normal neurologically. Skin:  Intact without significant lesions or rashes. Cervical Nodes:  No significant cervical adenopathy. Psych:  Alert and cooperative. Normal affect.  LAB RESULTS: No results for input(s): "WBC", "HGB", "HCT", "PLT" in the last 72 hours. BMET No results for input(s): "NA", "K", "CL", "CO2", "GLUCOSE", "BUN", "CREATININE", "CALCIUM" in the last 72 hours. LFT No results  for input(s): "PROT", "ALBUMIN", "AST", "ALT", "ALKPHOS", "BILITOT", "BILIDIR", "IBILI" in the last 72 hours. PT/INR No results for input(s): "LABPROT", "INR" in the last 72 hours.  STUDIES: No results found.    Impression / Plan:   Rebecca Wolf is a 88 y.o. y/o female with a history of PE on Eloquis, last dose taken last evening 01/14/2022 at 6 PM presented to the emergency room with epistaxis followed by coffee-ground emesis and melena..  On admission there was concern for tachycardia and tachypnea CT angiogram showed active bleeding from the lesser curvature of the stomach.  I intended to perform an urgent endoscopy on her and she was not throwing up any blood when I went to see her in the ER around 10:45 AM but subsequently before we could bring her up to the endoscopy unit started throwing up blood and having black tarry stools.  I discussed with the ER attending that she would need airway protection stabilized before any endoscopy evaluation can be performed as we cannot see if her stomach is full of clots and blood.  I also suggested that vascular surgery to be consulted for potential embolization of this bleeding vessel in the stomach.  Continue IV PPI monitor CBC and transfuse as needed.    Thank you for involving me in the care of this patient.      LOS: 0 days   Jonathon Bellows, MD  01/15/2022, 8:08 AM

## 2022-01-15 NOTE — ED Provider Notes (Signed)
[facility] Provider Note    Event Date/Time   First MD Initiated Contact with Patient 01/15/22 [phone]     (approximate)   History   Chief Complaint Epistaxis   HPI  Rebecca Wolf is a 88 y.o. female with past medical history of hypertension, diabetes, CKD, bronchiectasis, asthma, peptic ulcer disease, MGUS, seizures, and PE on Eliquis who presents to the ED complaining of epistaxis.  Per EMS patient developed bleeding from her nose earlier this morning and had a dark bowel movement as well.  When she arrived to the ED, no ongoing bleeding was noted, however patient asked to go to the bathroom and subsequently vomited bright red blood along with a small dark bowel movement.  When placed back in the stretcher, she had a large bloody bowel movement.  Daughter states that patient previously had a bleeding ulcer many years ago, but has not had issues since then.  She continues to take Eliquis, with her last dose coming around 6 PM last night.     Physical Exam   Triage Vital Signs: ED Triage Vitals  Enc Vitals Group     BP 01/15/22 0711 116/78     Pulse Rate 01/15/22 0711 (!) 109     Resp 01/15/22 0711 18     Temp --      Temp src --      SpO2 01/15/22 0711 98 %     Weight --      Height --      Head Circumference --      Peak Flow --      Pain Score 01/15/22 0659 0     Pain Loc --      Pain Edu? --      Excl. in [place]? --     Most recent vital signs: Vitals:   01/15/22 1030 01/15/22 1059  BP: 133/87   Pulse: 91   Resp: 15   Temp:  98.1 F (36.7 C)  SpO2: 100%     Constitutional: Alert and oriented. Eyes: Conjunctivae are normal. Head: Atraumatic. Nose: No congestion/rhinnorhea.  No active bleeding noted in either nare. Mouth/Throat: Mucous membranes are moist.  Bright red blood noted around mouth. Cardiovascular: Tachycardic, regular rhythm. Grossly normal heart sounds.  2+ radial pulses bilaterally. Respiratory: Tachypneic with increased  respiratory effort.  No retractions. Lungs CTAB. Gastrointestinal: Soft and nontender. No distention. Musculoskeletal: No lower extremity tenderness nor edema.  Neurologic:  Normal speech and language. No gross focal neurologic deficits are appreciated.    ED Results / Procedures / Treatments   Labs (all labs ordered are listed, but only abnormal results are displayed) Labs Reviewed  CBC WITH DIFFERENTIAL/PLATELET - Abnormal; Notable for the following components:      Result Value   WBC 15.4 (*)    RBC 3.80 (*)    Hemoglobin 9.2 (*)    HCT 30.7 (*)    MCH 24.2 (*)    Neutro Abs 7.9 (*)    Lymphs Abs 6.2 (*)    Monocytes Absolute 1.1 (*)    Abs Immature Granulocytes 0.13 (*)    All other components within normal limits  COMPREHENSIVE METABOLIC PANEL - Abnormal; Notable for the following components:   Glucose, Bld 241 (*)    BUN 32 (*)    Creatinine, Ser 1.13 (*)    Calcium 8.6 (*)    Total Protein 5.7 (*)    Albumin 2.8 (*)    Total Bilirubin <0.1 (*)  GFR, Estimated 45 (*)    All other components within normal limits  PROTIME-INR - Abnormal; Notable for the following components:   Prothrombin Time 17.2 (*)    INR 1.4 (*)    All other components within normal limits  HEMOGLOBIN AND HEMATOCRIT, BLOOD  TYPE AND SCREEN  PREPARE RBC (CROSSMATCH)  ABO/RH  TROPONIN I (HIGH SENSITIVITY)  TROPONIN I (HIGH SENSITIVITY)     EKG  ED ECG REPORT I, Blake Divine, the attending physician, personally viewed and interpreted this ECG.   Date: 01/15/2022  EKG Time: 8:28  Rate: 98  Rhythm: normal sinus rhythm  Axis: LAD  Intervals:nonspecific intraventricular conduction delay  ST&T Change: None  RADIOLOGY Chest x-ray reviewed and interpreted by me with no infiltrate, edema, or effusion.  PROCEDURES:  Critical Care performed: Yes, see critical care procedure note(s)  .Critical Care  Performed by: Blake Divine, MD Authorized by: Blake Divine, MD   Critical  care provider statement:    Critical care time (minutes):  30   Critical care time was exclusive of:  Separately billable procedures and treating other patients and teaching time   Critical care was necessary to treat or prevent imminent or life-threatening deterioration of the following conditions:  Shock (GI bleed)   Critical care was time spent personally by me on the following activities:  Development of treatment plan with patient or surrogate, discussions with consultants, evaluation of patient's response to treatment, examination of patient, ordering and review of laboratory studies, ordering and review of radiographic studies, ordering and performing treatments and interventions, pulse oximetry, re-evaluation of patient's condition and review of old charts   I assumed direction of critical care for this patient from another provider in my specialty: no     Care discussed with: admitting provider      MEDICATIONS ORDERED IN ED: Medications  0.9 %  sodium chloride infusion (Manually program via Guardrails IV Fluids) (has no administration in time range)  0.9 %  sodium chloride infusion (Manually program via Guardrails IV Fluids) (has no administration in time range)  pantoprazole (PROTONIX) injection 40 mg (40 mg Intravenous Given 01/15/22 0826)  lactated ringers bolus 1,000 mL (0 mLs Intravenous Stopped 01/15/22 0939)  prothrombin complex conc human (KCENTRA) IVPB 4,206 Units (0 Units Intravenous Stopped 01/15/22 0939)  iohexol (OMNIPAQUE) 350 MG/ML injection 100 mL (100 mLs Intravenous Contrast Given 01/15/22 0942)     IMPRESSION / MDM / [place] / ED COURSE  I reviewed the triage vital signs and the nursing notes.                              88 y.o. female with past medical history of hypertension, diabetes, CKD, bronchiectasis, asthma, peptic ulcer disease, seizures, MGUS, and PE on Eliquis who presents to the ED for bleeding from her mouth and nose as well as bloody bowel  movements starting this morning.  Patient's presentation is most consistent with acute presentation with potential threat to life or bodily function.  Differential diagnosis includes, but is not limited to, epistaxis, upper GI bleed, lower GI bleed, anemia, aspiration.  Patient ill-appearing, tachycardic and tachypneic with blood around her nose and mouth as well as pooling blood around her bottom.  Bleeding does not appear to be coming from her nasal area and I am concerned for upper GI bleed, especially given her history of peptic ulcer disease.  We will treat with IV Protonix and reverse her  Eliquis given significant bleeding.  Blood pressure remained stable and we will hold off on emergent blood administration but crossmatch 2 units of PRBCs, low suspicion to initiate emergent blood if condition worsens.  Patient noted to have worsening tachycardia, slow to respond to questions, and decision was made to proceed with 2 units uncrossed matched PRBCs.  She also received 1 L IV fluid bolus, mental status improving along with tachycardia after initial unit of PRBCs.  Case discussed with Dr. Vicente Males of GI, who recommends proceeding with CTA of her abdomen/pelvis.  This is remarkable for signs of active extravasation into the lesser curvature of her stomach.  Initial plan was for endoscopy with Dr. Vicente Males as patient clinically improved, however she had large volume of hematemesis, approximately 500 cc, just before being taken to endoscopy.  This may have been the blood products seen in her stomach on CT imaging as patient reports she now feels better after hematemesis.  However, Dr. Vicente Males prefers to hold off on endoscopy at least until this afternoon, recommends admission to ICU with vascular surgery consult.  I spoke with Dr. Delana Meyer of vascular surgery, who prefers to hold off on embolization of the stomach until after EGD performed if unsuccessful.  Case discussed with ICU team for admission.      FINAL  CLINICAL IMPRESSION(S) / ED DIAGNOSES   Final diagnoses:  Upper GI bleed  Anticoagulated     Rx / DC Orders   ED Discharge Orders     None        Note:  This document was prepared using Dragon voice recognition software and may include unintentional dictation errors.   Blake Divine, MD 01/15/22 1121

## 2022-01-15 NOTE — Op Note (Signed)
[facility]  Percutaneous Study/Intervention Procedural Note     Surgeon(s): Mudlogger: none  Pre-operative Diagnosis: 1.  Upper GI bleed 2.  Hypotension secondary to blood loss anemia  Post-operative diagnosis:  Same  Procedure(s) Performed:             1.  Ultrasound guidance for vascular access right femoral artery             2.  Catheter placement into the left gastric artery third order catheter placement, the left splenic artery additional third order catheter placement and the right gastric artery additional third order catheter placement             3.  Aortogram and selective angiogram of the left and right gastric arteries as well as the splenic artery             4.  Microbead embolization of left gastric and the right gastric with 500-700  polyvinyl alcohol beads.  Additional coil embolization of the left gastric with 3 Ruby coils             5.  StarClose closure device right femoral artery  Anesthesia: Continuous ECG pulse oximetry and cardiopulmonary monitoring was performed throughout the entire procedure by the interventional radiology nurse however given her age and hypotension we did not administer any conscious sedation.    EBL: 10 cc  Fluoro Time: 6.8 minutes  Contrast: 50 cc              Indications:  Patient is a 88 y.o.female with brisk lower GI bleeding with resultant anemia. The patient has a CT angiogram showing what appears to be extravasation from the lesser curve suggesting the left gastric is the origin. The patient is brought in for angiography for further evaluation and potential treatment given her life-threatening hemorrhage. Risks and benefits are discussed and informed consent is obtained  Procedure:  The patient was identified and appropriate procedural time out was performed.  The patient was then placed supine on the table and prepped and draped in the usual sterile fashion. Moderate conscious  sedation was administered during a face to face encounter with the patient throughout the procedure with my supervision of the RN administering medicines and monitoring the patient's vital signs, pulse oximetry, telemetry and mental status throughout from the start of the procedure until the patient was taken to the recovery room.  Ultrasound was used to evaluate the right common femoral artery.  It was patent .  A digital ultrasound image was acquired.  A micropuncture needle was used to access the right common femoral artery under direct ultrasound guidance and a permanent image was performed.  A microwire was advanced under fluoroscopic guidance followed by the microsheath.  A 0.035 J wire was advanced without resistance and a 5Fr sheath was placed.  Pigtail catheter was placed into the aorta and an AP aortogram was performed. This demonstrated the approximate location of the celiac artery and we transitioned to the lateral projection to cannulate the celiac axis. A V S1 catheter was used to selectively cannulate the celiac axis.  Hand-injection contrast was then performed in the AP projection with a slight LAO and cranial angle added and this demonstrated the origin of the left gastric artery. Based on her continued bleeding and the nuclear medicine study I elected to treat this area with embolization. I initially advanced the Pro-Great microcatheter was negotiated out the left gastric artery and hand-injection of contrast was  performed which demonstrated a very rapid somewhat diffuse blush.  Given her presentation as well as the CT findings and her hypotension I elected to treat this area.  I instilled approximately 1/2 cc of 500 to 700 m PVC beads in this location.  Next, I placed a 3 x 6 Ruby coil followed by two 3 x 15 Ruby coils.  Angiogram following this showed the main vessel to be blocked appropriately. I then pulled back and cannulated the splenic artery with the Pro-great microcatheter without  difficulty.  Hand-injection contrast did not demonstrate any significant blush or filling of the greater curve.  I then selected the common hepatic artery with a prograde catheter and negotiated the prograde into the right gastric artery and selective imaging was performed which showed a similar blush as before but much less prominent.  I administered 1/2 cc of 500 700 m polyvinyl alcohol beads were deployed in the right gastric. Again, completion angiogram showed the main vessels to be open with less brisk filling. I elected to terminate the procedure. The diagnostic catheter was removed. StarClose closure device was deployed in usual fashion with excellent hemostatic result. The patient was taken to the recovery room in stable condition having tolerated the procedure well.     Findings: The celiac axis and the 3 main branches; the left gastric, the splenic and the hepatic with the right gastric branching off of it were all patent.  There appeared to be a very prominent blush from the left gastric and a lesser blush from the right gastric and these were both treated with embolization as above.  Disposition: Patient was taken to the recovery room in stable condition having tolerated the procedure well.  Complications:  None  Rebecca Wolf 01/15/2022 2:08 PM   This note was created with Dragon Medical transcription system. Any errors in dictation are purely unintentional.

## 2022-01-15 NOTE — Progress Notes (Signed)
Admission profile updated. ?

## 2022-01-15 NOTE — ED Notes (Signed)
Full linen, brief, and gown changed at this time. Pt has large bloody stool.

## 2022-01-15 NOTE — H&P (View-Only) (Signed)
[facility] Vascular Consult Note  MRN : [id number]  Rebecca Wolf is a 88 y.o. (05/15/1925) female who presents with chief complaint of  Chief Complaint  Patient presents with   GI Bleeding  .   Consulting Physician:Kurian Kasa, MD Reason for consult: Gastrointestinal bleed History of Present Illness: Rebecca Wolf is a 88 year old female who has a previous medical history of peptic ulcer disease, chronic kidney disease, seizures, diabetes, hypertension, and asthma that presented to [facility] due to a nosebleed.  She was also noted to have dark tarry stools.  During her time in the emergency room the patient began to vomit bright red blood.  The patient also has a history of a bleeding ulcer years ago.  The patient remains on Eliquis 2.5 mg twice daily as maintenance following her pulmonary embolism years ago.  The patient was given Kcentra to reverse Eliquis today at 9 AM.  Initially there was plan to have the patient undergo endoscopy however she began having large volumes of hematemesis which would likely make endoscopy visualization difficult.  Based on this decision was made for the patient undergo embolization.  Current Facility-Administered Medications  Medication Dose Route Frequency Provider Last Rate Last Admin   [MAR Hold] 0.9 %  sodium chloride infusion (Manually program via Guardrails IV Fluids)   Intravenous Once Blake Divine, MD       [facility] Hold] 0.9 %  sodium chloride infusion (Manually program via Guardrails IV Fluids)   Intravenous Once Blake Divine, MD       [facility] Hold] 0.9 %  sodium chloride infusion  250 mL Intravenous PRN Flora Lipps, MD       [facility] Hold] acetaminophen (TYLENOL) tablet 650 mg  650 mg Oral Q4H PRN Flora Lipps, MD       [facility] Hold] ceFAZolin (ANCEF) IVPB 2g/100 mL premix  2 g Intravenous On Call to OR Schnier, Dolores Lory, MD       [MAR Hold] docusate sodium (COLACE) capsule 100 mg  100 mg Oral BID PRN Flora Lipps, MD       [facility] Hold] ondansetron (ZOFRAN) injection 4 mg  4 mg Intravenous Q6H PRN Flora Lipps, MD       [MAR Hold] pantoprazole (PROTONIX) injection 40 mg  40 mg Intravenous Q12H Bradly Bienenstock, NP       [MAR Hold] pantoprazole (PROTONIX) injection 40 mg  40 mg Intravenous Once Darel Hong D, NP       pantoprozole (PROTONIX) 80 mg /NS 100 mL infusion  8 mg/hr Intravenous Continuous Bradly Bienenstock, NP       [MAR Hold] polyethylene glycol (MIRALAX / GLYCOLAX) packet 17 g  17 g Oral Daily PRN Flora Lipps, MD       [facility] Hold] sodium chloride flush (NS) 0.9 % injection 3 mL  3 mL Intravenous Q12H Flora Lipps, MD       [facility] Hold] sodium chloride flush (NS) 0.9 % injection 3 mL  3 mL Intravenous PRN Flora Lipps, MD        Past Medical History:  Diagnosis Date   Asthma    Breast cancer ([place]) 2003   LT LUMPECTOMY   Breast cancer ([place]) 2004   LT LUMPECTOMY   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   GERD (gastroesophageal reflux disease)    Hemorrhoids    Hypertension    Personal history of chemotherapy 2004   BREAST CA   Personal history of radiation  therapy 2003   BREAST CA   Personal history of radiation therapy 2004   BREAST CA   Polyp of colon 2002   bleeding polyp of right ascending colon   PUD (peptic ulcer disease)    Seizures ([place])    epilepsy well controlled    Past Surgical History:  Procedure Laterality Date   BREAST EXCISIONAL BIOPSY Left 2003   positive   BREAST EXCISIONAL BIOPSY Left 2004   positive   BREAST LUMPECTOMY Left 2003   BREAST CA   BREAST LUMPECTOMY Left 2004   BREAST CA   HEMICOLECTOMY Right    bowel obstruction   HEMORRHOID SURGERY     THORACOTOMY/LOBECTOMY  1999   carcinoid tumor   TOTAL VAGINAL HYSTERECTOMY     VARICOSE VEIN SURGERY      Social History Social History   Tobacco Use   Smoking status: Former   Smokeless tobacco: Never  Scientific laboratory technician Use: Never used  Substance Use Topics   Alcohol use:  No    Alcohol/week: 0.0 standard drinks of alcohol   Drug use: No    Family History Family History  Problem Relation Age of Onset   Alcoholism Father    Diabetes Sister    Breast cancer Neg Hx     Allergies  Allergen Reactions   Levofloxacin Shortness Of Breath   Sulfa Antibiotics Rash     REVIEW OF SYSTEMS (Negative unless checked)  Constitutional: '[]'$ Weight loss  '[]'$ Fever  '[]'$ Chills Cardiac: '[]'$ Chest pain   '[]'$ Chest pressure   '[]'$ Palpitations   '[]'$ Shortness of breath when laying flat   '[]'$ Shortness of breath at rest   '[]'$ Shortness of breath with exertion. Vascular:  '[]'$ Pain in legs with walking   '[]'$ Pain in legs at rest   '[]'$ Pain in legs when laying flat   '[]'$ Claudication   '[]'$ Pain in feet when walking  '[]'$ Pain in feet at rest  '[]'$ Pain in feet when laying flat   '[]'$ History of DVT   '[]'$ Phlebitis   '[]'$ Swelling in legs   '[]'$ Varicose veins   '[]'$ Non-healing ulcers Pulmonary:   '[]'$ Uses home oxygen   '[]'$ Productive cough   '[]'$ Hemoptysis   '[]'$ Wheeze  '[]'$ COPD   '[]'$ Asthma Neurologic:  '[]'$ Dizziness  '[]'$ Blackouts   '[]'$ Seizures   '[]'$ History of stroke   '[]'$ History of TIA  '[]'$ Aphasia   '[]'$ Temporary blindness   '[]'$ Dysphagia   '[]'$ Weakness or numbness in arms   '[]'$ Weakness or numbness in legs Musculoskeletal:  '[]'$ Arthritis   '[]'$ Joint swelling   '[]'$ Joint pain   '[]'$ Low back pain Hematologic:  '[]'$ Easy bruising  '[]'$ Easy bleeding   '[]'$ Hypercoagulable state   '[]'$ Anemic  '[]'$ Hepatitis Gastrointestinal:  '[x]'$ Blood in stool   '[x]'$ Vomiting blood  '[]'$ Gastroesophageal reflux/heartburn   '[]'$ Difficulty swallowing. Genitourinary:  '[]'$ Chronic kidney disease   '[]'$ Difficult urination  '[]'$ Frequent urination  '[]'$ Burning with urination   '[]'$ Blood in urine Skin:  '[]'$ Rashes   '[]'$ Ulcers   '[]'$ Wounds Psychological:  '[]'$ History of anxiety   '[]'$  History of major depression.  Physical Examination  Vitals:   01/15/22 1059 01/15/22 1116 01/15/22 1258 01/15/22 1300  BP:  117/80 128/79   Pulse:  (!) 116 (!) 110   Resp:  (!) 22 (!) 27   Temp: 98.1 F (36.7 C)   (!) 97.5 F (36.4 C)   TempSrc: Oral   Oral  SpO2:  99% 99%   Weight:   76.2 kg   Height:   '5\' 4"'$  (1.626 m)    Body mass index is 28.84 kg/m. Gen:  WD/WN, NAD Head: [place]/AT,  No temporalis wasting. Prominent temp pulse not noted. Ear/Nose/Throat: Hearing grossly intact, nares w/o erythema or drainage, oropharynx w/o Erythema/Exudate Eyes: Sclera non-icteric, conjunctiva clear Neck: Trachea midline.  No JVD.  Pulmonary:  Good air movement, respirations not labored, equal bilaterally.  Cardiac: RRR, normal S1, S2. Vascular: 2+ edema bilaterally  Gastrointestinal: soft, non-tender/non-distended. No guarding/reflex.  Musculoskeletal: M/S 5/5 throughout.  Extremities without ischemic changes.  No deformity or atrophy. No edema. Neurologic: Sensation grossly intact in extremities.  Symmetrical.  Speech is fluent. Motor exam as listed above. Psychiatric: Judgment intact, Mood & affect appropriate for pt's clinical situation. Dermatologic: No rashes or ulcers noted.  No cellulitis or open wounds. Lymph : No Cervical, Axillary, or Inguinal lymphadenopathy.    CBC Lab Results  Component Value Date   WBC 15.4 (H) 01/15/2022   HGB 12.0 01/15/2022   HCT 37.4 01/15/2022   MCV 80.8 01/15/2022   PLT 247 01/15/2022    BMET    Component Value Date/Time   NA 141 01/15/2022 0755   NA 138 10/11/2014 0920   K 4.0 01/15/2022 0755   K 4.3 10/11/2014 0920   CL 110 01/15/2022 0755   CL 104 10/11/2014 0920   CO2 23 01/15/2022 0755   CO2 24 10/11/2014 0920   GLUCOSE 241 (H) 01/15/2022 0755   GLUCOSE 145 (H) 10/11/2014 0920   BUN 32 (H) 01/15/2022 0755   BUN 26 (H) 10/11/2014 0920   CREATININE 1.13 (H) 01/15/2022 0755   CREATININE 1.13 (H) 10/11/2014 0920   CALCIUM 8.6 (L) 01/15/2022 0755   CALCIUM 9.5 10/11/2014 0920   GFRNONAA 45 (L) 01/15/2022 0755   GFRNONAA 43 (L) 10/11/2014 0920   GFRAA 50 (L) 08/25/2019 1114   GFRAA 50 (L) 10/11/2014 0920   Estimated Creatinine Clearance: 29.8 mL/min (A) (by C-G formula  based on SCr of 1.13 mg/dL (H)).  COAG Lab Results  Component Value Date   INR 1.4 (H) 01/15/2022    Radiology CT Angio Abd/Pel W and/or Wo Contrast  Result Date: 01/15/2022 CLINICAL DATA:  GI bleeding. EXAM: CTA ABDOMEN AND PELVIS WITHOUT AND WITH CONTRAST TECHNIQUE: Multidetector CT imaging of the abdomen and pelvis was performed using the standard protocol during bolus administration of intravenous contrast. Multiplanar reconstructed images and MIPs were obtained and reviewed to evaluate the vascular anatomy. RADIATION DOSE REDUCTION: This exam was performed according to the departmental dose-optimization program which includes automated exposure control, adjustment of the mA and/or kV according to patient size and/or use of iterative reconstruction technique. CONTRAST:  174m OMNIPAQUE IOHEXOL 350 MG/ML SOLN COMPARISON:  Chest CT-06/29/2017; 01/23/2017 FINDINGS: VASCULAR Aorta: There is a moderate amount of mixed calcified and noncalcified atherosclerotic plaque within a markedly tortuous but normal caliber abdominal aorta, not resulting in a hemodynamically significant stenosis. No evidence of abdominal aortic dissection or perivascular stranding. Celiac: There is a minimal amount of mixed calcified and noncalcified atherosclerotic plaque involving the origin of the celiac artery, not resulting in a hemodynamically significant stenosis. The left gastric artery has a conventional takeoff from the celiac trunk. SMA: Minimal amount of noncalcified atherosclerotic plaque involves the origin of the SMA as well as the main trunk of the SMA, not resulting in hemodynamically significant stenosis. The major branch vessels of the SMA appear widely patent. Conventional branching pattern. No discrete lumen filling defects to suggest distal embolism. Renals: Solitary bilaterally noncalcified atherosclerotic plaque involves the origin of the right renal artery approaching 50% luminal narrowing (coronal image 57,  series 12). There is a  minimal amount of mixed calcified and noncalcified atherosclerotic plaque involving the origin of the left renal artery, not resulting in hemodynamically significant stenosis. No vessel irregularity to suggest FMD. IMA: Diseased at its origin though remains patent. Inflow: There is a minimal amount of eccentric calcified atherosclerotic plaque involving the bilateral common iliac arteries, not resulting in hemodynamically significant stenosis. The bilateral internal iliac arteries are disease though patent and of normal caliber. The bilateral external iliac arteries are of normal caliber and widely patent without hemodynamically significant narrowing. Proximal Outflow: There is a minimal amount of calcified atherosclerotic plaque involving the bilateral common femoral arteries, not resulting in hemodynamically significant stenosis. The imaged portions of the bilateral superficial and deep femoral arteries are of normal caliber and widely patent throughout their imaged courses. Veins: The IVC and pelvic venous systems appear widely patent. Review of the MIP images confirms the above findings. _________________________________________________________ NON-VASCULAR Lower chest: Limited visualization of the lower thorax demonstrates minimal bibasilar dependent subsegmental atelectasis. No focal airspace opacities. No pleural effusion. Note is made of a punctate (approximately 3 mm) right lower lobe pulmonary nodule (image 4, series 6) which is unchanged compared to remote chest CT performed 03/2016 and thus of benign etiology. Hepatobiliary: Normal hepatic contour. No discrete hepatic lesions. Normal appearance of the gallbladder given degree distention. No radiopaque gallstones. No intra or extrahepatic biliary ductal dilatation. No ascites. Pancreas: Normal appearance of the pancreas. Spleen: Normal appearance of the spleen. Adrenals/Urinary Tract: There is symmetric enhancement and excretion of  the bilateral kidneys. Note is made of an approximately 1.6 cm hypoattenuating right-sided renal lesion which is incompletely characterized on the present examination and while potentially representative of a minimally complex cyst appears to demonstrate minimal enhancement (axial image 66, series 16; image 61, series 9 and image 27, series 4). There is a approximally 2.0 cm hypoattenuating partially exophytic nonenhancing cyst arising from the inferior pole of the right kidney (image 84, series 16). No discrete left-sided renal lesions. Calcifications about the bilateral renal hila may be vascular in etiology. No urinary obstruction or perinephric stranding. Normal appearance the bilateral adrenal glands. Normal appearance of the urinary bladder given degree of distention. Stomach/Bowel: There is an ill-defined area of intraluminal active extravasation about the lesser curvature of the stomach (axial image 20, series 9; coronal image 57, series 6), with pooling seen on the delayed phase imaging (representative axial image 22, series 16). There is a large amount of high density debris within the gastric lumen compatible with blood products. Large liquid stool burden within the colon without evidence of enteric obstruction. Postoperative change of the cecum. Normal appearance of the terminal ileum. No pneumoperitoneum, pneumatosis or portal venous gas. Lymphatic: No bulky retroperitoneal, mesenteric, pelvic or inguinal lymphadenopathy. Reproductive: Post hysterectomy. No discrete adnexal lesions. No free fluid the pelvic cul-de-sac. Other: Tiny mesenteric fat containing periumbilical hernia. Note is also made of a smaller midline ventral wall presumably incisional hernia. Subcutaneous edema about the midline of the low back. Musculoskeletal: No acute or aggressive osseous abnormalities. Moderate degenerative change the bilateral hips with joint space loss, subchondral sclerosis and osteophytosis. Moderate rotatory  scoliotic curvature of the lumbar spine with associated multilevel moderate to severe DDD, worse about the central concavity of the scoliotic curvature. IMPRESSION: VASCULAR 1. The examination is positive for acute intraluminal gastric bleeding centered at the lesser curvature of the stomach with large amount of blood products within the stomach. The etiology of the bleeding is not depicted on this examination and thus may  be secondary to a ulcer. Specifically, there is no stigmata of portal venous hypertension or definitive gastric or esophageal varices. Further evaluation and potential management with endoscopy is advised. 2. Moderate amount of calcified atherosclerotic plaque within a tortuous but normal caliber abdominal aorta, not resulting in hemodynamically significant stenosis. Aortic Atherosclerosis (ICD10-I70.0). 3. Approximately 50% luminal narrowing involving the origin of the right renal artery without associated asymmetric renal atrophy or delayed renal enhancement. NON-VASCULAR 1. Indeterminate approximately 1.6 cm hypoattenuating right-sided renal lesion, potentially representative of a minimally complex renal cyst though incompletely evaluated on the present examination. Comparison with prior outside examinations (if available), is advised. Otherwise, further evaluation with nonemergent abdominal MRI could performed as indicated. Critical Value/emergent results were called by telephone at the time of interpretation on 01/15/2022 at 10:22 am to provider [facility] , who verbally acknowledged these results. Electronically Signed   By: Sandi Mariscal M.D.   On: 01/15/2022 10:32   DG Chest Portable 1 View  Result Date: 01/15/2022 CLINICAL DATA:  Shortness of breath.  Nose bleed. EXAM: PORTABLE CHEST 1 VIEW COMPARISON:  06/29/2017 FINDINGS: Heart size is normal. Aorta is tortuous. Lungs are free of focal consolidations and pleural effusions. Mild spondylosis associated with scoliosis of the  thoracolumbar spine. Degenerative changes in both shoulders. IMPRESSION: No evidence for acute cardiopulmonary abnormality. Electronically Signed   By: Nolon Nations M.D.   On: 01/15/2022 08:10      Assessment/Plan 1.  Gastrointestinal bleed  CT scan shows evidence of gastric bleed.  Discussed with patient and family that we will attempt to perform coil embolization to possibly stop and/or at least decrease the amount of bleeding.  By doing this and may allow for EGD in the case that there may be an ulceration partially causing this issue.  If we are ultimately unsuccessful the patient would require evaluation by general surgery.  This was discussed with the patient and daughter.  We discussed risk, benefits and alternatives and they are agreeable to proceed.  2.  History of pulmonary embolism Patient was previously placed on Eliquis due to a previous history of pulmonary embolism.  In this instance because this pulmonary embolism was several years ago, there is no immediate risk to the patient at this time.  We do not believe IVC filter is warranted at this time.    Family Communication:  Thank you for allowing [place] to participate in the care of this patient.   Kris Hartmann, NP [facility] [phone] (Office Phone) [phone] (Office Fax) [phone] (Pager)  01/15/2022 1:03 PM  Staff may message me via secure chat in [place]  but this may not receive immediate response,  please page for urgent matters!  Dictation software was used to generate the above note. Typos may occur and escape review, as with typed/written notes. Any error is purely unintentional.  Please contact me directly for clarity if needed.

## 2022-01-15 NOTE — H&P (Signed)
NAME:  Rebecca Wolf, MRN:  [id number], DOB:  Nov 27, 1924, LOS: 0 ADMISSION DATE:  01/15/2022, CONSULTATION DATE:  01/15/2022 REFERRING MD:  Dr. Charna Archer, CHIEF COMPLAINT:  Epistaxis, hematemesis/melena   Brief Pt Description / Synopsis:  88 year old female with a history most significant for PE on Eliquis and peptic ulcer disease admitted with acute upper GI bleed.  GI and vascular surgery consulted.  Vascular surgery plans for embolization.  History of Present Illness:  Rebecca Wolf is a 88 year old female with a past medical history significant for PE on Eliquis, hypertension, CHF with mildly reduced systolic dysfunction (EF 57%), peptic ulcer disease, seizures, CKD, bronchiectasis, asthma who presents to [facility] ED on 01/15/2022 due to complaints of epistaxis and melena.  History is obtained from patient's daughter at bedside.  Per daughter, the patient had an episode of dark dark stools yesterday evening x1.  This morning she was noted to have bleeding from her nose along with coffee-ground emesis.  Patient's daughter reports that the patient has had a poor appetite for several days.  She denies any known shortness of breath (more than baseline), chest pain, abdominal pain, fever, chills, dysuria.  After arrival to the ED, patient asked to go to the bathroom and had subsequent episode where she vomited bright red blood along with a small dark bowel movement.  ED Course: Initial Vital Signs: Temperature 98.1 F orally, pulse 109, blood pressure 116/78, respiratory rate 32, SPO2 98% on room air Significant Labs: Glucose 241, BUN 32, creatinine 1.13, albumin 2.8, high-sensitivity troponin 15, WBC 15.4, hemoglobin 9.2, hematocrit 30.7, prothrombin time 17.2, INR 1.4 Imaging Chest X-ray>>IMPRESSION: No evidence for acute cardiopulmonary abnormality CTA abdomen and pelvis>>IMPRESSION: VASCULAR 1. The examination is positive for acute intraluminal gastric bleeding centered at the lesser curvature of the  stomach with large amount of blood products within the stomach. The etiology of the bleeding is not depicted on this examination and thus may be secondary to a ulcer. Specifically, there is no stigmata of portal venous hypertension or definitive gastric or esophageal varices. Further evaluation and potential management with endoscopy is advised. 2. Moderate amount of calcified atherosclerotic plaque within a tortuous but normal caliber abdominal aorta, not resulting in hemodynamically significant stenosis. Aortic Atherosclerosis (ICD10-I70.0). 3. Approximately 50% luminal narrowing involving the origin of the right renal artery without associated asymmetric renal atrophy or delayed renal enhancement. NON-VASCULAR 1. Indeterminate approximately 1.6 cm hypoattenuating right-sided renal lesion, potentially representative of a minimally complex renal cyst though incompletely evaluated on the present examination. Comparison with prior outside examinations (if available), is advised. Otherwise, further evaluation with nonemergent abdominal MRI could performed as indicated. Medications Administered: Protonix bolus and drip, 1 L normal saline, 2 units of emergent blood, Kcentra  Initially blood pressure was stable, therefore decision was made to hold off on emergent blood.  However patient became more tachycardic with worsening of mental status, which she was given that units of emergent blood, along with 1 L of IV normal saline with improvement in mental status and tachycardia.  CTA of the abdomen and pelvis concerning for active extravasation into the lesser curvature of the stomach.  GI planed to perform EGD.  However patient then had episode of large-volume hematemesis (estimated to be approximately 500 cc), which vascular surgery is consulted with tentative plan for embolization.  PCCM is asked to admit to the ICU for further work-up and treatment.  Pertinent  Medical History   Past Medical  History:  Diagnosis Date   Asthma  Breast cancer ([place]) 2003   LT LUMPECTOMY   Breast cancer ([place]) 2004   LT LUMPECTOMY   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   GERD (gastroesophageal reflux disease)    Hemorrhoids    Hypertension    Personal history of chemotherapy 2004   BREAST CA   Personal history of radiation therapy 2003   BREAST CA   Personal history of radiation therapy 2004   BREAST CA   Polyp of colon 2002   bleeding polyp of right ascending colon   PUD (peptic ulcer disease)    Seizures ([place])    epilepsy well controlled    Micro Data:  N/A  Antimicrobials:  N/A  Significant Hospital Events: Including procedures, antibiotic start and stop dates in addition to other pertinent events   7/18: Presents to ED.  PCCM asked to admit.  GI and vascular surgery consulted, vascular surgery plans for embolization.  Interim History / Subjective:  -Patient presented to ED earlier this morning -Was to undergo EGD, however upon ED team arrival patient with large episode of hematemesis ~procedure was postponed and given 2 units of emergent blood -Upon examination in the ED, no further bleeding, hemodynamically stable, on room air, patient reports she is feeling much better -Vascular surgery with tentative plan for embolization  Objective   Blood pressure 117/80, pulse (!) 116, temperature 98.1 F (36.7 C), temperature source Oral, resp. rate (!) 22, weight 77.1 kg, SpO2 99 %.       No intake or output data in the 24 hours ending 01/15/22 1137 Filed Weights   01/15/22 0800 01/15/22 0801  Weight: 78 kg 77.1 kg    Examination: General: Acute on chronically ill-appearing female, sitting in bed, on room air, no acute distress HENT: Atraumatic, normocephalic, neck supple, JVD Lungs: Clear breath sounds bilaterally, even, nonlabored, normal effort Cardiovascular: Regular rate and rhythm, S1-S2, no murmurs, rubs, gallops Abdomen: Soft, nontender, nondistended,  no guarding rebound tenderness, bowel sounds positive x4 Extremities: Generalized weakness, 2+ edema bilateral lower extremities Neuro: Awake and alert, oriented x3, moves all extremities to command, no focal deficits, speech clear GU: Deferred  Resolved Hospital Problem list     Assessment & Plan:   Acute GI bleed Acute blood loss anemia CT angiogram abdomen and pelvis on admission: Concerning for active bleeding from the lesser curvature of the stomach (large amount of blood within the stomach), etiology of bleeding not depicted imaging -Monitor for S/Sx of bleeding -Trend CBC (follow H&H every 6 hours) -SCDs for VTE Prophylaxis (chemical prophylaxis contraindicated) -Transfuse for Hgb <8 ~received 2 units of emergent blood in the ED -Protonix bolus and drip -Eliquis reversed with Kcentra, follow coags -GI and vascular surgery consulted ~tentative plan for embolization with vascular surgery and eventual EGD  Leukocytosis, likely reactive -Monitor fever curve -Trend WBC's -Follow cultures as above -Obtain UA -Chest x-ray negative for infiltrate -Will hold off on empiric antibiotics  PMHx: Pulmonary embolism diagnosed 1998 per patient's daughter, asthma -Supplemental O2 as needed to maintain O2 sats >90% -Follow intermittent Chest X-ray & ABG as needed -Continue home Bronchodilators, Pulmicort nebs, and Symbicort -Pulmonary toilet as able -Eliquis held and reversed in setting of acute bleed -Vascular surgery following, appreciate input ~does not feel IVC filter is warranted at this  Time  Chronic heart failure with mildly reduced systolic dysfunction, not acutely exacerbated (LVEF 45% on echo 09/13/2021) PMHx: Hypertension -Continuous cardiac monitoring -Maintain MAP >65 -Transfusions as indicated -Vasopressors as needed to maintain MAP goal -  HS Troponin negative x2 (15 ~17) -Consider repeat echocardiogram -Hold home Norvasc, Lasix, and spironolactone due to potential for  hypotension with GI bleed  PMHx: Seizures -Continue home Keppra  Hyperglycemia -CBG's q4h; Target range of 140 to 180 -SSI -Follow ICU Hypo/Hyperglycemia protocol  -check hemoglobin A1c    Patient is acutely ill superimposed on multiple chronic comorbidities with advanced age.  Prognosis is guarded.  Recommend DNR/DNI status.   Best Practice (right click and "Reselect all SmartList Selections" daily)   Diet/type: NPO DVT prophylaxis: SCD GI prophylaxis: PPI Lines: N/A Foley:  N/A Code Status:  full code Last date of multidisciplinary goals of care discussion [7/18]  Updated patient's daughter in the ED 7/18.  Labs   CBC: Recent Labs  Lab 01/15/22 0755 01/15/22 1111  WBC 15.4*  --   NEUTROABS 7.9*  --   HGB 9.2* 12.0  HCT 30.7* 37.4  MCV 80.8  --   PLT 247  --     Basic Metabolic Panel: Recent Labs  Lab 01/15/22 0755  NA 141  K 4.0  CL 110  CO2 23  GLUCOSE 241*  BUN 32*  CREATININE 1.13*  CALCIUM 8.6*   GFR: CrCl cannot be calculated (Unknown ideal weight.). Recent Labs  Lab 01/15/22 0755  WBC 15.4*    Liver Function Tests: Recent Labs  Lab 01/15/22 0755  AST 19  ALT 11  ALKPHOS 83  BILITOT <0.1*  PROT 5.7*  ALBUMIN 2.8*   No results for input(s): "LIPASE", "AMYLASE" in the last 168 hours. No results for input(s): "AMMONIA" in the last 168 hours.  ABG No results found for: "PHART", "PCO2ART", "PO2ART", "HCO3", "TCO2", "ACIDBASEDEF", "O2SAT"   Coagulation Profile: Recent Labs  Lab 01/15/22 0755  INR 1.4*    Cardiac Enzymes: No results for input(s): "CKTOTAL", "CKMB", "CKMBINDEX", "TROPONINI" in the last 168 hours.  HbA1C: Hgb A1c MFr Bld  Date/Time Value Ref Range Status  10/18/2019 11:44 AM 7.2 (H) 4.8 - 5.6 % Final    Comment:    (NOTE) Pre diabetes:          5.7%-6.4% Diabetes:              >6.4% Glycemic control for   <7.0% adults with diabetes     CBG: No results for input(s): "GLUCAP" in the last 168  hours.  Review of Systems:   Positives in BOLD: Gen: Denies fever, chills, weight change, fatigue, night sweats HEENT: Denies blurred vision, double vision, hearing loss, tinnitus, sinus congestion, rhinorrhea, sore throat, neck stiffness, dysphagia PULM: Denies shortness of breath, cough, sputum production, hemoptysis, wheezing CV: Denies chest pain, edema, orthopnea, paroxysmal nocturnal dyspnea, palpitations GI: Denies abdominal pain, nausea, vomiting, diarrhea, coffee-ground emesis/hematemesis ,hematochezia, melena, constipation, change in bowel habits GU: Denies dysuria, hematuria, polyuria, oliguria, urethral discharge Endocrine: Denies hot or cold intolerance, polyuria, polyphagia or appetite change Derm: Denies rash, dry skin, scaling or peeling skin change Heme: Denies easy bruising, bleeding, bleeding gums Neuro: Denies headache, numbness, weakness, slurred speech, loss of memory or consciousness   Past Medical History:  She,  has a past medical history of Asthma, Breast cancer ([place]) (2003), Breast cancer ([place]) (2004), Carcinoid tumor determined by biopsy of lung (2001), GERD (gastroesophageal reflux disease), Hemorrhoids, Hypertension, Personal history of chemotherapy (2004), Personal history of radiation therapy (2003), Personal history of radiation therapy (2004), Polyp of colon (2002), PUD (peptic ulcer disease), and Seizures ([place]).   Surgical History:   Past Surgical History:  Procedure Laterality Date  BREAST EXCISIONAL BIOPSY Left 2003   positive   BREAST EXCISIONAL BIOPSY Left 2004   positive   BREAST LUMPECTOMY Left 2003   BREAST CA   BREAST LUMPECTOMY Left 2004   BREAST CA   HEMICOLECTOMY Right    bowel obstruction   HEMORRHOID SURGERY     THORACOTOMY/LOBECTOMY  1999   carcinoid tumor   TOTAL VAGINAL HYSTERECTOMY     VARICOSE VEIN SURGERY       Social History:   reports that she has quit smoking. She has never used smokeless tobacco. She reports that she  does not drink alcohol and does not use drugs.   Family History:  Her family history includes Alcoholism in her father; Diabetes in her sister. There is no history of Breast cancer.   Allergies Allergies  Allergen Reactions   Levofloxacin Shortness Of Breath   Sulfa Antibiotics Rash     Home Medications  Prior to Admission medications   Medication Sig Start Date End Date Taking? Authorizing Provider  acetaminophen (TYLENOL) 500 MG tablet Take 500 mg by mouth every 6 (six) hours as needed.   Yes [provider]  azelastine (ASTELIN) 0.1 % nasal spray U 1 SPRAY IEN BID TO REPLACE PATANASE 11/24/18  Yes [provider]  azithromycin (ZITHROMAX) 250 MG tablet Take 250 mg by mouth daily. 03/07/19  Yes [provider]  budesonide (PULMICORT) 0.5 MG/2ML nebulizer solution Inhale 0.5 mg into the lungs 2 (two) times daily as needed. 01/05/19 01/15/22 Yes [provider]  diltiazem (CARDIZEM LA) 180 MG 24 hr tablet Take 180 mg by mouth daily. 07/07/17 01/15/22 Yes [provider]  ELIQUIS 2.5 MG TABS tablet TAKE 1 TABLET BY MOUTH TWICE DAILY 05/29/18  Yes Glean Hess, MD  fluticasone [facility]) 50 MCG/ACT nasal spray Place 2 sprays into the nose daily.   Yes [provider]  levalbuterol (XOPENEX) 0.63 MG/3ML nebulizer solution VVN Q 4 H PRN FOR WHZ 02/18/19  Yes [provider]  levETIRAcetam (KEPPRA) 750 MG tablet Take 1 tablet by mouth 2 (two) times daily. 06/29/13  Yes [provider]  magnesium oxide (MAG-OX) 400 (240 Mg) MG tablet Take 1 tablet by mouth 2 (two) times daily. 12/18/21  Yes [provider]  montelukast (SINGULAIR) 10 MG tablet Take 1 tablet by mouth daily.   Yes [provider]  predniSONE (DELTASONE) 2.5 MG tablet Take 2.5 mg by mouth daily with breakfast.   Yes [provider]  spironolactone (ALDACTONE) 25 MG tablet Take 1 tablet by mouth daily. 08/31/20 01/15/22 Yes [provider]  albuterol (ACCUNEB) 1.25 MG/3ML nebulizer solution Inhale 1 ampule into the lungs 4 (four) times daily as needed. 05/06/14   [provider]  amLODipine (NORVASC) 5 MG tablet TAKE 1 TABLET BY MOUTH EVERY DAY Patient not taking: Reported on 01/15/2022 02/26/18   Glean Hess, MD  Cholecalciferol 25 MCG (1000 UT) tablet Take 1,000 Units by mouth daily.  Patient not taking: Reported on 10/19/2020 07/09/19   [provider]  furosemide (LASIX) 20 MG tablet every other day. Caregiver reports currently taking 20 mg altering with 40 mg daily. Patient not taking: Reported on 01/15/2022 08/31/20   [provider]  furosemide (LASIX) 40 MG tablet Take 40 mg by mouth daily. Patient not taking: Reported on 01/15/2022 09/06/21   [provider]  pantoprazole (PROTONIX) 40 MG tablet TAKE 1 TABLET BY MOUTH TWICE DAILY Patient taking differently: daily. 11/05/18   Glean Hess,  MD  potassium chloride SA (KLOR-CON M) 20 MEQ tablet Take 20 mEq by mouth 2 (two) times daily. Patient not taking: Reported on 01/15/2022 12/04/21   [provider]     Critical care time: 50 minutes     Darel Hong, AGACNP-BC [facility] epic messenger for cross cover needs If after hours, please call E-link

## 2022-01-15 NOTE — Consult Note (Signed)
PHARMACY CONSULT NOTE - FOLLOW UP  Pharmacy Consult for Electrolyte Monitoring and Replacement   Recent Labs: Potassium (mmol/L)  Date Value  01/15/2022 4.0  10/11/2014 4.3   Calcium (mg/dL)  Date Value  01/15/2022 8.6 (L)   Calcium, Total (mg/dL)  Date Value  10/11/2014 9.5   Albumin (g/dL)  Date Value  01/15/2022 2.8 (L)  03/30/2014 3.5   Sodium (mmol/L)  Date Value  01/15/2022 141  10/11/2014 138     Assessment: 88yo F w/ h/o HTN, DM, CKD, bronchiectasis, asthma, PUD, MGUS, seizures, and PE (on Eliquis PTA) now presenting to ED with epistaxis, coffee ground emesis, and melena with last dose of Eliquis 2.'5mg'$  6pm on 7/17. Pt was reversed with [facility] 7/18 ~0930. Pharmacy consulted for mgmt of electrolytes while in CCU.  Goal of Therapy:  [place] WNL  Plan:  Scr 1.13,  K 4. WNL, no repletion at this time. Next set of labs with 0500 labs.  Lorna Dibble ,PharmD Clinical Pharmacist 01/15/2022 12:45 PM

## 2022-01-16 ENCOUNTER — Encounter: Payer: Self-pay | Admitting: Vascular Surgery

## 2022-01-16 DIAGNOSIS — D72829 Elevated white blood cell count, unspecified: Secondary | ICD-10-CM | POA: Diagnosis present

## 2022-01-16 DIAGNOSIS — I5022 Chronic systolic (congestive) heart failure: Secondary | ICD-10-CM | POA: Diagnosis not present

## 2022-01-16 DIAGNOSIS — D62 Acute posthemorrhagic anemia: Secondary | ICD-10-CM | POA: Diagnosis not present

## 2022-01-16 DIAGNOSIS — N182 Chronic kidney disease, stage 2 (mild): Secondary | ICD-10-CM | POA: Diagnosis present

## 2022-01-16 DIAGNOSIS — K254 Chronic or unspecified gastric ulcer with hemorrhage: Secondary | ICD-10-CM | POA: Diagnosis not present

## 2022-01-16 DIAGNOSIS — E663 Overweight: Secondary | ICD-10-CM | POA: Diagnosis present

## 2022-01-16 LAB — BASIC METABOLIC PANEL
Anion gap: 4 — ABNORMAL LOW (ref 5–15)
BUN: 27 mg/dL — ABNORMAL HIGH (ref 8–23)
CO2: 25 mmol/L (ref 22–32)
Calcium: 8.1 mg/dL — ABNORMAL LOW (ref 8.9–10.3)
Chloride: 113 mmol/L — ABNORMAL HIGH (ref 98–111)
Creatinine, Ser: 1.01 mg/dL — ABNORMAL HIGH (ref 0.44–1.00)
GFR, Estimated: 51 mL/min — ABNORMAL LOW (ref >60)
Glucose, Bld: 150 mg/dL — ABNORMAL HIGH (ref 70–99)
Potassium: 4.5 mmol/L (ref 3.5–5.1)
Sodium: 142 mmol/L (ref 135–145)

## 2022-01-16 LAB — CBC
HCT: 30.8 % — ABNORMAL LOW (ref 36.0–46.0)
Hemoglobin: 10.1 g/dL — ABNORMAL LOW (ref 12.0–15.0)
MCH: 26.4 pg (ref 26.0–34.0)
MCHC: 32.8 g/dL (ref 30.0–36.0)
MCV: 80.4 fL (ref 80.0–100.0)
Platelets: 152 10*3/uL (ref 150–400)
RBC: 3.83 MIL/uL — ABNORMAL LOW (ref 3.87–5.11)
RDW: 15.5 % (ref 11.5–15.5)
WBC: 16 10*3/uL — ABNORMAL HIGH (ref 4.0–10.5)
nRBC: 0 % (ref 0.0–0.2)

## 2022-01-16 LAB — HEMOGLOBIN AND HEMATOCRIT, BLOOD
HCT: 29 % — ABNORMAL LOW (ref 36.0–46.0)
HCT: 32.3 % — ABNORMAL LOW (ref 36.0–46.0)
Hemoglobin: 10.7 g/dL — ABNORMAL LOW (ref 12.0–15.0)
Hemoglobin: 9.5 g/dL — ABNORMAL LOW (ref 12.0–15.0)

## 2022-01-16 LAB — PROTIME-INR
INR: 1.2 (ref 0.8–1.2)
Prothrombin Time: 15 seconds (ref 11.4–15.2)

## 2022-01-16 LAB — MAGNESIUM: Magnesium: 1.5 mg/dL — ABNORMAL LOW (ref 1.7–2.4)

## 2022-01-16 LAB — APTT: aPTT: 31 seconds (ref 24–36)

## 2022-01-16 LAB — PHOSPHORUS: Phosphorus: 3.8 mg/dL (ref 2.5–4.6)

## 2022-01-16 MED ORDER — ORAL CARE MOUTH RINSE: 15.0000 mL | OROMUCOSAL | Status: DC | PRN

## 2022-01-16 MED ORDER — MAGNESIUM SULFATE 2 GM/50ML IV SOLN
2.0000 g | Freq: Once | INTRAVENOUS | Status: AC
  Administered 2022-01-16: 2 g via INTRAVENOUS
  Filled 2022-01-16: qty 50

## 2022-01-16 NOTE — Assessment & Plan Note (Signed)
Meets criteria BMI greater than 25 

## 2022-01-16 NOTE — Progress Notes (Signed)
Rebecca Wolf , MD [address] [place], [place], [place], [address] 3940 [address], [place], [place], [place], [address] Phone: [phone]  Fax: [phone]   Rebecca Wolf is being followed for upper GI bleed  Day 1 of follow up   Subjective: No further hematemesis, no abdominal pain doing well wants to eat.    Objective: Vital signs in last 24 hours: Vitals:   01/16/22 0600 01/16/22 0739 01/16/22 0800 01/16/22 0900  BP: 114/75 121/66 122/67 119/63  Pulse: (!) 102  93   Resp: 16  (!) 7 (!) 24  Temp:  98 F (36.7 C)    TempSrc:  Oral    SpO2: 94%  94%   Weight:      Height:       Weight change:   Intake/Output Summary (Last 24 hours) at 01/16/2022 1141 Last data filed at 01/16/2022 0600 Gross per 24 hour  Intake 244.12 ml  Output 350 ml  Net -105.88 ml     Exam: Heart:: Regular rate and rhythm or S1S2 present Lungs: normal Abdomen: soft, nontender, normal bowel sounds   Lab Results: '@LABTEST2'$ @ Micro Results: Recent Results (from the past 240 hour(s))  MRSA Next Gen by PCR, Nasal     Status: None   Collection Time: 01/15/22  4:18 PM   Specimen: Nasal Mucosa; Nasal Swab  Result Value Ref Range Status   MRSA by PCR Next Gen NOT DETECTED NOT DETECTED Final    Comment: (NOTE) The GeneXpert MRSA Assay (FDA approved for NASAL specimens only), is one component of a comprehensive MRSA colonization surveillance program. It is not intended to diagnose MRSA infection nor to guide or monitor treatment for MRSA infections. Test performance is not FDA approved in patients less than 52 years old. Performed at [facility], [address] [place], [place] [address]    Studies/Results: PERIPHERAL VASCULAR CATHETERIZATION  Result Date: 01/15/2022 See surgical note for result.  CT Angio Abd/Pel W and/or Wo Contrast  Result Date: 01/15/2022 CLINICAL DATA:  GI bleeding. EXAM: CTA ABDOMEN AND PELVIS WITHOUT AND WITH CONTRAST TECHNIQUE: Multidetector CT  imaging of the abdomen and pelvis was performed using the standard protocol during bolus administration of intravenous contrast. Multiplanar reconstructed images and MIPs were obtained and reviewed to evaluate the vascular anatomy. RADIATION DOSE REDUCTION: This exam was performed according to the departmental dose-optimization program which includes automated exposure control, adjustment of the mA and/or kV according to patient size and/or use of iterative reconstruction technique. CONTRAST:  140m OMNIPAQUE IOHEXOL 350 MG/ML SOLN COMPARISON:  Chest CT-06/29/2017; 01/23/2017 FINDINGS: VASCULAR Aorta: There is a moderate amount of mixed calcified and noncalcified atherosclerotic plaque within a markedly tortuous but normal caliber abdominal aorta, not resulting in a hemodynamically significant stenosis. No evidence of abdominal aortic dissection or perivascular stranding. Celiac: There is a minimal amount of mixed calcified and noncalcified atherosclerotic plaque involving the origin of the celiac artery, not resulting in a hemodynamically significant stenosis. The left gastric artery has a conventional takeoff from the celiac trunk. SMA: Minimal amount of noncalcified atherosclerotic plaque involves the origin of the SMA as well as the main trunk of the SMA, not resulting in hemodynamically significant stenosis. The major branch vessels of the SMA appear widely patent. Conventional branching pattern. No discrete lumen filling defects to suggest distal embolism. Renals: Solitary bilaterally noncalcified atherosclerotic plaque involves the origin of the right renal artery approaching 50% luminal narrowing (coronal image 57, series 12). There is a minimal amount  of mixed calcified and noncalcified atherosclerotic plaque involving the origin of the left renal artery, not resulting in hemodynamically significant stenosis. No vessel irregularity to suggest FMD. IMA: Diseased at its origin though remains patent. Inflow:  There is a minimal amount of eccentric calcified atherosclerotic plaque involving the bilateral common iliac arteries, not resulting in hemodynamically significant stenosis. The bilateral internal iliac arteries are disease though patent and of normal caliber. The bilateral external iliac arteries are of normal caliber and widely patent without hemodynamically significant narrowing. Proximal Outflow: There is a minimal amount of calcified atherosclerotic plaque involving the bilateral common femoral arteries, not resulting in hemodynamically significant stenosis. The imaged portions of the bilateral superficial and deep femoral arteries are of normal caliber and widely patent throughout their imaged courses. Veins: The IVC and pelvic venous systems appear widely patent. Review of the MIP images confirms the above findings. _________________________________________________________ NON-VASCULAR Lower chest: Limited visualization of the lower thorax demonstrates minimal bibasilar dependent subsegmental atelectasis. No focal airspace opacities. No pleural effusion. Note is made of a punctate (approximately 3 mm) right lower lobe pulmonary nodule (image 4, series 6) which is unchanged compared to remote chest CT performed 03/2016 and thus of benign etiology. Hepatobiliary: Normal hepatic contour. No discrete hepatic lesions. Normal appearance of the gallbladder given degree distention. No radiopaque gallstones. No intra or extrahepatic biliary ductal dilatation. No ascites. Pancreas: Normal appearance of the pancreas. Spleen: Normal appearance of the spleen. Adrenals/Urinary Tract: There is symmetric enhancement and excretion of the bilateral kidneys. Note is made of an approximately 1.6 cm hypoattenuating right-sided renal lesion which is incompletely characterized on the present examination and while potentially representative of a minimally complex cyst appears to demonstrate minimal enhancement (axial image 66,  series 16; image 61, series 9 and image 27, series 4). There is a approximally 2.0 cm hypoattenuating partially exophytic nonenhancing cyst arising from the inferior pole of the right kidney (image 84, series 16). No discrete left-sided renal lesions. Calcifications about the bilateral renal hila may be vascular in etiology. No urinary obstruction or perinephric stranding. Normal appearance the bilateral adrenal glands. Normal appearance of the urinary bladder given degree of distention. Stomach/Bowel: There is an ill-defined area of intraluminal active extravasation about the lesser curvature of the stomach (axial image 20, series 9; coronal image 57, series 6), with pooling seen on the delayed phase imaging (representative axial image 22, series 16). There is a large amount of high density debris within the gastric lumen compatible with blood products. Large liquid stool burden within the colon without evidence of enteric obstruction. Postoperative change of the cecum. Normal appearance of the terminal ileum. No pneumoperitoneum, pneumatosis or portal venous gas. Lymphatic: No bulky retroperitoneal, mesenteric, pelvic or inguinal lymphadenopathy. Reproductive: Post hysterectomy. No discrete adnexal lesions. No free fluid the pelvic cul-de-sac. Other: Tiny mesenteric fat containing periumbilical hernia. Note is also made of a smaller midline ventral wall presumably incisional hernia. Subcutaneous edema about the midline of the low back. Musculoskeletal: No acute or aggressive osseous abnormalities. Moderate degenerative change the bilateral hips with joint space loss, subchondral sclerosis and osteophytosis. Moderate rotatory scoliotic curvature of the lumbar spine with associated multilevel moderate to severe DDD, worse about the central concavity of the scoliotic curvature. IMPRESSION: VASCULAR 1. The examination is positive for acute intraluminal gastric bleeding centered at the lesser curvature of the stomach  with large amount of blood products within the stomach. The etiology of the bleeding is not depicted on this examination and thus may be  secondary to a ulcer. Specifically, there is no stigmata of portal venous hypertension or definitive gastric or esophageal varices. Further evaluation and potential management with endoscopy is advised. 2. Moderate amount of calcified atherosclerotic plaque within a tortuous but normal caliber abdominal aorta, not resulting in hemodynamically significant stenosis. Aortic Atherosclerosis (ICD10-I70.0). 3. Approximately 50% luminal narrowing involving the origin of the right renal artery without associated asymmetric renal atrophy or delayed renal enhancement. NON-VASCULAR 1. Indeterminate approximately 1.6 cm hypoattenuating right-sided renal lesion, potentially representative of a minimally complex renal cyst though incompletely evaluated on the present examination. Comparison with prior outside examinations (if available), is advised. Otherwise, further evaluation with nonemergent abdominal MRI could performed as indicated. Critical Value/emergent results were called by telephone at the time of interpretation on 01/15/2022 at 10:22 am to provider [facility] , who verbally acknowledged these results. Electronically Signed   By: Sandi Mariscal M.D.   On: 01/15/2022 10:32   DG Chest Portable 1 View  Result Date: 01/15/2022 CLINICAL DATA:  Shortness of breath.  Nose bleed. EXAM: PORTABLE CHEST 1 VIEW COMPARISON:  06/29/2017 FINDINGS: Heart size is normal. Aorta is tortuous. Lungs are free of focal consolidations and pleural effusions. Mild spondylosis associated with scoliosis of the thoracolumbar spine. Degenerative changes in both shoulders. IMPRESSION: No evidence for acute cardiopulmonary abnormality. Electronically Signed   By: Nolon Nations M.D.   On: 01/15/2022 08:10   Medications: I have reviewed the patient's current medications. Scheduled Meds:  sodium chloride    Intravenous Once   sodium chloride   Intravenous Once   budesonide  0.5 mg Inhalation BID   Chlorhexidine Gluconate Cloth  6 each Topical Q0600   montelukast  10 mg Oral Daily   sodium chloride flush  3 mL Intravenous Q12H   sodium chloride flush  3 mL Intravenous Q12H   Continuous Infusions:  sodium chloride     sodium chloride     levETIRAcetam 500 mg (01/16/22 0859)   pantoprazole 8 mg/hr (01/16/22 1052)   PRN Meds:.sodium chloride, sodium chloride, acetaminophen, morphine injection, ondansetron (ZOFRAN) IV, ondansetron (ZOFRAN) IV, mouth rinse, oxyCODONE, sodium chloride flush, sodium chloride flush   Assessment: Principal Problem:   GI bleeding Active Problems:   GIB (gastrointestinal bleeding)   Pressure injury of skin  Arther Abbott. Age female admitted with hematemesis and melena.  Could not perform an EGD as she was having significant bleeding had to undergo a CT angiogram which showed a positive active bleeding in the stomach and underwent a subsequent embolization and since then hemoglobin has been stable and no clear evidence of any further bleed.  Plan: From the GI point of view would recommend 8 weeks of PPI can transition from IV PPI to oral PPI at discharge would recommend 72 hours of IV PPI which would be the therapy for a possible ulcer that had bled.  Differentials would be include Dieulafoy lesion  bleeding ulcer, AVM and also possible underlying malignancy . If not bleeding no indication to perform an endoscopy at this point of time risks exceed any benefits especially at her age.  If There is concern for the further bleed then would perform endoscopy. If not bleeding no concern to keep her n.p.o. from the GI standpoint Avoid all NSAIDs and as an outpatient will require H. pylori breath test off PPIs for a week  I will sign off.  Please call me if any further GI concerns or questions.  We would like to thank you for the opportunity  to participate in the care of  Rebecca Wolf.   LOS: 1 day   Rebecca Bellows, MD 01/16/2022, 11:41 AM

## 2022-01-16 NOTE — Hospital Course (Addendum)
88 year old female with past medical history of PE on Eliquis, hypertension, systolic heart failure with ejection fraction 45%, chronic kidney disease and seizures who presents to the emergency room on 7/18 with complaints of epistaxis and melena.  CT angiogram noted acute intraluminal gastric bleeding.  Initial blood pressure stable, but then patient started becoming tachycardic with mentation changes and she was transfused emergent blood as well as 1 L of normal saline.  Vascular surgery emergently took patient on 7/18 for microembolization of left gastric and right gastric vessels.  Gastroenterology consulted.  Since then, patient has been stable.  Vital signs stable no further episodes of either hematochezia or hematemesis.  Hemoglobin has been trending downward since admission.  Some of this was attributed to hemoconcentration as blood pressure and heart rate have been stable.  On 7/21, hemoglobin down to 6.8 and patient was transfused 2 units packed red blood cells.  By 7/22, hemoglobin up to 10.3 and no change in 7/23 and patient felt to be stable, with no further evidence of bleeding.

## 2022-01-16 NOTE — Assessment & Plan Note (Signed)
Stable, continue Keppra

## 2022-01-16 NOTE — Assessment & Plan Note (Signed)
Pressure Injury 01/15/22 Sacrum Medial Stage 2 -  Partial thickness loss of dermis presenting as a shallow open injury with a red, pink wound bed without slough. (Active)  01/15/22 1615  Location: Sacrum  Location Orientation: Medial  Staging: Stage 2 -  Partial thickness loss of dermis presenting as a shallow open injury with a red, pink wound bed without slough.  Wound Description (Comments):   Present on Admission: Yes    Stage II decubitus ulcer, present on admission.

## 2022-01-16 NOTE — Assessment & Plan Note (Addendum)
Secondary to blood loss anemia.  Status post blood transfusion and fluids.  Normalized by 7/21

## 2022-01-16 NOTE — Assessment & Plan Note (Addendum)
Stress margination brought on by bleed.  Procalcitonin normal.  Continues to improve.

## 2022-01-16 NOTE — Progress Notes (Signed)
[facility] Progress Note  Patient: Rebecca Wolf    YTK:[id number]  DOA: 01/15/2022    Date of Service: the patient was seen and examined on 01/16/2022  Brief hospital course: 88 year old female with past medical history of PE on Eliquis, hypertension, systolic heart failure with ejection fraction 45%, chronic kidney disease and seizures who presents to the emergency room on 7/18 with complaints of epistaxis and melena.  CT angiogram noted acute intraluminal gastric bleeding.  Initial blood pressure stable, but then patient started becoming tachycardic with mentation changes and she was transfused emergent blood as well as 1 L of normal saline.  Vascular surgery emergently took patient for microembolization of left gastric and right gastric vessels.  Gastroenterology consulted.  Assessment and Plan: Assessment and Plan: * GI bleeding History of PE on Eliquis.  Eliquis on hold.  Status post 1 unit packed red blood cell transfusion.  Patient with microembolization of left and right gastric vessels with "embolization of left gastric.  GI following although no need for EGD given stabilization by vascular surgery.  Acute blood loss anemia Baseline hemoglobin appears to be around 11, although was down to 8.9 in March.  Presented with hemoglobin of 9.2 on admission however this was hemoconcentrated so likely lower.  With fluids and additional blood, increased to 12, but has steadily declined over the last 24 hours to 9.5 with improvement in creatinine, therefore likely more hemoconcentrated.  We will recheck labs in the morning.  Chronic systolic CHF (congestive heart failure) (HCC) Initially dry.  We will check morning BNP given blood and fluids.  Diuretics on hold.  Acute kidney injury in the setting of CKD (chronic kidney disease), stage II Secondary to blood loss anemia.  Status post blood transfusion and fluids.  Creatinine continues to improve.  Not yet at baseline.  Leukocytosis Mild  improvement in previous day.  Checking procalcitonin.  Repeat labs in the morning.  Likely stress margination brought on by bleed.  Hypertension Holding home medications secondary to hypotension brought on by blood loss.  Seizure disorder ([place]) Stable, continue Keppra  Hypomagnesemia Replacing as needed  Pressure injury of skin Pressure Injury 01/15/22 Sacrum Medial Stage 2 -  Partial thickness loss of dermis presenting as a shallow open injury with a red, pink wound bed without slough. (Active)  01/15/22 1615  Location: Sacrum  Location Orientation: Medial  Staging: Stage 2 -  Partial thickness loss of dermis presenting as a shallow open injury with a red, pink wound bed without slough.  Wound Description (Comments):   Present on Admission: Yes    Stage II decubitus ulcer, present on admission.  Overweight (BMI 25.0-29.9) Meets criteria BMI greater than 25       Body mass index is 26.79 kg/m.    Pressure Injury 01/15/22 Sacrum Medial Stage 2 -  Partial thickness loss of dermis presenting as a shallow open injury with a red, pink wound bed without slough. (Active)  01/15/22 1615  Location: Sacrum  Location Orientation: Medial  Staging: Stage 2 -  Partial thickness loss of dermis presenting as a shallow open injury with a red, pink wound bed without slough.  Wound Description (Comments):   Present on Admission: Yes  Dressing Type Foam - Lift dressing to assess site every shift 01/16/22 0800     Consultants: Gastroenterology Critical care Vascular surgery  Procedures: Coil embolization of left and right gastric vessels  Antimicrobials: Preop Ancef  Code Status: Full code   Subjective: Doing okay, denies any  abdominal pain.  Thirsty and hungry  Objective: Blood pressure stable, tachycardic earlier Vitals:   01/16/22 1500 01/16/22 1600  BP: 102/60 (!) 120/59  Pulse: 94 87  Resp: (!) 25 (!) 28  Temp:    SpO2: 96% 98%    Intake/Output Summary (Last 24  hours) at 01/16/2022 1755 Last data filed at 01/16/2022 1600 Gross per 24 hour  Intake 440.29 ml  Output 300 ml  Net 140.29 ml   Filed Weights   01/15/22 1258 01/15/22 1615 01/16/22 0500  Weight: 76.2 kg 70.5 kg 70.8 kg   Body mass index is 26.79 kg/m.  Exam:  General: Alert and oriented x2, no acute distress HEENT: Normocephalic and atraumatic, mucous membranes are dry Cardiovascular: Regular rate and rhythm, S1-S2 Respiratory: Clear to auscultation bilaterally Abdomen: Soft, nontender, nondistended, positive bowel sounds Musculoskeletal: No clubbing or cyanosis, trace pitting edema Skin: No skin breaks, tears or lesions Psychiatry: Appropriate, no evidence of psychoses Neurology: No focal deficits  Data Reviewed: White blood cell count of 16.  Hemoglobin down to 9.5.  Creatinine improving.  Disposition:  Status is: Inpatient Remains inpatient appropriate because: Ensure stabilization of hemoglobin, improvement in white blood cell count and creatinine    Anticipated discharge date: 7/21  Family Communication: Daughter at the bedside DVT Prophylaxis: SCDs Start: 01/15/22 1130    Author: Annita Brod ,MD 01/16/2022 5:55 PM  To reach On-call, see care teams to locate the attending and reach out via [email or web address]. Between 7PM-7AM, please contact night-coverage If you still have difficulty reaching the attending provider, please page the [facility] (Director on Call) for [facility] on amion for assistance.

## 2022-01-16 NOTE — Assessment & Plan Note (Signed)
Holding home medications secondary to hypotension brought on by blood loss.

## 2022-01-16 NOTE — Consult Note (Signed)
[place] Nurse Consult Note: Reason for Consult:In to assess skin breakdown to coccyx.  Present on admission.  Daughter at bedside who is also caregiver.  She states patient is resistant to turning and pressure relief efforts athome. Has  a lift chair yet insists on sitting on couch.  They implemented a gel cushion which she also refuses.  Today, she is thirsty and requesting water.  I redirect and explain her NPO status and why and provide emotional support that we will give her something to drink as soon as possible.  Bedside RN performs mouth care while I am in the room.  Wound type:pressure injury  stage 3 Pressure Injury POA: Yes Measurement:nonblanchable erythema measures 2 cm x 1 cm with nonintact 0.2 cm center with a macerated punched out appearance.  Patient had loose, guaiac positive stools on admission and GI bleed. She underwent embolization procedure.  Wound WIO:[id number] Drainage (amount, consistency, odor) none noted Periwound:erythematous Dressing procedure/placement/frequency: Sacral foam dressing placed over wound.  Peel back and assess every shift.   CHange foam every three days and PRN soilage. Will not follow at this time.  Please re-consult if needed.  Domenic Moras MSN, RN, FNP-BC CWON Wound, Ostomy, Continence Nurse Pager [phone]

## 2022-01-16 NOTE — Assessment & Plan Note (Addendum)
History of PE on Eliquis.  Eliquis on hold.  Status post 1 unit packed red blood cell transfusion.  Patient with microembolization of left and right gastric vessels with "embolization of left gastric.  GI following although no need for EGD given stabilization by vascular surgery.

## 2022-01-16 NOTE — Consult Note (Signed)
PHARMACY CONSULT NOTE - FOLLOW UP  Pharmacy Consult for Electrolyte Monitoring and Replacement   Recent Labs: Potassium (mmol/L)  Date Value  01/16/2022 4.5  10/11/2014 4.3   Magnesium (mg/dL)  Date Value  01/16/2022 1.5 (L)   Calcium (mg/dL)  Date Value  01/16/2022 8.1 (L)   Calcium, Total (mg/dL)  Date Value  10/11/2014 9.5   Albumin (g/dL)  Date Value  01/15/2022 2.8 (L)  03/30/2014 3.5   Phosphorus (mg/dL)  Date Value  01/16/2022 3.8   Sodium (mmol/L)  Date Value  01/16/2022 142  10/11/2014 138     Assessment: 88yo F w/ h/o HTN, DM, CKD, bronchiectasis, asthma, PUD, MGUS, seizures, and PE (on Eliquis PTA) now presenting to ED with epistaxis, coffee ground emesis, and melena with last dose of Eliquis 2.'5mg'$  6pm on 7/17. Pt was reversed with [facility] 7/18 ~0930. Pharmacy consulted for mgmt of electrolytes while in CCU.  Goal of Therapy:  [place] WNL  Plan:  Scr 1.13>1.01 (BL 0.86 01/2021); UOP 0.37m/k/h w/ Net neutral fluid balance. K 4>4.5. WNL, no repletion at this time. Mg 1.5. Replace with MgSO4 2g IV x1dose Next set of labs with 0500 labs.  BLorna Dibble,PharmD Clinical Pharmacist 01/16/2022 8:22 AM

## 2022-01-16 NOTE — Assessment & Plan Note (Addendum)
Initially dry.  Despite getting blood and IV fluids and diuretics on hold, BNP within normal limits.  BNP rechecked on 7/23 and still normal despite receiving 2 units packed red blood cells on 7/21.

## 2022-01-16 NOTE — Assessment & Plan Note (Signed)
Replacing as needed 

## 2022-01-16 NOTE — Assessment & Plan Note (Addendum)
Baseline hemoglobin appears to be around 11, although was down to 8.9 in March.  Presented with hemoglobin of 9.2 on admission however this was hemoconcentrated so likely lower.  With fluids and additional blood, increased to 12, but has steadily declined and by 7/21 down to 6.9.  Transfused 2 units packed red blood cells, and by 7/22 up to 10.3.  On 7/23, hemoglobin unchanged and hemoglobin felt to be stable.

## 2022-01-17 DIAGNOSIS — I5022 Chronic systolic (congestive) heart failure: Secondary | ICD-10-CM | POA: Diagnosis not present

## 2022-01-17 DIAGNOSIS — K254 Chronic or unspecified gastric ulcer with hemorrhage: Secondary | ICD-10-CM | POA: Diagnosis not present

## 2022-01-17 DIAGNOSIS — D62 Acute posthemorrhagic anemia: Secondary | ICD-10-CM | POA: Diagnosis not present

## 2022-01-17 DIAGNOSIS — E663 Overweight: Secondary | ICD-10-CM

## 2022-01-17 DIAGNOSIS — N182 Chronic kidney disease, stage 2 (mild): Secondary | ICD-10-CM | POA: Diagnosis not present

## 2022-01-17 LAB — CBC
HCT: 24.9 % — ABNORMAL LOW (ref 36.0–46.0)
Hemoglobin: 8.1 g/dL — ABNORMAL LOW (ref 12.0–15.0)
MCH: 26.4 pg (ref 26.0–34.0)
MCHC: 32.5 g/dL (ref 30.0–36.0)
MCV: 81.1 fL (ref 80.0–100.0)
Platelets: 131 10*3/uL — ABNORMAL LOW (ref 150–400)
RBC: 3.07 MIL/uL — ABNORMAL LOW (ref 3.87–5.11)
RDW: 16 % — ABNORMAL HIGH (ref 11.5–15.5)
WBC: 13.2 10*3/uL — ABNORMAL HIGH (ref 4.0–10.5)
nRBC: 0.2 % (ref 0.0–0.2)

## 2022-01-17 LAB — URINALYSIS, COMPLETE (UACMP) WITH MICROSCOPIC
Bilirubin Urine: NEGATIVE
Glucose, UA: NEGATIVE mg/dL
Ketones, ur: NEGATIVE mg/dL
Leukocytes,Ua: NEGATIVE
Nitrite: NEGATIVE
Protein, ur: NEGATIVE mg/dL
Specific Gravity, Urine: 1.039 — ABNORMAL HIGH (ref 1.005–1.030)
pH: 5 (ref 5.0–8.0)

## 2022-01-17 LAB — PHOSPHORUS: Phosphorus: 2.7 mg/dL (ref 2.5–4.6)

## 2022-01-17 LAB — BRAIN NATRIURETIC PEPTIDE: B Natriuretic Peptide: 18.7 pg/mL (ref 0.0–100.0)

## 2022-01-17 LAB — BASIC METABOLIC PANEL
Anion gap: 2 — ABNORMAL LOW (ref 5–15)
BUN: 19 mg/dL (ref 8–23)
CO2: 25 mmol/L (ref 22–32)
Calcium: 7.9 mg/dL — ABNORMAL LOW (ref 8.9–10.3)
Chloride: 110 mmol/L (ref 98–111)
Creatinine, Ser: 0.91 mg/dL (ref 0.44–1.00)
GFR, Estimated: 58 mL/min — ABNORMAL LOW (ref >60)
Glucose, Bld: 119 mg/dL — ABNORMAL HIGH (ref 70–99)
Potassium: 4.2 mmol/L (ref 3.5–5.1)
Sodium: 137 mmol/L (ref 135–145)

## 2022-01-17 LAB — MAGNESIUM: Magnesium: 2.1 mg/dL (ref 1.7–2.4)

## 2022-01-17 LAB — PROCALCITONIN: Procalcitonin: <0.1 ng/mL

## 2022-01-17 NOTE — Evaluation (Signed)
Physical Therapy Evaluation Patient Details Name: Rebecca Wolf MRN: [id number] DOB: [birth year] Today's Date: 01/17/2022  History of Present Illness  Rebecca Wolf is a 88 year old female with a past medical history significant for PE on Eliquis, hypertension, CHF with mildly reduced systolic dysfunction (EF 42%), peptic ulcer disease, seizures, CKD, bronchiectasis, asthma who presents to [facility] ED on 01/15/2022 due to complaints of epistaxis and melena.  History is obtained from patient's daughter at bedside.   Clinical Impression  Patient received in bed, RN reports she has been wanting to get up. Patient is mod independent with bed mobility, transfers with min A and ambulated 8 feet with min guard. Requires cues for proximity to RW. She will continue to benefit from skilled PT while here to improve strength and functional independence.        Recommendations for follow up therapy are one component of a multi-disciplinary discharge planning process, led by the attending physician.  Recommendations may be updated based on patient status, additional functional criteria and insurance authorization.  Follow Up Recommendations Outpatient PT      Assistance Recommended at Discharge Frequent or constant Supervision/Assistance  Patient can return home with the following  A little help with walking and/or transfers;A little help with bathing/dressing/bathroom;Help with stairs or ramp for entrance;Assist for transportation;Assistance with cooking/housework    Equipment Recommendations None recommended by PT  Recommendations for Other Services       Functional Status Assessment Patient has had a recent decline in their functional status and demonstrates the ability to make significant improvements in function in a reasonable and predictable amount of time.     Precautions / Restrictions Precautions Precautions: Fall Restrictions Weight Bearing Restrictions: No      Mobility  Bed  Mobility Overal bed mobility: Modified Independent                  Transfers Overall transfer level: Needs assistance Equipment used: Rolling walker (2 wheels) Transfers: Sit to/from Stand Sit to Stand: Min assist                Ambulation/Gait Ambulation/Gait assistance: Min guard Gait Distance (Feet): 8 Feet Assistive device: Rolling walker (2 wheels) Gait Pattern/deviations: Step-through pattern, Decreased step length - right, Decreased step length - left, Trunk flexed Gait velocity: decr     General Gait Details: patient requires cues for proximity to [employer]    Modified Rankin (Stroke Patients Only)       Balance Overall balance assessment: Needs assistance Sitting-balance support: Feet supported Sitting balance-Leahy Scale: Good     Standing balance support: Bilateral upper extremity supported, During functional activity, Reliant on assistive device for balance Standing balance-Leahy Scale: Fair                               Pertinent Vitals/Pain Pain Assessment Pain Assessment: No/denies pain    Home Living Family/patient expects to be discharged to:: Private residence Living Arrangements: Children Available Help at Discharge: Family;Available 24 hours/day Type of Home: House Home Access: Stairs to enter Entrance Stairs-Rails: Psychiatric nurse of Steps: 4   Home Layout: One level Home Equipment: Rollator (4 wheels);BSC/3in1      Prior Function Prior Level of Function : Independent/Modified Independent             Mobility Comments: ambulates with rollator at baseline ADLs Comments: dtr  assists with bathing and dressing, meals, transportation     Hand Dominance        Extremity/Trunk Assessment   Upper Extremity Assessment Upper Extremity Assessment: Defer to OT evaluation    Lower Extremity Assessment Lower Extremity Assessment: Generalized  weakness    Cervical / Trunk Assessment Cervical / Trunk Assessment: Normal  Communication   Communication: No difficulties  Cognition Arousal/Alertness: Awake/alert Behavior During Therapy: WFL for tasks assessed/performed Overall Cognitive Status: Within Functional Limits for tasks assessed                                          General Comments      Exercises     Assessment/Plan    PT Assessment Patient needs continued PT services  PT Problem List Decreased strength;Decreased range of motion;Decreased activity tolerance;Decreased balance;Decreased mobility;Decreased safety awareness       PT Treatment Interventions DME instruction;Therapeutic exercise;Gait training;Functional mobility training;Therapeutic activities;Patient/family education;Balance training    PT Goals (Current goals can be found in the Care Plan section)  Acute Rehab PT Goals Patient Stated Goal: to return home, get outpatient PT PT Goal Formulation: With patient/family Time For Goal Achievement: 01/28/22 Potential to Achieve Goals: Good    Frequency Min 2X/week     Co-evaluation               AM-PAC PT "6 Clicks" Mobility  Outcome Measure Help needed turning from your back to your side while in a flat bed without using bedrails?: A Lot Help needed moving from lying on your back to sitting on the side of a flat bed without using bedrails?: A Little Help needed moving to and from a bed to a chair (including a wheelchair)?: A Little Help needed standing up from a chair using your arms (e.g., wheelchair or bedside chair)?: A Little Help needed to walk in hospital room?: A Little Help needed climbing 3-5 steps with a railing? : A Lot 6 Click Score: 16    End of Session Equipment Utilized During Treatment: Gait belt Activity Tolerance: Patient tolerated treatment well Patient left: in chair;with call bell/phone within reach;with nursing/sitter in room Nurse Communication:  Mobility status PT Visit Diagnosis: Muscle weakness (generalized) (M62.81);Difficulty in walking, not elsewhere classified (R26.2);Other abnormalities of gait and mobility (R26.89)    Time: 1194-1740 PT Time Calculation (min) (ACUTE ONLY): 17 min   Charges:   PT Evaluation $PT Eval Moderate Complexity: 1 Mod          Ballard Budney, PT, GCS 01/17/22,4:03 PM

## 2022-01-17 NOTE — Consult Note (Signed)
PHARMACY CONSULT NOTE - FOLLOW UP  Pharmacy Consult for Electrolyte Monitoring and Replacement   Recent Labs: Potassium (mmol/L)  Date Value  01/17/2022 4.2  10/11/2014 4.3   Magnesium (mg/dL)  Date Value  01/17/2022 2.1   Calcium (mg/dL)  Date Value  01/17/2022 7.9 (L)   Calcium, Total (mg/dL)  Date Value  10/11/2014 9.5   Albumin (g/dL)  Date Value  01/15/2022 2.8 (L)  03/30/2014 3.5   Phosphorus (mg/dL)  Date Value  01/17/2022 2.7   Sodium (mmol/L)  Date Value  01/17/2022 137  10/11/2014 138     Assessment: 88yo F w/ h/o HTN, DM, CKD, bronchiectasis, asthma, PUD, MGUS, seizures, and PE (on Eliquis PTA) now presenting to ED with epistaxis, coffee ground emesis, and melena with last dose of Eliquis 2.'5mg'$  6pm on 7/17. Pt was reversed with [facility] 7/18 ~0930. Pharmacy consulted for mgmt of electrolytes while in CCU.  Goal of Therapy:  [place] WNL  Plan:  Scr 1.13>1.01>0.91 (BL 0.86 01/2021); UOP 0.60m/k/h w/ net neutral fluid balance. Advanced NPO to CLD. K 4.5>4.2. WNL, no repletion at this time. Mg 1.5>2.1. After MgSO4 2g IV x1 dose on 7/19. No further repletion at this time. Phos 3.8 > 2.7. WNL, but downtrended. CTM and replace PRN. Next set of labs with 0500 labs.  BLorna Dibble,PharmD Clinical Pharmacist 01/17/2022 8:21 AM

## 2022-01-17 NOTE — Progress Notes (Signed)
0900 Offered a bath- patient refused. 1300 Offered a bath-patient refused.

## 2022-01-17 NOTE — Progress Notes (Signed)
[facility] Progress Note  Patient: Rebecca Wolf    VHQ:[id number]  DOA: 01/15/2022    Date of Service: the patient was seen and examined on 01/17/2022  Brief hospital course: 88 year old female with past medical history of PE on Eliquis, hypertension, systolic heart failure with ejection fraction 45%, chronic kidney disease and seizures who presents to the emergency room on 7/18 with complaints of epistaxis and melena.  CT angiogram noted acute intraluminal gastric bleeding.  Initial blood pressure stable, but then patient started becoming tachycardic with mentation changes and she was transfused emergent blood as well as 1 L of normal saline.  Vascular surgery emergently took patient for microembolization of left gastric and right gastric vessels.  Gastroenterology consulted.  Assessment and Plan: Assessment and Plan: * GI bleeding History of PE on Eliquis.  Eliquis on hold.  Status post 1 unit packed red blood cell transfusion.  Patient with microembolization of left and right gastric vessels with "embolization of left gastric.  GI following although no need for EGD given stabilization by vascular surgery.  Acute blood loss anemia Baseline hemoglobin appears to be around 11, although was down to 8.9 in March.  Presented with hemoglobin of 9.2 on admission however this was hemoconcentrated so likely lower.  With fluids and additional blood, increased to 12, but has steadily declined over the next 48 hours to 8.1.  That said, overall stable.  Still believed to be hemoconcentration given improvement in creatinine.  Recheck labs in the morning.  Chronic systolic CHF (congestive heart failure) (HCC) Initially dry.  Despite getting blood and IV fluids and diuretics on hold, BNP within normal limits.  Acute kidney injury in the setting of CKD (chronic kidney disease), stage II Secondary to blood loss anemia.  Status post blood transfusion and fluids.  Creatinine continues to improve.  Almost at  baseline.  Leukocytosis Mild improvement in previous day.  Checking procalcitonin.  Repeat labs in the morning.  Likely stress margination brought on by bleed.  Hypertension Holding home medications secondary to hypotension brought on by blood loss.  Seizure disorder ([place]) Stable, continue Keppra  Hypomagnesemia Replacing as needed  Pressure injury of skin Pressure Injury 01/15/22 Sacrum Medial Stage 2 -  Partial thickness loss of dermis presenting as a shallow open injury with a red, pink wound bed without slough. (Active)  01/15/22 1615  Location: Sacrum  Location Orientation: Medial  Staging: Stage 2 -  Partial thickness loss of dermis presenting as a shallow open injury with a red, pink wound bed without slough.  Wound Description (Comments):   Present on Admission: Yes    Stage II decubitus ulcer, present on admission.  Overweight (BMI 25.0-29.9) Meets criteria BMI greater than 25       Body mass index is 29.21 kg/m.    Pressure Injury 01/15/22 Sacrum Medial Stage 2 -  Partial thickness loss of dermis presenting as a shallow open injury with a red, pink wound bed without slough. (Active)  01/15/22 1615  Location: Sacrum  Location Orientation: Medial  Staging: Stage 2 -  Partial thickness loss of dermis presenting as a shallow open injury with a red, pink wound bed without slough.  Wound Description (Comments):   Present on Admission: Yes  Dressing Type Foam - Lift dressing to assess site every shift 01/17/22 0727     Consultants: Gastroenterology Critical care Vascular surgery  Procedures: Coil embolization of left and right gastric vessels  Antimicrobials: Preop Ancef  Code Status: Full code  Subjective: No complaints.  Wants to get out of bed.  Objective: Blood pressure is a little soft Vitals:   01/17/22 1200 01/17/22 1300  BP: (!) 95/51 (!) 120/58  Pulse: 79 80  Resp: 20 19  Temp: 98.2 F (36.8 C)   SpO2: 96% 97%    Intake/Output  Summary (Last 24 hours) at 01/17/2022 1428 Last data filed at 01/17/2022 1300 Gross per 24 hour  Intake 811.17 ml  Output 550 ml  Net 261.17 ml    Filed Weights   01/15/22 1615 01/16/22 0500 01/17/22 0500  Weight: 70.5 kg 70.8 kg 77.2 kg   Body mass index is 29.21 kg/m.  Exam:  General: Alert and oriented x2, no acute distress HEENT: Normocephalic and atraumatic, mucous membranes a little dry Cardiovascular: Regular rate and rhythm, S1-S2 Respiratory: Clear to auscultation bilaterally Abdomen: Soft, nontender, nondistended, positive bowel sounds Musculoskeletal: No clubbing or cyanosis, trace pitting edema Skin: No skin breaks, tears or lesions Psychiatry: Appropriate, no evidence of psychoses Neurology: No focal deficits  Data Reviewed: Hemoglobin down to 8.1.  Creatinine now normal with GFR 58.  Normal BNP and procalcitonin.  Disposition:  Status is: Inpatient Remains inpatient appropriate because: Ensure stabilization of hemoglobin, evaluation by physical therapy  Anticipated discharge date: 7/21  Family Communication: Daughter at the bedside DVT Prophylaxis:   SCDs    Author: Annita Brod ,MD 01/17/2022 2:28 PM  To reach On-call, see care teams to locate the attending and reach out via [email or web address]. Between 7PM-7AM, please contact night-coverage If you still have difficulty reaching the attending provider, please page the [facility] (Director on Call) for [facility] on amion for assistance.

## 2022-01-17 NOTE — Progress Notes (Addendum)
1545 Report called to [place] on [place]. 1615 Transferred to room 212 via recliner. Before transfer had small black stool.

## 2022-01-18 ENCOUNTER — Encounter: Payer: Self-pay | Admitting: Nurse Practitioner

## 2022-01-18 DIAGNOSIS — K254 Chronic or unspecified gastric ulcer with hemorrhage: Secondary | ICD-10-CM | POA: Diagnosis not present

## 2022-01-18 DIAGNOSIS — D62 Acute posthemorrhagic anemia: Secondary | ICD-10-CM | POA: Diagnosis not present

## 2022-01-18 DIAGNOSIS — I5022 Chronic systolic (congestive) heart failure: Secondary | ICD-10-CM | POA: Diagnosis not present

## 2022-01-18 DIAGNOSIS — N182 Chronic kidney disease, stage 2 (mild): Secondary | ICD-10-CM | POA: Diagnosis not present

## 2022-01-18 LAB — CBC
HCT: 21.7 % — ABNORMAL LOW (ref 36.0–46.0)
Hemoglobin: 6.8 g/dL — ABNORMAL LOW (ref 12.0–15.0)
MCH: 26.2 pg (ref 26.0–34.0)
MCHC: 31.3 g/dL (ref 30.0–36.0)
MCV: 83.5 fL (ref 80.0–100.0)
Platelets: 117 10*3/uL — ABNORMAL LOW (ref 150–400)
RBC: 2.6 MIL/uL — ABNORMAL LOW (ref 3.87–5.11)
RDW: 16.2 % — ABNORMAL HIGH (ref 11.5–15.5)
WBC: 10 10*3/uL (ref 4.0–10.5)
nRBC: 0.3 % — ABNORMAL HIGH (ref 0.0–0.2)

## 2022-01-18 LAB — BASIC METABOLIC PANEL
Anion gap: 3 — ABNORMAL LOW (ref 5–15)
BUN: 16 mg/dL (ref 8–23)
CO2: 24 mmol/L (ref 22–32)
Calcium: 7.8 mg/dL — ABNORMAL LOW (ref 8.9–10.3)
Chloride: 112 mmol/L — ABNORMAL HIGH (ref 98–111)
Creatinine, Ser: 0.88 mg/dL (ref 0.44–1.00)
GFR, Estimated: >60 mL/min (ref >60)
Glucose, Bld: 121 mg/dL — ABNORMAL HIGH (ref 70–99)
Potassium: 3.5 mmol/L (ref 3.5–5.1)
Sodium: 139 mmol/L (ref 135–145)

## 2022-01-18 LAB — PREPARE RBC (CROSSMATCH)

## 2022-01-18 LAB — MAGNESIUM: Magnesium: 1.8 mg/dL (ref 1.7–2.4)

## 2022-01-18 LAB — PHOSPHORUS: Phosphorus: 2.6 mg/dL (ref 2.5–4.6)

## 2022-01-18 MED ORDER — FUROSEMIDE 10 MG/ML IJ SOLN
20.0000 mg | Freq: Once | INTRAMUSCULAR | Status: AC
  Administered 2022-01-18: 20 mg via INTRAVENOUS
  Filled 2022-01-18: qty 4

## 2022-01-18 MED ORDER — SODIUM CHLORIDE 0.9% IV SOLUTION: Freq: Once | INTRAVENOUS | Status: AC

## 2022-01-18 MED ORDER — PANTOPRAZOLE SODIUM 40 MG PO TBEC
40.0000 mg | DELAYED_RELEASE_TABLET | Freq: Two times a day (BID) | ORAL | Status: DC
  Administered 2022-01-18 – 2022-01-20 (×4): 40 mg via ORAL
  Filled 2022-01-18 (×4): qty 1

## 2022-01-18 NOTE — Progress Notes (Signed)
Mobility Specialist - Progress Note   01/18/22 1000  Mobility  Activity Contraindicated/medical hold     Per chart review, pt HgB 6.8 contraindicating mobility. Will attempt another date/time as medically appropriate.    Kathee Delton Mobility Specialist 01/18/22, 10:00 AM

## 2022-01-18 NOTE — TOC Initial Note (Signed)
Transition of Care [facility]) - Initial/Assessment Note    Patient Details  Name: Rebecca Wolf MRN: [id number] Date of Birth: 19-Jun-1925  [facility] CM/SW Contact:    Alberteen Sam, LCSW Phone Number: 01/18/2022, 11:56 AM  Clinical Narrative:                  CSW met with patient and daughter [name] at bedside, report they are agreeable to get outpatient PT with [facility] outpatient PT. Report they use [facility] pharmacy off [address], daughter [name] to transport patient home at time of discharge. PCP is Kimel-Scott with [facility]. No other dc needs identified.   CSW has sent referral for outpatient PT to [facility].   Expected Discharge Plan: Home/Self Care Barriers to Discharge: Continued Medical Work up   Patient Goals and CMS Choice Patient states their goals for this hospitalization and ongoing recovery are:: to go home CMS Medicare.gov Compare Post Acute Care list provided to:: Patient Choice offered to / list presented to : Patient  Expected Discharge Plan and Services Expected Discharge Plan: Home/Self Care       Living arrangements for the past 2 months: Single Family Home                                      Prior Living Arrangements/Services Living arrangements for the past 2 months: Single Family Home Lives with:: Self                   Activities of Daily Living Home Assistive Devices/Equipment: Environmental consultant (specify type), Hearing aid ADL Screening (condition at time of admission) Patient's cognitive ability adequate to safely complete daily activities?: Yes Is the patient deaf or have difficulty hearing?: No Does the patient have difficulty seeing, even when wearing glasses/contacts?: No Does the patient have difficulty concentrating, remembering, or making decisions?: No Patient able to express need for assistance with ADLs?: Yes Does the patient have difficulty dressing or bathing?: No Independently performs ADLs?: Yes (appropriate for  developmental age) Does the patient have difficulty walking or climbing stairs?: Yes Weakness of Legs: None Weakness of Arms/Hands: None  Permission Sought/Granted                  Emotional Assessment       Orientation: : Oriented to Self, Oriented to Place, Oriented to  Time, Oriented to Situation Alcohol / Substance Use: Not Applicable Psych Involvement: No (comment)  Admission diagnosis:  GI bleeding [K92.2] GIB (gastrointestinal bleeding) [K92.2] Upper GI bleed [K92.2] Anticoagulated [Z79.01] Patient Active Problem List   Diagnosis Date Noted   Acute blood loss anemia 01/16/2022   Overweight (BMI 25.0-29.9) 01/16/2022   Acute kidney injury in the setting of CKD (chronic kidney disease), stage II 36/64/4034   Chronic systolic CHF (congestive heart failure) ([facility]) 01/16/2022   Hypomagnesemia    Leukocytosis    GI bleeding 01/15/2022   GIB (gastrointestinal bleeding) 01/15/2022   Pressure injury of skin 01/15/2022   B12 deficiency 10/17/2019   Folate deficiency 10/17/2019   Anemia 08/29/2019   Iron deficiency anemia 08/26/2019   Chronic anticoagulation 10/25/2018   CKD stage G3b/A1, GFR 30-44 and albumin creatinine ratio <30 mg/g ([place]) 10/05/2018   MGUS (monoclonal gammopathy of unknown significance) 07/23/2018   Osteopenia after menopause 07/16/2018   Diabetes mellitus with no complication ([place]) 74/25/9563   Vitamin D deficiency 04/07/2018   Heart palpitations 01/06/2018  Tachycardia 01/06/2018   Cystocele with rectocele 10/30/2017   Rectocele 10/30/2017   Microcytic red blood cells 09/23/2017   Localized edema 02/13/2017   Hemorrhoids 05/17/2016   Obesity (BMI 30-39.9) 04/26/2016   Multiple lung nodules 04/19/2016   Chest pain 04/17/2016   Partial symptomatic epilepsy with complex partial seizures, not intractable, without status epilepticus ([place]) 06/30/2015   Bronchiectasis without complication ([place]) 06/15/2445   Imbalance 11/08/2014   Hypertension  11/08/2014   Personal history of malignant neoplasm of breast 11/08/2014   History of malignant carcinoid tumor of bronchus and lung 11/08/2014   H/O peptic ulcer 11/08/2014   Asthma, mild intermittent 11/08/2014   Pulmonary embolism ([place]) 11/08/2014   Gonalgia 11/08/2014   Seizure disorder ([place]) 11/08/2014   Leg varices 11/08/2014   PCP:  Erline Levine, MD Pharmacy:   [id number] [address], [address] AT [address] [place] [address] Phone: [phone] Fax: [place], [place] [address] [place] [address] Phone: [phone] Fax: [phone]  [facility] [address], [address] [place] [address] Phone: [phone] Fax: [phone]     Social Determinants of Health (SDOH) Interventions    Readmission Risk Interventions     No data to display

## 2022-01-18 NOTE — Care Management Important Message (Signed)
Important Message  Patient Details  Name: Rebecca Wolf MRN: [id number] Date of Birth: 1924/12/12   Medicare Important Message Given:  Yes     Dannette Barbara 01/18/2022, 10:53 AM

## 2022-01-18 NOTE — Progress Notes (Signed)
[facility] Progress Note  Patient: Rebecca Wolf    OVZ:[id number]  DOA: 01/15/2022    Date of Service: the patient was seen and examined on 01/18/2022  Brief hospital course: 88 year old female with past medical history of PE on Eliquis, hypertension, systolic heart failure with ejection fraction 45%, chronic kidney disease and seizures who presents to the emergency room on 7/18 with complaints of epistaxis and melena.  CT angiogram noted acute intraluminal gastric bleeding.  Initial blood pressure stable, but then patient started becoming tachycardic with mentation changes and she was transfused emergent blood as well as 1 L of normal saline.  Vascular surgery emergently took patient on 7/18 for microembolization of left gastric and right gastric vessels.  Gastroenterology consulted.  Since then, patient has been stable.  Vital signs stable no further episodes of either hematochezia or hematemesis.  Hemoglobin has been trending downward since admission.  Some of this was attributed to hemoconcentration as blood pressure and heart rate have been stable.  On 7/21, hemoglobin down to 6.8.  Patient transfused 2 units packed red blood cells.  Assessment and Plan: Assessment and Plan: * GI bleeding History of PE on Eliquis.  Eliquis on hold.  Status post 1 unit packed red blood cell transfusion.  Patient with microembolization of left and right gastric vessels with "embolization of left gastric.  GI following although no need for EGD given stabilization by vascular surgery.  Acute blood loss anemia Baseline hemoglobin appears to be around 11, although was down to 8.9 in March.  Presented with hemoglobin of 9.2 on admission however this was hemoconcentrated so likely lower.  With fluids and additional blood, increased to 12, but has steadily declined and by 7/21 down to 6.9.  Transfused 2 units packed red blood cells.  Gastroenterology following for consideration of EGD.  Patient daughter unsure if  she wants her mother to go through that.  Advised that we could see what her hemoglobin presented posttransfusion on 7/22 and if had dropped further on 7/23, proceed with EEG on 7/24 if they are amenable.  Chronic systolic CHF (congestive heart failure) (HCC) Initially dry.  Despite getting blood and IV fluids and diuretics on hold, BNP within normal limits.  Acute kidney injury in the setting of CKD (chronic kidney disease), stage II-resolved as of 01/18/2022 Secondary to blood loss anemia.  Status post blood transfusion and fluids.  Normalized by 7/21  Leukocytosis Stress margination brought on by bleed.  Procalcitonin normal.  Continues to improve.  Hypertension Holding home medications secondary to hypotension brought on by blood loss.  Seizure disorder ([place]) Stable, continue Keppra  Hypomagnesemia Replacing as needed  Pressure injury of skin Pressure Injury 01/15/22 Sacrum Medial Stage 2 -  Partial thickness loss of dermis presenting as a shallow open injury with a red, pink wound bed without slough. (Active)  01/15/22 1615  Location: Sacrum  Location Orientation: Medial  Staging: Stage 2 -  Partial thickness loss of dermis presenting as a shallow open injury with a red, pink wound bed without slough.  Wound Description (Comments):   Present on Admission: Yes    Stage II decubitus ulcer, present on admission.  Overweight (BMI 25.0-29.9) Meets criteria BMI greater than 25       Body mass index is 29.21 kg/m.    Pressure Injury 01/15/22 Sacrum Medial Stage 2 -  Partial thickness loss of dermis presenting as a shallow open injury with a red, pink wound bed without slough. (Active)  01/15/22 1615  Location: Sacrum  Location Orientation: Medial  Staging: Stage 2 -  Partial thickness loss of dermis presenting as a shallow open injury with a red, pink wound bed without slough.  Wound Description (Comments):   Present on Admission: Yes  Dressing Type Foam - Lift dressing  to assess site every shift 01/17/22 2212     Consultants: Gastroenterology Critical care Vascular surgery  Procedures: Coil embolization of left and right gastric vessels Transfusion 2 units packed red blood cells 7/21  Antimicrobials: Preop Ancef  Code Status: Full code   Subjective: No complaints, tired  Objective: Blood pressure is a little soft Vitals:   01/18/22 1152 01/18/22 1436  BP: (!) 124/56 (!) 123/52  Pulse: 84 91  Resp:  18  Temp: 98.2 F (36.8 C) 98.6 F (37 C)  SpO2: 100% 98%    Intake/Output Summary (Last 24 hours) at 01/18/2022 1455 Last data filed at 01/18/2022 1430 Gross per 24 hour  Intake 406 ml  Output 451 ml  Net -45 ml    Filed Weights   01/15/22 1615 01/16/22 0500 01/17/22 0500  Weight: 70.5 kg 70.8 kg 77.2 kg   Body mass index is 29.21 kg/m.  Exam:  General: Alert and oriented x2, no acute distress HEENT: Normocephalic and atraumatic, mucous membranes a little dry Cardiovascular: Regular rate and rhythm, S1-S2 Respiratory: Clear to auscultation bilaterally Abdomen: Soft, nontender, nondistended, positive bowel sounds Musculoskeletal: No clubbing or cyanosis, trace pitting edema Skin: No skin breaks, tears or lesions Psychiatry: Appropriate, no evidence of psychoses Neurology: No focal deficits  Data Reviewed: Creatinine normalized.  Hemoglobin down to 6.9.  Disposition:  Status is: Inpatient Remains inpatient appropriate because: Ensure stabilization of hemoglobin Anticipated discharge date: 7/24  Family Communication: Daughter at the bedside DVT Prophylaxis:   SCDs    Author: Annita Brod ,MD 01/18/2022 2:55 PM  To reach On-call, see care teams to locate the attending and reach out via [email or web address]. Between 7PM-7AM, please contact night-coverage If you still have difficulty reaching the attending provider, please page the [facility] (Director on Call) for [facility] on amion for assistance.

## 2022-01-18 NOTE — Progress Notes (Signed)
Jonathon Bellows , MD [address], [place], [place], [place], [address] [address], [place], [place], [place], [address] Phone: [phone]  Fax: [phone]   Rebecca Wolf is being followed for GI bleed  Subjective: Pt denies any hematemesis, very small dark bowel movement earlier but was smaller in size than what she has had over the past few days. No abdominal pain    Objective: Vital signs in last 24 hours: Vitals:   01/17/22 2039 01/18/22 0326 01/18/22 0745 01/18/22 0752  BP:  124/63 119/63   Pulse:  81 82   Resp:  20 18   Temp:  98.5 F (36.9 C) 98.9 F (37.2 C)   TempSrc:      SpO2: 97% 98% 97% 97%  Weight:      Height:       Weight change:   Intake/Output Summary (Last 24 hours) at 01/18/2022 1124 Last data filed at 01/17/2022 2030 Gross per 24 hour  Intake 180 ml  Output 451 ml  Net -271 ml     Exam: Heart:: Regular rate and rhythm, S1S2 present, or without murmur or extra heart sounds Lungs: normal and clear to auscultation Abdomen: soft, nontender, normal bowel sounds   Lab Results: '@LABTEST2'$ @ Micro Results: Recent Results (from the past 240 hour(s))  MRSA Next Gen by PCR, Nasal     Status: None   Collection Time: 01/15/22  4:18 PM   Specimen: Nasal Mucosa; Nasal Swab  Result Value Ref Range Status   MRSA by PCR Next Gen NOT DETECTED NOT DETECTED Final    Comment: (NOTE) The GeneXpert MRSA Assay (FDA approved for NASAL specimens only), is one component of a comprehensive MRSA colonization surveillance program. It is not intended to diagnose MRSA infection nor to guide or monitor treatment for MRSA infections. Test performance is not FDA approved in patients less than 14 years old. Performed at [facility], [address], [place], [place] [address]    Studies/Results: No results found. Medications: I have reviewed the patient's current medications. Scheduled Meds:  sodium chloride   Intravenous Once   sodium chloride    Intravenous Once   budesonide  0.5 mg Inhalation BID   Chlorhexidine Gluconate Cloth  6 each Topical Q0600   furosemide  20 mg Intravenous Once   furosemide  20 mg Intravenous Once   montelukast  10 mg Oral Daily   pantoprazole  40 mg Oral BID   sodium chloride flush  3 mL Intravenous Q12H   Continuous Infusions:  sodium chloride     sodium chloride     levETIRAcetam 500 mg (01/18/22 1109)   pantoprazole Stopped (01/18/22 1104)   PRN Meds:.sodium chloride, sodium chloride, acetaminophen, morphine injection, ondansetron (ZOFRAN) IV, ondansetron (ZOFRAN) IV, mouth rinse, oxyCODONE, sodium chloride flush, sodium chloride flush   Assessment: Principal Problem:   GI bleeding Active Problems:   Hypertension   Seizure disorder ([place])   Pressure injury of skin   Acute blood loss anemia   Overweight (BMI 25.0-29.9)   Acute kidney injury in the setting of CKD (chronic kidney disease), stage II   Chronic systolic CHF (congestive heart failure) ([place])   Hypomagnesemia   Leukocytosis Arther Abbott. Age female admitted with hematemesis and melena.  Could not perform an EGD as she was having significant bleeding had to undergo a CT angiogram which showed a positive active bleeding in the stomach and underwent a subsequent embolization Overnight drop in Hb > 2 grams . Clinically  she has not exhibited any signs of a bleed -    Plan: IV PPI : unfortunately cannot do EGD today as she has had breakfast at 9 am . Will plan for tomorrow with Dr Alice Reichert  It is possible that the lab result was an error but we will proceed as planned and if the next lab result indicates error after transfusion , then procedure can be cancelled.    I have discussed alternative options, risks & benefits,  which include, but are not limited to, bleeding, infection, perforation,respiratory complication & drug reaction.  The patient agrees with this plan & written consent will be obtained.       LOS: 3 days   Jonathon Bellows,  MD 01/18/2022, 11:24 AM

## 2022-01-18 NOTE — Progress Notes (Signed)
     [facility] REFERRAL         * Physical Therapy *                            DATE 01/18/22  PATIENT NAME Rebecca Wolf PATIENT MRN [id number]       DIAGNOSIS/DIAGNOSIS CODE K92.2  DATE OF DISCHARGE: 01/19/22       PRIMARY CARE PHYSICIAN  :  Dr. Loraine Maple  PCP PHONE/FAX : [phone]     Dear Provider (Name: [facility] Fax: [phone]   I certify that I have examined this patient and that occupational/physical/speech therapy is necessary on an outpatient basis.    The patient has expressed interest in completing their recommended course of therapy at your  location.  Once a formal order from the patient's primary care physician has been obtained, please  contact him/her to schedule an appointment for evaluation at your earliest convenience.   [ X]  Physical Therapy Evaluate and Treat  [  ]  Occupational Therapy Evaluate and Treat  [  ]  Speech Therapy Evaluate and Treat         The patient's primary care physician (listed above) must furnish and be responsible for a formal order such that the recommended services may be furnished while under the primary physician's care, and that the plan of care will be established and reviewed every 30 days (or more often if condition necessitates).  [place], [place]

## 2022-01-18 NOTE — Plan of Care (Signed)

## 2022-01-19 ENCOUNTER — Inpatient Hospital Stay: Payer: Medicare Other

## 2022-01-19 ENCOUNTER — Encounter
Admission: EM | Disposition: A | Discharge status: Home Health | Payer: Self-pay | Source: Home / Self Care | Attending: Internal Medicine

## 2022-01-19 DIAGNOSIS — N182 Chronic kidney disease, stage 2 (mild): Secondary | ICD-10-CM | POA: Diagnosis not present

## 2022-01-19 DIAGNOSIS — K254 Chronic or unspecified gastric ulcer with hemorrhage: Secondary | ICD-10-CM | POA: Diagnosis not present

## 2022-01-19 DIAGNOSIS — I5022 Chronic systolic (congestive) heart failure: Secondary | ICD-10-CM | POA: Diagnosis not present

## 2022-01-19 DIAGNOSIS — D62 Acute posthemorrhagic anemia: Secondary | ICD-10-CM | POA: Diagnosis not present

## 2022-01-19 LAB — BASIC METABOLIC PANEL
Anion gap: 8 (ref 5–15)
BUN: 14 mg/dL (ref 8–23)
CO2: 27 mmol/L (ref 22–32)
Calcium: 8 mg/dL — ABNORMAL LOW (ref 8.9–10.3)
Chloride: 104 mmol/L (ref 98–111)
Creatinine, Ser: 0.86 mg/dL (ref 0.44–1.00)
GFR, Estimated: >60 mL/min (ref >60)
Glucose, Bld: 103 mg/dL — ABNORMAL HIGH (ref 70–99)
Potassium: 3.2 mmol/L — ABNORMAL LOW (ref 3.5–5.1)
Sodium: 139 mmol/L (ref 135–145)

## 2022-01-19 LAB — CBC WITH DIFFERENTIAL/PLATELET
Abs Immature Granulocytes: 0.07 10*3/uL (ref 0.00–0.07)
Basophils Absolute: 0 10*3/uL (ref 0.0–0.1)
Basophils Relative: 0 %
Eosinophils Absolute: 0.2 10*3/uL (ref 0.0–0.5)
Eosinophils Relative: 2 %
HCT: 30.9 % — ABNORMAL LOW (ref 36.0–46.0)
Hemoglobin: 10.2 g/dL — ABNORMAL LOW (ref 12.0–15.0)
Immature Granulocytes: 1 %
Lymphocytes Relative: 20 %
Lymphs Abs: 2.4 10*3/uL (ref 0.7–4.0)
MCH: 27.2 pg (ref 26.0–34.0)
MCHC: 33 g/dL (ref 30.0–36.0)
MCV: 82.4 fL (ref 80.0–100.0)
Monocytes Absolute: 1.2 10*3/uL — ABNORMAL HIGH (ref 0.1–1.0)
Monocytes Relative: 10 %
Neutro Abs: 7.9 10*3/uL — ABNORMAL HIGH (ref 1.7–7.7)
Neutrophils Relative %: 67 %
Platelets: 143 10*3/uL — ABNORMAL LOW (ref 150–400)
RBC: 3.75 MIL/uL — ABNORMAL LOW (ref 3.87–5.11)
RDW: 15.9 % — ABNORMAL HIGH (ref 11.5–15.5)
WBC: 11.7 10*3/uL — ABNORMAL HIGH (ref 4.0–10.5)
nRBC: 0 % (ref 0.0–0.2)

## 2022-01-19 LAB — TYPE AND SCREEN
ABO/RH(D): A POS
Antibody Screen: NEGATIVE
Unit division: 0
Unit division: 0
Unit division: 0
Unit division: 0

## 2022-01-19 LAB — CBC
HCT: 31 % — ABNORMAL LOW (ref 36.0–46.0)
Hemoglobin: 10.3 g/dL — ABNORMAL LOW (ref 12.0–15.0)
MCH: 27.2 pg (ref 26.0–34.0)
MCHC: 33.2 g/dL (ref 30.0–36.0)
MCV: 81.8 fL (ref 80.0–100.0)
Platelets: 134 10*3/uL — ABNORMAL LOW (ref 150–400)
RBC: 3.79 MIL/uL — ABNORMAL LOW (ref 3.87–5.11)
RDW: 15.9 % — ABNORMAL HIGH (ref 11.5–15.5)
WBC: 10.9 10*3/uL — ABNORMAL HIGH (ref 4.0–10.5)
nRBC: 0.2 % (ref 0.0–0.2)

## 2022-01-19 LAB — BPAM RBC
Blood Product Expiration Date: 202308142359
Blood Product Expiration Date: 202308142359
Blood Product Expiration Date: 202308192359
Blood Product Expiration Date: 202308192359
ISSUE DATE / TIME: 202307180807
ISSUE DATE / TIME: 202307180807
ISSUE DATE / TIME: 202307211127
ISSUE DATE / TIME: 202307211429
Unit Type and Rh: 5100
Unit Type and Rh: 5100
Unit Type and Rh: 6200
Unit Type and Rh: 6200

## 2022-01-19 LAB — MAGNESIUM
Magnesium: 1.5 mg/dL — ABNORMAL LOW (ref 1.7–2.4)
Magnesium: 1.4 mg/dL — ABNORMAL LOW (ref 1.7–2.4)

## 2022-01-19 LAB — PHOSPHORUS: Phosphorus: 3.1 mg/dL (ref 2.5–4.6)

## 2022-01-19 SURGERY — ESOPHAGOGASTRODUODENOSCOPY (EGD) WITH PROPOFOL
Anesthesia: General

## 2022-01-19 MED ORDER — POTASSIUM CHLORIDE CRYS ER 20 MEQ PO TBCR
40.0000 meq | EXTENDED_RELEASE_TABLET | Freq: Once | ORAL | Status: AC
  Administered 2022-01-19: 40 meq via ORAL
  Filled 2022-01-19: qty 2

## 2022-01-19 NOTE — Progress Notes (Signed)
Physical Therapy Treatment Patient Details Name: Rebecca Wolf MRN: [id number] DOB: 1924-07-23 Today's Date: 01/19/2022   History of Present Illness Rebecca Wolf is a 88 year old female with a past medical history significant for PE on Eliquis, hypertension, CHF with mildly reduced systolic dysfunction (EF 03%), peptic ulcer disease, seizures, CKD, bronchiectasis, asthma who presents to [facility] ED on 01/15/2022 due to complaints of epistaxis and melena.  History is obtained from patient's daughter at bedside.    PT Comments    Pt was long sitting in bed with supportive daughter and son present. She is alert however stayed mostly non verbal during session,. She is able to talk but chooses not too. Per family, pt is at baseline cognition however is much weaker than baseline. Pt/Family are currently refusing SNF/rehab  placement. They would like for pt to return home once cleared medically. Pt was able to exit bed, stand, and ambulate with RW however requires a lot of assistance throughout. Pt will require EMS transport home + HHPT at DC since she will not be able to safely go up/down stairs until pt is much stronger.     Recommendations for follow up therapy are one component of a multi-disciplinary discharge planning process, led by the attending physician.  Recommendations may be updated based on patient status, additional functional criteria and insurance authorization.  Follow Up Recommendations  Home health PT (pt/family do not rehab at DC. They are agreeable to HHPT and OP PT however pt is currently unable to perform stairs to enter/exit home. Recommend EMS transport home + HHPT)     Assistance Recommended at Discharge Frequent or constant Supervision/Assistance  Patient can return home with the following A little help with bathing/dressing/bathroom;Help with stairs or ramp for entrance;Assist for transportation;Assistance with cooking/housework;A lot of help with walking and/or transfers    Equipment Recommendations  Rolling walker (2 wheels);Hospital bed       Precautions / Restrictions Precautions Precautions: Fall Restrictions Weight Bearing Restrictions: No     Mobility  Bed Mobility Overal bed mobility: Needs Assistance Bed Mobility: Supine to Sit, Sit to Supine  Supine to sit: Min assist, Mod assist Sit to supine: Mod assist  General bed mobility comments: pt require much more asisstance to exit bed today versus on eval several days prior    Transfers Overall transfer level: Needs assistance Equipment used: Rolling walker (2 wheels) Transfers: Sit to/from Stand Sit to Stand: Min assist, Mod assist  General transfer comment: min assist from elevated surfaces but mod assist form lower/standard heights    Ambulation/Gait Ambulation/Gait assistance: Min guard, Min assist Gait Distance (Feet): 20 Feet Assistive device: Rolling walker (2 wheels) Gait Pattern/deviations: Step-through pattern, Decreased step length - right, Decreased step length - left, Trunk flexed Gait velocity: decr     General Gait Details: pt was able to ambulate short distance/ Walked to doorway of room but unable to go further. Pt is not safe to attempt stairs due to weakness. Stairs to enter home currently. Chair follow for safety.    Balance Overall balance assessment: Needs assistance Sitting-balance support: Feet supported Sitting balance-Leahy Scale: Good     Standing balance support: Bilateral upper extremity supported, During functional activity, Reliant on assistive device for balance Standing balance-Leahy Scale: Poor Standing balance comment: pt is high fall risk         Cognition Arousal/Alertness: Awake/alert Behavior During Therapy: Flat affect Overall Cognitive Status: Within Functional Limits for tasks assessed (per family, at baseline)    General Comments: Pt  was alert and able to consistently follow simple commands. increased time to perform all task            General Comments General comments (skin integrity, edema, etc.): Pt sat in recliner however unable to tolerate due to sacral wound pain (small bed sore). Lengthy discussion with family about concerns with plan to DC directly home. Pt will need EMS due to inability to safely perform stairs currently. Daughter to try to have ramp built for pt's power chair/scooter. will need HHPT not OP PT since pt will be unable to get out of home safely until stronger.      Pertinent Vitals/Pain Pain Assessment Pain Assessment: Faces Faces Pain Scale: Hurts little more Pain Location: sacral region due to small wound Pain Intervention(s): Limited activity within patient's tolerance, Monitored during session, Premedicated before session, Repositioned     PT Goals (current goals can now be found in the care plan section) Acute Rehab PT Goals Patient Stated Goal: pt did not state however daughter and son present state plan is to return home at DC Progress towards PT goals: Progressing toward goals    Frequency    Min 2X/week      PT Plan Discharge plan needs to be updated       AM-PAC PT "6 Clicks" Mobility   Outcome Measure  Help needed turning from your back to your side while in a flat bed without using bedrails?: A Little Help needed moving from lying on your back to sitting on the side of a flat bed without using bedrails?: A Lot Help needed moving to and from a bed to a chair (including a wheelchair)?: A Lot Help needed standing up from a chair using your arms (e.g., wheelchair or bedside chair)?: A Lot Help needed to walk in hospital room?: A Little Help needed climbing 3-5 steps with a railing? : Total 6 Click Score: 13    End of Session   Activity Tolerance: Patient tolerated treatment well;Patient limited by fatigue Patient left: in bed;with call bell/phone within reach;with bed alarm set;with family/visitor present;with nursing/sitter in room Nurse Communication: Mobility  status PT Visit Diagnosis: Muscle weakness (generalized) (M62.81);Difficulty in walking, not elsewhere classified (R26.2);Other abnormalities of gait and mobility (R26.89)     Time: 0998-3382 PT Time Calculation (min) (ACUTE ONLY): 44 min  Charges:  $Gait Training: 23-37 mins $Therapeutic Activity: 8-22 mins                     Julaine Fusi PTA 01/19/22, 2:11 PM

## 2022-01-19 NOTE — Progress Notes (Signed)
Rebecca Wolf with [facility] can accept patient for home health PT. Provider notified to put in [place] orders in.

## 2022-01-19 NOTE — Progress Notes (Addendum)
[employer] [facility]  Nurse reports temp 101 F No identified infection source BP stable, likely atelectasis  Action  Chest xray confirms l,ow lung volumes - incentive spirometer ordered. Mobilize  Response Will need follow up - high pneumonia risk

## 2022-01-19 NOTE — Progress Notes (Signed)
[facility] Progress Note  Patient: Rebecca Wolf    YIR:[id number]  DOA: 01/15/2022    Date of Service: the patient was seen and examined on 01/19/2022  Brief hospital course: 88 year old female with past medical history of PE on Eliquis, hypertension, systolic heart failure with ejection fraction 45%, chronic kidney disease and seizures who presents to the emergency room on 7/18 with complaints of epistaxis and melena.  CT angiogram noted acute intraluminal gastric bleeding.  Initial blood pressure stable, but then patient started becoming tachycardic with mentation changes and she was transfused emergent blood as well as 1 L of normal saline.  Vascular surgery emergently took patient on 7/18 for microembolization of left gastric and right gastric vessels.  Gastroenterology consulted.  Since then, patient has been stable.  Vital signs stable no further episodes of either hematochezia or hematemesis.  Hemoglobin has been trending downward since admission.  Some of this was attributed to hemoconcentration as blood pressure and heart rate have been stable.  On 7/21, hemoglobin down to 6.8 I & D patient was transfused 2 units packed red blood cells.  Assessment and Plan: Assessment and Plan: * GI bleeding History of PE on Eliquis.  Eliquis on hold.  Status post 1 unit packed red blood cell transfusion.  Patient with microembolization of left and right gastric vessels with "embolization of left gastric.  GI following although no need for EGD given stabilization by vascular surgery.  Acute blood loss anemia Baseline hemoglobin appears to be around 11, although was down to 8.9 in March.  Presented with hemoglobin of 9.2 on admission however this was hemoconcentrated so likely lower.  With fluids and additional blood, increased to 12, but has steadily declined and by 7/21 down to 6.9.  Transfused 2 units packed red blood cells.  Hemoglobin up to 10.3.  Expect some drop, but should be hopefully minor.   If significant drop on 7/23, family to consider EGD for 7/24.  If drop is minimal, anticipate discharge home.  Chronic systolic CHF (congestive heart failure) (HCC) Initially dry.  Despite getting blood and IV fluids and diuretics on hold, BNP within normal limits.  Recheck on 7/20 3 AM.  Acute kidney injury in the setting of CKD (chronic kidney disease), stage II-resolved as of 01/18/2022 Secondary to blood loss anemia.  Status post blood transfusion and fluids.  Normalized by 7/21  Leukocytosis Stress margination brought on by bleed.  Procalcitonin normal.  Continues to improve.  Hypertension Holding home medications secondary to hypotension brought on by blood loss.  Seizure disorder ([place]) Stable, continue Keppra  Hypomagnesemia Replacing as needed  Pressure injury of skin Pressure Injury 01/15/22 Sacrum Medial Stage 2 -  Partial thickness loss of dermis presenting as a shallow open injury with a red, pink wound bed without slough. (Active)  01/15/22 1615  Location: Sacrum  Location Orientation: Medial  Staging: Stage 2 -  Partial thickness loss of dermis presenting as a shallow open injury with a red, pink wound bed without slough.  Wound Description (Comments):   Present on Admission: Yes    Stage II decubitus ulcer, present on admission.  Overweight (BMI 25.0-29.9) Meets criteria BMI greater than 25       Body mass index is 28.8 kg/m.    Pressure Injury 01/15/22 Sacrum Medial Stage 2 -  Partial thickness loss of dermis presenting as a shallow open injury with a red, pink wound bed without slough. (Active)  01/15/22 1615  Location: Sacrum  Location Orientation: Medial  Staging: Stage 2 -  Partial thickness loss of dermis presenting as a shallow open injury with a red, pink wound bed without slough.  Wound Description (Comments):   Present on Admission: Yes  Dressing Type Foam - Lift dressing to assess site every shift 01/18/22 2120      Consultants: Gastroenterology Critical care Vascular surgery  Procedures: Coil embolization of left and right gastric vessels Transfusion 2 units packed red blood cells 7/21  Antimicrobials: Preop Ancef  Code Status: Full code   Subjective: Doing okay, a little tired.  Objective: Blood pressure has trended upward Vitals:   01/19/22 0733 01/19/22 0751  BP:  (!) 141/63  Pulse:  83  Resp:  18  Temp:  98.7 F (37.1 C)  SpO2: 97% 95%    Intake/Output Summary (Last 24 hours) at 01/19/2022 1255 Last data filed at 01/18/2022 1906 Gross per 24 hour  Intake 866 ml  Output 1675 ml  Net -809 ml    Filed Weights   01/16/22 0500 01/17/22 0500 01/19/22 0500  Weight: 70.8 kg 77.2 kg 76.1 kg   Body mass index is 28.8 kg/m.  Exam:  General: Alert and oriented x2, no acute distress HEENT: Normocephalic and atraumatic, mucous membranes a little dry Cardiovascular: Regular rate and rhythm, S1-S2 Respiratory: Clear to auscultation bilaterally Abdomen: Soft, nontender, nondistended, positive bowel sounds Musculoskeletal: No clubbing or cyanosis, trace pitting edema Skin: No skin breaks, tears or lesions Psychiatry: Appropriate, no evidence of psychoses Neurology: No focal deficits  Data Reviewed: Hemoglobin up to 10.3  Disposition:  Status is: Inpatient Remains inpatient appropriate because: Ensure stabilization of hemoglobin Anticipated discharge date: 7/23  Family Communication: Daughter at the bedside DVT Prophylaxis:   SCDs    Author: Annita Brod ,MD 01/19/2022 12:55 PM  To reach On-call, see care teams to locate the attending and reach out via [email or web address]. Between 7PM-7AM, please contact night-coverage If you still have difficulty reaching the attending provider, please page the [facility] (Director on Call) for [facility] on amion for assistance.

## 2022-01-19 NOTE — Progress Notes (Addendum)
GI Inpatient Follow-up Note  Subjective:  Patient seen in follow-up for Dr. Vicente Males this weekend. No acute events overnight. No new complaints. She is tolerating breakfast of solid foods. No overt hematochezia or melena. Hemoglobin 10.3 this morning.   Scheduled Inpatient Medications:   sodium chloride   Intravenous Once   budesonide  0.5 mg Inhalation BID   Chlorhexidine Gluconate Cloth  6 each Topical Q0600   montelukast  10 mg Oral Daily   pantoprazole  40 mg Oral BID   sodium chloride flush  3 mL Intravenous Q12H    Continuous Inpatient Infusions:    sodium chloride     sodium chloride     levETIRAcetam 500 mg (01/19/22 0859)    PRN Inpatient Medications:  sodium chloride, sodium chloride, acetaminophen, morphine injection, ondansetron (ZOFRAN) IV, ondansetron (ZOFRAN) IV, mouth rinse, oxyCODONE, sodium chloride flush, sodium chloride flush  Review of Systems: Constitutional: Weight is stable.  Eyes: No changes in vision. ENT: No oral lesions, sore throat.  GI: see HPI.  Heme/Lymph: No easy bruising.  CV: No chest pain.  GU: No hematuria.  Integumentary: No rashes.  Neuro: No headaches.  Psych: No depression/anxiety.  Endocrine: No heat/cold intolerance.  Allergic/Immunologic: No urticaria.  Resp: No cough, SOB.  Musculoskeletal: No joint swelling.    Physical Examination: BP (!) 141/63 (BP Location: Left Arm)   Pulse 83   Temp 98.7 F (37.1 C) (Oral)   Resp 18   Ht '5\' 4"'$  (1.626 m)   Wt 76.1 kg   SpO2 95%   BMI 28.80 kg/m  Gen: NAD, alert and oriented x 4, son present in room  HEENT: PEERLA, EOMI, Neck: supple, no JVD or thyromegaly Chest: CTA bilaterally, no wheezes, crackles, or other adventitious sounds CV: RRR, no m/g/c/r Abd: soft, NT, ND, +BS in all four quadrants; no HSM, guarding, ridigity, or rebound tenderness Ext: no edema, well perfused with 2+ pulses, Skin: no rash or lesions noted Lymph: no LAD  Data: Lab Results  Component Value Date    WBC 10.9 (H) 01/19/2022   HGB 10.3 (L) 01/19/2022   HCT 31.0 (L) 01/19/2022   MCV 81.8 01/19/2022   PLT 134 (L) 01/19/2022   Recent Labs  Lab 01/17/22 0654 01/18/22 0438 01/19/22 0615  HGB 8.1* 6.8* 10.3*   Lab Results  Component Value Date   NA 139 01/19/2022   K 3.2 (L) 01/19/2022   CL 104 01/19/2022   CO2 27 01/19/2022   BUN 14 01/19/2022   CREATININE 0.86 01/19/2022   Lab Results  Component Value Date   ALT 11 01/15/2022   AST 19 01/15/2022   ALKPHOS 83 01/15/2022   BILITOT <0.1 (L) 01/15/2022   Recent Labs  Lab 01/16/22 0726  APTT 31  INR 1.2   Assessment/Plan:  88 y/o AA female with a PMH of Hx of pulmonary embolism on Eliquis, HTN, systolic heart failure, CKD, Hx of seizures admitted 7/18 with epistaxis and melena. She is s/p embolization for active bleed in stomach. GI following.   Hematemesis/Melena - c/w active UGIB s/p embolization  - H&H stable currently with no overt gastrointestinal bleeding - No plans for repeat EGD at this time based on family wishes and hemodynamic stability  - Continue to monitor clinically - Continue IV PPI for gastric protection - Advance diet as tolerated - Continue to avoid NSAIDs and hold Eliquis as deemed medically feasible  - Continue management of medical comorbidities per primary team  - GI following along  Please call with questions or concerns.   Octavia Bruckner, PA-C [facility] [phone]

## 2022-01-20 LAB — CBC
HCT: 31.8 % — ABNORMAL LOW (ref 36.0–46.0)
Hemoglobin: 10.3 g/dL — ABNORMAL LOW (ref 12.0–15.0)
MCH: 27 pg (ref 26.0–34.0)
MCHC: 32.4 g/dL (ref 30.0–36.0)
MCV: 83.5 fL (ref 80.0–100.0)
Platelets: 145 10*3/uL — ABNORMAL LOW (ref 150–400)
RBC: 3.81 MIL/uL — ABNORMAL LOW (ref 3.87–5.11)
RDW: 16.2 % — ABNORMAL HIGH (ref 11.5–15.5)
WBC: 10.6 10*3/uL — ABNORMAL HIGH (ref 4.0–10.5)
nRBC: 0 % (ref 0.0–0.2)

## 2022-01-20 LAB — BASIC METABOLIC PANEL
Anion gap: 10 (ref 5–15)
BUN: 12 mg/dL (ref 8–23)
CO2: 25 mmol/L (ref 22–32)
Calcium: 8.2 mg/dL — ABNORMAL LOW (ref 8.9–10.3)
Chloride: 103 mmol/L (ref 98–111)
Creatinine, Ser: 0.85 mg/dL (ref 0.44–1.00)
GFR, Estimated: >60 mL/min (ref >60)
Glucose, Bld: 156 mg/dL — ABNORMAL HIGH (ref 70–99)
Potassium: 3.4 mmol/L — ABNORMAL LOW (ref 3.5–5.1)
Sodium: 138 mmol/L (ref 135–145)

## 2022-01-20 LAB — PROCALCITONIN: Procalcitonin: 0.1 ng/mL

## 2022-01-20 LAB — PHOSPHORUS: Phosphorus: 3 mg/dL (ref 2.5–4.6)

## 2022-01-20 LAB — BRAIN NATRIURETIC PEPTIDE: B Natriuretic Peptide: 22.3 pg/mL (ref 0.0–100.0)

## 2022-01-20 MED ORDER — MAGNESIUM SULFATE 4 GM/100ML IV SOLN
4.0000 g | Freq: Once | INTRAVENOUS | Status: AC
  Administered 2022-01-20: 4 g via INTRAVENOUS
  Filled 2022-01-20: qty 100

## 2022-01-20 MED ORDER — PANTOPRAZOLE SODIUM 40 MG PO TBEC: 40.0000 mg | DELAYED_RELEASE_TABLET | Freq: Every day | ORAL | Status: AC

## 2022-01-20 NOTE — Progress Notes (Signed)
Patient is being discharged home. Discharge instructions reviewed with patients daughter [name] at bedside. [name] verbalized full understanding of instructions including medication changes. EMS services will provide transport.

## 2022-01-20 NOTE — Progress Notes (Signed)
    Durable Medical Equipment  (From admission, onward)           Start     Ordered   01/20/22 1227  For home use only DME Walker rolling  Once       Question Answer Comment  Walker: With 5 Inch Wheels   Patient needs a walker to treat with the following condition Weakness      01/20/22 1227   01/20/22 1227  For home use only DME Hospital bed  Once       Question Answer Comment  Length of Need Lifetime   Patient has (list medical condition): CHF   The above medical condition requires: Patient requires the ability to reposition frequently   Head must be elevated greater than: 45 degrees   Bed type Semi-electric   Support Surface: Gel Overlay      01/20/22 1227

## 2022-01-20 NOTE — TOC Transition Note (Signed)
Transition of Care [facility]) - CM/SW Discharge Note   Patient Details  Name: Rebecca Wolf MRN: [id number] Date of Birth: 02-17-1925  [facility] of [facility] CM/SW Contact:  Beverly Sessions, RN Phone Number: 01/20/2022, 2:09 PM   Clinical Narrative:     Patient to be discharged today EMS packet printed to floor Daughter agreeable to hospital bed and RW.   Referral made to [facility] with adapt and DME will be delivered to the home.  Corene Cornea with [facility] notified of discharge     Barriers to Discharge: Continued Medical Work up   Patient Goals and CMS Choice Patient states their goals for this hospitalization and ongoing recovery are:: to go home CMS Medicare.gov Compare Post Acute Care list provided to:: Patient Choice offered to / list presented to : Patient  Discharge Placement                       Discharge Plan and Services                                     Social Determinants of Health (SDOH) Interventions     Readmission Risk Interventions     No data to display

## 2022-01-20 NOTE — Progress Notes (Signed)
GI Inpatient Follow-up Note  Subjective:  Patient seen in follow-up for Dr. Vicente Males this weekend. No acute events overnight. No new complaints. She did have temp 101 degrees last night without infection source. Chest x-ray showed low lung volumes and incentive spirometer was ordered but daughter reports hasn't received yet. No complaints of abdominal pain, nausea, vomiting, hematochezia, or melena. No bowel movements since last night.   Scheduled Inpatient Medications:   sodium chloride   Intravenous Once   budesonide  0.5 mg Inhalation BID   Chlorhexidine Gluconate Cloth  6 each Topical Q0600   montelukast  10 mg Oral Daily   pantoprazole  40 mg Oral BID   sodium chloride flush  3 mL Intravenous Q12H    Continuous Inpatient Infusions:    sodium chloride     sodium chloride     levETIRAcetam 500 mg (01/20/22 0849)    PRN Inpatient Medications:  sodium chloride, sodium chloride, acetaminophen, morphine injection, ondansetron (ZOFRAN) IV, ondansetron (ZOFRAN) IV, mouth rinse, oxyCODONE, sodium chloride flush, sodium chloride flush  Review of Systems: Constitutional: Weight is stable.  Eyes: No changes in vision. ENT: No oral lesions, sore throat.  GI: see HPI.  Heme/Lymph: No easy bruising.  CV: No chest pain.  GU: No hematuria.  Integumentary: No rashes.  Neuro: No headaches.  Psych: No depression/anxiety.  Endocrine: No heat/cold intolerance.  Allergic/Immunologic: No urticaria.  Resp: No cough, SOB.  Musculoskeletal: No joint swelling.    Physical Examination: BP 123/61 (BP Location: Right Arm)   Pulse 83   Temp 98.6 F (37 C) (Oral)   Resp 18   Ht '5\' 4"'$  (1.626 m)   Wt 72.2 kg   SpO2 96%   BMI 27.32 kg/m  Gen: NAD, alert and oriented x 4, son present in room  HEENT: PEERLA, EOMI, Neck: supple, no JVD or thyromegaly Chest: CTA bilaterally, no wheezes, crackles, or other adventitious sounds CV: RRR, no m/g/c/r Abd: soft, NT, ND, +BS in all four quadrants; no  HSM, guarding, ridigity, or rebound tenderness Ext: no edema, well perfused with 2+ pulses, Skin: no rash or lesions noted Lymph: no LAD  Data: Lab Results  Component Value Date   WBC 10.6 (H) 01/20/2022   HGB 10.3 (L) 01/20/2022   HCT 31.8 (L) 01/20/2022   MCV 83.5 01/20/2022   PLT 145 (L) 01/20/2022   Recent Labs  Lab 01/19/22 0615 01/19/22 2314 01/20/22 0613  HGB 10.3* 10.2* 10.3*   Lab Results  Component Value Date   NA 138 01/20/2022   K 3.4 (L) 01/20/2022   CL 103 01/20/2022   CO2 25 01/20/2022   BUN 12 01/20/2022   CREATININE 0.85 01/20/2022   Lab Results  Component Value Date   ALT 11 01/15/2022   AST 19 01/15/2022   ALKPHOS 83 01/15/2022   BILITOT <0.1 (L) 01/15/2022   Recent Labs  Lab 01/16/22 0726  APTT 31  INR 1.2   Assessment/Plan:  88 y/o AA female with a PMH of Hx of pulmonary embolism on Eliquis, HTN, systolic heart failure, CKD, Hx of seizures admitted 7/18 with epistaxis and melena. She is s/p embolization for active bleed in stomach. GI following.   Hematemesis/Melena - c/w active UGIB s/p embolization  - H&H stable currently with no overt gastrointestinal bleeding - No plans for repeat EGD at this time based on family wishes and hemodynamic stability  - Continue to monitor clinically - Continue IV PPI for gastric protection - Advance diet as tolerated -  Continue to avoid NSAIDs and hold Eliquis as deemed medically feasible  - Continue management of medical comorbidities per primary team  - GI following along and [facility] will resume her care tomorrow    Please call with questions or concerns.   Octavia Bruckner, PA-C [facility] [phone]

## 2022-01-21 ENCOUNTER — Encounter: Payer: Self-pay | Admitting: Nurse Practitioner

## 2022-01-25 NOTE — Discharge Summary (Signed)
Physician Discharge Summary   Patient: Rebecca Wolf MRN: [id number] DOB: 02/18/1925  Admit date:     01/15/2022  Discharge date: 01/20/2022  Discharge Physician: Annita Brod   PCP: Erline Levine, MD   Recommendations at discharge:   Medication change: Eliquis 2.5 mg p.o. twice daily.  This medication will be held for the next 4 weeks.  After this time, patient and her daughter in discussion with her PCP can determine whether this medicine should be resumed. Patient given referral to interventional radiology for IVC filter placement. Medication change: Cardizem discontinued. Patient will follow-up with home health physical therapy  Discharge Diagnoses: Principal Problem:   GI bleeding Active Problems:   Acute blood loss anemia   Chronic systolic CHF (congestive heart failure) (HCC)   Leukocytosis   Hypertension   Seizure disorder (HCC)   Pressure injury of skin   Hypomagnesemia   Overweight (BMI 25.0-29.9)  Resolved Problems:   Acute kidney injury in the setting of CKD (chronic kidney disease), stage II  Hospital Course: 88 year old female with past medical history of PE on Eliquis, hypertension, systolic heart failure with ejection fraction 45%, chronic kidney disease and seizures who presents to the emergency room on 7/18 with complaints of epistaxis and melena.  CT angiogram noted acute intraluminal gastric bleeding.  Initial blood pressure stable, but then patient started becoming tachycardic with mentation changes and she was transfused emergent blood as well as 1 L of normal saline.  Vascular surgery emergently took patient on 7/18 for microembolization of left gastric and right gastric vessels.  Gastroenterology consulted.  Since then, patient has been stable.  Vital signs stable no further episodes of either hematochezia or hematemesis.  Hemoglobin has been trending downward since admission.  Some of this was attributed to hemoconcentration as blood pressure and  heart rate have been stable.  On 7/21, hemoglobin down to 6.8 and patient was transfused 2 units packed red blood cells.  By 7/22, hemoglobin up to 10.3 and no change in 7/23 and patient felt to be stable, with no further evidence of bleeding.  Assessment and Plan: * GI bleeding History of PE on Eliquis.  Eliquis on hold.  Status post 1 unit packed red blood cell transfusion.  Patient with microembolization of left and right gastric vessels with "embolization of left gastric.  GI following although no need for EGD given stabilization by vascular surgery.  Acute blood loss anemia Baseline hemoglobin appears to be around 11, although was down to 8.9 in March.  Presented with hemoglobin of 9.2 on admission however this was hemoconcentrated so likely lower.  With fluids and additional blood, increased to 12, but has steadily declined and by 7/21 down to 6.9.  Transfused 2 units packed red blood cells, and by 7/22 up to 10.3.  On 7/23, hemoglobin unchanged and hemoglobin felt to be stable.  Chronic systolic CHF (congestive heart failure) (HCC) Initially dry.  Despite getting blood and IV fluids and diuretics on hold, BNP within normal limits.  BNP rechecked on 7/23 and still normal despite receiving 2 units packed red blood cells on 7/21.  Acute kidney injury in the setting of CKD (chronic kidney disease), stage II-resolved as of 01/18/2022 Secondary to blood loss anemia.  Status post blood transfusion and fluids.  Normalized by 7/21  Leukocytosis Stress margination brought on by bleed.  Procalcitonin normal.  Continues to improve.  Hypertension Holding home medications secondary to hypotension brought on by blood loss.  Seizure disorder (HCC) Stable, continue Keppra  Hypomagnesemia Replacing as needed  Pressure injury of skin Pressure Injury 01/15/22 Sacrum Medial Stage 2 -  Partial thickness loss of dermis presenting as a shallow open injury with a red, pink wound bed without slough. (Active)   01/15/22 1615  Location: Sacrum  Location Orientation: Medial  Staging: Stage 2 -  Partial thickness loss of dermis presenting as a shallow open injury with a red, pink wound bed without slough.  Wound Description (Comments):   Present on Admission: Yes    Stage II decubitus ulcer, present on admission.  Overweight (BMI 25.0-29.9) Meets criteria BMI greater than 25         Consultants: Gastroenterology Critical care Vascular surgery   Procedures: Coil embolization of left and right gastric vessels Transfusion 2 units packed red blood cells 7/21  Disposition: Home Diet recommendation:  Discharge Diet Orders (From admission, onward)     Start     Ordered   01/20/22 0000  Diet - low sodium heart healthy        01/20/22 1333           Cardiac diet DISCHARGE MEDICATION: Allergies as of 01/20/2022       Reactions   Levofloxacin Shortness Of Breath   Sulfa Antibiotics Rash        Medication List     STOP taking these medications    azithromycin 250 MG tablet Commonly known as: ZITHROMAX   diltiazem 180 MG 24 hr tablet Commonly known as: CARDIZEM LA   Eliquis 2.5 MG Tabs tablet Generic drug: apixaban       TAKE these medications    acetaminophen 500 MG tablet Commonly known as: TYLENOL Take 500 mg by mouth every 6 (six) hours as needed.   albuterol 1.25 MG/3ML nebulizer solution Commonly known as: ACCUNEB Inhale 1 ampule into the lungs 4 (four) times daily as needed.   azelastine 0.1 % nasal spray Commonly known as: ASTELIN U 1 SPRAY IEN BID TO REPLACE PATANASE   budesonide 0.5 MG/2ML nebulizer solution Commonly known as: PULMICORT Inhale 0.5 mg into the lungs 2 (two) times daily as needed.   fluticasone 50 MCG/ACT nasal spray Commonly known as: FLONASE Place 2 sprays into the nose daily.   levalbuterol 0.63 MG/3ML nebulizer solution Commonly known as: XOPENEX VVN Q 4 H PRN FOR WHZ   levETIRAcetam 750 MG tablet Commonly known  as: KEPPRA Take 1 tablet by mouth 2 (two) times daily.   magnesium oxide 400 (240 Mg) MG tablet Commonly known as: MAG-OX Take 1 tablet by mouth 2 (two) times daily.   montelukast 10 MG tablet Commonly known as: SINGULAIR Take 1 tablet by mouth daily.   pantoprazole 40 MG tablet Commonly known as: PROTONIX Take 1 tablet (40 mg total) by mouth daily. What changed: when to take this   predniSONE 2.5 MG tablet Commonly known as: DELTASONE Take 2.5 mg by mouth daily with breakfast.   spironolactone 25 MG tablet Commonly known as: ALDACTONE Take 1 tablet by mouth daily.               Discharge Care Instructions  (From admission, onward)           Start     Ordered   01/20/22 0000  Discharge wound care:       Comments: Sacral foam dressing placed over wound.  Peel back and assess every shift.   CHange foam every three days and PRN soilage.   01/20/22 1333  Discharge Exam: Filed Weights   01/17/22 0500 01/19/22 0500 01/20/22 0500  Weight: 77.2 kg 76.1 kg 72.2 kg   General: Alert and oriented x2, no acute distress Cardiovascular: Regular rate and rhythm, H2-C9, 2/6 systolic ejection murmur Lungs: Clear to auscultation bilaterally  Condition at discharge: good  The results of significant diagnostics from this hospitalization (including imaging, microbiology, ancillary and laboratory) are listed below for reference.   Imaging Studies: DG Chest Port 1 View  Result Date: 01/19/2022 CLINICAL DATA:  Fever. EXAM: PORTABLE CHEST 1 VIEW COMPARISON:  January 15, 2022 FINDINGS: The cardiac silhouette is normal in size. Low lung volumes are noted. Mild to moderate severity linear scarring and/or atelectasis is seen within the medial aspect of the left upper lobe. This is increased in severity when compared to the prior exam. There is no evidence of focal consolidation, pleural effusion or pneumothorax. Surgical sutures are seen along the suprahilar region on the  right. Degenerative changes noted throughout the thoracic spine. IMPRESSION: Mild to moderate severity left upper lobe linear scarring and/or atelectasis. Electronically Signed   By: Virgina Norfolk M.D.   On: 01/19/2022 23:55   PERIPHERAL VASCULAR CATHETERIZATION  Result Date: 01/15/2022 See surgical note for result.  CT Angio Abd/Pel W and/or Wo Contrast  Result Date: 01/15/2022 CLINICAL DATA:  GI bleeding. EXAM: CTA ABDOMEN AND PELVIS WITHOUT AND WITH CONTRAST TECHNIQUE: Multidetector CT imaging of the abdomen and pelvis was performed using the standard protocol during bolus administration of intravenous contrast. Multiplanar reconstructed images and MIPs were obtained and reviewed to evaluate the vascular anatomy. RADIATION DOSE REDUCTION: This exam was performed according to the departmental dose-optimization program which includes automated exposure control, adjustment of the mA and/or kV according to patient size and/or use of iterative reconstruction technique. CONTRAST:  142m OMNIPAQUE IOHEXOL 350 MG/ML SOLN COMPARISON:  Chest CT-06/29/2017; 01/23/2017 FINDINGS: VASCULAR Aorta: There is a moderate amount of mixed calcified and noncalcified atherosclerotic plaque within a markedly tortuous but normal caliber abdominal aorta, not resulting in a hemodynamically significant stenosis. No evidence of abdominal aortic dissection or perivascular stranding. Celiac: There is a minimal amount of mixed calcified and noncalcified atherosclerotic plaque involving the origin of the celiac artery, not resulting in a hemodynamically significant stenosis. The left gastric artery has a conventional takeoff from the celiac trunk. SMA: Minimal amount of noncalcified atherosclerotic plaque involves the origin of the SMA as well as the main trunk of the SMA, not resulting in hemodynamically significant stenosis. The major branch vessels of the SMA appear widely patent. Conventional branching pattern. No discrete lumen  filling defects to suggest distal embolism. Renals: Solitary bilaterally noncalcified atherosclerotic plaque involves the origin of the right renal artery approaching 50% luminal narrowing (coronal image 57, series 12). There is a minimal amount of mixed calcified and noncalcified atherosclerotic plaque involving the origin of the left renal artery, not resulting in hemodynamically significant stenosis. No vessel irregularity to suggest FMD. IMA: Diseased at its origin though remains patent. Inflow: There is a minimal amount of eccentric calcified atherosclerotic plaque involving the bilateral common iliac arteries, not resulting in hemodynamically significant stenosis. The bilateral internal iliac arteries are disease though patent and of normal caliber. The bilateral external iliac arteries are of normal caliber and widely patent without hemodynamically significant narrowing. Proximal Outflow: There is a minimal amount of calcified atherosclerotic plaque involving the bilateral common femoral arteries, not resulting in hemodynamically significant stenosis. The imaged portions of the bilateral superficial and deep femoral arteries are of normal  caliber and widely patent throughout their imaged courses. Veins: The IVC and pelvic venous systems appear widely patent. Review of the MIP images confirms the above findings. _________________________________________________________ NON-VASCULAR Lower chest: Limited visualization of the lower thorax demonstrates minimal bibasilar dependent subsegmental atelectasis. No focal airspace opacities. No pleural effusion. Note is made of a punctate (approximately 3 mm) right lower lobe pulmonary nodule (image 4, series 6) which is unchanged compared to remote chest CT performed 03/2016 and thus of benign etiology. Hepatobiliary: Normal hepatic contour. No discrete hepatic lesions. Normal appearance of the gallbladder given degree distention. No radiopaque gallstones. No intra or  extrahepatic biliary ductal dilatation. No ascites. Pancreas: Normal appearance of the pancreas. Spleen: Normal appearance of the spleen. Adrenals/Urinary Tract: There is symmetric enhancement and excretion of the bilateral kidneys. Note is made of an approximately 1.6 cm hypoattenuating right-sided renal lesion which is incompletely characterized on the present examination and while potentially representative of a minimally complex cyst appears to demonstrate minimal enhancement (axial image 66, series 16; image 61, series 9 and image 27, series 4). There is a approximally 2.0 cm hypoattenuating partially exophytic nonenhancing cyst arising from the inferior pole of the right kidney (image 84, series 16). No discrete left-sided renal lesions. Calcifications about the bilateral renal hila may be vascular in etiology. No urinary obstruction or perinephric stranding. Normal appearance the bilateral adrenal glands. Normal appearance of the urinary bladder given degree of distention. Stomach/Bowel: There is an ill-defined area of intraluminal active extravasation about the lesser curvature of the stomach (axial image 20, series 9; coronal image 57, series 6), with pooling seen on the delayed phase imaging (representative axial image 22, series 16). There is a large amount of high density debris within the gastric lumen compatible with blood products. Large liquid stool burden within the colon without evidence of enteric obstruction. Postoperative change of the cecum. Normal appearance of the terminal ileum. No pneumoperitoneum, pneumatosis or portal venous gas. Lymphatic: No bulky retroperitoneal, mesenteric, pelvic or inguinal lymphadenopathy. Reproductive: Post hysterectomy. No discrete adnexal lesions. No free fluid the pelvic cul-de-sac. Other: Tiny mesenteric fat containing periumbilical hernia. Note is also made of a smaller midline ventral wall presumably incisional hernia. Subcutaneous edema about the midline of  the low back. Musculoskeletal: No acute or aggressive osseous abnormalities. Moderate degenerative change the bilateral hips with joint space loss, subchondral sclerosis and osteophytosis. Moderate rotatory scoliotic curvature of the lumbar spine with associated multilevel moderate to severe DDD, worse about the central concavity of the scoliotic curvature. IMPRESSION: VASCULAR 1. The examination is positive for acute intraluminal gastric bleeding centered at the lesser curvature of the stomach with large amount of blood products within the stomach. The etiology of the bleeding is not depicted on this examination and thus may be secondary to a ulcer. Specifically, there is no stigmata of portal venous hypertension or definitive gastric or esophageal varices. Further evaluation and potential management with endoscopy is advised. 2. Moderate amount of calcified atherosclerotic plaque within a tortuous but normal caliber abdominal aorta, not resulting in hemodynamically significant stenosis. Aortic Atherosclerosis (ICD10-I70.0). 3. Approximately 50% luminal narrowing involving the origin of the right renal artery without associated asymmetric renal atrophy or delayed renal enhancement. NON-VASCULAR 1. Indeterminate approximately 1.6 cm hypoattenuating right-sided renal lesion, potentially representative of a minimally complex renal cyst though incompletely evaluated on the present examination. Comparison with prior outside examinations (if available), is advised. Otherwise, further evaluation with nonemergent abdominal MRI could performed as indicated. Critical Value/emergent results were called by  telephone at the time of interpretation on 01/15/2022 at 10:22 am to provider [facility] , who verbally acknowledged these results. Electronically Signed   By: Sandi Mariscal M.D.   On: 01/15/2022 10:32   DG Chest Portable 1 View  Result Date: 01/15/2022 CLINICAL DATA:  Shortness of breath.  Nose bleed. EXAM: PORTABLE  CHEST 1 VIEW COMPARISON:  06/29/2017 FINDINGS: Heart size is normal. Aorta is tortuous. Lungs are free of focal consolidations and pleural effusions. Mild spondylosis associated with scoliosis of the thoracolumbar spine. Degenerative changes in both shoulders. IMPRESSION: No evidence for acute cardiopulmonary abnormality. Electronically Signed   By: Nolon Nations M.D.   On: 01/15/2022 08:10    Microbiology: Results for orders placed or performed during the hospital encounter of 01/15/22  MRSA Next Gen by PCR, Nasal     Status: None   Collection Time: 01/15/22  4:18 PM   Specimen: Nasal Mucosa; Nasal Swab  Result Value Ref Range Status   MRSA by PCR Next Gen NOT DETECTED NOT DETECTED Final    Comment: (NOTE) The GeneXpert MRSA Assay (FDA approved for NASAL specimens only), is one component of a comprehensive MRSA colonization surveillance program. It is not intended to diagnose MRSA infection nor to guide or monitor treatment for MRSA infections. Test performance is not FDA approved in patients less than 78 years old. Performed at [facility], [address] [place], [place] [address]     Labs: CBC: Recent Labs  Lab 01/19/22 0615 01/19/22 2314 01/20/22 0613  WBC 10.9* 11.7* 10.6*  NEUTROABS  --  7.9*  --   HGB 10.3* 10.2* 10.3*  HCT 31.0* 30.9* 31.8*  MCV 81.8 82.4 83.5  PLT 134* 143* 761*   Basic Metabolic Panel: Recent Labs  Lab 01/19/22 0615 01/19/22 2314 01/20/22 0613  NA 139  --  138  K 3.2*  --  3.4*  CL 104  --  103  CO2 27  --  25  GLUCOSE 103*  --  156*  BUN 14  --  12  CREATININE 0.86  --  0.85  CALCIUM 8.0*  --  8.2*  MG 1.5* 1.4*  --   PHOS 3.1  --  3.0   Liver Function Tests: No results for input(s): "AST", "ALT", "ALKPHOS", "BILITOT", "PROT", "ALBUMIN" in the last 168 hours. CBG: No results for input(s): "GLUCAP" in the last 168 hours.  Discharge time spent: less than 30 minutes.  Signed: Annita Brod, MD [facility] 01/25/2022

## 2022-02-08 ENCOUNTER — Inpatient Hospital Stay: Payer: Medicare Other

## 2022-03-12 ENCOUNTER — Ambulatory Visit: Payer: Medicare Other | Admitting: Oncology

## 2022-03-12 ENCOUNTER — Ambulatory Visit: Payer: Medicare Other

## 2022-03-12 ENCOUNTER — Other Ambulatory Visit: Payer: Medicare Other

## 2022-03-15 ENCOUNTER — Inpatient Hospital Stay: Payer: Medicare Other | Attending: Oncology

## 2022-03-15 DIAGNOSIS — E538 Deficiency of other specified B group vitamins: Secondary | ICD-10-CM | POA: Insufficient documentation

## 2022-03-15 DIAGNOSIS — D509 Iron deficiency anemia, unspecified: Secondary | ICD-10-CM

## 2022-03-15 MED ORDER — CYANOCOBALAMIN 1000 MCG/ML IJ SOLN
1000.0000 ug | Freq: Once | INTRAMUSCULAR | Status: AC
  Administered 2022-03-15: 1000 ug via INTRAMUSCULAR
  Filled 2022-03-15: qty 1

## 2022-03-22 ENCOUNTER — Encounter: Payer: Self-pay | Admitting: Physical Therapy

## 2022-03-22 ENCOUNTER — Ambulatory Visit: Payer: Medicare Other | Attending: Internal Medicine | Admitting: Physical Therapy

## 2022-03-22 DIAGNOSIS — R262 Difficulty in walking, not elsewhere classified: Secondary | ICD-10-CM | POA: Diagnosis present

## 2022-03-22 DIAGNOSIS — M6281 Muscle weakness (generalized): Secondary | ICD-10-CM | POA: Diagnosis present

## 2022-03-22 DIAGNOSIS — R2689 Other abnormalities of gait and mobility: Secondary | ICD-10-CM | POA: Insufficient documentation

## 2022-03-22 DIAGNOSIS — Z9181 History of falling: Secondary | ICD-10-CM | POA: Diagnosis present

## 2022-03-22 DIAGNOSIS — R293 Abnormal posture: Secondary | ICD-10-CM | POA: Diagnosis present

## 2022-03-22 NOTE — Therapy (Signed)
OUTPATIENT PHYSICAL THERAPY EVALUATION   Patient Name: TALLULAH HOSMAN MRN: [id number] DOB:Sep 10, 1924, 88 y.o., female Today's Date: 03/22/2022   PT End of Session - 03/22/22 1115     Visit Number 1    Number of Visits 10    Date for PT Re-Evaluation 05/31/22    Authorization Type Medicaid UHC    PT Start Time 1107    PT Stop Time 1601    PT Time Calculation (min) 38 min    Activity Tolerance Patient tolerated treatment well    Behavior During Therapy [facility] for tasks assessed/performed             Past Medical History:  Diagnosis Date   Asthma    Breast cancer ([place]) 2003   LT LUMPECTOMY   Breast cancer ([place]) 2004   LT LUMPECTOMY   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   GERD (gastroesophageal reflux disease)    Hemorrhoids    Hypertension    Personal history of chemotherapy 2004   BREAST CA   Personal history of radiation therapy 2003   BREAST CA   Personal history of radiation therapy 2004   BREAST CA   Polyp of colon 2002   bleeding polyp of right ascending colon   PUD (peptic ulcer disease)    Seizures ([place])    epilepsy well controlled   Past Surgical History:  Procedure Laterality Date   BREAST EXCISIONAL BIOPSY Left 2003   positive   BREAST EXCISIONAL BIOPSY Left 2004   positive   BREAST LUMPECTOMY Left 2003   BREAST CA   BREAST LUMPECTOMY Left 2004   BREAST CA   EMBOLIZATION N/A 01/15/2022   Procedure: EMBOLIZATION;  Surgeon: Katha Cabal, MD;  Location: [facility];  Service: Cardiovascular;  Laterality: N/A;   HEMICOLECTOMY Right    bowel obstruction   HEMORRHOID SURGERY     THORACOTOMY/LOBECTOMY  1999   carcinoid tumor   TOTAL VAGINAL HYSTERECTOMY     VARICOSE VEIN SURGERY     Patient Active Problem List   Diagnosis Date Noted   Acute blood loss anemia 01/16/2022   Overweight (BMI 25.0-29.9) 09/32/3557   Chronic systolic CHF (congestive heart failure) ([place]) 01/16/2022   Hypomagnesemia    Leukocytosis    GI  bleeding 01/15/2022   GIB (gastrointestinal bleeding) 01/15/2022   Pressure injury of skin 01/15/2022   B12 deficiency 10/17/2019   Folate deficiency 10/17/2019   Anemia 08/29/2019   Iron deficiency anemia 08/26/2019   Chronic anticoagulation 10/25/2018   CKD stage G3b/A1, GFR 30-44 and albumin creatinine ratio <30 mg/g (HCC) 10/05/2018   MGUS (monoclonal gammopathy of unknown significance) 07/23/2018   Osteopenia after menopause 07/16/2018   Diabetes mellitus with no complication ([place]) 32/20/2542   Vitamin D deficiency 04/07/2018   Heart palpitations 01/06/2018   Tachycardia 01/06/2018   Cystocele with rectocele 10/30/2017   Rectocele 10/30/2017   Microcytic red blood cells 09/23/2017   Localized edema 02/13/2017   Hemorrhoids 05/17/2016   Obesity (BMI 30-39.9) 04/26/2016   Multiple lung nodules 04/19/2016   Chest pain 04/17/2016   Partial symptomatic epilepsy with complex partial seizures, not intractable, without status epilepticus ([place]) 06/30/2015   Bronchiectasis without complication ([place]) 70/62/3762   Imbalance 11/08/2014   Hypertension 11/08/2014   Personal history of malignant neoplasm of breast 11/08/2014   History of malignant carcinoid tumor of bronchus and lung 11/08/2014   H/O peptic ulcer 11/08/2014   Asthma, mild intermittent 11/08/2014   Pulmonary embolism ([place])  11/08/2014   Gonalgia 11/08/2014   Seizure disorder ([place]) 11/08/2014   Leg varices 11/08/2014    PCP: Kimel-Scott   REFERRING PROVIDER:Kimel-Scott   REFERRING DIAG: Anal Prolapse   Rationale for Evaluation and Treatment Rehabilitation  THERAPY DIAG:  Anal prolapse  ONSET DATE:  10 years   SUBJECTIVE:                                                                                                                                                                                           SUBJECTIVE STATEMENT: Daughter [name] present and provided most of the information. Pt repeats back to pt what  therapists.    1)  night time voiding : pt gets up every 2-3 hours  with the urge to pee during the night   Pt was tested for sleep apnea more than 10 years and she was positive. Pt tried wearing a CPAP machine but she was not comfortable.    Pt had frequent urination which is better with Oxybutin.  Pt had UTI-like Sx twice which pt and dtr has sought out medical attention for. A week ago, UTI cultures showed neg but she has been put on antibiotics last week.   Pt does not have incontinence but has the urge to go at night and nothing comes out.    Medical updates:   ED and hospitalization 7/18 to 01/20/22 due to bleeding from mouth ( vomitting and anally).  Her medication had changed to stop GI hemorrhage. Pt received blood transfusions.  A procedure was done that down   Pt had a fall on 03/14/22 when transferring herself from sofa to bedside commode and hit her R hip. No injury and not medical attention.    Pt just stopped Home PT yesterday.      139/ 71 BP,  100% Sat O2,    PERTINENT HISTORY:    GI hermorrhage, anal prolapse and fecal leakage has improved which dtr thinks is related to medication Oxybutin, 3 vaginal deliveries and hysterectomy 2002.    PAIN:  Are you having pain? Yes:    PRECAUTIONS: None  WEIGHT BEARING RESTRICTIONS No  FALLS:  Has patient fallen in last 6 months? One fall last week , no injuries. She fell on her R hip and had only bruises.   LIVING ENVIRONMENT: Lives with: lives with their family Lives in: House/apartment Stairs: yes, two story, pt lives on the first floor  Has following equipment at home: RW and rollator   OCCUPATION:    PLOF: Independent with household mobility with device  PATIENT GOALS   Dtr reports: not getting up as much at night,  more bladder control,    OBJECTIVE:    [facility] PT Assessment - 03/22/22 1441       Coordination   Coordination and Movement Description chest breathing      Posture/Postural Control   Posture  Comments thoracic kyphosis significant      AROM   Overall AROM Comments cervical / scapular retraction no difficulty      Strength   Overall Strength Comments BLE 4+/5      Transfers   Comments IND with STS with RW from chair with arms .  dtr provided assistance with STS out of WC to chair . dtr demo'd mod/max assist . pt demo'd lateral step for t/f .             [facility] PT Treatment/Exercise - 03/22/22 1441       Therapeutic Activites    Therapeutic Activities Other Therapeutic Activities    Other Therapeutic Activities faciliated communication with PCP re: sleep study referral and fall at home on 03/14/22 , active listening and questions about recent medical conditions, provided dtr ino on link between OSA and nocturia      Neuro Re-ed    Neuro Re-ed Details  cued dtr on proper t/f technique to allow for pt to participate more , with use of gait belt                 HOME EXERCISE PROGRAM: See pt instruction section    ASSESSMENT:  CLINICAL IMPRESSION:   Pt is a  88  yo  who presents with  night time voiding and pt gets up every 2-3 hours  with the urge to pee during the night. Pt does not have incontinence but has the urge to go at night and nothing comes out.   Pt had frequent urination which is better with Oxybutin.  Pt had UTI-like Sx twice which pt and dtr has sought out medical attention for. A week ago, UTI cultures showed neg but she has been put on antibiotics last week.   Pt's musculoskeletal assessment showed significant thoracic kyphosis, rounded shoulders, dependent on family assistance for t/f to another chair without RW. Pt demo'd IND with RW when t/f from chair to [place].   Dtr was present with answered majority of questions. Pt was able to follow spinal AROM exercises and reported no pain.   Focused on learning about pt's recent medical conditions:  ED and hospitalization 7/18 to 01/20/22 due to bleeding from mouth ( vomitting and anally).  Her  medication had changed to stop GI hemorrhage. Pt received blood transfusions.  A procedure was done that down   Pt had a fall on 03/14/22 when transferring herself from sofa to bedside commode and hit her R hip. No injury and not medical attention.    Pt just stopped Home PT yesterday.    Provided education about relationship between nocturia and OSA and research articles were emailed to dtr. Left voicemail on nurse's line at PCP office re: sleep study referral because dtr reports pt was Dx with OSA more than 10 years ago but pt was non-compliant with CPAP.    Also left message re: pt's recent fall at home.    Plan to address thoracic kyphosis, assess abdominal and pelvic floor to address UTI-like Sx to ensure proper lengthening of pelvic floor. Plan to administer FOTO at next session.   Pt benefits from skilled PT.    OBJECTIVE IMPAIRMENTS decreased activity tolerance, decreased coordination, decreased endurance, decreased mobility, difficulty  walking, decreased ROM, decreased strength, decreased safety awareness, hypomobility, increased muscle spasms, impaired flexibility, improper body mechanics, postural dysfunction,   ACTIVITY LIMITATIONS  self-care,  sleep,    PARTICIPATION LIMITATIONS:  community,   PERSONAL FACTORS   Past Hx of OSA and noncompliance to CPAP are also affecting patient's functional outcome.    REHAB POTENTIAL: Good   CLINICAL DECISION MAKING: Evolving/moderate complexity   EVALUATION COMPLEXITY: Moderate    PATIENT EDUCATION:    Education details: Showed pt anatomy images. Explained muscles attachments/ connection, physiology of deep core system/ spinal- thoracic-pelvis-lower kinetic chain as they relate to pt's presentation, Sx, and past Hx. Explained what and how these areas of deficits need to be restored to balance and function    See Therapeutic activity / neuromuscular re-education section  Answered pt's questions.   Person educated:  Patient Education method: Explanation, Demonstration, Tactile cues, Verbal cues, and Handouts Education comprehension: verbalized understanding, returned demonstration, verbal cues required, tactile cues required, and needs further education     PLAN: PT FREQUENCY: 1x/week   PT DURATION: 10 weeks   PLANNED INTERVENTIONS: Therapeutic exercises, Therapeutic activity, Neuromuscular re-education, Balance training, Gait training, Patient/Family education, Self Care, Joint mobilization, Spinal mobilization, Moist heat, Taping, and Manual therapy.   PLAN FOR NEXT SESSION: See clinical impression for plan     GOALS: Goals reviewed with patient? Yes  SHORT TERM GOALS: Target date: 04/19/2022    Pt will demo IND with HEP                    Baseline: Not IND            Goal status: INITIAL   LONG TERM GOALS: Target date: 05/31/2022    1.Pt will demo proper deep core coordination without chest breathing and optimal excursion of diaphragm/pelvic floor in order to promote spinal stability and pelvic floor function  Baseline: dyscoordination Goal status: INITIAL  2.  Pt will demo > 5 pt change on FOTO  to improve QOL and function ( plan to assess at next session)    Pelvic Pain baseline - PFDI Urinary baseline - Bowel  constipation baseline - Bowel Leakage baseline - Urinary Problem baseline- PFDI Bowel -    Goal status: INITIAL  3.  Pt will demo proper body mechanics in against gravity tasks and ADLs  work tasks, fitness  to minimize straining pelvic floor / back                  Baseline: not IND, improper form that places strain on pelvic floor                Goal status: INITIAL    4. Pt will be IND with scapular/ thoracic extension HEP to minimize thoracic kyphosis to improve IAP system and deep core / pelvic function Baseline:  Goal status: INITIAL    5. Pt will undergo updated sleep study to rule in/out OSA given the association with nocturia with OSA  Baseline:   Pt was Dx with OSA more than 10 years ago but was non compliant with CPAP.  Goal status: INITIAL   6. Pt's dtr will demo proper body mechanics for t/f technique to allow for pt to participate more , with use of gait belt and for dtr to minimize back injuries  Baseline:  limited knowledge for proper t/f  Goal status: INITIAL    Jerl Mina, PT 03/22/2022, 11:21 AM

## 2022-03-29 ENCOUNTER — Ambulatory Visit: Payer: Medicare Other | Admitting: Physical Therapy

## 2022-03-29 ENCOUNTER — Other Ambulatory Visit: Payer: Self-pay

## 2022-03-29 DIAGNOSIS — Z9181 History of falling: Secondary | ICD-10-CM

## 2022-03-29 DIAGNOSIS — R293 Abnormal posture: Secondary | ICD-10-CM

## 2022-03-29 DIAGNOSIS — R262 Difficulty in walking, not elsewhere classified: Secondary | ICD-10-CM

## 2022-03-29 DIAGNOSIS — R2689 Other abnormalities of gait and mobility: Secondary | ICD-10-CM

## 2022-03-29 DIAGNOSIS — D509 Iron deficiency anemia, unspecified: Secondary | ICD-10-CM

## 2022-03-29 DIAGNOSIS — M6281 Muscle weakness (generalized): Secondary | ICD-10-CM

## 2022-03-29 MED FILL — Iron Sucrose Inj 20 MG/ML (Fe Equiv): INTRAVENOUS | Qty: 10 | Status: AC

## 2022-03-29 NOTE — Patient Instructions (Signed)
Pillow behind back  Inhale tall, exhale  Pulling apart yellow band  10 reps  __  Twist   10 reps  ___  Pull above head  10 reps

## 2022-03-29 NOTE — Therapy (Signed)
OUTPATIENT PHYSICAL THERAPY Treatment    Patient Name: Rebecca Wolf MRN: [id number] DOB:[birth year], 88 y.o., female Today's Date: 03/29/2022   PT End of Session - 03/29/22 1116     Visit Number 2    Number of Visits 10    Date for PT Re-Evaluation 05/31/22    Authorization Type Medicaid UHC    PT Start Time 1108    PT Stop Time 1146    PT Time Calculation (min) 37 min    Activity Tolerance Patient tolerated treatment well    Behavior During Therapy Rebecca Wolf for tasks assessed/performed             Past Medical History:  Diagnosis Date   Asthma    Breast cancer ([place]) 2003   LT LUMPECTOMY   Breast cancer ([place]) 2004   LT LUMPECTOMY   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   GERD (gastroesophageal reflux disease)    Hemorrhoids    Hypertension    Personal history of chemotherapy 2004   BREAST CA   Personal history of radiation therapy 2003   BREAST CA   Personal history of radiation therapy 2004   BREAST CA   Polyp of colon 2002   bleeding polyp of right ascending colon   PUD (peptic ulcer disease)    Seizures ([place])    epilepsy well controlled   Past Surgical History:  Procedure Laterality Date   BREAST EXCISIONAL BIOPSY Left 2003   positive   BREAST EXCISIONAL BIOPSY Left 2004   positive   BREAST LUMPECTOMY Left 2003   BREAST CA   BREAST LUMPECTOMY Left 2004   BREAST CA   EMBOLIZATION N/A 01/15/2022   Procedure: EMBOLIZATION;  Surgeon: Rebecca Cabal, MD;  Location: [facility];  Service: Cardiovascular;  Laterality: N/A;   HEMICOLECTOMY Right    bowel obstruction   HEMORRHOID SURGERY     THORACOTOMY/LOBECTOMY  1999   carcinoid tumor   TOTAL VAGINAL HYSTERECTOMY     VARICOSE VEIN SURGERY     Patient Active Problem List   Diagnosis Date Noted   Acute blood loss anemia 01/16/2022   Overweight (BMI 25.0-29.9) 93/79/0240   Chronic systolic CHF (congestive heart failure) ([place]) 01/16/2022   Hypomagnesemia    Leukocytosis    GI  bleeding 01/15/2022   GIB (gastrointestinal bleeding) 01/15/2022   Pressure injury of skin 01/15/2022   B12 deficiency 10/17/2019   Folate deficiency 10/17/2019   Anemia 08/29/2019   Iron deficiency anemia 08/26/2019   Chronic anticoagulation 10/25/2018   CKD stage G3b/A1, GFR 30-44 and albumin creatinine ratio <30 mg/g (HCC) 10/05/2018   MGUS (monoclonal gammopathy of unknown significance) 07/23/2018   Osteopenia after menopause 07/16/2018   Diabetes mellitus with no complication ([place]) 97/35/3299   Vitamin D deficiency 04/07/2018   Heart palpitations 01/06/2018   Tachycardia 01/06/2018   Cystocele with rectocele 10/30/2017   Rectocele 10/30/2017   Microcytic red blood cells 09/23/2017   Localized edema 02/13/2017   Hemorrhoids 05/17/2016   Obesity (BMI 30-39.9) 04/26/2016   Multiple lung nodules 04/19/2016   Chest pain 04/17/2016   Partial symptomatic epilepsy with complex partial seizures, not intractable, without status epilepticus ([place]) 06/30/2015   Bronchiectasis without complication ([place]) 24/26/8341   Imbalance 11/08/2014   Hypertension 11/08/2014   Personal history of malignant neoplasm of breast 11/08/2014   History of malignant carcinoid tumor of bronchus and lung 11/08/2014   H/O peptic ulcer 11/08/2014   Asthma, mild intermittent 11/08/2014   Pulmonary embolism (  [place]) 11/08/2014   Gonalgia 11/08/2014   Seizure disorder ([place]) 11/08/2014   Leg varices 11/08/2014    PCP: Rebecca Wolf   REFERRING PROVIDER:Kimel-Scott   REFERRING DIAG: Anal Prolapse   Rationale for Evaluation and Treatment Rehabilitation  THERAPY DIAG:  Abnormal posture  Other abnormalities of gait and mobility  Difficulty in walking, not elsewhere classified  Muscle weakness (generalized)  History of falling  ONSET DATE:  10 years   SUBJECTIVE:                                                                                                                                                                                            SUBJECTIVE STATEMENT: Daughter Rebecca Wolf present and provided most of the information. Pt repeats back to pt what therapist is saying.     Pt 's dtr reported that PCP had helped pt last week with increased  KCL medication and oxybutin which helped with pt's shakiness of hand. Pt slept well last night and woke up wanting to wash herself. Dtr noticed increased alertness and not shakiness of hands.   Every now and then, she has fecal incontinence with sit to stand and if she has something bad to eat.    Pt states she prefers to lean forward in chair because it feels better at her anus.   PERTINENT HISTORY:    GI hermorrhage, anal prolapse and fecal leakage has improved which dtr thinks is related to medication Oxybutin, 3 vaginal deliveries and hysterectomy 2002.  Medical updates:   ED and hospitalization 7/18 to 01/20/22 due to bleeding from mouth ( vomitting and anally).  Her medication had changed to stop GI hemorrhage. Pt received blood transfusions.  A procedure was done that down   Pt had a fall on 03/14/22 when transferring herself from sofa to bedside commode and hit her R hip. No injury and not medical attention.     PAIN:  Are you having pain? Yes:    PRECAUTIONS: None  WEIGHT BEARING RESTRICTIONS No  FALLS:  Has patient fallen in last 6 months? One fall last week , no injuries. She fell on her R hip and had only bruises.   LIVING ENVIRONMENT: Lives with: lives with their family Lives in: House/apartment Stairs: yes, two story, pt lives on the first floor  Has following equipment at home: RW and rollator   OCCUPATION:    PLOF: Independent with household mobility with device  PATIENT GOALS   Dtr reports: not getting up as much at night, more bladder control,    OBJECTIVE:     [facility] PT Assessment - 03/29/22 1313  Observation/Other Assessments   Observations Able to self-correct with cervical/ scapular retraction with  verbal cues      Coordination   Coordination and Movement Description ability to coordinate deep core with spinal strengthening exercisings ( downgraded to yellow band)             [facility] Adult PT Treatment/Exercise - 03/29/22 1312       Therapeutic Activites    Other Therapeutic Activities provided pillow behind back to provide tactile cues for upright posture, discussed with dtr to provide videos of her t/f at home to minimize straining pelvic floor      Neuro Re-ed    Neuro Re-ed Details  cued for scapular retraction, trunk rotation with exhalation to minimize straining pelvic floor              HOME EXERCISE PROGRAM: See pt instruction section    ASSESSMENT:  CLINICAL IMPRESSION:   Addressed thoracic kyphosis with resistance band strengthening. Downgraded to yellow band instead of red band. Pt able to perform IND with verbal and visual cues. Explained to dtr and pt about the importance of minimizing slumped / sacral sitting to minimize worsening of anal prolapse, skin sores, and incontinence.   Plan assess abdominal and pelvic floor.   Asked dtr to film her home t/f and will review next session to minimize straining pelvic floor and minimizing anal prolapse/ fecal leakage.   Plan to administer FOTO at next session.     Pt benefits from skilled PT.    OBJECTIVE IMPAIRMENTS decreased activity tolerance, decreased coordination, decreased endurance, decreased mobility, difficulty walking, decreased ROM, decreased strength, decreased safety awareness, hypomobility, increased muscle spasms, impaired flexibility, improper body mechanics, postural dysfunction,   ACTIVITY LIMITATIONS  self-care,  sleep,    PARTICIPATION LIMITATIONS:  community,   PERSONAL FACTORS   Past Hx of OSA and noncompliance to CPAP are also affecting patient's functional outcome.    REHAB POTENTIAL: Good   CLINICAL DECISION MAKING: Evolving/moderate complexity   EVALUATION COMPLEXITY:  Moderate    PATIENT EDUCATION:    Education details: Showed pt anatomy images. Explained muscles attachments/ connection, physiology of deep core system/ spinal- thoracic-pelvis-lower kinetic chain as they relate to pt's presentation, Sx, and past Hx. Explained what and how these areas of deficits need to be restored to balance and function    See Therapeutic activity / neuromuscular re-education section  Answered pt's questions.   Person educated: Patient Education method: Explanation, Demonstration, Tactile cues, Verbal cues, and Handouts Education comprehension: verbalized understanding, returned demonstration, verbal cues required, tactile cues required, and needs further education     PLAN: PT FREQUENCY: 1x/week   PT DURATION: 10 weeks   PLANNED INTERVENTIONS: Therapeutic exercises, Therapeutic activity, Neuromuscular re-education, Balance training, Gait training, Patient/Family education, Self Care, Joint mobilization, Spinal mobilization, Moist heat, Taping, and Manual therapy.   PLAN FOR NEXT SESSION: See clinical impression for plan     GOALS: Goals reviewed with patient? Yes  SHORT TERM GOALS: Target date: 04/19/2022    Pt will demo IND with HEP                    Baseline: Not IND            Goal status: INITIAL   LONG TERM GOALS: Target date: 05/31/2022    1.Pt will demo proper deep core coordination without chest breathing and optimal excursion of diaphragm/pelvic floor in order to promote spinal stability and pelvic floor function  Baseline: dyscoordination Goal  status: INITIAL  2.  Pt will demo > 5 pt change on FOTO  to improve QOL and function ( plan to assess at next session)    Pelvic Pain baseline - PFDI Urinary baseline - Bowel  constipation baseline - Bowel Leakage baseline - Urinary Problem baseline- PFDI Bowel -    Goal status: INITIAL  3.  Pt will demo proper body mechanics in against gravity tasks and ADLs  work tasks, fitness  to  minimize straining pelvic floor / back                  Baseline: not IND, improper form that places strain on pelvic floor                Goal status: INITIAL    4. Pt will be IND with scapular/ thoracic extension HEP to minimize thoracic kyphosis to improve IAP system and deep core / pelvic function Baseline:  Goal status: INITIAL    5. Pt will undergo updated sleep study to rule in/out OSA given the association with nocturia with OSA  Baseline:  Pt was Dx with OSA more than 10 years ago but was non compliant with CPAP.  Goal status: INITIAL   6. Pt's dtr will demo proper body mechanics for t/f technique to allow for pt to participate more , with use of gait belt and for dtr to minimize back injuries  Baseline:  limited knowledge for proper t/f  Goal status: INITIAL    Jerl Mina, PT 03/29/2022, 1:15 PM

## 2022-04-01 ENCOUNTER — Inpatient Hospital Stay: Payer: Medicare Other

## 2022-04-01 ENCOUNTER — Inpatient Hospital Stay: Payer: Medicare Other | Admitting: Oncology

## 2022-04-02 ENCOUNTER — Ambulatory Visit: Payer: Medicare Other | Admitting: Podiatry

## 2022-04-05 ENCOUNTER — Ambulatory Visit: Payer: Medicare Other | Admitting: Physical Therapy

## 2022-04-05 ENCOUNTER — Ambulatory Visit (INDEPENDENT_AMBULATORY_CARE_PROVIDER_SITE_OTHER): Payer: Medicare Other | Admitting: Podiatry

## 2022-04-05 DIAGNOSIS — R293 Abnormal posture: Secondary | ICD-10-CM | POA: Insufficient documentation

## 2022-04-05 DIAGNOSIS — M79676 Pain in unspecified toe(s): Secondary | ICD-10-CM | POA: Diagnosis not present

## 2022-04-05 DIAGNOSIS — B351 Tinea unguium: Secondary | ICD-10-CM

## 2022-04-05 DIAGNOSIS — M6281 Muscle weakness (generalized): Secondary | ICD-10-CM | POA: Insufficient documentation

## 2022-04-05 DIAGNOSIS — R2689 Other abnormalities of gait and mobility: Secondary | ICD-10-CM | POA: Insufficient documentation

## 2022-04-05 DIAGNOSIS — Z9181 History of falling: Secondary | ICD-10-CM | POA: Insufficient documentation

## 2022-04-05 DIAGNOSIS — R262 Difficulty in walking, not elsewhere classified: Secondary | ICD-10-CM | POA: Insufficient documentation

## 2022-04-05 NOTE — Progress Notes (Signed)
   Chief Complaint  Patient presents with   nail trim    foot care    Patient is here for routine foot care and the patient has a question for the provider about a spot on the left foot.    SUBJECTIVE Patient presents to office today complaining of elongated, thickened nails that cause pain while ambulating in shoes.  Patient is unable to trim their own nails. Patient is here for further evaluation and treatment.  Past Medical History:  Diagnosis Date   Asthma    Breast cancer ([place]) 2003   LT LUMPECTOMY   Breast cancer ([place]) 2004   LT LUMPECTOMY   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   GERD (gastroesophageal reflux disease)    Hemorrhoids    Hypertension    Personal history of chemotherapy 2004   BREAST CA   Personal history of radiation therapy 2003   BREAST CA   Personal history of radiation therapy 2004   BREAST CA   Polyp of colon 2002   bleeding polyp of right ascending colon   PUD (peptic ulcer disease)    Seizures ([place])    epilepsy well controlled    OBJECTIVE General Patient is awake, alert, and oriented x 3 and in no acute distress. Derm Skin is dry and supple bilateral. Negative open lesions or macerations. Remaining integument unremarkable. Nails are tender, long, thickened and dystrophic with subungual debris, consistent with onychomycosis, 1-5 bilateral. No signs of infection noted. Vasc  DP and PT pedal pulses palpable bilaterally. Temperature gradient within normal limits.  Neuro Epicritic and protective threshold sensation grossly intact bilaterally.  Musculoskeletal Exam No symptomatic pedal deformities noted bilateral. Muscular strength within normal limits.  ASSESSMENT 1.  Pain due to onychomycosis of toenails both  PLAN OF CARE 1. Patient evaluated today.  2. Instructed to maintain good pedal hygiene and foot care.  3. Mechanical debridement of nails 1-5 bilaterally performed using a nail nipper. Filed with dremel without incident.  4.  Return to clinic in 3 mos.    Edrick Kins, DPM [facility]  Dr. Edrick Kins, DPM    [address], [place] [address]                Office [phone]  Fax [phone]

## 2022-04-12 ENCOUNTER — Other Ambulatory Visit: Payer: Self-pay

## 2022-04-12 ENCOUNTER — Ambulatory Visit: Payer: Medicare Other | Admitting: Physical Therapy

## 2022-04-12 MED ORDER — COVID-19 MRNA VAC-TRIS(PFIZER) 30 MCG/0.3ML IM SUSY
PREFILLED_SYRINGE | INTRAMUSCULAR | 0 refills | Status: DC
  Filled 2022-04-19 – 2022-05-31 (×3): qty 0.3, 1d supply, fill #0

## 2022-04-16 ENCOUNTER — Encounter (HOSPITAL_COMMUNITY): Payer: Self-pay

## 2022-04-17 ENCOUNTER — Encounter: Payer: Medicare Other | Admitting: Physical Therapy

## 2022-04-18 ENCOUNTER — Encounter: Payer: Medicare Other | Admitting: Physical Therapy

## 2022-04-19 ENCOUNTER — Other Ambulatory Visit: Payer: Self-pay

## 2022-04-22 ENCOUNTER — Other Ambulatory Visit: Payer: Self-pay

## 2022-04-22 MED ORDER — INFLUENZA VAC A&B SA ADJ QUAD 0.5 ML IM PRSY
PREFILLED_SYRINGE | INTRAMUSCULAR | 0 refills | Status: DC
  Filled 2022-04-22: qty 0.5, 1d supply, fill #0
  Filled ????-??-??: fill #0

## 2022-04-23 ENCOUNTER — Other Ambulatory Visit: Payer: Self-pay

## 2022-04-29 ENCOUNTER — Ambulatory Visit: Payer: Medicare Other | Admitting: Oncology

## 2022-04-29 ENCOUNTER — Ambulatory Visit: Payer: Medicare Other

## 2022-04-29 ENCOUNTER — Other Ambulatory Visit: Payer: Medicare Other

## 2022-04-30 ENCOUNTER — Encounter: Payer: Medicare Other | Admitting: Physical Therapy

## 2022-05-01 NOTE — Therapy (Signed)
OUTPATIENT PHYSICAL THERAPY Discharge Summary    Patient Name: Rebecca Wolf MRN: [id number] DOB:1925-03-17, 88 y.o., female Today's Date: 05/01/2022    Past Medical History:  Diagnosis Date   Asthma    Breast cancer ([place]) 2003   LT LUMPECTOMY   Breast cancer ([place]) 2004   LT LUMPECTOMY   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   GERD (gastroesophageal reflux disease)    Hemorrhoids    Hypertension    Personal history of chemotherapy 2004   BREAST CA   Personal history of radiation therapy 2003   BREAST CA   Personal history of radiation therapy 2004   BREAST CA   Polyp of colon 2002   bleeding polyp of right ascending colon   PUD (peptic ulcer disease)    Seizures ([place])    epilepsy well controlled   Past Surgical History:  Procedure Laterality Date   BREAST EXCISIONAL BIOPSY Left 2003   positive   BREAST EXCISIONAL BIOPSY Left 2004   positive   BREAST LUMPECTOMY Left 2003   BREAST CA   BREAST LUMPECTOMY Left 2004   BREAST CA   EMBOLIZATION N/A 01/15/2022   Procedure: EMBOLIZATION;  Surgeon: Katha Cabal, MD;  Location: [facility];  Service: Cardiovascular;  Laterality: N/A;   HEMICOLECTOMY Right    bowel obstruction   HEMORRHOID SURGERY     THORACOTOMY/LOBECTOMY  1999   carcinoid tumor   TOTAL VAGINAL HYSTERECTOMY     VARICOSE VEIN SURGERY     Patient Active Problem List   Diagnosis Date Noted   Acute blood loss anemia 01/16/2022   Overweight (BMI 25.0-29.9) 14/23/9532   Chronic systolic CHF (congestive heart failure) ([place]) 01/16/2022   Hypomagnesemia    Leukocytosis    GI bleeding 01/15/2022   GIB (gastrointestinal bleeding) 01/15/2022   Pressure injury of skin 01/15/2022   B12 deficiency 10/17/2019   Folate deficiency 10/17/2019   Anemia 08/29/2019   Iron deficiency anemia 08/26/2019   Chronic anticoagulation 10/25/2018   CKD stage G3b/A1, GFR 30-44 and albumin creatinine ratio <30 mg/g (HCC) 10/05/2018   MGUS (monoclonal  gammopathy of unknown significance) 07/23/2018   Osteopenia after menopause 07/16/2018   Diabetes mellitus with no complication ([place]) 02/33/4356   Vitamin D deficiency 04/07/2018   Heart palpitations 01/06/2018   Tachycardia 01/06/2018   Cystocele with rectocele 10/30/2017   Rectocele 10/30/2017   Microcytic red blood cells 09/23/2017   Localized edema 02/13/2017   Hemorrhoids 05/17/2016   Obesity (BMI 30-39.9) 04/26/2016   Multiple lung nodules 04/19/2016   Chest pain 04/17/2016   Partial symptomatic epilepsy with complex partial seizures, not intractable, without status epilepticus ([place]) 06/30/2015   Bronchiectasis without complication ([place]) 86/16/8372   Imbalance 11/08/2014   Hypertension 11/08/2014   Personal history of malignant neoplasm of breast 11/08/2014   History of malignant carcinoid tumor of bronchus and lung 11/08/2014   H/O peptic ulcer 11/08/2014   Asthma, mild intermittent 11/08/2014   Pulmonary embolism ([place]) 11/08/2014   Gonalgia 11/08/2014   Seizure disorder ([place]) 11/08/2014   Leg varices 11/08/2014    PCP: Kimel-Scott   REFERRING PROVIDER:Kimel-Scott   REFERRING DIAG: Anal Prolapse   Rationale for Evaluation and Treatment Rehabilitation  THERAPY DIAG:  Abnormal posture  Other abnormalities of gait and mobility  Difficulty in walking, not elsewhere classified  Muscle weakness (generalized)  ONSET DATE:  10 years  PERTINENT HISTORY:    GI hermorrhage, anal prolapse and fecal leakage has improved which dtr thinks is related to medication Oxybutin, 3 vaginal deliveries and hysterectomy 2002.  Medical updates:   ED and hospitalization 7/18 to 01/20/22 due to bleeding from mouth ( vomitting and anally).  Her medication had changed to stop GI hemorrhage. Pt received blood  transfusions.  A procedure was done that down   Pt had a fall on 03/14/22 when transferring herself from sofa to bedside commode and hit her R hip. No injury and not medical attention.     CLINICAL IMPRESSION:   Pt completed 2 visits and dtr called to cancel remaining appts.  Across the past 2 visits, pt achieved 3/6 goals.    Provided education ( verbal explanation and research articles) on the importance of getting sleep study given pt's risk factors for OSA which is related to nocturia,  pt's primary complaint.  Pt gets up every 2-3 hours  with the urge to  pee during the night   Pt was tested for sleep apnea more than 10  years and she was positive. Pt tried wearing a CPAP machine but she was not comfortable. Pt and dtr were explained the difference between nocturia and daytime urinary frequency.     Eval note and a voice message was left at PCP's office on the day of evaluation 03/22/22 re: this request for sleep study.    Dtr and pt was educated on exercises for minimizing thoracic kyphosis which impacts IAP system for prolapse. Pt and dtr were explained further assessment of pelvic floor will be conducted on 3rd visit but dtr cancelled further appts.    Pt and dtr has self-d/c.            OBJECTIVE IMPAIRMENTS decreased activity tolerance, decreased coordination, decreased endurance, decreased mobility, difficulty walking, decreased ROM, decreased strength, decreased safety awareness, hypomobility, increased muscle spasms, impaired flexibility, improper body mechanics, postural dysfunction,   ACTIVITY LIMITATIONS  self-care,  sleep,    PARTICIPATION LIMITATIONS:  community,   PERSONAL FACTORS   Past Hx of OSA and noncompliance to CPAP are also affecting patient's functional outcome.    REHAB POTENTIAL: Good   CLINICAL DECISION MAKING: Evolving/moderate complexity   EVALUATION COMPLEXITY: Moderate    PATIENT EDUCATION:    Education details: Showed pt anatomy images.  Explained muscles attachments/ connection, physiology of deep core system/ spinal- thoracic-pelvis-lower kinetic chain as they relate to pt's presentation, Sx, and past Hx. Explained what and how these areas of deficits need to be restored to balance and function    See Therapeutic activity / neuromuscular re-education section  Answered pt's questions.   Person educated: Patient Education method: Explanation, Demonstration, Tactile cues, Verbal cues, and Handouts Education comprehension: verbalized understanding, returned demonstration, verbal cues required, tactile cues required, and needs further education     PLAN: PT FREQUENCY: 1x/week   PT DURATION: 10 weeks   PLANNED INTERVENTIONS: Therapeutic exercises, Therapeutic activity, Neuromuscular re-education, Balance training, Gait training, Patient/Family education, Self Care, Joint mobilization, Spinal mobilization, Moist heat, Taping, and Manual therapy.   PLAN FOR NEXT SESSION: See clinical impression for plan     GOALS: Goals reviewed with patient? Yes  SHORT TERM GOALS: Target date: 04/19/2022    Pt will demo IND with HEP                    Baseline: Not IND            Goal status:  Partially met    LONG TERM GOALS: Target date: 05/31/2022    1.Pt will demo proper deep core coordination without chest breathing and optimal excursion of diaphragm/pelvic floor in order to promote spinal stability and pelvic floor function  Baseline: dyscoordination Goal status:  partially met   2.  Pt will demo > 5 pt change on FOTO  to improve QOL and function ( plan to assess at next session)    Pelvic Pain baseline - PFDI Urinary baseline - Bowel  constipation baseline - Bowel Leakage baseline - Urinary Problem baseline- PFDI Bowel -    Goal status:Not assessed   3.  Pt will demo proper body mechanics in against gravity tasks and ADLs  work tasks, fitness  to minimize straining pelvic floor / back                   Baseline: not IND, improper form that places strain on pelvic floor                Goal status: Met ( dtr was educate do proper body mechanics to assist pt for sit stand t/f )     4. Pt will be IND with scapular/ thoracic extension HEP to minimize thoracic kyphosis to improve IAP system and deep core / pelvic function Baseline:  Goal status: MET     5. Pt will undergo updated sleep study to rule in/out OSA given the association with nocturia with OSA  Baseline:  Pt was Dx with OSA more than 10 years ago but was non compliant with CPAP.  Goal status: MET ( phone message was left on eval appt and noted in eval note. Communicated with with pt across both appts with communication with MD for sleep study)    6. Pt's dtr will demo proper body mechanics for t/f technique to allow for pt to participate more , with use of gait belt and for dtr to minimize back injuries  Baseline:  limited knowledge for proper t/f  Goal status: MET     Jerl Mina, PT 05/01/2022, 10:01 AM

## 2022-05-06 ENCOUNTER — Other Ambulatory Visit: Payer: Self-pay

## 2022-05-10 ENCOUNTER — Other Ambulatory Visit: Payer: Self-pay

## 2022-05-30 ENCOUNTER — Other Ambulatory Visit: Payer: Self-pay

## 2022-05-31 ENCOUNTER — Other Ambulatory Visit: Payer: Self-pay

## 2022-07-01 ENCOUNTER — Other Ambulatory Visit: Payer: Self-pay

## 2022-07-01 ENCOUNTER — Emergency Department: Payer: Medicare Other

## 2022-07-01 ENCOUNTER — Encounter: Payer: Self-pay | Admitting: Emergency Medicine

## 2022-07-01 ENCOUNTER — Emergency Department
Admission: EM | Admit: 2022-07-01 | Discharge: 2022-07-01 | Disposition: A | Discharge status: Home / Self Care | Payer: Medicare Other | Attending: Emergency Medicine | Admitting: Emergency Medicine

## 2022-07-01 DIAGNOSIS — Z7901 Long term (current) use of anticoagulants: Secondary | ICD-10-CM | POA: Diagnosis not present

## 2022-07-01 DIAGNOSIS — R600 Localized edema: Secondary | ICD-10-CM | POA: Insufficient documentation

## 2022-07-01 DIAGNOSIS — Z853 Personal history of malignant neoplasm of breast: Secondary | ICD-10-CM | POA: Diagnosis not present

## 2022-07-01 DIAGNOSIS — I509 Heart failure, unspecified: Secondary | ICD-10-CM | POA: Diagnosis not present

## 2022-07-01 DIAGNOSIS — D649 Anemia, unspecified: Secondary | ICD-10-CM | POA: Insufficient documentation

## 2022-07-01 DIAGNOSIS — R7989 Other specified abnormal findings of blood chemistry: Secondary | ICD-10-CM | POA: Diagnosis not present

## 2022-07-01 DIAGNOSIS — R531 Weakness: Secondary | ICD-10-CM | POA: Diagnosis present

## 2022-07-01 LAB — HEPATIC FUNCTION PANEL
ALT: 12 U/L (ref 0–44)
AST: 16 U/L (ref 15–41)
Albumin: 3.3 g/dL — ABNORMAL LOW (ref 3.5–5.0)
Alkaline Phosphatase: 94 U/L (ref 38–126)
Bilirubin, Direct: <0.1 mg/dL (ref 0.0–0.2)
Total Bilirubin: 0.8 mg/dL (ref 0.3–1.2)
Total Protein: 6.9 g/dL (ref 6.5–8.1)

## 2022-07-01 LAB — BRAIN NATRIURETIC PEPTIDE: B Natriuretic Peptide: 219.4 pg/mL — ABNORMAL HIGH (ref 0.0–100.0)

## 2022-07-01 LAB — CBC
HCT: 34.4 % — ABNORMAL LOW (ref 36.0–46.0)
Hemoglobin: 10.2 g/dL — ABNORMAL LOW (ref 12.0–15.0)
MCH: 24.6 pg — ABNORMAL LOW (ref 26.0–34.0)
MCHC: 29.7 g/dL — ABNORMAL LOW (ref 30.0–36.0)
MCV: 83.1 fL (ref 80.0–100.0)
Platelets: 282 10*3/uL (ref 150–400)
RBC: 4.14 MIL/uL (ref 3.87–5.11)
RDW: 17.2 % — ABNORMAL HIGH (ref 11.5–15.5)
WBC: 7.9 10*3/uL (ref 4.0–10.5)
nRBC: 0 % (ref 0.0–0.2)

## 2022-07-01 LAB — BASIC METABOLIC PANEL
Anion gap: 7 (ref 5–15)
BUN: 18 mg/dL (ref 8–23)
CO2: 27 mmol/L (ref 22–32)
Calcium: 8.9 mg/dL (ref 8.9–10.3)
Chloride: 107 mmol/L (ref 98–111)
Creatinine, Ser: 1.02 mg/dL — ABNORMAL HIGH (ref 0.44–1.00)
GFR, Estimated: 51 mL/min — ABNORMAL LOW (ref >60)
Glucose, Bld: 134 mg/dL — ABNORMAL HIGH (ref 70–99)
Potassium: 4.1 mmol/L (ref 3.5–5.1)
Sodium: 141 mmol/L (ref 135–145)

## 2022-07-01 LAB — PROTIME-INR
INR: 1.4 — ABNORMAL HIGH (ref 0.8–1.2)
Prothrombin Time: 16.7 seconds — ABNORMAL HIGH (ref 11.4–15.2)

## 2022-07-01 LAB — TROPONIN I (HIGH SENSITIVITY): Troponin I (High Sensitivity): 11 ng/L (ref <18)

## 2022-07-01 MED ORDER — FUROSEMIDE 20 MG PO TABS: 20.0000 mg | ORAL_TABLET | Freq: Two times a day (BID) | ORAL | 0 refills | Status: DC

## 2022-07-01 MED ORDER — FUROSEMIDE 10 MG/ML IJ SOLN
40.0000 mg | Freq: Once | INTRAMUSCULAR | Status: AC
  Administered 2022-07-01: 40 mg via INTRAVENOUS
  Filled 2022-07-01: qty 4

## 2022-07-01 MED ORDER — POTASSIUM CHLORIDE CRYS ER 20 MEQ PO TBCR: 20.0000 meq | EXTENDED_RELEASE_TABLET | Freq: Two times a day (BID) | ORAL | 0 refills | Status: DC

## 2022-07-01 NOTE — Discharge Instructions (Signed)
As we discussed, start taking the Lasix 20 mg twice a day with potassium.  Please keep an eye on blood pressure and do not take if it is persistently less than 023 systolic.  Stop immediately if you begin to get lightheaded, particularly with standing.  Call your primary care doctor and get follow-up within the next 3 to 5 days for repeat lab work and checkup.

## 2022-07-01 NOTE — ED Provider Notes (Signed)
[facility] Provider Note    Event Date/Time   First MD Initiated Contact with Patient 07/01/22 1006     (approximate)   History   Leg Swelling and Weakness   HPI  Rebecca Wolf is a 89 y.o. female   with past medical history of breast cancer, history of DVT, reported history of congestive heart failure here with bilateral leg swelling.  Patient presents with granddaughter who helps take care of her.  Per report, she has had increasing and persistent lower extremity edema over the last several weeks.  She had been given 3 days of Lasix last week without significant improvement.  She has not persistent edema.  She also recently decreased her anticoagulant from 10 to 5 mg so family was concerned about possible blood clot.  She has a PCP appointment this week.  She has had some cough and shortness of breath which she thought was due to a URI.  No fevers.  No significant sputum production.  She has had some occasional light sputum production.  No hemoptysis.  No other complaints.      Physical Exam   Triage Vital Signs: ED Triage Vitals  Enc Vitals Group     BP --      Pulse --      Resp --      Temp --      Temp src --      SpO2 --      Weight 07/01/22 0746 158 lb 11.7 oz (72 kg)     Height 07/01/22 0746 '5\' 4"'$  (1.626 m)     Head Circumference --      Peak Flow --      Pain Score 07/01/22 0745 5     Pain Loc --      Pain Edu? --      Excl. in [place]? --     Most recent vital signs: Vitals:   07/01/22 1232 07/01/22 1253  BP: 137/81 130/80  Pulse: (!) 109 99  Resp: 18 18  SpO2: 97% 98%     General: Awake, no distress.  CV:  Good peripheral perfusion.  Regular rate and rhythm. Resp:  Normal effort.  Lungs with bilateral minimal rales.  Normal work of breathing.  Speaking in full sentences. Abd:  No distention.  No tenderness Other:  2+ bilateral edema bilateral legs with chronic venous stasis changes.   ED Results / Procedures / Treatments    Labs (all labs ordered are listed, but only abnormal results are displayed) Labs Reviewed  PROTIME-INR - Abnormal; Notable for the following components:      Result Value   Prothrombin Time 16.7 (*)    INR 1.4 (*)    All other components within normal limits  CBC - Abnormal; Notable for the following components:   Hemoglobin 10.2 (*)    HCT 34.4 (*)    MCH 24.6 (*)    MCHC 29.7 (*)    RDW 17.2 (*)    All other components within normal limits  BASIC METABOLIC PANEL - Abnormal; Notable for the following components:   Glucose, Bld 134 (*)    Creatinine, Ser 1.02 (*)    GFR, Estimated 51 (*)    All other components within normal limits  HEPATIC FUNCTION PANEL - Abnormal; Notable for the following components:   Albumin 3.3 (*)    All other components within normal limits  BRAIN NATRIURETIC PEPTIDE - Abnormal; Notable for the following components:  B Natriuretic Peptide 219.4 (*)    All other components within normal limits  TROPONIN I (HIGH SENSITIVITY)     EKG    RADIOLOGY Ultrasound venous bilateral: Negative Chest x-ray: Mild interstitial edema   I also independently reviewed and agree with radiologist interpretations.   PROCEDURES:  Critical Care performed: No   MEDICATIONS ORDERED IN ED: Medications  furosemide (LASIX) injection 40 mg (40 mg Intravenous Given 07/01/22 1307)     IMPRESSION / MDM / ASSESSMENT AND PLAN / ED COURSE  I reviewed the triage vital signs and the nursing notes.                              Differential diagnosis includes, but is not limited to, CHF exacerbation, anasarca, liver failure, renal failure, DVT, lymphedema  Patient's presentation is most consistent with acute presentation with potential threat to life or bodily function.  The patient is on the cardiac monitor to evaluate for evidence of arrhythmia and/or significant heart rate changes  89 year old female here with cough, bilateral lower extremity edema.  Family  concerned about DVT given her history is ultrasounds were obtained and fortunately are negative.  Chest x-ray shows some mild edema and BNP is elevated to 19.  She does appear to be hypervolemic.  CBC unremarkable with baseline mild anemia.  BMP shows normal renal function.  She has no hypoxia or symptoms to suggest PE.  I discussed findings and recommended admission for IV Lasix and monitoring to family.  Patient would very much like to return home.  She has a granddaughter who lives with her and can take care of her 24 hours a day.  Given her age and likelihood of delirium and decompensation with hospitalization, feel this is a reasonable option.  She was given a dose of IV Lasix and we will increase to 20 mg twice daily with potassium for the next 5 days.  She will follow-up with her PCP in the next several days.  Return precautions given.     FINAL CLINICAL IMPRESSION(S) / ED DIAGNOSES   Final diagnoses:  Acute on chronic congestive heart failure, unspecified heart failure type ([place])     Rx / DC Orders   ED Discharge Orders          Ordered    furosemide (LASIX) 20 MG tablet  2 times daily        07/01/22 1302    potassium chloride SA (KLOR-CON M) 20 MEQ tablet  2 times daily        07/01/22 1302             Note:  This document was prepared using Dragon voice recognition software and may include unintentional dictation errors.   Duffy Bruce, MD 07/01/22 1308

## 2022-07-01 NOTE — ED Triage Notes (Signed)
Pt in via EMS from home with lower extremity edema. Pt was put on lasix but the swelling has not went down. Pt ont as ambulatory as she usually is because of her legs. Pt also has hx of DVT and takes eloquis.   96% RA, 18/74, pt has not taken meds yet today.

## 2022-07-01 NOTE — ED Triage Notes (Signed)
Pt reports swelling in her legs. Pt states always has swelling in her legs but lately her left leg has been buckling on her and she is concerned she may have a blood clot in it. Pt reports has has clots before and takes eloquis for them. Pt denies SOB, generalized weakness, CP or other concerns beside her left leg.

## 2022-07-01 NOTE — ED Notes (Signed)
Patient placed on Purewick.

## 2022-07-09 ENCOUNTER — Ambulatory Visit: Payer: Medicare Other | Admitting: Podiatry

## 2022-07-16 ENCOUNTER — Encounter: Payer: Self-pay | Admitting: Nurse Practitioner

## 2022-07-19 ENCOUNTER — Encounter: Payer: Medicare Other | Admitting: Family

## 2022-07-31 ENCOUNTER — Encounter: Payer: Self-pay | Admitting: Nurse Practitioner

## 2022-08-01 ENCOUNTER — Encounter: Payer: Self-pay | Admitting: Family

## 2022-08-13 ENCOUNTER — Ambulatory Visit: Payer: Medicare Other | Admitting: Podiatry

## 2022-08-27 ENCOUNTER — Other Ambulatory Visit: Payer: Self-pay

## 2022-08-27 ENCOUNTER — Emergency Department
Admission: EM | Admit: 2022-08-27 | Discharge: 2022-08-27 | Disposition: A | Discharge status: Home / Self Care | Payer: 59 | Attending: Emergency Medicine | Admitting: Emergency Medicine

## 2022-08-27 ENCOUNTER — Emergency Department: Payer: 59

## 2022-08-27 DIAGNOSIS — I110 Hypertensive heart disease with heart failure: Secondary | ICD-10-CM | POA: Insufficient documentation

## 2022-08-27 DIAGNOSIS — R0602 Shortness of breath: Secondary | ICD-10-CM

## 2022-08-27 DIAGNOSIS — I482 Chronic atrial fibrillation, unspecified: Secondary | ICD-10-CM | POA: Insufficient documentation

## 2022-08-27 DIAGNOSIS — I5022 Chronic systolic (congestive) heart failure: Secondary | ICD-10-CM

## 2022-08-27 LAB — TROPONIN I (HIGH SENSITIVITY): Troponin I (High Sensitivity): 16 ng/L (ref <18)

## 2022-08-27 LAB — BASIC METABOLIC PANEL
Anion gap: 10 (ref 5–15)
BUN: 13 mg/dL (ref 8–23)
CO2: 23 mmol/L (ref 22–32)
Calcium: 8.4 mg/dL — ABNORMAL LOW (ref 8.9–10.3)
Chloride: 106 mmol/L (ref 98–111)
Creatinine, Ser: 1.1 mg/dL — ABNORMAL HIGH (ref 0.44–1.00)
GFR, Estimated: 46 mL/min — ABNORMAL LOW (ref >60)
Glucose, Bld: 110 mg/dL — ABNORMAL HIGH (ref 70–99)
Potassium: 3.7 mmol/L (ref 3.5–5.1)
Sodium: 139 mmol/L (ref 135–145)

## 2022-08-27 LAB — BRAIN NATRIURETIC PEPTIDE: B Natriuretic Peptide: 390.9 pg/mL — ABNORMAL HIGH (ref 0.0–100.0)

## 2022-08-27 LAB — CBC
HCT: 35 % — ABNORMAL LOW (ref 36.0–46.0)
Hemoglobin: 10.5 g/dL — ABNORMAL LOW (ref 12.0–15.0)
MCH: 23.2 pg — ABNORMAL LOW (ref 26.0–34.0)
MCHC: 30 g/dL (ref 30.0–36.0)
MCV: 77.3 fL — ABNORMAL LOW (ref 80.0–100.0)
Platelets: 215 10*3/uL (ref 150–400)
RBC: 4.53 MIL/uL (ref 3.87–5.11)
RDW: 17.3 % — ABNORMAL HIGH (ref 11.5–15.5)
WBC: 5.8 10*3/uL (ref 4.0–10.5)
nRBC: 0 % (ref 0.0–0.2)

## 2022-08-27 MED ORDER — AMOXICILLIN-POT CLAVULANATE 875-125 MG PO TABS: 1.0000 | ORAL_TABLET | Freq: Two times a day (BID) | ORAL | 0 refills | Status: DC

## 2022-08-27 NOTE — ED Provider Notes (Signed)
[facility] Provider Note    Event Date/Time   First MD Initiated Contact with Patient 08/27/22 1505     (approximate)   History   Chief Complaint: Shortness of Breath   HPI  Rebecca Wolf is a 89 y.o. female with a history of hypertension, seizures, GERD, CHF with EF of 20% who comes to the ED due to shortness of breath starting at about 1:30 PM today.  Lasted for about 30 minutes and then resolved.  She is currently asymptomatic.  Daughter notes that this is in the context of patient reporting that she needed to go urgently to the bathroom to urinate, and sometimes the patient gets agitated during this process and expresses shortness of breath that is momentary.  Otherwise the patient has been in her usual state of health.     Physical Exam   Triage Vital Signs: ED Triage Vitals  Enc Vitals Group     BP 08/27/22 1431 111/66     Pulse Rate 08/27/22 1431 95     Resp 08/27/22 1431 19     Temp 08/27/22 1431 97.6 F (36.4 C)     Temp Source 08/27/22 1431 Oral     SpO2 08/27/22 1431 95 %     Weight 08/27/22 1433 154 lb (69.9 kg)     Height 08/27/22 1433 '5\' 4"'$  (1.626 m)     Head Circumference --      Peak Flow --      Pain Score 08/27/22 1433 0     Pain Loc --      Pain Edu? --      Excl. in [place]? --     Most recent vital signs: Vitals:   08/27/22 1715 08/27/22 1900  BP: 118/70 116/74  Pulse: (!) 38 (!) 44  Resp: 19 17  Temp: 98 F (36.7 C)   SpO2: 98% 98%    General: Awake, no distress.  CV:  Good peripheral perfusion.  Regular rate and rhythm. Resp:  Normal effort.  Good air entry bilaterally.  Slight left basilar crackles. Abd:  No distention.  Soft nontender Other:  Chronic lymphedema bilateral lower extremities.  2+ pitting edema.  Symmetric, no calf tenderness.  At baseline per daughter.   ED Results / Procedures / Treatments   Labs (all labs ordered are listed, but only abnormal results are displayed) Labs Reviewed  BASIC  METABOLIC PANEL - Abnormal; Notable for the following components:      Result Value   Glucose, Bld 110 (*)    Creatinine, Ser 1.10 (*)    Calcium 8.4 (*)    GFR, Estimated 46 (*)    All other components within normal limits  CBC - Abnormal; Notable for the following components:   Hemoglobin 10.5 (*)    HCT 35.0 (*)    MCV 77.3 (*)    MCH 23.2 (*)    RDW 17.3 (*)    All other components within normal limits  BRAIN NATRIURETIC PEPTIDE - Abnormal; Notable for the following components:   B Natriuretic Peptide 390.9 (*)    All other components within normal limits  TROPONIN I (HIGH SENSITIVITY)     EKG Interpreted by me Atrial fibrillation, rate of 76.  Normal axis, prolonged QTc of 533 ms.  First-degree AV block.  Left bundle branch block.  No acute ischemic changes.   RADIOLOGY Chest x-ray interpreted by me, mild haziness at the left base.  Radiology report reviewed suggesting infiltrate at the  left base.  No pulmonary edema.   PROCEDURES:  Procedures   MEDICATIONS ORDERED IN ED: Medications - No data to display   IMPRESSION / MDM / [place] / ED COURSE  I reviewed the triage vital signs and the nursing notes.  DDx: Pleural effusion, pulmonary edema, pneumonia, electrolyte abnormality, AKI, non-STEMI, anxiety/agitation  Patient's presentation is most consistent with acute presentation with potential threat to life or bodily function.  Patient presents with a brief episodes of shortness of breath, no chest pain.  Currently asymptomatic.  Clinically does not appear to be volume overloaded compared to baseline.  She recently started spironolactone in addition to furosemide for diuresis.  Will check labs and chest x-ray.  ----------------------------------------- 8:36 PM on 08/27/2022 ----------------------------------------- Labs are unremarkable.  Chest x-ray shows a small opacity in the base of the left lung which appears new compared to previous.  The  projection is somewhat different as well.  The patient has had a cough, but otherwise is asymptomatic currently with normal oxygen saturation, normal work of breathing.  I doubt that she has a significant pneumonia, but with her age and comorbidities, will cover with Augmentin.  I do not think she needs to be hospitalized.  Patient and family member at bedside are comfortable with discharge.       FINAL CLINICAL IMPRESSION(S) / ED DIAGNOSES   Final diagnoses:  SOB (shortness of breath)  Chronic systolic congestive heart failure (HCC)  Chronic atrial fibrillation ([place])     Rx / DC Orders   ED Discharge Orders          Ordered    amoxicillin-clavulanate (AUGMENTIN) 875-125 MG tablet  2 times daily        08/27/22 2034             Note:  This document was prepared using Dragon voice recognition software and may include unintentional dictation errors.   Carrie Mew, MD 08/27/22 2037

## 2022-08-27 NOTE — ED Triage Notes (Signed)
Pt bib ems accompanied by daughter c/o SOB started 1hr ago. Medication change today-- added Spironolactone today for CHF. Per pt's daughter seen PCP yesterday, cardiology less than 2 weeks ago for CHF and low heart rate. Daughter also states chronic pressure ulcer on sacrum which is getting better and was cleaned today by home health nurse. Denies fever, no chest pain.

## 2022-08-27 NOTE — ED Notes (Signed)
Nurse first note: Pt arrives via EMS from home with SOB. Diagnosed yesterday with CHF. Denies any pain. 140/78 96% RA 89 HR 128 CBG

## 2022-08-30 ENCOUNTER — Emergency Department: Payer: 59

## 2022-08-30 ENCOUNTER — Encounter: Payer: Self-pay | Admitting: Radiology

## 2022-08-30 ENCOUNTER — Other Ambulatory Visit: Payer: Self-pay

## 2022-08-30 ENCOUNTER — Inpatient Hospital Stay
Admission: EM | Admit: 2022-08-30 | Discharge: 2022-09-06 | DRG: 291 | Disposition: A | Discharge status: Skilled Nursing Facility | Payer: 59 | Attending: Osteopathic Medicine | Admitting: Osteopathic Medicine

## 2022-08-30 DIAGNOSIS — I5023 Acute on chronic systolic (congestive) heart failure: Secondary | ICD-10-CM | POA: Diagnosis present

## 2022-08-30 DIAGNOSIS — Z7951 Long term (current) use of inhaled steroids: Secondary | ICD-10-CM

## 2022-08-30 DIAGNOSIS — I480 Paroxysmal atrial fibrillation: Secondary | ICD-10-CM | POA: Diagnosis present

## 2022-08-30 DIAGNOSIS — E1122 Type 2 diabetes mellitus with diabetic chronic kidney disease: Secondary | ICD-10-CM | POA: Diagnosis present

## 2022-08-30 DIAGNOSIS — Z90710 Acquired absence of both cervix and uterus: Secondary | ICD-10-CM

## 2022-08-30 DIAGNOSIS — Z811 Family history of alcohol abuse and dependence: Secondary | ICD-10-CM

## 2022-08-30 DIAGNOSIS — Z794 Long term (current) use of insulin: Secondary | ICD-10-CM

## 2022-08-30 DIAGNOSIS — Z87891 Personal history of nicotine dependence: Secondary | ICD-10-CM

## 2022-08-30 DIAGNOSIS — I509 Heart failure, unspecified: Secondary | ICD-10-CM

## 2022-08-30 DIAGNOSIS — J45909 Unspecified asthma, uncomplicated: Secondary | ICD-10-CM | POA: Diagnosis present

## 2022-08-30 DIAGNOSIS — Z882 Allergy status to sulfonamides status: Secondary | ICD-10-CM

## 2022-08-30 DIAGNOSIS — Z881 Allergy status to other antibiotic agents status: Secondary | ICD-10-CM

## 2022-08-30 DIAGNOSIS — E119 Type 2 diabetes mellitus without complications: Secondary | ICD-10-CM

## 2022-08-30 DIAGNOSIS — Z7901 Long term (current) use of anticoagulants: Secondary | ICD-10-CM

## 2022-08-30 DIAGNOSIS — I440 Atrioventricular block, first degree: Secondary | ICD-10-CM | POA: Diagnosis present

## 2022-08-30 DIAGNOSIS — Z6828 Body mass index (BMI) 28.0-28.9, adult: Secondary | ICD-10-CM

## 2022-08-30 DIAGNOSIS — J189 Pneumonia, unspecified organism: Secondary | ICD-10-CM | POA: Diagnosis present

## 2022-08-30 DIAGNOSIS — Z86711 Personal history of pulmonary embolism: Secondary | ICD-10-CM

## 2022-08-30 DIAGNOSIS — I4721 Torsades de pointes: Secondary | ICD-10-CM | POA: Diagnosis present

## 2022-08-30 DIAGNOSIS — D649 Anemia, unspecified: Secondary | ICD-10-CM | POA: Diagnosis present

## 2022-08-30 DIAGNOSIS — E876 Hypokalemia: Secondary | ICD-10-CM | POA: Diagnosis present

## 2022-08-30 DIAGNOSIS — Z8711 Personal history of peptic ulcer disease: Secondary | ICD-10-CM

## 2022-08-30 DIAGNOSIS — Z86010 Personal history of colonic polyps: Secondary | ICD-10-CM

## 2022-08-30 DIAGNOSIS — I4892 Unspecified atrial flutter: Secondary | ICD-10-CM | POA: Diagnosis present

## 2022-08-30 DIAGNOSIS — I130 Hypertensive heart and chronic kidney disease with heart failure and stage 1 through stage 4 chronic kidney disease, or unspecified chronic kidney disease: Principal | ICD-10-CM | POA: Diagnosis present

## 2022-08-30 DIAGNOSIS — R0602 Shortness of breath: Secondary | ICD-10-CM | POA: Diagnosis not present

## 2022-08-30 DIAGNOSIS — Z9221 Personal history of antineoplastic chemotherapy: Secondary | ICD-10-CM

## 2022-08-30 DIAGNOSIS — Z833 Family history of diabetes mellitus: Secondary | ICD-10-CM

## 2022-08-30 DIAGNOSIS — Z7952 Long term (current) use of systemic steroids: Secondary | ICD-10-CM

## 2022-08-30 DIAGNOSIS — K219 Gastro-esophageal reflux disease without esophagitis: Secondary | ICD-10-CM | POA: Diagnosis present

## 2022-08-30 DIAGNOSIS — I482 Chronic atrial fibrillation, unspecified: Secondary | ICD-10-CM | POA: Diagnosis present

## 2022-08-30 DIAGNOSIS — F419 Anxiety disorder, unspecified: Secondary | ICD-10-CM | POA: Diagnosis present

## 2022-08-30 DIAGNOSIS — R001 Bradycardia, unspecified: Secondary | ICD-10-CM | POA: Diagnosis present

## 2022-08-30 DIAGNOSIS — E440 Moderate protein-calorie malnutrition: Secondary | ICD-10-CM | POA: Diagnosis present

## 2022-08-30 DIAGNOSIS — Z853 Personal history of malignant neoplasm of breast: Secondary | ICD-10-CM

## 2022-08-30 DIAGNOSIS — Z79899 Other long term (current) drug therapy: Secondary | ICD-10-CM

## 2022-08-30 DIAGNOSIS — I2699 Other pulmonary embolism without acute cor pulmonale: Secondary | ICD-10-CM | POA: Diagnosis present

## 2022-08-30 DIAGNOSIS — E878 Other disorders of electrolyte and fluid balance, not elsewhere classified: Secondary | ICD-10-CM | POA: Diagnosis present

## 2022-08-30 DIAGNOSIS — Z923 Personal history of irradiation: Secondary | ICD-10-CM

## 2022-08-30 DIAGNOSIS — I5022 Chronic systolic (congestive) heart failure: Secondary | ICD-10-CM | POA: Diagnosis present

## 2022-08-30 DIAGNOSIS — G40909 Epilepsy, unspecified, not intractable, without status epilepticus: Secondary | ICD-10-CM | POA: Diagnosis present

## 2022-08-30 DIAGNOSIS — N1832 Chronic kidney disease, stage 3b: Secondary | ICD-10-CM | POA: Diagnosis present

## 2022-08-30 DIAGNOSIS — D631 Anemia in chronic kidney disease: Secondary | ICD-10-CM | POA: Diagnosis present

## 2022-08-30 HISTORY — DX: Bronchiectasis, uncomplicated: J47.9

## 2022-08-30 LAB — COMPREHENSIVE METABOLIC PANEL
ALT: 15 U/L (ref 0–44)
AST: 22 U/L (ref 15–41)
Albumin: 2.8 g/dL — ABNORMAL LOW (ref 3.5–5.0)
Alkaline Phosphatase: 135 U/L — ABNORMAL HIGH (ref 38–126)
Anion gap: 9 (ref 5–15)
BUN: 13 mg/dL (ref 8–23)
CO2: 23 mmol/L (ref 22–32)
Calcium: 8.7 mg/dL — ABNORMAL LOW (ref 8.9–10.3)
Chloride: 108 mmol/L (ref 98–111)
Creatinine, Ser: 0.95 mg/dL (ref 0.44–1.00)
GFR, Estimated: 55 mL/min — ABNORMAL LOW (ref >60)
Glucose, Bld: 157 mg/dL — ABNORMAL HIGH (ref 70–99)
Potassium: 4.7 mmol/L (ref 3.5–5.1)
Sodium: 140 mmol/L (ref 135–145)
Total Bilirubin: 0.4 mg/dL (ref 0.3–1.2)
Total Protein: 6.8 g/dL (ref 6.5–8.1)

## 2022-08-30 LAB — URINALYSIS, ROUTINE W REFLEX MICROSCOPIC
Bacteria, UA: NONE SEEN
Bilirubin Urine: NEGATIVE
Glucose, UA: NEGATIVE mg/dL
Hgb urine dipstick: NEGATIVE
Ketones, ur: NEGATIVE mg/dL
Leukocytes,Ua: NEGATIVE
Nitrite: NEGATIVE
Protein, ur: NEGATIVE mg/dL
Specific Gravity, Urine: 1.034 — ABNORMAL HIGH (ref 1.005–1.030)
WBC, UA: NONE SEEN WBC/hpf (ref 0–5)
pH: 5 (ref 5.0–8.0)

## 2022-08-30 LAB — CBC
HCT: 35.7 % — ABNORMAL LOW (ref 36.0–46.0)
Hemoglobin: 10.8 g/dL — ABNORMAL LOW (ref 12.0–15.0)
MCH: 23.3 pg — ABNORMAL LOW (ref 26.0–34.0)
MCHC: 30.3 g/dL (ref 30.0–36.0)
MCV: 76.9 fL — ABNORMAL LOW (ref 80.0–100.0)
Platelets: 248 10*3/uL (ref 150–400)
RBC: 4.64 MIL/uL (ref 3.87–5.11)
RDW: 17.5 % — ABNORMAL HIGH (ref 11.5–15.5)
WBC: 5.3 10*3/uL (ref 4.0–10.5)
nRBC: 0 % (ref 0.0–0.2)

## 2022-08-30 LAB — GLUCOSE, CAPILLARY: Glucose-Capillary: 201 mg/dL — ABNORMAL HIGH (ref 70–99)

## 2022-08-30 LAB — BRAIN NATRIURETIC PEPTIDE: B Natriuretic Peptide: 553.4 pg/mL — ABNORMAL HIGH (ref 0.0–100.0)

## 2022-08-30 MED ORDER — APIXABAN 2.5 MG PO TABS
2.5000 mg | ORAL_TABLET | Freq: Two times a day (BID) | ORAL | Status: DC
  Administered 2022-08-30 – 2022-09-06 (×14): 2.5 mg via ORAL
  Filled 2022-08-30 (×14): qty 1

## 2022-08-30 MED ORDER — LEVETIRACETAM 250 MG PO TABS
375.0000 mg | ORAL_TABLET | Freq: Two times a day (BID) | ORAL | Status: DC
  Administered 2022-08-30 – 2022-09-01 (×4): 375 mg via ORAL
  Filled 2022-08-30 (×5): qty 1.5

## 2022-08-30 MED ORDER — POLYETHYLENE GLYCOL 3350 17 G PO PACK: 17.0000 g | PACK | Freq: Every day | ORAL | Status: DC | PRN

## 2022-08-30 MED ORDER — AMIODARONE HCL 200 MG PO TABS
200.0000 mg | ORAL_TABLET | Freq: Every day | ORAL | Status: DC
  Filled 2022-08-30 (×2): qty 1

## 2022-08-30 MED ORDER — MONTELUKAST SODIUM 10 MG PO TABS
10.0000 mg | ORAL_TABLET | Freq: Every day | ORAL | Status: DC
  Administered 2022-08-30 – 2022-09-05 (×7): 10 mg via ORAL
  Filled 2022-08-30 (×7): qty 1

## 2022-08-30 MED ORDER — BISACODYL 5 MG PO TBEC: 5.0000 mg | DELAYED_RELEASE_TABLET | Freq: Every day | ORAL | Status: DC | PRN

## 2022-08-30 MED ORDER — IOHEXOL 350 MG/ML SOLN
75.0000 mL | Freq: Once | INTRAVENOUS | Status: AC | PRN
  Administered 2022-08-30: 75 mL via INTRAVENOUS

## 2022-08-30 MED ORDER — HYDRALAZINE HCL 20 MG/ML IJ SOLN: 5.0000 mg | INTRAMUSCULAR | Status: DC | PRN

## 2022-08-30 MED ORDER — HEPARIN SODIUM (PORCINE) 5000 UNIT/ML IJ SOLN: 5000.0000 [IU] | Freq: Three times a day (TID) | INTRAMUSCULAR | Status: DC

## 2022-08-30 MED ORDER — FUROSEMIDE 10 MG/ML IJ SOLN
20.0000 mg | Freq: Two times a day (BID) | INTRAMUSCULAR | Status: DC
  Administered 2022-08-31 – 2022-09-03 (×6): 20 mg via INTRAVENOUS
  Filled 2022-08-30 (×7): qty 2

## 2022-08-30 MED ORDER — FUROSEMIDE 10 MG/ML IJ SOLN
60.0000 mg | Freq: Once | INTRAMUSCULAR | Status: AC
  Administered 2022-08-30: 60 mg via INTRAVENOUS
  Filled 2022-08-30: qty 8

## 2022-08-30 MED ORDER — INSULIN ASPART 100 UNIT/ML IJ SOLN: 0.0000 [IU] | Freq: Every day | INTRAMUSCULAR | Status: DC

## 2022-08-30 MED ORDER — PANTOPRAZOLE SODIUM 40 MG PO TBEC
40.0000 mg | DELAYED_RELEASE_TABLET | Freq: Every day | ORAL | Status: DC
  Administered 2022-08-30 – 2022-09-06 (×8): 40 mg via ORAL
  Filled 2022-08-30 (×8): qty 1

## 2022-08-30 MED ORDER — FLUTICASONE PROPIONATE 50 MCG/ACT NA SUSP
2.0000 | Freq: Every day | NASAL | Status: DC
  Administered 2022-08-31 – 2022-09-06 (×6): 2 via NASAL
  Filled 2022-08-30 (×2): qty 16

## 2022-08-30 MED ORDER — SODIUM CHLORIDE 0.9% FLUSH
3.0000 mL | Freq: Two times a day (BID) | INTRAVENOUS | Status: DC
  Administered 2022-08-30 – 2022-09-06 (×14): 3 mL via INTRAVENOUS

## 2022-08-30 MED ORDER — INSULIN ASPART 100 UNIT/ML IJ SOLN
0.0000 [IU] | Freq: Three times a day (TID) | INTRAMUSCULAR | Status: DC
  Administered 2022-09-02: 1 [IU] via SUBCUTANEOUS
  Administered 2022-09-03: 2 [IU] via SUBCUTANEOUS
  Administered 2022-09-04 – 2022-09-05 (×3): 1 [IU] via SUBCUTANEOUS
  Administered 2022-09-06: 2 [IU] via SUBCUTANEOUS
  Filled 2022-08-30 (×7): qty 1

## 2022-08-30 MED ORDER — ACETAMINOPHEN 650 MG RE SUPP: 650.0000 mg | Freq: Four times a day (QID) | RECTAL | Status: DC | PRN

## 2022-08-30 MED ORDER — ACETAMINOPHEN 325 MG PO TABS
650.0000 mg | ORAL_TABLET | Freq: Four times a day (QID) | ORAL | Status: DC | PRN
  Administered 2022-08-31 – 2022-09-05 (×3): 650 mg via ORAL
  Filled 2022-08-30 (×3): qty 2

## 2022-08-30 NOTE — Hospital Course (Addendum)
89 years old female with medical history of hypertension, seizure disorder, CHF with EF of 20% and GERD Presented to the ED 08/30/2022 with complaint of shortness of breath.  Patient reports shortness of breath lasted about 30 minutes and then resolved.  Patient was asymptomatic on presentation in the ED.  As per daughter patient momentarily gets short of breath when she has to use the restroom  03/01: admitted to hospitalist service for CHF 03/02: bradycardic on amiodarone 200 mg daily.    03/03: patient had an episode of seizure.  This was witnessed by patient's daughter in the room.  According to patient's daughter, neurologist was weaning patient off  Keppra.  Patient seen by neurologist who recommended going back on patient's previous Keppra dose of 750 twice daily.  03/04: stable, PT/OT recs for SNF  03/05: plan for SNF, auth pending, TOC working w/ family on placement, see notes. Cardiology team has cleared for discharge, to follow in office w/ Dr Clayborn Bigness in 1-2 weeks.  03/06-03/07: SNF placement pending auth 03/08: SNF approval, clear for discharge   Consultants:  Cardiology  Neurology   Procedures: none  Pertinent results:  Most recent 2D Echo shows EF of 15% at [place] C/W reduced ejection fraction heart failure     ASSESSMENT & PLAN:   Principal Problem:   SOB (shortness of breath) Active Problems:   Chronic systolic CHF (congestive heart failure) (HCC)   Seizure disorder (HCC)   Pulmonary embolism (HCC)   CKD stage G3b/A1, GFR 30-44 and albumin creatinine ratio <30 mg/g (HCC)   Diabetes mellitus with no complication (HCC)   Anemia   Electrolyte abnormality   Acute on chronic HFrEF (heart failure with reduced ejection fraction) (HCC)   Malnutrition of moderate degree  Acute on chronic HFrEF Moderate to severe MR Shortness of breath d/t above  Clinical improvement following IV diuresis with IV Lasix twice daily.  Was previously taking 20 mg of p.o. Lasix daily  at home restart 40 mg p.o. Lasix once daily 09/04/22  GDMT with losartan 25 mg daily and spironolactone 25 mg once daily no BB with baseline bradycardia Consider SGLT2i as renal function allows No further cardiac diagnostics necessary ok for discharge from a cardiac perspective per note 03/05 Monitor BMP outpatient   Community-acquired pneumonia, present on admission SOB d/t above Chest x-ray obtained showed new infiltrates on the right side Continue ceftriaxone and azithromycin while inpatient and transition on po abx on discharge if still needing more days to complete 5-7 days total --> augmentin + azithro   Chronic atrial flutter Rate controlled on telemetry CHA2DS2-VASc 7 (age, sex, hx VTE, CHF, HTN) Continue amiodarone 100 mg once daily - reduced d/t bradycardia  Eliquis 2.5 mg twice daily. Dose reduced per hematology (hx GIB).     Seizure disorder Acute seizure event 09/01/2022 At home patient takes Keppra 375 mg bid, weaning down IV Keppra has been switched to oral Keppra 750 twice daily  Patient will be discharged on 750 mg of Keppra twice daily Seizure precaution.  Neurologist follow up outpatient   Hypomagnesemia Hypokalemia Now resolved  continue to monitor levels outpatient   Normocytic anemia No indication for transfusion at this time Continue to monitor CBC   Diabetes mellitus with no complications Continue to monitor glucose level Continue current insulin regimen   Hx Pulmonary embolism  Daughter said pt has h/o VTE and is on eliquis.  CTA neg for PE Continue Eliquis reduced dose    DVT prophylaxis: Eliquis Pertinent  IV fluids/nutrition: no conitnuous IV fluids Central lines / invasive devices: none  Code Status: FULL CODE   Current Admission Status: inpatietn  TOC needs / Dispo plan: SNF Barriers to discharge / significant pending items: placement

## 2022-08-30 NOTE — ED Triage Notes (Signed)
Pt arrived via EMS from home for reevaluation. Per EMS pt has anxiety when going to the bathroom. Pt states "I was having labored breathing all night". Pt denies any CP, N/V/D. Pt has hx of afib and CHF. Pt a&ox4.

## 2022-08-30 NOTE — Assessment & Plan Note (Addendum)
Daughter said pt has h/o VTE and is on eliquis.  CTA neg for pe today.  Will cont eliquis.

## 2022-08-30 NOTE — Assessment & Plan Note (Signed)
Glycemic protocol.   

## 2022-08-30 NOTE — Assessment & Plan Note (Signed)
Replace and follow levels. 

## 2022-08-30 NOTE — ED Notes (Signed)
Patient transported to CT 

## 2022-08-30 NOTE — ED Provider Notes (Signed)
[facility] Provider Note    Event Date/Time   First MD Initiated Contact with Patient 08/30/22 1506     (approximate)   History   Shortness of Breath   HPI  Rebecca Wolf is a 89 y.o. female who presents to the emergency department today accompanied by family because of concerns for continued shortness of breath.  Symptoms started about a week ago.  Patient states she did notice it started around the time she started cutting back on the patient's Keppra.  The patient will start having episodes of panic like activity when she needs to go up to go to the bathroom.  She will become quite short of breath.  The patient did have some cough when this first started although that has decreased in frequency.  No fevers.  The patient did go to the emergency department 3 days ago and was started on antibiotics.  She has been taking these antibiotics.  Additionally saw her pulmonologist yesterday and was started on prednisone as well.  However the patient has continued to be short of breath.     Physical Exam   Triage Vital Signs: ED Triage Vitals  Enc Vitals Group     BP 08/30/22 1458 119/79     Pulse Rate 08/30/22 1458 86     Resp 08/30/22 1458 (!) 26     Temp 08/30/22 1458 98.6 F (37 C)     Temp src --      SpO2 08/30/22 1458 95 %     Weight 08/30/22 1455 153 lb 15.9 oz (69.8 kg)     Height 08/30/22 1455 '5\' 4"'$  (1.626 m)     Head Circumference --      Peak Flow --      Pain Score 08/30/22 1455 0     Pain Loc --      Pain Edu? --      Excl. in [place]? --     Most recent vital signs: Vitals:   08/30/22 1458  BP: 119/79  Pulse: 86  Resp: (!) 26  Temp: 98.6 F (37 C)  SpO2: 95%   General: Awake, alert. CV:  Good peripheral perfusion. Irregular rhythm. Resp:  Tachypnea. Slightly increased work of breathing. No wheezing. Abd:  No distention.  Other:  Bilateral lower extremity edema.    ED Results / Procedures / Treatments   Labs (all labs ordered  are listed, but only abnormal results are displayed) Labs Reviewed  BRAIN NATRIURETIC PEPTIDE - Abnormal; Notable for the following components:      Result Value   B Natriuretic Peptide 553.4 (*)    All other components within normal limits  COMPREHENSIVE METABOLIC PANEL - Abnormal; Notable for the following components:   Glucose, Bld 157 (*)    Calcium 8.7 (*)    Albumin 2.8 (*)    Alkaline Phosphatase 135 (*)    GFR, Estimated 55 (*)    All other components within normal limits  CBC - Abnormal; Notable for the following components:   Hemoglobin 10.8 (*)    HCT 35.7 (*)    MCV 76.9 (*)    MCH 23.3 (*)    RDW 17.5 (*)    All other components within normal limits  URINALYSIS, ROUTINE W REFLEX MICROSCOPIC - Abnormal; Notable for the following components:   Color, Urine YELLOW (*)    APPearance TURBID (*)    Specific Gravity, Urine 1.034 (*)    All other components within normal limits  RADIOLOGY I independently interpreted and visualized the CXR. My interpretation: Lower left lung opacity Radiology interpretation:  IMPRESSION:  Worsened aeration of the left lung with a possible small left  pleural effusion. Findings may reflect developing infection in the  correct clinical setting.    I independently interpreted and visualized the CT angio PE. My interpretation: No large PE. Left pleural effusion Radiology interpretation:  IMPRESSION:  1. No evidence of pulmonary embolism.  2. Small left-greater-than-right pleural effusions.  3.  Aortic atherosclerosis (ICD10-I70.0).     PROCEDURES:  Critical Care performed: No  Procedures    MEDICATIONS ORDERED IN ED: Medications - No data to display   IMPRESSION / MDM / [place] / ED COURSE  I reviewed the triage vital signs and the nursing notes.                              Differential diagnosis includes, but is not limited to, pneumonia, COPD, CHF, PE  Patient's presentation is most consistent with  acute presentation with potential threat to life or bodily function.  The patient is on the cardiac monitor to evaluate for evidence of arrhythmia and/or significant heart rate changes.  Patient presented to the emergency department today because of concern for continued shortness of breath.  On exam patient is slightly tachypneic.  X-ray was concerning for worsening process in the left lower lobe.  Was some question for pneumonia however patient afebrile and no leukocytosis.  Did get a CT to further evaluate.  CT scan that does not show findings concerning for pneumonia.  Patient's blood work also showed increasing BNP.  At this time I think most likely patient suffering from CHF exacerbation.  Given worsening breathing and worsening BNP I do think patient would benefit from further workup and management.  Discussed with Dr. Posey Pronto with the hospitalist service who will plan on admission.      FINAL CLINICAL IMPRESSION(S) / ED DIAGNOSES   Final diagnoses:  SOB (shortness of breath)  Congestive heart failure, unspecified HF chronicity, unspecified heart failure type [facility])    Note:  This document was prepared using Dragon voice recognition software and may include unintentional dictation errors.    Nance Pear, MD 08/30/22 [phone]

## 2022-08-30 NOTE — Assessment & Plan Note (Addendum)
Most recent 2DEcho last Thursday shows EF of 15% at [facility] [place] C/W reduced ejection fraction heart failure.  Hold home meds to allow room for diuresis.  Pt on lasix 20 mg iv q 12 x 2 doses , additional IV or PO per am team.  Resume meds as tolerated.  Strict I/O. Continue amiodarone.

## 2022-08-30 NOTE — Assessment & Plan Note (Signed)
2/2 chf exacerbation.  Cont with gentle diuresis.

## 2022-08-30 NOTE — H&P (Signed)
History and Physical    Chief Complaint: SOB.    HISTORY OF PRESENT ILLNESS: Rebecca Wolf is an 89 y.o. female  seen in ed today for sob since past few days. Admission request for worsening SOB. X 1 week. Notices it on ambulation to  Recently diagnosed with PNA and was seen by pulmonary.  CHF - ECHO last month at [place].  CHX effusion. CTA -no pe. No pneumonia. Chf.  Urinalysis is pending.    Pt has  Past Medical History:  Diagnosis Date   Asthma    Breast cancer ([facility]) 2003   LT LUMPECTOMY   Breast cancer ([place]) 2004   LT LUMPECTOMY   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   GERD (gastroesophageal reflux disease)    Hemorrhoids    Hypertension    Personal history of chemotherapy 2004   BREAST CA   Personal history of radiation therapy 2003   BREAST CA   Personal history of radiation therapy 2004   BREAST CA   Polyp of colon 2002   bleeding polyp of right ascending colon   PUD (peptic ulcer disease)    Seizures ([place])    epilepsy well controlled     Review of Systems  Respiratory:  Positive for shortness of breath.   Cardiovascular:  Positive for leg swelling.  All other systems reviewed and are negative.  Allergies  Allergen Reactions   Levofloxacin Shortness Of Breath   Sulfa Antibiotics Rash   Past Surgical History:  Procedure Laterality Date   BREAST EXCISIONAL BIOPSY Left 2003   positive   BREAST EXCISIONAL BIOPSY Left 2004   positive   BREAST LUMPECTOMY Left 2003   BREAST CA   BREAST LUMPECTOMY Left 2004   BREAST CA   EMBOLIZATION N/A 01/15/2022   Procedure: EMBOLIZATION;  Surgeon: Katha Cabal, MD;  Location: [facility];  Service: Cardiovascular;  Laterality: N/A;   HEMICOLECTOMY Right    bowel obstruction   HEMORRHOID SURGERY     THORACOTOMY/LOBECTOMY  1999   carcinoid tumor   TOTAL VAGINAL HYSTERECTOMY     VARICOSE VEIN SURGERY         MEDICATIONS: Current Outpatient Medications  Medication  Instructions   acetaminophen (TYLENOL) 500 mg, Oral, Every 6 hours PRN   albuterol (ACCUNEB) 1.25 MG/3ML nebulizer solution 1 ampule, 4 times daily PRN   amiodarone (PACERONE) 200 mg, Oral, Daily   amoxicillin-clavulanate (AUGMENTIN) 875-125 MG tablet 1 tablet, Oral, 2 times daily   apixaban (ELIQUIS) 2.5 mg, Oral, 2 times daily   azelastine (ASTELIN) 0.1 % nasal spray U 1 SPRAY IEN BID TO REPLACE PATANASE   budesonide (PULMICORT) 0.5 mg, Inhalation, 2 times daily PRN   COVID-19 mRNA vaccine 2023-2024 (COMIRNATY) syringe Intramuscular   fluticasone (FLONASE) 50 MCG/ACT nasal spray 2 sprays, Nasal, Daily   furosemide (LASIX) 20 mg, Oral, 2 times daily   influenza vaccine adjuvanted (FLUAD) 0.5 ML injection Intramuscular   levalbuterol (XOPENEX) 0.63 MG/3ML nebulizer solution VVN Q 4 H PRN FOR WHZ   levETIRAcetam (KEPPRA) 750 MG tablet 1 tablet, Oral, 2 times daily   magnesium oxide (MAG-OX) 400 (240 Mg) MG tablet 1 tablet, Oral, 2 times daily   montelukast (SINGULAIR) 10 mg, Oral, Daily at bedtime   pantoprazole (PROTONIX) 40 mg, Oral, Daily   potassium chloride SA (KLOR-CON M) 20 MEQ tablet 20 mEq, Oral, 2 times daily   predniSONE (DELTASONE) 2.5 mg, Daily with breakfast   predniSONE (DELTASONE) 10 mg,  Oral, Daily with breakfast   spironolactone (ALDACTONE) 25 mg, Oral, Daily    ED Course: Pt in Ed A/O afebrile.  Vitals:   08/30/22 1458 08/30/22 1600 08/30/22 1630 08/30/22 1900  BP: 119/79 131/81 123/81 123/72  Pulse: 86 94 94 91  Resp: (!) 26 (!) 25 (!) 24 18  Temp: 98.6 F (37 C)     SpO2: 95% 92% 96% (!) 87%  Weight:      Height:        Total I/O In: -  Out: 450 [Urine:450] SpO2: (!) 87 % Blood work in ed shows: Results for orders placed or performed during the hospital encounter of 08/30/22 (from the past 24 hour(s))  Brain natriuretic peptide     Status: Abnormal   Collection Time: 08/30/22  3:01 PM  Result Value Ref Range   B Natriuretic Peptide 553.4 (H) 0.0 -  100.0 pg/mL  Comprehensive metabolic panel     Status: Abnormal   Collection Time: 08/30/22  3:01 PM  Result Value Ref Range   Sodium 140 135 - 145 mmol/L   Potassium 4.7 3.5 - 5.1 mmol/L   Chloride 108 98 - 111 mmol/L   CO2 23 22 - 32 mmol/L   Glucose, Bld 157 (H) 70 - 99 mg/dL   BUN 13 8 - 23 mg/dL   Creatinine, Ser 0.95 0.44 - 1.00 mg/dL   Calcium 8.7 (L) 8.9 - 10.3 mg/dL   Total Protein 6.8 6.5 - 8.1 g/dL   Albumin 2.8 (L) 3.5 - 5.0 g/dL   AST 22 15 - 41 U/L   ALT 15 0 - 44 U/L   Alkaline Phosphatase 135 (H) 38 - 126 U/L   Total Bilirubin 0.4 0.3 - 1.2 mg/dL   GFR, Estimated 55 (L) >60 mL/min   Anion gap 9 5 - 15  CBC     Status: Abnormal   Collection Time: 08/30/22  3:01 PM  Result Value Ref Range   WBC 5.3 4.0 - 10.5 K/uL   RBC 4.64 3.87 - 5.11 MIL/uL   Hemoglobin 10.8 (L) 12.0 - 15.0 g/dL   HCT 35.7 (L) 36.0 - 46.0 %   MCV 76.9 (L) 80.0 - 100.0 fL   MCH 23.3 (L) 26.0 - 34.0 pg   MCHC 30.3 30.0 - 36.0 g/dL   RDW 17.5 (H) 11.5 - 15.5 %   Platelets 248 150 - 400 K/uL   nRBC 0.0 0.0 - 0.2 %  Urinalysis, Routine w reflex microscopic -Urine, Clean Catch     Status: Abnormal   Collection Time: 08/30/22  6:26 PM  Result Value Ref Range   Color, Urine YELLOW (A) YELLOW   APPearance TURBID (A) CLEAR   Specific Gravity, Urine 1.034 (H) 1.005 - 1.030   pH 5.0 5.0 - 8.0   Glucose, UA NEGATIVE NEGATIVE mg/dL   Hgb urine dipstick NEGATIVE NEGATIVE   Bilirubin Urine NEGATIVE NEGATIVE   Ketones, ur NEGATIVE NEGATIVE mg/dL   Protein, ur NEGATIVE NEGATIVE mg/dL   Nitrite NEGATIVE NEGATIVE   Leukocytes,Ua NEGATIVE NEGATIVE   WBC, UA NONE SEEN 0 - 5 WBC/hpf   Bacteria, UA NONE SEEN NONE SEEN   Squamous Epithelial / HPF 0-5 0 - 5 /HPF   Amorphous Crystal PRESENT     Unresulted Labs (From admission, onward)     Start     Ordered   08/31/22 0500  Comprehensive metabolic panel  Tomorrow morning,   STAT        08/30/22 1924  08/31/22 0500  CBC  Tomorrow morning,   STAT         08/30/22 1924   08/30/22 1908  Hemoglobin A1c  Once,   URGENT       Comments: To assess prior glycemic control    08/30/22 1924           Pt has received : Orders Placed This Encounter  Procedures   DG Chest 2 View    If patient pregnant, contact provider.    Standing Status:   Standing    Number of Occurrences:   1    Order Specific Question:   Reason for Exam (SYMPTOM  OR DIAGNOSIS REQUIRED)    Answer:   sob    Order Specific Question:   Radiology Contrast Protocol - do NOT remove file path    Answer:   [email or web address]   CT Angio Chest PE W and/or Wo Contrast    Standing Status:   Standing    Number of Occurrences:   1    Order Specific Question:   Does the patient have a contrast media/X-ray dye allergy?    Answer:   No    Order Specific Question:   If indicated for the ordered procedure, I authorize the administration of contrast media per Radiology protocol    Answer:   Yes    Order Specific Question:   Radiology Contrast Protocol - do NOT remove file path    Answer:   [email or web address]   Brain natriuretic peptide    Standing Status:   Standing    Number of Occurrences:   1   Comprehensive metabolic panel    Standing Status:   Standing    Number of Occurrences:   1   CBC    Standing Status:   Standing    Number of Occurrences:   1   Urinalysis, Routine w reflex microscopic -Urine, Clean Catch    Standing Status:   Standing    Number of Occurrences:   1    Order Specific Question:   Specimen Source    Answer:   Urine, Clean Catch [76]   Hemoglobin A1c    To assess prior glycemic control    Standing Status:   Standing    Number of Occurrences:   1   Comprehensive metabolic panel    Standing Status:   Standing    Number of Occurrences:   1   CBC    Standing Status:   Standing    Number of Occurrences:   1   Diet heart healthy/carb modified Room service appropriate?  Yes; Fluid consistency: Thin; Fluid restriction: 1500 mL Fluid    2 gram sodium restriction    Standing Status:   Standing    Number of Occurrences:   1    Order Specific Question:   Diet-HS Snack?    Answer:   Nothing    Order Specific Question:   Room service appropriate?    Answer:   Yes    Order Specific Question:   Fluid consistency:    Answer:   Thin    Order Specific Question:   Fluid restriction:    Answer:   1500 mL Fluid   Cardiac monitoring    Cardiac monitoring when placed in a treatment room    Standing Status:   Standing    Number of Occurrences:   1   Utilize spacer/aerochamber with mdi inhaler for COVID-19 positive patients or PUI for COVID-19    Standing  Status:   Standing    Number of Occurrences:   1   If O2 Sat <94% administer O2 at 2 liters/minute via nasal cannula    Standing Status:   Standing    Number of Occurrences:   1   Apply Diabetes Mellitus Care Plan    Standing Status:   Standing    Number of Occurrences:   1   STAT CBG when hypoglycemia is suspected. If treated, recheck every 15 minutes after each treatment until CBG >/= 70 mg/dl    Standing Status:   Standing    Number of Occurrences:   1   Refer to Hypoglycemia Protocol Sidebar Report for treatment of CBG < 70 mg/dl    Standing Status:   Standing    Number of Occurrences:   1   Notify physician (specify)    Standing Status:   Standing    Number of Occurrences:   20    Order Specific Question:   Notify Physician    Answer:   for pulse less than 60 and patient is symptomatic    Order Specific Question:   Notify Physician    Answer:   urine output less than 250 ml 4 hours after first diuretic dose OR less than 1000 ml 24 hours after admission    Order Specific Question:   Notify Physician    Answer:   SBP less than 85    Order Specific Question:   Notify Physician    Answer:   for Glycemic Control (SSI) Order Set if diabetic or glucose >140 mg/dl    Order Specific Question:   Notify Physician     Answer:   for O2 requirements exceeding 6 L/min   Initiate Heart Failure Care Plan    Standing Status:   Standing    Number of Occurrences:   1   Daily weights    Teach patient/caregiver to weigh daily and record on weight chart    Standing Status:   Standing    Number of Occurrences:   1   Strict intake and output    Standing Status:   Standing    Number of Occurrences:   1   In and Out Cath    If patient is unable to collect urine after other methods have failed (i.e., frequent toileting, condom caths), may perform In and Out cath. Follow [place] [facility]inary Catheter Guidelines    Standing Status:   Standing    Number of Occurrences:   20   Patient education (specify):    Review HF Booklet and HF video with patient and caregivers, and document completion. If patient already has HF booklet, give HF Care Note and weight chart only.    Standing Status:   Standing    Number of Occurrences:   1   Apply Heart Failure Care Plan    Standing Status:   Standing    Number of Occurrences:   1   STAT CBG when hypoglycemia is suspected. If treated, recheck every 15 minutes after each treatment until CBG >/= 70 mg/dl    Standing Status:   Standing    Number of Occurrences:   1   Refer to Hypoglycemia Protocol Sidebar Report for treatment of CBG < 70 mg/dl    Standing Status:   Standing    Number of Occurrences:   1   Cardiac Monitoring Continuous x 24 hours Indications for use: Sub-acute heart failure    Standing Status:   Standing  Number of Occurrences:   1    Order Specific Question:   Indications for use:    Answer:   Sub-acute heart failure   Maintain IV access    Standing Status:   Standing    Number of Occurrences:   1   Vital signs    Standing Status:   Standing    Number of Occurrences:   1   Notify physician (specify)    Standing Status:   Standing    Number of Occurrences:   18    Order Specific Question:   Notify Physician    Answer:   for pulse less than 55 or greater  than 120    Order Specific Question:   Notify Physician    Answer:   for respiratory rate less than 12 or greater than 25    Order Specific Question:   Notify Physician    Answer:   for temperature greater than 100.5 F    Order Specific Question:   Notify Physician    Answer:   for urinary output less than 30 mL/hr for four hours    Order Specific Question:   Notify Physician    Answer:   for systolic BP less than 90 or greater than 0000000, diastolic BP less than 60 or greater than 100    Order Specific Question:   Notify Physician    Answer:   for new hypoxia w/ oxygen saturations < 88%   Progressive Mobility Protocol: No Restrictions    Standing Status:   Standing    Number of Occurrences:   1   If patient diabetic or glucose greater than 140 notify physician for Sliding Scale Insulin Orders    Standing Status:   Standing    Number of Occurrences:   20   Initiate Oral Care Protocol    Standing Status:   Standing    Number of Occurrences:   1   Initiate Carrier Fluid Protocol    Standing Status:   Standing    Number of Occurrences:   1   RN may order General Admission PRN Orders utilizing "General Admission PRN medications" (through manage orders) for the following patient needs: allergy symptoms (Claritin), cold sores (Carmex), cough (Robitussin DM), eye irritation (Liquifilm Tears), hemorrhoids (Tucks), indigestion (Maalox), minor skin irritation (Hydrocortisone Cream), muscle pain (Ben Gay), nose irritation (saline nasal spray) and sore throat (Chloraseptic spray).    Standing Status:   Standing    Number of Occurrences:   [phone]   Full code    Standing Status:   Standing    Number of Occurrences:   1    Order Specific Question:   By:    Answer:   Other   Consult to hospitalist    Standing Status:   Standing    Number of Occurrences:   1    Order Specific Question:   Place call to:    Answer:   hospitalist    Order Specific Question:   Reason for Consult    Answer:   Admit     Order Specific Question:   Diagnosis/Clinical Info for Consult:    Answer:   chf   Consult to Transition of Care Team    Heart failure home health screen and may place order for PT / OT eval and treat if indicated    Standing Status:   Standing    Number of Occurrences:   1    Order Specific Question:   Reason for  Consult:    Answer:   SNF placement    Order Specific Question:   Reason for Consult:    Answer:   Other (see comments)   Nutritional services consult    Standing Status:   Standing    Number of Occurrences:   1    Order Specific Question:   Reason for Consult?    Answer:   nutritional goals   OT eval and treat    Standing Status:   Standing    Number of Occurrences:   1   PT eval and treat    Standing Status:   Standing    Number of Occurrences:   1   Pulse oximetry, continuous    Pulse oximetry when placed in a treatment room    Standing Status:   Standing    Number of Occurrences:   1   Adult wheeze protocol - initiate by RT    Standing Status:   Standing    Number of Occurrences:   1   Pulse oximetry check with vital signs    Standing Status:   Standing    Number of Occurrences:   1   Oxygen therapy Mode or (Route): Nasal cannula; Liters Per Minute: 2; Keep 02 saturation: greater than 92 %    Standing Status:   Standing    Number of Occurrences:   1    Order Specific Question:   Mode or (Route)    Answer:   Nasal cannula    Order Specific Question:   Liters Per Minute    Answer:   2    Order Specific Question:   Keep 02 saturation    Answer:   greater than 92 %   Incentive spirometry    Standing Status:   Standing    Number of Occurrences:   1   ED EKG    Standing Status:   Standing    Number of Occurrences:   1    Order Specific Question:   Reason for Exam    Answer:   Shortness of breath   Insert peripheral IV    Standing Status:   Standing    Number of Occurrences:   1   Place in observation (patient's expected length of stay will be less than 2  midnights)    Standing Status:   Standing    Number of Occurrences:   1    Order Specific Question:   Hospital Area    Answer:   [facility] [[id number]]    Order Specific Question:   Level of Care    Answer:   Tele[facility] Medical [104]    Order Specific Question:   Covid Evaluation    Answer:   Asymptomatic - no recent exposure (last 10 days) testing not required    Order Specific Question:   Diagnosis    Answer:   SOB (shortness of breath) [id number]    Order Specific Question:   Admitting Physician    Answer:   Cherylann Ratel    Order Specific Question:   Attending Physician    Answer:   Cherylann Ratel   Seizure precautions    Standing Status:   Standing    Number of Occurrences:   1   Fall precautions    Standing Status:   Standing    Number of Occurrences:   1   Aspiration precautions    Standing Status:   Standing    Number of  Occurrences:   1    Meds ordered this encounter  Medications   iohexol (OMNIPAQUE) 350 MG/ML injection 75 mL   furosemide (LASIX) injection 60 mg   furosemide (LASIX) injection 20 mg   insulin aspart (novoLOG) injection 0-5 Units    Order Specific Question:   Correction coverage:    Answer:   HS scale    Order Specific Question:   CBG < 70:    Answer:   Implement Hypoglycemia Standing Orders and refer to Hypoglycemia Standing Orders sidebar report    Order Specific Question:   CBG 70 - 120:    Answer:   0 units    Order Specific Question:   CBG 121 - 150:    Answer:   0 units    Order Specific Question:   CBG 151 - 200:    Answer:   0 units    Order Specific Question:   CBG 201 - 250:    Answer:   2 units    Order Specific Question:   CBG 251 - 300:    Answer:   3 units    Order Specific Question:   CBG 301 - 350:    Answer:   4 units    Order Specific Question:   CBG 351 - 400:    Answer:   5 units    Order Specific Question:   CBG > 400    Answer:   call MD and obtain STAT lab verification   sodium chloride  flush (NS) 0.9 % injection 3 mL   OR Linked Order Group    acetaminophen (TYLENOL) tablet 650 mg    acetaminophen (TYLENOL) suppository 650 mg   polyethylene glycol (MIRALAX / GLYCOLAX) packet 17 g   bisacodyl (DULCOLAX) EC tablet 5 mg   hydrALAZINE (APRESOLINE) injection 5 mg   insulin aspart (novoLOG) injection 0-9 Units    Order Specific Question:   Correction coverage:    Answer:   Sensitive (thin, NPO, renal)    Order Specific Question:   CBG < 70:    Answer:   Implement Hypoglycemia Standing Orders and refer to Hypoglycemia Standing Orders sidebar report    Order Specific Question:   CBG 70 - 120:    Answer:   0 units    Order Specific Question:   CBG 121 - 150:    Answer:   1 unit    Order Specific Question:   CBG 151 - 200:    Answer:   2 units    Order Specific Question:   CBG 201 - 250:    Answer:   3 units    Order Specific Question:   CBG 251 - 300:    Answer:   5 units    Order Specific Question:   CBG 301 - 350:    Answer:   7 units    Order Specific Question:   CBG 351 - 400    Answer:   9 units    Order Specific Question:   CBG > 400    Answer:   call MD and obtain STAT lab verification   DISCONTD: heparin injection 5,000 Units   amiodarone (PACERONE) tablet 200 mg   apixaban (ELIQUIS) tablet 2.5 mg   fluticasone (FLONASE) 50 MCG/ACT nasal spray 2 spray   levETIRAcetam (KEPPRA) tablet 375 mg   montelukast (SINGULAIR) tablet 10 mg   pantoprazole (PROTONIX) EC tablet 40 mg     Admission Imaging : CT Angio Chest PE W  and/or Wo Contrast Result Date: 08/30/2022 CLINICAL DATA:  Shortness of breath.  Denies chest pain. EXAM: CT ANGIOGRAPHY CHEST WITH CONTRAST TECHNIQUE: Multidetector CT imaging of the chest was performed using the standard protocol during bolus administration of intravenous contrast. Multiplanar CT image reconstructions and MIPs were obtained to evaluate the vascular anatomy. RADIATION DOSE REDUCTION: This exam was performed according to the  departmental dose-optimization program which includes automated exposure control, adjustment of the mA and/or kV according to patient size and/or use of iterative reconstruction technique. CONTRAST:  72m OMNIPAQUE IOHEXOL 350 MG/ML SOLN COMPARISON:  Chest x-ray from same day. CT chest dated June 29, 2017. FINDINGS: Cardiovascular: Satisfactory opacification of the pulmonary arteries to the segmental level. No evidence of pulmonary embolism. Chronic cardiomegaly. No pericardial effusion. No thoracic aortic aneurysm. Coronary, aortic arch, and branch vessel atherosclerotic vascular disease. Mediastinum/Nodes: No enlarged mediastinal, hilar, or axillary lymph nodes. Unchanged 2.7 x 1.7 cm right paratracheal nodule, likely postsurgical. Thyroid gland, trachea, and esophagus demonstrate no significant findings. Lungs/Pleura: Small left-greater-than-right pleural effusions. Left lower lobe subsegmental atelectasis. Unchanged scarring and bronchiectasis in the left-greater-than-right upper lobes. No consolidation or pneumothorax. Unchanged right upper and middle lobe lung nodules measuring up 6 mm. No follow-up imaging is recommended. Upper Abdomen: No acute abnormality. Musculoskeletal: No chest wall abnormality. No acute or significant osseous findings. Review of the MIP images confirms the above findings. IMPRESSION: 1. No evidence of pulmonary embolism. 2. Small left-greater-than-right pleural effusions. 3.  Aortic atherosclerosis (ICD10-I70.0). Electronically Signed   By: WTitus DubinM.D.   On: 08/30/2022 17:12   DG Chest 2 View Result Date: 08/30/2022 CLINICAL DATA:  Labored breathing EXAM: CHEST - 2 VIEW COMPARISON:  Chest radiograph 2 days prior FINDINGS: Mild cardiomegaly is unchanged. The upper mediastinal contours are stable. There is worsened aeration of the left base since the study from 1 day prior with a possible small left pleural effusion. The left upper lobe remains well-aerated. The right lung  is clear. There is no right effusion. There is no pneumothorax There is no acute osseous abnormality. Scoliotic curvature of the spine is unchanged.  IMPRESSION: Worsened aeration of the left lung with a possible small left pleural effusion. Findings may reflect developing infection in the correct clinical setting. Electronically Signed   By: PValetta MoleM.D.   On: 08/30/2022 15:28   Physical Examination: Vitals:   08/30/22 1458 08/30/22 1600 08/30/22 1630 08/30/22 1900  BP: 119/79 131/81 123/81 123/72  Pulse: 86 94 94 91  Temp: 98.6 F (37 C)     Resp: (!) 26 (!) 25 (!) 24 18  Height:      Weight:      SpO2: 95% 92% 96% (!) 87%  BMI (Calculated):       Physical Exam Vitals and nursing note reviewed.  Constitutional:      General: She is not in acute distress.    Appearance: She is not ill-appearing, toxic-appearing or diaphoretic.  HENT:     Head: Normocephalic and atraumatic.     Right Ear: Hearing and external ear normal.     Left Ear: Hearing and external ear normal.     Nose: Nose normal. No nasal deformity.     Mouth/Throat:     Lips: Pink.     Mouth: Mucous membranes are moist.     Tongue: No lesions.     Pharynx: Oropharynx is clear.  Eyes:     Extraocular Movements: Extraocular movements intact.  Cardiovascular:  Rate and Rhythm: Normal rate and regular rhythm.     Pulses: Normal pulses.          Dorsalis pedis pulses are 2+ on the right side and 2+ on the left side.       Posterior tibial pulses are 2+ on the right side and 2+ on the left side.     Heart sounds: Normal heart sounds.  Pulmonary:     Effort: Pulmonary effort is normal.     Breath sounds: Rales present.  Abdominal:     General: Bowel sounds are normal. There is no distension.     Palpations: Abdomen is soft. There is no mass.     Tenderness: There is no abdominal tenderness. There is no guarding.     Hernia: No hernia is present.  Musculoskeletal:     Right lower leg: 3+ Edema present.      Left lower leg: 3+ Edema present.  Skin:    General: Skin is warm.  Neurological:     General: No focal deficit present.     Mental Status: She is alert and oriented to person, place, and time.     Cranial Nerves: Cranial nerves 2-12 are intact.     Motor: Motor function is intact.  Psychiatric:        Attention and Perception: Attention normal.        Mood and Affect: Mood normal.        Speech: Speech normal.        Behavior: Behavior normal. Behavior is cooperative.        Cognition and Memory: Cognition normal.     Assessment and Plan: * SOB (shortness of breath) 2/2 chf exacerbation.  Cont with gentle diuresis.   Chronic systolic CHF (congestive heart failure) ([place]) Most recent 2DEcho last Thursday shows EF of 15% at [place] C/W reduced ejection fraction heart failure.  Hold home meds to allow room for diuresis.  Pt on lasix 20 mg iv q 12 x 2 doses , additional IV or PO per am team.  Resume meds as tolerated.  Strict I/O. Continue amiodarone.     Seizure disorder (HCC) Keppra 375 mg bid./  Seizure precaution.   Electrolyte abnormality Replace and follow levels.   Anemia    Latest Ref Rng & Units 08/30/2022    3:01 PM 08/27/2022    2:36 PM 07/01/2022    7:48 AM  CBC  WBC 4.0 - 10.5 K/uL 5.3  5.8  7.9   Hemoglobin 12.0 - 15.0 g/dL 10.8  10.5  10.2   Hematocrit 36.0 - 46.0 % 35.7  35.0  34.4   Platelets 150 - 400 K/uL 248  215  282   Hb is stable.  D/W daughter at bedside to d/w PCP about eval for ongoing anemia as pt is on blood thinner , has h/o GIB although no melena or bleeding reported today, chronic GI loss may be there and may need iron supplementation.      Diabetes mellitus with no complication (HCC) Glycemic protocol.    CKD stage G3b/A1, GFR 30-44 and albumin creatinine ratio <30 mg/g (HCC) Lab Results  Component Value Date   CREATININE 0.95 08/30/2022   CREATININE 1.10 (H) 08/27/2022   CREATININE 1.02 (H) 07/01/2022  Stable. Avoid  contrast.  Renally dose meds.     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  CTA neg for pe today.  Will cont eliquis.  DVT prophylaxis:  Eliquis.   Code Status:  Full code.    Family Communication:  Lori, Ambrosini (Daughter) [phone]   Disposition Plan:  TBD.   Consults called:  None.  Admission status: Inpatient.    Unit/ Expected LOS: Telemetry.  2- days.    Para Skeans MD [facility]  6 PM- 2 AM. Please contact me via secure Chat 6 PM-2 AM. [phone]( Pager ) To contact the [facility] Attending or Consulting provider [place] or covering provider during after hours [place], for this patient.   Check the care team in [facility] and look for a) attending/consulting [facility] provider listed and b) the [facility] team listed Log into [email or web address] and use [place]'s universal password to access. If you do not have the password, please contact the hospital operator. Locate the [facility] provider you are looking for under [facility] and page to a number that you can be directly reached. If you still have difficulty reaching the provider, please page the [facility] (Director on Call) for the Hospitalists listed on amion for assistance. [email or web address] 08/30/2022, 8:36 PM

## 2022-08-30 NOTE — Assessment & Plan Note (Signed)
Lab Results  Component Value Date   CREATININE 0.95 08/30/2022   CREATININE 1.10 (H) 08/27/2022   CREATININE 1.02 (H) 07/01/2022  Stable. Avoid contrast.  Renally dose meds.

## 2022-08-30 NOTE — Assessment & Plan Note (Signed)
    Latest Ref Rng & Units 08/30/2022    3:01 PM 08/27/2022    2:36 PM 07/01/2022    7:48 AM  CBC  WBC 4.0 - 10.5 K/uL 5.3  5.8  7.9   Hemoglobin 12.0 - 15.0 g/dL 10.8  10.5  10.2   Hematocrit 36.0 - 46.0 % 35.7  35.0  34.4   Platelets 150 - 400 K/uL 248  215  282   Hb is stable.  D/W daughter at bedside to d/w PCP about eval for ongoing anemia as pt is on blood thinner , has h/o GIB although no melena or bleeding reported today, chronic GI loss may be there and may need iron supplementation.

## 2022-08-30 NOTE — Progress Notes (Signed)
Patient's daughter refused Lasix scheduled for midnight, since pt already had '80mg'$  of Lasix today including 20 mg given at home. Pt had good output. Daughter would like to discuss with hospitalist in the morning further need for lasix. Pt is comfortable, resting in bed, no chest pain and no c/o SOB.

## 2022-08-30 NOTE — Assessment & Plan Note (Signed)
Keppra 375 mg bid./  Seizure precaution.

## 2022-08-31 ENCOUNTER — Encounter: Payer: Self-pay | Admitting: Internal Medicine

## 2022-08-31 DIAGNOSIS — I509 Heart failure, unspecified: Secondary | ICD-10-CM | POA: Diagnosis not present

## 2022-08-31 DIAGNOSIS — R0602 Shortness of breath: Secondary | ICD-10-CM | POA: Diagnosis not present

## 2022-08-31 LAB — CBC
HCT: 31.5 % — ABNORMAL LOW (ref 36.0–46.0)
Hemoglobin: 9.7 g/dL — ABNORMAL LOW (ref 12.0–15.0)
MCH: 23.1 pg — ABNORMAL LOW (ref 26.0–34.0)
MCHC: 30.8 g/dL (ref 30.0–36.0)
MCV: 75 fL — ABNORMAL LOW (ref 80.0–100.0)
Platelets: 227 10*3/uL (ref 150–400)
RBC: 4.2 MIL/uL (ref 3.87–5.11)
RDW: 17.4 % — ABNORMAL HIGH (ref 11.5–15.5)
WBC: 6 10*3/uL (ref 4.0–10.5)
nRBC: 0 % (ref 0.0–0.2)

## 2022-08-31 LAB — COMPREHENSIVE METABOLIC PANEL
ALT: 14 U/L (ref 0–44)
AST: 24 U/L (ref 15–41)
Albumin: 2.4 g/dL — ABNORMAL LOW (ref 3.5–5.0)
Alkaline Phosphatase: 111 U/L (ref 38–126)
Anion gap: 10 (ref 5–15)
BUN: 11 mg/dL (ref 8–23)
CO2: 23 mmol/L (ref 22–32)
Calcium: 8.4 mg/dL — ABNORMAL LOW (ref 8.9–10.3)
Chloride: 105 mmol/L (ref 98–111)
Creatinine, Ser: 0.96 mg/dL (ref 0.44–1.00)
GFR, Estimated: 54 mL/min — ABNORMAL LOW (ref >60)
Glucose, Bld: 128 mg/dL — ABNORMAL HIGH (ref 70–99)
Potassium: 3.7 mmol/L (ref 3.5–5.1)
Sodium: 138 mmol/L (ref 135–145)
Total Bilirubin: 0.5 mg/dL (ref 0.3–1.2)
Total Protein: 5.9 g/dL — ABNORMAL LOW (ref 6.5–8.1)

## 2022-08-31 LAB — GLUCOSE, CAPILLARY
Glucose-Capillary: 109 mg/dL — ABNORMAL HIGH (ref 70–99)
Glucose-Capillary: 119 mg/dL — ABNORMAL HIGH (ref 70–99)
Glucose-Capillary: 158 mg/dL — ABNORMAL HIGH (ref 70–99)
Glucose-Capillary: 167 mg/dL — ABNORMAL HIGH (ref 70–99)

## 2022-08-31 MED ORDER — ADULT MULTIVITAMIN W/MINERALS CH
1.0000 | ORAL_TABLET | Freq: Every day | ORAL | Status: DC
  Administered 2022-08-31 – 2022-09-06 (×7): 1 via ORAL
  Filled 2022-08-31 (×7): qty 1

## 2022-08-31 MED ORDER — ZINC SULFATE 220 (50 ZN) MG PO CAPS: 220.0000 mg | ORAL_CAPSULE | Freq: Every day | ORAL | Status: DC

## 2022-08-31 MED ORDER — ZINC SULFATE 220 (50 ZN) MG PO CAPS
220.0000 mg | ORAL_CAPSULE | Freq: Every day | ORAL | Status: DC
  Administered 2022-08-31 – 2022-09-06 (×7): 220 mg via ORAL
  Filled 2022-08-31 (×7): qty 1

## 2022-08-31 MED ORDER — VITAMIN C 500 MG PO TABS
500.0000 mg | ORAL_TABLET | Freq: Every day | ORAL | Status: DC
  Administered 2022-08-31 – 2022-09-02 (×3): 500 mg via ORAL
  Filled 2022-08-31 (×3): qty 1

## 2022-08-31 MED ORDER — GLUCERNA SHAKE PO LIQD
237.0000 mL | Freq: Three times a day (TID) | ORAL | Status: DC
  Administered 2022-08-31 – 2022-09-06 (×16): 237 mL via ORAL

## 2022-08-31 MED ORDER — BUDESONIDE 0.5 MG/2ML IN SUSP
0.5000 mg | Freq: Two times a day (BID) | RESPIRATORY_TRACT | Status: DC
  Administered 2022-08-31 – 2022-09-06 (×9): 0.5 mg via RESPIRATORY_TRACT
  Filled 2022-08-31 (×12): qty 2

## 2022-08-31 NOTE — Progress Notes (Signed)
Progress Note   Patient: Rebecca Wolf [id number] DOB: 1924/10/19 DOA: 08/30/2022     0 DOS: the patient was seen and examined on 08/31/2022   Brief hospital course: 89 year old woman from community with past medical history of hypertension, seizure disorder, CHF with EF of 20% and GERD  Presented to the ED with complaint of shortness of breath.  Patient reports shortness of breath lasted about 30 minutes and then resolved.  Patient was asymptomatic on presentation in the ED.  As per daughter patient momentarily gets short of breath when she has to use the restroom in the heat of the event patient gets anxious and short of breath.  No complaint of associated chest pain, diaphoresis, headache, blurring of vision, palpitation, abdominal pain, dysuria and diarrhea.  No sick contacts or recent travel.  3/2 : Patient was bradycardic is on amiodarone 200 mg daily.  Patient with no documentation of arrhythmia.  Was tachycardic on presentation which improved in the ED close now with bradycardia.  Patient with a either need discontinuation of amiodarone versus decreased dose At the time of discharge continues to stay on Lasix 20 mg twice daily.  Patient euvolemic potential discharge tomorrow.  Assessment and Plan: * SOB (shortness of breath) 2/2 chf exacerbation.  Cont with gentle diuresis.  Chronic systolic CHF (congestive heart failure)  Most recent 2DEcho last Thursday shows EF of 15% at [place] C/W reduced ejection fraction heart failure.  Hold home meds to allow room for diuresis.  Pt on lasix 20 mg iv q 12 x 2 doses , additional IV or PO per am team.  Resume meds as tolerated.  Strict I/O. Continue amiodarone. -Needs adjustment of dose prior to discharge as patient is bradycardic  Seizure disorder  Keppra 375 mg bid.-Neurology has been decreased the dose of Keppra. Seizure precaution.   Electrolyte abnormality Replace and follow levels.  Anemia    Latest Ref Rng & Units 08/30/2022     3:01 PM 08/27/2022    2:36 PM 07/01/2022    7:48 AM  CBC  WBC 4.0 - 10.5 K/uL 5.3  5.8  7.9   Hemoglobin 12.0 - 15.0 g/dL 10.8  10.5  10.2   Hematocrit 36.0 - 46.0 % 35.7  35.0  34.4   Platelets 150 - 400 K/uL 248  215  282   Hb is stable.  D/W daughter at bedside to d/w PCP about eval for ongoing anemia as pt is on blood thinner , has h/o GIB although no melena or bleeding reported today, chronic GI loss may be there and may need iron supplementation.   Diabetes mellitus with no complication  Glycemic protocol.   CKD stage G3b/A1, GFR 30-44 and albumin creatinine ratio <30 mg/g (HCC) Lab Results  Component Value Date   CREATININE 0.95 08/30/2022   CREATININE 1.10 (H) 08/27/2022   CREATININE 1.02 (H) 07/01/2022  Stable. Avoid contrast.  Renally dose meds.   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  CTA neg for pe today.  Will cont eliquis.     Subjective: Patient seen and examined at bedside this morning.  No overnight events.  Vital labs and imaging reviewed.  Patient vitally stable labs within reference range.  No new imaging.  Daughter at bedside updated about the status.  Physical Exam: Vitals:   08/30/22 2309 08/31/22 0407 08/31/22 0500 08/31/22 0727  BP: (!) 129/52 (!) 119/53  (!) 124/55  Pulse: 89 92    Resp:  18 18    Temp: (!) 97.5 F (36.4 C) 98.2 F (36.8 C)  97.9 F (36.6 C)  TempSrc:    Oral  SpO2: 98% 96%  98%  Weight:   74.5 kg   Height:       Physical Exam HENT:     Head: Normocephalic.  Eyes:     Pupils: Pupils are equal, round, and reactive to light.  Cardiovascular:     Rate and Rhythm: Bradycardia present.     Pulses: Normal pulses.  Pulmonary:     Effort: Pulmonary effort is normal.     Breath sounds: No wheezing or rhonchi.  Abdominal:     General: Bowel sounds are normal.     Palpations: Abdomen is soft.  Musculoskeletal:        General: Normal range of motion.  Skin:    General: Skin is warm.   Neurological:     General: No focal deficit present.     Mental Status: She is alert.      Data Reviewed:  Results are pending, will review when available.  Family Communication:   Disposition: Status is: Observation The patient remains OBS appropriate and will d/c before 2 midnights.     Time spent: 32 minutes  Author: Oran Rein, MD 08/31/2022 7:54 AM  For on call review [email or web address].

## 2022-08-31 NOTE — Progress Notes (Signed)
Initial Nutrition Assessment  DOCUMENTATION CODES:   Not applicable  INTERVENTION:  - Liberalize to Regular diet, 1500 mL fluid restriction   - Add Glucerna Shake po TID, each supplement provides 220 kcal and 10 grams of protein  - Add MVI q day   - Add Vit C   - Add Zinc (x14 days)  NUTRITION DIAGNOSIS:   Increased nutrient needs related to wound healing as evidenced by estimated needs.  GOAL:   Patient will meet greater than or equal to 90% of their needs  MONITOR:   PO intake, Supplement acceptance  REASON FOR ASSESSMENT:   Consult Assessment of nutrition requirement/status  ASSESSMENT:   89 y.o. female admits related to SOB. PMH includes: asthma, breast cancer, GERD, hemorrhoids, HTN.  Meds reviewed: lasix, sliding scale insulin. Labs reviewed.   RD attempted to call pt's room but no answer. Pt with increased nutrient needs related to wound healing. RD will liberalize diet to allow pt more options. RD will also add supplements and wound healing vitamins. Will continue to monitor PO intakes.   NUTRITION - FOCUSED PHYSICAL EXAM:  Unable to assess related to remote assessment.   Diet Order:   Diet Order             Diet heart healthy/carb modified Room service appropriate? Yes; Fluid consistency: Thin; Fluid restriction: 1500 mL Fluid  Diet effective now                   EDUCATION NEEDS:   Not appropriate for education at this time  Skin:  Skin Assessment: Skin Integrity Issues: Skin Integrity Issues:: Stage II Stage II: sacrum  Last BM:  3/2  Height:   Ht Readings from Last 1 Encounters:  08/30/22 '5\' 4"'$  (1.626 m)    Weight:   Wt Readings from Last 1 Encounters:  08/31/22 74.5 kg    Ideal Body Weight:     BMI:  Body mass index is 28.19 kg/m.  Estimated Nutritional Needs:   Kcal:  2235-2600 kcals  Protein:  110-130 gm  Fluid:  </= 1.5 L  Thalia Bloodgood, RD, LDN, CNSC.

## 2022-08-31 NOTE — Progress Notes (Signed)
PT Cancellation Note  Patient Details Name: Rebecca Wolf MRN: [id number] DOB: 04-17-1925   Cancelled Treatment:    Reason Eval/Treat Not Completed: Other (comment). Pt resting in bed upon arrival with family in room. Encouraged mobility, however pt currently requests for PT/OT to attempt tomorrow if pt up for mobility. Left with all needs intact.   Murphy Bundick 08/31/2022, 1:06 PM Greggory Stallion, PT, DPT, GCS [phone]

## 2022-08-31 NOTE — Progress Notes (Signed)
OT Cancellation Note  Patient Details Name: Rebecca Wolf MRN: [id number] DOB: 02/17/1925   Cancelled Treatment:    Reason Eval/Treat Not Completed: Other (comment). OT orders received, chart reviewed. Pt received in bed with daughter present. Pt deferred all EOB/OOB mobility at this time and requested OT come back this afternoon. Will re-attempt as able.  Lanelle Bal  [facility] 08/31/2022, 11:36 AM

## 2022-08-31 NOTE — Progress Notes (Signed)
       CROSS COVER NOTE  NAME: Rebecca Wolf MRN: [id number] DOB : 16-May-1925    HPI/Events of Note   Patient request home pulmicort neb regimen  Assessment and  Interventions   Assessment: Patient newly followed by pulmonologist Dr Raul Del outpatient. Initial consult with him 2/29.  History of asthma, bronchiectasis with symptoms controlled on current regimen that included pulmicort nebs. Albuterol use discontinued secondary to tachycardia. Per Dr Raul Del s evaluation there was a question of undiagnosed COPD as she has had + smoking history Plan: Pulmicort nebs BID resumed       Kathlene Cote NP  [facility]

## 2022-09-01 DIAGNOSIS — R0602 Shortness of breath: Secondary | ICD-10-CM | POA: Diagnosis not present

## 2022-09-01 DIAGNOSIS — R569 Unspecified convulsions: Secondary | ICD-10-CM

## 2022-09-01 DIAGNOSIS — I5023 Acute on chronic systolic (congestive) heart failure: Secondary | ICD-10-CM

## 2022-09-01 DIAGNOSIS — I4891 Unspecified atrial fibrillation: Secondary | ICD-10-CM

## 2022-09-01 LAB — BASIC METABOLIC PANEL
Anion gap: 9 (ref 5–15)
BUN: 15 mg/dL (ref 8–23)
CO2: 25 mmol/L (ref 22–32)
Calcium: 8.1 mg/dL — ABNORMAL LOW (ref 8.9–10.3)
Chloride: 105 mmol/L (ref 98–111)
Creatinine, Ser: 0.98 mg/dL (ref 0.44–1.00)
GFR, Estimated: 53 mL/min — ABNORMAL LOW (ref >60)
Glucose, Bld: 106 mg/dL — ABNORMAL HIGH (ref 70–99)
Potassium: 3.4 mmol/L — ABNORMAL LOW (ref 3.5–5.1)
Sodium: 139 mmol/L (ref 135–145)

## 2022-09-01 LAB — MAGNESIUM
Magnesium: 1.3 mg/dL — ABNORMAL LOW (ref 1.7–2.4)
Magnesium: 3.2 mg/dL — ABNORMAL HIGH (ref 1.7–2.4)

## 2022-09-01 LAB — GLUCOSE, CAPILLARY
Glucose-Capillary: 134 mg/dL — ABNORMAL HIGH (ref 70–99)
Glucose-Capillary: 126 mg/dL — ABNORMAL HIGH (ref 70–99)
Glucose-Capillary: 160 mg/dL — ABNORMAL HIGH (ref 70–99)
Glucose-Capillary: 168 mg/dL — ABNORMAL HIGH (ref 70–99)

## 2022-09-01 MED ORDER — SPIRONOLACTONE 25 MG PO TABS
25.0000 mg | ORAL_TABLET | Freq: Every day | ORAL | Status: DC
  Administered 2022-09-01 – 2022-09-04 (×4): 25 mg via ORAL
  Filled 2022-09-01 (×4): qty 1

## 2022-09-01 MED ORDER — POTASSIUM CHLORIDE CRYS ER 20 MEQ PO TBCR
40.0000 meq | EXTENDED_RELEASE_TABLET | ORAL | Status: AC
  Administered 2022-09-01 (×2): 40 meq via ORAL
  Filled 2022-09-01 (×2): qty 2

## 2022-09-01 MED ORDER — SODIUM CHLORIDE 0.9 % IV SOLN
750.0000 mg | Freq: Two times a day (BID) | INTRAVENOUS | Status: DC
  Administered 2022-09-01 – 2022-09-03 (×4): 750 mg via INTRAVENOUS
  Filled 2022-09-01 (×5): qty 7.5

## 2022-09-01 MED ORDER — AMIODARONE HCL 200 MG PO TABS
100.0000 mg | ORAL_TABLET | Freq: Every day | ORAL | Status: DC
  Administered 2022-09-02 – 2022-09-04 (×3): 100 mg via ORAL
  Filled 2022-09-01 (×3): qty 1

## 2022-09-01 MED ORDER — MAGNESIUM SULFATE 2 GM/50ML IV SOLN
2.0000 g | INTRAVENOUS | Status: AC
  Administered 2022-09-01 (×2): 2 g via INTRAVENOUS
  Filled 2022-09-01 (×2): qty 50

## 2022-09-01 MED ORDER — LOSARTAN POTASSIUM 25 MG PO TABS
25.0000 mg | ORAL_TABLET | Freq: Every day | ORAL | Status: DC
  Administered 2022-09-01 – 2022-09-06 (×6): 25 mg via ORAL
  Filled 2022-09-01 (×6): qty 1

## 2022-09-01 MED ORDER — MAGNESIUM SULFATE 2 GM/50ML IV SOLN
2.0000 g | Freq: Once | INTRAVENOUS | Status: AC
  Administered 2022-09-01: 2 g via INTRAVENOUS
  Filled 2022-09-01: qty 50

## 2022-09-01 NOTE — TOC Initial Note (Signed)
Transition of Care [facility]) - Initial/Assessment Note    Patient Details  Name: Rebecca Wolf MRN: [id number] Date of Birth: [birth year]  Transition of Care [facility]) CM/SW Contact:    Rebecca Wolf, [place] Phone Number: 09/01/2022, 4:49 PM  Clinical Narrative:  CSW spoke with patients daughter Rebecca Wolf, [id number]  who was present in the room. Patients daughter stated that her mother will most likely want to discharge home but she feels her mother needs SNF. Patients daughter stated that her mother has PCS services currently through medicaid with [facility]. Patients daughter stated that nurse is not helpful. Patients daughter stated she does not want her mother at [facility] or [facility]. Patients daughter stated she needs time to think about SNF vs Home. CSW started SNF bed search in case family changes their mind.                   Expected Discharge Plan: Skilled Nursing Facility Barriers to Discharge: Continued Medical Work up   Patient Goals and CMS Choice Patient states their goals for this hospitalization and ongoing recovery are:: Family is unsure if they want to move forward with SNF or return home          Expected Discharge Plan and Services       Living arrangements for the past 2 months: Single Family Home                                      Prior Living Arrangements/Services Living arrangements for the past 2 months: Single Family Home Lives with:: Adult Children Patient language and need for interpreter reviewed:: Yes Do you feel safe going back to the place where you live?: Yes      Need for Family Participation in Patient Care: Yes (Comment) Care giver support system in place?: Yes (comment) Current home services: Home RN ([facility], PCS services) Criminal Activity/Legal Involvement Pertinent to Current Situation/Hospitalization: No - Comment as needed  Activities of Daily Living Home Assistive Devices/Equipment: Environmental consultant (specify  type) ADL Screening (condition at time of admission) Patient's cognitive ability adequate to safely complete daily activities?: Yes Is the patient deaf or have difficulty hearing?: Yes Does the patient have difficulty seeing, even when wearing glasses/contacts?: No Does the patient have difficulty concentrating, remembering, or making decisions?: No Patient able to express need for assistance with ADLs?: Yes Does the patient have difficulty dressing or bathing?: Yes Independently performs ADLs?: No Communication: Independent Dressing (OT): Needs assistance Is this a change from baseline?: Pre-admission baseline Grooming: Needs assistance Is this a change from baseline?: Pre-admission baseline Feeding: Needs assistance Is this a change from baseline?: Pre-admission baseline Bathing: Needs assistance Is this a change from baseline?: Pre-admission baseline Toileting: Needs assistance Is this a change from baseline?: Pre-admission baseline In/Out Bed: Needs assistance Is this a change from baseline?: Pre-admission baseline Walks in Home: Needs assistance Is this a change from baseline?: Pre-admission baseline Does the patient have difficulty walking or climbing stairs?: Yes Weakness of Legs: Both Weakness of Arms/Hands: None  Permission Sought/Granted   Permission granted to share information with : Yes, Verbal Permission Granted              Emotional Assessment Appearance:: Appears stated age Attitude/Demeanor/Rapport: Engaged Affect (typically observed): Calm Orientation: : Oriented to Self, Oriented to Place, Oriented to  Time, Oriented to Situation Alcohol / Substance Use: Not Applicable Psych  Involvement: No (comment)  Admission diagnosis:  SOB (shortness of breath) [R06.02] Congestive heart failure, unspecified HF chronicity, unspecified heart failure type ([place]) [I50.9] Patient Active Problem List   Diagnosis Date Noted   Electrolyte abnormality 08/30/2022   SOB  (shortness of breath) 08/30/2022   Acute blood loss anemia 01/16/2022   Overweight (BMI 25.0-29.9) [id number]   Chronic systolic CHF (congestive heart failure) ([place]) 01/16/2022   Hypomagnesemia    Leukocytosis    GIB (gastrointestinal bleeding) 01/15/2022   Pressure injury of skin 01/15/2022   B12 deficiency 10/17/2019   Folate deficiency 10/17/2019   Anemia 08/29/2019   Iron deficiency anemia 08/26/2019   Chronic anticoagulation 10/25/2018   CKD stage G3b/A1, GFR 30-44 and albumin creatinine ratio <30 mg/g ([place]) 10/05/2018   MGUS (monoclonal gammopathy of unknown significance) 07/23/2018   Osteopenia after menopause 07/16/2018   Diabetes mellitus with no complication ([place]) [id number]   Vitamin D deficiency 04/07/2018   Heart palpitations 01/06/2018   Tachycardia 01/06/2018   Cystocele with rectocele 10/30/2017   Rectocele 10/30/2017   Microcytic red blood cells 09/23/2017   Localized edema 02/13/2017   Hemorrhoids 05/17/2016   Obesity (BMI 30-39.9) 04/26/2016   Multiple lung nodules 04/19/2016   Chest pain 04/17/2016   Partial symptomatic epilepsy with complex partial seizures, not intractable, without status epilepticus ([place]) 06/30/2015   Bronchiectasis without complication ([place]) [id number]   Imbalance 11/08/2014   Hypertension 11/08/2014   Personal history of malignant neoplasm of breast 11/08/2014   History of malignant carcinoid tumor of bronchus and lung 11/08/2014   H/O peptic ulcer 11/08/2014   Asthma, mild intermittent 11/08/2014   Pulmonary embolism ([place]) 11/08/2014   Gonalgia 11/08/2014   Seizure disorder ([place]) 11/08/2014   Leg varices 11/08/2014   PCP:  Rebecca Levine, MD Pharmacy:   [facility] [id number] Rebecca Wolf, [address] [place] [address] Phone: [phone] Fax: [place], [address] [facility] [address] [place] [address] Phone: [phone] Fax: [phone]  [facility] [place], [place] [address] Phone: [phone] Fax: [phone]     Social Determinants of Health (SDOH) Social History: SDOH Screenings   Food Insecurity: No Food Insecurity (08/30/2022)  Housing: Low Risk  (08/30/2022)  Transportation Needs: No Transportation Needs (08/30/2022)  Utilities: Not At Risk (08/30/2022)  Tobacco Use: Medium Risk (08/31/2022)   SDOH Interventions:     Readmission Risk Interventions     No data to display

## 2022-09-01 NOTE — Consult Note (Signed)
Rebecca Wolf is a 89 y.o. female  [id number]  Primary Cardiologist: Dr. Jethro Bastos Reason for Consultation: CHF and nonsustained V. tach  HPI: This is a 89 year old African-American female with history of congestive heart failure due to HFrEF with left ventricular ejection fraction on echo recently done at [facility] 15%, hypertension and paroxysmal atrial fibrillation presented to the hospital with shortness of breath.  I was asked to evaluate the patient today because patient had nonsustained ventricular tachycardia of 6 beats.  Patient had hypomagnesemia and had an episode also today of seizures but had her medications Keppra being tapered off.   Review of Systems: No chest pain   Past Medical History:  Diagnosis Date   Asthma    Breast cancer ([place]) 2003   LT LUMPECTOMY   Breast cancer ([place]) 2004   LT LUMPECTOMY   Bronchiectasis ([place])    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   GERD (gastroesophageal reflux disease)    Hemorrhoids    Hypertension    Personal history of chemotherapy 2004   BREAST CA   Personal history of radiation therapy 2003   BREAST CA   Personal history of radiation therapy 2004   BREAST CA   Polyp of colon 2002   bleeding polyp of right ascending colon   PUD (peptic ulcer disease)    Seizures ([place])    epilepsy well controlled    Medications Prior to Admission  Medication Sig Dispense Refill   acetaminophen (TYLENOL) 500 MG tablet Take 500 mg by mouth every 6 (six) hours as needed.     amiodarone (PACERONE) 200 MG tablet Take 200 mg by mouth daily.     amoxicillin-clavulanate (AUGMENTIN) 875-125 MG tablet Take 1 tablet by mouth 2 (two) times daily for 10 days. 20 tablet 0   apixaban (ELIQUIS) 2.5 MG TABS tablet Take 2.5 mg by mouth 2 (two) times daily.     azelastine (ASTELIN) 0.1 % nasal spray U 1 SPRAY IEN BID TO REPLACE PATANASE     budesonide (PULMICORT) 0.5 MG/2ML nebulizer solution Inhale 0.5 mg into the lungs 2 (two)  times daily as needed.     fluticasone (FLONASE) 50 MCG/ACT nasal spray Place 2 sprays into the nose daily.     furosemide (LASIX) 20 MG tablet Take 1 tablet (20 mg total) by mouth 2 (two) times daily for 5 days. 10 tablet 0   levETIRAcetam (KEPPRA) 750 MG tablet Take 1 tablet by mouth 2 (two) times daily.     magnesium oxide (MAG-OX) 400 (240 Mg) MG tablet Take 1 tablet by mouth 2 (two) times daily.     montelukast (SINGULAIR) 10 MG tablet Take 10 mg by mouth at bedtime.     pantoprazole (PROTONIX) 40 MG tablet Take 1 tablet (40 mg total) by mouth daily.     potassium chloride SA (KLOR-CON M) 20 MEQ tablet Take 1 tablet (20 mEq total) by mouth 2 (two) times daily for 5 days. 10 tablet 0   predniSONE (DELTASONE) 5 MG tablet Take 10 mg by mouth daily with breakfast.     spironolactone (ALDACTONE) 25 MG tablet Take 25 mg by mouth daily.     albuterol (ACCUNEB) 1.25 MG/3ML nebulizer solution Inhale 1 ampule into the lungs 4 (four) times daily as needed. (Patient not taking: Reported on 08/30/2022)     COVID-19 mRNA vaccine 2023-2024 (COMIRNATY) syringe Inject into the muscle. 0.3 mL 0   influenza vaccine adjuvanted (FLUAD) 0.5 ML injection  Inject into the muscle. 0.5 mL 0   levalbuterol (XOPENEX) 0.63 MG/3ML nebulizer solution VVN Q 4 H PRN FOR WHZ (Patient not taking: Reported on 08/30/2022)     predniSONE (DELTASONE) 2.5 MG tablet Take 2.5 mg by mouth daily with breakfast. (Patient not taking: Reported on 08/30/2022)        [START ON 09/02/2022] amiodarone  100 mg Oral Daily   apixaban  2.5 mg Oral BID   ascorbic acid  500 mg Oral Daily   budesonide  0.5 mg Inhalation BID   feeding supplement (GLUCERNA SHAKE)  237 mL Oral TID BM   fluticasone  2 spray Each Nare Daily   furosemide  20 mg Intravenous Q12H   insulin aspart  0-5 Units Subcutaneous QHS   insulin aspart  0-9 Units Subcutaneous TID WC   losartan  25 mg Oral Daily   montelukast  10 mg Oral QHS   multivitamin with minerals  1 tablet Oral  Daily   pantoprazole  40 mg Oral Daily   sodium chloride flush  3 mL Intravenous Q12H   spironolactone  25 mg Oral Daily   zinc sulfate  220 mg Oral Daily    Infusions:  levETIRAcetam 750 mg (09/01/22 1427)   magnesium sulfate bolus IVPB 2 g (09/01/22 1738)    Allergies  Allergen Reactions   Levofloxacin Shortness Of Breath   Sulfa Antibiotics Rash    Social History   Socioeconomic History   Marital status: Married    Spouse name: Not on file   Number of children: Not on file   Years of education: Not on file   Highest education level: Not on file  Occupational History   Not on file  Tobacco Use   Smoking status: Former   Smokeless tobacco: Never  Vaping Use   Vaping Use: Never used  Substance and Sexual Activity   Alcohol use: No    Alcohol/week: 0.0 standard drinks of alcohol   Drug use: No   Sexual activity: Not Currently  Other Topics Concern   Not on file  Social History Narrative   Not on file   Social Determinants of Health   Financial Resource Strain: Not on file  Food Insecurity: No Food Insecurity (08/30/2022)   Hunger Vital Sign    Worried About Running Out of Food in the Last Year: Never true    Ran Out of Food in the Last Year: Never true  Transportation Needs: No Transportation Needs (08/30/2022)   PRAPARE - Hydrologist (Medical): No    Lack of Transportation (Non-Medical): No  Physical Activity: Not on file  Stress: Not on file  Social Connections: Not on file  Intimate Partner Violence: Not At Risk (08/30/2022)   Humiliation, Afraid, Rape, and Kick questionnaire    Fear of Current or Ex-Partner: No    Emotionally Abused: No    Physically Abused: No    Sexually Abused: No    Family History  Problem Relation Age of Onset   Alcoholism Father    Diabetes Sister    Breast cancer Neg Hx     PHYSICAL EXAM: Vitals:   09/01/22 1315 09/01/22 1645  BP: (!) 144/59 (!) 109/58  Pulse: (!) 48 (!) 45  Resp: 16 16   Temp: 97.8 F (36.6 C) 98 F (36.7 C)  SpO2: 94% 91%     Intake/Output Summary (Last 24 hours) at 09/01/2022 1826 Last data filed at 09/01/2022 0456 Gross per 24 hour  Intake 3 ml  Output 425 ml  Net -422 ml    General:  Well appearing. No respiratory difficulty HEENT: normal Neck: supple. no JVD. Carotids 2+ bilat; no bruits. No lymphadenopathy or thryomegaly appreciated. Cor: PMI nondisplaced. Regular rate & rhythm. No rubs, gallops or murmurs. Lungs: clear Abdomen: soft, nontender, nondistended. No hepatosplenomegaly. No bruits or masses. Good bowel sounds. Extremities: no cyanosis, clubbing, rash, edema Neuro: alert & oriented x 3, cranial nerves grossly intact. moves all 4 extremities w/o difficulty. Affect pleasant.  ECG: Sinus rhythm with first-degree AV block and nonspecific interventricular conduction delay with frequent PVCs  Results for orders placed or performed during the hospital encounter of 08/30/22 (from the past 24 hour(s))  Glucose, capillary     Status: Abnormal   Collection Time: 08/31/22  8:54 PM  Result Value Ref Range   Glucose-Capillary 167 (H) 70 - 99 mg/dL  Basic metabolic panel     Status: Abnormal   Collection Time: 09/01/22  4:34 AM  Result Value Ref Range   Sodium 139 135 - 145 mmol/L   Potassium 3.4 (L) 3.5 - 5.1 mmol/L   Chloride 105 98 - 111 mmol/L   CO2 25 22 - 32 mmol/L   Glucose, Bld 106 (H) 70 - 99 mg/dL   BUN 15 8 - 23 mg/dL   Creatinine, Ser 0.98 0.44 - 1.00 mg/dL   Calcium 8.1 (L) 8.9 - 10.3 mg/dL   GFR, Estimated 53 (L) >60 mL/min   Anion gap 9 5 - 15  Magnesium     Status: Abnormal   Collection Time: 09/01/22  4:34 AM  Result Value Ref Range   Magnesium 1.3 (L) 1.7 - 2.4 mg/dL  Glucose, capillary     Status: Abnormal   Collection Time: 09/01/22  8:57 AM  Result Value Ref Range   Glucose-Capillary 134 (H) 70 - 99 mg/dL  Glucose, capillary     Status: Abnormal   Collection Time: 09/01/22 11:55 AM  Result Value Ref Range    Glucose-Capillary 126 (H) 70 - 99 mg/dL  Glucose, capillary     Status: Abnormal   Collection Time: 09/01/22  5:12 PM  Result Value Ref Range   Glucose-Capillary 160 (H) 70 - 99 mg/dL   No results found.   ASSESSMENT AND PLAN: #1  congestive heart failure due to HFrEF with recent echocardiogram at [facility] showing ejection fraction 15%.  Advise adding losartan 25 mg once a day.  Continue Lasix IV 20 twice daily along with spironolactone.  Will repeat echocardiogram over here and make further recommendation. #2 history of chronic atrial fibrillation and appears to be on 200 of amiodarone daily which was held this morning because of bradycardia and first-degree AV block.  Once magnesium has been replaced along with potassium may start amiodarone 100 mg p.o. once a day. #3 electrolyte abnormalities including hypomagnesemia and hypokalemia.  Agree with giving  total 6 g of magnesium and also replace potassium.  Thank you much for follow-up. Helaina Stefano Humphrey Rolls

## 2022-09-01 NOTE — Evaluation (Signed)
Occupational Therapy Evaluation Patient Details Name: Rebecca Wolf MRN: [id number] DOB: [birth year] Today's Date: 09/01/2022   History of Present Illness Pt is a 89 y/o female admitted secondary to SOB and found to have a CHF exacerbation. IV lasix was initiated. PMH including but not limited to breast CA, HTN and seizures.   Clinical Impression   Patient/daughter agreeable to OT/PT co-treatment to maximize safety and participation. Pt presenting with decreased independence in self care, balance, functional mobility/transfers, endurance, and safety awareness. PTA pt was able to ambulate very short distances within her home with use of a rollator and required assistance from daughter with all ADLs/IADLs and transfers. Pt currently functioning at Min-Mod A +2 for bed mobility, Min A +2 for simulated toilet transfer, and Min A +2 for attempting 1-2 lateral steps at EOB using RW. Pt demonstrating poor insight and safety awareness. She required multimodal cues for sequencing/initiation of mobility this date. Evaluation limited this date 2/2 extreme anxiousness upon standing at EOB. Pt will benefit from acute OT to increase overall independence in the areas of ADLs and functional mobility in order to safely discharge to next venue of care. OT recommends ongoing therapy upon discharge to maximize safety and independence with ADLs, functional mobility, decrease fall risk, and decrease caregiver burden.      Recommendations for follow up therapy are one component of a multi-disciplinary discharge planning process, led by the attending physician.  Recommendations may be updated based on patient status, additional functional criteria and insurance authorization.   Follow Up Recommendations  Skilled nursing-short term rehab (<3 hours/day)     Assistance Recommended at Discharge Frequent or constant Supervision/Assistance  Patient can return home with the following A lot of help with  bathing/dressing/bathroom;Assistance with cooking/housework;Assist for transportation;Help with stairs or ramp for entrance;Two people to help with walking and/or transfers    Functional Status Assessment  Patient has had a recent decline in their functional status and demonstrates the ability to make significant improvements in function in a reasonable and predictable amount of time.  Equipment Recommendations  Other (comment) (defer to next venue of care)    Recommendations for Other Services       Precautions / Restrictions Precautions Precautions: Fall Precaution Comments: gets incredibly anxious with mobility Restrictions Weight Bearing Restrictions: No      Mobility Bed Mobility Overal bed mobility: Needs Assistance Bed Mobility: Supine to Sit, Sit to Supine     Supine to sit: Min assist, +2 for physical assistance, HOB elevated Sit to supine: Mod assist, +2 for physical assistance   General bed mobility comments: increased time/effort, Max multimodal cues for sequencing/technique, assistance required for trunk and BLE management    Transfers Overall transfer level: Needs assistance Equipment used: Rolling walker (2 wheels) Transfers: Sit to/from Stand Sit to Stand: Min assist, +2 physical assistance, +2 safety/equipment           General transfer comment: STS 2x from EOB      Balance Overall balance assessment: Needs assistance Sitting-balance support: Feet supported Sitting balance-Leahy Scale: Fair     Standing balance support: Bilateral upper extremity supported, Reliant on assistive device for balance Standing balance-Leahy Scale: Poor                             ADL either performed or assessed with clinical judgement   ADL Overall ADL's : Needs assistance/impaired  Upper Body Dressing : Minimal assistance;Sitting   Lower Body Dressing: Maximal assistance;Bed level Lower Body Dressing Details (indicate cue type  and reason): socks Toilet Transfer: Minimal assistance;+2 for physical assistance;+2 for safety/equipment;Rolling walker (2 wheels) Toilet Transfer Details (indicate cue type and reason): simulated         Functional mobility during ADLs: Minimal assistance;+2 for physical assistance;Rolling walker (2 wheels) (to take 1-2 lateral steps at EOB, limited due to extreme anxiety)       Vision Patient Visual Report: No change from baseline       Perception     Praxis      Pertinent Vitals/Pain Pain Assessment Pain Assessment: No/denies pain     Hand Dominance     Extremity/Trunk Assessment Upper Extremity Assessment Upper Extremity Assessment: Generalized weakness   Lower Extremity Assessment Lower Extremity Assessment: Generalized weakness   Cervical / Trunk Assessment Cervical / Trunk Assessment: Kyphotic   Communication Communication Communication: No difficulties   Cognition Arousal/Alertness: Awake/alert Behavior During Therapy: Anxious Overall Cognitive Status: Impaired/Different from baseline Area of Impairment: Problem solving, Safety/judgement, Awareness                         Safety/Judgement: Decreased awareness of deficits, Decreased awareness of safety Awareness: Intellectual Problem Solving: Requires verbal cues, Requires tactile cues General Comments: Poor insight, safety awareness. Pt very adamant about wanting to return home.     General Comments       Exercises Other Exercises Other Exercises: OT provided education re: role of OT, OT POC, post acute recs, sitting up for all meals, EOB/OOB mobility with assistance, home/fall safety.     Shoulder Instructions      Home Living Family/patient expects to be discharged to:: Private residence Living Arrangements: Children Available Help at Discharge: Family;Available 24 hours/day Type of Home: House Home Access: Ramped entrance     Home Layout: One level               Home  Equipment: Rollator (4 wheels);BSC/3in1;Electric scooter   Additional Comments: pt's daughter is primary caregiver and present during eval. She expressed her concerns with feeling as though she is no longer able to properly care for her mother at home and would like for pt to d/c to SNF for short-term rehab to build up her strength prior to returning home      Prior Functioning/Environment Prior Level of Function : Needs assist       Physical Assist : ADLs (physical);Mobility (physical)     Mobility Comments: ambulates very short distances with rollator; requires physical assistance for transfers ADLs Comments: Daughter provides assistance for all ADLs/IADLs. Has PCA/aide for a couple hours/day, just helping with household chores per daughter.        OT Problem List: Decreased strength;Decreased range of motion;Decreased activity tolerance;Impaired balance (sitting and/or standing);Decreased cognition;Decreased safety awareness      OT Treatment/Interventions: Self-care/ADL training;Therapeutic exercise;Neuromuscular education;Energy conservation;DME and/or AE instruction;Manual therapy;Modalities;Balance training;Patient/family education;Visual/perceptual remediation/compensation;Cognitive remediation/compensation;Therapeutic activities;Splinting    OT Goals(Current goals can be found in the care plan section) Acute Rehab OT Goals Patient Stated Goal: return home OT Goal Formulation: With patient/family Time For Goal Achievement: 09/15/22 Potential to Achieve Goals: Fair   OT Frequency: Min 2X/week    Co-evaluation PT/OT/SLP Co-Evaluation/Treatment: Yes Reason for Co-Treatment: For patient/therapist safety;To address functional/ADL transfers PT goals addressed during session: Mobility/safety with mobility;Balance;Proper use of DME;Strengthening/ROM OT goals addressed during session: ADL's and self-care;Proper use of Adaptive equipment and  DME;Strengthening/ROM      AM-PAC OT  "6 Clicks" Daily Activity     Outcome Measure Help from another person eating meals?: A Little Help from another person taking care of personal grooming?: A Little Help from another person toileting, which includes using toliet, bedpan, or urinal?: A Lot Help from another person bathing (including washing, rinsing, drying)?: A Lot Help from another person to put on and taking off regular upper body clothing?: A Little Help from another person to put on and taking off regular lower body clothing?: A Lot 6 Click Score: 15   End of Session Equipment Utilized During Treatment: Gait belt;Rolling walker (2 wheels) Nurse Communication: Mobility status  Activity Tolerance:   Patient left: in bed;with call bell/phone within reach;with bed alarm set;with family/visitor present;Other (comment) (MD present at end of session)  OT Visit Diagnosis: Other abnormalities of gait and mobility (R26.89);Muscle weakness (generalized) (M62.81)                Time: [id number] OT Time Calculation (min): 18 min Charges:  OT General Charges $OT Visit: 1 Visit OT Evaluation $OT Eval Moderate Complexity: 1 Mod  [facility] MS, OTR/L ascom [phone]  09/01/22, 2:11 PM

## 2022-09-01 NOTE — NC FL2 (Signed)
[place] LEVEL OF CARE FORM     IDENTIFICATION  Patient Name: Rebecca Wolf Birthdate: May 15, 1925 Sex: female Admission Date (Current Location): 08/30/2022  [facility] and [place] Number:  Engineering geologist and Address:  [facility], [address] [place], [place] [address]      Provider Number: [id number]  Attending Physician Name and Address:  Verline Lema, MD  Relative Name and Phone Number:  Rebecca, Wolf (Daughter)  [phone]    Current Level of Care: Hospital Recommended Level of Care: [place] Prior Approval Number:    Date Approved/Denied:   PASRR Number: [id number]  Discharge Plan: SNF    Current Diagnoses: Patient Active Problem List   Diagnosis Date Noted   Electrolyte abnormality 08/30/2022   SOB (shortness of breath) 08/30/2022   Acute blood loss anemia 01/16/2022   Overweight (BMI 25.0-29.9) [id number]   Chronic systolic CHF (congestive heart failure) ([place]) 01/16/2022   Hypomagnesemia    Leukocytosis    GIB (gastrointestinal bleeding) 01/15/2022   Pressure injury of skin 01/15/2022   B12 deficiency 10/17/2019   Folate deficiency 10/17/2019   Anemia 08/29/2019   Iron deficiency anemia 08/26/2019   Chronic anticoagulation 10/25/2018   CKD stage G3b/A1, GFR 30-44 and albumin creatinine ratio <30 mg/g (HCC) 10/05/2018   MGUS (monoclonal gammopathy of unknown significance) 07/23/2018   Osteopenia after menopause 07/16/2018   Diabetes mellitus with no complication ([place]) [id number]   Vitamin D deficiency 04/07/2018   Heart palpitations 01/06/2018   Tachycardia 01/06/2018   Cystocele with rectocele 10/30/2017   Rectocele 10/30/2017   Microcytic red blood cells 09/23/2017   Localized edema 02/13/2017   Hemorrhoids 05/17/2016   Obesity (BMI 30-39.9) 04/26/2016   Multiple lung nodules 04/19/2016   Chest pain 04/17/2016   Partial symptomatic epilepsy with complex partial seizures, not  intractable, without status epilepticus ([place]) 06/30/2015   Bronchiectasis without complication ([place]) [id number]   Imbalance 11/08/2014   Hypertension 11/08/2014   Personal history of malignant neoplasm of breast 11/08/2014   History of malignant carcinoid tumor of bronchus and lung 11/08/2014   H/O peptic ulcer 11/08/2014   Asthma, mild intermittent 11/08/2014   Pulmonary embolism ([place]) 11/08/2014   Gonalgia 11/08/2014   Seizure disorder ([place]) 11/08/2014   Leg varices 11/08/2014    Orientation RESPIRATION BLADDER Height & Weight     Self, Time, Situation, Place  Normal Continent Weight: 157 lb 13.6 oz (71.6 kg) Height:  '5\' 4"'$  (162.6 cm)  BEHAVIORAL SYMPTOMS/MOOD NEUROLOGICAL BOWEL NUTRITION STATUS    Convulsions/Seizures (Patient has a history of Epilepsy. Last seizure was 2019 before today) Continent Diet (Regular. Please see discharge summary)  AMBULATORY STATUS COMMUNICATION OF NEEDS Skin   Extensive Assist Verbally PU Stage and Appropriate Care (sacrum wound)                       Personal Care Assistance Level of Assistance  Bathing, Feeding, Dressing Bathing Assistance: Maximum assistance Feeding assistance: Independent Dressing Assistance: Maximum assistance     Functional Limitations Info  Sight, Hearing, Speech Sight Info: Adequate Hearing Info: Impaired (Hearing aides) Speech Info: Adequate    SPECIAL CARE FACTORS FREQUENCY  PT (By licensed PT), OT (By licensed OT)     PT Frequency: MIN 2X WEEKLY OT Frequency: MIN 2X WEEKLY            Contractures Contractures Info: Not present    Additional Factors Info  Code Status, Allergies Code Status Info: Full  Allergies Info: Levofloxacin, Sulfa Antibiotics           Current Medications (09/01/2022):  This is the current hospital active medication list Current Facility-Administered Medications  Medication Dose Route Frequency Provider Last Rate Last Admin   acetaminophen (TYLENOL) tablet 650 mg  650  mg Oral Q6H PRN Para Skeans, MD   650 mg at 08/31/22 2103   Or   acetaminophen (TYLENOL) suppository 650 mg  650 mg Rectal Q6H PRN Para Skeans, MD       [START ON 09/02/2022] amiodarone (PACERONE) tablet 100 mg  100 mg Oral Daily Verline Lema, MD       apixaban Arne Cleveland) tablet 2.5 mg  2.5 mg Oral BID Florina Ou V, MD   2.5 mg at 09/01/22 0900   ascorbic acid (VITAMIN C) tablet 500 mg  500 mg Oral Daily Oran Rein, MD   500 mg at 09/01/22 0900   bisacodyl (DULCOLAX) EC tablet 5 mg  5 mg Oral Daily PRN Para Skeans, MD       budesonide (PULMICORT) nebulizer solution 0.5 mg  0.5 mg Inhalation BID Sharion Settler, NP   0.5 mg at 09/01/22 0804   feeding supplement (GLUCERNA SHAKE) (GLUCERNA SHAKE) liquid 237 mL  237 mL Oral TID BM Oran Rein, MD   237 mL at 09/01/22 1309   fluticasone (FLONASE) 50 MCG/ACT nasal spray 2 spray  2 spray Each Nare Daily Para Skeans, MD   2 spray at 09/01/22 0901   furosemide (LASIX) injection 20 mg  20 mg Intravenous Q12H Para Skeans, MD   20 mg at 09/01/22 0901   hydrALAZINE (APRESOLINE) injection 5 mg  5 mg Intravenous Q4H PRN Para Skeans, MD       insulin aspart (novoLOG) injection 0-5 Units  0-5 Units Subcutaneous QHS Florina Ou V, MD       insulin aspart (novoLOG) injection 0-9 Units  0-9 Units Subcutaneous TID WC Para Skeans, MD       levETIRAcetam (KEPPRA) 750 mg in sodium chloride 0.9 % 100 mL IVPB  750 mg Intravenous Q12H Marguerita Merles T, MD 430 mL/hr at 09/01/22 1427 750 mg at 09/01/22 1427   losartan (COZAAR) tablet 25 mg  25 mg Oral Daily Marguerita Merles T, MD   25 mg at 09/01/22 1424   magnesium sulfate IVPB 2 g 50 mL  2 g Intravenous Q2H Marguerita Merles T, MD 50 mL/hr at 09/01/22 1537 2 g at 09/01/22 1537   montelukast (SINGULAIR) tablet 10 mg  10 mg Oral QHS Para Skeans, MD   10 mg at 08/31/22 2103   multivitamin with minerals tablet 1 tablet  1 tablet Oral Daily Oran Rein, MD   1 tablet at 09/01/22 0901   pantoprazole (PROTONIX) EC  tablet 40 mg  40 mg Oral Daily Florina Ou V, MD   40 mg at 09/01/22 0900   polyethylene glycol (MIRALAX / GLYCOLAX) packet 17 g  17 g Oral Daily PRN Para Skeans, MD       potassium chloride SA (KLOR-CON M) CR tablet 40 mEq  40 mEq Oral Q4H Marguerita Merles T, MD   40 mEq at 09/01/22 1303   sodium chloride flush (NS) 0.9 % injection 3 mL  3 mL Intravenous Q12H Florina Ou V, MD   3 mL at 09/01/22 0901   spironolactone (ALDACTONE) tablet 25 mg  25 mg Oral Daily Verline Lema, MD   25 mg at 09/01/22  1422   zinc sulfate capsule 220 mg  220 mg Oral Daily Oran Rein, MD   220 mg at 09/01/22 0900     Discharge Medications: Please see discharge summary for a list of discharge medications.  Relevant Imaging Results:  Relevant Lab Results:   Additional Information [id number]  Raina Mina, LCSWA

## 2022-09-01 NOTE — Consult Note (Signed)
Neurology Consultation Reason for Consult: Seizure Referring Physician: Pershing Proud  CC: Seizure  History is obtained from: Patient, daughter  HPI: Rebecca Wolf is a 89 y.o. female with a history of seizures who has been on Keppra 750 twice daily since 2001.  She has been having some anxiety, and out of concern that [place] could be contributing in the five that she had not had a seizure in quite some years the decision was made to wean her off of it.  She was admitted on 03/01 with worsening shortness of breath, CHF.  Unfortunately she had a breakthrough seizure this afternoon which the daughter describes as lasting for approximately 40 seconds.  She had a few minutes of confusion afterwards, but is now completely back to her normal self.   Past Medical History:  Diagnosis Date   Asthma    Breast cancer ([place]) 2003   LT LUMPECTOMY   Breast cancer ([place]) 2004   LT LUMPECTOMY   Bronchiectasis ([place])    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   GERD (gastroesophageal reflux disease)    Hemorrhoids    Hypertension    Personal history of chemotherapy 2004   BREAST CA   Personal history of radiation therapy 2003   BREAST CA   Personal history of radiation therapy 2004   BREAST CA   Polyp of colon 2002   bleeding polyp of right ascending colon   PUD (peptic ulcer disease)    Seizures ([place])    epilepsy well controlled     Family History  Problem Relation Age of Onset   Alcoholism Father    Diabetes Sister    Breast cancer Neg Hx      Social History:  reports that she has quit smoking. She has never used smokeless tobacco. She reports that she does not drink alcohol and does not use drugs.   Exam: Current vital signs: BP (!) 144/59 (BP Location: Right Arm)   Pulse (!) 48   Temp 97.8 F (36.6 C)   Resp 16   Ht '5\' 4"'$  (1.626 m)   Wt 71.6 kg   SpO2 94%   BMI 27.09 kg/m  Vital signs in last 24 hours: Temp:  [97.4 F (36.3 C)-98.3 F (36.8 C)] 97.8 F (36.6 C)  (03/03 1315) Pulse Rate:  [40-50] 48 (03/03 1315) Resp:  [16-18] 16 (03/03 1315) BP: (122-144)/(44-67) 144/59 (03/03 1315) SpO2:  [90 %-100 %] 94 % (03/03 1315) Weight:  [71.6 kg] 71.6 kg (03/03 0500)   Physical Exam  Appears well-developed and well-nourished.   Neuro: Mental Status: Patient is awake, alert, oriented to person, place, month, year, and situation. Patient is able to give a clear and coherent history. No signs of aphasia or neglect Cranial Nerves: II: Visual Fields are full. Pupils are equal, round, and reactive to light.   III,IV, VI: EOMI without ptosis or diploplia.  V: Facial sensation is symmetric to temperature VII: Facial movement is symmetric.  VIII: hearing is intact to voice X: Uvula elevates symmetrically XI: Shoulder shrug is symmetric. XII: tongue is midline without atrophy or fasciculations.  Motor: She has symmetric strength, with at least 4+/5 strength throughout Sensory: Sensation is symmetric to light touch and temperature in the arms and legs. Cerebellar: No definite ataxia     I have reviewed labs in epic and the results pertinent to this consultation are: Magnesium-1.3  I have reviewed the images obtained: CT head from 2018-better than expected atrophy for age  Impression: 89 year old female with longstanding epilepsy with breakthrough seizure in the setting of hypomagnesemia and reducing Keppra.  Given that her seizure was typical for her previous seizures, doubt it was purely hypomagnesemic.  I would favor reinstating her previous dose of Keppra since she tolerated this fairly well for many years.  Could consider reduction to 500 twice daily at some point in the future, but would favor while she is still acutely ill in the hospital reinstating her previous dose.  Recommendations: 1) reinstate Keppra 750 twice daily 2) replete hypomagnesemia 3) follow-up with outpatient neurology 4) please reconsult neurology if any further seizures  occur.   Roland Rack, MD [facility] [phone]  If 7pm- 7am, please page neurology on call as listed in [place].

## 2022-09-01 NOTE — Progress Notes (Addendum)
Progress Note   Patient: Rebecca Wolf [id number] DOB: 07-14-1924 DOA: 08/30/2022     0 DOS: the patient was seen and examined on 09/01/2022   Subjective: Patient seen and examined at bedside this morning in the presence of patient's daughter According to the daughter patient became short of breath and was also anxious subsequently brought in for evaluation According to the daughter she is having difficulty taking care of her mom and for the last 1 year she has not been able to have adequate sleep Denies nausea vomiting chest pain cough or urinary complaints  Brief hospital course: 89 years old female with medical history of hypertension, seizure disorder, CHF with EF of 20% and GERD Presented to the ED with complaint of shortness of breath.  Patient reports shortness of breath lasted about 30 minutes and then resolved.  Patient was asymptomatic on presentation in the ED.  As per daughter patient momentarily gets short of breath when she has to use the restroom in the heat of the event patient gets anxious and short of breath.  No complaint of associated chest pain, diaphoresis, headache, blurring of vision, palpitation, abdominal pain, dysuria and diarrhea.  No sick contacts or recent travel.  3/2 : Patient was bradycardic on amiodarone 200 mg daily.  Patient with no documentation of arrhythmia.  Was tachycardic on presentation which improved in the ED close now with bradycardia.   3/3: patient had an episode of seizure.  This was witnessed by patient's daughter in the room.  According to her neurologist was going inpatient third dose of Keppra.  We will give loading dose of Keppra in the chest patient's maintenance dose at discharge.  assessment and Plan: * SOB (shortness of breath) secondary to acute on Chronic systolic CHF (congestive heart failure)   Most recent 2DEcho last Thursday shows EF of 15% at [place] C/W reduced ejection fraction heart failure.  Patient currently has symptoms  of acute on chronic heart failure exacerbation as evidenced by orthopnea, dyspnea and crackles on examination Continue spironolactone, losartan Holding beta-blockers at this time on account of bradycardia Continue current diuresis Resume meds as tolerated.  Strict I/O. Daily weight   Acute on chronic seizure disorder  At home patient takes Keppra 375 mg bid. Outpatient neurologist have been trying to open when head of Keppra so dose was subsequently decreased by her outpatient neurologist Patient had witnessed seizure on 09/01/2022 We will keep patient on IV Keppra 750 mg twice daily and hopefully at discharge continue on same dose Seizure precaution.  Neurologist has been consulted   Hypomagnesemia Hypokalemia Replace and follow levels.   Possible arrhythmia Patient had nonsustained V. tach on 09/01/2022 Dose of amiodarone have been decreased on account of bradycardia Patient noted to have an episode of torsades on telemetry Stat EKG has been requested On-call cardiologist has been informed and consult has been placed. Initial recommendations discussed with cardiologist over the phone to continue with magnesium replacement  Normocytic anemia No indication for transfusion at this time Continue to monitor CBC  Diabetes mellitus with no complications Continue to monitor glucose level Continue current insulin regiment  Pulmonary embolism  Daughter said pt has h/o VTE and is on eliquis.  CTA neg for PE Will cont eliquis.         Physical Exam: Vitals:   09/01/22 0804 09/01/22 0846 09/01/22 1235 09/01/22 1315  BP:  135/63 (!) 144/67 (!) 144/59  Pulse:  (!) 48 (!) 50 (!) 48  Resp:  16  16  Temp:  98.3 F (36.8 C) 98.1 F (36.7 C) 97.8 F (36.6 C)  TempSrc:   Oral   SpO2: 95% 97% 100% 94%  Weight:      Height:       Physical Exam HENT:     Head: Normocephalic.  Eyes:     Pupils: Pupils are equal, round, and reactive to light.  Cardiovascular:     Rate and  Rhythm: Bradycardia present.     Pulses: Normal pulses.  Pulmonary:     Crackles on pulmonary exam bilaterally especially at the bases  Abdominal:     General: Bowel sounds are normal.     Palpations: Abdomen is soft.  Musculoskeletal:        General: Normal range of motion.  Skin:    General: Skin is warm.  Neurological:     General: No focal deficit present.     Mental Status: She is alert but appears anxious   Data Reviewed: I have reviewed the patient's CBC, BMP as well as magnesium levels Patient found to have low magnesium as well as low potassium which is being repleted  Family Communication: Plan of care discussed with patient, nursing staff as well as patient's daughter present at bedside  Disposition: Status is: Inpatient Inpatient justified as the appropriate status for this patient on account of being in active heart failure as well as having had a witnessed seizure IV antiseizure medications.  Planned Discharge Destination: Rehab/skilled nursing facility when medically stable   Time spent: 55 minutes  Author: Verline Lema, MD 09/01/2022 1:29 PM  For on call review [email or web address].

## 2022-09-01 NOTE — Evaluation (Signed)
Physical Therapy Evaluation Patient Details Name: Rebecca Wolf MRN: [id number] DOB: 09/18/1924 Today's Date: 09/01/2022  History of Present Illness  Pt is a 89 y/o female admitted secondary to SOB and found to have a CHF exacerbation. IV lasix was initiated. PMH including but not limited to breast CA, HTN and seizures.   Clinical Impression  Pt presented supine in bed with HOB elevated, awake and willing to participate in therapy session. Pt's daughter was present throughout entire session. Prior to admission, pt was able to ambulate very short distances within her home with use of a rollator and required assistance from daughter with all ADLs/IADLs and transfers. Pt lives with her daughter in a single level home with a ramped entrance. Her daughter expressed concerns regarding her ability to continue to care for her mother at home at this time and would like for pt to d/c to SNF for short-term rehab. However, pt very adamant about returning home. At the time of evaluation, pt required mod A x2 for bed mobility, min A x2 for transfers with RW and min A x2 for 1-2 side steps at EOB with use of RW. Pt significantly limited in mobility secondary to anxiety with any movement (particularly standing). Pt would continue to benefit from skilled physical therapy services at this time while admitted and after d/c to address the below listed limitations in order to improve overall safety and independence with functional mobility.     Recommendations for follow up therapy are one component of a multi-disciplinary discharge planning process, led by the attending physician.  Recommendations may be updated based on patient status, additional functional criteria and insurance authorization.  Follow Up Recommendations Skilled nursing-short term rehab (<3 hours/day) Can patient physically be transported by private vehicle: No    Assistance Recommended at Discharge Frequent or constant Supervision/Assistance  Patient  can return home with the following  A lot of help with walking and/or transfers;Two people to help with walking and/or transfers;A lot of help with bathing/dressing/bathroom;Assistance with cooking/housework;Assist for transportation;Help with stairs or ramp for entrance    Equipment Recommendations Other (comment) (defer to next venue of care)  Recommendations for Other Services       Functional Status Assessment Patient has had a recent decline in their functional status and demonstrates the ability to make significant improvements in function in a reasonable and predictable amount of time.     Precautions / Restrictions Precautions Precautions: Fall Precaution Comments: gets incredibly anxious with mobility Restrictions Weight Bearing Restrictions: No      Mobility  Bed Mobility Overal bed mobility: Needs Assistance Bed Mobility: Supine to Sit, Sit to Supine     Supine to sit: Min assist, +2 for physical assistance, HOB elevated Sit to supine: Mod assist, +2 for physical assistance   General bed mobility comments: increased time and effort needed, multimodal cueing for sequencing and technique, assistance needed for bilateral LE movement off of and back onto bed, assistance needed for trunk management as well    Transfers Overall transfer level: Needs assistance Equipment used: Rolling walker (2 wheels) Transfers: Sit to/from Stand Sit to Stand: Min assist, +2 physical assistance, +2 safety/equipment           General transfer comment: pt using bilateral UEs to pull on RW as she would with her rollator that she uses at home, min A for stabilization of the RW and min A x2 to power up into standing and for stability due to pt increasing anxiety with movement. pt  was able to complete sit<>stand transfers from EOB x2    Ambulation/Gait               General Gait Details: pt only able to tolerate taking 1-2 side steps at EOB towards [facility] with min A x2 and RW due to  extreme anxiety with movement  Stairs            Wheelchair Mobility    Modified Rankin (Stroke Patients Only)       Balance Overall balance assessment: Needs assistance Sitting-balance support: Feet supported Sitting balance-Leahy Scale: Fair     Standing balance support: Bilateral upper extremity supported, Reliant on assistive device for balance Standing balance-Leahy Scale: Poor                               Pertinent Vitals/Pain Pain Assessment Pain Assessment: No/denies pain    Home Living Family/patient expects to be discharged to:: Private residence Living Arrangements: Children Available Help at Discharge: Family;Available 24 hours/day Type of Home: House Home Access: Ramped entrance       Home Layout: One level Home Equipment: Rollator (4 wheels);BSC/3in1;Electric scooter Additional Comments: pt's daughter is primary caregiver and present during eval. She expressed her concerns with feeling as though she is no longer able to properly care for her mother at home and would like for pt to d/c to SNF for short-term rehab to build up her strength prior to returning home    Prior Function Prior Level of Function : Needs assist       Physical Assist : ADLs (physical);Mobility (physical)     Mobility Comments: ambulates very short distances with rollator; requires physical assistance for transfers ADLs Comments: daughter assists with bathing and dressing     Hand Dominance        Extremity/Trunk Assessment   Upper Extremity Assessment Upper Extremity Assessment: Defer to OT evaluation    Lower Extremity Assessment Lower Extremity Assessment: Generalized weakness    Cervical / Trunk Assessment Cervical / Trunk Assessment: Kyphotic  Communication   Communication: No difficulties  Cognition Arousal/Alertness: Awake/alert Behavior During Therapy: Anxious Overall Cognitive Status: Impaired/Different from baseline Area of Impairment:  Problem solving, Safety/judgement, Awareness                         Safety/Judgement: Decreased awareness of deficits, Decreased awareness of safety Awareness: Intellectual Problem Solving: Requires verbal cues, Requires tactile cues General Comments: pt very adamant about wanting to return home despite her difficulties with very limited mobility        General Comments      Exercises     Assessment/Plan    PT Assessment Patient needs continued PT services  PT Problem List Decreased strength;Decreased range of motion;Decreased activity tolerance;Decreased balance;Decreased mobility;Decreased coordination;Decreased knowledge of use of DME;Decreased safety awareness;Decreased knowledge of precautions       PT Treatment Interventions DME instruction;Gait training;Stair training;Functional mobility training;Therapeutic activities;Therapeutic exercise;Balance training;Neuromuscular re-education;Patient/family education    PT Goals (Current goals can be found in the Care Plan section)  Acute Rehab PT Goals Patient Stated Goal: pt wants to return home ASAP PT Goal Formulation: With patient/family Time For Goal Achievement: 09/15/22 Potential to Achieve Goals: Good    Frequency Min 2X/week     Co-evaluation PT/OT/SLP Co-Evaluation/Treatment: Yes Reason for Co-Treatment: For patient/therapist safety;To address functional/ADL transfers PT goals addressed during session: Mobility/safety with mobility;Balance;Proper use of DME;Strengthening/ROM  AM-PAC PT "6 Clicks" Mobility  Outcome Measure Help needed turning from your back to your side while in a flat bed without using bedrails?: A Little Help needed moving from lying on your back to sitting on the side of a flat bed without using bedrails?: A Lot Help needed moving to and from a bed to a chair (including a wheelchair)?: A Lot Help needed standing up from a chair using your arms (e.g., wheelchair or bedside  chair)?: A Lot Help needed to walk in hospital room?: A Lot Help needed climbing 3-5 steps with a railing? : Total 6 Click Score: 12    End of Session Equipment Utilized During Treatment: Gait belt Activity Tolerance: Other (comment) (limited secondary to significant anxiety with movement) Patient left: in bed;with call bell/phone within reach;with bed alarm set;with family/visitor present Nurse Communication: Mobility status PT Visit Diagnosis: Other abnormalities of gait and mobility (R26.89)    Time: [id number] PT Time Calculation (min) (ACUTE ONLY): 20 min   Charges:   PT Evaluation $PT Eval Moderate Complexity: 1 Mod          Eduard Clos, PT, DPT  Acute Rehabilitation Services Office [place] 09/01/2022, 10:41 AM

## 2022-09-02 ENCOUNTER — Encounter: Payer: Self-pay | Admitting: Internal Medicine

## 2022-09-02 ENCOUNTER — Inpatient Hospital Stay
Admit: 2022-09-02 | Discharge: 2022-09-02 | Disposition: A | Discharge status: Home / Self Care | Payer: 59 | Attending: Cardiovascular Disease | Admitting: Cardiovascular Disease

## 2022-09-02 ENCOUNTER — Encounter: Payer: Self-pay | Admitting: Nurse Practitioner

## 2022-09-02 DIAGNOSIS — I480 Paroxysmal atrial fibrillation: Secondary | ICD-10-CM | POA: Diagnosis present

## 2022-09-02 DIAGNOSIS — Z9221 Personal history of antineoplastic chemotherapy: Secondary | ICD-10-CM | POA: Diagnosis not present

## 2022-09-02 DIAGNOSIS — Z853 Personal history of malignant neoplasm of breast: Secondary | ICD-10-CM | POA: Diagnosis not present

## 2022-09-02 DIAGNOSIS — Z794 Long term (current) use of insulin: Secondary | ICD-10-CM | POA: Diagnosis not present

## 2022-09-02 DIAGNOSIS — J189 Pneumonia, unspecified organism: Secondary | ICD-10-CM | POA: Diagnosis present

## 2022-09-02 DIAGNOSIS — R001 Bradycardia, unspecified: Secondary | ICD-10-CM | POA: Diagnosis present

## 2022-09-02 DIAGNOSIS — E1122 Type 2 diabetes mellitus with diabetic chronic kidney disease: Secondary | ICD-10-CM | POA: Diagnosis present

## 2022-09-02 DIAGNOSIS — E876 Hypokalemia: Secondary | ICD-10-CM | POA: Diagnosis present

## 2022-09-02 DIAGNOSIS — E440 Moderate protein-calorie malnutrition: Secondary | ICD-10-CM | POA: Diagnosis present

## 2022-09-02 DIAGNOSIS — I4892 Unspecified atrial flutter: Secondary | ICD-10-CM | POA: Diagnosis present

## 2022-09-02 DIAGNOSIS — J45909 Unspecified asthma, uncomplicated: Secondary | ICD-10-CM | POA: Diagnosis present

## 2022-09-02 DIAGNOSIS — I5023 Acute on chronic systolic (congestive) heart failure: Secondary | ICD-10-CM | POA: Diagnosis present

## 2022-09-02 DIAGNOSIS — Z87891 Personal history of nicotine dependence: Secondary | ICD-10-CM | POA: Diagnosis not present

## 2022-09-02 DIAGNOSIS — Z923 Personal history of irradiation: Secondary | ICD-10-CM | POA: Diagnosis not present

## 2022-09-02 DIAGNOSIS — I482 Chronic atrial fibrillation, unspecified: Secondary | ICD-10-CM | POA: Diagnosis present

## 2022-09-02 DIAGNOSIS — G40909 Epilepsy, unspecified, not intractable, without status epilepticus: Secondary | ICD-10-CM | POA: Diagnosis present

## 2022-09-02 DIAGNOSIS — F419 Anxiety disorder, unspecified: Secondary | ICD-10-CM | POA: Diagnosis present

## 2022-09-02 DIAGNOSIS — Z79899 Other long term (current) drug therapy: Secondary | ICD-10-CM | POA: Diagnosis not present

## 2022-09-02 DIAGNOSIS — N1832 Chronic kidney disease, stage 3b: Secondary | ICD-10-CM | POA: Diagnosis present

## 2022-09-02 DIAGNOSIS — I130 Hypertensive heart and chronic kidney disease with heart failure and stage 1 through stage 4 chronic kidney disease, or unspecified chronic kidney disease: Secondary | ICD-10-CM | POA: Diagnosis present

## 2022-09-02 DIAGNOSIS — R0602 Shortness of breath: Secondary | ICD-10-CM | POA: Diagnosis present

## 2022-09-02 DIAGNOSIS — I4721 Torsades de pointes: Secondary | ICD-10-CM | POA: Diagnosis present

## 2022-09-02 DIAGNOSIS — Z7901 Long term (current) use of anticoagulants: Secondary | ICD-10-CM | POA: Diagnosis not present

## 2022-09-02 DIAGNOSIS — D631 Anemia in chronic kidney disease: Secondary | ICD-10-CM | POA: Diagnosis present

## 2022-09-02 LAB — ECHOCARDIOGRAM COMPLETE
AR max vel: 1.56 cm2
AV Area VTI: 1.62 cm2
AV Area mean vel: 1.5 cm2
AV Mean grad: 3.7 mmHg
AV Peak grad: 6.8 mmHg
Ao pk vel: 1.3 m/s
Area-P 1/2: 4.83 cm2
Calc EF: 34.5 %
Height: 64 in
S' Lateral: 4.2 cm
Single Plane A2C EF: 50.8 %
Single Plane A4C EF: 7.9 %
Weight: 2525.59 oz

## 2022-09-02 LAB — COMPREHENSIVE METABOLIC PANEL
ALT: 14 U/L (ref 0–44)
AST: 24 U/L (ref 15–41)
Albumin: 2.7 g/dL — ABNORMAL LOW (ref 3.5–5.0)
Alkaline Phosphatase: 109 U/L (ref 38–126)
Anion gap: 8 (ref 5–15)
BUN: 14 mg/dL (ref 8–23)
CO2: 27 mmol/L (ref 22–32)
Calcium: 8.4 mg/dL — ABNORMAL LOW (ref 8.9–10.3)
Chloride: 104 mmol/L (ref 98–111)
Creatinine, Ser: 0.96 mg/dL (ref 0.44–1.00)
GFR, Estimated: 54 mL/min — ABNORMAL LOW (ref >60)
Glucose, Bld: 108 mg/dL — ABNORMAL HIGH (ref 70–99)
Potassium: 4.3 mmol/L (ref 3.5–5.1)
Sodium: 139 mmol/L (ref 135–145)
Total Bilirubin: 0.3 mg/dL (ref 0.3–1.2)
Total Protein: 6.2 g/dL — ABNORMAL LOW (ref 6.5–8.1)

## 2022-09-02 LAB — GLUCOSE, CAPILLARY
Glucose-Capillary: 107 mg/dL — ABNORMAL HIGH (ref 70–99)
Glucose-Capillary: 111 mg/dL — ABNORMAL HIGH (ref 70–99)
Glucose-Capillary: 138 mg/dL — ABNORMAL HIGH (ref 70–99)
Glucose-Capillary: 106 mg/dL — ABNORMAL HIGH (ref 70–99)

## 2022-09-02 LAB — MAGNESIUM: Magnesium: 2.4 mg/dL (ref 1.7–2.4)

## 2022-09-02 LAB — HEMOGLOBIN A1C
Hgb A1c MFr Bld: 7.6 % — ABNORMAL HIGH (ref 4.8–5.6)
Mean Plasma Glucose: 171 mg/dL

## 2022-09-02 MED ORDER — VITAMIN C 500 MG PO TABS
500.0000 mg | ORAL_TABLET | Freq: Two times a day (BID) | ORAL | Status: DC
  Administered 2022-09-02 – 2022-09-06 (×8): 500 mg via ORAL
  Filled 2022-09-02 (×8): qty 1

## 2022-09-02 NOTE — Consult Note (Signed)
   Heart Failure Nurse Navigator Note  HF-echocardiogram results pending at time of consult.  Previously been reported at 15% echocardiogram performed at [facility].   She presented to the emergency room with complaints of worsening shortness of breath.   Comorbidities:  Asthma Hypertension History of breast cancer Peptic ulcer disease Seizure  Medications:  Amiodarone 100 mg daily Apixaban 2.5 mg 2 times a day Furosemide 20 mg IV every 12 hours Losartan 25 mg daily Spironolactone 25 mg daily  Labs:  Sodium 139, potassium 4.3, chloride 104, CO2 27, BUN 14, creatinine 0.96, albumin 2.7, AST 24, ALT 14, GFR 54.  Weight not documented Intake 216 mL Output 600 mL  Initial meeting with patient who was sitting in bed in no acute distress and her daughter [name] who was at the bedside.  Discussed heart failure and what it means.  Discussed her heart function at less than 15%.  Made aware that results of echocardiogram are still pending.  Went over the importance of sticking with fluid restriction of no more than 64 ounces.  Daughter states that she was not aware that mother should be restricting her fluid intake.  Also discussed the importance of daily weights.  They do not have a scale and are unable to weigh her.  Daughter goes on to states she is not sure that her mother could stand on a scale.  Made aware that if she had a flat bottom hard chair that they could set the scale on that and then her mother could sit on that thus getting a weight.  Also went over reporting 2 pound weight gain overnight or 5 pounds in a week.  Also talked about changes in symptoms to report.  Daughter states that she reads labels and they do not eat processed foods.  They try to stick with foods in their natural state fresh and frozen and lean meats.  Was given the living with heart failure teaching booklet, zone magnet, and follow-up on low-sodium and heart failure along with weight chart.  Will  continue to follow along. They were made aware of follow-up in the outpatient heart failure clinic on March 12 at 3 PM.  She had no further questions.  Pricilla Riffle RN [facility]

## 2022-09-02 NOTE — Progress Notes (Signed)
Physical Therapy Treatment Patient Details Name: Rebecca Wolf MRN: [id number] DOB: 09/06/1924 Today's Date: 09/02/2022   History of Present Illness Pt is a 89 y/o female admitted secondary to SOB and found to have a CHF exacerbation. IV lasix was initiated. PMH including but not limited to breast CA, HTN and seizures.    PT Comments    Pt is making gradual progress towards goals and remains limited in participation. Pt reports she feels better/stronger, however unable to reposition self in bed and limited use of B LE against gravity. Pt reports she is uncomfortable in bed, however declines attempts of OOB mobility or repositioning. Educated on benefit of mobility and gave HEP to perform. Will continue to progress as able.  Recommendations for follow up therapy are one component of a multi-disciplinary discharge planning process, led by the attending physician.  Recommendations may be updated based on patient status, additional functional criteria and insurance authorization.  Follow Up Recommendations  Skilled nursing-short term rehab (<3 hours/day) Can patient physically be transported by private vehicle: No   Assistance Recommended at Discharge Frequent or constant Supervision/Assistance  Patient can return home with the following A lot of help with walking and/or transfers;Two people to help with walking and/or transfers;A lot of help with bathing/dressing/bathroom;Assistance with cooking/housework;Assist for transportation;Help with stairs or ramp for entrance   Equipment Recommendations  Other (comment) (TBD)    Recommendations for Other Services       Precautions / Restrictions Precautions Precautions: Fall Restrictions Weight Bearing Restrictions: No     Mobility  Bed Mobility               General bed mobility comments: refuses all bed mobility attempts despite pt reporting she is uncomfortable. Noted pillows under extremities to assist with off loading pressure  spots    Transfers                   General transfer comment: declined all attempts for mobility    Ambulation/Gait                   Stairs             Wheelchair Mobility    Modified Rankin (Stroke Patients Only)       Balance                                            Cognition Arousal/Alertness: Awake/alert Behavior During Therapy: WFL for tasks assessed/performed Overall Cognitive Status: Impaired/Different from baseline                                 General Comments: poor insight into deficits. Aware of where she is and recite "homework" given to her from PT        Exercises Other Exercises Other Exercises: only agreeable to minimal supine ther-ex including AP, QS, knee flexion, SLRs, and hip abd/add. Pt needs min assist for initiation, however able to perform inconsistently with cga. ~5 reps prior to fatigue. Noted B LE edema    General Comments        Pertinent Vitals/Pain Pain Assessment Pain Assessment: No/denies pain    Home Living  Prior Function            PT Goals (current goals can now be found in the care plan section) Acute Rehab PT Goals Patient Stated Goal: pt wants to return home ASAP PT Goal Formulation: With patient/family Time For Goal Achievement: 09/15/22 Potential to Achieve Goals: Good Progress towards PT goals: Progressing toward goals    Frequency    Min 2X/week      PT Plan Current plan remains appropriate    Co-evaluation              AM-PAC PT "6 Clicks" Mobility   Outcome Measure  Help needed turning from your back to your side while in a flat bed without using bedrails?: A Little Help needed moving from lying on your back to sitting on the side of a flat bed without using bedrails?: A Lot Help needed moving to and from a bed to a chair (including a wheelchair)?: A Lot Help needed standing up from a chair using  your arms (e.g., wheelchair or bedside chair)?: A Lot Help needed to walk in hospital room?: A Lot Help needed climbing 3-5 steps with a railing? : Total 6 Click Score: 12    End of Session   Activity Tolerance: Patient tolerated treatment well Patient left: in bed;with call bell/phone within reach;with bed alarm set;with family/visitor present Nurse Communication: Mobility status PT Visit Diagnosis: Other abnormalities of gait and mobility (R26.89)     Time: [id number] PT Time Calculation (min) (ACUTE ONLY): 9 min  Charges:  $Therapeutic Exercise: 8-22 mins                     Greggory Stallion, PT, DPT, GCS [phone]    Rebecca Wolf 09/02/2022, 2:21 PM

## 2022-09-02 NOTE — Progress Notes (Signed)
Progress Note   Patient: Rebecca Wolf [id number] DOB: 1925-03-18 DOA: 08/30/2022     0 DOS: the patient was seen and examined on 09/02/2022    Subjective: Patient seen and examined at bedside this morning in the presence of patient's daughter Patient is doing a whole lot better today Electrolytes have been corrected Case managers have been informed concerning placement   Brief hospital course: 89 years old female with medical history of hypertension, seizure disorder, CHF with EF of 20% and GERD Presented to the ED with complaint of shortness of breath.  Patient reports shortness of breath lasted about 30 minutes and then resolved.  Patient was asymptomatic on presentation in the ED.  As per daughter patient momentarily gets short of breath when she has to use the restroom in the heat of the event patient gets anxious and short of breath.  No complaint of associated chest pain, diaphoresis, headache, blurring of vision, palpitation, abdominal pain, dysuria and diarrhea.  No sick contacts or recent travel.   3/2 : Patient was bradycardic on amiodarone 200 mg daily.  Patient with no documentation of arrhythmia.  Was tachycardic on presentation which improved in the ED close now with bradycardia.   3/3: patient had an episode of seizure.  This was witnessed by patient's daughter in the room.  According to her neurologist was going inpatient third dose of Keppra.  We will give loading dose of Keppra in the chest patient's maintenance dose at discharge.   assessment and Plan: * SOB (shortness of breath) secondary to acute on Chronic systolic CHF (congestive heart failure)    Most recent 2DEcho last Thursday shows EF of 15% at [place] C/W reduced ejection fraction heart failure.  Patient currently has symptoms of acute on chronic heart failure exacerbation as evidenced by orthopnea, dyspnea and crackles on examination Continue spironolactone, losartan Holding beta-blockers at this time on  account of bradycardia Continue current diuresis Resume meds as tolerated.  Strict I/O. Daily weight     Acute on chronic seizure disorder  At home patient takes Keppra 375 mg bid. Outpatient neurologist have been trying to open when head of Keppra so dose was subsequently decreased by her outpatient neurologist Patient had witnessed seizure on 09/01/2022 We will keep patient on IV Keppra 750 mg twice daily and hopefully at discharge continue on same dose Seizure precaution.  Neurologist on board we appreciate input   Hypomagnesemia Hypokalemia Now resolved will continue to monitor levels   Possible arrhythmia Patient had nonsustained V. tach on 09/01/2022 Dose of amiodarone have been decreased on account of bradycardia Patient noted to have an episode of torsades on telemetry on 09/01/2022 EKG obtained showed sinus rhythm Audiologist on board   Normocytic anemia No indication for transfusion at this time Continue to monitor CBC   Diabetes mellitus with no complications Continue to monitor glucose level Continue current insulin regiment   Pulmonary embolism  Daughter said pt has h/o VTE and is on eliquis.  CTA neg for PE Will cont eliquis.    Physical Exam HENT:     Head: Normocephalic.  Eyes:     Pupils: Pupils are equal, round, and reactive to light.  Cardiovascular:     Rate and Rhythm: Tachycardia resolved    Pulses: Normal pulses.  Pulmonary:     Crackles on pulmonary exam bilaterally especially at the bases   Abdominal:     General: Bowel sounds are normal.     Palpations: Abdomen is soft.  Musculoskeletal:  General: Normal range of motion.  Skin:    General: Skin is warm.  Neurological:     General: No focal deficit present.     Mental Status: She is alert but appears anxious     Data Reviewed: I have reviewed the patient's CBC, BMP as well as magnesium levels    Family Communication: Plan of care discussed with patient, nursing staff as well  as patient's daughter present at bedside   Disposition: Status is: Inpatient    Planned Discharge Destination: Rehab/skilled nursing facility when medically stable     Time spent: 55 minutes      Vitals:   09/02/22 0732 09/02/22 0758 09/02/22 1140 09/02/22 1540  BP:  101/64 98/64 (!) 88/38  Pulse:  (!) 47 97 94  Resp:  '20 14 16  '$ Temp:  98.3 F (36.8 C) 98.4 F (36.9 C) 98.7 F (37.1 C)  TempSrc:   Oral Oral  SpO2: 95% 90% 94% 97%  Weight:      Height:        Author: Verline Lema, MD 09/02/2022 6:50 PM  For on call review [email or web address].

## 2022-09-02 NOTE — Progress Notes (Signed)
Nutrition Follow-up  DOCUMENTATION CODES:   Non-severe (moderate) malnutrition in context of chronic illness  INTERVENTION:   -500 mg vitamin C BID -Continue 220 mg zinc sulfate daily x 14 days -Continue Glucerna Shake po TID, each supplement provides 220 kcal and 10 grams of protein  -Liberalize diet to regular for widest variety of meal selections  NUTRITION DIAGNOSIS:   Moderate Malnutrition related to chronic illness (CHF) as evidenced by mild fat depletion, moderate fat depletion, mild muscle depletion, moderate muscle depletion, edema.  Ongoing  GOAL:   Patient will meet greater than or equal to 90% of their needs  Progressing   MONITOR:   PO intake, Supplement acceptance  REASON FOR ASSESSMENT:   Consult Assessment of nutrition requirement/status  ASSESSMENT:   89 y.o. female admits related to SOB. PMH includes: asthma, breast cancer, GERD, hemorrhoids, HTN.  Reviewed I/O's: -384 ml x 24 hours and -2 L since admission  UOP: 600 ml x 24 hours   Spoke with pt and daughter at bedside. Daughter provided most of the history. Pt reports feeling better and was pleased to have "cake and ice cream" for her birthday on Saturday, 08/31/22. Pt daughter reports that pt's intake has improved today, due to improved respiratory status and diuresis. Pt consumed about 75% of her breakfast, including cereal, eggs, and [place] sausage. Pt typically consumes 2-3 meals per day, but intake is often erratic based upon how pt is feeling.   Reviewed wt hx; pt has experienced a 8.2% wt loss over the past year. Per pt daughter, pt's weigh fluctuates at baseline secondary to fluid changes from CHF. Pt does not weigh herself at home, as she is unsteady on her feet when standing and balance makes it difficult for her weigh safely. Daughter is hopeful that increased therapy services will allow her to stand to weigh more consistently. Per therapy notes, pt would benefit from SNF, but desires home  health services; daughter confirms this.   Pt likes Glucerna supplements, but daughter voiced concern with her being able to drink these on fluid restriction. Reached out to MD and received permission to liberalize fluid restriction. Discussed importance of good meal and supplement intake to promote healing. Daughter would like to continue supplements at home- would prefer to continue Glucerna supplements secondary to DM.   Medications reviewed and include vitamin C, lasix, aldactone, keppra, and zinc sulfate.   Labs reviewed: CBGS: 107-111 (inpatient orders for glycemic control are 0-5 units insulin aspart daily at bedtime and 0-9 units insulin aspart TID with meals).    NUTRITION - FOCUSED PHYSICAL EXAM:  Flowsheet Row Most Recent Value  Orbital Region Mild depletion  Upper Arm Region Mild depletion  Thoracic and Lumbar Region Mild depletion  Buccal Region Moderate depletion  Temple Region Moderate depletion  Clavicle Bone Region Severe depletion  Clavicle and Acromion Bone Region Severe depletion  Scapular Bone Region Severe depletion  Dorsal Hand Moderate depletion  Patellar Region Mild depletion  Anterior Thigh Region Mild depletion  Posterior Calf Region Mild depletion  Edema (RD Assessment) Mild  Hair Reviewed  Eyes Reviewed  Mouth Reviewed  Skin Reviewed  Nails Reviewed       Diet Order:   Diet Order             Diet regular Room service appropriate? Yes; Fluid consistency: Thin  Diet effective now                   EDUCATION NEEDS:   Education needs  have been addressed  Skin:  Skin Assessment: Skin Integrity Issues: Skin Integrity Issues:: Stage II Stage II: sacrum  Last BM:  09/02/22 (type 6)  Height:   Ht Readings from Last 1 Encounters:  08/30/22 '5\' 4"'$  (1.626 m)    Weight:   Wt Readings from Last 1 Encounters:  09/01/22 71.6 kg    Ideal Body Weight:  54.5 kg  BMI:  Body mass index is 27.09 kg/m.  Estimated Nutritional Needs:   Kcal:   1700-1900  Protein:  90-105 grams  Fluid:  > 1.7 L    Loistine Chance, RD, LDN, [place] Registered Dietitian II Certified Diabetes Care and Education Specialist Please refer to [facility] for RD and/or RD on-call/weekend/after hours pager

## 2022-09-02 NOTE — Plan of Care (Signed)

## 2022-09-02 NOTE — Progress Notes (Signed)
[facility] Cardiology    SUBJECTIVE: Patient resting comfortably with shortness of breath dyspnea since admission recently seen at [facility] for similar presentation EF was less than 20% at that time she had significant leg edema which is somewhat improved but denies any significant chest pain is had recurrent acute dyspnea probably related to heart failure probably related to anxiety but resting comfortably currently   Vitals:   09/01/22 2321 09/02/22 0732 09/02/22 0758 09/02/22 1140  BP: 108/65  101/64 98/64  Pulse: 100  (!) 47 97  Resp: '20  20 14  '$ Temp: (!) 97 F (36.1 C)  98.3 F (36.8 C) 98.4 F (36.9 C)  TempSrc:    Oral  SpO2: 97% 95% 90% 94%  Weight:      Height:         Intake/Output Summary (Last 24 hours) at 09/02/2022 1146 Last data filed at 09/02/2022 1023 Gross per 24 hour  Intake 936 ml  Output 600 ml  Net 336 ml      PHYSICAL EXAM  General: Well developed, well nourished, in no acute distress HEENT:  Normocephalic and atramatic Neck:  No JVD.  Lungs: Clear bilaterally to auscultation and percussion. Heart: HRRR . Normal S1 and S2 without gallops or murmurs.  Abdomen: Bowel sounds are positive, abdomen soft and non-tender  Msk:  Back normal, normal gait. Normal strength and tone for age. Extremities: No clubbing, cyanosis or 3+edema.   Neuro: Alert and oriented X 3. Psych:  Good affect, responds appropriately   LABS: Basic Metabolic Panel: Recent Labs    09/01/22 0434 09/01/22 1856 09/02/22 0606  NA 139  --  139  K 3.4*  --  4.3  CL 105  --  104  CO2 25  --  27  GLUCOSE 106*  --  108*  BUN 15  --  14  CREATININE 0.98  --  0.96  CALCIUM 8.1*  --  8.4*  MG 1.3* 3.2* 2.4   Liver Function Tests: Recent Labs    08/31/22 0437 09/02/22 0606  AST 24 24  ALT 14 14  ALKPHOS 111 109  BILITOT 0.5 0.3  PROT 5.9* 6.2*  ALBUMIN 2.4* 2.7*   No results for input(s): "LIPASE", "AMYLASE" in the last 72 hours. CBC: Recent Labs    08/30/22 1501 08/31/22 0437   WBC 5.3 6.0  HGB 10.8* 9.7*  HCT 35.7* 31.5*  MCV 76.9* 75.0*  PLT 248 227   Cardiac Enzymes: No results for input(s): "CKTOTAL", "CKMB", "CKMBINDEX", "TROPONINI" in the last 72 hours. BNP: Invalid input(s): "POCBNP" D-Dimer: No results for input(s): "DDIMER" in the last 72 hours. Hemoglobin A1C: Recent Labs    08/30/22 1501  HGBA1C 7.6*   Fasting Lipid Panel: No results for input(s): "CHOL", "HDL", "LDLCALC", "TRIG", "CHOLHDL", "LDLDIRECT" in the last 72 hours. Thyroid Function Tests: No results for input(s): "TSH", "T4TOTAL", "T3FREE", "THYROIDAB" in the last 72 hours.  Invalid input(s): "FREET3" Anemia Panel: No results for input(s): "VITAMINB12", "FOLATE", "FERRITIN", "TIBC", "IRON", "RETICCTPCT" in the last 72 hours.  No results found.   Echo severely depressed left ventricular function dilated cardiomyopathy EF less than 20%  TELEMETRY: Normal sinus rhythm rate around 65:  ASSESSMENT AND PLAN:  Principal Problem:   SOB (shortness of breath) Active Problems:   Pulmonary embolism (HCC)   Seizure disorder (HCC)   CKD stage G3b/A1, GFR 30-44 and albumin creatinine ratio <30 mg/g (HCC)   Diabetes mellitus with no complication (HCC)   Anemia   Chronic systolic CHF (  congestive heart failure) (HCC)   Electrolyte abnormality   Acute on chronic HFrEF (heart failure with reduced ejection fraction) (HCC)    Plan Acute on chronic systolic congestive heart failure severely depressed LV function EF less than 20% continue heart failure therapy and management Chronic renal insufficiency continue with the patient followed by nephrology continue current management Shortness of breath somewhat improved related to heart failure continue inhalers and diuretics as necessary Seizure disorder resume Keppra for recent seizure activity follow-up with neurology Extremity edema somewhat improved with elevation consider wrapping consider diuretics Diabetes type 2 uncomplicated  continue diabetes therapy as necessary Increase activity out of bed to chair as well as physical therapy Continue conservative cardiac input at discharge patient somewhat improved   Yolonda Kida, MD 09/02/2022 11:46 AM

## 2022-09-02 NOTE — TOC Progression Note (Addendum)
Transition of Care [facility]) - Progression Note    Patient Details  Name: TALEISHA KRAUSKOPF MRN: [id number] Date of Birth: 06-Jun-1925  Transition of Care [facility]) CM/SW Contact  Laurena Slimmer, RN Phone Number: 09/02/2022, 11:05 AM  Clinical Narrative:    Spoke with patient and her daughter [name] regarding therapy recommendation and discharge plans. [name] advised of recommendation and provided with bed offers for [facility] and [facility]. Patient daughter stated patient would be returning home. She would like [place] arranged and does not have a preference. She was advised a referral would be sent to a hospital preferred provider. EMS requested at discharge.   Referral sent to [facility] [place].    Expected Discharge Plan: [place] Barriers to Discharge: Continued Medical Work up  Expected Discharge Plan and Services       Living arrangements for the past 2 months: Single Family Home                                       Social Determinants of Health (SDOH) Interventions SDOH Screenings   Food Insecurity: No Food Insecurity (08/30/2022)  Housing: Low Risk  (08/30/2022)  Transportation Needs: No Transportation Needs (08/30/2022)  Utilities: Not At Risk (08/30/2022)  Tobacco Use: Medium Risk (09/02/2022)    Readmission Risk Interventions     No data to display

## 2022-09-02 NOTE — Progress Notes (Signed)
*  PRELIMINARY RESULTS* Echocardiogram 2D Echocardiogram has been performed.  Rebecca Wolf 09/02/2022, 8:37 AM

## 2022-09-03 ENCOUNTER — Inpatient Hospital Stay: Payer: 59

## 2022-09-03 DIAGNOSIS — E440 Moderate protein-calorie malnutrition: Secondary | ICD-10-CM | POA: Insufficient documentation

## 2022-09-03 DIAGNOSIS — R0602 Shortness of breath: Secondary | ICD-10-CM | POA: Diagnosis not present

## 2022-09-03 LAB — COMPREHENSIVE METABOLIC PANEL
ALT: 13 U/L (ref 0–44)
AST: 21 U/L (ref 15–41)
Albumin: 2.5 g/dL — ABNORMAL LOW (ref 3.5–5.0)
Alkaline Phosphatase: 103 U/L (ref 38–126)
Anion gap: 6 (ref 5–15)
BUN: 18 mg/dL (ref 8–23)
CO2: 26 mmol/L (ref 22–32)
Calcium: 8.3 mg/dL — ABNORMAL LOW (ref 8.9–10.3)
Chloride: 103 mmol/L (ref 98–111)
Creatinine, Ser: 1.09 mg/dL — ABNORMAL HIGH (ref 0.44–1.00)
GFR, Estimated: 46 mL/min — ABNORMAL LOW (ref >60)
Glucose, Bld: 107 mg/dL — ABNORMAL HIGH (ref 70–99)
Potassium: 4.1 mmol/L (ref 3.5–5.1)
Sodium: 135 mmol/L (ref 135–145)
Total Bilirubin: 0.7 mg/dL (ref 0.3–1.2)
Total Protein: 5.7 g/dL — ABNORMAL LOW (ref 6.5–8.1)

## 2022-09-03 LAB — MAGNESIUM: Magnesium: 2 mg/dL (ref 1.7–2.4)

## 2022-09-03 LAB — GLUCOSE, CAPILLARY
Glucose-Capillary: 97 mg/dL (ref 70–99)
Glucose-Capillary: 150 mg/dL — ABNORMAL HIGH (ref 70–99)
Glucose-Capillary: 161 mg/dL — ABNORMAL HIGH (ref 70–99)
Glucose-Capillary: 94 mg/dL (ref 70–99)

## 2022-09-03 LAB — CBC
HCT: 32.5 % — ABNORMAL LOW (ref 36.0–46.0)
Hemoglobin: 10.1 g/dL — ABNORMAL LOW (ref 12.0–15.0)
MCH: 23.1 pg — ABNORMAL LOW (ref 26.0–34.0)
MCHC: 31.1 g/dL (ref 30.0–36.0)
MCV: 74.2 fL — ABNORMAL LOW (ref 80.0–100.0)
Platelets: 266 10*3/uL (ref 150–400)
RBC: 4.38 MIL/uL (ref 3.87–5.11)
RDW: 17.4 % — ABNORMAL HIGH (ref 11.5–15.5)
WBC: 6.4 10*3/uL (ref 4.0–10.5)
nRBC: 0 % (ref 0.0–0.2)

## 2022-09-03 LAB — LEVETIRACETAM LEVEL: Levetiracetam Lvl: 41.1 ug/mL — ABNORMAL HIGH (ref 10.0–40.0)

## 2022-09-03 MED ORDER — AZITHROMYCIN 500 MG PO TABS
500.0000 mg | ORAL_TABLET | Freq: Every day | ORAL | Status: AC
  Administered 2022-09-03 – 2022-09-05 (×3): 500 mg via ORAL
  Filled 2022-09-03: qty 1
  Filled 2022-09-03: qty 2
  Filled 2022-09-03: qty 1

## 2022-09-03 MED ORDER — FUROSEMIDE 20 MG PO TABS
20.0000 mg | ORAL_TABLET | Freq: Every day | ORAL | Status: DC
  Administered 2022-09-04 – 2022-09-06 (×3): 20 mg via ORAL
  Filled 2022-09-03 (×3): qty 1

## 2022-09-03 MED ORDER — LEVETIRACETAM 750 MG PO TABS
750.0000 mg | ORAL_TABLET | Freq: Two times a day (BID) | ORAL | Status: DC
  Administered 2022-09-03 – 2022-09-04 (×2): 750 mg via ORAL
  Filled 2022-09-03 (×2): qty 1

## 2022-09-03 MED ORDER — SODIUM CHLORIDE 0.9 % IV SOLN
1.0000 g | INTRAVENOUS | Status: DC
  Administered 2022-09-03 – 2022-09-05 (×3): 1 g via INTRAVENOUS
  Filled 2022-09-03 (×4): qty 10

## 2022-09-03 NOTE — TOC Progression Note (Addendum)
Transition of Care [facility]) - Progression Note    Patient Details  Name: Rebecca Wolf MRN: [id number] Date of Birth: [birth year]  [facility] CM/SW Contact  Laurena Slimmer, RN Phone Number: 09/03/2022, 2:35 PM  Clinical Narrative:    Spoke with patient's daughter regarding discharge plan. She stated after speaking with her siblings they have decided it's best for patient to go to SNF. She was not agreeable to the current bed offers for [facility].  She was advised once the discharge order is placed the bed offers that are present are the once they will have to chose from. [name] advised [facility] is still required.  Referral sent to [place]   3:30pm Spoke with patient and her daughter in the room. Patient daughter confirm SNF is preferred. She was advised [facility] Kaufmann is depending on the notes submitted. She was advised therapy had worked with the patient while she was gone. She was advised patient has to have a skilled need for auth approval.      Expected Discharge Plan: [place] Barriers to Discharge: Continued Medical Work up  Expected Discharge Plan and Services       Living arrangements for the past 2 months: Single Family Home                                       Social Determinants of Health (SDOH) Interventions SDOH Screenings   Food Insecurity: No Food Insecurity (08/30/2022)  Housing: Low Risk  (08/30/2022)  Transportation Needs: No Transportation Needs (08/30/2022)  Utilities: Not At Risk (08/30/2022)  Tobacco Use: Medium Risk (09/02/2022)    Readmission Risk Interventions     No data to display

## 2022-09-03 NOTE — Progress Notes (Signed)
Physical Therapy Treatment Patient Details Name: Rebecca Wolf MRN: [id number] DOB: December 18, 1924 Today's Date: 09/03/2022   History of Present Illness Pt is a 89 y/o female admitted secondary to SOB and found to have a CHF exacerbation. IV lasix was initiated. PMH including but not limited to breast CA, HTN and seizures.    PT Comments    Pt is not making progress towards goals. Discussed with patient for need for continued mobility trials throughout hospital stay. Pt refuses all attempts of mobility. Two untouched food trays present. Pt currently refusing to eat and says she will do everything once daughter returns. Pt soiled in bed, assisted in hygiene and purewick placement. Pt demonstrates supine level HEP for B LE/UE. Reports she is not going to SNF and in fact planning to dc home this date. Reached out via secure chat to [facility] for further management of case. Will continue to progress as able.   Recommendations for follow up therapy are one component of a multi-disciplinary discharge planning process, led by the attending physician.  Recommendations may be updated based on patient status, additional functional criteria and insurance authorization.  Follow Up Recommendations  Skilled nursing-short term rehab (<3 hours/day) Can patient physically be transported by private vehicle: No   Assistance Recommended at Discharge Frequent or constant Supervision/Assistance  Patient can return home with the following A lot of help with walking and/or transfers;Two people to help with walking and/or transfers;A lot of help with bathing/dressing/bathroom;Assistance with cooking/housework;Assist for transportation;Help with stairs or ramp for entrance   Equipment Recommendations   (TBD)    Recommendations for Other Services       Precautions / Restrictions Precautions Precautions: Fall Precaution Comments: currently refusing all mobility attempts Restrictions Weight Bearing Restrictions: No      Mobility  Bed Mobility Overal bed mobility: Needs Assistance             General bed mobility comments: refused all mobility attempts for OOB. Pt does sit up while in bed for therapist to check for soiled linen    Transfers                   General transfer comment: refused despite heavy encouragement    Ambulation/Gait                   Stairs             Wheelchair Mobility    Modified Rankin (Stroke Patients Only)       Balance                                            Cognition Arousal/Alertness: Awake/alert Behavior During Therapy: WFL for tasks assessed/performed, Flat affect Overall Cognitive Status: Impaired/Different from baseline                                 General Comments: alert and has poor insight into deficits. Needs max assist for encouragement        Exercises Other Exercises Other Exercises: Pt able to complete bed level. ther-ex while in bed including heel slides, SLRs, quad sets, and hip add squeezes. x 10 reps. Pt also demonstrates B UE given from OT.    General Comments        Pertinent Vitals/Pain Pain Assessment Pain Assessment: No/denies  pain    Home Living                          Prior Function            PT Goals (current goals can now be found in the care plan section) Acute Rehab PT Goals Patient Stated Goal: pt wants to return home ASAP PT Goal Formulation: With patient Time For Goal Achievement: 09/15/22 Potential to Achieve Goals: Fair Progress towards PT goals: Not progressing toward goals - comment    Frequency    Min 2X/week      PT Plan Current plan remains appropriate    Co-evaluation              AM-PAC PT "6 Clicks" Mobility   Outcome Measure  Help needed turning from your back to your side while in a flat bed without using bedrails?: A Lot Help needed moving from lying on your back to sitting on the side of a  flat bed without using bedrails?: A Lot Help needed moving to and from a bed to a chair (including a wheelchair)?: A Lot Help needed standing up from a chair using your arms (e.g., wheelchair or bedside chair)?: A Lot Help needed to walk in hospital room?: A Lot Help needed climbing 3-5 steps with a railing? : Total 6 Click Score: 11    End of Session   Activity Tolerance:  (limited by pt refusal) Patient left: in bed;with bed alarm set Nurse Communication: Mobility status PT Visit Diagnosis: Other abnormalities of gait and mobility (R26.89)     Time: [id number] PT Time Calculation (min) (ACUTE ONLY): 11 min  Charges:  $Therapeutic Exercise: 8-22 mins                     Greggory Stallion, PT, DPT, GCS [phone]    Keila Turan 09/03/2022, 2:53 PM

## 2022-09-03 NOTE — Progress Notes (Addendum)
Progress Note   Patient: Rebecca Wolf [id number] DOB: 06-27-1925 DOA: 08/30/2022     1 DOS: the patient was seen and examined on 09/03/2022    Subjective: Patient seen and examined at bedside this morning in the presence of patient's daughter Patient denies worsening shortness of breath Has been cleared by cardiologist for discharge pending skilled nursing facility/rehab authorization Denies chest pain nausea vomiting abdominal pain  She did complain of cough productive of whitish sputum for which chest x-ray was obtained today showing new patchy infiltrate on the right side.  Antibiotics have been added Case managers have been informed concerning placement   Brief hospital course: 89 years old female with medical history of hypertension, seizure disorder, CHF with EF of 20% and GERD Presented to the ED with complaint of shortness of breath.  Patient reports shortness of breath lasted about 30 minutes and then resolved.  Patient was asymptomatic on presentation in the ED.  As per daughter patient momentarily gets short of breath when she has to use the restroom in the heat of the event patient gets anxious and short of breath.  No complaint of associated chest pain, diaphoresis, headache, blurring of vision, palpitation, abdominal pain, dysuria and diarrhea.  No sick contacts or recent travel.   3/2 : Patient was bradycardic on amiodarone 200 mg daily.  Patient with no documentation of arrhythmia.  Was tachycardic on presentation which improved in the ED close now with bradycardia.   3/3: patient had an episode of seizure.  This was witnessed by patient's daughter in the room.  According to patient's daughter, neurologist was weaning patient off  Keppra.  Patient seen by neurologist who recommended going back on patient's previous Keppra dose of 750 twice daily.    assessment and Plan: * SOB (shortness of breath) secondary to acute on Chronic systolic CHF (congestive heart failure)    Most  recent 2DEcho last Thursday shows EF of 15% at [place] C/W reduced ejection fraction heart failure.  Patient currently has symptoms of acute on chronic heart failure exacerbation as evidenced by orthopnea, dyspnea and crackles on examination Continue spironolactone, losartan Holding beta-blockers at this time on account of bradycardia We will consider adding SGLT2i if creatinine allows, holding off as there is slight bump in creatinine today IV Lasix has been switched to p.o. Lasix today Strict I/O. Daily weight    Community-acquired pneumonia This was likely present on admission Chest x-ray obtained showed new infiltrates on the right side Continue ceftriaxone and azithromycin  Acute on chronic seizure disorder  At home patient takes Keppra 375 mg bid. Outpatient neurologist have been trying to open when head of Keppra so dose was subsequently decreased by her outpatient neurologist Patient had witnessed seizure on 09/01/2022 IV Keppra has been switched to oral Keppra 750 twice daily today. Patient will be discharged on 750 mg of Keppra twice daily Seizure precaution.  Neurologist on board we appreciate input   Hypomagnesemia Hypokalemia Now resolved will continue to monitor levels   Possible arrhythmia Patient had nonsustained V. 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  CTA  neg for PE Continue Eliquis     Physical Exam HENT:     Head: Normocephalic.  Eyes:     Pupils: Pupils are equal, round, and reactive to light.  Cardiovascular:     Rate and  Rhythm: Tachycardia resolved    Pulses: Normal pulses.  Pulmonary:     Crackles on pulmonary exam bilaterally especially at the bases   Abdominal:     General: Bowel sounds are normal.     Palpations: Abdomen is soft.  Musculoskeletal:        General: Normal range of motion.  Skin:    General: Skin is warm.  Neurological:     General: No focal deficit present.     Mental Status: She is alert but appears anxious     Data Reviewed: I have reviewed the patient's CBC, BMP as well as magnesium levels     Family Communication: Plan of care discussed with patient, nursing staff as well as patient's daughter present at bedside   Disposition: Status is: Inpatient     Planned Discharge Destination: Rehab/skilled nursing facility when medically stable     Physical Exam: Vitals:   09/03/22 0415 09/03/22 0500 09/03/22 0821 09/03/22 1123  BP: 102/63  122/65 (!) 106/56  Pulse: 95  83 (!) 47  Resp: '18  18 14  '$ Temp: 98.6 F (37 C)   98.5 F (36.9 C)  TempSrc:      SpO2: 93%  97% 96%  Weight:  71.8 kg    Height:        Author: Verline Lema, MD 09/03/2022 4:53 PM  For on call review [email or web address].

## 2022-09-03 NOTE — Progress Notes (Signed)
Scheduled lasix not given. Pt BP 97/52, RR in low 20s, SpO2 92-97% on room air. Lung sounds clear in upper and diminished in lower lobes. No signs of distress nor dyspnea, pt resting comfortably in bed. Family also refusing Lasix tonight. [place], NP notified and agreeable to hold Lasix tonight.

## 2022-09-03 NOTE — Progress Notes (Signed)
Unable to measure urine output, pt incontinent, but refusing external urinary catheter.

## 2022-09-03 NOTE — Progress Notes (Signed)
Occupational Therapy Treatment Patient Details Name: Rebecca Wolf MRN: [id number] DOB: 01/16/1925 Today's Date: 09/03/2022   History of present illness Pt is a 89 y/o female admitted secondary to SOB and found to have a CHF exacerbation. IV lasix was initiated. PMH including but not limited to breast CA, HTN and seizures.   OT comments  Patient received supine in bed with RN/NT present who reported just giving pt a bed bath. Daughter present. Pt deferred all EOB/OOB mobility this date. Agreeable to bed level BUE/LE AROM exercises (see details below) with encouragement. Tolerated well. Assistance provided for repositioning pt in bed and BLEs elevated on pillows. Pt left as received with all needs in reach. D/C recommendation remains appropriate. OT will continue to follow acutely.    Recommendations for follow up therapy are one component of a multi-disciplinary discharge planning process, led by the attending physician.  Recommendations may be updated based on patient status, additional functional criteria and insurance authorization.    Follow Up Recommendations  Skilled nursing-short term rehab (<3 hours/day)     Assistance Recommended at Discharge Frequent or constant Supervision/Assistance  Patient can return home with the following  A lot of help with bathing/dressing/bathroom;Assistance with cooking/housework;Assist for transportation;Help with stairs or ramp for entrance;Two people to help with walking and/or transfers   Equipment Recommendations  Other (comment) (defer to next venue of care)    Recommendations for Other Services      Precautions / Restrictions Precautions Precautions: Fall Precaution Comments: gets incredibly anxious with mobility Restrictions Weight Bearing Restrictions: No       Mobility Bed Mobility Overal bed mobility: Needs Assistance             General bed mobility comments: Pt deferred all EOB/OOB mobility this date. Assistance provided for  repositioning in bed and pillows provided to elevate BLEs.    Transfers Overall transfer level: Needs assistance                 General transfer comment: Pt deferred     Balance           ADL either performed or assessed with clinical judgement   ADL Overall ADL's : Needs assistance/impaired                                       General ADL Comments: Pt deferred, per RN/daughter pt just received bed bath.    Extremity/Trunk Assessment Upper Extremity Assessment Upper Extremity Assessment: Generalized weakness   Lower Extremity Assessment Lower Extremity Assessment: Generalized weakness   Cervical / Trunk Assessment Cervical / Trunk Assessment: Kyphotic    Vision Patient Visual Report: No change from baseline     Perception     Praxis      Cognition Arousal/Alertness: Awake/alert Behavior During Therapy: WFL for tasks assessed/performed, Flat affect Overall Cognitive Status: Impaired/Different from baseline           General Comments: Pt A&O to self and location. Poor insight into deficits. Encouragement required to participate.        Exercises General Exercises - Upper Extremity Shoulder Flexion: AROM, Both, 10 reps, Supine Shoulder Horizontal ABduction: AROM, Both, 10 reps, Supine Shoulder Horizontal ADduction: AROM, Both, 10 reps, Supine Elbow Flexion: AROM, Both, 10 reps, Supine Elbow Extension: AROM, Both, 10 reps, Supine General Exercises - Lower Extremity Ankle Circles/Pumps: AROM, Both, 10 reps, Supine Hip ABduction/ADduction: AROM, Both, 10 reps, Supine  Hip Flexion/Marching: AROM, Both, 10 reps, Supine Other Exercises Other Exercises: Encouraged pt to complete BUE AROM exercises x10 each, 2-3x/day while in bed to maintain ROM/strength.    Shoulder Instructions       General Comments      Pertinent Vitals/ Pain       Pain Assessment Pain Assessment: No/denies pain  Home Living        Prior  Functioning/Environment              Frequency  Min 2X/week        Progress Toward Goals  OT Goals(current goals can now be found in the care plan section)  Progress towards OT goals: Progressing toward goals  Acute Rehab OT Goals Patient Stated Goal: return home OT Goal Formulation: With patient/family Time For Goal Achievement: 09/15/22 Potential to Achieve Goals: [place] Discharge plan remains appropriate;Frequency remains appropriate    Co-evaluation                 AM-PAC OT "6 Clicks" Daily Activity     Outcome Measure   Help from another person eating meals?: A Little Help from another person taking care of personal grooming?: A Little Help from another person toileting, which includes using toliet, bedpan, or urinal?: A Lot Help from another person bathing (including washing, rinsing, drying)?: A Lot Help from another person to put on and taking off regular upper body clothing?: A Little Help from another person to put on and taking off regular lower body clothing?: A Lot 6 Click Score: 15    End of Session    OT Visit Diagnosis: Other abnormalities of gait and mobility (R26.89);Muscle weakness (generalized) (M62.81)   Activity Tolerance Patient tolerated treatment well    Patient Left in bed;with call bell/phone within reach;with bed alarm set;with family/visitor present   Nurse Communication Mobility status        Time: [id number] OT Time Calculation (min): 12 min  Charges: OT General Charges $OT Visit: 1 Visit OT Treatments $Therapeutic Exercise: 8-22 mins  [facility] MS, OTR/L ascom [phone]  09/03/22, 11:08 AM

## 2022-09-03 NOTE — Plan of Care (Signed)
  Problem: Cardiac: Goal: Ability to achieve and maintain adequate cardiopulmonary perfusion will improve Outcome: Progressing   Problem: Metabolic: Goal: Ability to maintain appropriate glucose levels will improve Outcome: Progressing   Problem: Nutritional: Goal: Maintenance of adequate nutrition will improve Outcome: Progressing Goal: Progress toward achieving an optimal weight will improve Outcome: Progressing   Problem: Education: Goal: Ability to demonstrate management of disease process will improve Outcome: Not Progressing Goal: Ability to verbalize understanding of medication therapies will improve Outcome: Not Progressing Goal: Individualized Educational Video(s) Outcome: Not Progressing   Problem: Activity: Goal: Capacity to carry out activities will improve Outcome: Not Progressing   Problem: Education: Goal: Ability to describe self-care measures that may prevent or decrease complications (Diabetes Survival Skills Education) will improve Outcome: Not Progressing Goal: Individualized Educational Video(s) Outcome: Not Progressing   Problem: Coping: Goal: Ability to adjust to condition or change in health will improve Outcome: Not Progressing   Problem: Fluid Volume: Goal: Ability to maintain a balanced intake and output will improve Outcome: Not Progressing   Problem: Health Behavior/Discharge Planning: Goal: Ability to identify and utilize available resources and services will improve Outcome: Not Progressing Goal: Ability to manage health-related needs will improve Outcome: Not Progressing   Problem: Skin Integrity: Goal: Risk for impaired skin integrity will decrease Outcome: Not Progressing   Problem: Tissue Perfusion: Goal: Adequacy of tissue perfusion will improve Outcome: Not Progressing   Problem: Education: Goal: Knowledge of General Education information will improve Description: Including pain rating scale, medication(s)/side effects and  non-pharmacologic comfort measures Outcome: Not Progressing   Problem: Health Behavior/Discharge Planning: Goal: Ability to manage health-related needs will improve Outcome: Not Progressing   Problem: Clinical Measurements: Goal: Ability to maintain clinical measurements within normal limits will improve Outcome: Not Progressing Goal: Will remain free from infection Outcome: Not Progressing Goal: Diagnostic test results will improve Outcome: Not Progressing Goal: Respiratory complications will improve Outcome: Not Progressing Goal: Cardiovascular complication will be avoided Outcome: Not Progressing   Problem: Activity: Goal: Risk for activity intolerance will decrease Outcome: Not Progressing   Problem: Nutrition: Goal: Adequate nutrition will be maintained Outcome: Not Progressing   Problem: Coping: Goal: Level of anxiety will decrease Outcome: Not Progressing   Problem: Elimination: Goal: Will not experience complications related to bowel motility Outcome: Not Progressing Goal: Will not experience complications related to urinary retention Outcome: Not Progressing   Problem: Pain Managment: Goal: General experience of comfort will improve Outcome: Not Progressing   Problem: Safety: Goal: Ability to remain free from injury will improve Outcome: Not Progressing   Problem: Skin Integrity: Goal: Risk for impaired skin integrity will decrease Outcome: Not Progressing

## 2022-09-03 NOTE — Consult Note (Signed)
[name]       Patient ID: Rebecca Wolf MRN: [id number] DOB/AGE: 87-08-1924 87 y.o.  Admit date: 08/30/2022 Referring Physician Dr. Marguerita Merles Primary Physician Dr. Ginette Pitman Primary Cardiologist Dr. Clayborn Bigness Reason for Consultation AoCHF  HPI: Rebecca Wolf is a 48yoF with a PMH of HFrEF (20-25% 08/2022, prev 15-20% 08/22/22), mod-severe MR, chronic atrial flutter (amiodarone, dose reduced apixaban), HTN, seizure disorder who presented to [facility] ED 08/30/2022 with shortness of breath and worsening peripheral edema.  She was diuresed with IV Lasix with clinical improvement, EF this admission shows marginal improvement but remains overall poor.  Interval history: -Sitting upright in bed with daughter present at bedside, eyes closed and resting after working with occupational therapy. -Daughter reports significant improvement in the patient's breathing and peripheral edema -Patient awakens easily to voice, denies pain or other complaints, eager to leave the hospital. -Discussed echo results and medications in detail with the patient's daughter  Review of systems complete and found to be negative unless listed above     Past Medical History:  Diagnosis Date   Asthma    Breast cancer ([place]) 2003   LT LUMPECTOMY   Breast cancer ([place]) 2004   LT LUMPECTOMY   Bronchiectasis ([place])    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   GERD (gastroesophageal reflux disease)    Hemorrhoids    Hypertension    Personal history of chemotherapy 2004   BREAST CA   Personal history of radiation therapy 2003   BREAST CA   Personal history of radiation therapy 2004   BREAST CA   Polyp of colon 2002   bleeding polyp of right ascending colon   PUD (peptic ulcer disease)    Seizures ([place])    epilepsy well controlled    Past Surgical History:  Procedure Laterality Date   BREAST EXCISIONAL BIOPSY Left 2003   positive   BREAST EXCISIONAL BIOPSY Left 2004   positive    BREAST LUMPECTOMY Left 2003   BREAST CA   BREAST LUMPECTOMY Left 2004   BREAST CA   EMBOLIZATION N/A 01/15/2022   Procedure: EMBOLIZATION;  Surgeon: Katha Cabal, MD;  Location: [facility];  Service: Cardiovascular;  Laterality: N/A;   HEMICOLECTOMY Right    bowel obstruction   HEMORRHOID SURGERY     THORACOTOMY/LOBECTOMY  1999   carcinoid tumor   TOTAL VAGINAL HYSTERECTOMY     VARICOSE VEIN SURGERY      Medications Prior to Admission  Medication Sig Dispense Refill Last Dose   acetaminophen (TYLENOL) 500 MG tablet Take 500 mg by mouth every 6 (six) hours as needed.   unknown   amiodarone (PACERONE) 200 MG tablet Take 200 mg by mouth daily.   08/30/2022   amoxicillin-clavulanate (AUGMENTIN) 875-125 MG tablet Take 1 tablet by mouth 2 (two) times daily for 10 days. 20 tablet 0 08/30/2022   apixaban (ELIQUIS) 2.5 MG TABS tablet Take 2.5 mg by mouth 2 (two) times daily.   08/30/2022 at 1130   azelastine (ASTELIN) 0.1 % nasal spray U 1 SPRAY IEN BID TO REPLACE PATANASE   08/29/2022   budesonide (PULMICORT) 0.5 MG/2ML nebulizer solution Inhale 0.5 mg into the lungs 2 (two) times daily as needed.   08/30/2022   fluticasone (FLONASE) 50 MCG/ACT nasal spray Place 2 sprays into the nose daily.   08/30/2022   furosemide (LASIX) 20 MG tablet Take 1 tablet (20 mg total) by mouth 2 (two) times daily for  5 days. 10 tablet 0 08/30/2022   levETIRAcetam (KEPPRA) 750 MG tablet Take 1 tablet by mouth 2 (two) times daily.   08/30/2022   magnesium oxide (MAG-OX) 400 (240 Mg) MG tablet Take 1 tablet by mouth 2 (two) times daily.   08/30/2022   montelukast (SINGULAIR) 10 MG tablet Take 10 mg by mouth at bedtime.   08/29/2022   pantoprazole (PROTONIX) 40 MG tablet Take 1 tablet (40 mg total) by mouth daily.   08/30/2022   potassium chloride SA (KLOR-CON M) 20 MEQ tablet Take 1 tablet (20 mEq total) by mouth 2 (two) times daily for 5 days. 10 tablet 0 08/30/2022   predniSONE (DELTASONE) 5 MG tablet Take 10 mg by mouth  daily with breakfast.   08/30/2022   spironolactone (ALDACTONE) 25 MG tablet Take 25 mg by mouth daily.   08/30/2022   albuterol (ACCUNEB) 1.25 MG/3ML nebulizer solution Inhale 1 ampule into the lungs 4 (four) times daily as needed. (Patient not taking: Reported on 08/30/2022)   Not Taking   COVID-19 mRNA vaccine 2023-2024 (COMIRNATY) syringe Inject into the muscle. 0.3 mL 0    influenza vaccine adjuvanted (FLUAD) 0.5 ML injection Inject into the muscle. 0.5 mL 0    levalbuterol (XOPENEX) 0.63 MG/3ML nebulizer solution VVN Q 4 H PRN FOR WHZ (Patient not taking: Reported on 08/30/2022)   Not Taking   predniSONE (DELTASONE) 2.5 MG tablet Take 2.5 mg by mouth daily with breakfast. (Patient not taking: Reported on 08/30/2022)   Not Taking   Social History   Socioeconomic History   Marital status: Married    Spouse name: Not on file   Number of children: Not on file   Years of education: Not on file   Highest education level: Not on file  Occupational History   Not on file  Tobacco Use   Smoking status: Former   Smokeless tobacco: Never  Vaping Use   Vaping Use: Never used  Substance and Sexual Activity   Alcohol use: No    Alcohol/week: 0.0 standard drinks of alcohol   Drug use: No   Sexual activity: Not Currently  Other Topics Concern   Not on file  Social History Narrative   Not on file   Social Determinants of Health   Financial Resource Strain: Not on file  Food Insecurity: No Food Insecurity (08/30/2022)   Hunger Vital Sign    Worried About Running Out of Food in the Last Year: Never true    Ran Out of Food in the Last Year: Never true  Transportation Needs: No Transportation Needs (08/30/2022)   PRAPARE - Hydrologist (Medical): No    Lack of Transportation (Non-Medical): No  Physical Activity: Not on file  Stress: Not on file  Social Connections: Not on file  Intimate Partner Violence: Not At Risk (08/30/2022)   Humiliation, Afraid, Rape, and Kick  questionnaire    Fear of Current or Ex-Partner: No    Emotionally Abused: No    Physically Abused: No    Sexually Abused: No    Family History  Problem Relation Age of Onset   Alcoholism Father    Diabetes Sister    Breast cancer Neg Hx       Intake/Output Summary (Last 24 hours) at 09/03/2022 1311 Last data filed at 09/03/2022 1103 Gross per 24 hour  Intake 434 ml  Output 750 ml  Net -316 ml    Vitals:   09/03/22 0415 09/03/22 0500 09/03/22  [id number] 09/03/22 1123  BP: 102/63  122/65 (!) 106/56  Pulse: 95  83 (!) 47  Resp: '18  18 14  '$ Temp: 98.6 F (37 C)   98.5 F (36.9 C)  TempSrc:      SpO2: 93%  97% 96%  Weight:  71.8 kg    Height:        PHYSICAL EXAM General: elderly and ill appearing black female , in no acute distress.  Sitting upright in bed with daughter at bedside HEENT:  Normocephalic and atraumatic. Neck:  No JVD.  Lungs: Normal respiratory effort on room air.  Decreased breath sounds with left basilar crackles, no wheezes.   Heart: Irregularly irregular with controlled rate. Normal S1 and S2 without gallops or murmurs.  Abdomen: Non-distended appearing.  Msk: Normal strength and tone for age. Extremities: Chronic lymphedema, trace bilateral peripheral edema with chronic velvety appearing patches on both ankles.  Skin dimpling and wrinkling, overall improvement in edema compared to admission per the patient's daughter. Neuro: Lethargic, opens eyes to voice and light touch, falls asleep quickly during interview. Psych:  Answers simple questions .   Labs: Basic Metabolic Panel: Recent Labs    09/02/22 0606 09/03/22 0632  NA 139 135  K 4.3 4.1  CL 104 103  CO2 27 26  GLUCOSE 108* 107*  BUN 14 18  CREATININE 0.96 1.09*  CALCIUM 8.4* 8.3*  MG 2.4 2.0   Liver Function Tests: Recent Labs    09/02/22 0606 09/03/22 0632  AST 24 21  ALT 14 13  ALKPHOS 109 103  BILITOT 0.3 0.7  PROT 6.2* 5.7*  ALBUMIN 2.7* 2.5*   No results for input(s): "LIPASE",  "AMYLASE" in the last 72 hours. CBC: Recent Labs    09/03/22 1002  WBC 6.4  HGB 10.1*  HCT 32.5*  MCV 74.2*  PLT 266   Cardiac Enzymes: No results for input(s): "CKTOTAL", "CKMB", "CKMBINDEX", "TROPONINIHS" in the last 72 hours. BNP: No results for input(s): "BNP" in the last 72 hours. D-Dimer: No results for input(s): "DDIMER" in the last 72 hours. Hemoglobin A1C: No results for input(s): "HGBA1C" in the last 72 hours. Fasting Lipid Panel: No results for input(s): "CHOL", "HDL", "LDLCALC", "TRIG", "CHOLHDL", "LDLDIRECT" in the last 72 hours. Thyroid Function Tests: No results for input(s): "TSH", "T4TOTAL", "T3FREE", "THYROIDAB" in the last 72 hours.  Invalid input(s): "FREET3" Anemia Panel: No results for input(s): "VITAMINB12", "FOLATE", "FERRITIN", "TIBC", "IRON", "RETICCTPCT" in the last 72 hours.   Radiology: [facility] Chest Port 1 View  Result Date: 09/03/2022 CLINICAL DATA:  Cough EXAM: PORTABLE CHEST 1 VIEW COMPARISON:  CXR 08/30/22 FINDINGS: Unchanged small left-sided pleural effusion. No right-sided pleural effusion. No pneumothorax. Compared to prior exam there are new patchy bilateral airspace opacity, particularly in the right upper lung. No radiographically apparent displaced rib fractures. Visualized upper abdomen is unremarkable. IMPRESSION: 1. New patchy bilateral airspace opacity, particularly in the right upper lung, concerning for multifocal pneumonia. 2. Unchanged small left-sided pleural effusion. Electronically Signed   By: Marin Roberts M.D.   On: 09/03/2022 09:25   ECHOCARDIOGRAM COMPLETE  Result Date: 09/02/2022    ECHOCARDIOGRAM REPORT   Patient Name:   Rebecca Wolf Date of Exam: 09/02/2022 Medical Rec #:  [id number]     Height:       64.0 in Accession #:    [id number]    Weight:       157.8 lb Date of Birth:  Jun 25, 1925      BSA:  1.769 m Patient Age:    79 years      BP:           101/64 mmHg Patient Gender: F             HR:           47 bpm. Exam Location:   [facility] Procedure: 2D Echo, Cardiac Doppler and Color Doppler Indications:     CHF I50.9  History:         Patient has no prior history of Echocardiogram examinations.                  Risk Factors:Hypertension.  Sonographer:     Sherrie Sport Referring Phys:  [place] Diagnosing Phys: Yolonda Kida MD IMPRESSIONS  1. Severely depressed LVF Globally.  2. Left ventricular ejection fraction, by estimation, is 20 to 25%. The left ventricle has severely decreased function. The left ventricle demonstrates global hypokinesis. The left ventricular internal cavity size was moderately dilated. There is moderate concentric left ventricular hypertrophy. Left ventricular diastolic parameters are consistent with Grade I diastolic dysfunction (impaired relaxation).  3. Right ventricular systolic function is moderately reduced. The right ventricular size is moderately enlarged. Mildly increased right ventricular wall thickness.  4. Left atrial size was severely dilated.  5. Right atrial size was moderately dilated.  6. The mitral valve is grossly normal. Moderate to severe mitral valve regurgitation.  7. Tricuspid valve regurgitation is mild to moderate.  8. The aortic valve is calcified. Aortic valve regurgitation is trivial. Aortic valve sclerosis/calcification is present, without any evidence of aortic stenosis. FINDINGS  Left Ventricle: Left ventricular ejection fraction, by estimation, is 20 to 25%. The left ventricle has severely decreased function. The left ventricle demonstrates global hypokinesis. The left ventricular internal cavity size was moderately dilated. There is moderate concentric left ventricular hypertrophy. Abnormal (paradoxical) septal motion, consistent with left bundle branch block. Left ventricular diastolic parameters are consistent with Grade I diastolic dysfunction (impaired relaxation). Right Ventricle: The right ventricular size is moderately enlarged. Mildly increased right ventricular wall  thickness. Right ventricular systolic function is moderately reduced. Left Atrium: Left atrial size was severely dilated. Right Atrium: Right atrial size was moderately dilated. Pericardium: There is no evidence of pericardial effusion. Mitral Valve: The mitral valve is grossly normal. Moderate to severe mitral valve regurgitation. Tricuspid Valve: The tricuspid valve is grossly normal. Tricuspid valve regurgitation is mild to moderate. Aortic Valve: The aortic valve is calcified. Aortic valve regurgitation is trivial. Aortic valve sclerosis/calcification is present, without any evidence of aortic stenosis. Aortic valve mean gradient measures 3.7 mmHg. Aortic valve peak gradient measures 6.8 mmHg. Aortic valve area, by VTI measures 1.62 cm. Pulmonic Valve: The pulmonic valve was grossly normal. Pulmonic valve regurgitation is trivial. Aorta: The aortic root, ascending aorta and aortic arch are all structurally normal, with no evidence of dilitation or obstruction and the ascending aorta was not well visualized. IAS/Shunts: No atrial level shunt detected by color flow Doppler. Additional Comments: Severely depressed LVF Globally.  LEFT VENTRICLE PLAX 2D LVIDd:         4.80 cm      Diastology LVIDs:         4.20 cm      LV e' lateral:   16.80 cm/s LV PW:         1.00 cm      LV E/e' lateral: 5.9 LV IVS:        1.40 cm LVOT  diam:     2.00 cm LV SV:         28 LV SV Index:   16 LVOT Area:     3.14 cm  LV Volumes (MOD) LV vol d, MOD A2C: 108.0 ml LV vol d, MOD A4C: 93.4 ml LV vol s, MOD A2C: 53.1 ml LV vol s, MOD A4C: 86.0 ml LV SV MOD A2C:     54.9 ml LV SV MOD A4C:     93.4 ml LV SV MOD BP:      36.0 ml RIGHT VENTRICLE RV Basal diam:  3.60 cm RV Mid diam:    3.10 cm RV S prime:     10.90 cm/s TAPSE (M-mode): 1.6 cm LEFT ATRIUM             Index        RIGHT ATRIUM           Index LA diam:        3.70 cm 2.09 cm/m   RA Area:     11.30 cm LA Vol (A2C):   52.5 ml 29.68 ml/m  RA Volume:   21.00 ml  11.87 ml/m LA Vol  (A4C):   51.1 ml 28.88 ml/m LA Biplane Vol: 56.1 ml 31.71 ml/m  AORTIC VALVE AV Area (Vmax):    1.56 cm AV Area (Vmean):   1.50 cm AV Area (VTI):     1.62 cm AV Vmax:           130.00 cm/s AV Vmean:          86.700 cm/s AV VTI:            0.171 m AV Peak Grad:      6.8 mmHg AV Mean Grad:      3.7 mmHg LVOT Vmax:         64.70 cm/s LVOT Vmean:        41.300 cm/s LVOT VTI:          0.088 m LVOT/AV VTI ratio: 0.52  AORTA Ao Root diam: 2.90 cm MITRAL VALVE               TRICUSPID VALVE MV Area (PHT): 4.83 cm    TR Peak grad:   30.0 mmHg MV Decel Time: 157 msec    TR Vmax:        274.00 cm/s MV E velocity: 99.60 cm/s                            SHUNTS                            Systemic VTI:  0.09 m                            Systemic Diam: 2.00 cm Yolonda Kida MD Electronically signed by Yolonda Kida MD Signature Date/Time: 09/02/2022/6:31:53 PM    Final    CT Angio Chest PE W and/or Wo Contrast  Result Date: 08/30/2022 CLINICAL DATA:  Shortness of breath.  Denies chest pain. EXAM: CT ANGIOGRAPHY CHEST WITH CONTRAST TECHNIQUE: Multidetector CT imaging of the chest was performed using the standard protocol during bolus administration of intravenous contrast. Multiplanar CT image reconstructions and MIPs were obtained to evaluate the vascular anatomy. RADIATION DOSE REDUCTION: This exam was performed according to the departmental dose-optimization program which includes automated  exposure control, adjustment of the mA and/or kV according to patient size and/or use of iterative reconstruction technique. CONTRAST:  61m OMNIPAQUE IOHEXOL 350 MG/ML SOLN COMPARISON:  Chest x-ray from same day. CT chest dated June 29, 2017. FINDINGS: Cardiovascular: Satisfactory opacification of the pulmonary arteries to the segmental level. No evidence of pulmonary embolism. Chronic cardiomegaly. No pericardial effusion. No thoracic aortic aneurysm. Coronary, aortic arch, and branch vessel atherosclerotic vascular disease.  Mediastinum/Nodes: No enlarged mediastinal, hilar, or axillary lymph nodes. Unchanged 2.7 x 1.7 cm right paratracheal nodule, likely postsurgical. Thyroid gland, trachea, and esophagus demonstrate no significant findings. Lungs/Pleura: Small left-greater-than-right pleural effusions. Left lower lobe subsegmental atelectasis. Unchanged scarring and bronchiectasis in the left-greater-than-right upper lobes. No consolidation or pneumothorax. Unchanged right upper and middle lobe lung nodules measuring up 6 mm. No follow-up imaging is recommended. Upper Abdomen: No acute abnormality. Musculoskeletal: No chest wall abnormality. No acute or significant osseous findings. Review of the MIP images confirms the above findings. IMPRESSION: 1. No evidence of pulmonary embolism. 2. Small left-greater-than-right pleural effusions. 3.  Aortic atherosclerosis (ICD10-I70.0). Electronically Signed   By: WTitus DubinM.D.   On: 08/30/2022 17:12   DG Chest 2 View  Result Date: 08/30/2022 CLINICAL DATA:  Labored breathing EXAM: CHEST - 2 VIEW COMPARISON:  Chest radiograph 2 days prior FINDINGS: Mild cardiomegaly is unchanged. The upper mediastinal contours are stable. There is worsened aeration of the left base since the study from 1 day prior with a possible small left pleural effusion. The left upper lobe remains well-aerated. The right lung is clear. There is no right effusion. There is no pneumothorax There is no acute osseous abnormality. Scoliotic curvature of the spine is unchanged. IMPRESSION: Worsened aeration of the left lung with a possible small left pleural effusion. Findings may reflect developing infection in the correct clinical setting. Electronically Signed   By: PValetta MoleM.D.   On: 08/30/2022 15:28   DG Chest 2 View  Result Date: 08/27/2022 CLINICAL DATA:  Shortness of breath. EXAM: CHEST - 2 VIEW COMPARISON:  Chest radiographs 07/01/2022 in 01/19/2022 FINDINGS: Cardiac silhouette is again mildly  enlarged. Mediastinal contours are unchanged and within normal limits. Surgical clips again overlie the right hilum. There is increased left mid and lower lung interstitial thickening and new patchy heterogeneous airspace opacification. No definite pleural effusion. No pneumothorax. No acute skeletal abnormality. Left upper abdominal quadrant embolization coils are again seen. IMPRESSION: Increased left mid and lower lung interstitial thickening and patchy heterogeneous airspace opacification, suspicious for pneumonia. Electronically Signed   By: RYvonne KendallM.D.   On: 08/27/2022 15:39    ECHO 20-25%  TELEMETRY reviewed by me (LT) 09/03/2022 : AFL rate 60s-70s  EKG reviewed by me: AFL 1* AV block rate 60  Data reviewed by me (LT) 09/03/2022: hospitalist progress note, nursing notes, last 24h vitals tele labs imaging I/O    Principal Problem:   SOB (shortness of breath) Active Problems:   Pulmonary embolism (HCC)   Seizure disorder (HCC)   CKD stage G3b/A1, GFR 30-44 and albumin creatinine ratio <30 mg/g (HCC)   Diabetes mellitus with no complication (HCC)   Anemia   Chronic systolic CHF (congestive heart failure) (HCC)   Electrolyte abnormality   Acute on chronic HFrEF (heart failure with reduced [place]) [place]   Malnutrition of moderate degree    ASSESSMENT AND PLAN:  Rebecca Wolf a 89yoFwith a PMH of HFrEF (20-25% 08/2022, prev 15-20% 08/22/22), mod-severe MR, chronic atrial  flutter (amiodarone, dose reduced apixaban), HTN, seizure disorder who presented to [facility] ED 08/30/2022 with shortness of breath and worsening peripheral edema.  She was diuresed with IV Lasix with clinical improvement, EF this admission shows marginal improvement but remains overall poor.  # Acute on chronic HFrEF # Moderate to severe MR Clinical improvement following IV diuresis with IV Lasix twice daily.  Was previously taking 20 mg of p.o. Lasix daily at home -Continue IV Lasix for this morning dose,  restart 40 mg p.o. Lasix once daily tomorrow -Continue GDMT with losartan 25 mg daily and spironolactone 25 mg once daily -No BB with baseline bradycardia -Consider SGLT2i as renal function allows -No further cardiac diagnostics necessary, ok for discharge from a cardiac perspective today. Pending dispo to SNF.   # Chronic atrial flutter Rate controlled on telemetry -Continue amiodarone 100 mg once daily and Eliquis 2.5 mg twice daily. Dose reduced per hematology (hx GIB).  CHA2DS2-VASc 7 (age, sex, hx VTE, CHF, HTN)  # Seizure disorder On keppra per primary team   [place] for discharge today from a cardiac perspective. Will arrange for follow up in clinic with Dr. Clayborn Bigness in 1-2 weeks.    This patient's plan of care was discussed and created with Dr. Clayborn Bigness and he is in agreement.  Signed: Tristan Schroeder , PA-C 09/03/2022, 1:11 PM [facility]

## 2022-09-04 LAB — CBC WITH DIFFERENTIAL/PLATELET
Abs Immature Granulocytes: 0.04 10*3/uL (ref 0.00–0.07)
Basophils Absolute: 0.1 10*3/uL (ref 0.0–0.1)
Basophils Relative: 1 %
Eosinophils Absolute: 0.2 10*3/uL (ref 0.0–0.5)
Eosinophils Relative: 4 %
HCT: 34.8 % — ABNORMAL LOW (ref 36.0–46.0)
Hemoglobin: 10.8 g/dL — ABNORMAL LOW (ref 12.0–15.0)
Immature Granulocytes: 1 %
Lymphocytes Relative: 32 %
Lymphs Abs: 2.2 10*3/uL (ref 0.7–4.0)
MCH: 23.2 pg — ABNORMAL LOW (ref 26.0–34.0)
MCHC: 31 g/dL (ref 30.0–36.0)
MCV: 74.8 fL — ABNORMAL LOW (ref 80.0–100.0)
Monocytes Absolute: 0.6 10*3/uL (ref 0.1–1.0)
Monocytes Relative: 9 %
Neutro Abs: 3.7 10*3/uL (ref 1.7–7.7)
Neutrophils Relative %: 53 %
Platelets: 290 10*3/uL (ref 150–400)
RBC: 4.65 MIL/uL (ref 3.87–5.11)
RDW: 17.9 % — ABNORMAL HIGH (ref 11.5–15.5)
WBC: 6.8 10*3/uL (ref 4.0–10.5)
nRBC: 0 % (ref 0.0–0.2)

## 2022-09-04 LAB — COMPREHENSIVE METABOLIC PANEL
ALT: 15 U/L (ref 0–44)
AST: 23 U/L (ref 15–41)
Albumin: 2.7 g/dL — ABNORMAL LOW (ref 3.5–5.0)
Alkaline Phosphatase: 115 U/L (ref 38–126)
Anion gap: 10 (ref 5–15)
BUN: 19 mg/dL (ref 8–23)
CO2: 26 mmol/L (ref 22–32)
Calcium: 8.4 mg/dL — ABNORMAL LOW (ref 8.9–10.3)
Chloride: 104 mmol/L (ref 98–111)
Creatinine, Ser: 1.27 mg/dL — ABNORMAL HIGH (ref 0.44–1.00)
GFR, Estimated: 39 mL/min — ABNORMAL LOW (ref >60)
Glucose, Bld: 107 mg/dL — ABNORMAL HIGH (ref 70–99)
Potassium: 3.7 mmol/L (ref 3.5–5.1)
Sodium: 140 mmol/L (ref 135–145)
Total Bilirubin: 0.6 mg/dL (ref 0.3–1.2)
Total Protein: 6.1 g/dL — ABNORMAL LOW (ref 6.5–8.1)

## 2022-09-04 LAB — GLUCOSE, CAPILLARY
Glucose-Capillary: 99 mg/dL (ref 70–99)
Glucose-Capillary: 140 mg/dL — ABNORMAL HIGH (ref 70–99)
Glucose-Capillary: 135 mg/dL — ABNORMAL HIGH (ref 70–99)
Glucose-Capillary: 152 mg/dL — ABNORMAL HIGH (ref 70–99)

## 2022-09-04 LAB — MAGNESIUM: Magnesium: 1.9 mg/dL (ref 1.7–2.4)

## 2022-09-04 MED ORDER — MAGNESIUM SULFATE 2 GM/50ML IV SOLN
2.0000 g | Freq: Once | INTRAVENOUS | Status: AC
  Administered 2022-09-04: 2 g via INTRAVENOUS
  Filled 2022-09-04: qty 50

## 2022-09-04 MED ORDER — AMIODARONE HCL 200 MG PO TABS
100.0000 mg | ORAL_TABLET | Freq: Every day | ORAL | Status: DC
  Administered 2022-09-05 – 2022-09-06 (×2): 100 mg via ORAL
  Filled 2022-09-04 (×2): qty 1

## 2022-09-04 MED ORDER — SPIRONOLACTONE 12.5 MG HALF TABLET
12.5000 mg | ORAL_TABLET | Freq: Every day | ORAL | Status: DC
  Administered 2022-09-05 – 2022-09-06 (×2): 12.5 mg via ORAL
  Filled 2022-09-04 (×2): qty 1

## 2022-09-04 MED ORDER — LEVETIRACETAM 500 MG PO TABS
500.0000 mg | ORAL_TABLET | Freq: Two times a day (BID) | ORAL | Status: DC
  Administered 2022-09-04 – 2022-09-06 (×4): 500 mg via ORAL
  Filled 2022-09-04 (×4): qty 1

## 2022-09-04 NOTE — TOC Progression Note (Signed)
Transition of Care [facility]) - Progression Note    Patient Details  Name: Rebecca Wolf MRN: [id number] Date of Birth: [birth year]  Transition of Care [facility]) CM/SW [place], RN Phone Number: 09/04/2022, 2:51 PM  Clinical Narrative:   Reached out to [facility] to ask if they will review for a bed offer, awaiting a response     Expected Discharge Plan: [place] Barriers to Discharge: Continued Medical Work up  Expected Discharge Plan and [place] arrangements for the past 2 months: Single Family Home                                       Social Determinants of Health (SDOH) Interventions SDOH Screenings   Food Insecurity: No Food Insecurity (08/30/2022)  Housing: Low Risk  (08/30/2022)  Transportation Needs: No Transportation Needs (08/30/2022)  Utilities: Not At Risk (08/30/2022)  Tobacco Use: Medium Risk (09/02/2022)    Readmission Risk Interventions     No data to display

## 2022-09-04 NOTE — Progress Notes (Signed)
PROGRESS NOTE    Rebecca Wolf   [id number] DOB: 1925-05-10  DOA: 08/30/2022 Date of Service: 09/04/22 PCP: Erline Levine, MD     Brief Narrative / Hospital Course:  89 years old female with medical history of hypertension, seizure disorder, CHF with EF of 20% and GERD Presented to the ED 08/30/2022 with complaint of shortness of breath.  Patient reports shortness of breath lasted about 30 minutes and then resolved.  Patient was asymptomatic on presentation in the ED.  As per daughter patient momentarily gets short of breath when she has to use the restroom  03/01: admitted to hospitalist service for CHF 03/02: bradycardic on amiodarone 200 mg daily.    03/03: patient had an episode of seizure.  This was witnessed by patient's daughter in the room.  According to patient's daughter, neurologist was weaning patient off  Keppra.  Patient seen by neurologist who recommended going back on patient's previous Keppra dose of 750 twice daily.  03/04: stable, PT/OT recs for SNF  03/05: plan for SNF, auth pending, TOC working w/ family on placement, see notes. Cardiology team has cleared for discharge, to follow in office w/ Dr Clayborn Bigness in 1-2 weeks.  03/06: SNF placement pending   Consultants:  Cardiology  Neurology   Procedures: none  Pertinent results:  Most recent 2D Echo shows EF of 15% at [place] C/W reduced ejection fraction heart failure     ASSESSMENT & PLAN:   Principal Problem:   SOB (shortness of breath) Active Problems:   Chronic systolic CHF (congestive heart failure) (HCC)   Seizure disorder (HCC)   Pulmonary embolism (HCC)   CKD stage G3b/A1, GFR 30-44 and albumin creatinine ratio <30 mg/g (HCC)   Diabetes mellitus with no complication (HCC)   Anemia   Electrolyte abnormality   Acute on chronic HFrEF (heart failure with reduced ejection fraction) (HCC)   Malnutrition of moderate degree  Acute on chronic HFrEF Moderate to severe MR Shortness of  breath d/t above  Clinical improvement following IV diuresis with IV Lasix twice daily.  Was previously taking 20 mg of p.o. Lasix daily at home restart 40 mg p.o. Lasix once daily today 09/04/22  GDMT with losartan 25 mg daily and spironolactone 25 mg once daily no BB with baseline bradycardia Consider SGLT2i as renal function allows No further cardiac diagnostics necessary ok for discharge from a cardiac perspective per note 03/05  Community-acquired pneumonia, present on admission SOB d/t above Chest x-ray obtained showed new infiltrates on the right side Continue ceftriaxone and azithromycin   Chronic atrial flutter Rate controlled on telemetry CHA2DS2-VASc 7 (age, sex, hx VTE, CHF, HTN) Continue amiodarone 100 mg once daily  Eliquis 2.5 mg twice daily. Dose reduced per hematology (hx GIB).     Seizure disorder Acute seizure event 09/01/2022 At home patient takes Keppra 375 mg bid, weaning down IV Keppra has been switched to oral Keppra 750 twice daily  Patient will be discharged on 750 mg of Keppra twice daily Seizure precaution.  Neurologist on board - appreciate input  Hypomagnesemia Hypokalemia Now resolved  continue to monitor levels  Normocytic anemia No indication for transfusion at this time Continue to monitor CBC   Diabetes mellitus with no complications Continue to monitor glucose level Continue current insulin regimen   Hx Pulmonary embolism  Daughter said pt has h/o VTE and is on eliquis.  CTA neg for PE Continue Eliquis   DVT prophylaxis: Eliquis Pertinent IV fluids/nutrition: no conitnuous IV fluids Central  lines / invasive devices: none  Code Status: FULL CODE   Current Admission Status: inpatietn  TOC needs / Dispo plan: SNF Barriers to discharge / significant pending items: placement              Subjective / Brief ROS:  Patient reports no concerns other than wants to go home  Denies CP/SOB.    Family Communication:  daughter at bedside on rounds     Objective Findings:  Vitals:   09/04/22 0823 09/04/22 0835 09/04/22 0942 09/04/22 1628  BP:  130/73  (!) 109/45  Pulse:  (!) 42 (!) 45 88  Resp:  14  16  Temp:  97.6 F (36.4 C)  97.6 F (36.4 C)  TempSrc:      SpO2: 96% 98%  98%  Weight:      Height:        Intake/Output Summary (Last 24 hours) at 09/04/2022 1700 Last data filed at 09/04/2022 1327 Gross per 24 hour  Intake 523.63 ml  Output --  Net 523.63 ml   Filed Weights   09/01/22 0500 09/03/22 0500 09/04/22 0705  Weight: 71.6 kg 71.8 kg 68.7 kg    Examination:  Physical Exam Constitutional:      General: She is not in acute distress. Cardiovascular:     Rate and Rhythm: Normal rate and regular rhythm.  Pulmonary:     Effort: Pulmonary effort is normal.     Breath sounds: Examination of the right-lower field reveals decreased breath sounds. Examination of the left-lower field reveals decreased breath sounds. Decreased breath sounds present.  Skin:    General: Skin is warm and dry.  Neurological:     General: No focal deficit present.     Mental Status: She is alert. She is disoriented.  Psychiatric:        Mood and Affect: Mood normal.        Behavior: Behavior normal.          Scheduled Medications:   [START ON 09/05/2022] amiodarone  100 mg Oral Daily   apixaban  2.5 mg Oral BID   ascorbic acid  500 mg Oral BID   azithromycin  500 mg Oral Daily   budesonide  0.5 mg Inhalation BID   feeding supplement (GLUCERNA SHAKE)  237 mL Oral TID BM   fluticasone  2 spray Each Nare Daily   furosemide  20 mg Oral Daily   insulin aspart  0-5 Units Subcutaneous QHS   insulin aspart  0-9 Units Subcutaneous TID WC   levETIRAcetam  500 mg Oral BID   losartan  25 mg Oral Daily   montelukast  10 mg Oral QHS   multivitamin with minerals  1 tablet Oral Daily   pantoprazole  40 mg Oral Daily   sodium chloride flush  3 mL Intravenous Q12H   [START ON 09/05/2022] spironolactone  12.5 mg  Oral Daily   zinc sulfate  220 mg Oral Daily    Continuous Infusions:  cefTRIAXone (ROCEPHIN)  IV 200 mL/hr at 09/03/22 1800    PRN Medications:  acetaminophen **OR** acetaminophen, bisacodyl, hydrALAZINE, polyethylene glycol  Antimicrobials from admission:  Anti-infectives (From admission, onward)    Start     Dose/Rate Route Frequency Ordered Stop   09/03/22 1800  cefTRIAXone (ROCEPHIN) 1 g in sodium chloride 0.9 % 100 mL IVPB        1 g 200 mL/hr over 30 Minutes Intravenous Every 24 hours 09/03/22 1700     09/03/22 1800  azithromycin [facility]) tablet 500 mg        500 mg Oral Daily 09/03/22 1700 09/06/22 1759           Data Reviewed:  I have personally reviewed the following...  CBC: Recent Labs  Lab 08/30/22 1501 08/31/22 0437 09/03/22 1002 09/04/22 0510  WBC 5.3 6.0 6.4 6.8  NEUTROABS  --   --   --  3.7  HGB 10.8* 9.7* 10.1* 10.8*  HCT 35.7* 31.5* 32.5* 34.8*  MCV 76.9* 75.0* 74.2* 74.8*  PLT 248 227 266 [id number]   Basic Metabolic Panel: Recent Labs  Lab 08/31/22 0437 09/01/22 0434 09/01/22 1856 09/02/22 0606 09/03/22 0632 09/04/22 0510  NA 138 139  --  139 135 140  K 3.7 3.4*  --  4.3 4.1 3.7  CL 105 105  --  104 103 104  CO2 23 25  --  '27 26 26  '$ GLUCOSE 128* 106*  --  108* 107* 107*  BUN 11 15  --  '14 18 19  '$ CREATININE 0.96 0.98  --  0.96 1.09* 1.27*  CALCIUM 8.4* 8.1*  --  8.4* 8.3* 8.4*  MG  --  1.3* 3.2* 2.4 2.0 1.9   GFR: Estimated Creatinine Clearance: 24.7 mL/min (A) (by C-G formula based on SCr of 1.27 mg/dL (H)). Liver Function Tests: Recent Labs  Lab 08/30/22 1501 08/31/22 0437 09/02/22 0606 09/03/22 0632 09/04/22 0510  AST '22 24 24 21 23  '$ ALT '15 14 14 13 15  '$ ALKPHOS 135* 111 109 103 115  BILITOT 0.4 0.5 0.3 0.7 0.6  PROT 6.8 5.9* 6.2* 5.7* 6.1*  ALBUMIN 2.8* 2.4* 2.7* 2.5* 2.7*   No results for input(s): "LIPASE", "AMYLASE" in the last 168 hours. No results for input(s): "AMMONIA" in the last 168 hours. Coagulation  Profile: No results for input(s): "INR", "PROTIME" in the last 168 hours. Cardiac Enzymes: No results for input(s): "CKTOTAL", "CKMB", "CKMBINDEX", "TROPONINI" in the last 168 hours. BNP (last 3 results) No results for input(s): "PROBNP" in the last 8760 hours. HbA1C: No results for input(s): "HGBA1C" in the last 72 hours. CBG: Recent Labs  Lab 09/03/22 1710 09/03/22 2216 09/04/22 0823 09/04/22 1147 09/04/22 1643  GLUCAP 161* 94 99 140* 135*   Lipid Profile: No results for input(s): "CHOL", "HDL", "LDLCALC", "TRIG", "CHOLHDL", "LDLDIRECT" in the last 72 hours. Thyroid Function Tests: No results for input(s): "TSH", "T4TOTAL", "FREET4", "T3FREE", "THYROIDAB" in the last 72 hours. Anemia Panel: No results for input(s): "VITAMINB12", "FOLATE", "FERRITIN", "TIBC", "IRON", "RETICCTPCT" in the last 72 hours. Most Recent Urinalysis On File:     Component Value Date/Time   COLORURINE YELLOW (A) 08/30/2022 1826   APPEARANCEUR TURBID (A) 08/30/2022 1826   APPEARANCEUR HAZY 04/19/2013 1133   LABSPEC 1.034 (H) 08/30/2022 1826   LABSPEC 1.015 04/19/2013 1133   PHURINE 5.0 08/30/2022 1826   GLUCOSEU NEGATIVE 08/30/2022 1826   GLUCOSEU NEGATIVE 04/19/2013 1133   HGBUR NEGATIVE 08/30/2022 1826   BILIRUBINUR NEGATIVE 08/30/2022 1826   BILIRUBINUR meg 02/13/2017 [place] 04/19/2013 1133   KETONESUR NEGATIVE 08/30/2022 1826   PROTEINUR NEGATIVE 08/30/2022 1826   UROBILINOGEN 0.2 02/13/2017 1155   NITRITE NEGATIVE 08/30/2022 1826   LEUKOCYTESUR NEGATIVE 08/30/2022 1826   LEUKOCYTESUR 2+ 04/19/2013 1133   Sepsis Labs: '@LABRCNTIP'$ (procalcitonin:4,lacticidven:4) Microbiology: No results found for this or any previous visit (from the past 240 hour(s)).    Radiology Studies last 3 days: DG Chest Port 1 View  Result Date: 09/03/2022 CLINICAL DATA:  Cough  EXAM: PORTABLE CHEST 1 VIEW COMPARISON:  CXR 08/30/22 FINDINGS: Unchanged small left-sided pleural effusion. No  right-sided pleural effusion. No pneumothorax. Compared to prior exam there are new patchy bilateral airspace opacity, particularly in the right upper lung. No radiographically apparent displaced rib fractures. Visualized upper abdomen is unremarkable. IMPRESSION: 1. New patchy bilateral airspace opacity, particularly in the right upper lung, concerning for multifocal pneumonia. 2. Unchanged small left-sided pleural effusion. Electronically Signed   By: Marin Roberts M.D.   On: 09/03/2022 09:25   ECHOCARDIOGRAM COMPLETE  Result Date: 09/02/2022    ECHOCARDIOGRAM REPORT   Patient Name:   Rebecca Wolf Date of Exam: 09/02/2022 Medical Rec #:  [id number]     Height:       64.0 in Accession #:    [id number]    Weight:       157.8 lb Date of Birth:  January 18, 1925      BSA:          1.769 m Patient Age:    47 years      BP:           101/64 mmHg Patient Gender: F             HR:           47 bpm. Exam Location:  [facility] Procedure: 2D Echo, Cardiac Doppler and Color Doppler Indications:     CHF I50.9  History:         Patient has no prior history of Echocardiogram examinations.                  Risk Factors:Hypertension.  Sonographer:     Sherrie Sport Referring Phys:  [place] Diagnosing Phys: Yolonda Kida MD IMPRESSIONS  1. Severely depressed LVF Globally.  2. Left ventricular ejection fraction, by estimation, is 20 to 25%. The left ventricle has severely decreased function. The left ventricle demonstrates global hypokinesis. The left ventricular internal cavity size was moderately dilated. There is moderate concentric left ventricular hypertrophy. Left ventricular diastolic parameters are consistent with Grade I diastolic dysfunction (impaired relaxation).  3. Right ventricular systolic function is moderately reduced. The right ventricular size is moderately enlarged. Mildly increased right ventricular wall thickness.  4. Left atrial size was severely dilated.  5. Right atrial size was moderately dilated.  6. The  mitral valve is grossly normal. Moderate to severe mitral valve regurgitation.  7. Tricuspid valve regurgitation is mild to moderate.  8. The aortic valve is calcified. Aortic valve regurgitation is trivial. Aortic valve sclerosis/calcification is present, without any evidence of aortic stenosis. FINDINGS  Left Ventricle: Left ventricular ejection fraction, by estimation, is 20 to 25%. The left ventricle has severely decreased function. The left ventricle demonstrates global hypokinesis. The left ventricular internal cavity size was moderately dilated. There is moderate concentric left ventricular hypertrophy. Abnormal (paradoxical) septal motion, consistent with left bundle branch block. Left ventricular diastolic parameters are consistent with Grade I diastolic dysfunction (impaired relaxation). Right Ventricle: The right ventricular size is moderately enlarged. Mildly increased right ventricular wall thickness. Right ventricular systolic function is moderately reduced. Left Atrium: Left atrial size was severely dilated. Right Atrium: Right atrial size was moderately dilated. Pericardium: There is no evidence of pericardial effusion. Mitral Valve: The mitral valve is grossly normal. Moderate to severe mitral valve regurgitation. Tricuspid Valve: The tricuspid valve is grossly normal. Tricuspid valve regurgitation is mild to moderate. Aortic Valve: The aortic valve is calcified. Aortic valve regurgitation is trivial.  Aortic valve sclerosis/calcification is present, without any evidence of aortic stenosis. Aortic valve mean gradient measures 3.7 mmHg. Aortic valve peak gradient measures 6.8 mmHg. Aortic valve area, by VTI measures 1.62 cm. Pulmonic Valve: The pulmonic valve was grossly normal. Pulmonic valve regurgitation is trivial. Aorta: The aortic root, ascending aorta and aortic arch are all structurally normal, with no evidence of dilitation or obstruction and the ascending aorta was not well visualized.  IAS/Shunts: No atrial level shunt detected by color flow Doppler. Additional Comments: Severely depressed LVF Globally.  LEFT VENTRICLE PLAX 2D LVIDd:         4.80 cm      Diastology LVIDs:         4.20 cm      LV e' lateral:   16.80 cm/s LV PW:         1.00 cm      LV E/e' lateral: 5.9 LV IVS:        1.40 cm LVOT diam:     2.00 cm LV SV:         28 LV SV Index:   16 LVOT Area:     3.14 cm  LV Volumes (MOD) LV vol d, MOD A2C: 108.0 ml LV vol d, MOD A4C: 93.4 ml LV vol s, MOD A2C: 53.1 ml LV vol s, MOD A4C: 86.0 ml LV SV MOD A2C:     54.9 ml LV SV MOD A4C:     93.4 ml LV SV MOD BP:      36.0 ml RIGHT VENTRICLE RV Basal diam:  3.60 cm RV Mid diam:    3.10 cm RV S prime:     10.90 cm/s TAPSE (M-mode): 1.6 cm LEFT ATRIUM             Index        RIGHT ATRIUM           Index LA diam:        3.70 cm 2.09 cm/m   RA Area:     11.30 cm LA Vol (A2C):   52.5 ml 29.68 ml/m  RA Volume:   21.00 ml  11.87 ml/m LA Vol (A4C):   51.1 ml 28.88 ml/m LA Biplane Vol: 56.1 ml 31.71 ml/m  AORTIC VALVE AV Area (Vmax):    1.56 cm AV Area (Vmean):   1.50 cm AV Area (VTI):     1.62 cm AV Vmax:           130.00 cm/s AV Vmean:          86.700 cm/s AV VTI:            0.171 m AV Peak Grad:      6.8 mmHg AV Mean Grad:      3.7 mmHg LVOT Vmax:         64.70 cm/s LVOT Vmean:        41.300 cm/s LVOT VTI:          0.088 m LVOT/AV VTI ratio: 0.52  AORTA Ao Root diam: 2.90 cm MITRAL VALVE               TRICUSPID VALVE MV Area (PHT): 4.83 cm    TR Peak grad:   30.0 mmHg MV Decel Time: 157 msec    TR Vmax:        274.00 cm/s MV E velocity: 99.60 cm/s  SHUNTS                            Systemic VTI:  0.09 m                            Systemic Diam: 2.00 cm Yolonda Kida MD Electronically signed by Yolonda Kida MD Signature Date/Time: 09/02/2022/6:31:53 PM    Final              LOS: 2 days    Time spent: [place], DO [facility] 09/04/2022, 5:00 PM    Dictation software  may have been used to generate the above note. Typos may occur and escape review in typed/dictated notes. Please contact Dr Sheppard Coil directly for clarity if needed.  Staff may message me via secure chat in [place]  but this may not receive an immediate response,  please page me for urgent matters!  If 7PM-7AM, please contact night coverage [email or web address]

## 2022-09-04 NOTE — Progress Notes (Signed)
HR 45, Dr. Sheppard Coil made aware.  Pt is asymptomatic, no complaints offered at this time.  Pt had HR in the 40s in the last 48 hours.  Daughter at bedside.  Pt needs addressed, will monitor.

## 2022-09-04 NOTE — TOC Progression Note (Signed)
Transition of Care [facility]) - Progression Note    Patient Details  Name: Rebecca Wolf MRN: [id number] Date of Birth: [birth year]  Transition of Care [facility]) CM/SW [place], RN Phone Number: 09/04/2022, 4:12 PM  Clinical Narrative:     Spoke with the daughter [name], she called [facility] and the admissions coordinator is of this afternoon, they will call her back tomorrow and see if they can offer a bed  Expected Discharge Plan: [place] Barriers to Discharge: Continued Medical Work up  Expected Discharge Plan and [place] arrangements for the past 2 months: Single Family Home                                       Social Determinants of Health (SDOH) Interventions SDOH Screenings   Food Insecurity: No Food Insecurity (08/30/2022)  Housing: Low Risk  (08/30/2022)  Transportation Needs: No Transportation Needs (08/30/2022)  Utilities: Not At Risk (08/30/2022)  Tobacco Use: Medium Risk (09/02/2022)    Readmission Risk Interventions     No data to display

## 2022-09-05 LAB — COMPREHENSIVE METABOLIC PANEL
ALT: 16 U/L (ref 0–44)
AST: 25 U/L (ref 15–41)
Albumin: 2.8 g/dL — ABNORMAL LOW (ref 3.5–5.0)
Alkaline Phosphatase: 117 U/L (ref 38–126)
Anion gap: 9 (ref 5–15)
BUN: 19 mg/dL (ref 8–23)
CO2: 25 mmol/L (ref 22–32)
Calcium: 8.4 mg/dL — ABNORMAL LOW (ref 8.9–10.3)
Chloride: 104 mmol/L (ref 98–111)
Creatinine, Ser: 1.12 mg/dL — ABNORMAL HIGH (ref 0.44–1.00)
GFR, Estimated: 45 mL/min — ABNORMAL LOW (ref >60)
Glucose, Bld: 110 mg/dL — ABNORMAL HIGH (ref 70–99)
Potassium: 3.7 mmol/L (ref 3.5–5.1)
Sodium: 138 mmol/L (ref 135–145)
Total Bilirubin: 0.5 mg/dL (ref 0.3–1.2)
Total Protein: 6.2 g/dL — ABNORMAL LOW (ref 6.5–8.1)

## 2022-09-05 LAB — GLUCOSE, CAPILLARY
Glucose-Capillary: 109 mg/dL — ABNORMAL HIGH (ref 70–99)
Glucose-Capillary: 128 mg/dL — ABNORMAL HIGH (ref 70–99)
Glucose-Capillary: 107 mg/dL — ABNORMAL HIGH (ref 70–99)
Glucose-Capillary: 117 mg/dL — ABNORMAL HIGH (ref 70–99)

## 2022-09-05 LAB — MAGNESIUM: Magnesium: 2.2 mg/dL (ref 1.7–2.4)

## 2022-09-05 MED ORDER — LIDOCAINE 5 % EX PTCH
1.0000 | MEDICATED_PATCH | CUTANEOUS | Status: DC
  Filled 2022-09-05: qty 1

## 2022-09-05 NOTE — Progress Notes (Signed)
Physical Therapy Treatment Patient Details Name: Rebecca Wolf MRN: [id number] DOB: [birth year] Today's Date: 09/05/2022   History of Present Illness Pt is a 89 y/o female admitted secondary to SOB and found to have a CHF exacerbation. IV lasix was initiated. PMH including but not limited to breast CA, HTN and seizures.    PT Comments    Patient pleasant this session. Agrees to PT/OT. [facility]. Patient requires min A for bed mobility to bring LEs back onto bed. She is min A for sit to stand and Min guard for ambulation of ~30 feet. Patient will continue to benefit from skilled PT to improve strength and functional independence for improved safety.    Recommendations for follow up therapy are one component of a multi-disciplinary discharge planning process, led by the attending physician.  Recommendations may be updated based on patient status, additional functional criteria and insurance authorization.  Follow Up Recommendations  Skilled nursing-short term rehab (<3 hours/day) Can patient physically be transported by private vehicle: Yes   Assistance Recommended at Discharge Frequent or constant Supervision/Assistance  Patient can return home with the following A little help with walking and/or transfers;A lot of help with bathing/dressing/bathroom;Help with stairs or ramp for entrance;Assist for transportation   Equipment Recommendations  None recommended by PT (TBD)    Recommendations for Other Services       Precautions / Restrictions Precautions Precautions: Fall Restrictions Weight Bearing Restrictions: No     Mobility  Bed Mobility Overal bed mobility: Needs Assistance Bed Mobility: Supine to Sit     Supine to sit: Modified independent (Device/Increase time) Sit to supine: Min assist   General bed mobility comments: Min A to bring LEs back up onto bed.    Transfers Overall transfer level: Needs assistance Equipment used: Rolling walker  (2 wheels) Transfers: Sit to/from Stand Sit to Stand: Min assist                Ambulation/Gait Ambulation/Gait assistance: Min guard Gait Distance (Feet): 30 Feet Assistive device: Rolling walker (2 wheels) Gait Pattern/deviations: Step-through pattern, Decreased step length - right, Decreased step length - left, Decreased stride length Gait velocity: decr     General Gait Details: patient ambulated with min guard and RW out to hallway and turned around. Reports SOB, but O2 sats at 99-100%   Stairs             Wheelchair Mobility    Modified Rankin (Stroke Patients Only)       Balance Overall balance assessment: Needs assistance Sitting-balance support: Feet supported Sitting balance-Leahy Scale: Good     Standing balance support: Bilateral upper extremity supported, During functional activity, Reliant on assistive device for balance Standing balance-Leahy Scale: Fair                              Cognition Arousal/Alertness: Awake/alert Behavior During Therapy: WFL for tasks assessed/performed Overall Cognitive Status: Within Functional Limits for tasks assessed                                          Exercises      General Comments        Pertinent Vitals/Pain Pain Assessment Pain Assessment: No/denies pain    Home Living  Prior Function            PT Goals (current goals can now be found in the care plan section) Acute Rehab PT Goals Patient Stated Goal: pt wants to return home ASAP, daughter wants her to go to STR to get stronger PT Goal Formulation: With patient/family Time For Goal Achievement: 09/15/22 Potential to Achieve Goals: Good Progress towards PT goals: Progressing toward goals    Frequency    Min 2X/week      PT Plan Current plan remains appropriate    Co-evaluation PT/OT/SLP Co-Evaluation/Treatment: Yes Reason for Co-Treatment: Necessary to  address cognition/behavior during functional activity PT goals addressed during session: Mobility/safety with mobility;Balance;Proper use of DME        AM-PAC PT "6 Clicks" Mobility   Outcome Measure  Help needed turning from your back to your side while in a flat bed without using bedrails?: A Little Help needed moving from lying on your back to sitting on the side of a flat bed without using bedrails?: A Little Help needed moving to and from a bed to a chair (including a wheelchair)?: A Little Help needed standing up from a chair using your arms (e.g., wheelchair or bedside chair)?: A Little Help needed to walk in hospital room?: A Little Help needed climbing 3-5 steps with a railing? : A Lot 6 Click Score: 17    End of Session Equipment Utilized During Treatment: Gait belt Activity Tolerance: Patient tolerated treatment well Patient left: in bed;with call bell/phone within reach;with bed alarm set;with family/visitor present Nurse Communication: Mobility status PT Visit Diagnosis: Difficulty in walking, not elsewhere classified (R26.2)     Time: [id number] PT Time Calculation (min) (ACUTE ONLY): 23 min  Charges:  $Gait Training: 8-22 mins                     Shellee Streng, PT, GCS 09/05/22,11:23 AM

## 2022-09-05 NOTE — TOC Progression Note (Signed)
Transition of Care [facility]) - Progression Note    Patient Details  Name: Rebecca Wolf MRN: [id number] Date of Birth: 10/10/1924  Transition of Care [facility]) CM/SW [place], RN Phone Number: 09/05/2022, 9:53 AM  Clinical Narrative:    [name] with [name] the patient's daughter and inquired about the plan for the patient, I explained the difference between STR and LTC and she stated that the patient will need STR, I explained she will need to work with Therapy to get Ins approval, I reached out to [place] at [facility] and explained the plan as well, Waiting to see if [facility] will offer a bed   Expected Discharge Plan: [place] Barriers to Discharge: Continued Medical Work up  Expected Discharge Plan and Services       Living arrangements for the past 2 months: Single Family Home                                       Social Determinants of Health (SDOH) Interventions SDOH Screenings   Food Insecurity: No Food Insecurity (08/30/2022)  Housing: Low Risk  (08/30/2022)  Transportation Needs: No Transportation Needs (08/30/2022)  Utilities: Not At Risk (08/30/2022)  Tobacco Use: Medium Risk (09/02/2022)    Readmission Risk Interventions     No data to display

## 2022-09-05 NOTE — Progress Notes (Signed)
PROGRESS NOTE    Rebecca Wolf   [id number] DOB: 06/27/1925  DOA: 08/30/2022 Date of Service: 09/05/22 PCP: Erline Levine, MD     Brief Narrative / Hospital Course:  89 years old female with medical history of hypertension, seizure disorder, CHF with EF of 20% and GERD Presented to the ED 08/30/2022 with complaint of shortness of breath.  Patient reports shortness of breath lasted about 30 minutes and then resolved.  Patient was asymptomatic on presentation in the ED.  As per daughter patient momentarily gets short of breath when she has to use the restroom  03/01: admitted to hospitalist service for CHF 03/02: bradycardic on amiodarone 200 mg daily.    03/03: patient had an episode of seizure.  This was witnessed by patient's daughter in the room.  According to patient's daughter, neurologist was weaning patient off  Keppra.  Patient seen by neurologist who recommended going back on patient's previous Keppra dose of 750 twice daily.  03/04: stable, PT/OT recs for SNF  03/05: plan for SNF, auth pending, TOC working w/ family on placement, see notes. Cardiology team has cleared for discharge, to follow in office w/ Dr Clayborn Bigness in 1-2 weeks.  03/06-03/07: SNF placement pending auth  Consultants:  Cardiology  Neurology   Procedures: none  Pertinent results:  Most recent 2D Echo shows EF of 15% at [place] C/W reduced ejection fraction heart failure     ASSESSMENT & PLAN:   Principal Problem:   SOB (shortness of breath) Active Problems:   Chronic systolic CHF (congestive heart failure) (HCC)   Seizure disorder (HCC)   Pulmonary embolism (HCC)   CKD stage G3b/A1, GFR 30-44 and albumin creatinine ratio <30 mg/g (HCC)   Diabetes mellitus with no complication (HCC)   Anemia   Electrolyte abnormality   Acute on chronic HFrEF (heart failure with reduced ejection fraction) (HCC)   Malnutrition of moderate degree  Acute on chronic HFrEF Moderate to severe  MR Shortness of breath d/t above  Clinical improvement following IV diuresis with IV Lasix twice daily.  Was previously taking 20 mg of p.o. Lasix daily at home restart 40 mg p.o. Lasix once daily 09/04/22  GDMT with losartan 25 mg daily and spironolactone 25 mg once daily no BB with baseline bradycardia Consider SGLT2i as renal function allows No further cardiac diagnostics necessary ok for discharge from a cardiac perspective per note 03/05  Community-acquired pneumonia, present on admission SOB d/t above Chest x-ray obtained showed new infiltrates on the right side Continue ceftriaxone and azithromycin while inpatient and transition on po abx on discharge if still needing more days to complete 5-7 days total  Chronic atrial flutter Rate controlled on telemetry CHA2DS2-VASc 7 (age, sex, hx VTE, CHF, HTN) Continue amiodarone 100 mg once daily  Eliquis 2.5 mg twice daily. Dose reduced per hematology (hx GIB).     Seizure disorder Acute seizure event 09/01/2022 At home patient takes Keppra 375 mg bid, weaning down IV Keppra has been switched to oral Keppra 750 twice daily  Patient will be discharged on 750 mg of Keppra twice daily Seizure precaution.  Neurologist on board - appreciate input  Hypomagnesemia Hypokalemia Now resolved  continue to monitor levels  Normocytic anemia No indication for transfusion at this time Continue to monitor CBC   Diabetes mellitus with no complications Continue to monitor glucose level Continue current insulin regimen   Hx Pulmonary embolism  Daughter said pt has h/o VTE and is on eliquis.  CTA neg  for PE Continue Eliquis   DVT prophylaxis: Eliquis Pertinent IV fluids/nutrition: no conitnuous IV fluids Central lines / invasive devices: none  Code Status: FULL CODE   Current Admission Status: inpatietn  TOC needs / Dispo plan: SNF Barriers to discharge / significant pending items: placement              Subjective /  Brief ROS:  Patient reports no concerns other than wants to go home  Denies CP/SOB. Participated w/ PT today    Family Communication: daughter at bedside on rounds     Objective Findings:  Vitals:   09/05/22 0711 09/05/22 0806 09/05/22 0904 09/05/22 1620  BP:   94/70 101/65  Pulse:   92 93  Resp:   16 19  Temp:   (!) 97.5 F (36.4 C) 98.2 F (36.8 C)  TempSrc:      SpO2:  97% 95% 96%  Weight: 68.9 kg     Height:        Intake/Output Summary (Last 24 hours) at 09/05/2022 1721 Last data filed at 09/05/2022 1248 Gross per 24 hour  Intake 660 ml  Output --  Net 660 ml   Filed Weights   09/03/22 0500 09/04/22 0705 09/05/22 0711  Weight: 71.8 kg 68.7 kg 68.9 kg    Examination:  Physical Exam Constitutional:      General: She is not in acute distress. Cardiovascular:     Rate and Rhythm: Normal rate and regular rhythm.  Pulmonary:     Effort: Pulmonary effort is normal.     Breath sounds: Examination of the right-lower field reveals decreased breath sounds. Examination of the left-lower field reveals decreased breath sounds. Decreased breath sounds present.  Skin:    General: Skin is warm and dry.  Neurological:     General: No focal deficit present.     Mental Status: She is alert. She is disoriented.  Psychiatric:        Mood and Affect: Mood normal.        Behavior: Behavior normal.          Scheduled Medications:   amiodarone  100 mg Oral Daily   apixaban  2.5 mg Oral BID   ascorbic acid  500 mg Oral BID   azithromycin  500 mg Oral Daily   budesonide  0.5 mg Inhalation BID   feeding supplement (GLUCERNA SHAKE)  237 mL Oral TID BM   fluticasone  2 spray Each Nare Daily   furosemide  20 mg Oral Daily   insulin aspart  0-5 Units Subcutaneous QHS   insulin aspart  0-9 Units Subcutaneous TID WC   levETIRAcetam  500 mg Oral BID   losartan  25 mg Oral Daily   montelukast  10 mg Oral QHS   multivitamin with minerals  1 tablet Oral Daily   pantoprazole  40  mg Oral Daily   sodium chloride flush  3 mL Intravenous Q12H   spironolactone  12.5 mg Oral Daily   zinc sulfate  220 mg Oral Daily    Continuous Infusions:  cefTRIAXone (ROCEPHIN)  IV Stopped (09/04/22 1823)    PRN Medications:  acetaminophen **OR** acetaminophen, bisacodyl, hydrALAZINE, polyethylene glycol  Antimicrobials from admission:  Anti-infectives (From admission, onward)    Start     Dose/Rate Route Frequency Ordered Stop   09/03/22 1800  cefTRIAXone (ROCEPHIN) 1 g in sodium chloride 0.9 % 100 mL IVPB        1 g 200 mL/hr over 30 Minutes Intravenous  Every 24 hours 09/03/22 1700     09/03/22 1800  azithromycin (ZITHROMAX) tablet 500 mg        500 mg Oral Daily 09/03/22 1700 09/06/22 1759           Data Reviewed:  I have personally reviewed the following...  CBC: Recent Labs  Lab 08/30/22 1501 08/31/22 0437 09/03/22 1002 09/04/22 0510  WBC 5.3 6.0 6.4 6.8  NEUTROABS  --   --   --  3.7  HGB 10.8* 9.7* 10.1* 10.8*  HCT 35.7* 31.5* 32.5* 34.8*  MCV 76.9* 75.0* 74.2* 74.8*  PLT 248 227 266 [id number]   Basic Metabolic Panel: Recent Labs  Lab 09/01/22 0434 09/01/22 1856 09/02/22 0606 09/03/22 0632 09/04/22 0510 09/05/22 0350  NA 139  --  139 135 140 138  K 3.4*  --  4.3 4.1 3.7 3.7  CL 105  --  104 103 104 104  CO2 25  --  '27 26 26 25  '$ GLUCOSE 106*  --  108* 107* 107* 110*  BUN 15  --  '14 18 19 19  '$ CREATININE 0.98  --  0.96 1.09* 1.27* 1.12*  CALCIUM 8.1*  --  8.4* 8.3* 8.4* 8.4*  MG 1.3* 3.2* 2.4 2.0 1.9 2.2   GFR: Estimated Creatinine Clearance: 28 mL/min (A) (by C-G formula based on SCr of 1.12 mg/dL (H)). Liver Function Tests: Recent Labs  Lab 08/31/22 0437 09/02/22 0606 09/03/22 0632 09/04/22 0510 09/05/22 0350  AST '24 24 21 23 25  '$ ALT '14 14 13 15 16  '$ ALKPHOS 111 109 103 115 117  BILITOT 0.5 0.3 0.7 0.6 0.5  PROT 5.9* 6.2* 5.7* 6.1* 6.2*  ALBUMIN 2.4* 2.7* 2.5* 2.7* 2.8*   No results for input(s): "LIPASE", "AMYLASE" in the last 168  hours. No results for input(s): "AMMONIA" in the last 168 hours. Coagulation Profile: No results for input(s): "INR", "PROTIME" in the last 168 hours. Cardiac Enzymes: No results for input(s): "CKTOTAL", "CKMB", "CKMBINDEX", "TROPONINI" in the last 168 hours. BNP (last 3 results) No results for input(s): "PROBNP" in the last 8760 hours. HbA1C: No results for input(s): "HGBA1C" in the last 72 hours. CBG: Recent Labs  Lab 09/04/22 1643 09/04/22 2243 09/05/22 0814 09/05/22 1155 09/05/22 1609  GLUCAP 135* 152* 109* 128* 107*   Lipid Profile: No results for input(s): "CHOL", "HDL", "LDLCALC", "TRIG", "CHOLHDL", "LDLDIRECT" in the last 72 hours. Thyroid Function Tests: No results for input(s): "TSH", "T4TOTAL", "FREET4", "T3FREE", "THYROIDAB" in the last 72 hours. Anemia Panel: No results for input(s): "VITAMINB12", "FOLATE", "FERRITIN", "TIBC", "IRON", "RETICCTPCT" in the last 72 hours. Most Recent Urinalysis On File:     Component Value Date/Time   COLORURINE YELLOW (A) 08/30/2022 1826   APPEARANCEUR TURBID (A) 08/30/2022 1826   APPEARANCEUR HAZY 04/19/2013 1133   LABSPEC 1.034 (H) 08/30/2022 1826   LABSPEC 1.015 04/19/2013 1133   PHURINE 5.0 08/30/2022 1826   GLUCOSEU NEGATIVE 08/30/2022 1826   GLUCOSEU NEGATIVE 04/19/2013 1133   HGBUR NEGATIVE 08/30/2022 1826   BILIRUBINUR NEGATIVE 08/30/2022 1826   BILIRUBINUR meg 02/13/2017 1155   BILIRUBINUR NEGATIVE 04/19/2013 1133   KETONESUR NEGATIVE 08/30/2022 1826   PROTEINUR NEGATIVE 08/30/2022 1826   UROBILINOGEN 0.2 02/13/2017 1155   NITRITE NEGATIVE 08/30/2022 1826   LEUKOCYTESUR NEGATIVE 08/30/2022 1826   LEUKOCYTESUR 2+ 04/19/2013 1133   Sepsis Labs: '@LABRCNTIP'$ (procalcitonin:4,lacticidven:4) Microbiology: No results found for this or any previous visit (from the past 240 hour(s)).    Radiology Studies last 3 days: [facility] Chest [facility]  1 View  Result Date: 09/03/2022 CLINICAL DATA:  Cough EXAM: PORTABLE CHEST 1 VIEW  COMPARISON:  CXR 08/30/22 FINDINGS: Unchanged small left-sided pleural effusion. No right-sided pleural effusion. No pneumothorax. Compared to prior exam there are new patchy bilateral airspace opacity, particularly in the right upper lung. No radiographically apparent displaced rib fractures. Visualized upper abdomen is unremarkable. IMPRESSION: 1. New patchy bilateral airspace opacity, particularly in the right upper lung, concerning for multifocal pneumonia. 2. Unchanged small left-sided pleural effusion. Electronically Signed   By: Marin Roberts M.D.   On: 09/03/2022 09:25   ECHOCARDIOGRAM COMPLETE  Result Date: 09/02/2022    ECHOCARDIOGRAM REPORT   Patient Name:   DREYAH MINKLER Date of Exam: 09/02/2022 Medical Rec #:  [id number]     Height:       64.0 in Accession #:    [id number]    Weight:       157.8 lb Date of Birth:  [birth year]      BSA:          1.769 m Patient Age:    14 years      BP:           101/64 mmHg Patient Gender: F             HR:           47 bpm. Exam Location:  [facility] Procedure: 2D Echo, Cardiac Doppler and Color Doppler Indications:     CHF I50.9  History:         Patient has no prior history of Echocardiogram examinations.                  Risk Factors:Hypertension.  Sonographer:     Sherrie Sport Referring Phys:  [place] Diagnosing Phys: Yolonda Kida MD IMPRESSIONS  1. Severely depressed LVF Globally.  2. Left ventricular ejection fraction, by estimation, is 20 to 25%. The left ventricle has severely decreased function. The left ventricle demonstrates global hypokinesis. The left ventricular internal cavity size was moderately dilated. There is moderate concentric left ventricular hypertrophy. Left ventricular diastolic parameters are consistent with Grade I diastolic dysfunction (impaired relaxation).  3. Right ventricular systolic function is moderately reduced. The right ventricular size is moderately enlarged. Mildly increased right ventricular wall thickness.  4. Left atrial  size was severely dilated.  5. Right atrial size was moderately dilated.  6. The mitral valve is grossly normal. Moderate to severe mitral valve regurgitation.  7. Tricuspid valve regurgitation is mild to moderate.  8. The aortic valve is calcified. Aortic valve regurgitation is trivial. Aortic valve sclerosis/calcification is present, without any evidence of aortic stenosis. FINDINGS  Left Ventricle: Left ventricular ejection fraction, by estimation, is 20 to 25%. The left ventricle has severely decreased function. The left ventricle demonstrates global hypokinesis. The left ventricular internal cavity size was moderately dilated. There is moderate concentric left ventricular hypertrophy. Abnormal (paradoxical) septal motion, consistent with left bundle branch block. Left ventricular diastolic parameters are consistent with Grade I diastolic dysfunction (impaired relaxation). Right Ventricle: The right ventricular size is moderately enlarged. Mildly increased right ventricular wall thickness. Right ventricular systolic function is moderately reduced. Left Atrium: Left atrial size was severely dilated. Right Atrium: Right atrial size was moderately dilated. Pericardium: There is no evidence of pericardial effusion. Mitral Valve: The mitral valve is grossly normal. Moderate to severe mitral valve regurgitation. Tricuspid Valve: The tricuspid valve is grossly normal. Tricuspid valve regurgitation is mild to moderate. Aortic Valve:  The aortic valve is calcified. Aortic valve regurgitation is trivial. Aortic valve sclerosis/calcification is present, without any evidence of aortic stenosis. Aortic valve mean gradient measures 3.7 mmHg. Aortic valve peak gradient measures 6.8 mmHg. Aortic valve area, by VTI measures 1.62 cm. Pulmonic Valve: The pulmonic valve was grossly normal. Pulmonic valve regurgitation is trivial. Aorta: The aortic root, ascending aorta and aortic arch are all structurally normal, with no evidence  of dilitation or obstruction and the ascending aorta was not well visualized. IAS/Shunts: No atrial level shunt detected by color flow Doppler. Additional Comments: Severely depressed LVF Globally.  LEFT VENTRICLE PLAX 2D LVIDd:         4.80 cm      Diastology LVIDs:         4.20 cm      LV e' lateral:   16.80 cm/s LV PW:         1.00 cm      LV E/e' lateral: 5.9 LV IVS:        1.40 cm LVOT diam:     2.00 cm LV SV:         28 LV SV Index:   16 LVOT Area:     3.14 cm  LV Volumes (MOD) LV vol d, MOD A2C: 108.0 ml LV vol d, MOD A4C: 93.4 ml LV vol s, MOD A2C: 53.1 ml LV vol s, MOD A4C: 86.0 ml LV SV MOD A2C:     54.9 ml LV SV MOD A4C:     93.4 ml LV SV MOD BP:      36.0 ml RIGHT VENTRICLE RV Basal diam:  3.60 cm RV Mid diam:    3.10 cm RV S prime:     10.90 cm/s TAPSE (M-mode): 1.6 cm LEFT ATRIUM             Index        RIGHT ATRIUM           Index LA diam:        3.70 cm 2.09 cm/m   RA Area:     11.30 cm LA Vol (A2C):   52.5 ml 29.68 ml/m  RA Volume:   21.00 ml  11.87 ml/m LA Vol (A4C):   51.1 ml 28.88 ml/m LA Biplane Vol: 56.1 ml 31.71 ml/m  AORTIC VALVE AV Area (Vmax):    1.56 cm AV Area (Vmean):   1.50 cm AV Area (VTI):     1.62 cm AV Vmax:           130.00 cm/s AV Vmean:          86.700 cm/s AV VTI:            0.171 m AV Peak Grad:      6.8 mmHg AV Mean Grad:      3.7 mmHg LVOT Vmax:         64.70 cm/s LVOT Vmean:        41.300 cm/s LVOT VTI:          0.088 m LVOT/AV VTI ratio: 0.52  AORTA Ao Root diam: 2.90 cm MITRAL VALVE               TRICUSPID VALVE MV Area (PHT): 4.83 cm    TR Peak grad:   30.0 mmHg MV Decel Time: 157 msec    TR Vmax:        274.00 cm/s MV E velocity: 99.60 cm/s  SHUNTS                            Systemic VTI:  0.09 m                            Systemic Diam: 2.00 cm Yolonda Kida MD Electronically signed by Yolonda Kida MD Signature Date/Time: 09/02/2022/6:31:53 PM    Final              LOS: 3 days    Time spent: [place], DO [facility] 09/05/2022, 5:21 PM    Dictation software may have been used to generate the above note. Typos may occur and escape review in typed/dictated notes. Please contact Dr Sheppard Coil directly for clarity if needed.  Staff may message me via secure chat in [place]  but this may not receive an immediate response,  please page me for urgent matters!  If 7PM-7AM, please contact night coverage [email or web address]

## 2022-09-05 NOTE — Care Management Important Message (Signed)
Important Message  Patient Details  Name: Rebecca Wolf MRN: [id number] Date of Birth: 1925-04-28   Medicare Important Message Given:  Yes     Dannette Barbara 09/05/2022, 2:24 PM

## 2022-09-05 NOTE — TOC Progression Note (Signed)
Transition of Care [facility]) - Progression Note    Patient Details  Name: Rebecca Wolf MRN: [id number] Date of Birth: 1925/02/08  [facility] CM/SW [place], RN Phone Number: 09/05/2022, 3:51 PM  Clinical Narrative:    Spoke with the daughter Deborah Chalk explained [facility] made the bed offer and that Ins is pending to go to [facility], I anticipate DC tomorrow, she stated that the patient's son will transport   Expected Discharge Plan: [place] Barriers to Discharge: Continued Medical Work up  Expected Discharge Plan and [place] arrangements for the past 2 months: Single Family Home                                       Social Determinants of Health (SDOH) Interventions SDOH Screenings   Food Insecurity: No Food Insecurity (08/30/2022)  Housing: Low Risk  (08/30/2022)  Transportation Needs: No Transportation Needs (08/30/2022)  Utilities: Not At Risk (08/30/2022)  Tobacco Use: Medium Risk (09/02/2022)    Readmission Risk Interventions     No data to display

## 2022-09-05 NOTE — Progress Notes (Signed)
Nutrition Follow-up  DOCUMENTATION CODES:   Non-severe (moderate) malnutrition in context of chronic illness  INTERVENTION:   -Continue 500 mg vitamin C BID -Continue 220 mg zinc sulfate daily x 14 days -Continue Glucerna Shake po TID, each supplement provides 220 kcal and 10 grams of protein  -Continue with liberalized diet of regular  NUTRITION DIAGNOSIS:   Moderate Malnutrition related to chronic illness (CHF) as evidenced by mild fat depletion, moderate fat depletion, mild muscle depletion, moderate muscle depletion, edema.  Ongoing  GOAL:   Patient will meet greater than or equal to 90% of their needs  Progressing   MONITOR:   PO intake, Supplement acceptance  REASON FOR ASSESSMENT:   Consult Assessment of nutrition requirement/status  ASSESSMENT:   89 y.o. female admits related to SOB. PMH includes: asthma, breast cancer, GERD, hemorrhoids, HTN.  Reviewed I/O's: +720 ml x 24 hours and -1.1 L since admission   Pt with improved oral intake. Noted meal completions 50-100%. Pt is consuming Glucerna supplements well.   Wt has been stable since admission.   Per TOC notes, plan for SNF at discharge.   Medications reviewed and include lasix, vitamin C, keppra,aldactone, and zinc sulfate.   Labs reviewed: CBGS: 109-152 (inpatient orders for glycemic control are 0-5 units insulin aspart daily at bedside, 0-9 units insulin aspart TID with meals, ).    Diet Order:   Diet Order             Diet regular Room service appropriate? Yes; Fluid consistency: Thin  Diet effective now                   EDUCATION NEEDS:   Education needs have been addressed  Skin:  Skin Assessment: Skin Integrity Issues: Skin Integrity Issues:: Stage II Stage II: sacrum  Last BM:  09/02/22 (type 6)  Height:   Ht Readings from Last 1 Encounters:  08/30/22 '5\' 4"'$  (1.626 m)    Weight:   Wt Readings from Last 1 Encounters:  09/05/22 68.9 kg    Ideal Body Weight:  54.5  kg  BMI:  Body mass index is 26.07 kg/m.  Estimated Nutritional Needs:   Kcal:  1700-1900  Protein:  90-105 grams  Fluid:  > 1.7 L    Loistine Chance, RD, LDN, [place] Registered Dietitian II Certified Diabetes Care and Education Specialist Please refer to [facility] for RD and/or RD on-call/weekend/after hours pager

## 2022-09-05 NOTE — Progress Notes (Signed)
Occupational Therapy Treatment Patient Details Name: Rebecca Wolf MRN: [id number] DOB: [birth year] Today's Date: 09/05/2022   History of present illness Pt is a 89 y/o female admitted secondary to SOB and found to have a CHF exacerbation. IV lasix was initiated. PMH including but not limited to breast CA, HTN and seizures.   OT comments  Upon entering the room, pt having just returned to bed with RN from [facility]. Pt's family member present in room and seems to be a great motivator for pt. Pt is agreeable to OOB activity this session. She does display decreased awareness to deficits and needing cuing for safety awareness due to impulsivity during session. Pt's largest barrier to discharge home is pt's level of fatigue. Pt able to perform ambulation and functional transfer with min A. Pt likely needing increased assistance for LB self care needs. Pt ambulates ~ 15' in room before turning around to return to bed. Pt would benefit from short term rehab to address functional deficits before returning home.    Recommendations for follow up therapy are one component of a multi-disciplinary discharge planning process, led by the attending physician.  Recommendations may be updated based on patient status, additional functional criteria and insurance authorization.    Follow Up Recommendations  Skilled nursing-short term rehab (<3 hours/day)     Assistance Recommended at Discharge Frequent or constant Supervision/Assistance  Patient can return home with the following  A lot of help with bathing/dressing/bathroom;Assistance with cooking/housework;Assist for transportation;Help with stairs or ramp for entrance   Equipment Recommendations  Other (comment) (defer to next venue of care)       Precautions / Restrictions Precautions Precautions: Fall Restrictions Weight Bearing Restrictions: No       Mobility Bed Mobility Overal bed mobility: Needs Assistance Bed Mobility: Supine to Sit, Sit to Supine      Supine to sit: Supervision Sit to supine: Min assist   General bed mobility comments: cuing for safety awareness    Transfers Overall transfer level: Needs assistance Equipment used: Rolling walker (2 wheels) Transfers: Sit to/from Stand Sit to Stand: Min assist                 Balance Overall balance assessment: Needs assistance Sitting-balance support: Feet supported Sitting balance-Leahy Scale: Good     Standing balance support: Bilateral upper extremity supported, During functional activity, Reliant on assistive device for balance Standing balance-Leahy Scale: Fair                             ADL either performed or assessed with clinical judgement   ADL Overall ADL's : Needs assistance/impaired     Grooming: Wash/dry hands;Wash/dry face;Standing;Minimal assistance;Min guard                                      Extremity/Trunk Assessment Upper Extremity Assessment Upper Extremity Assessment: Generalized weakness   Lower Extremity Assessment Lower Extremity Assessment: Generalized weakness        Vision Patient Visual Report: No change from baseline            Cognition Arousal/Alertness: Awake/alert Behavior During Therapy: Impulsive Overall Cognitive Status: History of cognitive impairments - at baseline  General Comments: cuing for safety awareness                   Pertinent Vitals/ Pain       Pain Assessment Pain Assessment: No/denies pain         Frequency  Min 2X/week        Progress Toward Goals  OT Goals(current goals can now be found in the care plan section)  Progress towards OT goals: Progressing toward goals     Plan Discharge plan remains appropriate;Frequency remains appropriate    Co-evaluation      Reason for Co-Treatment: Necessary to address cognition/behavior during functional activity;For patient/therapist safety;To address  functional/ADL transfers PT goals addressed during session: Mobility/safety with mobility;Balance;Proper use of DME OT goals addressed during session: ADL's and self-care      AM-PAC OT "6 Clicks" Daily Activity     Outcome Measure   Help from another person eating meals?: A Little Help from another person taking care of personal grooming?: A Little Help from another person toileting, which includes using toliet, bedpan, or urinal?: A Lot Help from another person bathing (including washing, rinsing, drying)?: A Lot Help from another person to put on and taking off regular upper body clothing?: A Little Help from another person to put on and taking off regular lower body clothing?: A Lot 6 Click Score: 15    End of Session Equipment Utilized During Treatment: Gait belt;Rolling walker (2 wheels)  OT Visit Diagnosis: Other abnormalities of gait and mobility (R26.89);Muscle weakness (generalized) (M62.81)   Activity Tolerance Patient tolerated treatment well   Patient Left in bed;with call bell/phone within reach;with bed alarm set;with family/visitor present   Nurse Communication Mobility status        Time: [id number] OT Time Calculation (min): 23 min  Charges: OT General Charges $OT Visit: 1 Visit OT Treatments $Therapeutic Activity: 8-22 mins  Darleen Crocker, MS, OTR/L , CBIS ascom [phone]  09/05/22, 12:58 PM

## 2022-09-05 NOTE — TOC Progression Note (Signed)
Transition of Care [facility]) - Progression Note    Patient Details  Name: [name] MRN: [id number] Date of Birth: 1925-04-15  Transition of Care [facility]) CM/SW [place], RN Phone Number: 09/05/2022, 3:52 PM  Clinical Narrative:    [facility] authorization submitted.  SNF authorization pending at this time.  SNF auth Pending ID is [id number]. TOC will continue to follow for discharge planning.    Expected Discharge Plan: [place] Barriers to Discharge: Continued Medical Work up  Expected Discharge Plan and Services       Living arrangements for the past 2 months: Single Family Home                                       Social Determinants of Health (SDOH) Interventions SDOH Screenings   Food Insecurity: No Food Insecurity (08/30/2022)  Housing: Low Risk  (08/30/2022)  Transportation Needs: No Transportation Needs (08/30/2022)  Utilities: Not At Risk (08/30/2022)  Tobacco Use: Medium Risk (09/02/2022)    Readmission Risk Interventions     No data to display

## 2022-09-06 LAB — GLUCOSE, CAPILLARY
Glucose-Capillary: 103 mg/dL — ABNORMAL HIGH (ref 70–99)
Glucose-Capillary: 172 mg/dL — ABNORMAL HIGH (ref 70–99)

## 2022-09-06 MED ORDER — BISACODYL 5 MG PO TBEC: 5.0000 mg | DELAYED_RELEASE_TABLET | Freq: Every day | ORAL | 0 refills | Status: AC | PRN

## 2022-09-06 MED ORDER — SPIRONOLACTONE 25 MG PO TABS: 12.5000 mg | ORAL_TABLET | Freq: Every day | ORAL | 0 refills | Status: AC

## 2022-09-06 MED ORDER — AZITHROMYCIN 500 MG PO TABS: 500.0000 mg | ORAL_TABLET | Freq: Every day | ORAL | 0 refills | Status: AC

## 2022-09-06 MED ORDER — PREDNISONE 5 MG PO TABS: 5.0000 mg | ORAL_TABLET | Freq: Every day | ORAL | 0 refills | Status: AC

## 2022-09-06 MED ORDER — POLYETHYLENE GLYCOL 3350 17 G PO PACK: 17.0000 g | PACK | Freq: Every day | ORAL | 0 refills | Status: AC | PRN

## 2022-09-06 MED ORDER — FUROSEMIDE 20 MG PO TABS: 20.0000 mg | ORAL_TABLET | Freq: Every day | ORAL | 0 refills | Status: AC

## 2022-09-06 MED ORDER — AMOXICILLIN-POT CLAVULANATE 500-125 MG PO TABS: 1.0000 | ORAL_TABLET | Freq: Two times a day (BID) | ORAL | 0 refills | Status: AC

## 2022-09-06 MED ORDER — AMIODARONE HCL 100 MG PO TABS: 100.0000 mg | ORAL_TABLET | Freq: Every day | ORAL | 0 refills | Status: AC

## 2022-09-06 MED ORDER — LOSARTAN POTASSIUM 25 MG PO TABS: 25.0000 mg | ORAL_TABLET | Freq: Every day | ORAL | 0 refills | Status: AC

## 2022-09-06 MED ORDER — ASCORBIC ACID 500 MG PO TABS: 500.0000 mg | ORAL_TABLET | Freq: Two times a day (BID) | ORAL | 0 refills | Status: AC

## 2022-09-06 MED ORDER — ZINC SULFATE 220 (50 ZN) MG PO CAPS: 220.0000 mg | ORAL_CAPSULE | Freq: Every day | ORAL | 0 refills | Status: AC

## 2022-09-06 MED ORDER — LEVETIRACETAM 750 MG PO TABS: 750.0000 mg | ORAL_TABLET | Freq: Two times a day (BID) | ORAL | 0 refills | Status: AC

## 2022-09-06 MED ORDER — GLUCERNA SHAKE PO LIQD: 237.0000 mL | Freq: Three times a day (TID) | ORAL | 0 refills | Status: AC

## 2022-09-06 MED ORDER — ADULT MULTIVITAMIN W/MINERALS CH: 1.0000 | ORAL_TABLET | Freq: Every day | ORAL | Status: AC

## 2022-09-06 NOTE — Plan of Care (Signed)
Patient discharged per MD orders at this time.All dc instructions, education and medications reviewed with the patient and daughter. Pt expressed understanding and will comply with dc instructions.f/u appointments was also communicated to the patient.no verbal c/o or any ssx of distress.Pt was discharged to the [facility] for STR/PT/OT services per order.report was called to staff nurse Langley Gauss before transport.Pt was transported to the facility by son in a privately owned vehicle.

## 2022-09-06 NOTE — Plan of Care (Signed)

## 2022-09-06 NOTE — TOC Progression Note (Signed)
Transition of Care [facility]) - Progression Note    Patient Details  Name: Rebecca Wolf MRN: [id number] Date of Birth: 1925/01/03  Transition of Care [facility]) CM/SW [place], RN Phone Number: 09/06/2022, 9:51 AM  Clinical Narrative:    The patient has received insurance auth to go to [facility], Room 603, her Family will transport   Expected Discharge Plan: [place] Barriers to Discharge: Continued Medical Work up  Expected Discharge Plan and Friend arrangements for the past 2 months: Single Family Home                                       Social Determinants of Health (SDOH) Interventions SDOH Screenings   Food Insecurity: No Food Insecurity (08/30/2022)  Housing: Low Risk  (08/30/2022)  Transportation Needs: No Transportation Needs (08/30/2022)  Utilities: Not At Risk (08/30/2022)  Tobacco Use: Medium Risk (09/02/2022)    Readmission Risk Interventions     No data to display

## 2022-09-06 NOTE — Discharge Summary (Signed)
Physician Discharge Summary   Patient: Rebecca Wolf MRN: [id number]  DOB: [birth year]   Admit:     Date of Admission: 08/30/2022 Admitted from: home   Discharge: Date of discharge: 09/06/22 Disposition: Skilled nursing facility Condition at discharge: good  CODE STATUS: FULL CODE      Discharge Physician: Emeterio Reeve, DO [facility]     PCP: Erline Levine, MD  Recommendations for Outpatient Follow-up:  Follow up with PCP Kimel-Scott, Santiago Glad, MD in 1-2 weeks Please obtain labs/tests: CBC, CMP, Mg, Phos in 1-2 weeks Please follow up on the following pending results: none    Discharge Instructions     Diet - low sodium heart healthy   Complete by: As directed    Diet Carb Modified   Complete by: As directed    Discharge wound care:   Complete by: As directed    Monitor sacral area - skin is intact now   Increase activity slowly   Complete by: As directed          Discharge Diagnoses: Principal Problem:   SOB (shortness of breath) Active Problems:   Chronic systolic CHF (congestive heart failure) (HCC)   Seizure disorder (HCC)   Pulmonary embolism (HCC)   CKD stage G3b/A1, GFR 30-44 and albumin creatinine ratio <30 mg/g (HCC)   Diabetes mellitus with no complication (HCC)   Anemia   Electrolyte abnormality   Acute on chronic HFrEF (heart failure with reduced ejection fraction) (HCC)   Malnutrition of moderate degree       Hospital Course: 89 years old female with medical history of hypertension, seizure disorder, CHF with EF of 20% and GERD Presented to the ED 08/30/2022 with complaint of shortness of breath.  Patient reports shortness of breath lasted about 30 minutes and then resolved.  Patient was asymptomatic on presentation in the ED.  As per daughter patient momentarily gets short of breath when she has to use the restroom  03/01: admitted to hospitalist service for CHF 03/02: bradycardic on amiodarone 200 mg daily.    03/03:  patient had an episode of seizure.  This was witnessed by patient's daughter in the room.  According to patient's daughter, neurologist was weaning patient off  Keppra.  Patient seen by neurologist who recommended going back on patient's previous Keppra dose of 750 twice daily.  03/04: stable, PT/OT recs for SNF  03/05: plan for SNF, auth pending, TOC working w/ family on placement, see notes. Cardiology team has cleared for discharge, to follow in office w/ Dr Clayborn Bigness in 1-2 weeks.  03/06-03/07: SNF placement pending auth 03/08: SNF approval, clear for discharge   Consultants:  Cardiology  Neurology   Procedures: none  Pertinent results:  Most recent 2D Echo shows EF of 15% at [place] C/W reduced ejection fraction heart failure     ASSESSMENT & PLAN:  Acute on chronic HFrEF Moderate to severe MR Shortness of breath d/t above  Clinical improvement following IV diuresis with IV Lasix twice daily.  Was previously taking 20 mg of p.o. Lasix daily at home restart 40 mg p.o. Lasix once daily   GDMT with losartan and spironolactone no BB with baseline bradycardia Consider SGLT2i as renal function allows No further cardiac diagnostics necessary ok for discharge from a cardiac perspective per note 03/05 Monitor BMP outpatient   Community-acquired pneumonia, present on admission SOB d/t above Chest x-ray obtained showed new infiltrates on the right side Continue ceftriaxone and azithromycin while inpatient and transition  on po abx on discharge if still needing more days to complete 5-7 days total --> augmentin + azithro   Chronic atrial flutter Rate controlled on telemetry CHA2DS2-VASc 7 (age, sex, hx VTE, CHF, HTN) Continue amiodarone 100 mg once daily - reduced d/t bradycardia  Eliquis 2.5 mg twice daily. Dose reduced per hematology (hx GIB).     Seizure disorder Acute seizure event 09/01/2022 At home patient takes Keppra 375 mg bid, weaning down IV Keppra has been  switched to oral Keppra 750 twice daily  Patient will be discharged on 750 mg of Keppra twice daily Seizure precaution.  Neurologist follow up outpatient   Hypomagnesemia Hypokalemia Now resolved  continue to monitor levels outpatient   Normocytic anemia No indication for transfusion at this time Continue to monitor CBC   Diabetes mellitus with no complications Continue to monitor glucose level Continue current insulin regimen   Hx Pulmonary embolism  Daughter said pt has h/o VTE and is on eliquis.  CTA neg for PE Continue Eliquis reduced dose            Discharge Instructions  Allergies as of 09/06/2022       Reactions   Levofloxacin Shortness Of Breath   Sulfa Antibiotics Rash        Medication List     STOP taking these medications    amoxicillin-clavulanate 875-125 MG tablet Commonly known as: AUGMENTIN Replaced by: amoxicillin-clavulanate 500-125 MG tablet   Comirnaty syringe Generic drug: COVID-19 mRNA vaccine 2023-2024   influenza vaccine adjuvanted 0.5 ML injection Commonly known as: FLUAD   levalbuterol 0.63 MG/3ML nebulizer solution Commonly known as: XOPENEX   potassium chloride SA 20 MEQ tablet Commonly known as: KLOR-CON M       TAKE these medications    acetaminophen 500 MG tablet Commonly known as: TYLENOL Take 500 mg by mouth every 6 (six) hours as needed.   albuterol 1.25 MG/3ML nebulizer solution Commonly known as: ACCUNEB Inhale 1 ampule into the lungs 4 (four) times daily as needed.   amiodarone 100 MG tablet Commonly known as: PACERONE Take 1 tablet (100 mg total) by mouth daily. What changed:  medication strength how much to take   amoxicillin-clavulanate 500-125 MG tablet Commonly known as: AUGMENTIN Take 1 tablet by mouth 2 (two) times daily for 2 days. Replaces: amoxicillin-clavulanate 875-125 MG tablet   apixaban 2.5 MG Tabs tablet Commonly known as: ELIQUIS Take 2.5 mg by mouth 2 (two) times daily.    ascorbic acid 500 MG tablet Commonly known as: VITAMIN C Take 1 tablet (500 mg total) by mouth 2 (two) times daily.   azelastine 0.1 % nasal spray Commonly known as: ASTELIN U 1 SPRAY IEN BID TO REPLACE PATANASE   azithromycin 500 MG tablet Commonly known as: Zithromax Take 1 tablet (500 mg total) by mouth daily for 2 days.   bisacodyl 5 MG EC tablet Commonly known as: DULCOLAX Take 1 tablet (5 mg total) by mouth daily as needed for moderate constipation.   budesonide 0.5 MG/2ML nebulizer solution Commonly known as: PULMICORT Inhale 0.5 mg into the lungs 2 (two) times daily as needed.   feeding supplement (GLUCERNA SHAKE) Liqd Take 237 mLs by mouth 3 (three) times daily between meals.   fluticasone 50 MCG/ACT nasal spray Commonly known as: FLONASE Place 2 sprays into the nose daily.   furosemide 20 MG tablet Commonly known as: LASIX Take 1 tablet (20 mg total) by mouth daily. Increase to 1 tablet (20 mg total) by mouth  TWICE daily (total daily dose max 40 mg) as needed for up to 3 days for increased leg swelling, shortness of breath, weight gain 5+ lbs over 1-2 days. Seek medical care if these symptoms are not improving with increased dose. What changed:  when to take this additional instructions   levETIRAcetam 750 MG tablet Commonly known as: KEPPRA Take 1 tablet (750 mg total) by mouth 2 (two) times daily.   losartan 25 MG tablet Commonly known as: COZAAR Take 1 tablet (25 mg total) by mouth daily.   magnesium oxide 400 (240 Mg) MG tablet Commonly known as: MAG-OX Take 1 tablet by mouth 2 (two) times daily.   montelukast 10 MG tablet Commonly known as: SINGULAIR Take 10 mg by mouth at bedtime.   multivitamin with minerals Tabs tablet Take 1 tablet by mouth daily.   pantoprazole 40 MG tablet Commonly known as: PROTONIX Take 1 tablet (40 mg total) by mouth daily.   polyethylene glycol 17 g packet Commonly known as: MIRALAX / GLYCOLAX Take 17 g by mouth  daily as needed for mild constipation.   predniSONE 5 MG tablet Commonly known as: DELTASONE Take 1 tablet (5 mg total) by mouth daily with breakfast. What changed:  how much to take Another medication with the same name was removed. Continue taking this medication, and follow the directions you see here.   spironolactone 25 MG tablet Commonly known as: ALDACTONE Take 0.5 tablets (12.5 mg total) by mouth daily. What changed: how much to take   zinc sulfate 220 (50 Zn) MG capsule Take 1 capsule (220 mg total) by mouth daily.               Discharge Care Instructions  (From admission, onward)           Start     Ordered   09/06/22 0000  Discharge wound care:       Comments: Monitor sacral area - skin is intact now   09/06/22 1052             Contact information for follow-up providers     Callwood, Karma Greaser D, MD. Go in 1 week(s).   Specialties: Cardiology, Internal Medicine Contact information: [place] [address] [phone]              Contact information for after-discharge care     [facility] .   Service: Skilled Nursing Contact information: [place] [address] [phone]                     Allergies  Allergen Reactions   Levofloxacin Shortness Of Breath   Sulfa Antibiotics Rash     Subjective: pt has no concerns this morning  Discharge Exam: BP (!) 100/54 (BP Location: Left Arm)   Pulse 94   Temp 98.3 F (36.8 C)   Resp 16   Ht '5\' 4"'$  (1.626 m)   Wt 68.9 kg   SpO2 97%   BMI 26.07 kg/m  General: Pt is alert, awake, not in acute distress Cardiovascular: RRR, S1/S2 +, no rubs, no gallops Respiratory: CTA bilaterally, no wheezing, no rhonchi Abdominal: Soft, NT, ND, bowel sounds + Extremities: no edema, no cyanosis     The results of significant  diagnostics from this hospitalization (including imaging, microbiology, ancillary and laboratory) are listed below for reference.  Microbiology: No results found for this or any previous visit (from the past 240 hour(s)).   Labs: BNP (last 3 results) Recent Labs    07/01/22 0748 08/27/22 1436 08/30/22 1501  BNP 219.4* 390.9* [id number]*   Basic Metabolic Panel: Recent Labs  Lab 09/01/22 0434 09/01/22 1856 09/02/22 0606 09/03/22 0632 09/04/22 0510 09/05/22 0350  NA 139  --  139 135 140 138  K 3.4*  --  4.3 4.1 3.7 3.7  CL 105  --  104 103 104 104  CO2 25  --  '27 26 26 25  '$ GLUCOSE 106*  --  108* 107* 107* 110*  BUN 15  --  '14 18 19 19  '$ CREATININE 0.98  --  0.96 1.09* 1.27* 1.12*  CALCIUM 8.1*  --  8.4* 8.3* 8.4* 8.4*  MG 1.3* 3.2* 2.4 2.0 1.9 2.2   Liver Function Tests: Recent Labs  Lab 08/31/22 0437 09/02/22 0606 09/03/22 0632 09/04/22 0510 09/05/22 0350  AST '24 24 21 23 25  '$ ALT '14 14 13 15 16  '$ ALKPHOS 111 109 103 115 117  BILITOT 0.5 0.3 0.7 0.6 0.5  PROT 5.9* 6.2* 5.7* 6.1* 6.2*  ALBUMIN 2.4* 2.7* 2.5* 2.7* 2.8*   No results for input(s): "LIPASE", "AMYLASE" in the last 168 hours. No results for input(s): "AMMONIA" in the last 168 hours. CBC: Recent Labs  Lab 08/30/22 1501 08/31/22 0437 09/03/22 1002 09/04/22 0510  WBC 5.3 6.0 6.4 6.8  NEUTROABS  --   --   --  3.7  HGB 10.8* 9.7* 10.1* 10.8*  HCT 35.7* 31.5* 32.5* 34.8*  MCV 76.9* 75.0* 74.2* 74.8*  PLT 248 227 266 290   Cardiac Enzymes: No results for input(s): "CKTOTAL", "CKMB", "CKMBINDEX", "TROPONINI" in the last 168 hours. BNP: Invalid input(s): "POCBNP" CBG: Recent Labs  Lab 09/05/22 0814 09/05/22 1155 09/05/22 1609 09/05/22 2121 09/06/22 0824  GLUCAP 109* 128* 107* 117* 103*   D-Dimer No results for input(s): "DDIMER" in the last 72 hours. Hgb A1c No results for input(s): "HGBA1C" in the last 72 hours. Lipid Profile No results for input(s): "CHOL", "HDL", "LDLCALC", "TRIG",  "CHOLHDL", "LDLDIRECT" in the last 72 hours. Thyroid function studies No results for input(s): "TSH", "T4TOTAL", "T3FREE", "THYROIDAB" in the last 72 hours.  Invalid input(s): "FREET3" Anemia work up No results for input(s): "VITAMINB12", "FOLATE", "FERRITIN", "TIBC", "IRON", "RETICCTPCT" in the last 72 hours. Urinalysis    Component Value Date/Time   COLORURINE YELLOW (A) 08/30/2022 1826   APPEARANCEUR TURBID (A) 08/30/2022 1826   APPEARANCEUR HAZY 04/19/2013 1133   LABSPEC 1.034 (H) 08/30/2022 1826   LABSPEC 1.015 04/19/2013 1133   PHURINE 5.0 08/30/2022 1826   GLUCOSEU NEGATIVE 08/30/2022 1826   GLUCOSEU NEGATIVE 04/19/2013 1133   HGBUR NEGATIVE 08/30/2022 1826   BILIRUBINUR NEGATIVE 08/30/2022 1826   BILIRUBINUR meg 02/13/2017 1155   BILIRUBINUR NEGATIVE 04/19/2013 1133   KETONESUR NEGATIVE 08/30/2022 1826   PROTEINUR NEGATIVE 08/30/2022 1826   UROBILINOGEN 0.2 02/13/2017 1155   NITRITE NEGATIVE 08/30/2022 1826   LEUKOCYTESUR NEGATIVE 08/30/2022 1826   LEUKOCYTESUR 2+ 04/19/2013 1133   Sepsis Labs Recent Labs  Lab 08/30/22 1501 08/31/22 0437 09/03/22 1002 09/04/22 0510  WBC 5.3 6.0 6.4 6.8   Microbiology No results found for this or any previous visit (from the past 240 hour(s)). Imaging DG Chest Port 1 View  Result Date: 09/03/2022 CLINICAL DATA:  Cough EXAM: PORTABLE CHEST 1 VIEW COMPARISON:  CXR 08/30/22 FINDINGS: Unchanged small left-sided pleural effusion. No right-sided pleural effusion.  No pneumothorax. Compared to prior exam there are new patchy bilateral airspace opacity, particularly in the right upper lung. No radiographically apparent displaced rib fractures. Visualized upper abdomen is unremarkable. IMPRESSION: 1. New patchy bilateral airspace opacity, particularly in the right upper lung, concerning for multifocal pneumonia. 2. Unchanged small left-sided pleural effusion. Electronically Signed   By: Marin Roberts M.D.   On: 09/03/2022 09:25   ECHOCARDIOGRAM  COMPLETE  Result Date: 09/02/2022    ECHOCARDIOGRAM REPORT   Patient Name:   QUINTANA PESHLAKAI Date of Exam: 09/02/2022 Medical Rec #:  [id number]     Height:       64.0 in Accession #:    [id number]    Weight:       157.8 lb Date of Birth:  1924/10/12      BSA:          1.769 m Patient Age:    67 years      BP:           101/64 mmHg Patient Gender: F             HR:           47 bpm. Exam Location:  [facility] Procedure: 2D Echo, Cardiac Doppler and Color Doppler Indications:     CHF I50.9  History:         Patient has no prior history of Echocardiogram examinations.                  Risk Factors:Hypertension.  Sonographer:     Sherrie Sport Referring Phys:  [place] Diagnosing Phys: Yolonda Kida MD IMPRESSIONS  1. Severely depressed LVF Globally.  2. Left ventricular ejection fraction, by estimation, is 20 to 25%. The left ventricle has severely decreased function. The left ventricle demonstrates global hypokinesis. The left ventricular internal cavity size was moderately dilated. There is moderate concentric left ventricular hypertrophy. Left ventricular diastolic parameters are consistent with Grade I diastolic dysfunction (impaired relaxation).  3. Right ventricular systolic function is moderately reduced. The right ventricular size is moderately enlarged. Mildly increased right ventricular wall thickness.  4. Left atrial size was severely dilated.  5. Right atrial size was moderately dilated.  6. The mitral valve is grossly normal. Moderate to severe mitral valve regurgitation.  7. Tricuspid valve regurgitation is mild to moderate.  8. The aortic valve is calcified. Aortic valve regurgitation is trivial. Aortic valve sclerosis/calcification is present, without any evidence of aortic stenosis. FINDINGS  Left Ventricle: Left ventricular ejection fraction, by estimation, is 20 to 25%. The left ventricle has severely decreased function. The left ventricle demonstrates global hypokinesis. The left ventricular  internal cavity size was moderately dilated. There is moderate concentric left ventricular hypertrophy. Abnormal (paradoxical) septal motion, consistent with left bundle branch block. Left ventricular diastolic parameters are consistent with Grade I diastolic dysfunction (impaired relaxation). Right Ventricle: The right ventricular size is moderately enlarged. Mildly increased right ventricular wall thickness. Right ventricular systolic function is moderately reduced. Left Atrium: Left atrial size was severely dilated. Right Atrium: Right atrial size was moderately dilated. Pericardium: There is no evidence of pericardial effusion. Mitral Valve: The mitral valve is grossly normal. Moderate to severe mitral valve regurgitation. Tricuspid Valve: The tricuspid valve is grossly normal. Tricuspid valve regurgitation is mild to moderate. Aortic Valve: The aortic valve is calcified. Aortic valve regurgitation is trivial. Aortic valve sclerosis/calcification is present, without any evidence of aortic stenosis. Aortic valve mean gradient measures 3.7 mmHg. Aortic  valve peak gradient measures 6.8 mmHg. Aortic valve area, by VTI measures 1.62 cm. Pulmonic Valve: The pulmonic valve was grossly normal. Pulmonic valve regurgitation is trivial. Aorta: The aortic root, ascending aorta and aortic arch are all structurally normal, with no evidence of dilitation or obstruction and the ascending aorta was not well visualized. IAS/Shunts: No atrial level shunt detected by color flow Doppler. Additional Comments: Severely depressed LVF Globally.  LEFT VENTRICLE PLAX 2D LVIDd:         4.80 cm      Diastology LVIDs:         4.20 cm      LV e' lateral:   16.80 cm/s LV PW:         1.00 cm      LV E/e' lateral: 5.9 LV IVS:        1.40 cm LVOT diam:     2.00 cm LV SV:         28 LV SV Index:   16 LVOT Area:     3.14 cm  LV Volumes (MOD) LV vol d, MOD A2C: 108.0 ml LV vol d, MOD A4C: 93.4 ml LV vol s, MOD A2C: 53.1 ml LV vol s, MOD A4C: 86.0  ml LV SV MOD A2C:     54.9 ml LV SV MOD A4C:     93.4 ml LV SV MOD BP:      36.0 ml RIGHT VENTRICLE RV Basal diam:  3.60 cm RV Mid diam:    3.10 cm RV S prime:     10.90 cm/s TAPSE (M-mode): 1.6 cm LEFT ATRIUM             Index        RIGHT ATRIUM           Index LA diam:        3.70 cm 2.09 cm/m   RA Area:     11.30 cm LA Vol (A2C):   52.5 ml 29.68 ml/m  RA Volume:   21.00 ml  11.87 ml/m LA Vol (A4C):   51.1 ml 28.88 ml/m LA Biplane Vol: 56.1 ml 31.71 ml/m  AORTIC VALVE AV Area (Vmax):    1.56 cm AV Area (Vmean):   1.50 cm AV Area (VTI):     1.62 cm AV Vmax:           130.00 cm/s AV Vmean:          86.700 cm/s AV VTI:            0.171 m AV Peak Grad:      6.8 mmHg AV Mean Grad:      3.7 mmHg LVOT Vmax:         64.70 cm/s LVOT Vmean:        41.300 cm/s LVOT VTI:          0.088 m LVOT/AV VTI ratio: 0.52  AORTA Ao Root diam: 2.90 cm MITRAL VALVE               TRICUSPID VALVE MV Area (PHT): 4.83 cm    TR Peak grad:   30.0 mmHg MV Decel Time: 157 msec    TR Vmax:        274.00 cm/s MV E velocity: 99.60 cm/s                            SHUNTS  Systemic VTI:  0.09 m                            Systemic Diam: 2.00 cm Yolonda Kida MD Electronically signed by Yolonda Kida MD Signature Date/Time: 09/02/2022/6:31:53 PM    Final       Time coordinating discharge: over 30 minutes  SIGNED:  Emeterio Reeve DO [facility]

## 2022-09-10 ENCOUNTER — Ambulatory Visit: Payer: Medicare Other | Admitting: Podiatry

## 2022-09-10 ENCOUNTER — Encounter: Payer: 59 | Admitting: Family

## 2022-09-13 ENCOUNTER — Emergency Department: Payer: 59

## 2022-09-13 ENCOUNTER — Other Ambulatory Visit: Payer: Self-pay

## 2022-09-13 ENCOUNTER — Emergency Department
Admission: EM | Admit: 2022-09-13 | Discharge: 2022-09-13 | Disposition: A | Discharge status: Home / Self Care | Payer: 59 | Attending: Emergency Medicine | Admitting: Emergency Medicine

## 2022-09-13 DIAGNOSIS — I5022 Chronic systolic (congestive) heart failure: Secondary | ICD-10-CM | POA: Diagnosis not present

## 2022-09-13 DIAGNOSIS — I130 Hypertensive heart and chronic kidney disease with heart failure and stage 1 through stage 4 chronic kidney disease, or unspecified chronic kidney disease: Secondary | ICD-10-CM | POA: Insufficient documentation

## 2022-09-13 DIAGNOSIS — S0990XA Unspecified injury of head, initial encounter: Secondary | ICD-10-CM | POA: Diagnosis not present

## 2022-09-13 DIAGNOSIS — M419 Scoliosis, unspecified: Secondary | ICD-10-CM | POA: Insufficient documentation

## 2022-09-13 DIAGNOSIS — M545 Low back pain, unspecified: Secondary | ICD-10-CM | POA: Insufficient documentation

## 2022-09-13 DIAGNOSIS — Z853 Personal history of malignant neoplasm of breast: Secondary | ICD-10-CM | POA: Diagnosis not present

## 2022-09-13 DIAGNOSIS — E1122 Type 2 diabetes mellitus with diabetic chronic kidney disease: Secondary | ICD-10-CM | POA: Diagnosis not present

## 2022-09-13 DIAGNOSIS — J45909 Unspecified asthma, uncomplicated: Secondary | ICD-10-CM | POA: Diagnosis not present

## 2022-09-13 DIAGNOSIS — W19XXXA Unspecified fall, initial encounter: Secondary | ICD-10-CM

## 2022-09-13 DIAGNOSIS — W06XXXA Fall from bed, initial encounter: Secondary | ICD-10-CM | POA: Diagnosis not present

## 2022-09-13 DIAGNOSIS — Z7901 Long term (current) use of anticoagulants: Secondary | ICD-10-CM | POA: Diagnosis not present

## 2022-09-13 DIAGNOSIS — N189 Chronic kidney disease, unspecified: Secondary | ICD-10-CM | POA: Insufficient documentation

## 2022-09-13 DIAGNOSIS — R0789 Other chest pain: Secondary | ICD-10-CM | POA: Diagnosis not present

## 2022-09-13 MED ORDER — ACETAMINOPHEN 500 MG PO TABS
1000.0000 mg | ORAL_TABLET | Freq: Once | ORAL | Status: AC
  Administered 2022-09-13: 1000 mg via ORAL
  Filled 2022-09-13: qty 2

## 2022-09-13 NOTE — ED Triage Notes (Signed)
BIB [facility] after rolling out of bed. Pt now c/o back pain. Denies hitting head or LOC. Pt reports chronic back pain. Pt breathing unlabored with symmetric chest rise and fall.

## 2022-09-13 NOTE — ED Notes (Signed)
Transport arrived to transport pt. Back home. Per previous RN, Edison Nasuti, daughter has been updated. This RN called [name], daughter and let her know her mother is being transported home currently. [name] is appreciative, and verbalizes understanding and denies any further questions about her mothers condition, or discharge.

## 2022-09-13 NOTE — ED Provider Notes (Addendum)
[facility] Provider Note    Event Date/Time   First MD Initiated Contact with Patient 09/13/22 [phone]     (approximate)   History   Back Pain and Fall   HPI  Rebecca Wolf is a 89 y.o. female past medical history of chronic systolic heart failure, seizure disorder, CKD, diabetes, PE on Eliquis who presents after a fall.  Patient rolled out of bed fell onto her right side.  He denies feeling any lightheadedness.  Did not hit her head.  She complains of low back pain.  Tells me she does not typically have back pain.  Denies numbness or tingling in her legs.  Says that prior to the fall she was feeling well denies fevers chills nausea vomiting or chest pain or dyspnea.  Patient's daughter is at bedside thinks that the patient was trying to get out of bed and did not have her normal slippers on which likely caused her to fall.     Past Medical History:  Diagnosis Date   Asthma    Breast cancer ([place]) 2003   LT LUMPECTOMY   Breast cancer ([place]) 2004   LT LUMPECTOMY   Bronchiectasis ([place])    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   GERD (gastroesophageal reflux disease)    Hemorrhoids    Hypertension    Personal history of chemotherapy 2004   BREAST CA   Personal history of radiation therapy 2003   BREAST CA   Personal history of radiation therapy 2004   BREAST CA   Polyp of colon 2002   bleeding polyp of right ascending colon   PUD (peptic ulcer disease)    Seizures ([place])    epilepsy well controlled    Patient Active Problem List   Diagnosis Date Noted   Malnutrition of moderate degree 09/03/2022   Acute on chronic HFrEF (heart failure with reduced ejection fraction) ([place]) 09/02/2022   Electrolyte abnormality 08/30/2022   SOB (shortness of breath) 08/30/2022   Acute blood loss anemia 01/16/2022   Overweight (BMI 25.0-29.9) [id number]   Chronic systolic CHF (congestive heart failure) ([place]) 01/16/2022   Hypomagnesemia    Leukocytosis     GIB (gastrointestinal bleeding) 01/15/2022   Pressure injury of skin 01/15/2022   B12 deficiency 10/17/2019   Folate deficiency 10/17/2019   Anemia 08/29/2019   Iron deficiency anemia 08/26/2019   Chronic anticoagulation 10/25/2018   CKD stage G3b/A1, GFR 30-44 and albumin creatinine ratio <30 mg/g ([place]) 10/05/2018   MGUS (monoclonal gammopathy of unknown significance) 07/23/2018   Osteopenia after menopause 07/16/2018   Diabetes mellitus with no complication ([place]) [id number]   Vitamin D deficiency 04/07/2018   Heart palpitations 01/06/2018   Tachycardia 01/06/2018   Cystocele with rectocele 10/30/2017   Rectocele 10/30/2017   Microcytic red blood cells 09/23/2017   Localized edema 02/13/2017   Hemorrhoids 05/17/2016   Obesity (BMI 30-39.9) 04/26/2016   Multiple lung nodules 04/19/2016   Chest pain 04/17/2016   Partial symptomatic epilepsy with complex partial seizures, not intractable, without status epilepticus ([place]) 06/30/2015   Bronchiectasis without complication ([place]) [id number]   Imbalance 11/08/2014   Hypertension 11/08/2014   Personal history of malignant neoplasm of breast 11/08/2014   History of malignant carcinoid tumor of bronchus and lung 11/08/2014   H/O peptic ulcer 11/08/2014   Asthma, mild intermittent 11/08/2014   Pulmonary embolism ([place]) 11/08/2014   Gonalgia 11/08/2014   Seizure disorder ([place]) 11/08/2014   Leg varices 11/08/2014  Physical Exam  Triage Vital Signs: ED Triage Vitals  Enc Vitals Group     BP 09/13/22 0324 114/78     Pulse Rate 09/13/22 0323 94     Resp 09/13/22 0323 18     Temp 09/13/22 0324 97.7 F (36.5 C)     Temp Source 09/13/22 0323 Oral     SpO2 09/13/22 0324 98 %     Weight 09/13/22 0324 160 lb (72.6 kg)     Height 09/13/22 0324 5\' 4"  (1.626 m)     Head Circumference --      Peak Flow --      Pain Score --      Pain Loc --      Pain Edu? --      Excl. in [place]? --     Most recent vital signs: Vitals:   09/13/22  0421 09/13/22 0510  BP: 114/76 130/73  Pulse: 92 (!) 44  Resp: 16   Temp:    SpO2: 96% 98%     General: Awake, no distress.  Patient is kyphotic, chronically ill-appearing CV:  Good peripheral perfusion.  Resp:  Normal effort.  Abd:  No distention.  Neuro:             Awake, Alert, Oriented x 3  Other:  No signs of trauma to the head or neck No C or T-spine tenderness,+ midline L-spine tenderness without step-off or signs of trauma Tenderness to palpation of the right hip but patient is able to range without significant pain There is a small abrasion over the right shoulder but no deformity minimal tenderness Mild right sided anterior rib tenderness no crepitus, no ecchymosis Abdomen is soft and nontender 5/5 strength in BL lower extremities with plantar and dorsiflexion    ED Results / Procedures / Treatments  Labs (all labs ordered are listed, but only abnormal results are displayed) Labs Reviewed - No data to display   EKG     RADIOLOGY I reviewed and interpreted the CT scan of the brain which does not show any acute intracranial process    PROCEDURES:  Critical Care performed: No  Procedures  The patient is on the cardiac monitor to evaluate for evidence of arrhythmia and/or significant heart rate changes.   MEDICATIONS ORDERED IN ED: Medications  acetaminophen (TYLENOL) tablet 1,000 mg (1,000 mg Oral Given 09/13/22 0420)     IMPRESSION / MDM / ASSESSMENT AND PLAN / ED COURSE  I reviewed the triage vital signs and the nursing notes.                              Patient's presentation is most consistent with acute presentation with potential threat to life or bodily function.  Differential diagnosis includes, but is not limited to, intracranial hemorrhage, lumbar spine fracture, musculoskeletal pain, rib fracture, pneumothorax  Patient is a 89 year old female with multiple medical comorbidities on Eliquis who presents after a fall.  Patient fell out of  bed somewhat unclear exactly how this happened.  She does not think that she lost consciousness did not feel dizzy before but cannot tell me whether she was trying to get out of bed.  Her daughter thinks that she likely was trying to get out of bed and did not have her slippers on.  Her primary complaint is low back pain without numbness or tingling in her legs.  Denies head strike.  On exam she is chronically ill-appearing  but not in distress she is alert and oriented there are no clear signs of trauma she has no C or T-spine tenderness.  Does have midline L-spine tenderness.  Neurologically intact in the lower extremities.  She does have some right hip tenderness as well but is able to range that without significant discomfort.  Abdominal exam is benign she does have some right anterior chest wall tenderness.  Plan to obtain CT head C-spine and CT of the L-spine as well as x-rays of hip shoulder and chest.  Will give Tylenol for pain.  Patient's imaging is reassuring.  Given she is otherwise at her baseline without other signs of acute illness today I think that she can appropriately be discharged.  Patient's daughter is at bedside updated.      FINAL CLINICAL IMPRESSION(S) / ED DIAGNOSES   Final diagnoses:  Fall, initial encounter     Rx / DC Orders   ED Discharge Orders     None        Note:  This document was prepared using Dragon voice recognition software and may include unintentional dictation errors.   Rada Hay, MD 09/13/22 [id number]    Rada Hay, MD 09/13/22 0507    Rada Hay, MD 09/13/22 [phone]

## 2022-09-13 NOTE — ED Notes (Signed)
Per request pt. Provided phone to call daughter. Pt. Is very concerned that she has not been transported back to facility. This RN assured pt. That she is on the list for transport, they are just very busy. This RN notifies pt. She will update her with any news on transport.

## 2022-09-13 NOTE — ED Notes (Signed)
called to [facility] for patient transport home/rep:[email or web address].Marland Kitchen

## 2022-09-13 NOTE — Discharge Instructions (Addendum)
The CAT scan of your head neck and low back were all reassuring.  We also got x-rays of your right hip right shoulder and your chest which were also normal.  You can take Tylenol for pain.  You are likely to be more sore tomorrow.  If you are developing any new pain that is concerning to you please return to the emergency department.

## 2022-09-13 NOTE — ED Notes (Signed)
Pt ambulated to the toilet with walker and RN. Pt had a bowel movement and was ambulated back to bed. Brief was placed and pt is back in bed with call light within reach. No other needs at this time.

## 2022-09-13 NOTE — ED Notes (Signed)
Ambulated pt to the toilet for small bowel movement. Clean brief placed. PT back in bed with call light and safety precautions in place.

## 2022-09-14 DIAGNOSIS — I509 Heart failure, unspecified: Secondary | ICD-10-CM

## 2022-09-20 ENCOUNTER — Encounter: Payer: 59 | Admitting: Family

## 2022-09-25 ENCOUNTER — Encounter: Payer: 59 | Admitting: Family

## 2022-09-29 NOTE — Progress Notes (Deleted)
   Patient ID: ROSHAUNDA CHOMA, female    DOB: 23-Mar-1925, 89 y.o.   MRN: [id number]  HPI  Ms Juris is a 89 y/o female with a history of  Echo 09/02/22: EF 20-25% with moderate LVH, severe LAE, moderate RAE, moderate/ severe MR & mild/ moderate TR.   Was in the ED 09/13/22 due to back pain after a mechanical fall. Admitted 08/30/22 due to SOB due to HF exacerbation. Had a seizure. IV diuresed. Antibiotics given for pneumonia. Was in the ED 08/27/22 d/t SOB.   She presents today for her initial visit with a chief complaint of   Review of Systems    Physical Exam  Assessment & Plan:  1: Chronic heart failure with reduced ejection fraction- - NYHA class  - Echo 09/02/22: EF 20-25% with moderate LVH, severe LAE, moderate RAE, moderate/ severe MR & mild/ moderate TR.  - saw pulmonology Raul Del) 08/29/22 - BNP 08/30/22 was 553.4  2: HTN- - BP - telemedicine visit with PCP 09/23/22 - BMP 09/05/22 showed sodium 138, potassium 3.7, creatinine 1.12 and GFR 45  3: DM- - A1c 08/30/22 was 7.6%  4: Atrial fibrillation- - saw cardiology Clayborn Bigness) 09/19/22

## 2022-09-30 ENCOUNTER — Telehealth: Payer: Self-pay | Admitting: Family

## 2022-09-30 ENCOUNTER — Encounter: Payer: 59 | Admitting: Family

## 2022-09-30 ENCOUNTER — Telehealth (INDEPENDENT_AMBULATORY_CARE_PROVIDER_SITE_OTHER): Payer: Self-pay

## 2022-09-30 NOTE — Telephone Encounter (Signed)
LVM for pt's daughter (see HIPAA - [facility] wide). She LVMOM inquiring if we had a referral for "compression pump". I LVM for daughter [name] advising that the pt has an appt on 4.15.24 at 1:15 with Dr. Delana Meyer. I advised to call if this was not what was needed or to get directions etc.

## 2022-09-30 NOTE — Telephone Encounter (Signed)
Patient did not show for her initial [facility] appointment on 09/30/22.

## 2022-10-04 ENCOUNTER — Telehealth: Payer: Self-pay | Admitting: Family

## 2022-10-04 ENCOUNTER — Encounter: Payer: 59 | Admitting: Family

## 2022-10-04 NOTE — Telephone Encounter (Signed)
Patient did not show for her initial [facility] appointment on 10/04/22.

## 2022-10-08 ENCOUNTER — Ambulatory Visit: Payer: 59 | Admitting: Podiatry

## 2022-10-13 ENCOUNTER — Emergency Department
Admission: EM | Admit: 2022-10-13 | Discharge: 2022-10-13 | Disposition: A | Discharge status: Home / Self Care | Payer: 59 | Attending: Emergency Medicine | Admitting: Emergency Medicine

## 2022-10-13 ENCOUNTER — Emergency Department: Payer: 59

## 2022-10-13 ENCOUNTER — Other Ambulatory Visit: Payer: Self-pay

## 2022-10-13 DIAGNOSIS — R4182 Altered mental status, unspecified: Secondary | ICD-10-CM | POA: Diagnosis not present

## 2022-10-13 DIAGNOSIS — J45909 Unspecified asthma, uncomplicated: Secondary | ICD-10-CM | POA: Insufficient documentation

## 2022-10-13 DIAGNOSIS — R350 Frequency of micturition: Secondary | ICD-10-CM | POA: Insufficient documentation

## 2022-10-13 DIAGNOSIS — I4891 Unspecified atrial fibrillation: Secondary | ICD-10-CM | POA: Insufficient documentation

## 2022-10-13 LAB — CBC
HCT: 37 % (ref 36.0–46.0)
Hemoglobin: 11.5 g/dL — ABNORMAL LOW (ref 12.0–15.0)
MCH: 24.2 pg — ABNORMAL LOW (ref 26.0–34.0)
MCHC: 31.1 g/dL (ref 30.0–36.0)
MCV: 77.9 fL — ABNORMAL LOW (ref 80.0–100.0)
Platelets: 234 10*3/uL (ref 150–400)
RBC: 4.75 MIL/uL (ref 3.87–5.11)
RDW: 20.7 % — ABNORMAL HIGH (ref 11.5–15.5)
WBC: 7.3 10*3/uL (ref 4.0–10.5)
nRBC: 0 % (ref 0.0–0.2)

## 2022-10-13 LAB — COMPREHENSIVE METABOLIC PANEL
ALT: 23 U/L (ref 0–44)
AST: 29 U/L (ref 15–41)
Albumin: 3.4 g/dL — ABNORMAL LOW (ref 3.5–5.0)
Alkaline Phosphatase: 101 U/L (ref 38–126)
Anion gap: 11 (ref 5–15)
BUN: 44 mg/dL — ABNORMAL HIGH (ref 8–23)
CO2: 24 mmol/L (ref 22–32)
Calcium: 9.3 mg/dL (ref 8.9–10.3)
Chloride: 102 mmol/L (ref 98–111)
Creatinine, Ser: 1.17 mg/dL — ABNORMAL HIGH (ref 0.44–1.00)
GFR, Estimated: 43 mL/min — ABNORMAL LOW (ref >60)
Glucose, Bld: 207 mg/dL — ABNORMAL HIGH (ref 70–99)
Potassium: 4.5 mmol/L (ref 3.5–5.1)
Sodium: 137 mmol/L (ref 135–145)
Total Bilirubin: 0.6 mg/dL (ref 0.3–1.2)
Total Protein: 6.6 g/dL (ref 6.5–8.1)

## 2022-10-13 LAB — URINALYSIS, COMPLETE (UACMP) WITH MICROSCOPIC
Bacteria, UA: NONE SEEN
Bilirubin Urine: NEGATIVE
Glucose, UA: NEGATIVE mg/dL
Hgb urine dipstick: NEGATIVE
Ketones, ur: NEGATIVE mg/dL
Leukocytes,Ua: NEGATIVE
Nitrite: NEGATIVE
Protein, ur: NEGATIVE mg/dL
Specific Gravity, Urine: 1.011 (ref 1.005–1.030)
WBC, UA: NONE SEEN WBC/hpf (ref 0–5)
pH: 7 (ref 5.0–8.0)

## 2022-10-13 LAB — TROPONIN I (HIGH SENSITIVITY): Troponin I (High Sensitivity): 24 ng/L — ABNORMAL HIGH (ref <18)

## 2022-10-13 LAB — CBG MONITORING, ED: Glucose-Capillary: 233 mg/dL — ABNORMAL HIGH (ref 70–99)

## 2022-10-13 MED ORDER — SODIUM CHLORIDE 0.9 % IV BOLUS
1000.0000 mL | Freq: Once | INTRAVENOUS | Status: AC
  Administered 2022-10-13: 1000 mL via INTRAVENOUS

## 2022-10-13 NOTE — Discharge Instructions (Signed)
As we discussed her workup in the emergency department was overall normal and reassuring including lab work and a CT scan of the head.  Please follow-up with her neurologist as soon as possible for recheck/reevaluation.  Return to the emergency department for any seizure-like activity any confusion or any other symptom personally concerning to yourself.

## 2022-10-13 NOTE — ED Triage Notes (Signed)
Pt arrived via [facility] from home with initial c/o seizure like activity. Per EMS, pt stares off into space and won't respond to family. Pt and family states that pt's over active bladder has just been out of control and that pt is up to the bathroom every 30 mins. Pt reports not getting any sleep for several weeks because she's always up to the bathroom. Pt had a treatment for her OAB on Wednesday and pt's family states that it has gotten worse since then.

## 2022-10-13 NOTE — ED Provider Notes (Signed)
[facility] Provider Note    Event Date/Time   First MD Initiated Contact with Patient 10/13/22 1707     (approximate)  History   Chief Complaint: Altered mental status  HPI  Rebecca Wolf is a 89 y.o. female with a past medical history of asthma, gastric reflux, seizures, presents to the emergency department for altered mental status.  According to EMS per daughter who lives at home with the patient she states they have noticed a foul smell to the patient's urine recently and she has been urinating much more frequently than normal.  States several times today when she was urinating she was staring off into space and was less responsive although was conscious and answering questions throughout per report.  Here the patient is awake alert she is oriented x 3.  Awaiting daughter arrival for further history.  There is a documented seizure history in the chart and per chart review patient appears to be on Keppra.  Physical Exam   Triage Vital Signs: ED Triage Vitals  Enc Vitals Group     BP      Pulse      Resp      Temp      Temp src      SpO2      Weight      Height      Head Circumference      Peak Flow      Pain Score      Pain Loc      Pain Edu?      Excl. in [place]?     Most recent vital signs: There were no vitals filed for this visit.  General: Awake, no distress.  CV:  Good peripheral perfusion.  Regular rate and rhythm  Resp:  Normal effort.  Equal breath sounds bilaterally.  Abd:  No distention.  Soft, nontender.  No rebound or guarding.  ED Results / Procedures / Treatments   EKG  EKG viewed and interpreted by myself shows atrial flutter at 54 bpm with a slightly widened QRS, normal axis.  No concerning interval findings.  Nonspecific ST changes.  History of atrial fibrillation previously, on Eliquis.  RADIOLOGY  I have reviewed and interpreted the CT head images I do not see any obvious bleed on my evaluation.   MEDICATIONS ORDERED  IN ED: Medications  sodium chloride 0.9 % bolus 1,000 mL (has no administration in time range)     IMPRESSION / MDM / ASSESSMENT AND PLAN / ED COURSE  I reviewed the triage vital signs and the nursing notes.  Patient's presentation is most consistent with acute presentation with potential threat to life or bodily function.  Patient presents emergency department for episodes of altered mental status which the daughter per EMS described her staring off into space but remaining conscious and able to answer questions.  States a foul smell to the urine with urinary frequency recently as well.  Patient has a history of seizures per chart review is currently on Keppra.  Awaiting daughter arrival for further history regarding this.  In the meantime we will check labs we will obtain a CT scan of the head as a precaution and obtain a urine sample.  Will IV hydrate and continue to closely monitor while awaiting results.  Patient agreeable to plan of care.  Patient's workup is overall reassuring, urinalysis is normal with no signs of infection.  CBC has resulted showing normal results with a normal white blood  cell count, chemistry shows no significant findings.  Troponin not significantly elevated.  CT scan of the head is negative for any acute abnormality.  Daughter is now here with the patient he states that the patient is acting at her baseline.  I discussed with the daughter that it could be possible that the patient was experiencing more absence seizure's, has not had any further symptoms here.  Given the patient's reassuring workup daughter feels safe bringing the patient home they will follow-up with her neurologist.  Provided typical seizure return precautions.  FINAL CLINICAL IMPRESSION(S) / ED DIAGNOSES   Altered mental status    Note:  This document was prepared using Dragon voice recognition software and may include unintentional dictation errors.   Minna Antis, MD 10/13/22 2004

## 2022-10-14 ENCOUNTER — Encounter (INDEPENDENT_AMBULATORY_CARE_PROVIDER_SITE_OTHER): Payer: 59 | Admitting: Vascular Surgery

## 2022-10-22 ENCOUNTER — Emergency Department: Payer: 59

## 2022-10-22 ENCOUNTER — Other Ambulatory Visit: Payer: Self-pay

## 2022-10-22 ENCOUNTER — Emergency Department
Admission: EM | Admit: 2022-10-22 | Discharge: 2022-10-22 | Disposition: A | Discharge status: Home / Self Care | Payer: 59 | Attending: Emergency Medicine | Admitting: Emergency Medicine

## 2022-10-22 DIAGNOSIS — R001 Bradycardia, unspecified: Secondary | ICD-10-CM | POA: Diagnosis not present

## 2022-10-22 DIAGNOSIS — I110 Hypertensive heart disease with heart failure: Secondary | ICD-10-CM | POA: Diagnosis not present

## 2022-10-22 DIAGNOSIS — Z79899 Other long term (current) drug therapy: Secondary | ICD-10-CM | POA: Insufficient documentation

## 2022-10-22 DIAGNOSIS — R531 Weakness: Secondary | ICD-10-CM | POA: Diagnosis not present

## 2022-10-22 DIAGNOSIS — W19XXXA Unspecified fall, initial encounter: Secondary | ICD-10-CM | POA: Insufficient documentation

## 2022-10-22 DIAGNOSIS — S89201A Unspecified physeal fracture of upper end of right fibula, initial encounter for closed fracture: Secondary | ICD-10-CM | POA: Diagnosis not present

## 2022-10-22 DIAGNOSIS — M25571 Pain in right ankle and joints of right foot: Secondary | ICD-10-CM | POA: Diagnosis present

## 2022-10-22 DIAGNOSIS — I5020 Unspecified systolic (congestive) heart failure: Secondary | ICD-10-CM | POA: Insufficient documentation

## 2022-10-22 DIAGNOSIS — Z7901 Long term (current) use of anticoagulants: Secondary | ICD-10-CM | POA: Diagnosis not present

## 2022-10-22 DIAGNOSIS — Z950 Presence of cardiac pacemaker: Secondary | ICD-10-CM | POA: Diagnosis not present

## 2022-10-22 DIAGNOSIS — S82839A Other fracture of upper and lower end of unspecified fibula, initial encounter for closed fracture: Secondary | ICD-10-CM

## 2022-10-22 DIAGNOSIS — R55 Syncope and collapse: Secondary | ICD-10-CM | POA: Diagnosis not present

## 2022-10-22 LAB — CBC
HCT: 39.3 % (ref 36.0–46.0)
Hemoglobin: 11.8 g/dL — ABNORMAL LOW (ref 12.0–15.0)
MCH: 24 pg — ABNORMAL LOW (ref 26.0–34.0)
MCHC: 30 g/dL (ref 30.0–36.0)
MCV: 80 fL (ref 80.0–100.0)
Platelets: 216 10*3/uL (ref 150–400)
RBC: 4.91 MIL/uL (ref 3.87–5.11)
RDW: 20.3 % — ABNORMAL HIGH (ref 11.5–15.5)
WBC: 6.7 10*3/uL (ref 4.0–10.5)
nRBC: 0 % (ref 0.0–0.2)

## 2022-10-22 LAB — MAGNESIUM: Magnesium: 2 mg/dL (ref 1.7–2.4)

## 2022-10-22 LAB — BASIC METABOLIC PANEL
Anion gap: 11 (ref 5–15)
BUN: 28 mg/dL — ABNORMAL HIGH (ref 8–23)
CO2: 21 mmol/L — ABNORMAL LOW (ref 22–32)
Calcium: 8.8 mg/dL — ABNORMAL LOW (ref 8.9–10.3)
Chloride: 104 mmol/L (ref 98–111)
Creatinine, Ser: 1.14 mg/dL — ABNORMAL HIGH (ref 0.44–1.00)
GFR, Estimated: 44 mL/min — ABNORMAL LOW (ref >60)
Glucose, Bld: 157 mg/dL — ABNORMAL HIGH (ref 70–99)
Potassium: 3.6 mmol/L (ref 3.5–5.1)
Sodium: 136 mmol/L (ref 135–145)

## 2022-10-22 LAB — TROPONIN I (HIGH SENSITIVITY)
Troponin I (High Sensitivity): 20 ng/L — ABNORMAL HIGH (ref <18)
Troponin I (High Sensitivity): 20 ng/L — ABNORMAL HIGH (ref <18)

## 2022-10-22 MED ORDER — SODIUM CHLORIDE 0.9 % IV BOLUS: 1000.0000 mL | Freq: Once | INTRAVENOUS | Status: DC

## 2022-10-22 MED ORDER — SODIUM CHLORIDE 0.9 % IV BOLUS
500.0000 mL | Freq: Once | INTRAVENOUS | Status: AC
  Administered 2022-10-22: 500 mL via INTRAVENOUS

## 2022-10-22 NOTE — ED Triage Notes (Signed)
Pt comes with c/o possible seizure. Pt had a episode with staring off. Pt does have hx of seizure per family.  Pt denies any pain.

## 2022-10-22 NOTE — Discharge Instructions (Addendum)
Stop taking amiodarone.  Follow-up with her cardiologist on Thursday.  Follow-up with her neurologist on Friday at her scheduled visit.  Return to the emergency department for any worsening symptoms or any concerns.

## 2022-10-22 NOTE — ED Triage Notes (Signed)
First RN note  Pt BIB [facility] from home for a possible seizure. Pt has history of seizures. Pt had an episode today, similar to an episode yesterday when pt had a blank stare. Pt reports right leg pain, post fall  EMS vitals: VSS 150/60

## 2022-10-22 NOTE — ED Notes (Signed)
Pt daughter has arranged for medical transport back home. Charge RN Morrie Sheldon and Erie Noe RN notified. Ok to keep pt In room until ride arrives due to symptomatic bradycardia. Pt notified. Pt remains connected to [facility] monitoring with IV in place.

## 2022-10-22 NOTE — ED Provider Notes (Signed)
[facility] Provider Note    Event Date/Time   First MD Initiated Contact with Patient 10/22/22 1504     (approximate)   History   Weakness   HPI  Rebecca Wolf is a 89 y.o. female past medical history significant for HFrEF (15-20%), chronic atrial fibrillation on amiodarone and reduced dose apixaban, hypertension, seizure disorder on Keppra, who presents to the emergency department following 2 episodes of passing out.  First episode occurred yesterday whenever she woke up with the daughter.  States that she went to go stand to get out of bed and then fell backwards into bed.  States that she was staring off into space and not responding appropriately.  Within 1 minute returned back to her normal mental status.  Worked with PT and OT yesterday without any difficulties.  Has been more tired since being up with urinary frequency and incontinence.  Today had an episode where she again was waking up, felt like she needed to go to the bathroom, when she went to sit down on the toilet stared off into space, generalized weakness, no tongue biting or urinary incontinence, daughter was there to help lower her to the ground.  Denies any head injury.  Did hit her right knee.  States that within 10 seconds had returned back to her normal and was able to state her name, her daughter's name and asked why she was on the floor.  States that she is acting her normal self at this time.  Slight differences than prior seizure like activity that she has had in the past.  Recent hospitalization for CHF exacerbation.  Couple of changes of her home medications at that time.     Physical Exam   Triage Vital Signs: ED Triage Vitals  Enc Vitals Group     BP 10/22/22 1416 (!) 94/56     Pulse Rate 10/22/22 1416 (!) 52     Resp 10/22/22 1416 18     Temp 10/22/22 1416 98 F (36.7 C)     Temp src --      SpO2 10/22/22 1416 97 %     Weight --      Height --      Head Circumference --       Peak Flow --      Pain Score 10/22/22 1415 0     Pain Loc --      Pain Edu? --      Excl. in GC? --     Most recent vital signs: Vitals:   10/22/22 1930 10/22/22 1939  BP: (!) 128/45   Pulse: (!) 45   Resp: 18   Temp:  97.9 F (36.6 C)  SpO2: 100%     Physical Exam Constitutional:      Appearance: She is well-developed.     Comments: Somnolent and sleeping in the room but then immediately wakes up and responds to her name and verbal stimuli  HENT:     Head: Atraumatic.  Eyes:     Conjunctiva/sclera: Conjunctivae normal.  Cardiovascular:     Rate and Rhythm: Regular rhythm. Bradycardia present.  Pulmonary:     Effort: No respiratory distress.  Abdominal:     General: There is no distension.     Tenderness: There is no abdominal tenderness.  Musculoskeletal:        General: Normal range of motion.     Cervical back: Normal range of motion. No tenderness.     Comments:  Chronic lymphedema to bilateral lower extremities.  No lower extremity edema.  No unilateral leg swelling.  Mild tenderness to palpation to the right knee.  No tenderness or pain with movement of bilateral hips.  No tenderness to the left thigh, knee, lower leg or ankle  Skin:    General: Skin is warm.     Capillary Refill: Capillary refill takes 2 to 3 seconds.  Neurological:     Mental Status: She is alert and oriented to person, place, and time. Mental status is at baseline.  Psychiatric:        Mood and Affect: Mood normal.     IMPRESSION / MDM / ASSESSMENT AND PLAN / ED COURSE  I reviewed the triage vital signs and the nursing notes.  On chart review patient had a recent hospitalization for CHF exacerbation.  Patient followed up with cardiology given that she had heart failure and bradycardia while in the hospital.  Follow-up cardiology visit decreased her amiodarone to 50 mg daily secondary to bradycardia.  Increased her losartan and Lasix to 20 mg.  Differential diagnosis including syncope versus  seizure, electrolyte abnormality, dehydration.  Clinical picture today sounds more consistent with a syncopal episode given no postictal period, no tongue or urinary incontinence.  EKG  I, Corena Herter, the attending physician, personally viewed and interpreted this ECG.  Atrial flutter with 4:1 conduction with a heart rate of 50.  No significant ST elevation or depression.  No signs of acute ischemia or dysrhythmia.  Atrial flutter with bradycardia, and frequent PVCs on cardiac telemetry.  Bradycardic down to 37 but remains consistently at 50.  RADIOLOGY I independently reviewed imaging, my interpretation of imaging: CT scan of the head shows no signs of intracranial hemorrhage.  Chest x-ray and x-ray of the right knee  X-ray of the right knee concerning for proximal fibular fracture.  Chest x-ray with no signs of pneumonia.  Added on x-ray of the right ankle with no acute fracture or dislocation.  Read as no acute findings.  LABS (all labs ordered are listed, but only abnormal results are displayed) Labs interpreted as -    Labs Reviewed  BASIC METABOLIC PANEL - Abnormal; Notable for the following components:      Result Value   CO2 21 (*)    Glucose, Bld 157 (*)    BUN 28 (*)    Creatinine, Ser 1.14 (*)    Calcium 8.8 (*)    GFR, Estimated 44 (*)    All other components within normal limits  CBC - Abnormal; Notable for the following components:   Hemoglobin 11.8 (*)    MCH 24.0 (*)    RDW 20.3 (*)    All other components within normal limits  TROPONIN I (HIGH SENSITIVITY) - Abnormal; Notable for the following components:   Troponin I (High Sensitivity) 20 (*)    All other components within normal limits  TROPONIN I (HIGH SENSITIVITY) - Abnormal; Notable for the following components:   Troponin I (High Sensitivity) 20 (*)    All other components within normal limits  MAGNESIUM  CBG MONITORING, ED     MDM  Clinical picture is most consistent with syncopal episodes  today.  Concerned that the patient is having symptomatic bradycardia.  Discussed the patient's case with her cardiologist Dr. Juliann Pares who recommended discontinuing amiodarone.  Possible pacemaker for her bradycardia however patient is on Eliquis and will need to be held prior to pacemaker placement, uncertain if the patient would be a  good candidate for pacemaker or if this was something that she would want.  Recommended outpatient follow-up versus admission.  After discussion with the daughter at bedside stated that she did not feel that the patient would be a good candidate for pacemaker did not want a pacemaker placed.  States that she would hold the home amiodarone.  X-ray of the right ankle with no acute fracture.  Patient was placed in a knee immobilizer for the proximal fibular fracture with weight-bear as tolerated.  Recommended admission to the hospital for rehab placement and further management of her bradycardia that has been symptomatic.  Patient's daughter stated that she would not want to go to rehab and that she feels like she can care for her at home.  States that they have a bedside commode and multiple walkers at home.  States that she had gone to rehab in the past and she had to stay there the whole time in order to care for her did not feel that she was getting appropriate care.  Stated that there was different rehab facilities.  Ultimately stated that she wanted to go home and follow-up as an outpatient and has a follow-up appointment with cardiology and neurology this week.  Given information to follow-up with outpatient orthopedics.     PROCEDURES:  Critical Care performed: No  Procedures  Patient's presentation is most consistent with acute presentation with potential threat to life or bodily function.   MEDICATIONS ORDERED IN ED: Medications  sodium chloride 0.9 % bolus 500 mL (0 mLs Intravenous Stopped 10/22/22 1725)    FINAL CLINICAL IMPRESSION(S) / ED DIAGNOSES    Final diagnoses:  Bradycardia  Syncope, unspecified syncope type  Closed fracture of proximal end of fibula, unspecified fracture morphology, initial encounter     Rx / DC Orders   ED Discharge Orders     None        Note:  This document was prepared using Dragon voice recognition software and may include unintentional dictation errors.   Corena Herter, MD 10/22/22 [phone]

## 2022-11-01 ENCOUNTER — Encounter: Payer: 59 | Admitting: Family

## 2022-11-11 ENCOUNTER — Encounter (INDEPENDENT_AMBULATORY_CARE_PROVIDER_SITE_OTHER): Payer: 59 | Admitting: Vascular Surgery

## 2022-11-15 ENCOUNTER — Ambulatory Visit: Payer: 59 | Admitting: Podiatry

## 2022-12-06 ENCOUNTER — Ambulatory Visit: Payer: 59 | Admitting: Podiatry

## 2022-12-16 ENCOUNTER — Encounter (INDEPENDENT_AMBULATORY_CARE_PROVIDER_SITE_OTHER): Payer: 59 | Admitting: Vascular Surgery

## 2022-12-17 ENCOUNTER — Ambulatory Visit: Payer: 59 | Admitting: Podiatry

## 2022-12-31 ENCOUNTER — Ambulatory Visit: Payer: 59 | Admitting: Podiatry

## 2023-01-21 ENCOUNTER — Ambulatory Visit: Payer: 59 | Admitting: Podiatry

## 2023-02-11 ENCOUNTER — Ambulatory Visit: Payer: 59 | Admitting: Podiatry

## 2023-02-28 ENCOUNTER — Ambulatory Visit: Payer: 59 | Admitting: Podiatry

## 2023-03-25 ENCOUNTER — Ambulatory Visit (INDEPENDENT_AMBULATORY_CARE_PROVIDER_SITE_OTHER): Payer: 59 | Admitting: Podiatry

## 2023-03-25 ENCOUNTER — Encounter: Payer: Self-pay | Admitting: Podiatry

## 2023-03-25 DIAGNOSIS — B351 Tinea unguium: Secondary | ICD-10-CM

## 2023-03-25 DIAGNOSIS — M79676 Pain in unspecified toe(s): Secondary | ICD-10-CM

## 2023-04-02 NOTE — Progress Notes (Signed)
   Chief Complaint  Patient presents with   Nail Problem    "She needs to have her nails trimmed down."    SUBJECTIVE Patient presents to office today complaining of elongated, thickened nails that cause pain while ambulating in shoes.  Patient is unable to trim their own nails. Patient is here for further evaluation and treatment.  Past Medical History:  Diagnosis Date   Asthma    Breast cancer (HCC) 2003   LT LUMPECTOMY   Breast cancer (HCC) 2004   LT LUMPECTOMY   Bronchiectasis (HCC)    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   GERD (gastroesophageal reflux disease)    Hemorrhoids    Hypertension    Personal history of chemotherapy 2004   BREAST CA   Personal history of radiation therapy 2003   BREAST CA   Personal history of radiation therapy 2004   BREAST CA   Polyp of colon 2002   bleeding polyp of right ascending colon   PUD (peptic ulcer disease)    Seizures (HCC)    epilepsy well controlled    OBJECTIVE General Patient is awake, alert, and oriented x 3 and in no acute distress. Derm Skin is dry and supple bilateral. Negative open lesions or macerations. Remaining integument unremarkable. Nails are tender, long, thickened and dystrophic with subungual debris, consistent with onychomycosis, 1-5 bilateral. No signs of infection noted. Vasc  DP and PT pedal pulses palpable bilaterally. Temperature gradient within normal limits.  Neuro Epicritic and protective threshold sensation grossly intact bilaterally.  Musculoskeletal Exam No symptomatic pedal deformities noted bilateral. Muscular strength within normal limits.  ASSESSMENT 1.  Pain due to onychomycosis of toenails both  PLAN OF CARE 1. Patient evaluated today.  2. Instructed to maintain good pedal hygiene and foot care.  3. Mechanical debridement of nails 1-5 bilaterally performed using a nail nipper. Filed with dremel without incident.  4. Return to clinic in 3 mos.    Felecia Shelling,  DPM [facility]  Dr. Felecia Shelling, DPM    [address] [place], [place] [address]                Office [phone]  Fax [phone]

## 2023-04-10 ENCOUNTER — Other Ambulatory Visit: Payer: Self-pay

## 2023-04-10 MED ORDER — COMIRNATY 30 MCG/0.3ML IM SUSY
0.3000 mL | PREFILLED_SYRINGE | Freq: Once | INTRAMUSCULAR | 0 refills | Status: AC
  Filled 2023-04-10: qty 0.3, 1d supply, fill #0

## 2023-07-14 ENCOUNTER — Other Ambulatory Visit: Payer: Self-pay

## 2023-07-14 ENCOUNTER — Encounter: Payer: Self-pay | Admitting: Nurse Practitioner

## 2023-07-14 ENCOUNTER — Emergency Department
Admission: EM | Admit: 2023-07-14 | Discharge: 2023-07-14 | Disposition: A | Discharge status: Home / Self Care | Payer: 59 | Attending: Emergency Medicine | Admitting: Emergency Medicine

## 2023-07-14 DIAGNOSIS — K529 Noninfective gastroenteritis and colitis, unspecified: Secondary | ICD-10-CM | POA: Insufficient documentation

## 2023-07-14 DIAGNOSIS — Z20822 Contact with and (suspected) exposure to covid-19: Secondary | ICD-10-CM | POA: Diagnosis not present

## 2023-07-14 DIAGNOSIS — R112 Nausea with vomiting, unspecified: Secondary | ICD-10-CM | POA: Diagnosis present

## 2023-07-14 LAB — RESP PANEL BY RT-PCR (RSV, FLU A&B, COVID)  RVPGX2
Influenza A by PCR: NEGATIVE
Influenza B by PCR: NEGATIVE
Resp Syncytial Virus by PCR: NEGATIVE
SARS Coronavirus 2 by RT PCR: NEGATIVE

## 2023-07-14 LAB — CBC
HCT: 38.4 % (ref 36.0–46.0)
Hemoglobin: 11.6 g/dL — ABNORMAL LOW (ref 12.0–15.0)
MCH: 23.2 pg — ABNORMAL LOW (ref 26.0–34.0)
MCHC: 30.2 g/dL (ref 30.0–36.0)
MCV: 76.6 fL — ABNORMAL LOW (ref 80.0–100.0)
Platelets: 260 10*3/uL (ref 150–400)
RBC: 5.01 MIL/uL (ref 3.87–5.11)
RDW: 17.5 % — ABNORMAL HIGH (ref 11.5–15.5)
WBC: 11.5 10*3/uL — ABNORMAL HIGH (ref 4.0–10.5)
nRBC: 0 % (ref 0.0–0.2)

## 2023-07-14 LAB — COMPREHENSIVE METABOLIC PANEL
ALT: 16 U/L (ref 0–44)
AST: 17 U/L (ref 15–41)
Albumin: 3.4 g/dL — ABNORMAL LOW (ref 3.5–5.0)
Alkaline Phosphatase: 85 U/L (ref 38–126)
Anion gap: 12 (ref 5–15)
BUN: 24 mg/dL — ABNORMAL HIGH (ref 8–23)
CO2: 22 mmol/L (ref 22–32)
Calcium: 8.7 mg/dL — ABNORMAL LOW (ref 8.9–10.3)
Chloride: 105 mmol/L (ref 98–111)
Creatinine, Ser: 0.75 mg/dL (ref 0.44–1.00)
GFR, Estimated: >60 mL/min (ref >60)
Glucose, Bld: 140 mg/dL — ABNORMAL HIGH (ref 70–99)
Potassium: 3.9 mmol/L (ref 3.5–5.1)
Sodium: 139 mmol/L (ref 135–145)
Total Bilirubin: 0.5 mg/dL (ref 0.0–1.2)
Total Protein: 6.8 g/dL (ref 6.5–8.1)

## 2023-07-14 MED ORDER — ONDANSETRON HCL 4 MG/2ML IJ SOLN
4.0000 mg | Freq: Once | INTRAMUSCULAR | Status: AC
  Administered 2023-07-14: 4 mg via INTRAVENOUS
  Filled 2023-07-14: qty 2

## 2023-07-14 MED ORDER — ONDANSETRON 4 MG PO TBDP: 4.0000 mg | ORAL_TABLET | Freq: Three times a day (TID) | ORAL | 0 refills | Status: DC | PRN

## 2023-07-14 MED ORDER — ONDANSETRON 4 MG PO TBDP: 4.0000 mg | ORAL_TABLET | Freq: Three times a day (TID) | ORAL | 0 refills | Status: AC | PRN

## 2023-07-14 MED ORDER — SODIUM CHLORIDE 0.9 % IV BOLUS
1000.0000 mL | Freq: Once | INTRAVENOUS | Status: AC
Start: 2023-07-14 — End: 2023-07-14
  Administered 2023-07-14: 1000 mL via INTRAVENOUS

## 2023-07-14 NOTE — ED Provider Notes (Signed)
 [facility] Provider Note    Event Date/Time   First MD Initiated Contact with Patient 07/14/23 1940     (approximate)  History   Chief Complaint: Diarrhea  HPI  MEILAH DELROSARIO is a 89 y.o. female with a past medical history of asthma, gastric reflux, presents to the emergency department for nausea vomiting and diarrhea.  According to the daughter over the weekend she came down with a GI illness for approximately 24 to 48 hours and then was feeling better.  She states this morning her mother (the patient) began experiencing GI illnesses with nausea and diarrhea.  Multiple episodes of diarrhea and then had an episode of vomiting in the emergency department.  No fever.  Patient is awake alert denies any symptoms currently.  Denies any abdominal pain.   Physical Exam   Triage Vital Signs: ED Triage Vitals  Encounter Vitals Group     BP 07/14/23 1216 119/87     Systolic BP Percentile --      Diastolic BP Percentile --      Pulse Rate 07/14/23 1216 80     Resp 07/14/23 1216 20     Temp 07/14/23 1216 97.9 F (36.6 C)     Temp Source 07/14/23 1216 Oral     SpO2 07/14/23 1216 95 %     Weight 07/14/23 1214 200 lb (90.7 kg)     Height 07/14/23 1214 5' 4 (1.626 m)     Head Circumference --      Peak Flow --      Pain Score 07/14/23 1214 0     Pain Loc --      Pain Education --      Exclude from Growth Chart --     Most recent vital signs: Vitals:   07/14/23 1907 07/14/23 1933  BP: (!) 117/52 100/63  Pulse: 80 80  Resp: (!) 21 (!) 21  Temp: 98.3 F (36.8 C)   SpO2: 99% 98%    General: Awake, no distress.  CV:  Good peripheral perfusion.  Regular rate and rhythm  Resp:  Normal effort.  Equal breath sounds bilaterally.  Abd:  No distention.  Soft, nontender.  No rebound or guarding.  Benign abdomen.  ED Results / Procedures / Treatments   MEDICATIONS ORDERED IN ED: Medications  sodium chloride  0.9 % bolus 1,000 mL (has no administration in time  range)  ondansetron  (ZOFRAN ) injection 4 mg (has no administration in time range)     IMPRESSION / MDM / ASSESSMENT AND PLAN / ED COURSE  I reviewed the triage vital signs and the nursing notes.  Patient's presentation is most consistent with acute presentation with potential threat to life or bodily function.  Patient presents to the emergency department for nausea vomiting diarrhea starting today, daughter was sick with similar illness over the weekend.  Patient has a benign abdomen, reassuring physical exam reassuring vital signs.  Patient CBC overall reassuring, chemistry shows no significant finding, anion gap of 12, COVID/flu/RSV is negative.  Patient symptoms are very suggestive of a GI illness such as norovirus.  Given the patient's age we will IV hydrate and treat with IV Zofran .  Will attempt to obtain a stool sample given the significant diarrhea for norovirus testing as well as C. difficile testing.  Patient states she is feeling somewhat better.  Has received fluids and Zofran .  Has not been able to provide a stool sample.  Patient's lab work is reassuring.  Given the  patient's reassuring workup we will discharge home suspect gastroenteritis.  Will discharge with Zofran  to be used as needed discussed supportive care.  Daughter agreeable to plan.  FINAL CLINICAL IMPRESSION(S) / ED DIAGNOSES   Gastroenteritis    Note:  This document was prepared using Dragon voice recognition software and may include unintentional dictation errors.   Dorothyann Drivers, MD 07/14/23 2248

## 2023-07-14 NOTE — ED Triage Notes (Signed)
 First nurse note:  Pt to ED [facility] from home for chills, diarrhea started this am. Pt has pacemaker. VSS with ems.

## 2023-07-14 NOTE — Discharge Instructions (Signed)
 As we discussed please use Zofran as needed for nausea as prescribed.  Please encourage plenty of fluids and rest.  Return to the emergency department for any concerns of dehydration or any other symptom personally concerning to yourself.

## 2023-08-26 ENCOUNTER — Encounter: Payer: Self-pay | Admitting: Nurse Practitioner

## 2023-09-09 ENCOUNTER — Ambulatory Visit: Payer: 59 | Admitting: Podiatry

## 2023-09-26 ENCOUNTER — Ambulatory Visit: Admitting: Podiatry

## 2023-10-21 ENCOUNTER — Ambulatory Visit: Admitting: Podiatry

## 2023-10-28 ENCOUNTER — Ambulatory Visit (INDEPENDENT_AMBULATORY_CARE_PROVIDER_SITE_OTHER): Admitting: Podiatry

## 2023-10-28 ENCOUNTER — Encounter: Payer: Self-pay | Admitting: Podiatry

## 2023-10-28 DIAGNOSIS — M79676 Pain in unspecified toe(s): Secondary | ICD-10-CM

## 2023-10-28 DIAGNOSIS — E119 Type 2 diabetes mellitus without complications: Secondary | ICD-10-CM

## 2023-10-28 DIAGNOSIS — B351 Tinea unguium: Secondary | ICD-10-CM | POA: Diagnosis not present

## 2023-10-28 NOTE — Progress Notes (Signed)
   Chief Complaint  Patient presents with   Nail Problem    "This is her six month follow-up.  Trim her nails and check her feet."    SUBJECTIVE Patient presents to office today complaining of elongated, thickened nails that cause pain while ambulating in shoes.  Patient is unable to trim their own nails. Patient is here for further evaluation and treatment.  Past Medical History:  Diagnosis Date   Asthma    Breast cancer (HCC) 2003   LT LUMPECTOMY   Breast cancer (HCC) 2004   LT LUMPECTOMY   Bronchiectasis (HCC)    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   GERD (gastroesophageal reflux disease)    Hemorrhoids    Hypertension    Personal history of chemotherapy 2004   BREAST CA   Personal history of radiation therapy 2003   BREAST CA   Personal history of radiation therapy 2004   BREAST CA   Polyp of colon 2002   bleeding polyp of right ascending colon   PUD (peptic ulcer disease)    Seizures (HCC)    epilepsy well controlled    OBJECTIVE General Patient is awake, alert, and oriented x 3 and in no acute distress. Derm Skin is dry and supple bilateral. Negative open lesions or macerations. Remaining integument unremarkable. Nails are tender, long, thickened and dystrophic with subungual debris, consistent with onychomycosis, 1-5 bilateral. No signs of infection noted. Vasc  DP and PT pedal pulses palpable bilaterally. Temperature gradient within normal limits.  Neuro light touch and protective threshold sensation grossly intact bilaterally.  Musculoskeletal Exam No symptomatic pedal deformities noted bilateral. Muscular strength within normal limits.  ASSESSMENT 1.  Pain due to onychomycosis of toenails both  PLAN OF CARE 1. Patient evaluated today.  Comprehensive diabetic foot exam performed today 2. Instructed to maintain good pedal hygiene and foot care.  3. Mechanical debridement of nails 1-5 bilaterally performed using a nail nipper. Filed with dremel  without incident.  4. Return to clinic in 3 mos.    Dot Gazella, DPM [facility]  Dr. Dot Gazella, DPM    [address] [place], [place] [address]                Office [phone]  Fax [phone]

## 2023-11-18 ENCOUNTER — Encounter (INDEPENDENT_AMBULATORY_CARE_PROVIDER_SITE_OTHER): Payer: Self-pay

## 2024-02-27 ENCOUNTER — Ambulatory Visit: Admitting: Podiatry

## 2024-04-13 ENCOUNTER — Ambulatory Visit: Admitting: Podiatry

## 2024-05-21 ENCOUNTER — Ambulatory Visit: Admitting: Podiatry

## 2024-06-15 ENCOUNTER — Ambulatory Visit: Admitting: Podiatry

## 2024-07-13 ENCOUNTER — Ambulatory Visit (INDEPENDENT_AMBULATORY_CARE_PROVIDER_SITE_OTHER): Admitting: Podiatry

## 2024-07-13 DIAGNOSIS — M79676 Pain in unspecified toe(s): Secondary | ICD-10-CM | POA: Diagnosis not present

## 2024-07-13 DIAGNOSIS — B351 Tinea unguium: Secondary | ICD-10-CM

## 2024-07-13 NOTE — Progress Notes (Signed)
" ° °  Chief Complaint  Patient presents with   Nail Problem    RFC. She states her skin is super dry also    SUBJECTIVE Patient presents to office today complaining of elongated, thickened nails that cause pain while ambulating in shoes.  Patient is unable to trim their own nails. Patient is here for further evaluation and treatment.  Past Medical History:  Diagnosis Date   Asthma    Breast cancer (HCC) 2003   LT LUMPECTOMY   Breast cancer (HCC) 2004   LT LUMPECTOMY   Bronchiectasis (HCC)    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   GERD (gastroesophageal reflux disease)    Hemorrhoids    Hypertension    Personal history of chemotherapy 2004   BREAST CA   Personal history of radiation therapy 2003   BREAST CA   Personal history of radiation therapy 2004   BREAST CA   Polyp of colon 2002   bleeding polyp of right ascending colon   PUD (peptic ulcer disease)    Seizures (HCC)    epilepsy well controlled    OBJECTIVE General Patient is awake, alert, and oriented x 3 and in no acute distress. Derm Skin is dry and supple bilateral. Negative open lesions or macerations. Remaining integument unremarkable. Nails are tender, long, thickened and dystrophic with subungual debris, consistent with onychomycosis, 1-5 bilateral. No signs of infection noted. Vasc  DP and PT pedal pulses palpable bilaterally. Temperature gradient within normal limits.  Neuro light touch and protective threshold sensation grossly intact bilaterally.  Musculoskeletal Exam No symptomatic pedal deformities noted bilateral. Muscular strength within normal limits.  ASSESSMENT 1.  Pain due to onychomycosis of toenails both  PLAN OF CARE 1. Patient evaluated today.  Comprehensive diabetic foot exam performed today 2. Instructed to maintain good pedal hygiene and foot care.  3. Mechanical debridement of nails 1-5 bilaterally performed using a nail nipper. Filed with dremel without incident.  4. Return  to clinic in 3 mos.    Thresa EMERSON Sar, DPM [facility]  Dr. Thresa EMERSON Sar, DPM    [address] [place], [place] [address]                Office [phone]  Fax [phone]    "

## 2025-01-11 ENCOUNTER — Ambulatory Visit: Admitting: Podiatry
